# Patient Record
Sex: Female | Born: 1955
Health system: Southern US, Community
[De-identification: ages and names within clinical notes are randomized; demographics above are authoritative.]

## PROBLEM LIST (undated history)

## (undated) DIAGNOSIS — E785 Hyperlipidemia, unspecified: Secondary | ICD-10-CM

## (undated) DIAGNOSIS — I214 Non-ST elevation (NSTEMI) myocardial infarction: Secondary | ICD-10-CM

## (undated) DIAGNOSIS — J40 Bronchitis, not specified as acute or chronic: Secondary | ICD-10-CM

## (undated) DIAGNOSIS — I10 Essential (primary) hypertension: Secondary | ICD-10-CM

## (undated) DIAGNOSIS — G5600 Carpal tunnel syndrome, unspecified upper limb: Secondary | ICD-10-CM

## (undated) DIAGNOSIS — M1990 Unspecified osteoarthritis, unspecified site: Secondary | ICD-10-CM

## (undated) DIAGNOSIS — K219 Gastro-esophageal reflux disease without esophagitis: Secondary | ICD-10-CM

## (undated) DIAGNOSIS — I2510 Atherosclerotic heart disease of native coronary artery without angina pectoris: Secondary | ICD-10-CM

## (undated) DIAGNOSIS — T7840XA Allergy, unspecified, initial encounter: Secondary | ICD-10-CM

## (undated) HISTORY — PX: TONSILLECTOMY: SUR1361

## (undated) HISTORY — DX: Allergy, unspecified, initial encounter: T78.40XA

## (undated) HISTORY — DX: Hyperlipidemia, unspecified: E78.5

## (undated) HISTORY — DX: Bronchitis, not specified as acute or chronic: J40

## (undated) HISTORY — PX: TUBAL LIGATION: SHX77

## (undated) HISTORY — DX: Gastro-esophageal reflux disease without esophagitis: K21.9

## (undated) HISTORY — PX: WRIST SURGERY: SHX841

## (undated) HISTORY — DX: Essential (primary) hypertension: I10

## (undated) HISTORY — DX: Non-ST elevation (NSTEMI) myocardial infarction: I21.4

---

## 2006-07-05 ENCOUNTER — Emergency Department (HOSPITAL_COMMUNITY): Admission: EM | Admit: 2006-07-05 | Discharge: 2006-07-05 | Payer: Self-pay | Admitting: Family Medicine

## 2007-06-14 ENCOUNTER — Emergency Department (HOSPITAL_COMMUNITY): Admission: EM | Admit: 2007-06-14 | Discharge: 2007-06-14 | Payer: Self-pay | Admitting: Emergency Medicine

## 2007-06-14 IMAGING — CR DG CHEST 2V
2 series · 2 of 2 positions shown · non-contrast
Comparison: none

CLINICAL DATA: Cough and sore throat.
 CHEST - 2 VIEW:

[w chest lat]
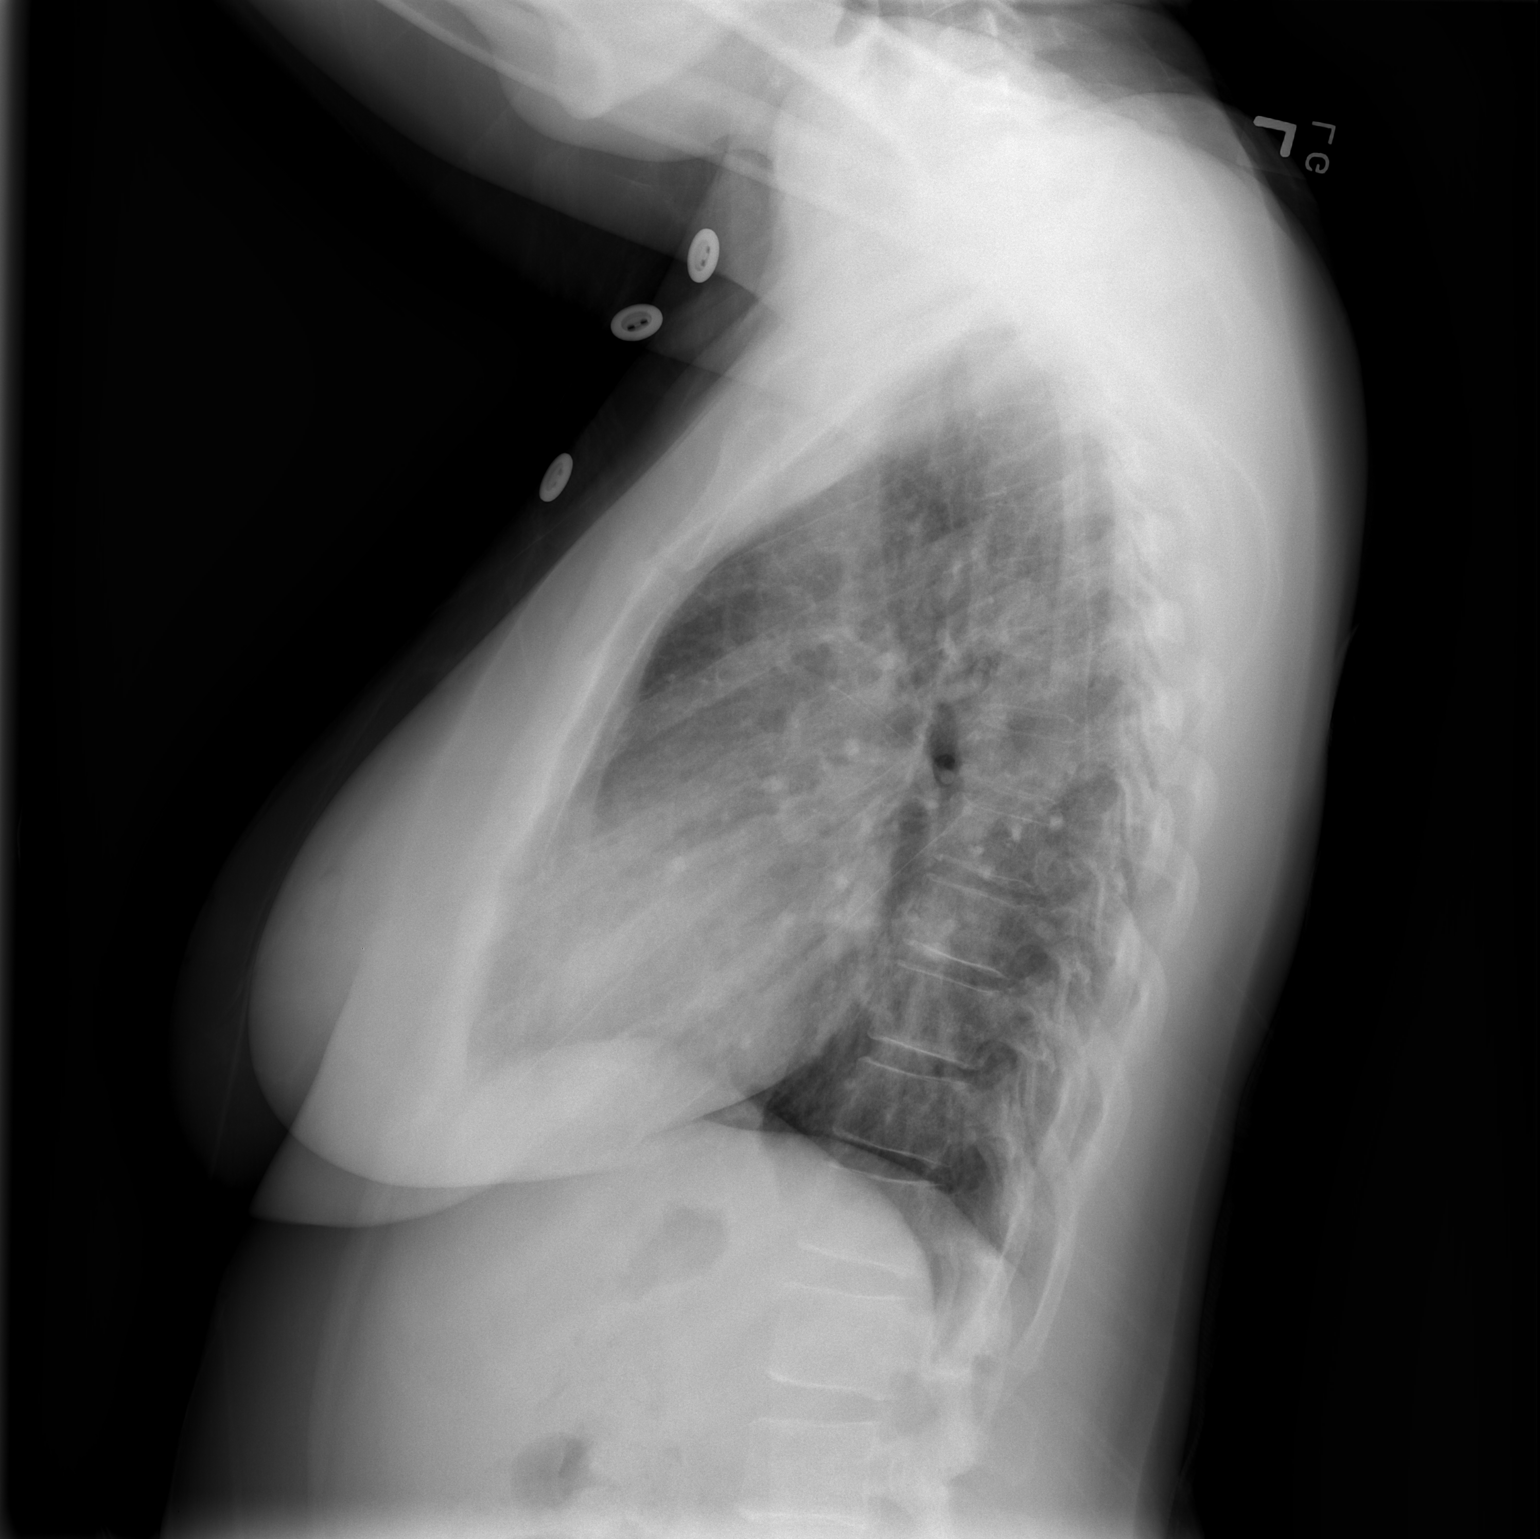

[w chest pa]
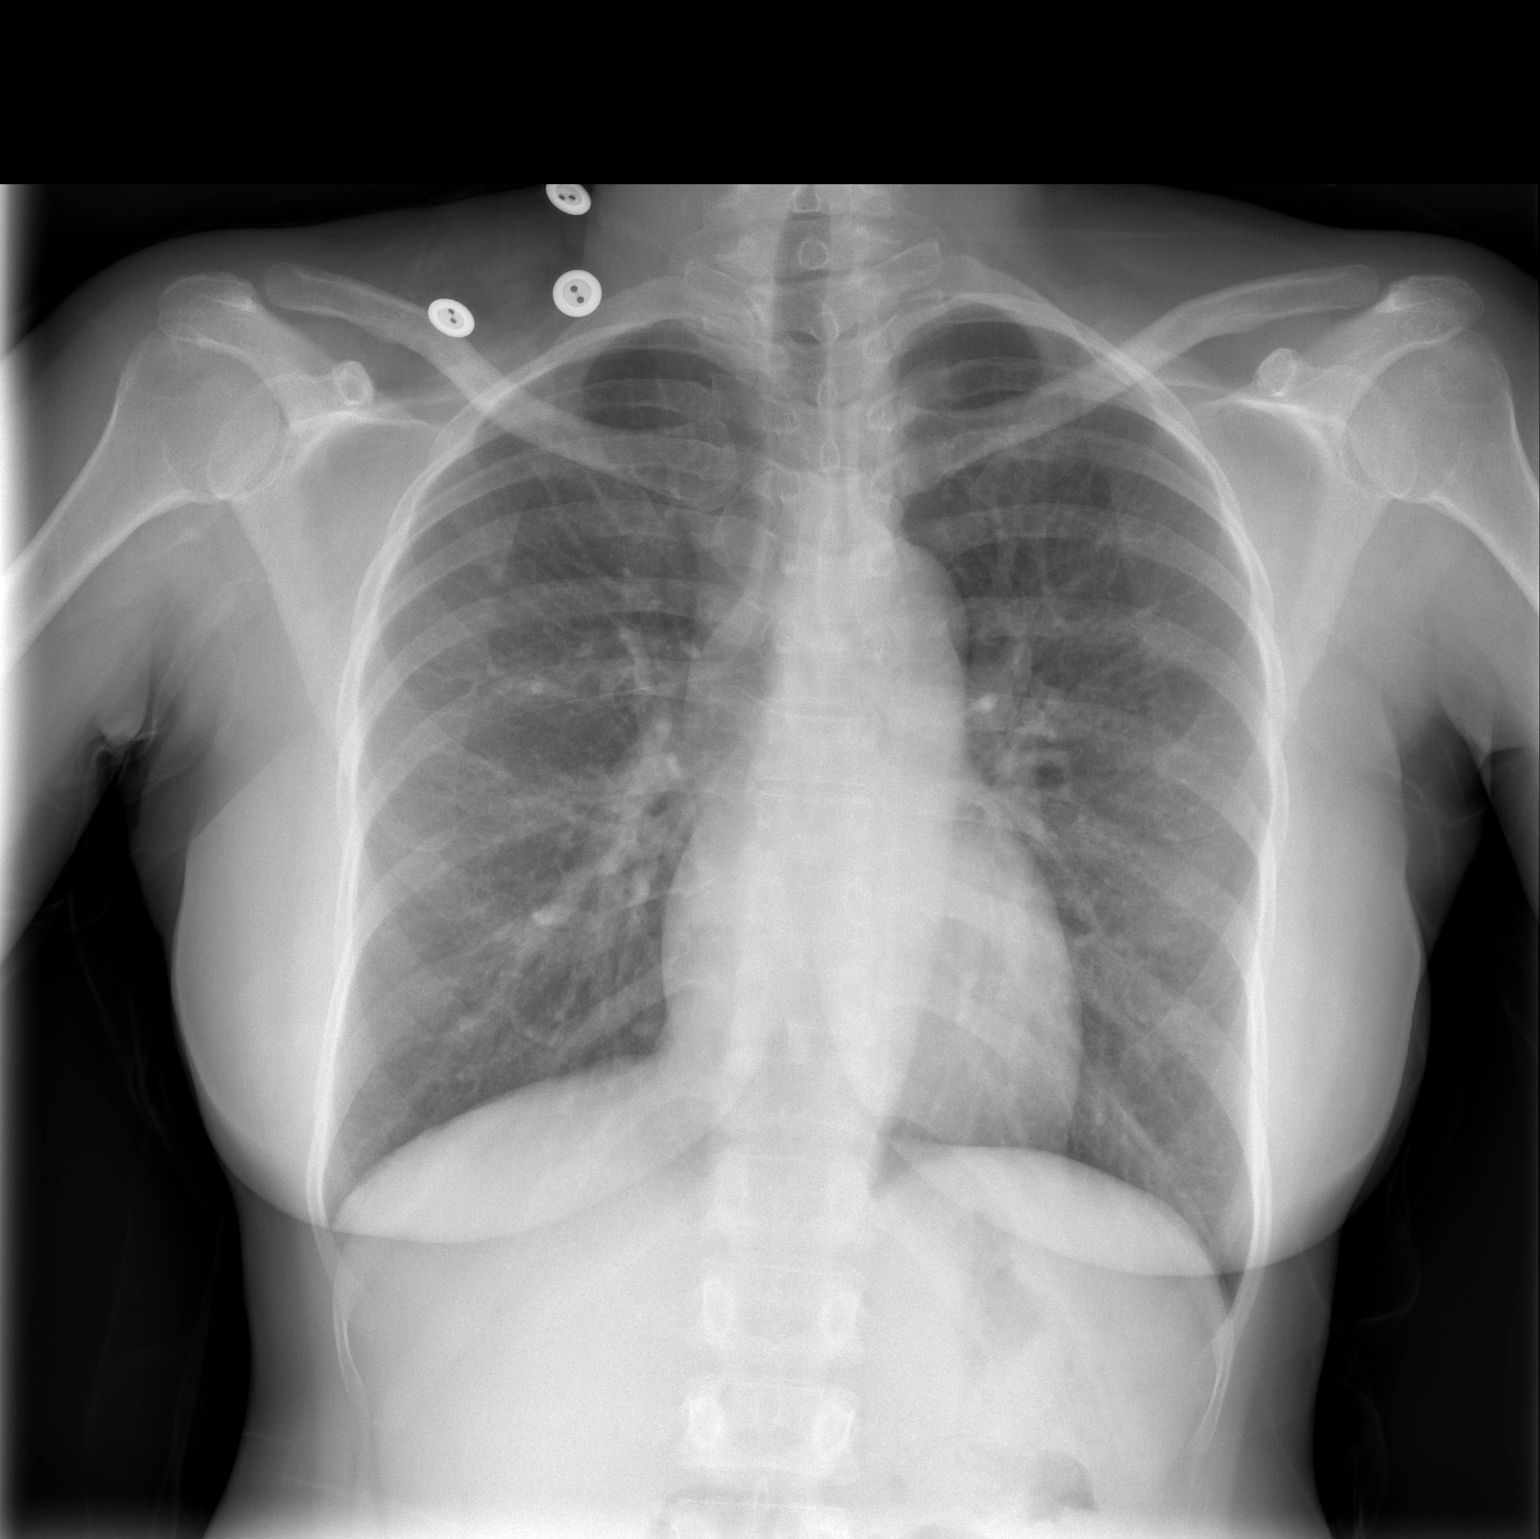

[2 of 2 positions shown; findings below may reference images not displayed]

FINDINGS: The heart size and mediastinal contours are within normal limits.  Both lungs are clear.  The visualized skeletal structures are unremarkable.
IMPRESSION: No active cardiopulmonary disease.

## 2008-04-19 ENCOUNTER — Emergency Department (HOSPITAL_COMMUNITY): Admission: EM | Admit: 2008-04-19 | Discharge: 2008-04-19 | Payer: Self-pay | Admitting: Emergency Medicine

## 2008-04-19 IMAGING — CT CT HEAD W/O CM
1 series · 15 of 30 positions shown, 19 images · non-contrast
Comparison: None

CLINICAL DATA: Headache for 1 week

CT HEAD WITHOUT CONTRAST
TECHNIQUE: Contiguous axial images were obtained from the base of
the skull through the vertex without contrast.

[Series 2: head_seq 4.5 h37s st · axial · 0.43mm/px · z∈[-151,-25]mm · 15 of 32 slices shown, 19 images]
[im 2/32  brain]
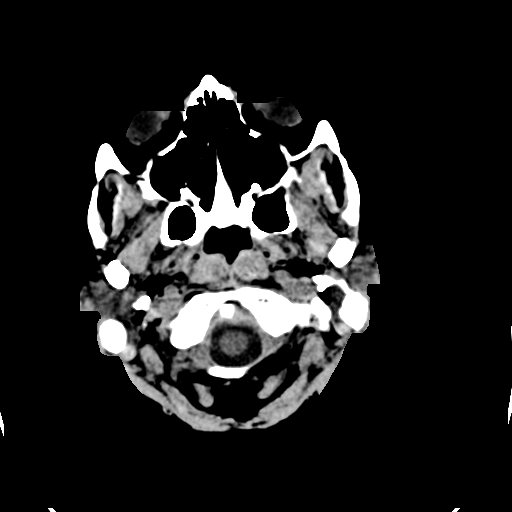
[im 2/32  bone]
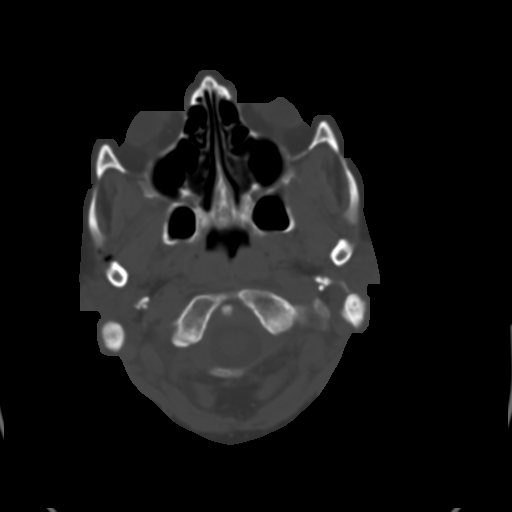
[im 4/32  brain]
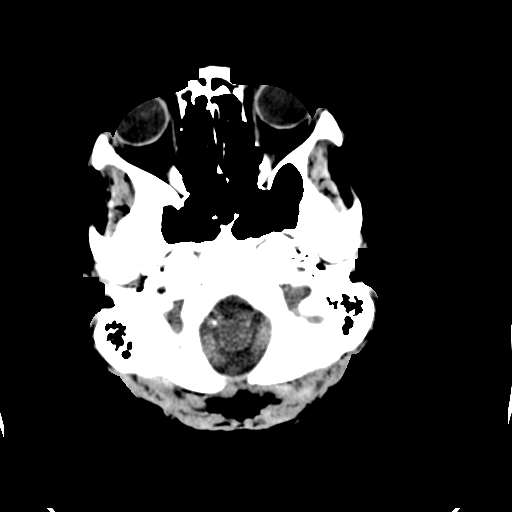
[im 6/32  brain]
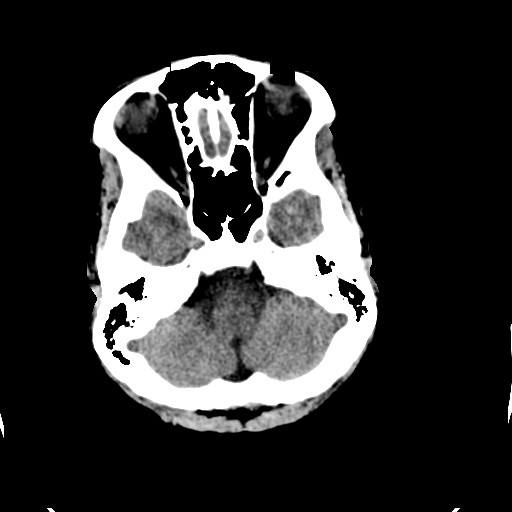
[im 8/32  brain]
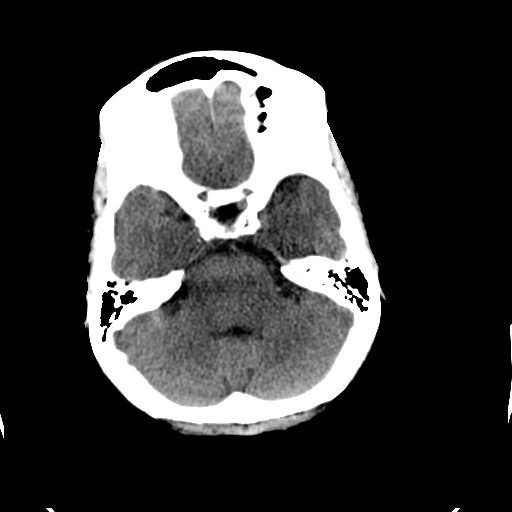
[im 10/32  brain]
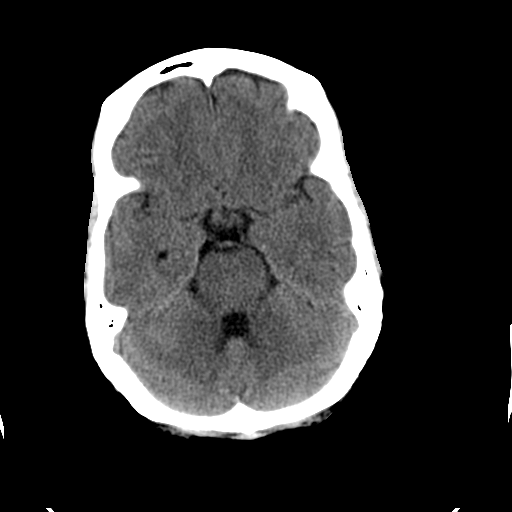
[im 10/32  bone]
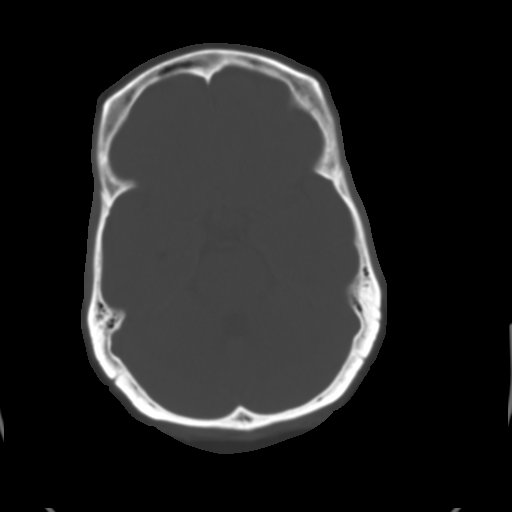
[im 12/32  brain]
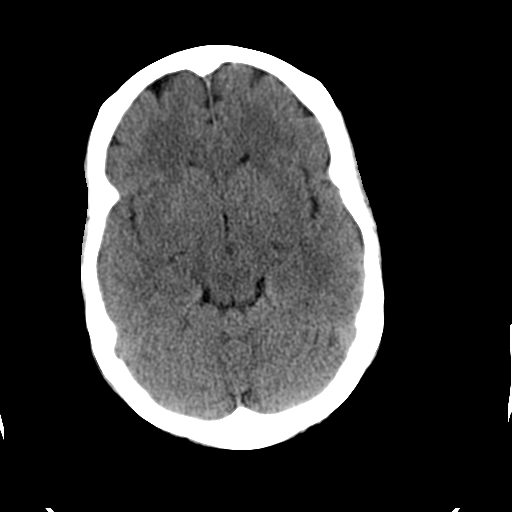
[im 14/32  brain]
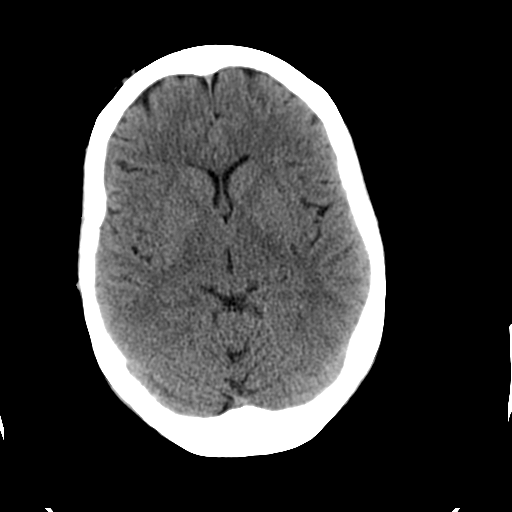
[im 17/32  brain]
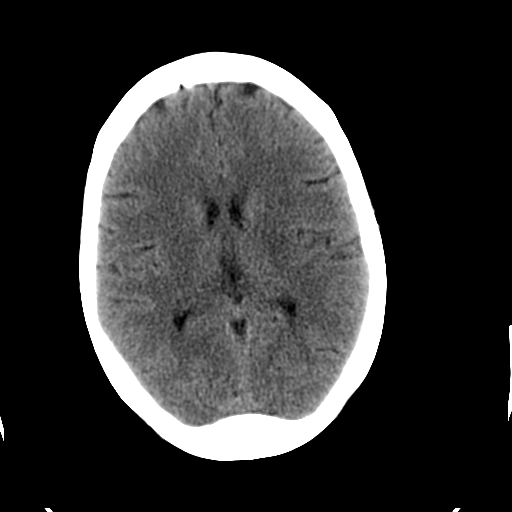
[im 18/32  brain]
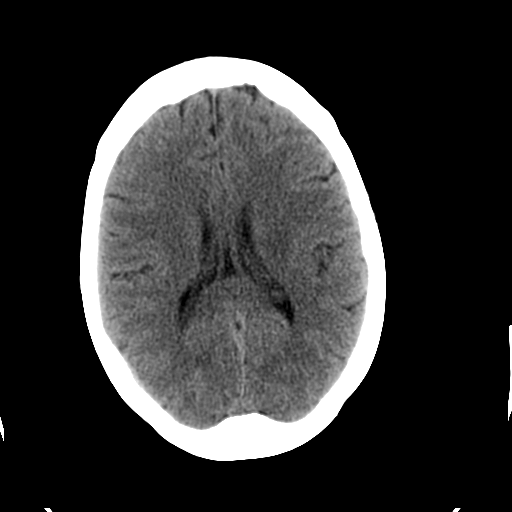
[im 18/32  bone]
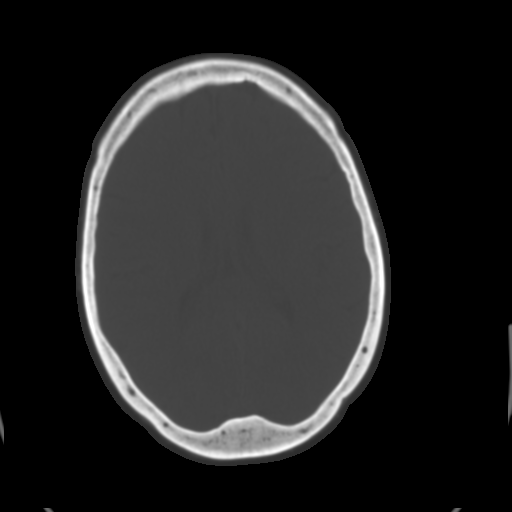
[im 20/32  brain]
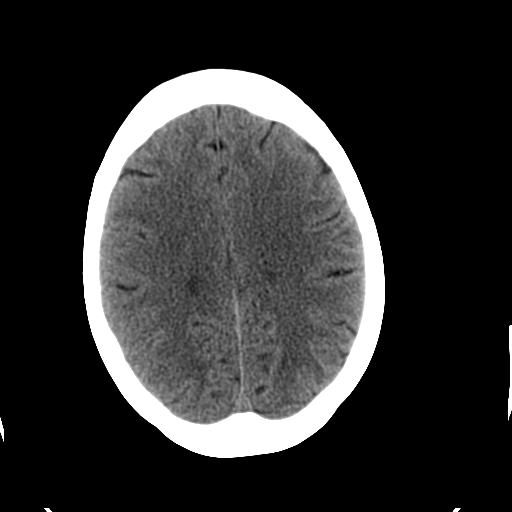
[im 22/32  brain]
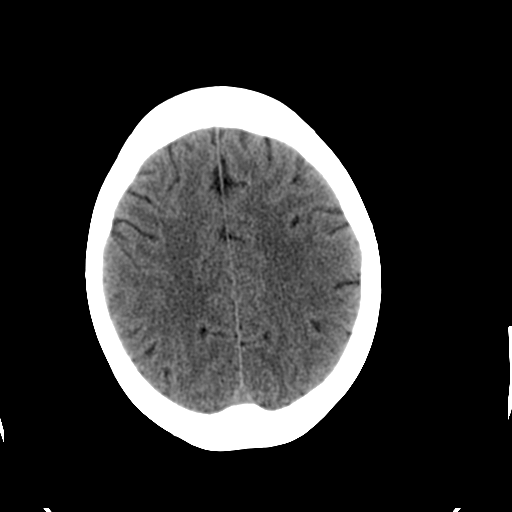
[im 24/32  brain]
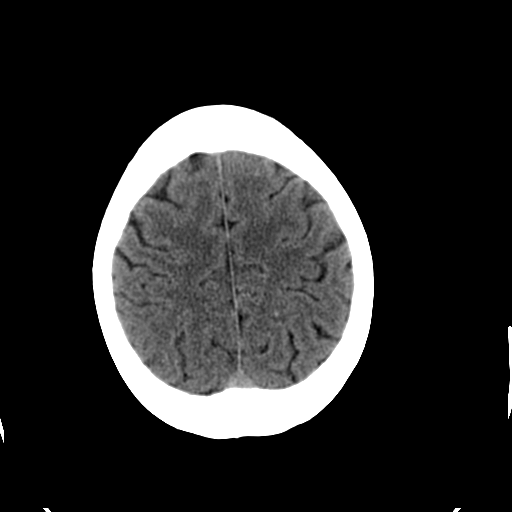
[im 26/32  brain]
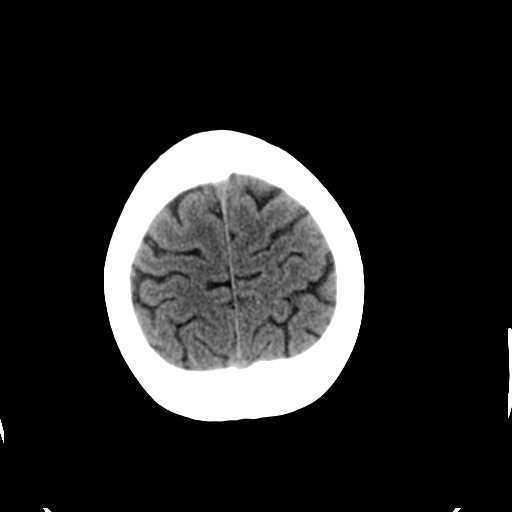
[im 26/32  bone]
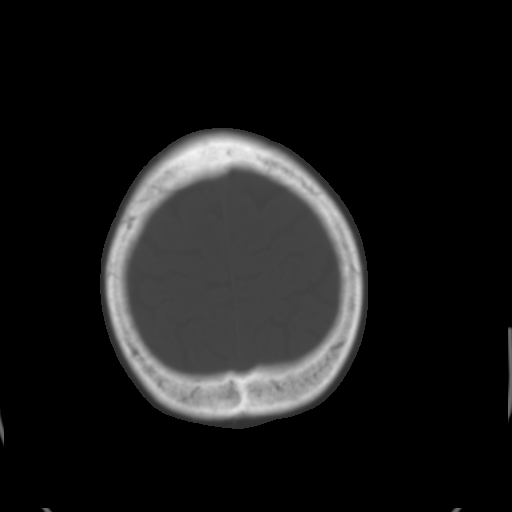
[im 28/32  brain]
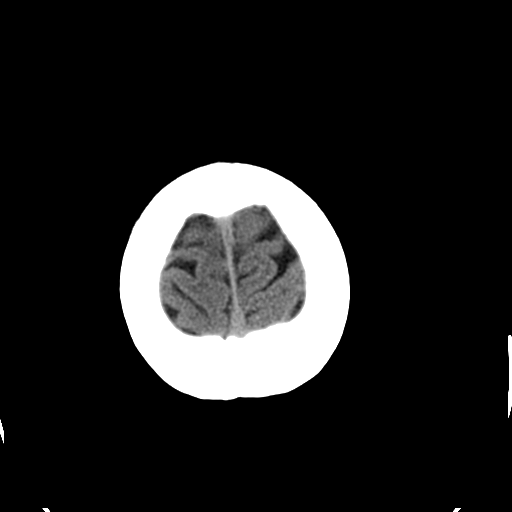
[im 30/32  brain]
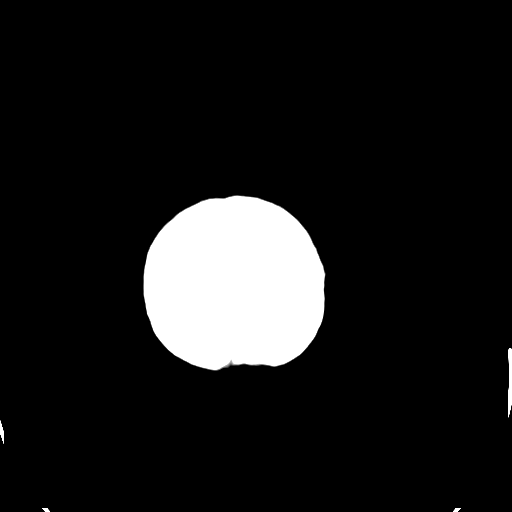

[15 of 30 positions shown; findings below may reference images not displayed]

FINDINGS: No depressed skull fracture is seen.  No intracranial
hemorrhage, mass effect or midline shift.  There is multifocal
small amount of soft tissue air in the right maseter region and
adjacent to right mandible.  A small amount of air is noted
adjacent to right sellar region.  This may be intravascular air
from recent IV access.  If there is history of recent trauma this
may represent air migrated from soft tissue neck or mediastinum.
Clinical correlation is necessary.  Tiny bilateral basal ganglia
calcifications are noted physiologic in nature.  No hydrocephalus.
No intra or extra-axial fluid collection.
IMPRESSION: 1.  No intracranial hemorrhage, mass effect or midline shift.There
is multifocal small amount of soft tissue air in the right maseter
region and adjacent to right mandible.  A small amount of air is
noted adjacent to right sellar region.  This may be intravascular
air from recent IV access.  If there is history of recent trauma
this may represent air migrated from soft tissue neck or
mediastinum.  Clinical correlation is necessary.

## 2008-06-28 ENCOUNTER — Emergency Department (HOSPITAL_COMMUNITY): Admission: EM | Admit: 2008-06-28 | Discharge: 2008-06-28 | Payer: Self-pay | Admitting: Family Medicine

## 2008-08-15 ENCOUNTER — Emergency Department (HOSPITAL_COMMUNITY): Admission: EM | Admit: 2008-08-15 | Discharge: 2008-08-15 | Payer: Self-pay | Admitting: Emergency Medicine

## 2009-06-16 ENCOUNTER — Emergency Department (HOSPITAL_COMMUNITY): Admission: EM | Admit: 2009-06-16 | Discharge: 2009-06-16 | Payer: Self-pay | Admitting: Emergency Medicine

## 2010-08-26 ENCOUNTER — Ambulatory Visit
Admission: RE | Admit: 2010-08-26 | Discharge: 2010-08-26 | Disposition: A | Discharge status: Home / Self Care | Payer: Worker's Compensation | Source: Ambulatory Visit | Attending: Emergency Medicine | Admitting: Emergency Medicine

## 2010-08-26 ENCOUNTER — Other Ambulatory Visit: Payer: Self-pay | Admitting: Emergency Medicine

## 2010-08-26 DIAGNOSIS — R52 Pain, unspecified: Secondary | ICD-10-CM

## 2010-08-26 IMAGING — CR DG HAND COMPLETE 3+V*L*
3 series · 3 of 3 positions shown · non-contrast
Comparison: None.

CLINICAL DATA: Injured hand 3 days ago with pain

LEFT HAND - COMPLETE 3+ VIEW

[view not recorded (1 of 3)]
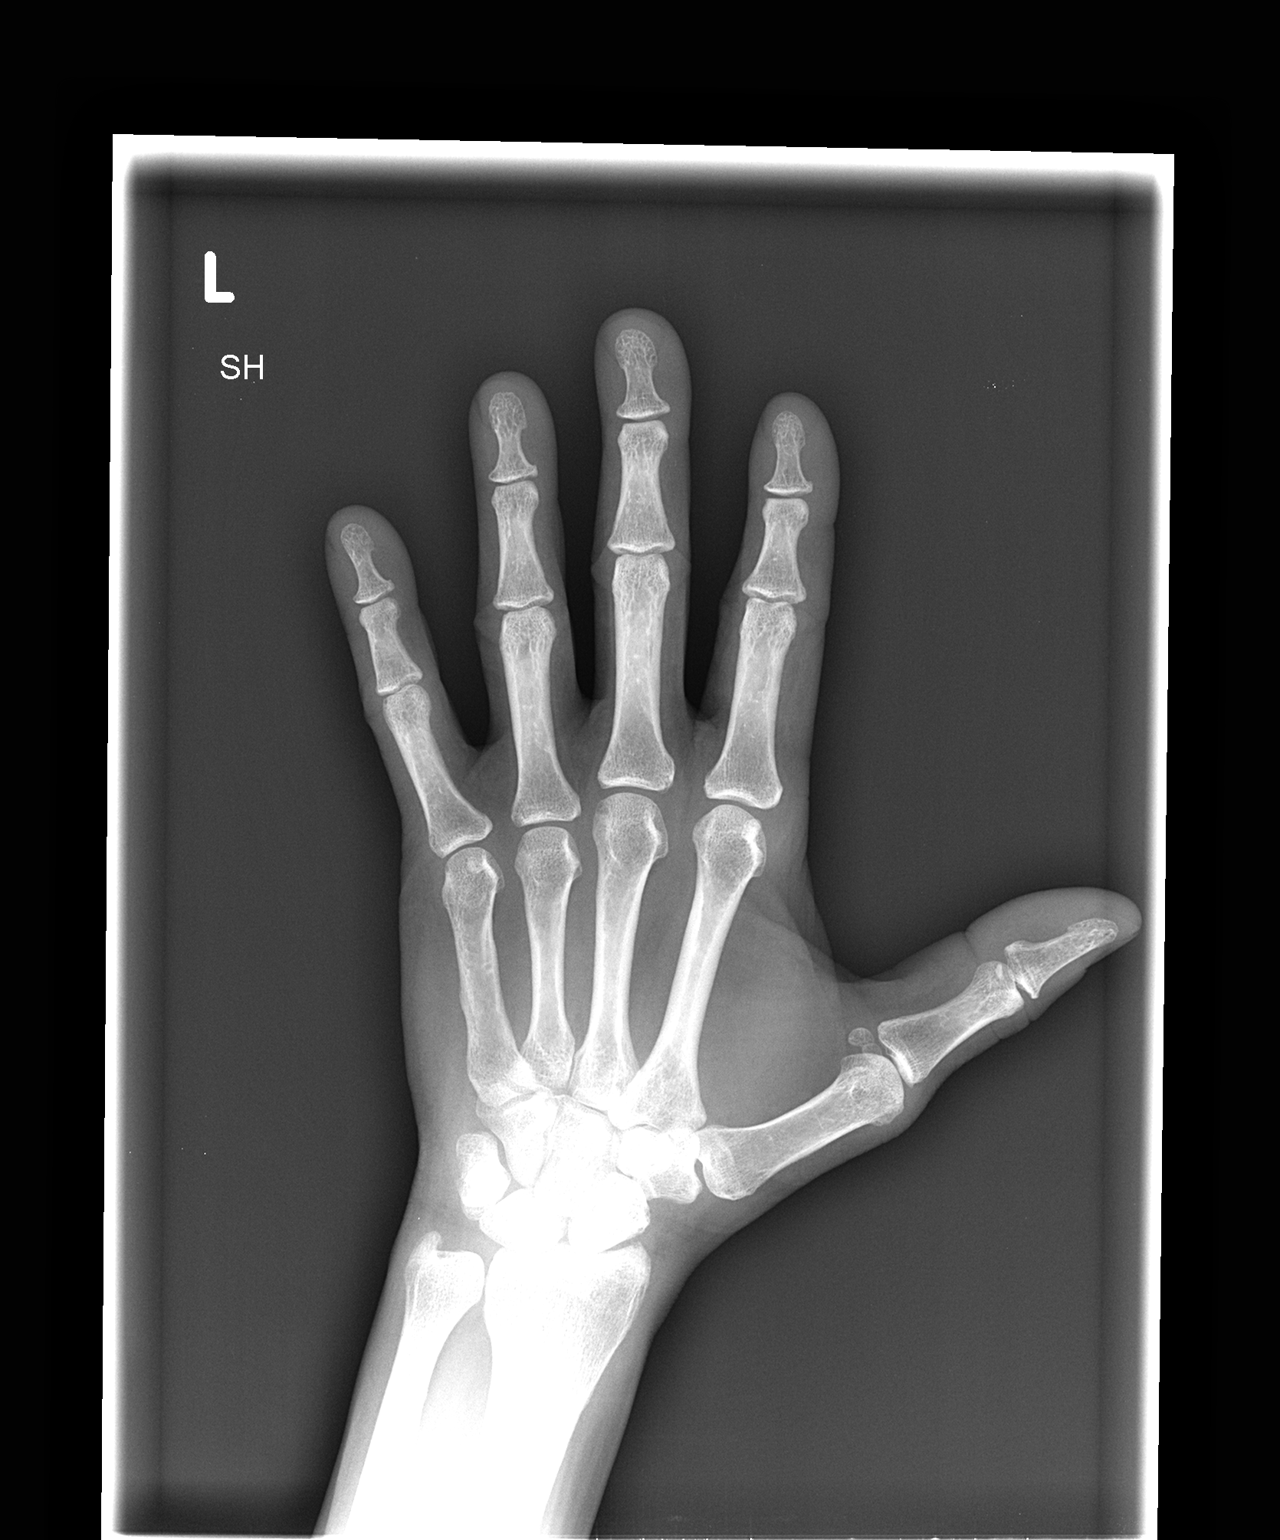

[view not recorded (2 of 3)]
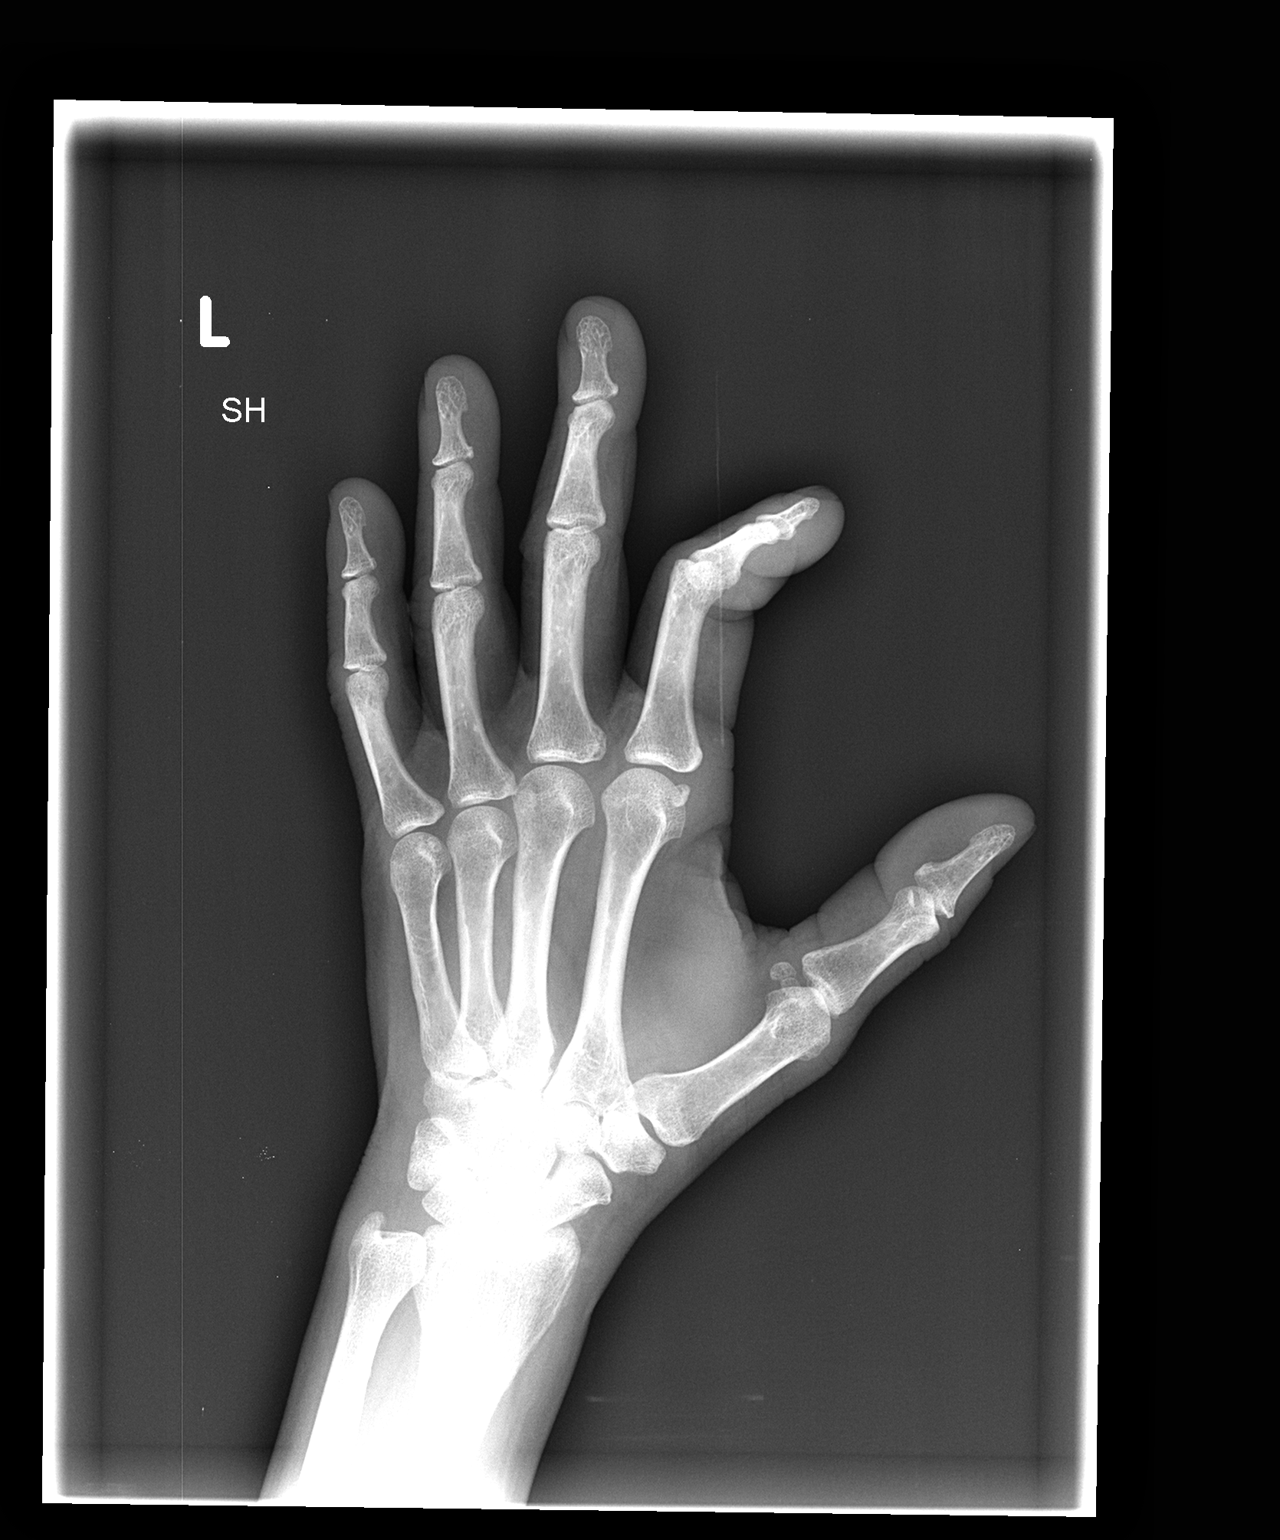

[view not recorded (3 of 3)]
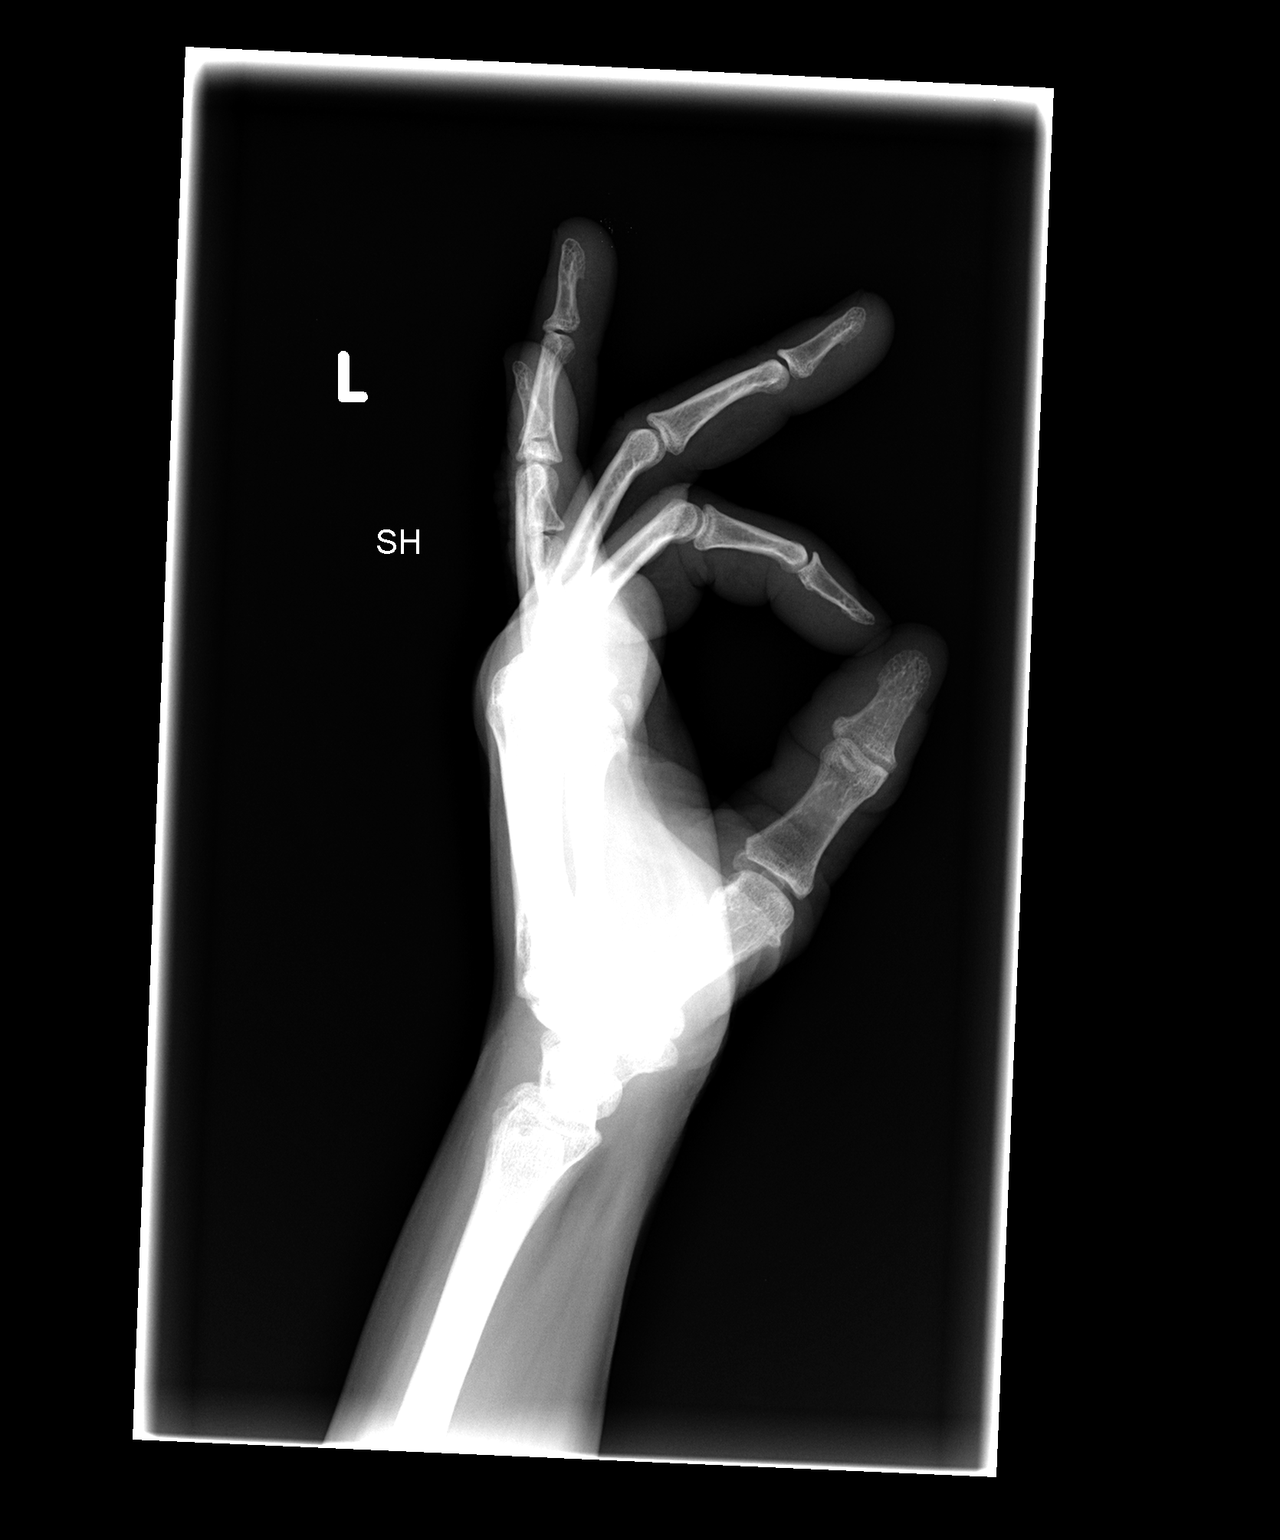

[3 of 3 positions shown; findings below may reference images not displayed]

FINDINGS: No acute fracture is seen.  The radiocarpal joint space
appears normal.  The carpal bones are in normal position.  MCP,
PIP, and DIP joints appear normal.
IMPRESSION: Negative left hand.

## 2010-11-04 ENCOUNTER — Emergency Department (HOSPITAL_COMMUNITY): Payer: BC Managed Care – PPO

## 2010-11-04 ENCOUNTER — Emergency Department (HOSPITAL_COMMUNITY)
Admission: EM | Admit: 2010-11-04 | Discharge: 2010-11-04 | Disposition: A | Discharge status: Home / Self Care | Payer: BC Managed Care – PPO | Attending: Emergency Medicine | Admitting: Emergency Medicine

## 2010-11-04 DIAGNOSIS — R05 Cough: Secondary | ICD-10-CM | POA: Insufficient documentation

## 2010-11-04 DIAGNOSIS — J069 Acute upper respiratory infection, unspecified: Secondary | ICD-10-CM | POA: Insufficient documentation

## 2010-11-04 DIAGNOSIS — J40 Bronchitis, not specified as acute or chronic: Secondary | ICD-10-CM | POA: Insufficient documentation

## 2010-11-04 DIAGNOSIS — R059 Cough, unspecified: Secondary | ICD-10-CM | POA: Insufficient documentation

## 2010-11-04 DIAGNOSIS — J029 Acute pharyngitis, unspecified: Secondary | ICD-10-CM | POA: Insufficient documentation

## 2010-11-04 IMAGING — CR DG CHEST 2V
2 series · 2 of 2 positions shown · non-contrast
Comparison: [DATE]

CLINICAL DATA: Cough, sore throat.

CHEST - 2 VIEW

[w chest pa]
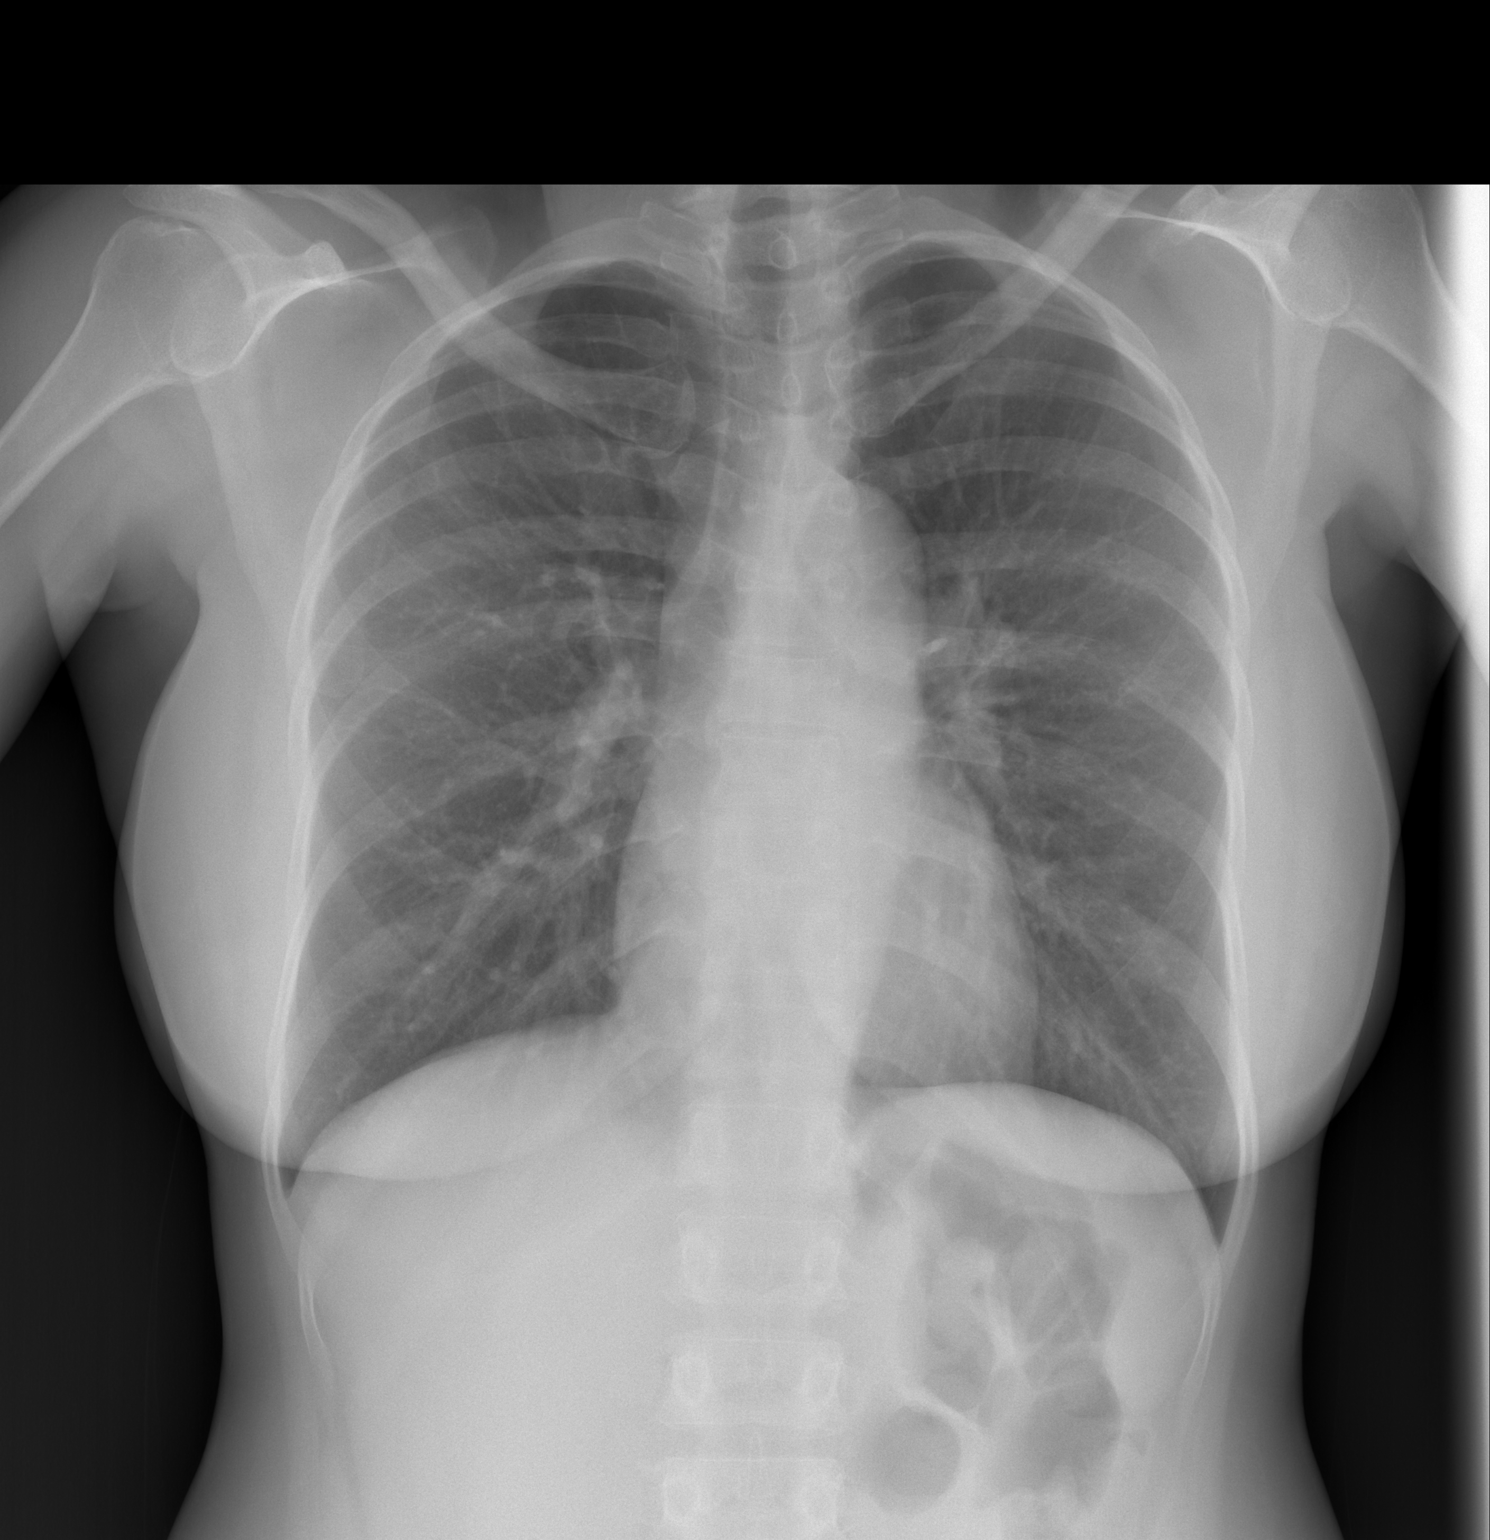

[w chest lat]
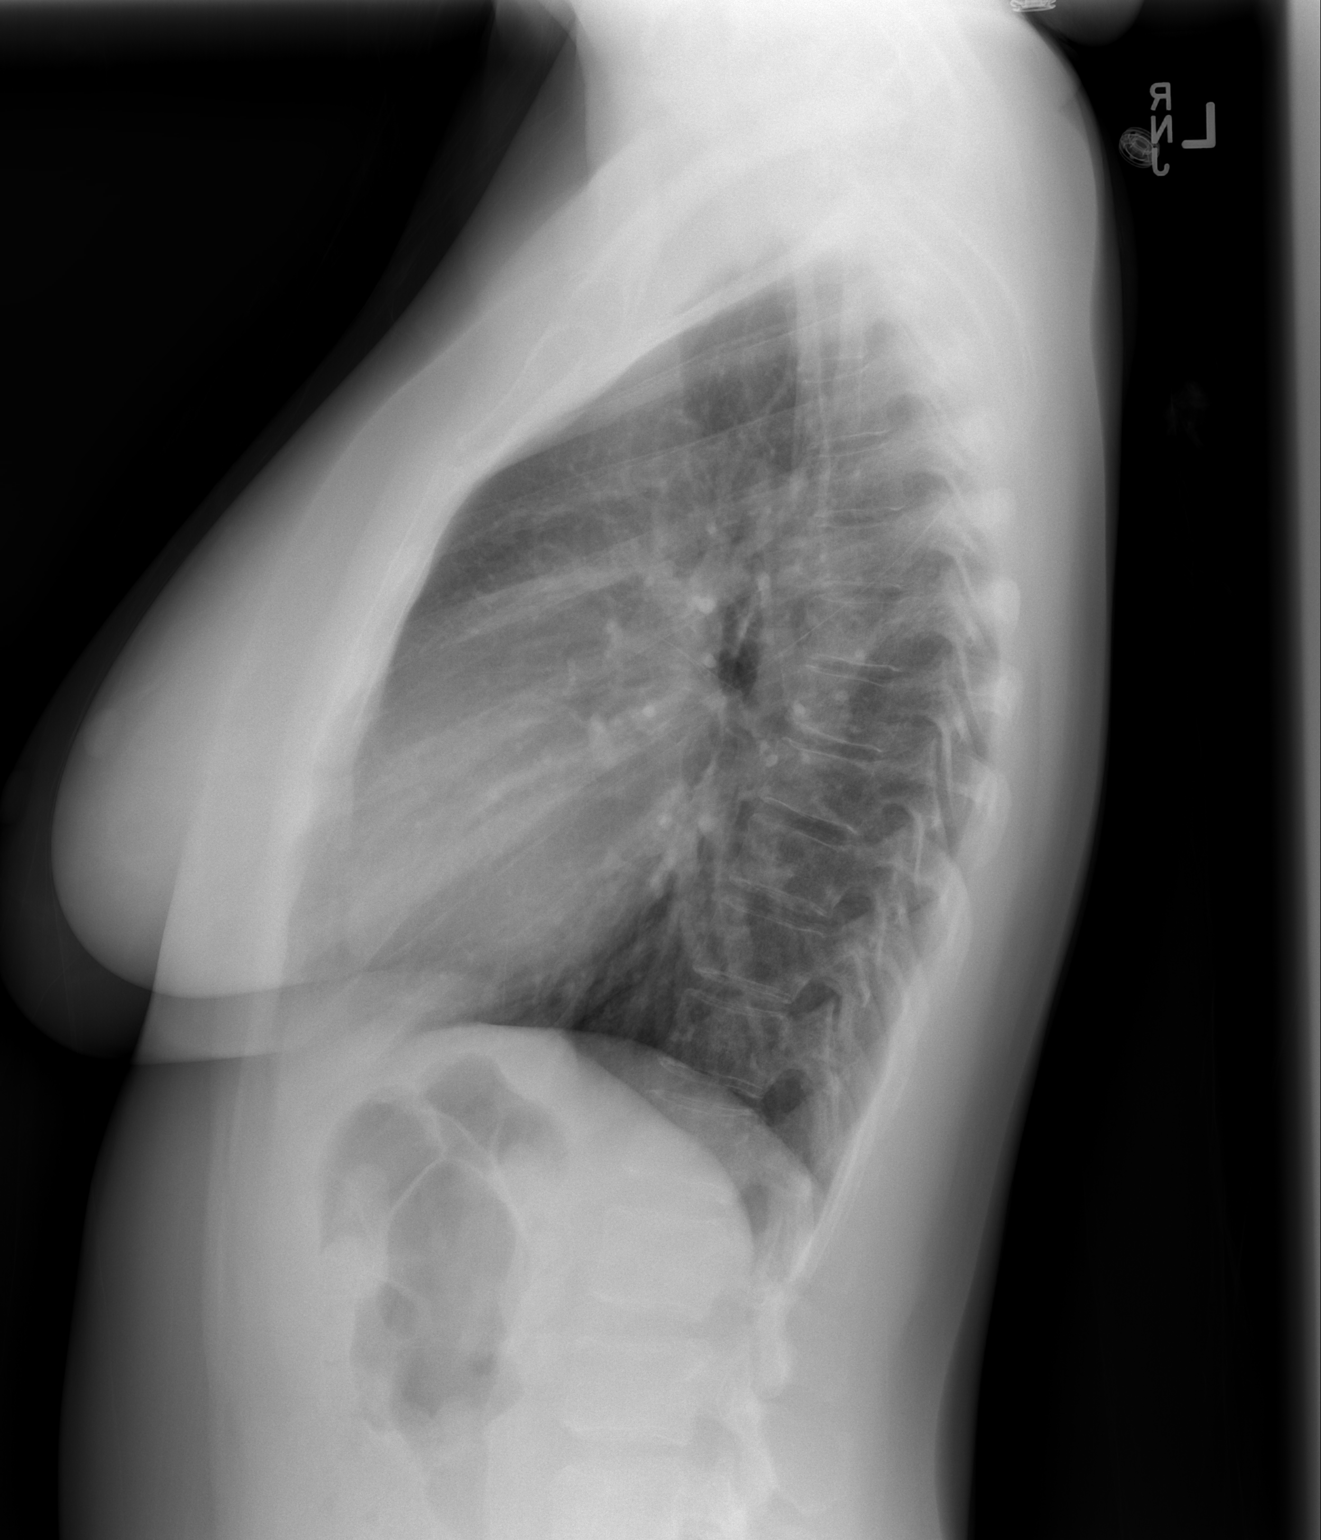

[2 of 2 positions shown; findings below may reference images not displayed]

FINDINGS: Heart and mediastinal contours are within normal limits.
No focal opacities or effusions.  No acute bony abnormality.
IMPRESSION: No active cardiopulmonary disease.

## 2011-02-18 LAB — INFLUENZA A+B VIRUS AG-DIRECT(RAPID): Inflenza A Ag: NEGATIVE

## 2011-03-02 LAB — DIFFERENTIAL
Basophils Relative: 1
Eosinophils Absolute: 0
Lymphs Abs: 1.1
Neutro Abs: 1.3 — ABNORMAL LOW
Neutrophils Relative %: 49

## 2011-03-02 LAB — POCT I-STAT, CHEM 8
Chloride: 104
Creatinine, Ser: 0.8
Hemoglobin: 12.2
Sodium: 140
TCO2: 26

## 2011-03-02 LAB — CBC: RDW: 13.5

## 2012-03-01 ENCOUNTER — Other Ambulatory Visit: Payer: Self-pay | Admitting: Family Medicine

## 2012-03-01 ENCOUNTER — Ambulatory Visit
Admission: RE | Admit: 2012-03-01 | Discharge: 2012-03-01 | Disposition: A | Discharge status: Home / Self Care | Payer: Worker's Compensation | Source: Ambulatory Visit | Attending: Family Medicine | Admitting: Family Medicine

## 2012-03-01 DIAGNOSIS — R52 Pain, unspecified: Secondary | ICD-10-CM

## 2012-03-01 IMAGING — CR DG FOOT COMPLETE 3+V*R*
3 series · 3 of 3 positions shown · non-contrast
Comparison: None.

CLINICAL DATA: Dorsal pain at first metatarsal.

RIGHT FOOT COMPLETE - 3+ VIEW

[t foot ap right]
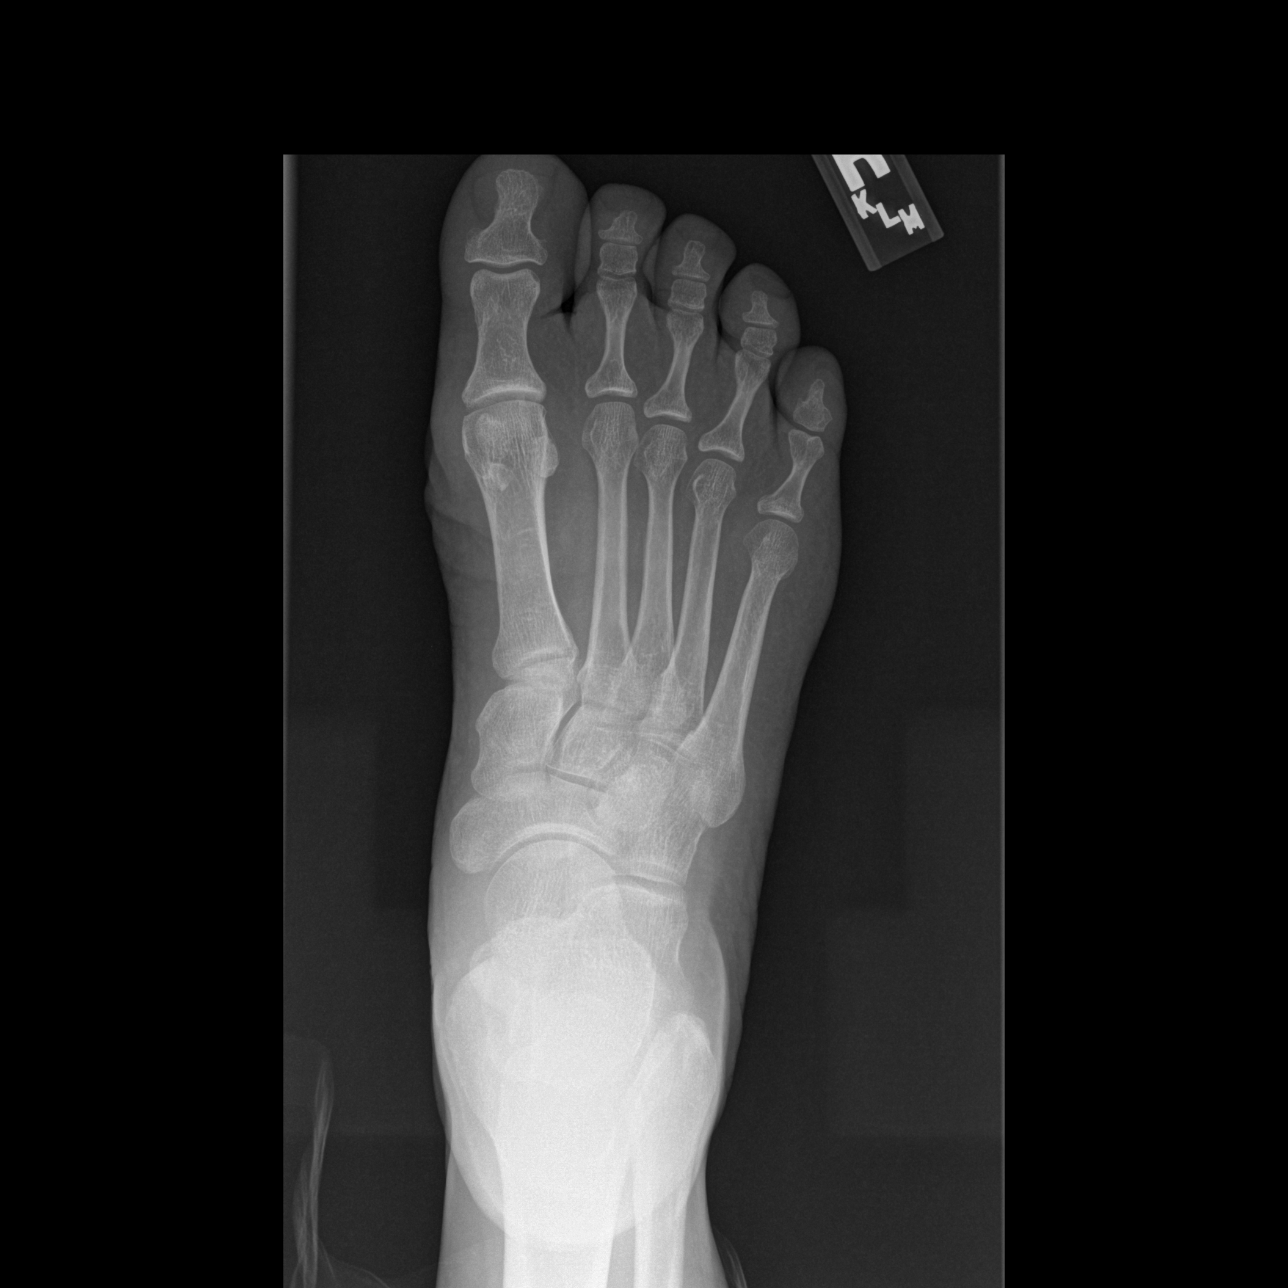

[t foot oblique right]
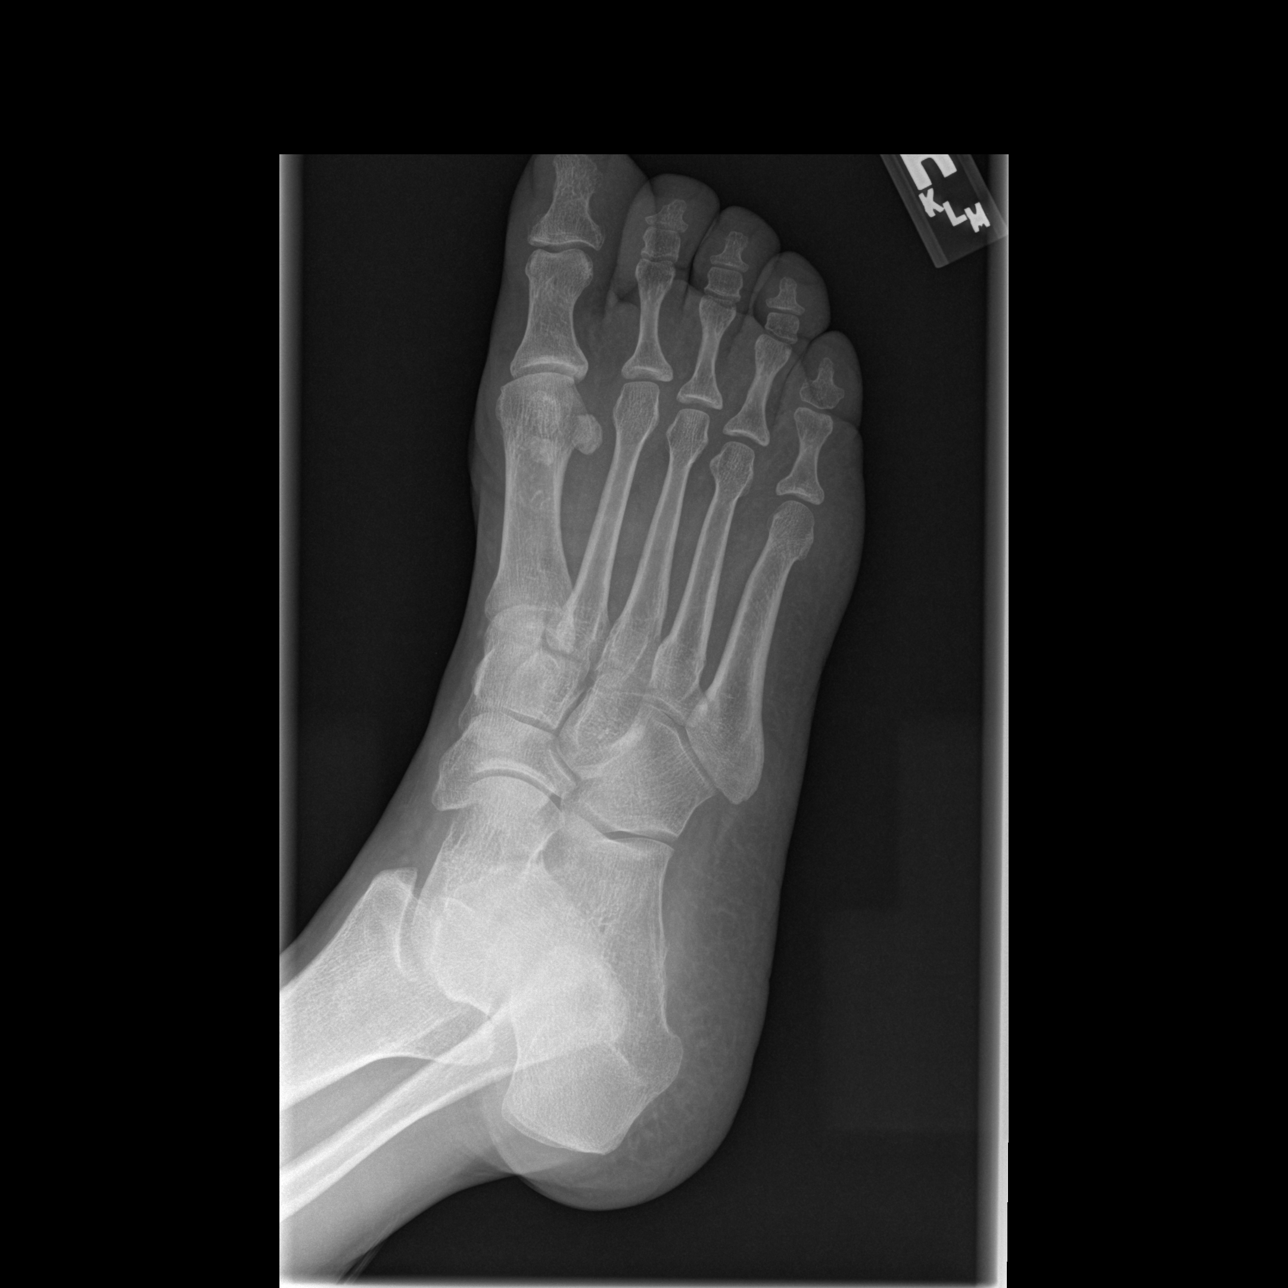

[t foot lat right]
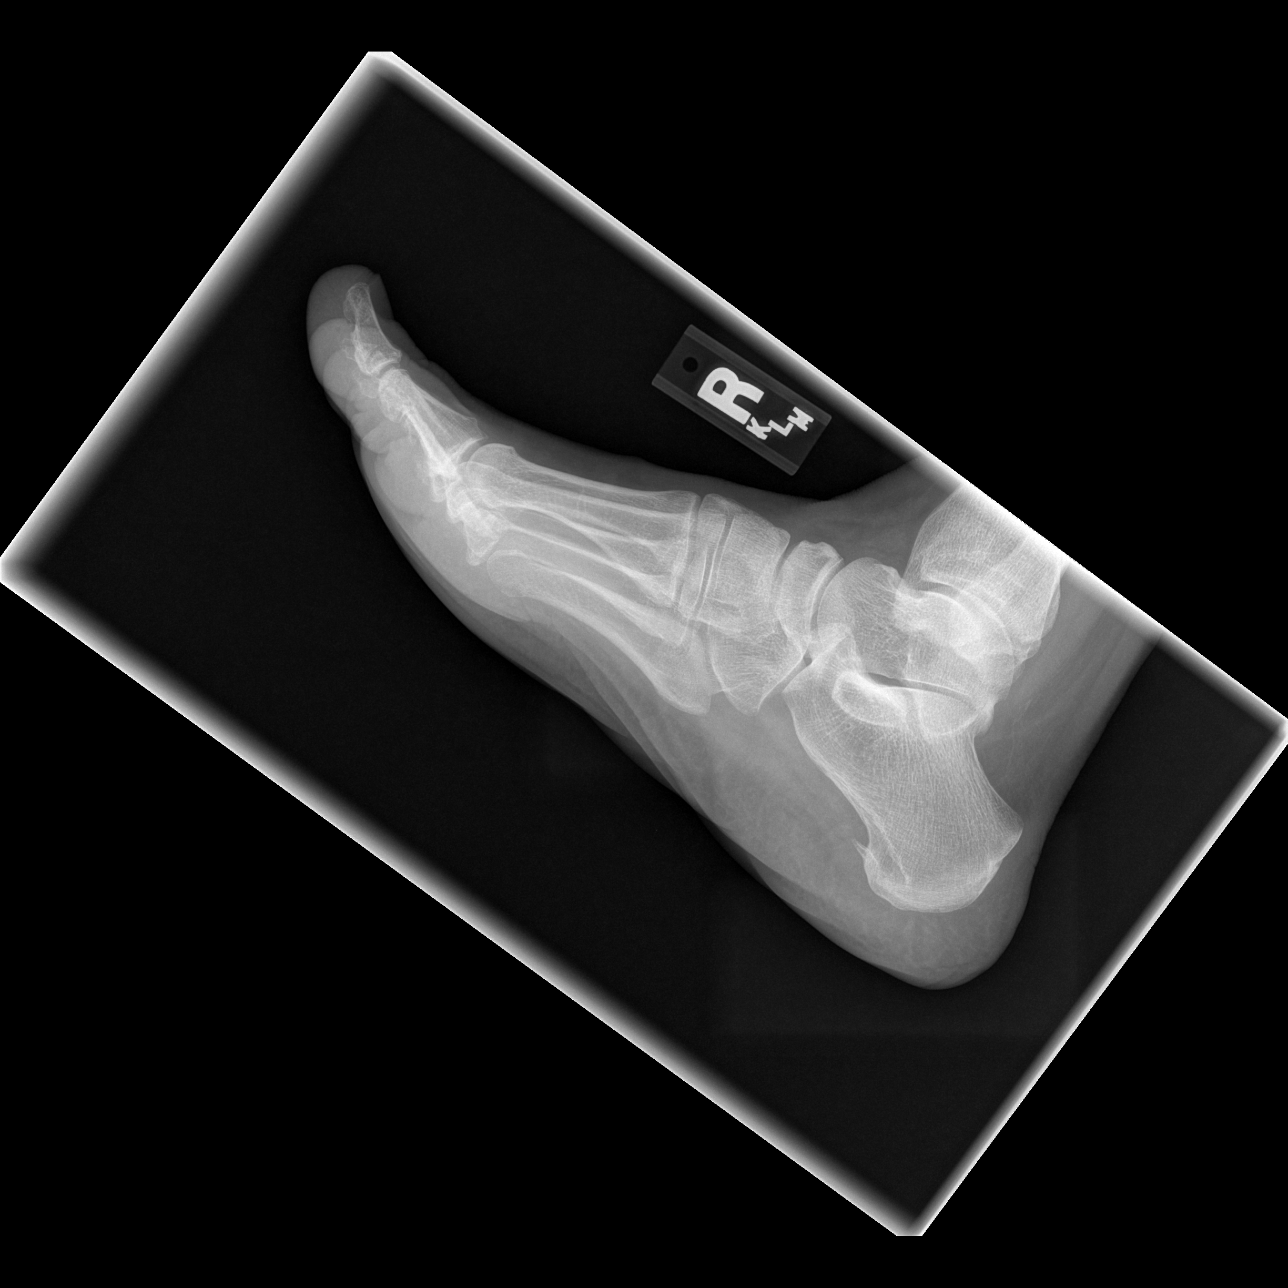

[3 of 3 positions shown; findings below may reference images not displayed]

FINDINGS: Minimal degenerative change at the first metatarsal
phalangeal joint.  No acute fracture.  Plantar calcaneal spur is
noted.
IMPRESSION: Minimal degenerative change at the first metatarsal phalangeal
joint.  No acute findings.

## 2012-04-25 ENCOUNTER — Ambulatory Visit: Payer: BC Managed Care – PPO | Attending: Family Medicine

## 2012-04-25 DIAGNOSIS — IMO0001 Reserved for inherently not codable concepts without codable children: Secondary | ICD-10-CM | POA: Insufficient documentation

## 2012-04-25 DIAGNOSIS — M25569 Pain in unspecified knee: Secondary | ICD-10-CM | POA: Insufficient documentation

## 2012-04-25 DIAGNOSIS — M545 Low back pain, unspecified: Secondary | ICD-10-CM | POA: Insufficient documentation

## 2012-04-25 DIAGNOSIS — M25559 Pain in unspecified hip: Secondary | ICD-10-CM | POA: Insufficient documentation

## 2012-05-02 ENCOUNTER — Ambulatory Visit: Payer: BC Managed Care – PPO | Attending: Family Medicine | Admitting: Physical Therapy

## 2012-05-02 DIAGNOSIS — M25559 Pain in unspecified hip: Secondary | ICD-10-CM | POA: Insufficient documentation

## 2012-05-02 DIAGNOSIS — IMO0001 Reserved for inherently not codable concepts without codable children: Secondary | ICD-10-CM | POA: Insufficient documentation

## 2012-05-02 DIAGNOSIS — M545 Low back pain, unspecified: Secondary | ICD-10-CM | POA: Insufficient documentation

## 2012-05-02 DIAGNOSIS — M25569 Pain in unspecified knee: Secondary | ICD-10-CM | POA: Insufficient documentation

## 2012-05-04 ENCOUNTER — Ambulatory Visit: Payer: BC Managed Care – PPO | Admitting: Physical Therapy

## 2012-05-09 ENCOUNTER — Encounter: Payer: Self-pay | Admitting: Physical Therapy

## 2012-05-11 ENCOUNTER — Encounter: Payer: Self-pay | Admitting: Rehabilitation

## 2012-05-12 ENCOUNTER — Encounter: Payer: Self-pay | Admitting: Physical Therapy

## 2012-05-16 ENCOUNTER — Encounter: Payer: Self-pay | Admitting: Physical Therapy

## 2013-02-12 ENCOUNTER — Ambulatory Visit (INDEPENDENT_AMBULATORY_CARE_PROVIDER_SITE_OTHER): Payer: BC Managed Care – PPO | Admitting: Family Medicine

## 2013-02-12 VITALS — BP 130/80 | HR 72 | Temp 98.0°F | Resp 18 | Ht 62.5 in | Wt 127.8 lb

## 2013-02-12 DIAGNOSIS — Z23 Encounter for immunization: Secondary | ICD-10-CM

## 2013-02-12 DIAGNOSIS — M7741 Metatarsalgia, right foot: Secondary | ICD-10-CM

## 2013-02-12 DIAGNOSIS — M542 Cervicalgia: Secondary | ICD-10-CM

## 2013-02-12 DIAGNOSIS — M7750 Other enthesopathy of unspecified foot: Secondary | ICD-10-CM

## 2013-02-12 MED ORDER — TRAMADOL HCL 50 MG PO TABS: 50.0000 mg | ORAL_TABLET | Freq: Three times a day (TID) | ORAL | Status: DC | PRN

## 2013-02-12 MED ORDER — PREDNISONE 20 MG PO TABS: ORAL_TABLET | ORAL | Status: DC

## 2013-02-12 NOTE — Patient Instructions (Signed)
Tendinitis Tendinitis is swelling and inflammation of the tendons. Tendons are band-like tissues that connect muscle to bone. Tendinitis commonly occurs in the:   Shoulders (rotator cuff).  Heels (Achilles tendon).  Elbows (triceps tendon). CAUSES Tendinitis is usually caused by overusing the tendon, muscles, and joints involved. When the tissue surrounding a tendon (synovium) becomes inflamed, it is called tenosynovitis. Tendinitis commonly develops in people whose jobs require repetitive motions. SYMPTOMS  Pain.  Tenderness.  Mild swelling. DIAGNOSIS Tendinitis is usually diagnosed by physical exam. Your caregiver may also order X-rays or other imaging tests. TREATMENT Your caregiver may recommend certain medicines or exercises for your treatment. HOME CARE INSTRUCTIONS   Use a sling or splint for as long as directed by your caregiver until the pain decreases.  Put ice on the injured area.  Put ice in a plastic bag.  Place a towel between your skin and the bag.  Leave the ice on for 15-20 minutes, 3-4 times a day.  Avoid using the limb while the tendon is painful. Perform gentle range of motion exercises only as directed by your caregiver. Stop exercises if pain or discomfort increase, unless directed otherwise by your caregiver.  Only take over-the-counter or prescription medicines for pain, discomfort, or fever as directed by your caregiver. SEEK MEDICAL CARE IF:   Your pain and swelling increase.  You develop new, unexplained symptoms, especially increased numbness in the hands. MAKE SURE YOU:   Understand these instructions.  Will watch your condition.  Will get help right away if you are not doing well or get worse. Document Released: 05/14/2000 Document Revised: 08/09/2011 Document Reviewed: 08/03/2010 [facility] Patient Information 2014 [place], [place]. Myalgia, Adult Myalgia is the medical term for muscle pain. It is a symptom of many things. Nearly everyone  at some time in their life has this. The most common cause for muscle pain is overuse or straining and more so when you are not in shape. Injuries and muscle bruises cause myalgias. Muscle pain without a history of injury can also be caused by a virus. It frequently comes along with the flu. Myalgia not caused by muscle strain can be present in a large number of infectious diseases. Some autoimmune diseases like lupus and fibromyalgia can cause muscle pain. Myalgia may be mild, or severe. SYMPTOMS  The symptoms of myalgia are simply muscle pain. Most of the time this is short lived and the pain goes away without treatment. DIAGNOSIS  Myalgia is diagnosed by your caregiver by taking your history. This means you tell him when the problems began, what they are, and what has been happening. If this has not been a long term problem, your caregiver may want to watch for a while to see what will happen. If it has been long term, they may want to do additional testing. TREATMENT  The treatment depends on what the underlying cause of the muscle pain is. Often anti-inflammatory medications will help. HOME CARE INSTRUCTIONS  If the pain in your muscles came from overuse, slow down your activities until the problems go away.  Myalgia from overuse of a muscle can be treated with alternating hot and cold packs on the muscle affected or with cold for the first couple days. If either heat or cold seems to make things worse, stop their use.  Apply ice to the sore area for 15-20 minutes, 3-4 times per day, while awake for the first 2 days of muscle soreness, or as directed. Put the ice in a plastic  bag and place a towel between the bag of ice and your skin.  Only take over-the-counter or prescription medicines for pain, discomfort, or fever as directed by your caregiver.  Regular gentle exercise may help if you are not active.  Stretching before strenuous exercise can help lower the risk of myalgia. It is normal  when beginning an exercise regimen to feel some muscle pain after exercising. Muscles that have not been used frequently will be sore at first. If the pain is extreme, this may mean injury to a muscle. SEEK MEDICAL CARE IF:  You have an increase in muscle pain that is not relieved with medication.  You begin to run a temperature.  You develop nausea and vomiting.  You develop a stiff and painful neck.  You develop a rash.  You develop muscle pain after a tick bite.  You have continued muscle pain while working out even after you are in good condition. SEEK IMMEDIATE MEDICAL CARE IF: Any of your problems are getting worse and medications are not helping. MAKE SURE YOU:   Understand these instructions.  Will watch your condition.  Will get help right away if you are not doing well or get worse. Document Released: 04/08/2006 Document Revised: 08/09/2011 Document Reviewed: 06/28/2006 [facility] Patient Information 2014 [place], [place].
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
## 2013-03-02 ENCOUNTER — Encounter: Payer: Self-pay | Admitting: Physician Assistant

## 2013-03-02 ENCOUNTER — Ambulatory Visit (INDEPENDENT_AMBULATORY_CARE_PROVIDER_SITE_OTHER): Payer: BC Managed Care – PPO | Admitting: Emergency Medicine

## 2013-03-02 VITALS — BP 118/68 | HR 78 | Temp 98.1°F | Resp 16 | Ht 62.0 in | Wt 129.4 lb

## 2013-03-02 DIAGNOSIS — B009 Herpesviral infection, unspecified: Secondary | ICD-10-CM

## 2013-03-02 MED ORDER — VALACYCLOVIR HCL 1 G PO TABS: 1000.0000 mg | ORAL_TABLET | Freq: Three times a day (TID) | ORAL | Status: DC

## 2013-03-02 MED ORDER — TRAMADOL HCL 50 MG PO TABS: 50.0000 mg | ORAL_TABLET | Freq: Three times a day (TID) | ORAL | Status: DC | PRN

## 2013-03-02 NOTE — Progress Notes (Signed)
Subjective:    Patient ID: Diana [name], female    DOB: [birth year], 57 y.o.   MRN: [id number]  HPI   Diana [name] is a very pleasant 57 yr old female here with concern for an eye injury.  Was cleaning out a vent in her apartment four days ago, some stuff got into her eye and hit below her eye.  She washed her face quickly.  Didn't think anything was in her eye but used the eye wash at work anyway.  States she has pain around the eye, esp lower portion.  Pain on left side of face.  No HA or dizziness.  Slightly blurred vision but no photophobia, tearing, discharge.  Discomfort when she moves the eye laterally.  The area below her eye is currently red and painful, also burning, some itching.  Has been using vaseline.  Has also cleaned with alcohol.  She does not wear contact lenses.  No associated symptoms.  No fever, chills. No prior history of cold sores or known HSV infection.  No prior shingles.   No sick contacts.  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     Review of Systems  Constitutional: Negative for fever and chills.  HENT: Negative.   Eyes: Positive for pain (lower eyelid) and visual disturbance (slightly blurred). Negative for photophobia, discharge, redness and itching.  Respiratory: Negative.   Cardiovascular: Negative.   Gastrointestinal: Negative.   Musculoskeletal: Negative.   Skin: Positive for rash (lower eyelid).  Neurological: Negative.        Objective:   Physical Exam  Vitals reviewed. Constitutional: She is oriented to person, place, and time. She appears well-developed and well-nourished. No distress.  HENT:  Head: Normocephalic and atraumatic.  Eyes: EOM are normal. Pupils are equal, round, and reactive to light. Right eye exhibits no discharge and no hordeolum. No foreign body present in the right eye. Left eye exhibits no discharge and no hordeolum. No foreign body present in the left eye. Right conjunctiva is not injected.  Left conjunctiva is not injected.    Cluster of vesicles on an erythematous base at left lower eyelid appears c/w HSV infection; on fluorescein exam no increased uptake, no foreign body  Neck: Neck supple.  Lymphadenopathy:    She has no cervical adenopathy.  Neurological: She is alert and oriented to person, place, and time.  Skin: Skin is warm and dry.  Psychiatric: She has a normal mood and affect. Her behavior is normal.    Visual Acuity Screening   Right eye Left eye Both eyes  Without correction: 20/40 -1 20/40 -1 20/40 -1  With correction:           Assessment & Plan:  HSV infection - Plan: Herpes simplex virus culture, valACYclovir (VALTREX) 1000 MG tablet, CANCELED: HSV(herpes simplex vrs) 1+2 ab-IgG   Diana [name] is a very pleasant 57 yr old female here with a vesicular rash below her left eye.  Clinically this appears to be HSV infection.  On fluorescein exam, there does not appear to be involvement of the cornea or sclera.  Visual acuity 20/40 bilat.  Viral culture obtained.  Will start Valtrex 1g TID x 7 days.  Tramadol prn pain.  Given proximity to the eye, pt needs further eval by ophtho.  Pt to proceed directly to Dr. Laruth Bouchard office.  Will keep pt out of work for the next 4 days as she works in a [employer].  Case discussed with Dr. Cleta Alberts

## 2013-03-02 NOTE — Patient Instructions (Addendum)
Dr. Dione Booze [address], [place], [place] [address] [phone]   Begin taking the valacyclovir three times daily.

## 2013-03-05 ENCOUNTER — Telehealth: Payer: Self-pay

## 2013-03-05 LAB — HERPES SIMPLEX VIRUS CULTURE: Organism ID, Bacteria: DETECTED

## 2013-03-05 NOTE — Telephone Encounter (Signed)
She can use vaseline if that helps it feel better.  Be sure to wash hands after applying.  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  Please see lab note and give her the message there, thanks!

## 2013-03-05 NOTE — Telephone Encounter (Signed)
Pt notified of results

## 2013-03-05 NOTE — Telephone Encounter (Signed)
Pt requesting topical cream for face rash  Best phone is [phone]  [facility]

## 2013-03-05 NOTE — Telephone Encounter (Signed)
The valacyclovir that she is taking by mouth is the treatment for her rash.  There is no topical treatment.  I can send some topical pain relieving gel if she would like

## 2013-03-05 NOTE — Telephone Encounter (Signed)
Pt states that her face is itching and is dry which is making it irritable and she would like something for that or if she just can put Vaseline on it.  She was also asking about labs.

## 2013-04-25 ENCOUNTER — Ambulatory Visit (INDEPENDENT_AMBULATORY_CARE_PROVIDER_SITE_OTHER): Payer: BC Managed Care – PPO | Admitting: Family Medicine

## 2013-04-25 VITALS — BP 124/84 | HR 84 | Temp 98.8°F | Resp 20 | Ht 62.25 in | Wt 130.6 lb

## 2013-04-25 DIAGNOSIS — M436 Torticollis: Secondary | ICD-10-CM

## 2013-04-25 MED ORDER — TRAMADOL HCL 50 MG PO TABS: 50.0000 mg | ORAL_TABLET | Freq: Three times a day (TID) | ORAL | Status: DC | PRN

## 2013-04-25 MED ORDER — METHYLPREDNISOLONE 4 MG PO KIT: PACK | ORAL | Status: DC

## 2013-04-25 NOTE — Progress Notes (Signed)
Subjective:    Patient ID: Diana [name], female    DOB: 11-10-1955, 57 y.o.   MRN: [id number]  HPI This chart was scribed for Kenyon Ana Lauenstein-MD, by Ladona Ridgel Tommaso Cavitt, Scribe. This patient was seen in room 14 and the patient's care was started at 5:42 PM.  HPI Comments: Diana [name] is a 57 y.o. female who lives at home w/her family.  Today she presents to the [facility] complaining of constant, gradually worsening neck/shoulder tightness that radiates to numbness in her hands, onset 2 weeks ago. 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 She reports pain in her neck/shoulders is worth moving head side to side. She denies any previous similar episodes. She denies any acute trauma or injury. No relief from ibuprofen or applying heat or ice to her neck/shoulders. She is allergic to PCN and codeine.   Past Medical History  Diagnosis Date  . Allergy     Past Surgical History  Procedure Laterality Date  . Tubal ligation      Family History  Problem Relation Age of Onset  . Thyroid disease Mother   . Brain cancer Sister     History   Social History  . Marital Status: Divorced    Spouse Name: N/A    Number of Children: N/A  . Years of Education: N/A   Occupational History  . Not on file.   Social History Main Topics  . Smoking status: Never Smoker   . Smokeless tobacco: Not on file  . Alcohol Use: Not on file  . Drug Use: Not on file  . Sexual Activity: Not on file   Other Topics Concern  . Not on file   Social History Narrative  . No narrative on file    Allergies  Allergen Reactions  . Penicillins Rash  . Codeine Anxiety    There are no active problems to display for this patient.   Results for orders placed in visit on 03/02/13  HERPES SIMPLEX VIRUS CULTURE      Result Value Range   Organism ID, Bacteria HERPES SIMPLEX  VIRUS TYPE 1 DETECTED.      No diagnosis found.  No orders of the defined types were placed in this encounter.     Review of Systems  Constitutional: Negative for fever and chills.  HENT: Negative for congestion.   Respiratory: Negative for cough and shortness of breath.   Cardiovascular: Negative for chest pain.  Musculoskeletal: Positive for neck stiffness.       Stiffness in her neck and bilateral shoulders that radiates to her wrist/fingers.  Neurological: Positive for numbness. Negative for syncope and weakness.   Triage Vitals: BP 124/84  Pulse 84  Temp(Src) 98.8 F (37.1 C) (Oral)  Resp 20  Ht 5' 2.25" (1.581 m)  Wt 130 lb 9.6 oz (59.24 kg)  BMI 23.70 kg/m2  SpO2 100%     Objective:   Physical Exam Physical Exam  Nursing note and vitals reviewed. Constitutional: Patient is oriented to person, place, and time. Patient appears well-developed and well-nourished. No distress.  HENT:  Head: Normocephalic and atraumatic.  Neck: Neck supple. No tracheal deviation present. Tender bilaterally at base of posterior neck. Normal reflexes. Nml movement of wrist/fingers. Neck ROM limited to about 30 degrees rotation either way. Pt able to flex neck normally.  Cardiovascular: Normal rate, regular rhythm and normal heart  sounds.   No murmur heard. Pulmonary/Chest: Effort normal and breath sounds normal. No respiratory distress. Patient has no wheezes. Patient has no rales.  Musculoskeletal: Normal range of motion.  Neurological: Patient is alert and oriented to person, place, and time.  Skin: Skin is warm and dry.  Psychiatric: Patient has a normal mood and affect. Patient's behavior is normal.         Assessment & Plan:   Torticollis, acquired - Plan: traMADol (ULTRAM) 50 MG tablet, methylPREDNISolone (MEDROL DOSEPAK) 4 MG tablet, Ambulatory referral to Physical Therapy  Signed, Elvina Sidle, MD

## 2013-05-02 ENCOUNTER — Ambulatory Visit: Payer: BC Managed Care – PPO | Attending: Family Medicine

## 2013-05-02 DIAGNOSIS — IMO0001 Reserved for inherently not codable concepts without codable children: Secondary | ICD-10-CM | POA: Insufficient documentation

## 2013-05-02 DIAGNOSIS — M436 Torticollis: Secondary | ICD-10-CM | POA: Insufficient documentation

## 2013-05-08 ENCOUNTER — Ambulatory Visit: Payer: BC Managed Care – PPO

## 2013-05-09 ENCOUNTER — Ambulatory Visit: Payer: BC Managed Care – PPO

## 2013-05-15 ENCOUNTER — Ambulatory Visit: Payer: BC Managed Care – PPO

## 2013-05-21 ENCOUNTER — Ambulatory Visit: Payer: BC Managed Care – PPO

## 2013-05-22 ENCOUNTER — Ambulatory Visit: Payer: BC Managed Care – PPO

## 2013-07-14 ENCOUNTER — Ambulatory Visit (INDEPENDENT_AMBULATORY_CARE_PROVIDER_SITE_OTHER): Payer: BC Managed Care – PPO | Admitting: Family Medicine

## 2013-07-14 VITALS — BP 120/72 | HR 89 | Temp 99.3°F | Resp 16 | Ht 62.25 in | Wt 130.0 lb

## 2013-07-14 DIAGNOSIS — R059 Cough, unspecified: Secondary | ICD-10-CM

## 2013-07-14 DIAGNOSIS — J209 Acute bronchitis, unspecified: Secondary | ICD-10-CM

## 2013-07-14 DIAGNOSIS — R05 Cough: Secondary | ICD-10-CM

## 2013-07-14 DIAGNOSIS — R51 Headache: Secondary | ICD-10-CM

## 2013-07-14 DIAGNOSIS — R058 Other specified cough: Secondary | ICD-10-CM

## 2013-07-14 MED ORDER — BENZONATATE 100 MG PO CAPS: 100.0000 mg | ORAL_CAPSULE | Freq: Three times a day (TID) | ORAL | Status: DC | PRN

## 2013-07-14 MED ORDER — AZITHROMYCIN 250 MG PO TABS: ORAL_TABLET | ORAL | Status: DC

## 2013-07-14 MED ORDER — HYDROCODONE-HOMATROPINE 5-1.5 MG/5ML PO SYRP: 5.0000 mL | ORAL_SOLUTION | ORAL | Status: DC | PRN

## 2013-07-14 NOTE — Progress Notes (Signed)
Subjective: For couple weeks the patient has been ill. She had some chills when it started. She has a headache. She persists with a cough, with a little yellow phlegm. She has had some head congestion. She does not smoke. She works at a couple of nursing homes. She been coughing for a couple of weekends and they sent her on in here from work to get checked. Objective:  Healthy appearing lady. Her TMs are normal. Throat clear. Neck supple without significant nodes. Chest is clear to auscultation. Heart regular without murmurs.  Assessment: Post viral cough syndrome Headache Bronchitis  Plan: Azithromycin, Hycodan, Tessalon Return if worse or not improving

## 2013-07-14 NOTE — Patient Instructions (Signed)
Take the cough syrup 1 teaspoon every 4-6 hours as needed for cough. It may make you drowsy and you may not want to take it when you're going to work  Take Tessalon 1 or 2 every 6 or 8 hours if needed for coughing. It does not work as well as the cough syrup, but you can take it when you go to work without feeling too drowsy  Take the antibiotic, azithromycin, 2 pills today, then one daily for 4 days for infection  Take ibuprofen over-the-counter 600 mg (200x3 pills) every 6 or 8 hours as needed for headache  Drink plenty of fluid and get enough rest  Return if not improving

## 2014-07-26 ENCOUNTER — Ambulatory Visit (INDEPENDENT_AMBULATORY_CARE_PROVIDER_SITE_OTHER): Payer: BLUE CROSS/BLUE SHIELD | Admitting: Internal Medicine

## 2014-07-26 ENCOUNTER — Ambulatory Visit (INDEPENDENT_AMBULATORY_CARE_PROVIDER_SITE_OTHER): Payer: BLUE CROSS/BLUE SHIELD

## 2014-07-26 VITALS — BP 126/82 | HR 85 | Temp 97.9°F | Resp 18 | Ht 62.0 in | Wt 129.0 lb

## 2014-07-26 DIAGNOSIS — M79642 Pain in left hand: Secondary | ICD-10-CM

## 2014-07-26 IMAGING — CR DG HAND COMPLETE 3+V*L*
3 series · 3 of 3 positions shown · non-contrast
Comparison: Left hand series of [DATE]

CLINICAL DATA: Status post fall on this no last month with
persistent left hand pain

EXAM:
LEFT HAND - COMPLETE 3+ VIEW

[PA]
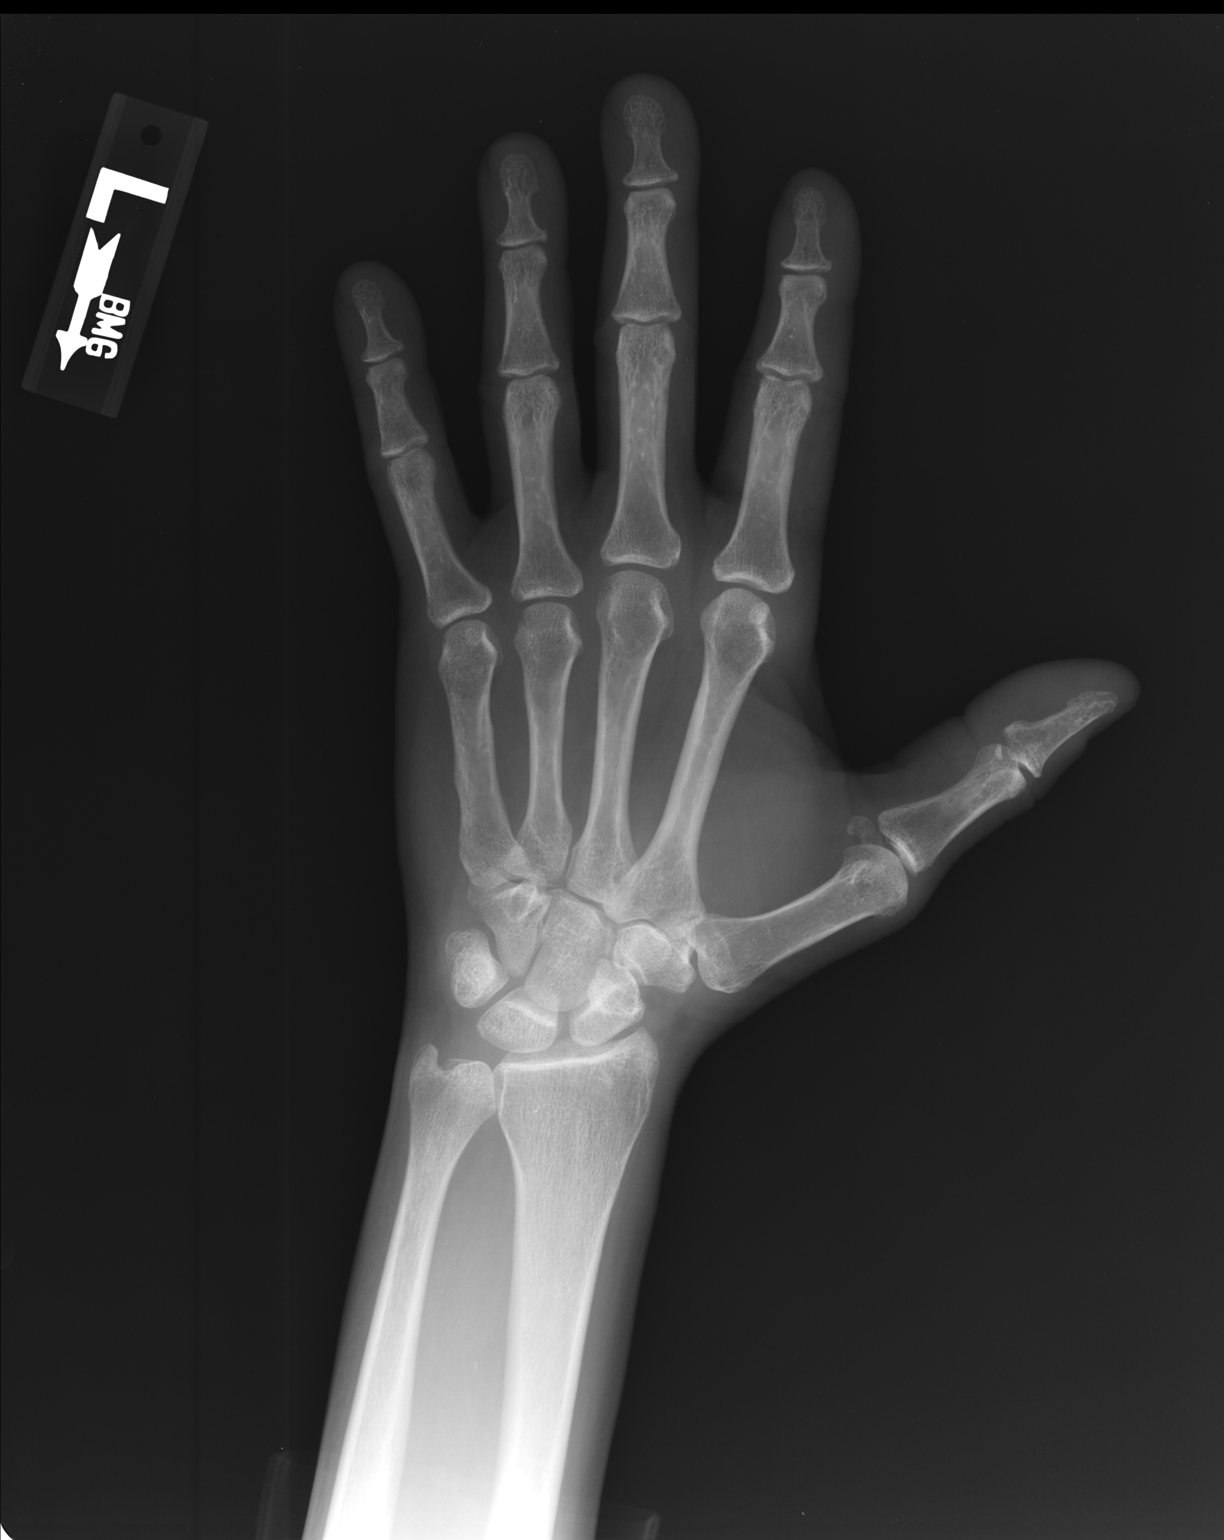

[pa obl]
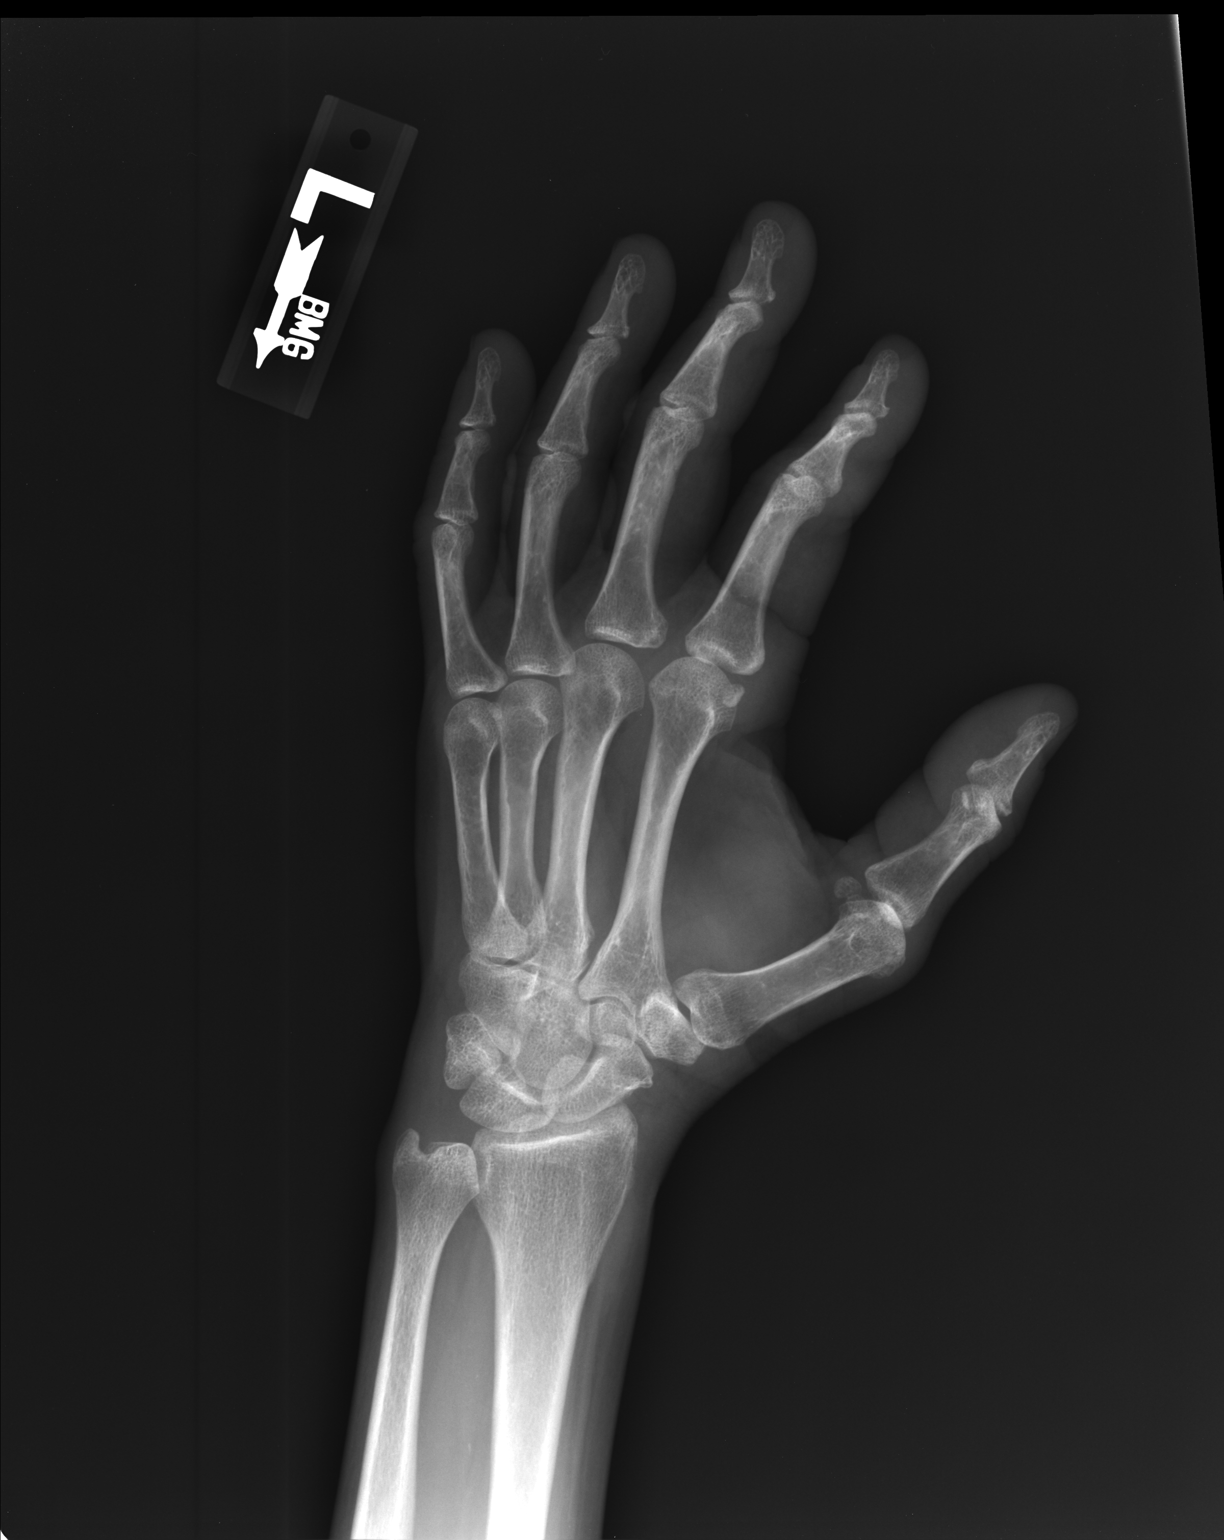

[lateral]
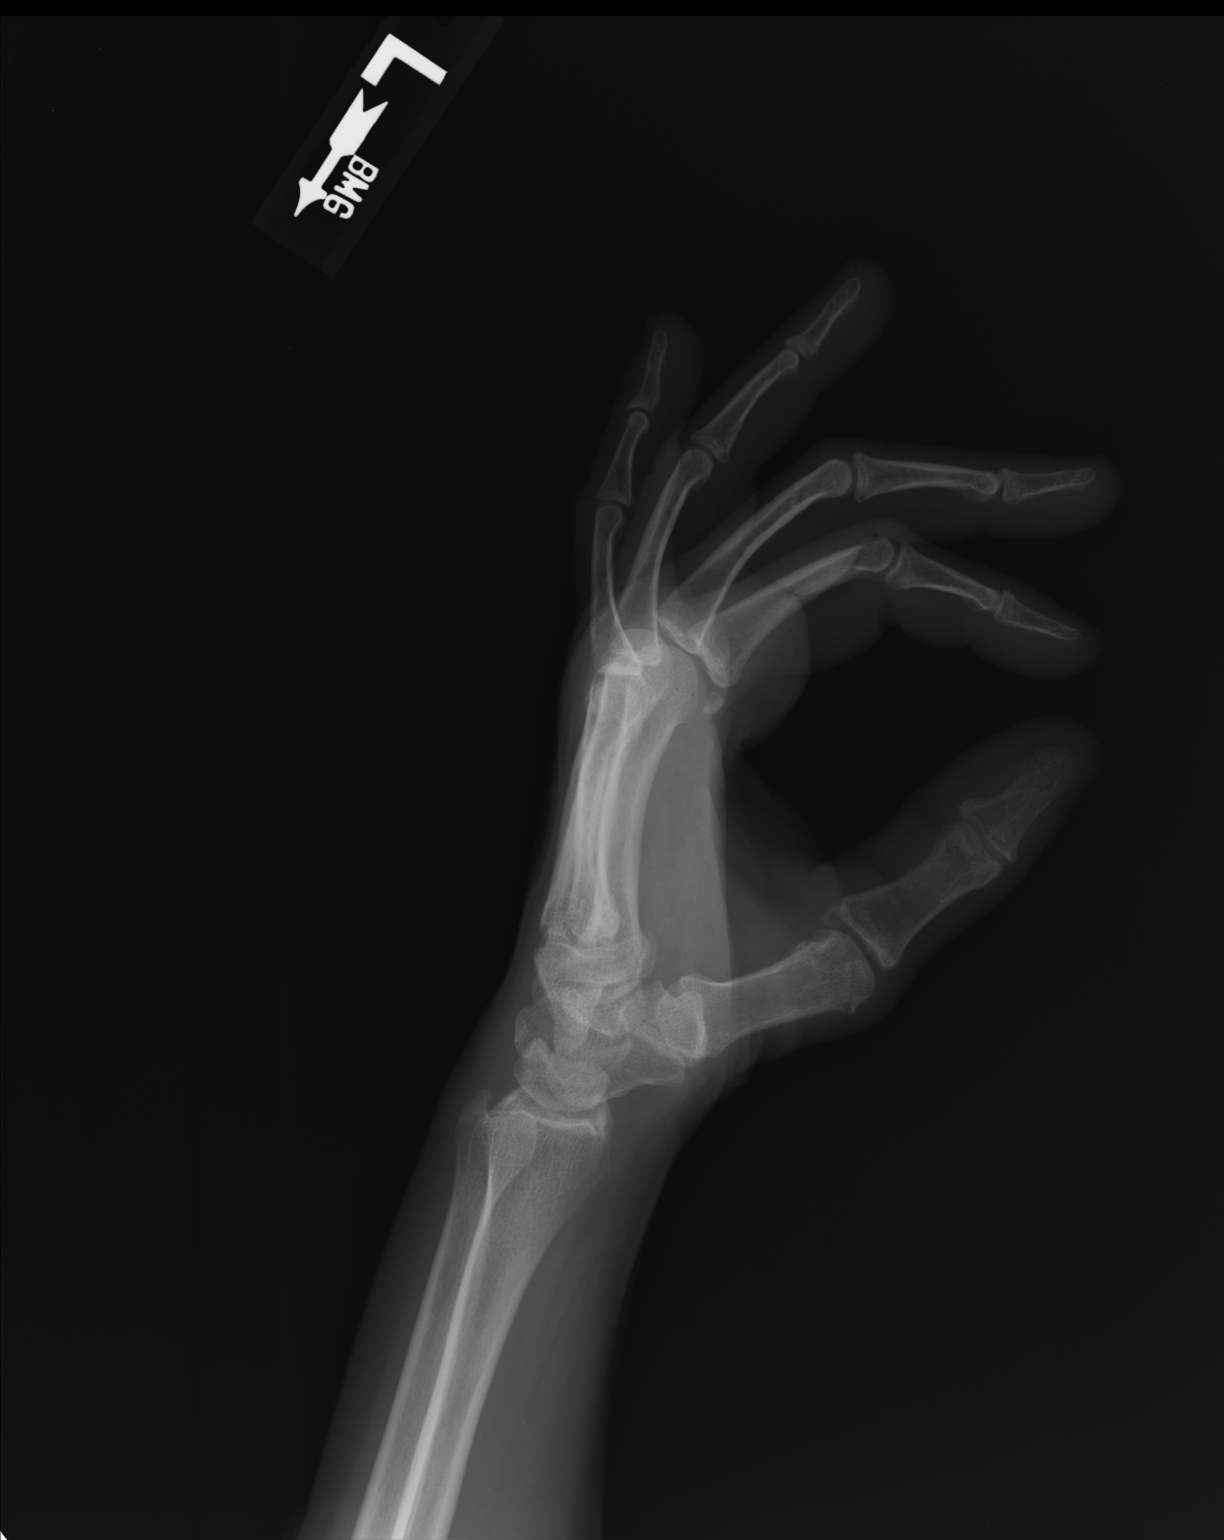

[3 of 3 positions shown; findings below may reference images not displayed]

FINDINGS: The bones of the left hand are adequately mineralized. The
interphalangeal and metacarpophalangeal joint spaces are preserved.
There is no acute fracture nor dislocation. There is mild
degenerative change of the first carpometacarpal joint which has
progressed slightly since the previous study. The intercarpal,
radiocarpal, and ulnocarpal joints are normal. The soft tissues
exhibit no acute abnormalities.
IMPRESSION: There is no acute bony abnormality of the left hand.

## 2014-07-26 MED ORDER — FLUTICASONE PROPIONATE 50 MCG/ACT NA SUSP: 2.0000 | Freq: Every day | NASAL | Status: DC

## 2014-07-26 MED ORDER — AZITHROMYCIN 250 MG PO TABS: ORAL_TABLET | ORAL | Status: DC

## 2014-07-26 MED ORDER — MELOXICAM 15 MG PO TABS: 15.0000 mg | ORAL_TABLET | Freq: Every day | ORAL | Status: DC

## 2014-07-26 NOTE — Progress Notes (Signed)
Subjective:    Patient ID: Diana [name], female    DOB: 07-Apr-1956, 59 y.o.   MRN: [id number] This chart was scribed for Elvina Sidle, MD by Jolene Provost, Medical Scribe. This patient was seen in Room 12 and the patient's care was started a 9:55 AM.  Chief Complaint  Patient presents with  . Sinusitis    x2 weeks   . Ear Pain  . Sore Throat  . Hand Injury    left fell  . rx refill    flonase    HPI HPI Comments: Diana [name] is a 59 y.o. female who presents to [facility] complaining of sinus pressure that started last week. Pt has taken tylenol cold and sinus with minimal relief. Pt endorses associated mild cough, mild ear pain, sore gums, and mild posterior neck pain. Pt denies fever. Pt is also out of her Flonase medication, and states that is contributing to her sx.  Pt also endorses left hand pain. Pt states she fell and caught herself with her left hand. Pt endorses associated mild numbness in the left hand as well as stiffness. Pt states she was up in the middle of the night due to pain.    Review of Systems  Constitutional: Negative for fever and chills.  HENT: Positive for congestion and sinus pressure.        Sore gums  Respiratory: Positive for cough.   Musculoskeletal: Positive for neck pain.  Psychiatric/Behavioral: Positive for sleep disturbance.       Objective:   Physical Exam  Constitutional: She is oriented to person, place, and time. She appears well-developed and well-nourished. No distress.  HENT:  Head: Normocephalic and atraumatic.  Mouth/Throat: Oropharynx is clear and moist.  Conjunctiva clear, TMs clear. Nares with purulent discharge. Tender max areas to perc. Gums with poor dentition and gingivitis.  Eyes: Pupils are equal, round, and reactive to light.  Neck: Neck supple.  Cardiovascular: Normal rate and normal heart sounds.   No murmur heard. Pulmonary/Chest: Effort normal and breath sounds normal. No respiratory distress. She has no  wheezes.  Musculoskeletal: Normal range of motion. She exhibits edema and tenderness.  Palmer aspect of the second and third MCs is swollen and tender and there is palpable crepitous with flexion of the third flexor tendon. Grip weak secondary to pain. Subjective decrease in sensation over the MCPs 2-3.  Neurological: She is alert and oriented to person, place, and time. Coordination normal.  Skin: Skin is warm and dry. She is not diaphoretic.  Psychiatric: She has a normal mood and affect. Her behavior is normal.  Nursing note and vitals reviewed.  [facility] reading (PRIMARY) by  Dr. Josephina Gip fx at [facility] 2/3 or MCP 3//some degen changes.      Assessment & Plan:  Sinusitis--zith/flonase AR Strain flexor L#3---splint//rom hot water bid --if not well 3 wkks Hand ortho consult///melox Dental problems--ref to dentist  Meds ordered this encounter  Medications  . DISCONTD: fluticasone (FLONASE) 50 MCG/ACT nasal spray    Sig: Place 2 sprays into both nostrils daily.  . fluticasone (FLONASE) 50 MCG/ACT nasal spray    Sig: Place 2 sprays into both nostrils daily.    Dispense:  15.8 g    Refill:  6  . azithromycin (ZITHROMAX) 250 MG tablet    Sig: Take 2 pills initially, then one daily for 4 days for infection    Dispense:  6 tablet    Refill:  0  . meloxicam (MOBIC) 15 MG tablet  Sig: Take 1 tablet (15 mg total) by mouth daily.    Dispense:  30 tablet    Refill:  0    I have completed the patient encounter in its entirety as documented by the scribe, with editing by me where necessary. Robert P. Merla Richesoolittle, M.D.

## 2014-12-18 DIAGNOSIS — K219 Gastro-esophageal reflux disease without esophagitis: Secondary | ICD-10-CM | POA: Insufficient documentation

## 2014-12-18 DIAGNOSIS — M5136 Other intervertebral disc degeneration, lumbar region: Secondary | ICD-10-CM | POA: Insufficient documentation

## 2015-02-11 ENCOUNTER — Ambulatory Visit (INDEPENDENT_AMBULATORY_CARE_PROVIDER_SITE_OTHER): Payer: BLUE CROSS/BLUE SHIELD | Admitting: Physician Assistant

## 2015-02-11 VITALS — BP 126/76 | HR 90 | Temp 98.1°F | Resp 16 | Ht 62.0 in | Wt 130.4 lb

## 2015-02-11 DIAGNOSIS — M7741 Metatarsalgia, right foot: Secondary | ICD-10-CM

## 2015-02-11 DIAGNOSIS — M436 Torticollis: Secondary | ICD-10-CM

## 2015-02-11 DIAGNOSIS — G479 Sleep disorder, unspecified: Secondary | ICD-10-CM

## 2015-02-11 DIAGNOSIS — M6248 Contracture of muscle, other site: Secondary | ICD-10-CM | POA: Diagnosis not present

## 2015-02-11 DIAGNOSIS — M542 Cervicalgia: Secondary | ICD-10-CM

## 2015-02-11 DIAGNOSIS — M62838 Other muscle spasm: Secondary | ICD-10-CM

## 2015-02-11 MED ORDER — MELOXICAM 7.5 MG PO TABS: 7.5000 mg | ORAL_TABLET | Freq: Every day | ORAL | Status: DC

## 2015-02-11 MED ORDER — TRAMADOL HCL 50 MG PO TABS: 50.0000 mg | ORAL_TABLET | Freq: Three times a day (TID) | ORAL | Status: DC | PRN

## 2015-02-11 MED ORDER — CYCLOBENZAPRINE HCL 5 MG PO TABS: 5.0000 mg | ORAL_TABLET | Freq: Three times a day (TID) | ORAL | Status: DC | PRN

## 2015-02-11 NOTE — Patient Instructions (Signed)
You have a muscle spasm in your neck - please take the flexeril every 8 hours as needed for the spasm.  Applying heat to the neck along with massage with the tennis ball in a sock should help to resolve the spasm.  The flexeril will help you to sleep. Please come back to see [place] if the trouble sleeping persists for another month or so. For the foot pain please wear the metatarsal cookie for the next few weeks. Taking the mobic once daily along with the ultram every 8 hours as needed will help.

## 2015-02-11 NOTE — Progress Notes (Signed)
Subjective:    Patient ID: Diana [name], female    DOB: [birth year], 59 y.o.   MRN: [id number]  Chief Complaint  Patient presents with  . Foot Pain    right foot, x 2 weeks, hurts to put pressure  . Neck Pain    extends to both shoulders,believes its because of the job she works at  . New Rx    to help her sleep   Medications, allergies, past medical history, surgical history, family history, social history and problem list reviewed and updated.  HPI  59 yof presents with above complaints.  Right foot pain - Past 2 weeks. Constant. Rated 6/10. Plantar surface right foot. No radiation. No trauma. Has been working extra shifts and walking significantly more at work past month since golf cart broke down.   Neck pain - Bilateral neck/shoulders past few weeks. Mild, 3-4/10. No radiation. No UE numbness/weakness/tingling. No trauma. Full neck rom. No fevers, chills.   Trouble sleeping - Past few months. Trouble initiating sleep. Has tried otc melatonin and unisom without relief. Over past few months she has been working more at her 2 jobs along with working harder at one of the jobs. Her mom was also in an accident and she has been taking care of the house past couple months. Denies caffeine, alcohol, nicotine, drug use. Denies hx depression or anxiety. Denies feeling down or anxious feelings.    Review of Systems See HPI. No cp, sob, cough, weakness, numbness, tingling.     Objective:   Physical Exam  Constitutional: She is oriented to person, place, and time. She appears well-developed and well-nourished.  Non-toxic appearance. She does not have a sickly appearance. She does not appear ill. No distress.  BP 126/76 mmHg  Pulse 90  Temp(Src) 98.1 F (36.7 C) (Oral)  Resp 16  Ht 5\' 2"  (1.575 m)  Wt 130 lb 6.4 oz (59.149 kg)  BMI 23.84 kg/m2  SpO2 98%   Neck: Normal range of motion. Muscular tenderness present. No spinous process tenderness present. Normal range of motion present.     Small bilateral cervical muscle spasms.   Cardiovascular: Normal rate, regular rhythm and normal heart sounds.   Pulmonary/Chest: Effort normal and breath sounds normal. No tachypnea.  Musculoskeletal:       Right ankle: Normal.       Right foot: There is tenderness. There is normal range of motion, no swelling, normal capillary refill and no crepitus.  TTP at distal metatarsals 2nd and 3rd. No pain or clicking with squeeze test. Normal cap refill. Full rom.   Neurological: She is alert and oriented to person, place, and time.  Psychiatric: She has a normal mood and affect. Her speech is normal and behavior is normal.      Assessment & Plan:   Metatarsalgia of right foot - Plan: meloxicam (MOBIC) 7.5 MG tablet, traMADol (ULTRAM) 50 MG tablet --doubt mortons neuroma with neg squeeze test, no radiation of pain, nad has recently increased work on feet --treat with mobic qd, ultram prn, metatarsal cookie given, off work 2 days to help resolve  Neck pain - Plan: meloxicam (MOBIC) 7.5 MG tablet, traMADol (ULTRAM) 50 MG tablet Muscle spasms of neck - Plan: cyclobenzaprine (FLEXERIL) 5 MG tablet --likely due to increased work with poor posture --flexeril prn, mobic qd, heat, massage  Trouble in sleeping --discussed holding off on starting prescription sleep medicine at this time --discussed calming ritual --flexeril for spasms will help with sleep --once business  resolves if trouble sleeping instructed to rtc for further management  Donnajean Lopes, PA-C Physician Assistant-Certified [facility]  02/11/2015 9:16 AM

## 2015-04-08 DIAGNOSIS — I10 Essential (primary) hypertension: Secondary | ICD-10-CM | POA: Insufficient documentation

## 2015-05-21 ENCOUNTER — Ambulatory Visit (INDEPENDENT_AMBULATORY_CARE_PROVIDER_SITE_OTHER): Payer: BLUE CROSS/BLUE SHIELD | Admitting: Family Medicine

## 2015-05-21 VITALS — BP 124/74 | HR 55 | Temp 98.2°F | Resp 17 | Ht 62.5 in | Wt 129.0 lb

## 2015-05-21 DIAGNOSIS — M2550 Pain in unspecified joint: Secondary | ICD-10-CM

## 2015-05-21 DIAGNOSIS — Z5181 Encounter for therapeutic drug level monitoring: Secondary | ICD-10-CM

## 2015-05-21 DIAGNOSIS — M25572 Pain in left ankle and joints of left foot: Secondary | ICD-10-CM | POA: Diagnosis not present

## 2015-05-21 DIAGNOSIS — M791 Myalgia: Secondary | ICD-10-CM

## 2015-05-21 DIAGNOSIS — M19011 Primary osteoarthritis, right shoulder: Secondary | ICD-10-CM

## 2015-05-21 DIAGNOSIS — M7918 Myalgia, other site: Secondary | ICD-10-CM

## 2015-05-21 DIAGNOSIS — Z113 Encounter for screening for infections with a predominantly sexual mode of transmission: Secondary | ICD-10-CM | POA: Diagnosis not present

## 2015-05-21 DIAGNOSIS — S96912A Strain of unspecified muscle and tendon at ankle and foot level, left foot, initial encounter: Secondary | ICD-10-CM | POA: Diagnosis not present

## 2015-05-21 DIAGNOSIS — M19012 Primary osteoarthritis, left shoulder: Secondary | ICD-10-CM | POA: Diagnosis not present

## 2015-05-21 LAB — COMPREHENSIVE METABOLIC PANEL
ALBUMIN: 4.2 g/dL (ref 3.6–5.1)
ALK PHOS: 73 U/L (ref 33–130)
ALT: 18 U/L (ref 6–29)
AST: 24 U/L (ref 10–35)
BILIRUBIN TOTAL: 0.4 mg/dL (ref 0.2–1.2)
BUN: 21 mg/dL (ref 7–25)
CALCIUM: 9.4 mg/dL (ref 8.6–10.4)
CO2: 26 mmol/L (ref 20–31)
CREATININE: 0.46 mg/dL — AB (ref 0.50–1.05)
Chloride: 104 mmol/L (ref 98–110)
GLUCOSE: 83 mg/dL (ref 65–99)
Potassium: 4 mmol/L (ref 3.5–5.3)
SODIUM: 140 mmol/L (ref 135–146)
Total Protein: 7.3 g/dL (ref 6.1–8.1)

## 2015-05-21 LAB — C-REACTIVE PROTEIN: CRP: <0.5 mg/dL (ref <0.60)

## 2015-05-21 LAB — SEDIMENTATION RATE: SED RATE: 27 mm/h (ref 0–30)

## 2015-05-21 LAB — HIV ANTIBODY (ROUTINE TESTING W REFLEX): HIV 1&2 Ab, 4th Generation: NONREACTIVE

## 2015-05-21 MED ORDER — IBUPROFEN 800 MG PO TABS: 800.0000 mg | ORAL_TABLET | Freq: Three times a day (TID) | ORAL | Status: DC | PRN

## 2015-05-21 MED ORDER — CYCLOBENZAPRINE HCL 10 MG PO TABS: 10.0000 mg | ORAL_TABLET | Freq: Three times a day (TID) | ORAL | Status: DC | PRN

## 2015-05-21 NOTE — Progress Notes (Signed)
Subjective:  This chart was scribed for Norberto SorensonEva Rudell Marlowe, MD by Stann Oresung-Kai Tsai, Medical Scribe. This patient was seen in Room 10 and the patient's care was started 8:26 AM.   Patient ID: Diana [name], female    DOB: 08/08/1955, 59 y.o.   MRN: [id number] Chief Complaint  Patient presents with  . Shoulder Pain    both   . Foot Pain    left foot     HPI Diana [name] is a 59 y.o. female who [facility] complaining of bilateral shoulder pain and left foot pain.  Last seen here 3 months prior presenting with right foot pain, neck pain into bilateral shoulders. History in exam was benign, suspected for muscle spasms in neck and metatarsalgia due to increased time on feet. She was instructed to put metatarsal cookie in shoes and start on mobic 7.5 mg with flexeril 5 mg and tramadol 50 mg.   Pt states that she was moving furniture for her mother a few weeks ago and a piece of fell on her left foot. Since then she's been feeling numbness and tingling in her left foot. She's been using salon pas patches and cream for some mild relief. However, she works 2 jobs and is on her feet often, so the pain keeps returning. She's taken aleve without relief. She notes that the medications from previous visit helped but isn't strong enough.   Pt states that she had physical therapy in the past for her shoulders with great improvement so she requests referral for this. She also requests a brace for her left ankle today.   Past Medical History  Diagnosis Date  . Allergy    Prior to Admission medications   Medication Sig Start Date End Date Taking? Authorizing Provider  fluticasone (FLONASE) 50 MCG/ACT nasal spray Place 2 sprays into both nostrils daily. 07/26/14  Yes Tonye Pearsonobert P Doolittle, MD   Allergies  Allergen Reactions  . Penicillins Rash  . Codeine Anxiety    Review of Systems  Constitutional: Negative for fever, chills and fatigue.  Musculoskeletal: Positive for myalgias (left foot). Negative for  back pain, joint swelling, arthralgias and gait problem.  Skin: Negative for rash and wound.  Neurological: Negative for dizziness, weakness and numbness.       Objective:   Physical Exam  Constitutional: She is oriented to person, place, and time. She appears well-developed and well-nourished. No distress.  HENT:  Head: Normocephalic and atraumatic.  Eyes: EOM are normal. Pupils are equal, round, and reactive to light.  Neck: Neck supple.  Cardiovascular: Normal rate.   Pulses:      Dorsalis pedis pulses are 2+ on the right side, and 2+ on the left side.       Posterior tibial pulses are 2+ on the right side, and 2+ on the left side.  Pulmonary/Chest: Effort normal. No respiratory distress.  Musculoskeletal: Normal range of motion.  No lower extremities edema, normal skin, no effusion, no tenderness over medial malleolus and metatarsals, no pain over plantar fascia, 5/5 strength, negative squeeze test, normal ROM, no cords palp in calves, no swelling in calves  No point tenderness in lumbar spine, no SI joint tenderness, negative straight leg raise, pt unable to walk while on ball of foot possibly due to weakness and pain.   Neurological: She is alert and oriented to person, place, and time.  Reflex Scores:      Patellar reflexes are 2+ on the right side and 2+ on the  left side.      Achilles reflexes are 2+ on the right side and 2+ on the left side. Skin: Skin is warm and dry.  Psychiatric: She has a normal mood and affect. Her behavior is normal.  Nursing note and vitals reviewed.   BP 124/74 mmHg  Pulse 55  Temp(Src) 98.2 F (36.8 C) (Oral)  Resp 17  Ht 5' 2.5" (1.588 m)  Wt 129 lb (58.514 kg)  BMI 23.20 kg/m2  SpO2 95%     Assessment & Plan:   1. Arthralgia   2. Screening for STD (sexually transmitted disease) - routine screening for hep C and HIV  3. Medication monitoring encounter   4. Arthralgia of ankle or foot, left   5. Left ankle strain, initial encounter     6. Rhomboid myalgia   7. Osteoarthritis of both shoulders, unspecified osteoarthritis type   Pt will sched an appointment here for a CPE to est PCP here. She will come fasting to that appt to check flp and glucose. Advised pt that I suspect she will cont to have a variety of joint pains due to working 2 physical jobs and working at night -> poor sleep lowers pain threshold. 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 No other otc nsaids while on ibuprofen. Reviewed likely chronic wax/wane nature of her bilateral shoulder, left hand, bilateral feet pain. Refer to podiatry - [facility] but insurance wouldn't cover so will see if podiatry can offer some suggestions/relief.   Orders Placed This Encounter  Procedures  . C-reactive protein  . Sedimentation rate  . Hepatitis C Antibody  . HIV antibody  . Comprehensive metabolic panel  . Ambulatory referral to Podiatry    Referral Priority:  Routine    Referral Type:  Consultation    Referral Reason:  Specialty Services Required    Requested Specialty:  Podiatry    Number of Visits Requested:  1  . Ambulatory referral to Physical Therapy    Referral Priority:  Routine    Referral Type:  Physical Medicine    Referral Reason:  Specialty Services Required    Requested Specialty:  Physical Therapy    Number of Visits Requested:  1    Meds ordered this encounter  Medications  . cyclobenzaprine (FLEXERIL) 10 MG tablet    Sig: Take 1 tablet (10 mg total) by mouth 3 (three) times daily as needed for muscle spasms.    Dispense:  30 tablet    Refill:  2  . ibuprofen (ADVIL,MOTRIN) 800 MG tablet    Sig: Take 1 tablet (800 mg total) by mouth every 8 (eight) hours as needed.    Dispense:  30 tablet    Refill:  0    I personally performed the services  described in this documentation, which was scribed in my presence. The recorded information has been reviewed and considered, and addended by me as needed.  Norberto Sorenson, MD MPH   By signing my name below, I, [name], attest that this documentation has been prepared under the direction and in the presence of Norberto Sorenson, MD. Electronically Signed: [name], Scribe. 05/21/2015 , 8:26 AM .

## 2015-05-21 NOTE — Patient Instructions (Addendum)
RICE:  R: Rest is achieved by limiting weightbearing. Whenever possible, sit down. I:   Ice for 15 to 20 minutes every two to three times a day. C: Compression with an elastic bandage to minimize swelling should be applied early. E: Elevation - The injured foot should be kept elevated above the level of the heart to further alleviate swelling.  Nonsteroidal antiinflammatory drugs (NSAIDs) can be used; no particular NSAID has been shown to be superior so you can using whatever you have at home - we will try you on high dose ibuprofen.  Exercises including pointing and flexing the foot and doing foot circles should be started early, once acute pain and swelling subside, to maintain range of motion. The intensity of rehabilitation is increased gradually.  Ankle splints or braces can limit extremes of joint motion and allow early weightbearing while protecting against reinjury.   Osteoarthritis Osteoarthritis is a disease that causes soreness and inflammation of a joint. It occurs when the cartilage at the affected joint wears down. Cartilage acts as a cushion, covering the ends of bones where they meet to form a joint. Osteoarthritis is the most common form of arthritis. It often occurs in older people. The joints affected most often by this condition include those in the:  Ends of the fingers.  Thumbs.  Neck.  Lower back.  Knees.  Hips. CAUSES  Over time, the cartilage that covers the ends of bones begins to wear away. This causes bone to rub on bone, producing pain and stiffness in the affected joints.  RISK FACTORS Certain factors can increase your chances of having osteoarthritis, including:  Older age.  Excessive body weight.  Overuse of joints.  Previous joint injury. SIGNS AND SYMPTOMS   Pain, swelling, and stiffness in the joint.  Over time, the joint may lose its normal shape.  Small deposits of bone (osteophytes) may grow on the edges of the joint.  Bits of bone or  cartilage can break off and float inside the joint space. This may cause more pain and damage. DIAGNOSIS  Your health care provider will do a physical exam and ask about your symptoms. Various tests may be ordered, such as:  X-rays of the affected joint.  Blood tests to rule out other types of arthritis. Additional tests may be used to diagnose your condition. TREATMENT  Goals of treatment are to control pain and improve joint function. Treatment plans may include:  A prescribed exercise program that allows for rest and joint relief.  A weight control plan.  Pain relief techniques, such as:  Properly applied heat and cold.  Electric pulses delivered to nerve endings under the skin (transcutaneous electrical nerve stimulation [TENS]).  Massage.  Certain nutritional supplements.  Medicines to control pain, such as:  Acetaminophen.  Nonsteroidal anti-inflammatory drugs (NSAIDs), such as naproxen.  Narcotic or central-acting agents, such as tramadol.  Corticosteroids. These can be given orally or as an injection.  Surgery to reposition the bones and relieve pain (osteotomy) or to remove loose pieces of bone and cartilage. Joint replacement may be needed in advanced states of osteoarthritis. HOME CARE INSTRUCTIONS   Take medicines only as directed by your health care provider.  Maintain a healthy weight. Follow your health care provider's instructions for weight control. This may include dietary instructions.  Exercise as directed. Your health care provider can recommend specific types of exercise. These may include:  Strengthening exercises. These are done to strengthen the muscles that support joints affected by arthritis.  They can be performed with weights or with exercise bands to add resistance.  Aerobic activities. These are exercises, such as brisk walking or low-impact aerobics, that get your heart pumping.  Range-of-motion activities. These keep your joints  limber.  Balance and agility exercises. These help you maintain daily living skills.  Rest your affected joints as directed by your health care provider.  Keep all follow-up visits as directed by your health care provider. SEEK MEDICAL CARE IF:   Your skin turns red.  You develop a rash in addition to your joint pain.  You have worsening joint pain.  You have a fever along with joint or muscle aches. SEEK IMMEDIATE MEDICAL CARE IF:  You have a significant loss of weight or appetite.  You have night sweats. FOR MORE INFORMATION   National Institute of Arthritis and Musculoskeletal and Skin Diseases: www.niams.http://www.myers.net/  [employer] on Aging: https://walker.com/  American College of Rheumatology: www.rheumatology.org   This information is not intended to replace advice given to you by your health care provider. Make sure you discuss any questions you have with your health care provider.   Document Released: 05/17/2005 Document Revised: 06/07/2014 Document Reviewed: 01/22/2013 Elsevier Interactive Patient Education 2016 Elsevier Inc. Foot Sprain A foot sprain is an injury to one of the strong bands of tissue (ligaments) that connect and support the many bones in your feet. The ligament can be stretched too much or it can tear. A tear can be either partial or complete. The severity of the sprain depends on how much of the ligament was damaged or torn. CAUSES A foot sprain is usually caused by suddenly twisting or pivoting your foot. RISK FACTORS This injury is more likely to occur in people who:  Play a sport, such as basketball or football.  Exercise or play a sport without warming up.  Start a new workout or sport.  Suddenly increase how long or hard they exercise or play a sport. SYMPTOMS Symptoms of this condition start soon after an injury and include:  Pain, especially in the arch of the foot.  Bruising.  Swelling.  Inability to walk or use the foot to  support body weight. DIAGNOSIS This condition is diagnosed with a medical history and physical exam. You may also have imaging tests, such as:  X-rays to make sure there are no broken bones (fractures).  MRI to see if the ligament has torn. TREATMENT Treatment varies depending on the severity of your sprain. Mild sprains can be treated with rest, ice, compression, and elevation (RICE). If your ligament is overstretched or partially torn, treatment usually involves keeping your foot in a fixed position (immobilization) for a period of time. To help you do this, your health care provider will apply a bandage, splint, or walking boot to keep your foot from moving until it heals. You may also be advised to use crutches or a scooter for a few weeks to avoid bearing weight on your foot while it is healing. If your ligament is fully torn, you may need surgery to reconnect the ligament to the bone. After surgery, a cast or splint will be applied and will need to stay on your foot while it heals. Your health care provider may also suggest exercises or physical therapy to strengthen your foot. HOME CARE INSTRUCTIONS If You Have a Bandage, Splint, or Walking Boot:  Wear it as directed by your health care provider. Remove it only as directed by your health care provider.  Loosen the bandage,  splint, or walking boot if your toes become numb and tingle, or if they turn cold and blue. Bathing  If your health care provider approves bathing and showering, cover the bandage or splint with a watertight plastic bag to protect it from water. Do not let the bandage or splint get wet. Managing Pain, Stiffness, and Swelling   If directed, apply ice to the injured area:  Put ice in a plastic bag.  Place a towel between your skin and the bag.  Leave the ice on for 20 minutes, 2-3 times per day.  Move your toes often to avoid stiffness and to lessen swelling.  Raise (elevate) the injured area above the level of  your heart while you are sitting or lying down. Driving  Do not drive or operate heavy machinery while taking pain medicine.  Do not drive while wearing a bandage, splint, or walking boot on a foot that you use for driving. Activity  Rest as directed by your health care provider.  Do not use the injured foot to support your body weight until your health care provider says that you can. Use crutches or other supportive devices as directed by your health care provider.  Ask your health care provider what activities are safe for you. Gradually increase how much and how far you walk until your health care provider says it is safe to return to full activity.  Do any exercise or physical therapy as directed by your health care provider. General Instructions  If a splint was applied, do not put pressure on any part of it until it is fully hardened. This may take several hours.  Take medicines only as directed by your health care provider. These include over-the-counter medicines and prescription medicines.  Keep all follow-up visits as directed by your health care provider. This is important.  When you can walk without pain, wear supportive shoes that have stiff soles. Do not wear flip-flops, and do not walk barefoot. SEEK MEDICAL CARE IF:  Your pain is not controlled with medicine.  Your bruising or swelling gets worse or does not get better with treatment.  Your splint or walking boot is damaged. SEEK IMMEDIATE MEDICAL CARE IF:  Your foot is numb or blue.  Your foot feels colder than normal.   This information is not intended to replace advice given to you by your health care provider. Make sure you discuss any questions you have with your health care provider.   Document Released: 11/06/2001 Document Revised: 10/01/2014 Document Reviewed: 03/20/2014 Elsevier Interactive Patient Education [employer]

## 2015-05-22 LAB — HEPATITIS C ANTIBODY: HCV Ab: NEGATIVE

## 2015-05-27 ENCOUNTER — Encounter: Payer: Self-pay | Admitting: Family Medicine

## 2015-05-29 ENCOUNTER — Telehealth: Payer: Self-pay

## 2015-05-29 NOTE — Telephone Encounter (Signed)
Medication for pain is not working ibuprofen (ADVIL,MOTRIN) 800 MG tablet  The pills are too big.  [facility] - [address]   [phone]

## 2015-05-29 NOTE — Telephone Encounter (Signed)
Can we Rx something else?

## 2015-05-30 ENCOUNTER — Telehealth: Payer: Self-pay

## 2015-05-30 MED ORDER — NAPROXEN 500 MG PO TABS: 500.0000 mg | ORAL_TABLET | Freq: Two times a day (BID) | ORAL | Status: DC

## 2015-05-30 NOTE — Telephone Encounter (Signed)
PT called back and would like the naproxyn to be called in.  Please advise when ready  [phone]

## 2015-05-30 NOTE — Telephone Encounter (Signed)
She can switch to otc ibuprofen - take 4 tabs of the over the counter at once every 8 hours or will send in naproxyn.

## 2015-06-13 ENCOUNTER — Encounter: Payer: Self-pay | Admitting: Podiatry

## 2015-06-13 ENCOUNTER — Ambulatory Visit (INDEPENDENT_AMBULATORY_CARE_PROVIDER_SITE_OTHER): Payer: BLUE CROSS/BLUE SHIELD | Admitting: Podiatry

## 2015-06-13 ENCOUNTER — Ambulatory Visit (INDEPENDENT_AMBULATORY_CARE_PROVIDER_SITE_OTHER): Payer: BLUE CROSS/BLUE SHIELD

## 2015-06-13 VITALS — BP 134/89 | HR 107 | Resp 12

## 2015-06-13 DIAGNOSIS — M79672 Pain in left foot: Secondary | ICD-10-CM

## 2015-06-13 DIAGNOSIS — M79671 Pain in right foot: Secondary | ICD-10-CM | POA: Diagnosis not present

## 2015-06-13 DIAGNOSIS — M779 Enthesopathy, unspecified: Secondary | ICD-10-CM | POA: Diagnosis not present

## 2015-06-13 MED ORDER — TRIAMCINOLONE ACETONIDE 10 MG/ML IJ SUSP
10.0000 mg | Freq: Once | INTRAMUSCULAR | Status: AC
  Administered 2015-06-13: 10 mg

## 2015-06-13 MED ORDER — PREDNISONE 10 MG PO TABS: ORAL_TABLET | ORAL | Status: DC

## 2015-06-13 NOTE — Progress Notes (Signed)
   Subjective:    Patient ID: Diana [name], female    DOB: 12/31/1955, 60 y.o.   MRN: [id number]  HPI  ''LT FOOT/TOES, RT BALL OF THE FOOT ARE PAINFUL FOR 1 MONTH. FEET ARE GETTING WORSE ESPECIALLY WHEN WALKING/STANDING. TRIED OTC PATCH/CREAM FOR PAIN-NO RELIEF.  Review of Systems  Musculoskeletal: Positive for gait problem.       Objective:   Physical Exam        Assessment & Plan:

## 2015-06-15 NOTE — Progress Notes (Signed)
Subjective:     Patient ID: Diana [name], female   DOB: 07/03/1955, 60 y.o.   MRN: [id number]  HPI patient presents with chronic pain plantar aspect right foot and also stating she needs support as she has had in the past for her foot structure   Review of Systems  All other systems reviewed and are negative.      Objective:   Physical Exam  Constitutional: She is oriented to person, place, and time.  Cardiovascular: Intact distal pulses.   Musculoskeletal: Normal range of motion.  Neurological: She is oriented to person, place, and time.  Skin: Skin is warm.  Nursing note and vitals reviewed.  neurovascular status found to be intact with muscle strength adequate range of motion within normal limits with patient noted to have exquisite discomfort second metatarsophalangeal joint right with fluid buildup and pain. Patient also has moderate depression of the arch noted     Assessment:     Inflammatory capsulitis second MPJ right with pain to palpation    Plan:     H&P conditions reviewed and discussed injection with risk. Patient wants procedure and I did proximal nerve block aspirated second MPJ getting out of small amount of clear fluid and injected with quarter cc dexamethasone Kenalog and applied thick plantar padding. I then recommended long-term orthotics to diffuse weight off this joint surface and patient is scanned today for customized orthotic devices

## 2015-06-23 ENCOUNTER — Telehealth: Payer: Self-pay | Admitting: *Deleted

## 2015-06-23 NOTE — Telephone Encounter (Addendum)
Pt states she is having an issue with her left foot.  I left message to call again or to make an appt.  06/24/2015 - left message requesting a callback with questions or to go ahead and schedule follow up appt.

## 2015-07-09 ENCOUNTER — Ambulatory Visit: Payer: BLUE CROSS/BLUE SHIELD | Admitting: *Deleted

## 2015-07-09 DIAGNOSIS — M779 Enthesopathy, unspecified: Secondary | ICD-10-CM

## 2015-07-09 NOTE — Patient Instructions (Signed)

## 2015-07-09 NOTE — Progress Notes (Signed)
Patient ID: Diana [name], female   DOB: [birth year], 60 y.o.   MRN: [id number] Patient presents for orthotic pick up.  Verbal and written break in and wear instructions given.  Patient will follow up in 4 weeks if symptoms worsen or fail to improve.

## 2015-07-22 ENCOUNTER — Ambulatory Visit (INDEPENDENT_AMBULATORY_CARE_PROVIDER_SITE_OTHER): Payer: BLUE CROSS/BLUE SHIELD | Admitting: Family Medicine

## 2015-07-22 VITALS — BP 147/88 | HR 88 | Temp 98.0°F | Resp 16 | Ht 62.5 in | Wt 132.0 lb

## 2015-07-22 DIAGNOSIS — M2550 Pain in unspecified joint: Secondary | ICD-10-CM | POA: Diagnosis not present

## 2015-07-22 DIAGNOSIS — R030 Elevated blood-pressure reading, without diagnosis of hypertension: Secondary | ICD-10-CM

## 2015-07-22 DIAGNOSIS — IMO0001 Reserved for inherently not codable concepts without codable children: Secondary | ICD-10-CM

## 2015-07-22 DIAGNOSIS — J309 Allergic rhinitis, unspecified: Secondary | ICD-10-CM | POA: Diagnosis not present

## 2015-07-22 MED ORDER — CYCLOBENZAPRINE HCL 10 MG PO TABS: 10.0000 mg | ORAL_TABLET | Freq: Three times a day (TID) | ORAL | Status: DC | PRN

## 2015-07-22 MED ORDER — CETIRIZINE HCL 10 MG PO TABS
10.0000 mg | ORAL_TABLET | Freq: Every day | ORAL | Status: DC
Start: 2015-07-22 — End: 2016-01-29

## 2015-07-22 MED ORDER — FLUTICASONE PROPIONATE 50 MCG/ACT NA SUSP: 2.0000 | Freq: Every day | NASAL | Status: DC

## 2015-07-22 MED ORDER — TRAMADOL HCL 50 MG PO TABS: 50.0000 mg | ORAL_TABLET | Freq: Three times a day (TID) | ORAL | Status: DC | PRN

## 2015-07-22 MED ORDER — INDOMETHACIN 50 MG PO CAPS: 50.0000 mg | ORAL_CAPSULE | Freq: Two times a day (BID) | ORAL | Status: DC

## 2015-07-22 NOTE — Patient Instructions (Signed)
Do not use with any other otc pain medication other than tylenol/acetaminophen while you are on the indomethacin - so no aleve, ibuprofen, motrin, advil, etc.  Arthritis Arthritis is a term that is commonly used to refer to joint pain or joint disease. There are more than 100 types of arthritis. CAUSES The most common cause of this condition is wear and tear of a joint. Other causes include:  Gout.  Inflammation of a joint.  An infection of a joint.  Sprains and other injuries near the joint.  A drug reaction or allergic reaction. In some cases, the cause may not be known. SYMPTOMS The main symptom of this condition is pain in the joint with movement. Other symptoms include:  Redness, swelling, or stiffness at a joint.  Warmth coming from the joint.  Fever.  Overall feeling of illness. DIAGNOSIS This condition may be diagnosed with a physical exam and tests, including:  Blood tests.  Urine tests.  Imaging tests, such as MRI, X-rays, or a CT scan. Sometimes, fluid is removed from a joint for testing. TREATMENT Treatment for this condition may involve:  Treatment of the cause, if it is known.  Rest.  Raising (elevating) the joint.  Applying cold or hot packs to the joint.  Medicines to improve symptoms and reduce inflammation.  Injections of a steroid such as cortisone into the joint to help reduce pain and inflammation. Depending on the cause of your arthritis, you may need to make lifestyle changes to reduce stress on your joint. These changes may include exercising more and losing weight. HOME CARE INSTRUCTIONS Medicines  Take over-the-counter and prescription medicines only as told by your health care provider.  Do not take aspirin to relieve pain if gout is suspected. Activities  Rest your joint if told by your health care provider. Rest is important when your disease is active and your joint feels painful, swollen, or stiff.  Avoid activities that make  the pain worse. It is important to balance activity with rest.  Exercise your joint regularly with range-of-motion exercises as told by your health care provider. Try doing low-impact exercise, such as:  Swimming.  Water aerobics.  Biking.  Walking. Joint Care  If your joint is swollen, keep it elevated if told by your health care provider.  If your joint feels stiff in the morning, try taking a warm shower.  If directed, apply heat to the joint. If you have diabetes, do not apply heat without permission from your health care provider.  Put a towel between the joint and the hot pack or heating pad.  Leave the heat on the area for 20-30 minutes.  If directed, apply ice to the joint:  Put ice in a plastic bag.  Place a towel between your skin and the bag.  Leave the ice on for 20 minutes, 2-3 times per day.  Keep all follow-up visits as told by your health care provider. This is important. SEEK MEDICAL CARE IF:  The pain gets worse.  You have a fever. SEEK IMMEDIATE MEDICAL CARE IF:  You develop severe joint pain, swelling, or redness.  Many joints become painful and swollen.  You develop severe back pain.  You develop severe weakness in your leg.  You cannot control your bladder or bowels.   This information is not intended to replace advice given to you by your health care provider. Make sure you discuss any questions you have with your health care provider.   Document Released: 06/24/2004 Document  Revised: 02/05/2015 Document Reviewed: 08/12/2014 Elsevier Interactive Patient Education [employer]

## 2015-07-22 NOTE — Progress Notes (Signed)
Subjective:    Patient ID: Diana [name], female    DOB: [birth year], 60 y.o.   MRN: [id number] Chief Complaint  Patient presents with  . Follow-up  . Shoulder Pain  . Foot Pain  . Allergies    HPI  Diana [name] is a delightful 60 yo woman who I last saw 2 mos ago.  She has had mult arthralgias prior exacerbated by working 2 jobs on her feet. Was initially put on mobic 7.4 with prn flexeril and prn tramadol with minimal improvement so tried on ibuprofen 800 at last visit and increased flexeril. Has also failed sal aleve,on pas patches and top otc cream.  She had had sig improvement with PT prior so was referred back at last visit.  Was sleeping poorly.  Saw podiatry 1 mo prior. They gave her a shot in her foot and gave her insoles - they told her that it could take a month or so to get used to and has only had them for 2 weeks so is trying to build up her tolerance wearing them 3-4 hours at a time. However, her pain will get so severe that it radiates up the back of her calves and when it gets so severe then aleve does not help.  THe ibuprofen did not help her shoulders or her calf but the flexeril helped a little.  Has not heard from PT about shoulders  Has had allergy flairs - has had to go to [facility] several times for this.  No GERD. No CP.  Eating constantly.  Past Medical History  Diagnosis Date  . Allergy    Past Surgical History  Procedure Laterality Date  . Tubal ligation    . Wrist surgery Right    No current outpatient prescriptions on file prior to visit.   No current facility-administered medications on file prior to visit.   Allergies  Allergen Reactions  . Penicillins Rash  . Codeine Anxiety   Family History  Problem Relation Age of Onset  . Thyroid disease Mother   . Brain cancer Sister    Social History   Social History  . Marital Status: Divorced    Spouse Name: N/A  . Number of Children: N/A  . Years of Education: N/A   Social History Main Topics  .  Smoking status: Never Smoker   . Smokeless tobacco: Not on file  . Alcohol Use: No  . Drug Use: No  . Sexual Activity: Not on file   Other Topics Concern  . Not on file   Social History Narrative     Review of Systems  Constitutional: Negative for fever, chills, activity change and appetite change.  HENT: Positive for congestion, postnasal drip, rhinorrhea, sinus pressure and sneezing.   Respiratory: Positive for cough. Negative for chest tightness.   Cardiovascular: Negative for chest pain.  Gastrointestinal: Negative for abdominal pain.  Musculoskeletal: Positive for myalgias, back pain, joint swelling, arthralgias and gait problem.  Neurological: Negative for weakness and numbness.  Psychiatric/Behavioral: Positive for sleep disturbance.       Objective:  BP 147/88 mmHg  Pulse 88  Temp(Src) 98 F (36.7 C)  Resp 16  Ht 5' 2.5" (1.588 m)  Wt 132 lb (59.875 kg)  BMI 23.74 kg/m2  Physical Exam  Constitutional: She is oriented to person, place, and time. She appears well-developed and well-nourished. No distress.  HENT:  Head: Normocephalic and atraumatic.  Right Ear: External ear normal.  Left Ear: External ear normal.  Eyes:  Conjunctivae are normal. No scleral icterus.  Neck: Normal range of motion. Neck supple. No thyromegaly present.  Cardiovascular: Normal rate, regular rhythm, normal heart sounds and intact distal pulses.   Pulmonary/Chest: Effort normal and breath sounds normal. No respiratory distress.  Musculoskeletal: She exhibits no edema.  Lymphadenopathy:    She has no cervical adenopathy.  Neurological: She is alert and oriented to person, place, and time.  Skin: Skin is warm and dry. She is not diaphoretic. No erythema.  Psychiatric: She has a normal mood and affect. Her behavior is normal.          Assessment & Plan:  Pt is new to establish here and was recommended to sched for a CPE with fasting labs. cmp nml 2 mos prior. Pt has a needle phobia  so will try to consolidate lab draws  1. Allergic rhinitis, unspecified allergic rhinitis type - anti-histamine and flonase  2. Arthralgia   3. Elevated blood pressure    Refuses flu vaccine   Meds ordered this encounter  Medications  . indomethacin (INDOCIN) 50 MG capsule    Sig: Take 1 capsule (50 mg total) by mouth 2 (two) times daily with a meal.    Dispense:  60 capsule    Refill:  1  . traMADol (ULTRAM) 50 MG tablet    Sig: Take 1 tablet (50 mg total) by mouth every 8 (eight) hours as needed.    Dispense:  30 tablet    Refill:  1  . cyclobenzaprine (FLEXERIL) 10 MG tablet    Sig: Take 1 tablet (10 mg total) by mouth 3 (three) times daily as needed for muscle spasms.    Dispense:  30 tablet    Refill:  2  . fluticasone (FLONASE) 50 MCG/ACT nasal spray    Sig: Place 2 sprays into both nostrils at bedtime.    Dispense:  15.8 g    Refill:  6  . cetirizine (ZYRTEC) 10 MG tablet    Sig: Take 1 tablet (10 mg total) by mouth at bedtime.    Dispense:  90 tablet    Refill:  3     Norberto Sorenson, MD MPH

## 2015-07-24 ENCOUNTER — Ambulatory Visit: Payer: BLUE CROSS/BLUE SHIELD | Admitting: Family Medicine

## 2015-08-21 ENCOUNTER — Ambulatory Visit: Payer: BLUE CROSS/BLUE SHIELD | Attending: Family Medicine

## 2015-08-21 DIAGNOSIS — M542 Cervicalgia: Secondary | ICD-10-CM | POA: Insufficient documentation

## 2015-08-21 DIAGNOSIS — M25512 Pain in left shoulder: Secondary | ICD-10-CM | POA: Diagnosis present

## 2015-08-21 DIAGNOSIS — M25511 Pain in right shoulder: Secondary | ICD-10-CM | POA: Diagnosis present

## 2015-08-21 DIAGNOSIS — R293 Abnormal posture: Secondary | ICD-10-CM | POA: Diagnosis present

## 2015-08-21 DIAGNOSIS — M25579 Pain in unspecified ankle and joints of unspecified foot: Secondary | ICD-10-CM | POA: Diagnosis not present

## 2015-08-21 NOTE — Therapy (Addendum)
[facility] [address] [place], [place], [address] Phone: [phone]   Fax:  [phone]  Physical Therapy Evaluation/Discharge  Patient Details  Name: Diana [name] MRN: [id number] Date of Birth: 07-Mar-1956 Referring Provider: Norberto Sorenson, MD  Encounter Date: 08/21/2015      PT End of Session - 08/21/15 1556    Visit Number 1   Number of Visits 12   Date for PT Re-Evaluation 10/03/15   PT Start Time 0226  late for appointment1 min   PT Stop Time 0300   PT Time Calculation (min) 34 min   Activity Tolerance Patient tolerated treatment well   Behavior During Therapy [facility] for tasks assessed/performed      Past Medical History  Diagnosis Date  . Allergy     Past Surgical History  Procedure Laterality Date  . Tubal ligation    . Wrist surgery Right     There were no vitals filed for this visit.  Visit Diagnosis:  Pain in joint, ankle and foot, unspecified laterality - Plan: PT plan of care cert/re-cert  Pain of both shoulder joints - Plan: PT plan of care cert/re-cert  Abnormal posture - Plan: PT plan of care cert/re-cert  Neck pain - Plan: PT plan of care cert/re-cert      Subjective Assessment - 08/21/15 1428    Subjective  She reports onset of pain without injruy  a couple of months ag. She has seen podiatrist and now has insoles. She reports work a [employer] and relates some of pain to this.      Limitations --  She report working 30 hours due to pain.    Diagnostic tests xrays: negative except inflammation   Patient Stated Goals Hope to get some releif   Currently in Pain? Yes   Pain Score 6    Pain Location Shoulder   Pain Orientation Right;Left;Posterior  really in neck   Pain Descriptors / Indicators Sore   Pain Type Acute pain   Pain Onset More than a month ago   Pain Frequency Constant   Aggravating Factors  Working with buffer at work    Pain Relieving Factors rest, medication,  ice /heat    Multiple Pain Sites Yes   Pain Type Acute pain  4/10 today,  top LT foot at toes.  something lying on foot.      walking and standing  for work             [facility] PT Assessment - 08/21/15 1438    Assessment   Medical Diagnosis bilateral shoulder pain and foot pain   Referring Provider Norberto Sorenson, MD   Onset Date/Surgical Date --  a couple of months ago   Next MD Visit 10/2015   Prior Therapy No   Precautions   Precautions None   Restrictions   Weight Bearing Restrictions No   Balance Screen   Has the patient fallen in the past 6 months No   Has the patient had a decrease in activity level because of a fear of falling?  No   Is the patient reluctant to leave their home because of a fear of falling?  No   Prior Function   Level of Independence Independent   Cognition   Overall Cognitive Status Within Functional Limits for tasks assessed   Coordination   Gross Motor Movements are Fluid and Coordinated Yes   Posture/Postural Control   Posture Comments Rounded shoulders , forward head, mild sway back  and flat upper thoracic spine.    ROM / Strength   AROM / PROM / Strength Strength;AROM   AROM   AROM Assessment Site Cervical;Shoulder;Ankle   Right/Left Shoulder Right;Left   Right Shoulder Extension 45 Degrees   Right Shoulder Flexion 150 Degrees   Right Shoulder ABduction 160 Degrees   Right Shoulder Internal Rotation 50 Degrees   Right Shoulder External Rotation 93 Degrees   Right Shoulder Horizontal ABduction 20 Degrees   Right Shoulder Horizontal  ADduction 97 Degrees   Left Shoulder Extension 45 Degrees   Left Shoulder Flexion 153 Degrees   Left Shoulder ABduction 158 Degrees   Left Shoulder Internal Rotation 62 Degrees   Left Shoulder External Rotation 93 Degrees   Left Shoulder Horizontal ABduction 22 Degrees   Left Shoulder Horizontal ADduction 98 Degrees   Right/Left Ankle Right;Left   Right Ankle Dorsiflexion 90   Right Ankle Plantar Flexion 40   Right Ankle  Inversion 28   Right Ankle Eversion 23   Left Ankle Dorsiflexion 97   Left Ankle Plantar Flexion 40   Left Ankle Inversion 34   Left Ankle Eversion 20   Cervical Flexion 45   Cervical Extension 63   Cervical - Right Side Bend 45   Cervical - Left Side Bend 38   Cervical - Right Rotation 58   Cervical - Left Rotation 59   Strength   Overall Strength Comments Both UE WNL   Ambulation/Gait   Gait Comments Normal                           PT Education - 08/21/15 1555    Education provided Yes   Education Details POC , stretch and posture   Person(s) Educated Patient   Methods Explanation;Demonstration;Tactile cues;Verbal cues;Handout   Comprehension Returned demonstration;Verbalized understanding          PT Short Term Goals - 08/21/15 1559    PT SHORT TERM GOAL #1   Title She will be independent ith inital HEP   Time 3   Period Weeks   PT SHORT TERM GOAL #2   Title She will report shoulder /neck pain eased 30% or more   Time 3   Period Weeks   Status New   PT SHORT TERM GOAL #3   Title She will demo understanding of good posture   Time 3   Period Weeks   Status New   PT SHORT TERM GOAL #4   Title She will improve RT ankle DF to 95 degree   Time 3   Period Weeks   Status New           PT Long Term Goals - 08/21/15 1600    PT LONG TERM GOAL #1   Title She will be independent with HEP issued during [place]   Time 6   Period Weeks   Status New   PT LONG TERM GOAL #2   Title She will report 60% or more 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    Time 6   Period Weeks   Status New   PT LONG TERM GOAL #4   Title She will report 50% decreased pain with standing for part time job   Time 6  Period Weeks   Status New               Plan - 08/21/15 1556    Clinical Impression  Statement Ms Dazey presents with multiple complaints/areas of pain. 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 She should improve due to recent onset .    Pt will benefit from skilled therapeutic intervention in order to improve on the following deficits Decreased range of motion;Pain;Increased muscle spasms;Decreased activity tolerance;Postural dysfunction   Rehab Potential Good   PT Frequency 2x / week   PT Duration 6 weeks   PT Treatment/Interventions Cryotherapy;Electrical Stimulation;Iontophoresis 23m/ml Dexamethasone;Moist Heat;Ultrasound;Therapeutic exercise;Manual techniques;Taping;Passive range of motion;Patient/family education   PT Next Visit Plan Manual and modalities / stretching, review stretch   Consulted and Agree with Plan of Care Patient         Problem List Patient Active Problem List   Diagnosis Date Noted  . Metatarsalgia of right foot 02/11/2015    CDarrel HooverPT 08/21/2015, 4:06 PM  [facility] [address]Phone: [phone]  Fax:  [phone] Name: PGENELLE ECONOMOUMRN: [id number] of Birth: 826-Jul-1957  PHYSICAL THERAPY DISCHARGE SUMMARY  Visits from Start of Care: Eval only  Current functional level related to goals / functional outcomes: Unknown as she did not return after evel   Remaining deficits: Unknown   Education / Equipment: HEP  Plan:                                                    Patient goals were not met. Patient is being discharged due to not returning since the last visit.  ?????   SDarrel Hoover PT  10/17/15                8:08 AM

## 2015-08-21 NOTE — Patient Instructions (Signed)
Issued from cabinet rhomboid stretch , sitting posture ,  Stretch 30 sec x 2 RT and LT 23- reps 2-3x/day

## 2015-09-25 ENCOUNTER — Ambulatory Visit (INDEPENDENT_AMBULATORY_CARE_PROVIDER_SITE_OTHER): Payer: BLUE CROSS/BLUE SHIELD | Admitting: Physician Assistant

## 2015-09-25 VITALS — BP 129/78 | HR 96 | Temp 98.1°F | Resp 18 | Ht 62.5 in | Wt 131.8 lb

## 2015-09-25 DIAGNOSIS — M62838 Other muscle spasm: Secondary | ICD-10-CM | POA: Diagnosis not present

## 2015-09-25 DIAGNOSIS — M79641 Pain in right hand: Secondary | ICD-10-CM

## 2015-09-25 DIAGNOSIS — M2550 Pain in unspecified joint: Secondary | ICD-10-CM

## 2015-09-25 DIAGNOSIS — M79642 Pain in left hand: Secondary | ICD-10-CM | POA: Diagnosis not present

## 2015-09-25 MED ORDER — CYCLOBENZAPRINE HCL 10 MG PO TABS: 10.0000 mg | ORAL_TABLET | Freq: Three times a day (TID) | ORAL | Status: DC | PRN

## 2015-09-25 MED ORDER — TRAMADOL HCL 50 MG PO TABS: 50.0000 mg | ORAL_TABLET | Freq: Three times a day (TID) | ORAL | Status: DC | PRN

## 2015-09-25 NOTE — Progress Notes (Signed)
Patient ID: Diana [name], female    DOB: June 20, 1955, 60 y.o.   MRN: [id number]  PCP: Norberto Sorenson, MD  Subjective:   Chief Complaint  Patient presents with  . Body Pain    she says because of her job, she has alot of pain all over her body  . Hip Pain  . Leg Pain  . Foot Pain    HPI Presents for evaluation of pain "all over."  She was seen here in February for similar symptoms, that resolved with the treatment provided: tramadol, indomethacin and cyclobenzaprine. She recalls calling the office to complain of an adverse effect, which she doesn't recall, and which was unfortunately, not documented in her record.  Her pain recurred this week, following all the wet weather. Her shoe inserts got wet, and she hasn't been wearing them. Carrying the heavy items for work in the cold rain seems to have exacerbated the pain. 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 She plans to resign from one once she pays off some bills that accumulated while her mother has been recovering from a MVC requiring several surgeries.  Walking causes pain in both feet, but now extending UP the LEFT leg to the buttock. The bottom of the RIGHT foot, the top of the LEFT foot hurts. Her hands and shoulders hurt. She reports that she previously wore wrist splints that helped but has lost them and would like replacements.     Review of Systems As above.    Patient Active Problem List   Diagnosis Date Noted  . Benign essential HTN 04/08/2015  . Metatarsalgia of right foot 02/11/2015  . Degeneration of intervertebral disc of lumbar region 12/18/2014  . Acid reflux 12/18/2014     Prior to Admission medications   Medication Sig Start Date End Date Taking? Authorizing Provider  cetirizine (ZYRTEC) 10 MG tablet Take 1 tablet (10 mg total) by mouth at bedtime. 07/22/15  Yes Sherren Mocha, MD  fluticasone (FLONASE) 50 MCG/ACT nasal  spray Place 2 sprays into both nostrils at bedtime. 07/22/15  Yes Sherren Mocha, MD  cyclobenzaprine (FLEXERIL) 10 MG tablet Take 1 tablet (10 mg total) by mouth 3 (three) times daily as needed for muscle spasms. Patient not taking: Reported on 08/21/2015 07/22/15   Sherren Mocha, MD  indomethacin (INDOCIN) 50 MG capsule Take 1 capsule (50 mg total) by mouth 2 (two) times daily with a meal. Patient not taking: Reported on 08/21/2015 07/22/15   Sherren Mocha, MD  traMADol (ULTRAM) 50 MG tablet Take 1 tablet (50 mg total) by mouth every 8 (eight) hours as needed. Patient not taking: Reported on 09/25/2015 07/22/15   Sherren Mocha, MD     Allergies  Allergen Reactions  . Penicillins Rash  . Codeine Anxiety       Objective:  Physical Exam  Constitutional: She is oriented to person, place, and time. She appears well-developed and well-nourished. She is active and cooperative. No distress.  BP 129/78 mmHg  Pulse 96  Temp(Src) 98.1 F (36.7 C) (Oral)  Resp 18  Ht 5' 2.5" (1.588 m)  Wt 131 lb 12.8 oz (59.784 kg)  BMI 23.71 kg/m2  SpO2 99%  HENT:  Head: Normocephalic and atraumatic.  Right Ear: Hearing normal.  Left Ear: Hearing normal.  Eyes: Conjunctivae are normal. No scleral icterus.  Neck: Normal range of motion. Neck supple. No thyromegaly  present.  Cardiovascular: Normal rate, regular rhythm and normal heart sounds.   Pulses:      Radial pulses are 2+ on the right side, and 2+ on the left side.  Pulmonary/Chest: Effort normal and breath sounds normal.  Musculoskeletal:       Right shoulder: She exhibits pain. She exhibits normal range of motion, no tenderness and no bony tenderness.       Left shoulder: She exhibits pain. She exhibits normal range of motion, no tenderness and no bony tenderness.       Right wrist: Normal.       Left wrist: Normal.       Left hip: She exhibits tenderness. She exhibits normal range of motion, normal strength and no swelling.       Cervical back: Normal.        Thoracic back: She exhibits tenderness, pain and spasm. She exhibits normal range of motion and no bony tenderness.       Lumbar back: She exhibits tenderness, pain and spasm. She exhibits normal range of motion and no bony tenderness.       Back:       Right hand: She exhibits normal range of motion, no tenderness and no bony tenderness. Normal sensation noted. Normal strength noted.       Left hand: She exhibits normal range of motion, no tenderness and no bony tenderness. Normal sensation noted. Normal strength noted.       Legs: Lymphadenopathy:       Head (right side): No tonsillar, no preauricular, no posterior auricular and no occipital adenopathy present.       Head (left side): No tonsillar, no preauricular, no posterior auricular and no occipital adenopathy present.    She has no cervical adenopathy.       Right: No supraclavicular adenopathy present.       Left: No supraclavicular adenopathy present.  Neurological: She is alert and oriented to person, place, and time. She has normal strength. No cranial nerve deficit or sensory deficit.  Reflex Scores:      Bicep reflexes are 2+ on the right side and 2+ on the left side.      Patellar reflexes are 2+ on the right side and 2+ on the left side.      Achilles reflexes are 2+ on the right side and 2+ on the left side. Skin: Skin is warm, dry and intact. No rash noted. No cyanosis or erythema. Nails show no clubbing.  Psychiatric: She has a normal mood and affect. Her speech is normal and behavior is normal.           Assessment & Plan:   1. Arthralgia 2. Muscle spasm 3. Bilateral hand pain Over use musculoskeletal pain, most likely. Out of work tomorrow and Monday (she is not scheduled to work the weekend). If unable to RTW 09/30/2015, she will RTC for re-evaluation.The wrist splints we offered are not what she had in mind, and I wonder if she did not mean fingerless gloves. If she doesn't tolerate the tramadol, she will call  and I will change the regimen to indomethacin. - traMADol (ULTRAM) 50 MG tablet; Take 1 tablet (50 mg total) by mouth every 8 (eight) hours as needed.  Dispense: 30 tablet; Refill: 0 - cyclobenzaprine (FLEXERIL) 10 MG tablet; Take 1 tablet (10 mg total) by mouth 3 (three) times daily as needed for muscle spasms.  Dispense: 30 tablet; Refill: 2   Follow-up with Dr. Clelia Croft in August,  as planned.    Fernande Brashelle S. Graceann Boileau, PA-C Physician Assistant-Certified [facility]

## 2015-09-25 NOTE — Patient Instructions (Addendum)
Please let me know if you do not tolerate these medications. We can change the tramadol to indomethacin if needed.  Please be sure to drink plenty of water and get good rest.  Contact the podiatry office tomorrow to ask about the shoe inserts and whether they may be damaged by getting so wet. If not, please resume using them as soon as they are dry!    IF you received an x-ray today, you will receive an invoice from [facility]. Please contact [facility] at [phone] with questions or concerns regarding your invoice.   IF you received labwork today, you will receive an invoice from [facility]. Please contact [facility] at [phone] with questions or concerns regarding your invoice.   Our billing staff will not be able to assist you with questions regarding bills from these companies.  You will be contacted with the lab results as soon as they are available. The fastest way to get your results is to activate your My Chart account. Instructions are located on the last page of this paperwork. If you have not heard from us regarding the results in 2 weeks, please contact this office.

## 2015-10-23 ENCOUNTER — Ambulatory Visit (INDEPENDENT_AMBULATORY_CARE_PROVIDER_SITE_OTHER): Payer: BLUE CROSS/BLUE SHIELD | Admitting: Physician Assistant

## 2015-10-23 VITALS — BP 118/70 | HR 102 | Temp 98.2°F | Resp 18 | Ht 62.5 in | Wt 131.0 lb

## 2015-10-23 DIAGNOSIS — M7742 Metatarsalgia, left foot: Secondary | ICD-10-CM | POA: Diagnosis not present

## 2015-10-23 DIAGNOSIS — M79642 Pain in left hand: Secondary | ICD-10-CM | POA: Diagnosis not present

## 2015-10-23 MED ORDER — MELOXICAM 15 MG PO TABS: 15.0000 mg | ORAL_TABLET | Freq: Every day | ORAL | Status: DC

## 2015-10-23 NOTE — Progress Notes (Signed)
Patient ID: Diana [name], female    DOB: 10/21/1955, 60 y.o.   MRN: [id number]  PCP: Norberto SorensonSHAW,EVA, MD  Subjective:   Chief Complaint  Patient presents with  . Foot Pain    left foot pain and left hand pain    HPI Presents for evaluation of hand and foot pain.  Some improved initially with the last treatment 4/27, but when she returned to work, the pain recurred and is progressing, especially with the wet weather again. It's not as bad as it was last month, only hurting on the LEFT side, but extends all the way up the leg to the buttock. 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 The tramadol and cyclobenzaprine didn't really help. PT referall was placed, and she was evaluated 3/23, but she couldn't afford the copay TIW. We think that she had an adverse effect from the indomethacin Rx'd 2/21, but aren't certain. She has tolerated meloxicam in the past.     Review of Systems As above    Patient Active Problem List   Diagnosis Date Noted  . Benign essential HTN 04/08/2015  . Metatarsalgia of right foot 02/11/2015  . Degeneration of intervertebral disc of lumbar region 12/18/2014  . Acid reflux 12/18/2014     Prior to Admission medications   Medication Sig Start Date End Date Taking? Authorizing Provider  cyclobenzaprine (FLEXERIL) 10 MG tablet Take 1 tablet (10 mg total) by mouth 3 (three) times daily as needed for muscle spasms. 09/25/15  Yes Julyan Gales, PA-C  fluticasone (FLONASE) 50 MCG/ACT nasal spray Place 2 sprays into both nostrils at bedtime. 07/22/15  Yes Sherren MochaEva N Shaw, MD  cetirizine (ZYRTEC) 10 MG tablet Take 1 tablet (10 mg total) by mouth at bedtime. Patient not taking: Reported on 10/23/2015 07/22/15   Sherren MochaEva N Shaw, MD     Allergies  Allergen Reactions  . Penicillins Rash  . Codeine Anxiety       Objective:  Physical Exam  Constitutional: She is oriented to person, place, and time.  She appears well-developed and well-nourished. She is active and cooperative. No distress.  BP 118/70 mmHg  Pulse 102  Temp(Src) 98.2 F (36.8 C) (Oral)  Resp 18  Ht 5' 2.5" (1.588 m)  Wt 131 lb (59.421 kg)  BMI 23.56 kg/m2  SpO2 99%  HENT:  Head: Normocephalic and atraumatic.  Right Ear: Hearing normal.  Left Ear: Hearing normal.  Eyes: Conjunctivae are normal. No scleral icterus.  Neck: Normal range of motion. Neck supple. No thyromegaly present.  Cardiovascular: Normal rate, regular rhythm and normal heart sounds.   Pulses:      Radial pulses are 2+ on the right side, and 2+ on the left side.  Pulmonary/Chest: Effort normal and breath sounds normal.  Musculoskeletal:       Left ankle: Normal. Achilles tendon normal.       Right hand: Normal.       Left hand: She exhibits tenderness and bony tenderness. She exhibits normal range of motion, normal capillary refill, no deformity, no laceration and no swelling. Decreased sensation noted. Normal strength noted.       Hands:      Left foot: There is tenderness and bony tenderness. There is normal range of motion, no swelling, normal capillary refill, no crepitus, no deformity and no laceration.       Feet:  Lymphadenopathy:       Head (right side):  No tonsillar, no preauricular, no posterior auricular and no occipital adenopathy present.       Head (left side): No tonsillar, no preauricular, no posterior auricular and no occipital adenopathy present.    She has no cervical adenopathy.       Right: No supraclavicular adenopathy present.       Left: No supraclavicular adenopathy present.  Neurological: She is alert and oriented to person, place, and time. No sensory deficit.  Skin: Skin is warm, dry and intact. No rash noted. No cyanosis or erythema. Nails show no clubbing.  Psychiatric: She has a normal mood and affect. Her speech is normal and behavior is normal.           Assessment & Plan:   1. Metatarsalgia of left  foot We discussed podiatry, but she's going to try meloxicam first. - meloxicam (MOBIC) 15 MG tablet; Take 1 tablet (15 mg total) by mouth daily.  Dispense: 30 tablet; Refill: 1  2. Left hand pain Meloxicam, hand specialty evaluation. She reports having had a splint previously, that covered the MTPs and helped. We do not supply such a splint, though she recalls having received it here. I wonder if it was a regular wrist splint but in a size too large for her such that it extended over the MTPs. Hand will likely make a custom splint if they recommend one. - meloxicam (MOBIC) 15 MG tablet; Take 1 tablet (15 mg total) by mouth daily.  Dispense: 30 tablet; Refill: 1 - Ambulatory referral to Hand Surgery   Fernande Bras, PA-C Physician Assistant-Certified [facility]

## 2015-10-23 NOTE — Patient Instructions (Addendum)
Call Dr. Beverlee Nimsegal's office to schedule a follow-up visit regarding the foot pain.  Try the meloxicam (Mobic) for the pain in both the hand and the foot.     IF you received an x-ray today, you will receive an invoice from [facility]. Please contact [facility] at [phone] with questions or concerns regarding your invoice.   IF you received labwork today, you will receive an invoice from [facility]. Please contact [facility] at [phone] with questions or concerns regarding your invoice.   Our billing staff will not be able to assist you with questions regarding bills from these companies.  You will be contacted with the lab results as soon as they are available. The fastest way to get your results is to activate your My Chart account. Instructions are located on the last page of this paperwork. If you have not heard from us regarding the results in 2 weeks, please contact this office.

## 2015-12-18 ENCOUNTER — Other Ambulatory Visit: Payer: Self-pay | Admitting: Orthopedic Surgery

## 2015-12-18 ENCOUNTER — Encounter: Payer: Self-pay | Admitting: Physician Assistant

## 2015-12-18 DIAGNOSIS — M542 Cervicalgia: Secondary | ICD-10-CM

## 2015-12-18 DIAGNOSIS — G5603 Carpal tunnel syndrome, bilateral upper limbs: Secondary | ICD-10-CM

## 2015-12-18 DIAGNOSIS — M189 Osteoarthritis of first carpometacarpal joint, unspecified: Secondary | ICD-10-CM | POA: Insufficient documentation

## 2015-12-18 DIAGNOSIS — M1810 Unilateral primary osteoarthritis of first carpometacarpal joint, unspecified hand: Secondary | ICD-10-CM

## 2015-12-18 DIAGNOSIS — G5600 Carpal tunnel syndrome, unspecified upper limb: Secondary | ICD-10-CM | POA: Insufficient documentation

## 2016-01-29 ENCOUNTER — Encounter: Payer: Self-pay | Admitting: Family Medicine

## 2016-01-29 ENCOUNTER — Ambulatory Visit (INDEPENDENT_AMBULATORY_CARE_PROVIDER_SITE_OTHER): Payer: BLUE CROSS/BLUE SHIELD | Admitting: Family Medicine

## 2016-01-29 VITALS — BP 122/76 | HR 95 | Temp 98.0°F | Resp 18 | Ht 62.5 in | Wt 129.0 lb

## 2016-01-29 DIAGNOSIS — M79642 Pain in left hand: Secondary | ICD-10-CM

## 2016-01-29 DIAGNOSIS — Z136 Encounter for screening for cardiovascular disorders: Secondary | ICD-10-CM

## 2016-01-29 DIAGNOSIS — Z1211 Encounter for screening for malignant neoplasm of colon: Secondary | ICD-10-CM | POA: Diagnosis not present

## 2016-01-29 DIAGNOSIS — Z124 Encounter for screening for malignant neoplasm of cervix: Secondary | ICD-10-CM

## 2016-01-29 DIAGNOSIS — Z113 Encounter for screening for infections with a predominantly sexual mode of transmission: Secondary | ICD-10-CM

## 2016-01-29 DIAGNOSIS — K219 Gastro-esophageal reflux disease without esophagitis: Secondary | ICD-10-CM | POA: Diagnosis not present

## 2016-01-29 DIAGNOSIS — Z1329 Encounter for screening for other suspected endocrine disorder: Secondary | ICD-10-CM | POA: Diagnosis not present

## 2016-01-29 DIAGNOSIS — M2550 Pain in unspecified joint: Secondary | ICD-10-CM | POA: Diagnosis not present

## 2016-01-29 DIAGNOSIS — Z1321 Encounter for screening for nutritional disorder: Secondary | ICD-10-CM | POA: Diagnosis not present

## 2016-01-29 DIAGNOSIS — Z0000 Encounter for general adult medical examination without abnormal findings: Secondary | ICD-10-CM

## 2016-01-29 DIAGNOSIS — I10 Essential (primary) hypertension: Secondary | ICD-10-CM

## 2016-01-29 DIAGNOSIS — Z862 Personal history of diseases of the blood and blood-forming organs and certain disorders involving the immune mechanism: Secondary | ICD-10-CM

## 2016-01-29 DIAGNOSIS — Z130 Encounter for screening for diseases of the blood and blood-forming organs and certain disorders involving the immune mechanism: Secondary | ICD-10-CM | POA: Diagnosis not present

## 2016-01-29 DIAGNOSIS — Z1212 Encounter for screening for malignant neoplasm of rectum: Secondary | ICD-10-CM

## 2016-01-29 DIAGNOSIS — M62838 Other muscle spasm: Secondary | ICD-10-CM

## 2016-01-29 DIAGNOSIS — Z1239 Encounter for other screening for malignant neoplasm of breast: Secondary | ICD-10-CM | POA: Diagnosis not present

## 2016-01-29 DIAGNOSIS — Z1389 Encounter for screening for other disorder: Secondary | ICD-10-CM | POA: Diagnosis not present

## 2016-01-29 DIAGNOSIS — H6123 Impacted cerumen, bilateral: Secondary | ICD-10-CM | POA: Diagnosis not present

## 2016-01-29 DIAGNOSIS — M7742 Metatarsalgia, left foot: Secondary | ICD-10-CM

## 2016-01-29 DIAGNOSIS — Z1383 Encounter for screening for respiratory disorder NEC: Secondary | ICD-10-CM | POA: Diagnosis not present

## 2016-01-29 DIAGNOSIS — M7711 Lateral epicondylitis, right elbow: Secondary | ICD-10-CM

## 2016-01-29 LAB — POCT URINALYSIS DIP (MANUAL ENTRY)
BILIRUBIN UA: NEGATIVE
GLUCOSE UA: NEGATIVE
Ketones, POC UA: NEGATIVE
Leukocytes, UA: NEGATIVE
NITRITE UA: NEGATIVE
Protein Ur, POC: 30 — AB
SPEC GRAV UA: 1.02
Urobilinogen, UA: 0.2
pH, UA: 5.5

## 2016-01-29 LAB — COMPREHENSIVE METABOLIC PANEL
ALT: 14 U/L (ref 6–29)
AST: 21 U/L (ref 10–35)
Albumin: 4.2 g/dL (ref 3.6–5.1)
Alkaline Phosphatase: 72 U/L (ref 33–130)
BUN: 17 mg/dL (ref 7–25)
CALCIUM: 9.7 mg/dL (ref 8.6–10.4)
CHLORIDE: 103 mmol/L (ref 98–110)
CO2: 25 mmol/L (ref 20–31)
Creat: 0.49 mg/dL — ABNORMAL LOW (ref 0.50–1.05)
GLUCOSE: 76 mg/dL (ref 65–99)
POTASSIUM: 3.8 mmol/L (ref 3.5–5.3)
Sodium: 140 mmol/L (ref 135–146)
Total Bilirubin: 0.5 mg/dL (ref 0.2–1.2)
Total Protein: 7.7 g/dL (ref 6.1–8.1)

## 2016-01-29 LAB — LIPID PANEL
CHOLESTEROL: 212 mg/dL — AB (ref 125–200)
HDL: 74 mg/dL (ref >=46)
LDL CALC: 129 mg/dL (ref <130)
TRIGLYCERIDES: 47 mg/dL (ref <150)
Total CHOL/HDL Ratio: 2.9 Ratio (ref <=5.0)
VLDL: 9 mg/dL (ref <30)

## 2016-01-29 LAB — CBC
HEMATOCRIT: 34.4 % — AB (ref 35.0–45.0)
Hemoglobin: 10.7 g/dL — ABNORMAL LOW (ref 11.7–15.5)
MCH: 23.8 pg — AB (ref 27.0–33.0)
MCHC: 31.1 g/dL — AB (ref 32.0–36.0)
MCV: 76.6 fL — AB (ref 80.0–100.0)
MPV: 9.9 fL (ref 7.5–12.5)
PLATELETS: 243 10*3/uL (ref 140–400)
RBC: 4.49 MIL/uL (ref 3.80–5.10)
RDW: 15.3 % — AB (ref 11.0–15.0)
WBC: 3.5 10*3/uL — ABNORMAL LOW (ref 3.8–10.8)

## 2016-01-29 LAB — TSH: TSH: 0.66 mIU/L

## 2016-01-29 MED ORDER — CETIRIZINE HCL 10 MG PO TABS: 10.0000 mg | ORAL_TABLET | Freq: Every day | ORAL | 3 refills | Status: DC

## 2016-01-29 MED ORDER — MELOXICAM 15 MG PO TABS: 15.0000 mg | ORAL_TABLET | Freq: Every day | ORAL | 5 refills | Status: DC

## 2016-01-29 MED ORDER — CYCLOBENZAPRINE HCL 10 MG PO TABS: 10.0000 mg | ORAL_TABLET | Freq: Three times a day (TID) | ORAL | 2 refills | Status: DC | PRN

## 2016-01-29 MED ORDER — FLUTICASONE PROPIONATE 50 MCG/ACT NA SUSP: 2.0000 | Freq: Every day | NASAL | 11 refills | Status: DC

## 2016-01-29 NOTE — Progress Notes (Signed)
Subjective:    Patient ID: Diana [name], female    DOB: September 20, 1955, 60 y.o.   MRN: [id number] Chief Complaint  Patient presents with  . Annual Exam    HPI  Diana [name] is a 60 yo woman here today for a complete physical and wellness exam.  She has not had a prior CPE in Epic. She has carpal tunnel surgery sched for 2 wks.    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  She has started getting reguklarly massages monthly. But not helping back or neck. Chronic medical conditions:  HTN: no checking.  GERD: none  Arthralgias/lumbar DDD: Normal inflammatory levels.  She is still having a lot of neck pain and shoulder pain.  She had xrays of her c-spine done about a months ago.  She feels like she has problems swallowing. C/o nerve  She is stillhaving problems on her feet and is thinking about surgery She is having numbness in te side of the legs. Having cts surg on left as she can't close fist all the way and has to physically straighten it.   Had c-spine xray and hand xray at [facility] - reports in dr Sheran Fava note  Foot still botheirng her - not sur eif arthritis or gout - wearing the right shoes - injections and shoes did not help veyr mush. Then they got wet and so she has to get new ones but unable to afford  Depression screen [facility] 2/9 01/29/2016 10/23/2015 09/25/2015 07/22/2015 05/21/2015  Decreased Interest 0 0 0 0 0  Down, Depressed, Hopeless 0 0 0 0 0  PHQ - 2 Score 0 0 0 0 0   Past Medical History:  Diagnosis Date  . Allergy    Past Surgical History:  Procedure Laterality Date  . TUBAL LIGATION    . WRIST SURGERY  Right    No current outpatient prescriptions on file prior to visit.   No current facility-administered medications on file prior to visit.    Allergies  Allergen Reactions  . Penicillins Rash  . Codeine Anxiety   Family History  Problem Relation Age of Onset  . Thyroid disease Mother   . Brain cancer Sister    Social History   Social History  . Marital status: Married    Spouse name: Alcario Drought  . Number of children: 2  . Years of education: 12th grade   Occupational History  . floor tech    Social History Main Topics  . Smoking status: Never Smoker  . Smokeless tobacco: Never Used  . Alcohol use No  . Drug use: No  . Sexual activity: Not Asked   Other Topics Concern  . None   Social History Narrative   Divorced from her husband. She has two adult sons from that union . One lives in [place], the other lives in [place] (as a Solicitor) following Office manager.   Married Alcario Drought 05/2014.    Review of Systems  All other systems reviewed and are negative.      Objective:   Physical Exam  Constitutional: She is oriented to person, place, and time.  She appears well-developed and well-nourished. No distress.  HENT:  Head: Normocephalic and atraumatic.  Right Ear: Tympanic membrane, external ear and ear canal normal.  Left Ear: Tympanic membrane, external ear and ear canal normal.  Nose: Nose normal. No mucosal edema or rhinorrhea.  Mouth/Throat: Uvula is midline, oropharynx is clear and moist and mucous membranes are normal. No posterior oropharyngeal erythema.  Eyes: Conjunctivae and EOM are normal. Pupils are equal, round, and reactive to light. Right eye exhibits no discharge. Left eye exhibits no discharge. No scleral icterus.  Neck: Normal range of motion. Neck supple. No thyromegaly present.  Cardiovascular: Normal rate, regular rhythm, normal heart sounds and intact distal pulses.   Pulmonary/Chest: Effort normal and breath sounds normal. No respiratory distress.    Abdominal: Soft. Bowel sounds are normal. There is no tenderness.  Genitourinary: No breast swelling, tenderness, discharge or bleeding.  Musculoskeletal: She exhibits no edema.  Lymphadenopathy:    She has no cervical adenopathy.  Neurological: She is alert and oriented to person, place, and time. She has normal reflexes.  Skin: Skin is warm and dry. She is not diaphoretic. No erythema.  Psychiatric: She has a normal mood and affect. Her behavior is normal.      BP 122/76 (BP Location: Right Arm, Patient Position: Sitting, Cuff Size: Small)   Pulse 95   Temp 98 F (36.7 C) (Oral)   Resp 18   Ht 5' 2.5" (1.588 m)   Wt 129 lb (58.5 kg)   SpO2 98%   BMI 23.22 kg/m   [facility] reading (PRIMARY) by  Dr. Clelia CroftShaw. EKG: NSR, no acute ischemic changes    Assessment & Plan:   Needs flu shot Cmp, ua, cbc, lipid, tsh, vit D pap, ekg since pre-op  Her mom also saw Dr. Merlyn LotKuzma and had a breast drained -  Bryan LemmaMarion Gregory 07/19/42 - surgery meloxicam quesitons about supplements to help with arthritis  1. Annual physical exam   2. Routine screening for STI (sexually transmitted infection)   3. Screening for breast cancer   4. Screening for cardiovascular, respiratory, and genitourinary diseases   5. Screening for cervical cancer - pt refuses pap today but is due - ok with doing at f/u when we have more time  6. Screening for colorectal cancer - Refer for colonoscopy  7. Screening for deficiency anemia   8. Screening for thyroid disorder   9. Benign essential HTN   10. Gastroesophageal reflux disease, esophagitis presence not specified   11. History of anemia   12. Encounter for vitamin deficiency screening   13. Arthralgia   14. Metatarsalgia of left foot   15. Left hand pain   16. Muscle spasm   17. Cerumen impaction, bilateral   18. Right lateral epicondylitis     Orders Placed This Encounter  Procedures  . MM Digital Screening    Standing Status:   Future    Standing Expiration  Date:   03/30/2017    Order Specific Question:   Reason for Exam (SYMPTOM  OR DIAGNOSIS REQUIRED)    Answer:   screening    Order Specific Question:   Is the patient pregnant?    Answer:   No    Order Specific Question:   Preferred imaging location?    Answer:   [facility]  . CBC  . Comprehensive metabolic panel    Order Specific Question:   Has the patient fasted?    Answer:   Yes  . TSH  . VITAMIN  D 25 Hydroxy (Vit-D Deficiency, Fractures)  . Lipid panel    Order Specific Question:   Has the patient fasted?    Answer:   Yes  . Care order/instruction:    AVS and GO after labs and EKG. Please give samples in the door.    Scheduling Instructions:     AVS and GO  . POCT urinalysis dipstick  . EKG 12-Lead    Meds ordered this encounter  Medications  . meloxicam (MOBIC) 15 MG tablet    Sig: Take 1 tablet (15 mg total) by mouth daily.    Dispense:  30 tablet    Refill:  5  . cyclobenzaprine (FLEXERIL) 10 MG tablet    Sig: Take 1 tablet (10 mg total) by mouth 3 (three) times daily as needed for muscle spasms.    Dispense:  60 tablet    Refill:  2  . fluticasone (FLONASE) 50 MCG/ACT nasal spray    Sig: Place 2 sprays into both nostrils at bedtime.    Dispense:  15.8 g    Refill:  11  . cetirizine (ZYRTEC) 10 MG tablet    Sig: Take 1 tablet (10 mg total) by mouth at bedtime.    Dispense:  90 tablet    Refill:  3    Norberto Sorenson, M.D.  [facility] [address] [place], [place] [address] [phone] phone [phone] fax  02/02/16 10:32 PM

## 2016-01-29 NOTE — Patient Instructions (Addendum)
IF you received an x-ray today, you will receive an invoice from [facility]. Please contact [facility] at [phone] with questions or concerns regarding your invoice.   IF you received labwork today, you will receive an invoice from [employer]. Please contact [facility] at [phone] with questions or concerns regarding your invoice.   Our billing staff will not be able to assist you with questions regarding bills from these companies.  You will be contacted with the lab results as soon as they are available. The fastest way to get your results is to activate your My Chart account. Instructions are located on the last page of this paperwork. If you have not heard from [place] regarding the results in 2 weeks, please contact this office.    Eating Healthy on a Budget There are many ways to save money at the grocery store and continue to eat healthy. You can be successful if you plan your meals according to your budget, purchase according to your budget and grocery list, and prepare food yourself.  How can I buy more food on a limited budget? Plan  Plan meals and snacks according to a grocery list and budget you create.  Look for recipes where you can cook once and make enough food for two meals.  Include meals that will "stretch" more expensive foods such as stews, casseroles, and stir-fry dishes.  Make a grocery list and make sure to bring it with you to the store. If you have a smart phone, you could use your phone to create your shopping list. Purchase  When grocery shopping, buy only the items on your grocery list and go only to the areas of the store that have the items on your list. Prepare  Some meal items can be prepared in advance. Pre-cook on days when you have extra time.  Make extra food (such as by doubling recipes) and freeze the extras in meal-sized containers or in individual portions for fast meals and snacks.  Use  leftovers in your meal plan for the week.  Try some meatless meals or try "no cook" meals like salads.  When you come home from the grocery store, wash and prepare your fruits and vegetables so they are ready to use and eat. This will help reduce food waste. How can I buy more food on a limited budget?  Try these tips the next time you go shopping:   [place] store brands or generic brands.  Use coupons only for foods and brands you normally buy. Avoid buying items you wouldn't normally buy simply because they are on sale.  Check online and in newspapers for weekly deals.  Buy healthy items from the bulk bins when available, such as herbs, spices, flours, pastas, nuts, and dried fruit.  Buy fruits and vegetables that are in season. Prices are usually lower on in-season produce.  Compare and contrast different items. You can do this by looking at the unit price on the price tag. Use it to compare different brands and sizes to find out which item is the best deal.  Choose naturally low-cost healthy items, such as carrots, potatoes, apples, bananas, and oranges. Dried or canned beans are a low-cost protein source.  Buy in bulk and freeze extra food. Items you can buy in bulk include meats, fish, poultry, frozen fruits, and frozen vegetables.  Limit the purchase of prepared or "ready-to-eat" foods, such as pre-cut fruits and vegetables and pre-made salads.  If possible, shop  around to discover which grocery store offers the best prices. Some stores charge much more than other stores for the same items.  Do not shop when you are hungry. If you shop while hungry, It may be hard to stick to your list and budget.  Stick to your list and resist impulse buys. Treat your list as your official plan for the week.  Buy a variety of vegetables and fruit by purchasing fresh, frozen, and canned items.  Look beyond eye level. Foods at eye level (adult or child eye level) are more expensive. Look at the  top and bottom shelves for deals.  Be efficient with your time when shopping. The more time you spend at the store, the more money you are likely to spend.  Consider other retailers such as dollar stores, larger [employer], local fruit and vegetable stands, and farmers markets. What are some tips for less expensive food substitutions? When choosing more expensive foods like meats and dairy, try these tips to save money:  Choose cheaper cuts of meat, such as bone-in chicken thighs and drumsticks instead skinless and boneless chicken. When you are ready to prepare the chicken, you can remove the skin yourself to make it healthier.  Choose lean meats like chicken or [place]. When choosing ground beef, make sure it is lean ground beef (92% lean, 8% fat). If you do buy a fattier ground beef, drain the fat before eating.  Buy dried beans and peas, such as lentils, split peas, or kidney beans.  For seafood, choose canned tuna, salmon, or sardines.  Eggs are a low-cost source of protein.  Buy the larger tubs of yogurt instead of individual-sized containers.  Choose water instead of sodas and other sweetened beverages.  Skip buying chips, cookies, and other "junk food". These items are usually expensive, high in calories, and low in nutritional value. How can I prepare the foods I buy in the healthiest way? Practice these tips for cooking foods in the healthiest way to reduce excess fat and calorie intake:  Steam, saute, grill, or bake foods instead of frying them.  Make sure half your plate is filled with fruits or vegetables. Choose from fresh, frozen, or canned fruits and vegetables. If eating canned, remember to rinse them before eating. This will remove any excess salt added for packaging.  Trim all fat from meat before cooking. Remove the skin from chicken or [place].  Spoon off fat from meat dishes once they have been chilled in the refrigerator and the fat has hardened on the  top.  Use skim milk, low-fat milk, or evaporated skim milk when making cream sauces, soups, or puddings.  Substitute low-fat yogurt, sour cream, or cottage cheese for sour cream and mayonnaise in dips and dressings.  Try lemon juice, herbs, or spices to season food instead of salt, butter, or margarine.   This information is not intended to replace advice given to you by your health care provider. Make sure you discuss any questions you have with your health care provider.   Document Released: 01/18/2014 Document Reviewed: 01/18/2014 Elsevier Interactive Patient Education [employer]. Menopause is a normal process in which your reproductive ability comes to an end. This process happens gradually over a span of months to years, usually between the ages of 44 and 3. Menopause is complete when you have missed 12 consecutive menstrual periods. It is important to talk with your health care provider about some of the most common conditions that affect postmenopausal women,  such as heart disease, cancer, and bone loss (osteoporosis). Adopting a healthy lifestyle and getting preventive care can help to promote your health and wellness. Those actions can also lower your chances of developing some of these common conditions. WHAT SHOULD I KNOW ABOUT MENOPAUSE? During menopause, you may experience a number of symptoms, such as:  Moderate-to-severe hot flashes.  Night sweats.  Decrease in sex drive.  Mood swings.  Headaches.  Tiredness.  Irritability.  Memory problems.  Insomnia. Choosing to treat or not to treat menopausal changes is an individual decision that you make with your health care provider. WHAT SHOULD I KNOW ABOUT HORMONE REPLACEMENT THERAPY AND SUPPLEMENTS? Hormone therapy products are effective for treating symptoms that are associated with menopause, such as hot flashes and night sweats. Hormone replacement carries certain risks, especially as you become older. If you  are thinking about using estrogen or estrogen with progestin treatments, discuss the benefits and risks with your health care provider. WHAT SHOULD I KNOW ABOUT HEART DISEASE AND STROKE? Heart disease, heart attack, and stroke become more likely as you age. This may be due, in part, to the hormonal changes that your body experiences during menopause. These can affect how your body processes dietary fats, triglycerides, and cholesterol. Heart attack and stroke are both medical emergencies. There are many things that you can do to help prevent heart disease and stroke:  Have your blood pressure checked at least every 1-2 years. High blood pressure causes heart disease and increases the risk of stroke.  If you are 64-59 years old, ask your health care provider if you should take aspirin to prevent a heart attack or a stroke.  Do not use any tobacco products, including cigarettes, chewing tobacco, or electronic cigarettes. If you need help quitting, ask your health care provider.  It is important to eat a healthy diet and maintain a healthy weight.  Be sure to include plenty of vegetables, fruits, low-fat dairy products, and lean protein.  Avoid eating foods that are high in solid fats, added sugars, or salt (sodium).  Get regular exercise. This is one of the most important things that you can do for your health.  Try to exercise for at least 150 minutes each week. The type of exercise that you do should increase your heart rate and make you sweat. This is known as moderate-intensity exercise.  Try to do strengthening exercises at least twice each week. Do these in addition to the moderate-intensity exercise.  Know your numbers.Ask your health care provider to check your cholesterol and your blood glucose. Continue to have your blood tested as directed by your health care provider. WHAT SHOULD I KNOW ABOUT CANCER SCREENING? There are several types of cancer. Take the following steps to reduce  your risk and to catch any cancer development as early as possible. Breast Cancer  Practice breast self-awareness.  This means understanding how your breasts normally appear and feel.  It also means doing regular breast self-exams. Let your health care provider know about any changes, no matter how small.  If you are 46 or older, have a clinician do a breast exam (clinical breast exam or CBE) every year. Depending on your age, family history, and medical history, it may be recommended that you also have a yearly breast X-ray (mammogram).  If you have a family history of breast cancer, talk with your health care provider about genetic screening.  If you are at high risk for breast cancer, talk with your health  care provider about having an MRI and a mammogram every year.  Breast cancer (BRCA) gene test is recommended for women who have family members with BRCA-related cancers. Results of the assessment will determine the need for genetic counseling and BRCA1 and for BRCA2 testing. BRCA-related cancers include these types:  Breast. This occurs in males or females.  Ovarian.  Tubal. This may also be called fallopian tube cancer.  Cancer of the abdominal or pelvic lining (peritoneal cancer).  Prostate.  Pancreatic. Cervical, Uterine, and Ovarian Cancer Your health care provider may recommend that you be screened regularly for cancer of the pelvic organs. These include your ovaries, uterus, and vagina. This screening involves a pelvic exam, which includes checking for microscopic changes to the surface of your cervix (Pap test).  For women ages 21-65, health care providers may recommend a pelvic exam and a Pap test every three years. For women ages 39-65, they may recommend the Pap test and pelvic exam, combined with testing for human papilloma virus (HPV), every five years. Some types of HPV increase your risk of cervical cancer. Testing for HPV may also be done on women of any age who  have unclear Pap test results.  Other health care providers may not recommend any screening for nonpregnant women who are considered low risk for pelvic cancer and have no symptoms. Ask your health care provider if a screening pelvic exam is right for you.  If you have had past treatment for cervical cancer or a condition that could lead to cancer, you need Pap tests and screening for cancer for at least 20 years after your treatment. If Pap tests have been discontinued for you, your risk factors (such as having a new sexual partner) need to be reassessed to determine if you should start having screenings again. Some women have medical problems that increase the chance of getting cervical cancer. In these cases, your health care provider may recommend that you have screening and Pap tests more often.  If you have a family history of uterine cancer or ovarian cancer, talk with your health care provider about genetic screening.  If you have vaginal bleeding after reaching menopause, tell your health care provider.  There are currently no reliable tests available to screen for ovarian cancer. Lung Cancer Lung cancer screening is recommended for adults 28-87 years old who are at high risk for lung cancer because of a history of smoking. A yearly low-dose CT scan of the lungs is recommended if you:  Currently smoke.  Have a history of at least 30 pack-years of smoking and you currently smoke or have quit within the past 15 years. A pack-year is smoking an average of one pack of cigarettes per day for one year. Yearly screening should:  Continue until it has been 15 years since you quit.  Stop if you develop a health problem that would prevent you from having lung cancer treatment. Colorectal Cancer  This type of cancer can be detected and can often be prevented.  Routine colorectal cancer screening usually begins at age 37 and continues through age 33.  If you have risk factors for colon  cancer, your health care provider may recommend that you be screened at an earlier age.  If you have a family history of colorectal cancer, talk with your health care provider about genetic screening.  Your health care provider may also recommend using home test kits to check for hidden blood in your stool.  A small camera at the  end of a tube can be used to examine your colon directly (sigmoidoscopy or colonoscopy). This is done to check for the earliest forms of colorectal cancer.  Direct examination of the colon should be repeated every 5-10 years until age 31. However, if early forms of precancerous polyps or small growths are found or if you have a family history or genetic risk for colorectal cancer, you may need to be screened more often. Skin Cancer  Check your skin from head to toe regularly.  Monitor any moles. Be sure to tell your health care provider:  About any new moles or changes in moles, especially if there is a change in a mole's shape or color.  If you have a mole that is larger than the size of a pencil eraser.  If any of your family members has a history of skin cancer, especially at a young age, talk with your health care provider about genetic screening.  Always use sunscreen. Apply sunscreen liberally and repeatedly throughout the day.  Whenever you are outside, protect yourself by wearing long sleeves, pants, a wide-brimmed hat, and sunglasses. WHAT SHOULD I KNOW ABOUT OSTEOPOROSIS? Osteoporosis is a condition in which bone destruction happens more quickly than new bone creation. After menopause, you may be at an increased risk for osteoporosis. To help prevent osteoporosis or the bone fractures that can happen because of osteoporosis, the following is recommended:  If you are 46-31 years old, get at least 1,000 mg of calcium and at least 600 mg of vitamin D per day.  If you are older than age 9 but younger than age 24, get at least 1,200 mg of calcium and at  least 600 mg of vitamin D per day.  If you are older than age 7, get at least 1,200 mg of calcium and at least 800 mg of vitamin D per day. Smoking and excessive alcohol intake increase the risk of osteoporosis. Eat foods that are rich in calcium and vitamin D, and do weight-bearing exercises several times each week as directed by your health care provider. WHAT SHOULD I KNOW ABOUT HOW MENOPAUSE AFFECTS [place]? Depression may occur at any age, but it is more common as you become older. Common symptoms of depression include:  Low or sad mood.  Changes in sleep patterns.  Changes in appetite or eating patterns.  Feeling an overall lack of motivation or enjoyment of activities that you previously enjoyed.  Frequent crying spells. Talk with your health care provider if you think that you are experiencing depression. WHAT SHOULD I KNOW ABOUT IMMUNIZATIONS? It is important that you get and maintain your immunizations. These include:  Tetanus, diphtheria, and pertussis (Tdap) booster vaccine.  Influenza every year before the flu season begins.  Pneumonia vaccine.  Shingles vaccine. Your health care provider may also recommend other immunizations.   This information is not intended to replace advice given to you by your health care provider. Make sure you discuss any questions you have with your health care provider.   Document Released: 07/09/2005 Document Revised: 06/07/2014 Document Reviewed: 01/17/2014 Elsevier Interactive Patient Education [employer].

## 2016-01-30 LAB — VITAMIN D 25 HYDROXY (VIT D DEFICIENCY, FRACTURES): Vit D, 25-Hydroxy: 21 ng/mL — ABNORMAL LOW (ref 30–100)

## 2016-02-09 ENCOUNTER — Encounter (HOSPITAL_BASED_OUTPATIENT_CLINIC_OR_DEPARTMENT_OTHER): Payer: Self-pay | Admitting: *Deleted

## 2016-02-11 ENCOUNTER — Telehealth: Payer: Self-pay

## 2016-02-11 NOTE — Telephone Encounter (Signed)
Call patient with lab results.  She asks that you call next week so that she would be able to answer. [phone]

## 2016-02-11 NOTE — Anesthesia Preprocedure Evaluation (Addendum)
Anesthesia Evaluation  Patient identified by MRN, date of birth, ID band Patient awake    Reviewed: Allergy & Precautions, NPO status , Patient's Chart, lab work & pertinent test results  History of Anesthesia Complications Negative for: history of anesthetic complications  Airway Mallampati: I  TM Distance: >3 FB Neck ROM: Full    Dental  (+) Dental Advisory Given,    Pulmonary neg shortness of breath, neg sleep apnea, neg COPD, neg recent URI,  Chronic bronchitis   Pulmonary exam normal breath sounds clear to auscultation       Cardiovascular hypertension, (-) angina(-) Past MI, (-) Cardiac Stents, (-) CABG and (-) Orthopnea  Rhythm:Regular Rate:Normal     Neuro/Psych neg Seizures    GI/Hepatic Neg liver ROS, GERD  Controlled,  Endo/Other  negative endocrine ROS  Renal/GU negative Renal ROS     Musculoskeletal  (+) Arthritis , cervicalgia   Abdominal   Peds  Hematology negative hematology ROS (+)   Anesthesia Other Findings   Reproductive/Obstetrics                            Anesthesia Physical Anesthesia Plan  ASA: II  Anesthesia Plan: General   Post-op Pain Management:    Induction: Intravenous  Airway Management Planned: LMA  Additional Equipment:   Intra-op Plan:   Post-operative Plan: Extubation in OR  Informed Consent: I have reviewed the patients History and Physical, chart, labs and discussed the procedure including the risks, benefits and alternatives for the proposed anesthesia with the patient or authorized representative who has indicated his/her understanding and acceptance.   Dental advisory given  Plan Discussed with: CRNA  Anesthesia Plan Comments: (Patient prefers GA. Risks of general anesthesia discussed including, but not limited to, sore throat, hoarse voice, chipped/damaged teeth, injury to vocal cords, nausea and vomiting, allergic reactions, lung  infection, heart attack, stroke, and death. All questions answered. )       Anesthesia Quick Evaluation

## 2016-02-12 ENCOUNTER — Encounter (HOSPITAL_BASED_OUTPATIENT_CLINIC_OR_DEPARTMENT_OTHER)
Admission: RE | Disposition: A | Discharge status: Home / Self Care | Payer: Self-pay | Source: Ambulatory Visit | Attending: Orthopedic Surgery

## 2016-02-12 ENCOUNTER — Ambulatory Visit (HOSPITAL_BASED_OUTPATIENT_CLINIC_OR_DEPARTMENT_OTHER): Payer: BLUE CROSS/BLUE SHIELD | Admitting: Anesthesiology

## 2016-02-12 ENCOUNTER — Ambulatory Visit (HOSPITAL_BASED_OUTPATIENT_CLINIC_OR_DEPARTMENT_OTHER)
Admission: RE | Admit: 2016-02-12 | Discharge: 2016-02-12 | Disposition: A | Discharge status: Home / Self Care | Payer: BLUE CROSS/BLUE SHIELD | Source: Ambulatory Visit | Attending: Orthopedic Surgery | Admitting: Orthopedic Surgery

## 2016-02-12 ENCOUNTER — Encounter (HOSPITAL_BASED_OUTPATIENT_CLINIC_OR_DEPARTMENT_OTHER): Payer: Self-pay | Admitting: Anesthesiology

## 2016-02-12 DIAGNOSIS — I10 Essential (primary) hypertension: Secondary | ICD-10-CM | POA: Diagnosis not present

## 2016-02-12 DIAGNOSIS — K219 Gastro-esophageal reflux disease without esophagitis: Secondary | ICD-10-CM | POA: Diagnosis not present

## 2016-02-12 DIAGNOSIS — M1990 Unspecified osteoarthritis, unspecified site: Secondary | ICD-10-CM | POA: Insufficient documentation

## 2016-02-12 DIAGNOSIS — G5602 Carpal tunnel syndrome, left upper limb: Secondary | ICD-10-CM | POA: Insufficient documentation

## 2016-02-12 DIAGNOSIS — Z880 Allergy status to penicillin: Secondary | ICD-10-CM | POA: Diagnosis not present

## 2016-02-12 HISTORY — DX: Unspecified osteoarthritis, unspecified site: M19.90

## 2016-02-12 HISTORY — PX: CARPAL TUNNEL RELEASE: SHX101

## 2016-02-12 HISTORY — DX: Carpal tunnel syndrome, unspecified upper limb: G56.00

## 2016-02-12 SURGERY — CARPAL TUNNEL RELEASE
Anesthesia: General | Site: Wrist | Laterality: Left

## 2016-02-12 MED ORDER — FENTANYL CITRATE (PF) 100 MCG/2ML IJ SOLN: 50.0000 ug | INTRAMUSCULAR | Status: DC | PRN

## 2016-02-12 MED ORDER — VANCOMYCIN HCL IN DEXTROSE 1-5 GM/200ML-% IV SOLN
1000.0000 mg | INTRAVENOUS | Status: AC
  Administered 2016-02-12: 1000 mg via INTRAVENOUS

## 2016-02-12 MED ORDER — PROPOFOL 10 MG/ML IV BOLUS
INTRAVENOUS | Status: DC | PRN
Start: 2016-02-12 — End: 2016-02-12
  Administered 2016-02-12: 200 mg via INTRAVENOUS

## 2016-02-12 MED ORDER — ONDANSETRON HCL 4 MG/2ML IJ SOLN
INTRAMUSCULAR | Status: DC | PRN
  Administered 2016-02-12: 4 mg via INTRAVENOUS

## 2016-02-12 MED ORDER — LACTATED RINGERS IV SOLN
INTRAVENOUS | Status: DC
  Administered 2016-02-12 (×2): via INTRAVENOUS

## 2016-02-12 MED ORDER — HYDROCODONE-ACETAMINOPHEN 5-325 MG PO TABS
1.0000 | ORAL_TABLET | Freq: Four times a day (QID) | ORAL | 0 refills | Status: DC | PRN
Start: 2016-02-12 — End: 2016-05-25

## 2016-02-12 MED ORDER — LIDOCAINE HCL (CARDIAC) 20 MG/ML IV SOLN
INTRAVENOUS | Status: DC | PRN
Start: 2016-02-12 — End: 2016-02-12
  Administered 2016-02-12: 30 mg via INTRAVENOUS

## 2016-02-12 MED ORDER — FENTANYL CITRATE (PF) 100 MCG/2ML IJ SOLN
INTRAMUSCULAR | Status: DC | PRN
  Administered 2016-02-12: 100 ug via INTRAVENOUS

## 2016-02-12 MED ORDER — HYDROCODONE-ACETAMINOPHEN 5-325 MG PO TABS
1.0000 | ORAL_TABLET | Freq: Once | ORAL | Status: AC
  Administered 2016-02-12: 1 via ORAL

## 2016-02-12 MED ORDER — FENTANYL CITRATE (PF) 100 MCG/2ML IJ SOLN: 25.0000 ug | INTRAMUSCULAR | Status: DC | PRN

## 2016-02-12 MED ORDER — MIDAZOLAM HCL 2 MG/2ML IJ SOLN: 1.0000 mg | INTRAMUSCULAR | Status: DC | PRN

## 2016-02-12 MED ORDER — SCOPOLAMINE 1 MG/3DAYS TD PT72: 1.0000 | MEDICATED_PATCH | Freq: Once | TRANSDERMAL | Status: DC | PRN

## 2016-02-12 MED ORDER — GLYCOPYRROLATE 0.2 MG/ML IJ SOLN: 0.2000 mg | Freq: Once | INTRAMUSCULAR | Status: DC | PRN

## 2016-02-12 MED ORDER — HYDROCODONE-ACETAMINOPHEN 5-325 MG PO TABS
ORAL_TABLET | ORAL | Status: AC
  Filled 2016-02-12: qty 1

## 2016-02-12 MED ORDER — MIDAZOLAM HCL 5 MG/5ML IJ SOLN
INTRAMUSCULAR | Status: DC | PRN
  Administered 2016-02-12 (×2): 1 mg via INTRAVENOUS

## 2016-02-12 MED ORDER — PROMETHAZINE HCL 25 MG/ML IJ SOLN: 6.2500 mg | INTRAMUSCULAR | Status: DC | PRN

## 2016-02-12 MED ORDER — BUPIVACAINE HCL (PF) 0.25 % IJ SOLN
INTRAMUSCULAR | Status: DC | PRN
  Administered 2016-02-12: 8 mL

## 2016-02-12 MED ORDER — CHLORHEXIDINE GLUCONATE 4 % EX LIQD
60.0000 mL | Freq: Once | CUTANEOUS | Status: DC
Start: 2016-02-12 — End: 2016-02-12

## 2016-02-12 MED ORDER — LIDOCAINE HCL (PF) 0.5 % IJ SOLN
INTRAMUSCULAR | Status: AC
  Filled 2016-02-12: qty 150

## 2016-02-12 MED ORDER — VANCOMYCIN HCL IN DEXTROSE 1-5 GM/200ML-% IV SOLN
INTRAVENOUS | Status: AC
  Filled 2016-02-12: qty 200

## 2016-02-12 MED ORDER — PROPOFOL 500 MG/50ML IV EMUL
INTRAVENOUS | Status: AC
  Filled 2016-02-12: qty 50

## 2016-02-12 SURGICAL SUPPLY — 35 items
BLADE SURG 15 STRL LF DISP TIS (BLADE) ×1 IMPLANT
BLADE SURG 15 STRL SS (BLADE) ×2
BNDG CMPR 9X4 STRL LF SNTH (GAUZE/BANDAGES/DRESSINGS) ×1
BNDG COHESIVE 3X5 TAN STRL LF (GAUZE/BANDAGES/DRESSINGS) ×2 IMPLANT
BNDG COHESIVE 4X5 TAN STRL (GAUZE/BANDAGES/DRESSINGS) ×1 IMPLANT
BNDG ESMARK 4X9 LF (GAUZE/BANDAGES/DRESSINGS) ×1 IMPLANT
BNDG GAUZE ELAST 4 BULKY (GAUZE/BANDAGES/DRESSINGS) ×2 IMPLANT
CHLORAPREP W/TINT 26ML (MISCELLANEOUS) ×2 IMPLANT
CORDS BIPOLAR (ELECTRODE) ×2 IMPLANT
COVER BACK TABLE 60X90IN (DRAPES) ×2 IMPLANT
COVER MAYO STAND STRL (DRAPES) ×2 IMPLANT
CUFF TOURNIQUET SINGLE 18IN (TOURNIQUET CUFF) ×2 IMPLANT
DRAPE EXTREMITY T 121X128X90 (DRAPE) ×2 IMPLANT
DRAPE SURG 17X23 STRL (DRAPES) ×2 IMPLANT
DRSG PAD ABDOMINAL 8X10 ST (GAUZE/BANDAGES/DRESSINGS) ×2 IMPLANT
GAUZE SPONGE 4X4 12PLY STRL (GAUZE/BANDAGES/DRESSINGS) ×2 IMPLANT
GAUZE XEROFORM 1X8 LF (GAUZE/BANDAGES/DRESSINGS) ×2 IMPLANT
GLOVE BIO SURGEON STRL SZ 6.5 (GLOVE) ×1 IMPLANT
GLOVE BIO SURGEON STRL SZ7 (GLOVE) ×1 IMPLANT
GLOVE BIOGEL PI IND STRL 8.5 (GLOVE) ×1 IMPLANT
GLOVE BIOGEL PI INDICATOR 8.5 (GLOVE) ×1
GLOVE SURG ORTHO 8.0 STRL STRW (GLOVE) ×2 IMPLANT
GOWN STRL REUS W/ TWL LRG LVL3 (GOWN DISPOSABLE) ×1 IMPLANT
GOWN STRL REUS W/TWL LRG LVL3 (GOWN DISPOSABLE) ×2
GOWN STRL REUS W/TWL XL LVL3 (GOWN DISPOSABLE) ×2 IMPLANT
NEEDLE PRECISIONGLIDE 27X1.5 (NEEDLE) ×2 IMPLANT
NS IRRIG 1000ML POUR BTL (IV SOLUTION) ×2 IMPLANT
PACK BASIN DAY SURGERY FS (CUSTOM PROCEDURE TRAY) ×2 IMPLANT
STOCKINETTE 4X48 STRL (DRAPES) ×2 IMPLANT
SUT ETHILON 4 0 PS 2 18 (SUTURE) ×2 IMPLANT
SUT VICRYL 4-0 PS2 18IN ABS (SUTURE) IMPLANT
SYR BULB 3OZ (MISCELLANEOUS) ×2 IMPLANT
SYR CONTROL 10ML LL (SYRINGE) ×1 IMPLANT
TOWEL OR 17X24 6PK STRL BLUE (TOWEL DISPOSABLE) ×2 IMPLANT
UNDERPAD 30X30 (UNDERPADS AND DIAPERS) ×2 IMPLANT

## 2016-02-12 NOTE — Transfer of Care (Signed)
Immediate Anesthesia Transfer of Care Note  Patient: Diana [name]  Procedure(s) Performed: Procedure(s) with comments: LEFT CARPAL TUNNEL RELEASE (Left) - FAB  Patient Location: PACU  Anesthesia Type:General  Level of Consciousness: sedated  Airway & Oxygen Therapy: Patient Spontanous Breathing and Patient connected to face mask oxygen  Post-op Assessment: Report given to RN and Post -op Vital signs reviewed and stable  Post vital signs: Reviewed and stable  Last Vitals:  Vitals:   02/12/16 0718 02/12/16 0722  BP: (!) 166/104 (!) 171/108  Pulse: 96   Resp: 16   Temp: 36.9 C     Last Pain:  Vitals:   02/12/16 0718  TempSrc: Oral  PainSc: 7       Patients Stated Pain Goal: 4 (02/12/16 0718)  Complications: No apparent anesthesia complications

## 2016-02-12 NOTE — Op Note (Signed)
Dictation Number [id number]

## 2016-02-12 NOTE — Anesthesia Procedure Notes (Signed)
Procedure Name: LMA Insertion Date/Time: 02/12/2016 8:32 AM Performed by: Genevieve NorlanderLINKA, Laquita Harlan L Pre-anesthesia Checklist: Patient identified, Emergency Drugs available, Suction available, Patient being monitored and Timeout performed Patient Re-evaluated:Patient Re-evaluated prior to inductionOxygen Delivery Method: Circle system utilized Preoxygenation: Pre-oxygenation with 100% oxygen Intubation Type: IV induction Ventilation: Mask ventilation without difficulty LMA: LMA inserted LMA Size: 4.0 Number of attempts: 1 Airway Equipment and Method: Bite block Placement Confirmation: positive ETCO2 Tube secured with: Tape Dental Injury: Teeth and Oropharynx as per pre-operative assessment

## 2016-02-12 NOTE — Op Note (Signed)
NAMLewanda Rife:  Diana [name], Diana [name]                ACCOUNT NO.:  [id number]  MEDICAL RECORD NO.:  [id number]  LOCATION:                                 FACILITY:  PHYSICIAN:  Cindee SaltGary Lodema Parma, M.D.       DATE OF BIRTH:  11-20-1955  DATE OF PROCEDURE:  02/12/2016 DATE OF DISCHARGE:                              OPERATIVE REPORT   PREOPERATIVE DIAGNOSIS:  Carpal tunnel syndrome, left hand.  POSTOPERATIVE DIAGNOSIS:  Carpal tunnel syndrome, left hand.  OPERATION:  Decompression left median nerve.  SURGEON:  Cindee SaltGary Tallis Soledad, MD.  ANESTHESIA:  General with local infiltration.  [facility]  HISTORY:  The patient is a 60 year old female with a history of positive nerve conduction with numbness and tingling of her left hand.  Nerve conductions are positive revealing carpal tunnel syndrome.  She has elected to undergo surgical decompression to the median nerve.  Pre, peri, and postoperative course have been discussed along with risks and complications.  She is aware that there is a possibility of infection; recurrence of injury to arteries, nerves, tendons; incomplete relief of symptoms along with dystrophy.  In the preoperative area, the patient was seen, the extremity marked by both patient and surgeon, antibiotic given.  PROCEDURE IN DETAIL:  The patient was brought to the operating room, where a general anesthetic was carried out without difficulty.  She was prepped using ChloraPrep in a supine position with the left arm free. The limb was exsanguinated with an Esmarch bandage.  Tourniquet placed high and the arm was inflated to 250 mmHg.  A longitudinal incision was made in the left palm.  It was carried down through subcutaneous tissue with bleeders being electrocauterized with bipolar.  The palmar fascia was split.  Superficial palmar arch was identified.  The flexor tendon to the ring and little finger was identified.  Retractor was placed holding the median nerve and  flexor tendons radially, and retractors were placed holding the ulnar nerve ulnarly.  The flexor retinaculum was then incised with sharp dissection.  A right angle and Sewall retractor were placed between skin and forearm fascia.  The nerve was dissected free from the overlying fascia.  This was done with blunt dissection. Under direct visualization, the flexor retinaculum and distal forearm fascia were released for approximately 2 cm proximal to the wrist crease under direct vision.  The canal was explored.  The compression to the nerve was apparent.  The motor branch entered into muscle distally.  The wound was copiously irrigated with saline.  The skin was then closed with interrupted 4-0 nylon sutures.  Local infiltration with 0.25% bupivacaine without epinephrine was given, approximately 8 mL was used.  A sterile compressive dressing was applied with the fingers free.  On deflation of the tourniquet, all fingers immediately pinked.  She was taken to the recovery room for observation in satisfactory condition.  She will be discharged home to [facility] week on Nor[facility].          ______________________________ Cindee SaltGary Bellanie Matthew, M.D.     GK/MEDQ  D:  02/12/2016  T:  02/12/2016  Job:  [id number] 

## 2016-02-12 NOTE — Anesthesia Postprocedure Evaluation (Signed)
Anesthesia Post Note  Patient: Diana [name]  Procedure(s) Performed: Procedure(s) (LRB): LEFT CARPAL TUNNEL RELEASE (Left)  Patient location during evaluation: PACU Anesthesia Type: General Level of consciousness: awake and alert Pain management: pain level controlled Vital Signs Assessment: post-procedure vital signs reviewed and stable Respiratory status: spontaneous breathing, nonlabored ventilation and respiratory function stable Cardiovascular status: blood pressure returned to baseline and stable Postop Assessment: no signs of nausea or vomiting Anesthetic complications: no    Last Vitals:  Vitals:   02/12/16 0945 02/12/16 1027  BP: (!) 151/99 (!) 172/98  Pulse: 86 91  Resp: 20 18  Temp:  36.4 C    Last Pain:  Vitals:   02/12/16 1027  TempSrc:   PainSc: 3                  Linton RumpJennifer Dickerson Flint Hakeem

## 2016-02-12 NOTE — Discharge Instructions (Signed)
[facility] Center Instructions °Hand Surgery ° °Wound Care: °Keep your hand elevated above the level of your heart.  Do not allow it to dangle by your side.  Keep the dressing dry and do not remove it unless your doctor advises you to do so.  He will usually change it at the time of your post-op visit.  Moving your fingers is advised to stimulate circulation but will depend on the site of your surgery.  If you have a splint applied, your doctor will advise you regarding movement. ° °Activity: °Do not drive or operate machinery today.  Rest today and then you may return to your normal activity and work as indicated by your physician. ° °Diet:  °Drink liquids today or eat a light diet.  You may resume a regular diet tomorrow.   ° ° ° °Post Anesthesia Home Care Instructions ° °Activity: °Get plenty of rest for the remainder of the day. A responsible adult should stay with you for 24 hours following the procedure.  °For the next 24 hours, DO NOT: °-Drive a car °-Operate machinery °-Drink alcoholic beverages °-Take any medication unless instructed by your physician °-Make any legal decisions or sign important papers. ° °Meals: °Start with liquid foods such as gelatin or soup. Progress to regular foods as tolerated. Avoid greasy, spicy, heavy foods. If nausea and/or vomiting occur, drink only clear liquids until the nausea and/or vomiting subsides. Call your physician if vomiting continues. ° °Special Instructions/Symptoms: °Your throat may feel dry or sore from the anesthesia or the breathing tube placed in your throat during surgery. If this causes discomfort, gargle with warm salt water. The discomfort should disappear within 24 hours. ° °If you had a scopolamine patch placed behind your ear for the management of post- operative nausea and/or vomiting: ° °1. The medication in the patch is effective for 72 hours, after which it should be removed.  Wrap patch in a tissue and discard in the trash. Wash hands thoroughly with  soap and water. °2. You may remove the patch earlier than 72 hours if you experience unpleasant side effects which may include dry mouth, dizziness or visual disturbances. °3. Avoid touching the patch. Wash your hands with soap and water after contact with the patch. °  ° °General expectations: °Pain for two to three days. °Fingers may become slightly swollen. ° °Call your doctor if any of the following occur: °Severe pain not relieved by pain medication. °Elevated temperature. °Dressing soaked with blood. °Inability to move fingers. °White or bluish color to fingers. ° °
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
## 2016-02-12 NOTE — Brief Op Note (Signed)
02/12/2016  9:04 AM  PATIENT:  Diana [name]  60 y.o. female  PRE-OPERATIVE DIAGNOSIS:  LEFT CARPAL TUNNEL SYNDROME  POST-OPERATIVE DIAGNOSIS:  LEFT CARPAL TUNNEL SYNDROME  PROCEDURE:  Procedure(s) with comments: LEFT CARPAL TUNNEL RELEASE (Left) - FAB  SURGEON:  Surgeon(s) and Role:    * Cindee SaltGary Gianni Mihalik, MD - Primary  PHYSICIAN ASSISTANT:   ASSISTANTS: none   ANESTHESIA:   local and general  EBL:  Total I/O In: 1000 [I.V.:1000] Out: 1 [Blood:1]  BLOOD ADMINISTERED:none  DRAINS: none   LOCAL MEDICATIONS USED:  BUPIVICAINE   SPECIMEN:  non  DISPOSITION OF SPECIMEN:  N/A  COUNTS:  YES  TOURNIQUET:   Total Tourniquet Time Documented: Upper Arm (Left) - 12 minutes Total: Upper Arm (Left) - 12 minutes   DICTATION: .Other Dictation: Dictation Number [id number]  PLAN OF CARE: Discharge to home after PACU  PATIENT DISPOSITION:  PACU - hemodynamically stable.

## 2016-02-15 ENCOUNTER — Encounter (HOSPITAL_BASED_OUTPATIENT_CLINIC_OR_DEPARTMENT_OTHER): Payer: Self-pay | Admitting: Orthopedic Surgery

## 2016-02-17 NOTE — Telephone Encounter (Signed)
Attempted to call pt about lab results. No answer. Left message to return call.

## 2016-02-19 NOTE — Telephone Encounter (Signed)
Spoke with pt about lab results.

## 2016-03-10 ENCOUNTER — Telehealth: Payer: Self-pay

## 2016-03-10 NOTE — Telephone Encounter (Signed)
PATIENT STATES SHE SAW DR. SHAW THE END OF AUGUST FOR A COMPLETE PHYSICAL. DR. SHAW KNOWS Clelia CroftHAT SHE HAD TO HAVE SURGERY ON HER (L) WRIST FOR CARPEL TUNNEL IN September. SHE WOULD LIKE DR. SHAW TO PRESCRIBE HER SOMETHING TO HELP HER SLEEP AT NIGHT. BEST PHONE [phone] (CELL)  PHARMACY CHOICE IS [facility] IN [place] AND [place].  MBC

## 2016-03-12 MED ORDER — TRAZODONE HCL 50 MG PO TABS: 25.0000 mg | ORAL_TABLET | Freq: Every evening | ORAL | 1 refills | Status: DC | PRN

## 2016-03-12 NOTE — Telephone Encounter (Signed)
Trazodone sent to pharmacy

## 2016-05-25 ENCOUNTER — Encounter: Payer: Self-pay | Admitting: Physician Assistant

## 2016-05-25 ENCOUNTER — Ambulatory Visit (INDEPENDENT_AMBULATORY_CARE_PROVIDER_SITE_OTHER): Payer: BLUE CROSS/BLUE SHIELD | Admitting: Physician Assistant

## 2016-05-25 VITALS — BP 141/85 | HR 107 | Temp 98.9°F | Resp 16 | Ht 62.5 in | Wt 132.6 lb

## 2016-05-25 DIAGNOSIS — R05 Cough: Secondary | ICD-10-CM

## 2016-05-25 DIAGNOSIS — R059 Cough, unspecified: Secondary | ICD-10-CM

## 2016-05-25 MED ORDER — HYDROCOD POLST-CPM POLST ER 10-8 MG/5ML PO SUER: 5.0000 mL | Freq: Every evening | ORAL | 0 refills | Status: DC | PRN

## 2016-05-25 MED ORDER — BENZONATATE 100 MG PO CAPS: 100.0000 mg | ORAL_CAPSULE | Freq: Three times a day (TID) | ORAL | 0 refills | Status: DC | PRN

## 2016-05-25 MED ORDER — DOXYCYCLINE HYCLATE 100 MG PO CAPS: 100.0000 mg | ORAL_CAPSULE | Freq: Two times a day (BID) | ORAL | 0 refills | Status: DC

## 2016-05-25 NOTE — Progress Notes (Signed)
Patient ID: Diana [name], female    DOB: 06/12/1955, 60 y.o.   MRN: [id number]  PCP: Norberto SorensonSHAW,EVA, MD  Chief Complaint  Patient presents with  . Cough    chest hurts, otc meds aren't helpin, hoarsness, for 3 weeks    Subjective:   Presents for evaluation of cough x 3 weeks.  Initially had a fever, but that has resolved. Has had a lot of nasal/sinus congestion. Cough is worse at night. Non-productive. Feels like the phlegm is stuck in the throat. OTC products, 2 contain pseudofed. No nausea/vomiting.  Review of Systems As above.    Patient Active Problem List   Diagnosis Date Noted  . Carpal tunnel syndrome 12/18/2015  . Unilateral osteoarthritis of first carpometacarpal (CMC) joint 12/18/2015  . Cervicalgia 12/18/2015  . Benign essential HTN 04/08/2015  . Metatarsalgia of right foot 02/11/2015  . Degeneration of intervertebral disc of lumbar region 12/18/2014  . Acid reflux 12/18/2014     Prior to Admission medications   Medication Sig Start Date End Date Taking? Authorizing Provider  acetaminophen (TYLENOL) 500 MG tablet Take 500 mg by mouth every 6 (six) hours as needed.   Yes Historical Provider, MD  cetirizine (ZYRTEC) 10 MG tablet Take 1 tablet (10 mg total) by mouth at bedtime. 01/29/16  Yes Sherren MochaEva N Shaw, MD  cyclobenzaprine (FLEXERIL) 10 MG tablet Take 1 tablet (10 mg total) by mouth 3 (three) times daily as needed for muscle spasms. 01/29/16  Yes Sherren MochaEva N Shaw, MD  fluticasone (FLONASE) 50 MCG/ACT nasal spray Place 2 sprays into both nostrils at bedtime. 01/29/16  Yes Sherren MochaEva N Shaw, MD  HYDROcodone-acetaminophen (NORCO) 5-325 MG tablet Take 1 tablet by mouth every 6 (six) hours as needed for moderate pain. 02/12/16  Yes Cindee SaltGary Kuzma, MD  meloxicam (MOBIC) 15 MG tablet Take 1 tablet (15 mg total) by mouth daily. 01/29/16  Yes Sherren MochaEva N Shaw, MD  traZODone (DESYREL) 50 MG tablet Take 0.5-1 tablets (25-50 mg total) by mouth at bedtime as needed for sleep. 03/12/16  Yes Sherren MochaEva N Shaw, MD       Allergies  Allergen Reactions  . Penicillins Rash  . Codeine Anxiety       Objective:  Physical Exam  Constitutional: She is oriented to person, place, and time. She appears well-developed and well-nourished. She is active and cooperative. No distress.  BP (!) 141/85 (BP Location: Right Arm, Patient Position: Sitting, Cuff Size: Small)   Pulse (!) 107   Temp 98.9 F (37.2 C) (Oral)   Resp 16   Ht 5' 2.5" (1.588 m)   Wt 132 lb 9.6 oz (60.1 kg)   SpO2 100%   BMI 23.87 kg/m   HENT:  Head: Normocephalic and atraumatic.  Right Ear: Hearing, tympanic membrane, external ear and ear canal normal.  Left Ear: Hearing, tympanic membrane, external ear and ear canal normal.  Nose: Nose normal.  Mouth/Throat: Uvula is midline, oropharynx is clear and moist and mucous membranes are normal.  Eyes: Conjunctivae are normal. No scleral icterus.  Neck: Normal range of motion. Neck supple. No thyromegaly present.  Cardiovascular: Normal rate, regular rhythm and normal heart sounds.   Pulses:      Radial pulses are 2+ on the right side, and 2+ on the left side.  Pulmonary/Chest: Effort normal and breath sounds normal. She has no decreased breath sounds. She has no wheezes. She has no rhonchi. She has no rales.  Lymphadenopathy:       Head (right side):  No tonsillar, no preauricular, no posterior auricular and no occipital adenopathy present.       Head (left side): No tonsillar, no preauricular, no posterior auricular and no occipital adenopathy present.    She has no cervical adenopathy.       Right: No supraclavicular adenopathy present.       Left: No supraclavicular adenopathy present.  Neurological: She is alert and oriented to person, place, and time. No sensory deficit.  Skin: Skin is warm, dry and intact. No rash noted. No cyanosis or erythema. Nails show no clubbing.  Psychiatric: She has a normal mood and affect. Her speech is normal and behavior is normal.            Assessment & Plan:   1. Cough Likely post-viral cough, but given the duration of her symptoms, elect to cover for atypicals. If no improvement, plan CXR. - benzonatate (TESSALON) 100 MG capsule; Take 1-2 capsules (100-200 mg total) by mouth 3 (three) times daily as needed for cough.  Dispense: 40 capsule; Refill: 0 - doxycycline (VIBRAMYCIN) 100 MG capsule; Take 1 capsule (100 mg total) by mouth 2 (two) times daily.  Dispense: 20 capsule; Refill: 0 - chlorpheniramine-HYDROcodone (TUSSIONEX PENNKINETIC ER) 10-8 MG/5ML SUER; Take 5 mLs by mouth at bedtime as needed for cough.  Dispense: 60 mL; Refill: 0   Fernande Brashelle S. Bolivar Koranda, PA-C Physician Assistant-Certified [facility]

## 2016-05-25 NOTE — Patient Instructions (Addendum)
Read the labels of the medicine that you have. Eliminate anything that contains a decongestant (pseudofed). Drink lots of water. Get as much rest as you can.    IF you received an x-ray today, you will receive an invoice from [facility]. Please contact [facility] at [phone] with questions or concerns regarding your invoice.   IF you received labwork today, you will receive an invoice from [facility]. Please contact [facility] at [phone] with questions or concerns regarding your invoice.   Our billing staff will not be able to assist you with questions regarding bills from these companies.  You will be contacted with the lab results as soon as they are available. The fastest way to get your results is to activate your My Chart account. Instructions are located on the last page of this paperwork. If you have not heard from us regarding the results in 2 weeks, please contact this office.

## 2016-06-30 ENCOUNTER — Telehealth: Payer: Self-pay

## 2016-06-30 DIAGNOSIS — R059 Cough, unspecified: Secondary | ICD-10-CM

## 2016-06-30 DIAGNOSIS — R05 Cough: Secondary | ICD-10-CM

## 2016-06-30 MED ORDER — BENZONATATE 100 MG PO CAPS: 100.0000 mg | ORAL_CAPSULE | Freq: Three times a day (TID) | ORAL | 0 refills | Status: DC | PRN

## 2016-06-30 NOTE — Telephone Encounter (Signed)
Last seen 05/25/16

## 2016-06-30 NOTE — Telephone Encounter (Signed)
Chelle - Pt wants a refill on her tessalon pearls and tussionex. [phone]

## 2016-06-30 NOTE — Telephone Encounter (Signed)
Lm with chelle response

## 2016-06-30 NOTE — Telephone Encounter (Signed)
Meds ordered this encounter  Medications  . benzonatate (TESSALON) 100 MG capsule    Sig: Take 1-2 capsules (100-200 mg total) by mouth 3 (three) times daily as needed for cough.    Dispense:  40 capsule    Refill:  0    If she needs a refill of the Tussionex, she'll need re-evaluation.

## 2016-07-01 ENCOUNTER — Ambulatory Visit: Payer: BLUE CROSS/BLUE SHIELD | Admitting: Family Medicine

## 2016-07-03 ENCOUNTER — Ambulatory Visit (INDEPENDENT_AMBULATORY_CARE_PROVIDER_SITE_OTHER): Payer: BLUE CROSS/BLUE SHIELD | Admitting: Family Medicine

## 2016-07-03 VITALS — BP 140/80 | HR 94 | Temp 98.6°F | Resp 20 | Ht 62.25 in | Wt 131.5 lb

## 2016-07-03 DIAGNOSIS — J209 Acute bronchitis, unspecified: Secondary | ICD-10-CM

## 2016-07-03 DIAGNOSIS — R05 Cough: Secondary | ICD-10-CM | POA: Diagnosis not present

## 2016-07-03 DIAGNOSIS — Z20828 Contact with and (suspected) exposure to other viral communicable diseases: Secondary | ICD-10-CM

## 2016-07-03 DIAGNOSIS — M62838 Other muscle spasm: Secondary | ICD-10-CM | POA: Diagnosis not present

## 2016-07-03 DIAGNOSIS — R059 Cough, unspecified: Secondary | ICD-10-CM

## 2016-07-03 MED ORDER — PROMETHAZINE-DM 6.25-15 MG/5ML PO SYRP: 5.0000 mL | ORAL_SOLUTION | Freq: Four times a day (QID) | ORAL | 0 refills | Status: DC | PRN

## 2016-07-03 MED ORDER — GUAIFENESIN ER 1200 MG PO TB12: 1.0000 | ORAL_TABLET | Freq: Two times a day (BID) | ORAL | 1 refills | Status: DC | PRN

## 2016-07-03 MED ORDER — MUCINEX DM MAXIMUM STRENGTH 60-1200 MG PO TB12: 1.0000 | ORAL_TABLET | Freq: Two times a day (BID) | ORAL | 1 refills | Status: DC

## 2016-07-03 MED ORDER — OSELTAMIVIR PHOSPHATE 75 MG PO CAPS: 75.0000 mg | ORAL_CAPSULE | Freq: Two times a day (BID) | ORAL | 0 refills | Status: DC

## 2016-07-03 MED ORDER — BENZONATATE 200 MG PO CAPS: 200.0000 mg | ORAL_CAPSULE | Freq: Three times a day (TID) | ORAL | 0 refills | Status: DC | PRN

## 2016-07-03 MED ORDER — CYCLOBENZAPRINE HCL 10 MG PO TABS: 10.0000 mg | ORAL_TABLET | Freq: Three times a day (TID) | ORAL | 2 refills | Status: DC | PRN

## 2016-07-03 MED ORDER — ALBUTEROL SULFATE (2.5 MG/3ML) 0.083% IN NEBU
2.5000 mg | INHALATION_SOLUTION | Freq: Once | RESPIRATORY_TRACT | Status: AC
  Administered 2016-07-03: 2.5 mg via RESPIRATORY_TRACT

## 2016-07-03 NOTE — Patient Instructions (Addendum)
   IF you received an x-ray today, you will receive an invoice from [facility]. Please contact [facility] at [phone] with questions or concerns regarding your invoice.   IF you received labwork today, you will receive an invoice from [facility]. Please contact [facility] at [phone] with questions or concerns regarding your invoice.   Our billing staff will not be able to assist you with questions regarding bills from these companies.  You will be contacted with the lab results as soon as they are available. The fastest way to get your results is to activate your My Chart account. Instructions are located on the last page of this paperwork. If you have not heard from us regarding the results in 2 weeks, please contact this office.    We recommend that you schedule a mammogram for breast cancer screening. Typically, you do not need a referral to do this. Please contact a local imaging center to schedule your mammogram.  [facility] - [phone]  *ask for the Radiology Department [facility]) - [phone] or [phone]  [facility] - [phone] [facility] - [phone] [facility] - [phone]  *ask for the Radiology Department [facility] - [phone]  *ask for the Radiology Department [facility] - [phone]  *ask for the Mammography Department [facility] - [phone] Acute Bronchitis, Adult Acute bronchitis is sudden (acute) swelling of the air tubes (bronchi) in the lungs. Acute bronchitis causes these tubes to fill with mucus, which can make it hard to breathe. It can also cause coughing or wheezing. In adults, acute bronchitis usually goes away within 2 weeks. A cough caused by bronchitis may last up to 3 weeks. Smoking, allergies, and asthma can make the condition worse. Repeated episodes of bronchitis may cause further lung  problems, such as chronic obstructive pulmonary disease (COPD). What are the causes? This condition can be caused by germs and by substances that irritate the lungs, including:  Cold and flu viruses. This condition is most often caused by the same virus that causes a cold.  Bacteria.  Exposure to tobacco smoke, dust, fumes, and air pollution.  What increases the risk? This condition is more likely to develop in people who:  Have close contact with someone with acute bronchitis.  Are exposed to lung irritants, such as tobacco smoke, dust, fumes, and vapors.  Have a weak immune system.  Have a respiratory condition such as asthma.  What are the signs or symptoms? Symptoms of this condition include:  A cough.  Coughing up clear, yellow, or green mucus.  Wheezing.  Chest congestion.  Shortness of breath.  A fever.  Body aches.  Chills.  A sore throat.  How is this diagnosed? This condition is usually diagnosed with a physical exam. During the exam, your health care provider may order tests, such as chest X-rays, to rule out other conditions. He or she may also:  Test a sample of your mucus for bacterial infection.  Check the level of oxygen in your blood. This is done to check for pneumonia.  Do a chest X-ray or lung function testing to rule out pneumonia and other conditions.  Perform blood tests.  Your health care provider will also ask about your symptoms and medical history. How is this treated? Most cases of acute bronchitis clear up over time without treatment. Your health care provider may recommend:  Drinking more fluids. Drinking more   makes your mucus thinner, which may make it easier to breathe.  Taking a medicine for a fever or cough.  Taking an antibiotic medicine.  Using an inhaler to help improve shortness of breath and to control a cough.  Using a cool mist vaporizer or humidifier to make it easier to breathe.  Follow these instructions at  home: Medicines  Take over-the-counter and prescription medicines only as told by your health care provider.  If you were prescribed an antibiotic, take it as told by your health care provider. Do not stop taking the antibiotic even if you start to feel better. General instructions  Get plenty of rest.  Drink enough fluids to keep your urine clear or pale yellow.  Avoid smoking and secondhand smoke. Exposure to cigarette smoke or irritating chemicals will make bronchitis worse. If you smoke and you need help quitting, ask your health care provider. Quitting smoking will help your lungs heal faster.  Use an inhaler, cool mist vaporizer, or humidifier as told by your health care provider.  Keep all follow-up visits as told by your health care provider. This is important. How is this prevented? To lower your risk of getting this condition again:  Wash your hands often with soap and water. If soap and water are not available, use hand sanitizer.  Avoid contact with people who have cold symptoms.  Try not to touch your hands to your mouth, nose, or eyes.  Make sure to get the flu shot every year.  Contact a health care provider if:  Your symptoms do not improve in 2 weeks of treatment. Get help right away if:  You cough up blood.  You have chest pain.  You have severe shortness of breath.  You become dehydrated.  You faint or keep feeling like you are going to faint.  You keep vomiting.  You have a severe headache.  Your fever or chills gets worse. This information is not intended to replace advice given to you by your health care provider. Make sure you discuss any questions you have with your health care provider. Document Released: 06/24/2004 Document Revised: 12/10/2015 Document Reviewed: 11/05/2015 Elsevier Interactive Patient Education  2017 [employer]  

## 2016-07-03 NOTE — Progress Notes (Signed)
By signing my name below, I, Mesha Guinyard, attest that this documentation has been prepared under the direction and in the presence of Norberto Sorenson, MD.  Electronically Signed: Arvilla Market, Medical Scribe. 07/03/16. 12:40 PM.  Subjective:    Patient ID: Diana [name], female    DOB: 09-Dec-1955, 61 y.o.   MRN: [id number]  HPI Chief Complaint  Patient presents with  . Nasal Congestion  . Cough  . Shortness of Breath  . Neck/Shoulder Pain  . Medication Refill    Flexeril & Tussionex    HPI Comments: Diana [name] is a 61 y.o. female who presents to the [facility] complaining of feeling ill. Pt was seen less than 6 weeks prior for viral bronchitis. Treated with doxycycline due to the duration of sxs, as well as tessalon and tussionex.  Reports associated sxs of sneezing, SOB, congestion, cough with occasional chest pain described as a burning sensation, chest tightness, neck/shoulder myalgia, sleep disturbance. Pt felt a little better after being seen 6 weeks prior but she continues to have intermittent voice loss - it will get better but will return. 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 Pt felt nauseous so she went to the fast track and was given sudafed and robitussin, which raised her bp triggering a HA. Pt's bp at home has been fine. Pt had to take her mom, who had the flu, to the urgent care since her mom can't move around alone. Pt's mom started to feel ill 3 days ago. Pt mentions she normally uses flexeril and meloxicam for her neck and shoulder pain. Pt has never had to use an inhaler. Denies diarrhea, and emesis.  Patient Active Problem List   Diagnosis Date Noted  . Carpal tunnel syndrome 12/18/2015  . Unilateral osteoarthritis of first carpometacarpal (CMC) joint 12/18/2015  . Cervicalgia 12/18/2015  . Benign essential HTN 04/08/2015  . Metatarsalgia of right foot 02/11/2015  . Degeneration of  intervertebral disc of lumbar region 12/18/2014  . Acid reflux 12/18/2014   Past Medical History:  Diagnosis Date  . Allergy   . Arthritis    feet  . Carpal tunnel syndrome    Past Surgical History:  Procedure Laterality Date  . CARPAL TUNNEL RELEASE Left 02/12/2016   Procedure: LEFT CARPAL TUNNEL RELEASE;  Surgeon: Cindee Salt, MD;  Location: [facility];  Service: Orthopedics;  Laterality: Left;  FAB  . TUBAL LIGATION    . WRIST SURGERY Right    Allergies  Allergen Reactions  . Penicillins Rash  . Codeine Anxiety   Prior to Admission medications   Medication Sig Start Date End Date Taking? Authorizing Provider  acetaminophen (TYLENOL) 500 MG tablet Take 500 mg by mouth every 6 (six) hours as needed.    Historical Provider, MD  benzonatate (TESSALON) 100 MG capsule Take 1-2 capsules (100-200 mg total) by mouth 3 (three) times daily as needed for cough. 06/30/16   Chelle Jeffery, PA-C  cetirizine (ZYRTEC) 10 MG tablet Take 1 tablet (10 mg total) by mouth at bedtime. 01/29/16   Sherren Mocha, MD  chlorpheniramine-HYDROcodone (TUSSIONEX PENNKINETIC ER) 10-8 MG/5ML SUER Take 5 mLs by mouth at bedtime as needed for cough. 05/25/16   Chelle Jeffery, PA-C  cyclobenzaprine (FLEXERIL) 10 MG tablet Take 1 tablet (10 mg total) by mouth 3 (three) times daily as needed for muscle spasms. 01/29/16   Sherren Mocha, MD  doxycycline (VIBRAMYCIN) 100 MG  capsule Take 1 capsule (100 mg total) by mouth 2 (two) times daily. 05/25/16   Chelle Jeffery, PA-C  fluticasone (FLONASE) 50 MCG/ACT nasal spray Place 2 sprays into both nostrils at bedtime. 01/29/16   Sherren MochaEva N Shaw, MD  meloxicam (MOBIC) 15 MG tablet Take 1 tablet (15 mg total) by mouth daily. 01/29/16   Sherren MochaEva N Shaw, MD  traZODone (DESYREL) 50 MG tablet Take 0.5-1 tablets (25-50 mg total) by mouth at bedtime as needed for sleep. 03/12/16   Sherren MochaEva N Shaw, MD   Social History   Social History  . Marital status: Divorced    Spouse name: Alcario Droughtrica  . Number  of children: 2  . Years of education: 12th grade   Occupational History  . floor tech    Social History Main Topics  . Smoking status: Never Smoker  . Smokeless tobacco: Never Used  . Alcohol use No  . Drug use: No  . Sexual activity: Not on file   Other Topics Concern  . Not on file   Social History Narrative   Divorced from her husband. She has two adult sons from that union . One lives in [place], the other lives in [place] (as a Solicitorcivilian) following Office managermilitary duty.   Married Alcario DroughtErica 05/2014.   Review of Systems  HENT: Positive for congestion, sneezing and voice change.   Respiratory: Positive for cough, chest tightness and shortness of breath.   Cardiovascular: Positive for chest pain (with cough).  Gastrointestinal: Positive for nausea. Negative for diarrhea and vomiting.  Musculoskeletal: Positive for myalgias and neck pain.  Psychiatric/Behavioral: Positive for sleep disturbance.   Objective:  Physical Exam  Constitutional: She appears well-developed and well-nourished. No distress.  HENT:  Head: Normocephalic and atraumatic.  Nose: Nose normal.  Mouth/Throat: Oropharynx is clear and moist. No oropharyngeal exudate or posterior oropharyngeal erythema.  Eyes: Conjunctivae are normal.  Neck: Neck supple.  Cardiovascular: Regular rhythm, S1 normal, S2 normal and normal heart sounds.  Tachycardia present.  Exam reveals no gallop and no friction rub.   No murmur heard. Pulmonary/Chest: Effort normal and breath sounds normal. No respiratory distress. She has no wheezes. She has no rales.  Neurological: She is alert.  Skin: Skin is warm and dry.  Psychiatric: She has a normal mood and affect. Her behavior is normal.  Nursing note and vitals reviewed.  BP 140/80   Pulse 94   Temp 98.6 F (37 C) (Oral)   Resp 20   Ht 5' 2.25" (1.581 m)   Wt 131 lb 8 oz (59.6 kg)   SpO2 98%   BMI 23.86 kg/m  Assessment & Plan:   1. Acute bronchitis, unspecified organism   2. Muscle spasm     3. Cough   4. Exposure to influenza     No orders of the defined types were placed in this encounter.   Meds ordered this encounter  Medications  . oseltamivir (TAMIFLU) 75 MG capsule    Sig: Take 1 capsule (75 mg total) by mouth 2 (two) times daily.    Dispense:  10 capsule    Refill:  0  . cyclobenzaprine (FLEXERIL) 10 MG tablet    Sig: Take 1 tablet (10 mg total) by mouth 3 (three) times daily as needed for muscle spasms.    Dispense:  60 tablet    Refill:  2  . benzonatate (TESSALON) 200 MG capsule    Sig: Take 1 capsule (200 mg total) by mouth 3 (three) times daily as needed  for cough.    Dispense:  60 capsule    Refill:  0  . DISCONTD: Dextromethorphan-Guaifenesin (MUCINEX DM MAXIMUM STRENGTH) 60-1200 MG TB12    Sig: Take 1 tablet by mouth every 12 (twelve) hours.    Dispense:  14 each    Refill:  1  . albuterol (PROVENTIL) (2.5 MG/3ML) 0.083% nebulizer solution 2.5 mg  . promethazine-dextromethorphan (PROMETHAZINE-DM) 6.25-15 MG/5ML syrup    Sig: Take 5 mLs by mouth 4 (four) times daily as needed for cough.    Dispense:  180 mL    Refill:  0  . Guaifenesin (MUCINEX MAXIMUM STRENGTH) 1200 MG TB12    Sig: Take 1 tablet (1,200 mg total) by mouth every 12 (twelve) hours as needed.    Dispense:  14 tablet    Refill:  1    D/c prior rx for mucinex dm    I personally performed the services described in this documentation, which was scribed in my presence. The recorded information has been reviewed and considered, and addended by me as needed.   Norberto Sorenson, M.D.  [facility] [address] [place], [place] [address] [phone] phone [phone] fax  07/06/16 2:29 PM

## 2016-07-06 ENCOUNTER — Telehealth: Payer: Self-pay | Admitting: Family Medicine

## 2016-07-06 MED ORDER — ALBUTEROL SULFATE HFA 108 (90 BASE) MCG/ACT IN AERS: 2.0000 | INHALATION_SPRAY | RESPIRATORY_TRACT | 1 refills | Status: DC | PRN

## 2016-07-06 NOTE — Telephone Encounter (Signed)
I donot see on pt list or in ov note?

## 2016-07-06 NOTE — Telephone Encounter (Signed)
Pt calling stating that Dr Clelia CroftShaw suppose to be sending over an inhaler for her to her pharmacy [facility] please respond

## 2016-07-06 NOTE — Telephone Encounter (Signed)
Sent in albuterol inh. Thanks.

## 2016-08-14 ENCOUNTER — Ambulatory Visit (INDEPENDENT_AMBULATORY_CARE_PROVIDER_SITE_OTHER): Payer: BLUE CROSS/BLUE SHIELD | Admitting: Family Medicine

## 2016-08-14 VITALS — BP 130/86 | HR 100 | Temp 98.2°F | Resp 18 | Ht 63.0 in | Wt 133.0 lb

## 2016-08-14 DIAGNOSIS — H6121 Impacted cerumen, right ear: Secondary | ICD-10-CM

## 2016-08-14 DIAGNOSIS — J0191 Acute recurrent sinusitis, unspecified: Secondary | ICD-10-CM

## 2016-08-14 MED ORDER — CEFDINIR 300 MG PO CAPS: 300.0000 mg | ORAL_CAPSULE | Freq: Two times a day (BID) | ORAL | 0 refills | Status: DC

## 2016-08-14 MED ORDER — PREDNISONE 20 MG PO TABS: ORAL_TABLET | ORAL | 0 refills | Status: DC

## 2016-08-14 NOTE — Patient Instructions (Addendum)
We recommend that you schedule a mammogram for breast cancer screening. Typically, you do not need a referral to do this. Please contact a local imaging center to schedule your mammogram.  [facility] - [phone]  *ask for the Radiology Department [facility]) - [phone] or [phone]) [phone]  [facility] - [phone] [facility] - [phone] [facility] - [phone]  *ask for the Radiology Department [facility] - [phone]  *ask for the Radiology Department [facility] - [phone]  *ask for the Mammography Department [facility] - [phone]) [phone]  Hot showers or breathing in steam may help loosen the congestion.  Using a netti pot or sinus rinse is also likely to help you feel better and keep this from progressing.  Use the nasal saline spray as needed throughout the day at leat 4-6 times for at least 2 weeks.  I recommend augmenting with 12 hr sudafed (behind the counter) or generic mucinex to help you move out the congestion.  If no improvement or you are getting worse, come back!    IF you received an x-ray today, you will receive an invoice from [facility]. Please contact [facility] at [phone] with questions or concerns regarding your invoice.   IF you received labwork today, you will receive an invoice from [facility]. Please contact [facility] at [phone] with questions or concerns regarding your invoice.   Our billing staff will not be able to assist you with questions regarding bills from these companies.  You will be contacted with the lab results as soon as they are available. The fastest way to get your results is to activate your My Chart account. Instructions are located on the last page of this paperwork. If you have not heard from us regarding the results in 2 weeks, please contact this office.     Sinusitis, Adult Sinusitis is  soreness and inflammation of your sinuses. Sinuses are hollow spaces in the bones around your face. Your sinuses are located:  Around your eyes.  In the middle of your forehead.  Behind your nose.  In your cheekbones. Your sinuses and nasal passages are lined with a stringy fluid (mucus). Mucus normally drains out of your sinuses. When your nasal tissues become inflamed or swollen, the mucus can become trapped or blocked so air cannot flow through your sinuses. This allows bacteria, viruses, and funguses to grow, which leads to infection. Sinusitis can develop quickly and last for 7?10 days (acute) or for more than 12 weeks (chronic). Sinusitis often develops after a cold. What are the causes? This condition is caused by anything that creates swelling in the sinuses or stops mucus from draining, including:  Allergies.  Asthma.  Bacterial or viral infection.  Abnormally shaped bones between the nasal passages.  Nasal growths that contain mucus (nasal polyps).  Narrow sinus openings.  Pollutants, such as chemicals or irritants in the air.  A foreign object stuck in the nose.  A fungal infection. This is rare. What increases the risk? The following factors may make you more likely to develop this condition:  Having allergies or asthma.  Having had a recent cold or respiratory tract infection.  Having structural deformities or blockages in your nose or sinuses.  Having a weak immune system.  Doing a lot of swimming or diving.  Overusing nasal sprays.  Smoking. What are the signs or symptoms? The main symptoms of this condition are pain  and a feeling of pressure around the affected sinuses. Other symptoms include:  Upper toothache.  Earache.  Headache.  Bad breath.  Decreased sense of smell and taste.  A cough that may get worse at night.  Fatigue.  Fever.  Thick drainage from your nose. The drainage is often green and it may contain pus  (purulent).  Stuffy nose or congestion.  Postnasal drip. This is when extra mucus collects in the throat or back of the nose.  Swelling and warmth over the affected sinuses.  Sore throat.  Sensitivity to light. How is this diagnosed? This condition is diagnosed based on symptoms, a medical history, and a physical exam. To find out if your condition is acute or chronic, your health care provider may:  Look in your nose for signs of nasal polyps.  Tap over the affected sinus to check for signs of infection.  View the inside of your sinuses using an imaging device that has a light attached (endoscope). If your health care provider suspects that you have chronic sinusitis, you may also:  Be tested for allergies.  Have a sample of mucus taken from your nose (nasal culture) and checked for bacteria.  Have a mucus sample examined to see if your sinusitis is related to an allergy. If your sinusitis does not respond to treatment and it lasts longer than 8 weeks, you may have an MRI or CT scan to check your sinuses. These scans also help to determine how severe your infection is. In rare cases, a bone biopsy may be done to rule out more serious types of fungal sinus disease. How is this treated? Treatment for sinusitis depends on the cause and whether your condition is chronic or acute. If a virus is causing your sinusitis, your symptoms will go away on their own within 10 days. You may be given medicines to relieve your symptoms, including:  Topical nasal decongestants. They shrink swollen nasal passages and let mucus drain from your sinuses.  Antihistamines. These drugs block inflammation that is triggered by allergies. This can help to ease swelling in your nose and sinuses.  Topical nasal corticosteroids. These are nasal sprays that ease inflammation and swelling in your nose and sinuses.  Nasal saline washes. These rinses can help to get rid of thick mucus in your nose. If your  condition is caused by bacteria, you will be given an antibiotic medicine. If your condition is caused by a fungus, you will be given an antifungal medicine. Surgery may be needed to correct underlying conditions, such as narrow nasal passages. Surgery may also be needed to remove polyps. Follow these instructions at home: Medicines   Take, use, or apply over-the-counter and prescription medicines only as told by your health care provider. These may include nasal sprays.  If you were prescribed an antibiotic medicine, take it as told by your health care provider. Do not stop taking the antibiotic even if you start to feel better. Hydrate and Humidify   Drink enough water to keep your urine clear or pale yellow. Staying hydrated will help to thin your mucus.  Use a cool mist humidifier to keep the humidity level in your home above 50%.  Inhale steam for 10-15 minutes, 3-4 times a day or as told by your health care provider. You can do this in the bathroom while a hot shower is running.  Limit your exposure to cool or dry air. Rest   Rest as much as possible.  Sleep with your head  raised (elevated).  Make sure to get enough sleep each night. General instructions   Apply a warm, moist washcloth to your face 3-4 times a day or as told by your health care provider. This will help with discomfort.  Wash your hands often with soap and water to reduce your exposure to viruses and other germs. If soap and water are not available, use hand sanitizer.  Do not smoke. Avoid being around people who are smoking (secondhand smoke).  Keep all follow-up visits as told by your health care provider. This is important. Contact a health care provider if:  You have a fever.  Your symptoms get worse.  Your symptoms do not improve within 10 days. Get help right away if:  You have a severe headache.  You have persistent vomiting.  You have pain or swelling around your face or eyes.  You have  vision problems.  You develop confusion.  Your neck is stiff.  You have trouble breathing. This information is not intended to replace advice given to you by your health care provider. Make sure you discuss any questions you have with your health care provider. Document Released: 05/17/2005 Document Revised: 01/11/2016 Document Reviewed: 03/12/2015 Elsevier Interactive Patient Education  2017 [employer].

## 2016-08-14 NOTE — Progress Notes (Signed)
Subjective:  By signing my name below, I, [name], attest that this documentation has been prepared under the direction and in the presence of Norberto Sorenson, MD Electronically Signed: Charline Bills, ED Scribe 08/14/2016 at 3:58 PM.   Patient ID: Diana [name], female    DOB: 30-Aug-1955, 61 y.o.   MRN: [id number]  Chief Complaint  Patient presents with  . Cough    Mild, X 2 weeks  . Sore Throat    X 2 weeks  . Shortness of Breath    X 2 weeks   HPI Diana [name] is a 61 y.o. female who presents to [facility] Care at [facility] complaining of ongoing sore throat for the past 2 weeks. Pt reports increased pain with swallowing. She reports associated symptoms of mild cough and a clogged sensation in the right ear. Pt reported feeling ill since November. Seen in December and treated with doxycyline for bronchitis as well as Tessalon and Tussionex. I saw her 6 weeks later and treated her for viral bronchitis and influenza exposure and Tamiflu, Tessalon and Mucinex. She has tried Flonase, Mucinex and Zyrtec without significant relief. Allergy to penicillins.   Past Medical History:  Diagnosis Date  . Allergy   . Arthritis    feet  . Carpal tunnel syndrome    Current Outpatient Prescriptions on File Prior to Visit  Medication Sig Dispense Refill  . cetirizine (ZYRTEC) 10 MG tablet Take 1 tablet (10 mg total) by mouth at bedtime. 90 tablet 3  . cyclobenzaprine (FLEXERIL) 10 MG tablet Take 1 tablet (10 mg total) by mouth 3 (three) times daily as needed for muscle spasms. 60 tablet 2  . fluticasone (FLONASE) 50 MCG/ACT nasal spray Place 2 sprays into both nostrils at bedtime. 15.8 g 11  . meloxicam (MOBIC) 15 MG tablet Take 1 tablet (15 mg total) by mouth daily. 30 tablet 5  . acetaminophen (TYLENOL) 500 MG tablet Take 500 mg by mouth every 6 (six) hours as needed.    Marland Kitchen albuterol (PROVENTIL HFA;VENTOLIN HFA) 108 (90 Base) MCG/ACT inhaler Inhale 2 puffs into the lungs every 4 (four) hours as needed  for wheezing or shortness of breath (cough, shortness of breath or wheezing.). (Patient not taking: Reported on 08/14/2016) 1 Inhaler 1  . benzonatate (TESSALON) 200 MG capsule Take 1 capsule (200 mg total) by mouth 3 (three) times daily as needed for cough. (Patient not taking: Reported on 08/14/2016) 60 capsule 0  . Guaifenesin (MUCINEX MAXIMUM STRENGTH) 1200 MG TB12 Take 1 tablet (1,200 mg total) by mouth every 12 (twelve) hours as needed. (Patient not taking: Reported on 08/14/2016) 14 tablet 1  . oseltamivir (TAMIFLU) 75 MG capsule Take 1 capsule (75 mg total) by mouth 2 (two) times daily. (Patient not taking: Reported on 08/14/2016) 10 capsule 0  . promethazine-dextromethorphan (PROMETHAZINE-DM) 6.25-15 MG/5ML syrup Take 5 mLs by mouth 4 (four) times daily as needed for cough. (Patient not taking: Reported on 08/14/2016) 180 mL 0   No current facility-administered medications on file prior to visit.    Allergies  Allergen Reactions  . Penicillins Rash  . Codeine Anxiety   Review of Systems  HENT: Positive for sore throat.   Respiratory: Positive for cough (mild).      Objective:   Physical Exam  Constitutional: She is oriented to person, place, and time. She appears well-developed and well-nourished. No distress.  HENT:  Head: Normocephalic and atraumatic.  Nose: Nose normal.  L TM: normal. R TM with cerumen.  Copious bloody postnasal drip.   Eyes: Conjunctivae and EOM are normal.  Neck: Neck supple. No tracheal deviation present.  Cardiovascular: Normal rate, regular rhythm, S1 normal, S2 normal and normal heart sounds.   Pulmonary/Chest: Effort normal and breath sounds normal. No respiratory distress.  Lungs clear.   Musculoskeletal: Normal range of motion.  Neurological: She is alert and oriented to person, place, and time.  Skin: Skin is warm and dry.  Psychiatric: She has a normal mood and affect. Her behavior is normal.  Nursing note and vitals reviewed.  BP 130/86   Pulse  100   Temp 98.2 F (36.8 C) (Oral)   Resp 18   Ht 5\' 3"  (1.6 m)   Wt 133 lb (60.3 kg)   SpO2 98%   BMI 23.56 kg/m     Assessment & Plan:   1. Acute recurrent sinusitis, unspecified location   2. Impacted cerumen of right ear     Orders Placed This Encounter  Procedures  . Ear wax removal    right  . Care order/instruction:    Scheduling Instructions:     Complete orders, AVS and go.    Meds ordered this encounter  Medications  . cefdinir (OMNICEF) 300 MG capsule    Sig: Take 1 capsule (300 mg total) by mouth 2 (two) times daily.    Dispense:  20 capsule    Refill:  0  . predniSONE (DELTASONE) 20 MG tablet    Sig: Take 3 tabs qd x 3d, then 2 tabs qd x 3d then 1 tab qd x 3d.    Dispense:  18 tablet    Refill:  0    I personally performed the services described in this documentation, which was scribed in my presence. The recorded information has been reviewed and considered, and addended by me as needed.   Norberto SorensonEva Shaw, M.D.  [facility] at [facility]  [place] [address] [place], [place] [address] [phone] phone [phone]) [phone] fax  09/17/16 4:40 PM

## 2016-09-17 ENCOUNTER — Ambulatory Visit (INDEPENDENT_AMBULATORY_CARE_PROVIDER_SITE_OTHER): Payer: BLUE CROSS/BLUE SHIELD

## 2016-09-17 ENCOUNTER — Ambulatory Visit (INDEPENDENT_AMBULATORY_CARE_PROVIDER_SITE_OTHER): Payer: BLUE CROSS/BLUE SHIELD | Admitting: Family Medicine

## 2016-09-17 VITALS — BP 130/86 | HR 88 | Temp 98.0°F | Resp 16 | Ht 62.0 in | Wt 134.4 lb

## 2016-09-17 DIAGNOSIS — L245 Irritant contact dermatitis due to other chemical products: Secondary | ICD-10-CM | POA: Diagnosis not present

## 2016-09-17 DIAGNOSIS — M62830 Muscle spasm of back: Secondary | ICD-10-CM

## 2016-09-17 DIAGNOSIS — J301 Allergic rhinitis due to pollen: Secondary | ICD-10-CM | POA: Diagnosis not present

## 2016-09-17 DIAGNOSIS — M7711 Lateral epicondylitis, right elbow: Secondary | ICD-10-CM | POA: Diagnosis not present

## 2016-09-17 DIAGNOSIS — M542 Cervicalgia: Secondary | ICD-10-CM

## 2016-09-17 DIAGNOSIS — I10 Essential (primary) hypertension: Secondary | ICD-10-CM | POA: Diagnosis not present

## 2016-09-17 IMAGING — DX DG CERVICAL SPINE 2 OR 3 VIEWS
3 series · 3 of 3 positions shown · non-contrast
Comparison: None.

CLINICAL DATA: Recurrent neck pain, radiculopathy

EXAM:
CERVICAL SPINE - 2-3 VIEW

[c-spine lat]
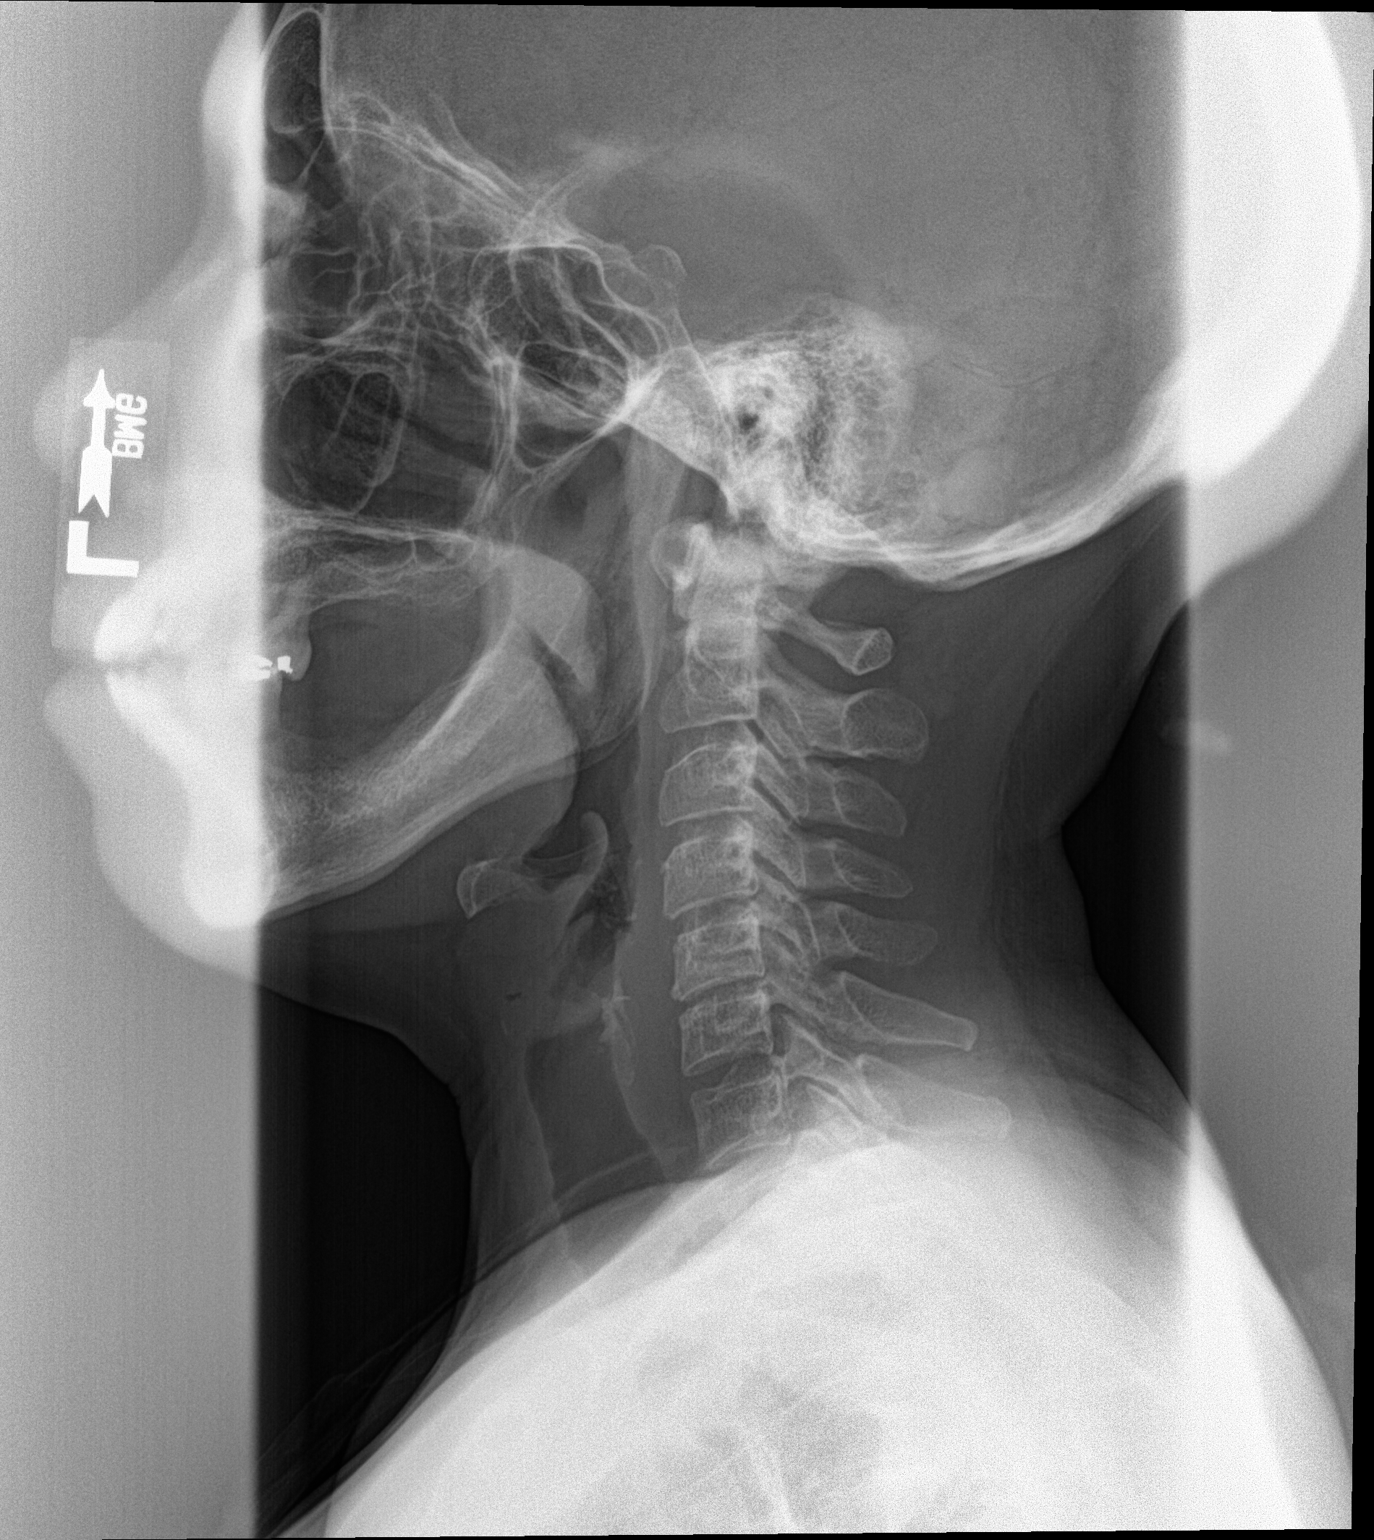

[c-spine ap]
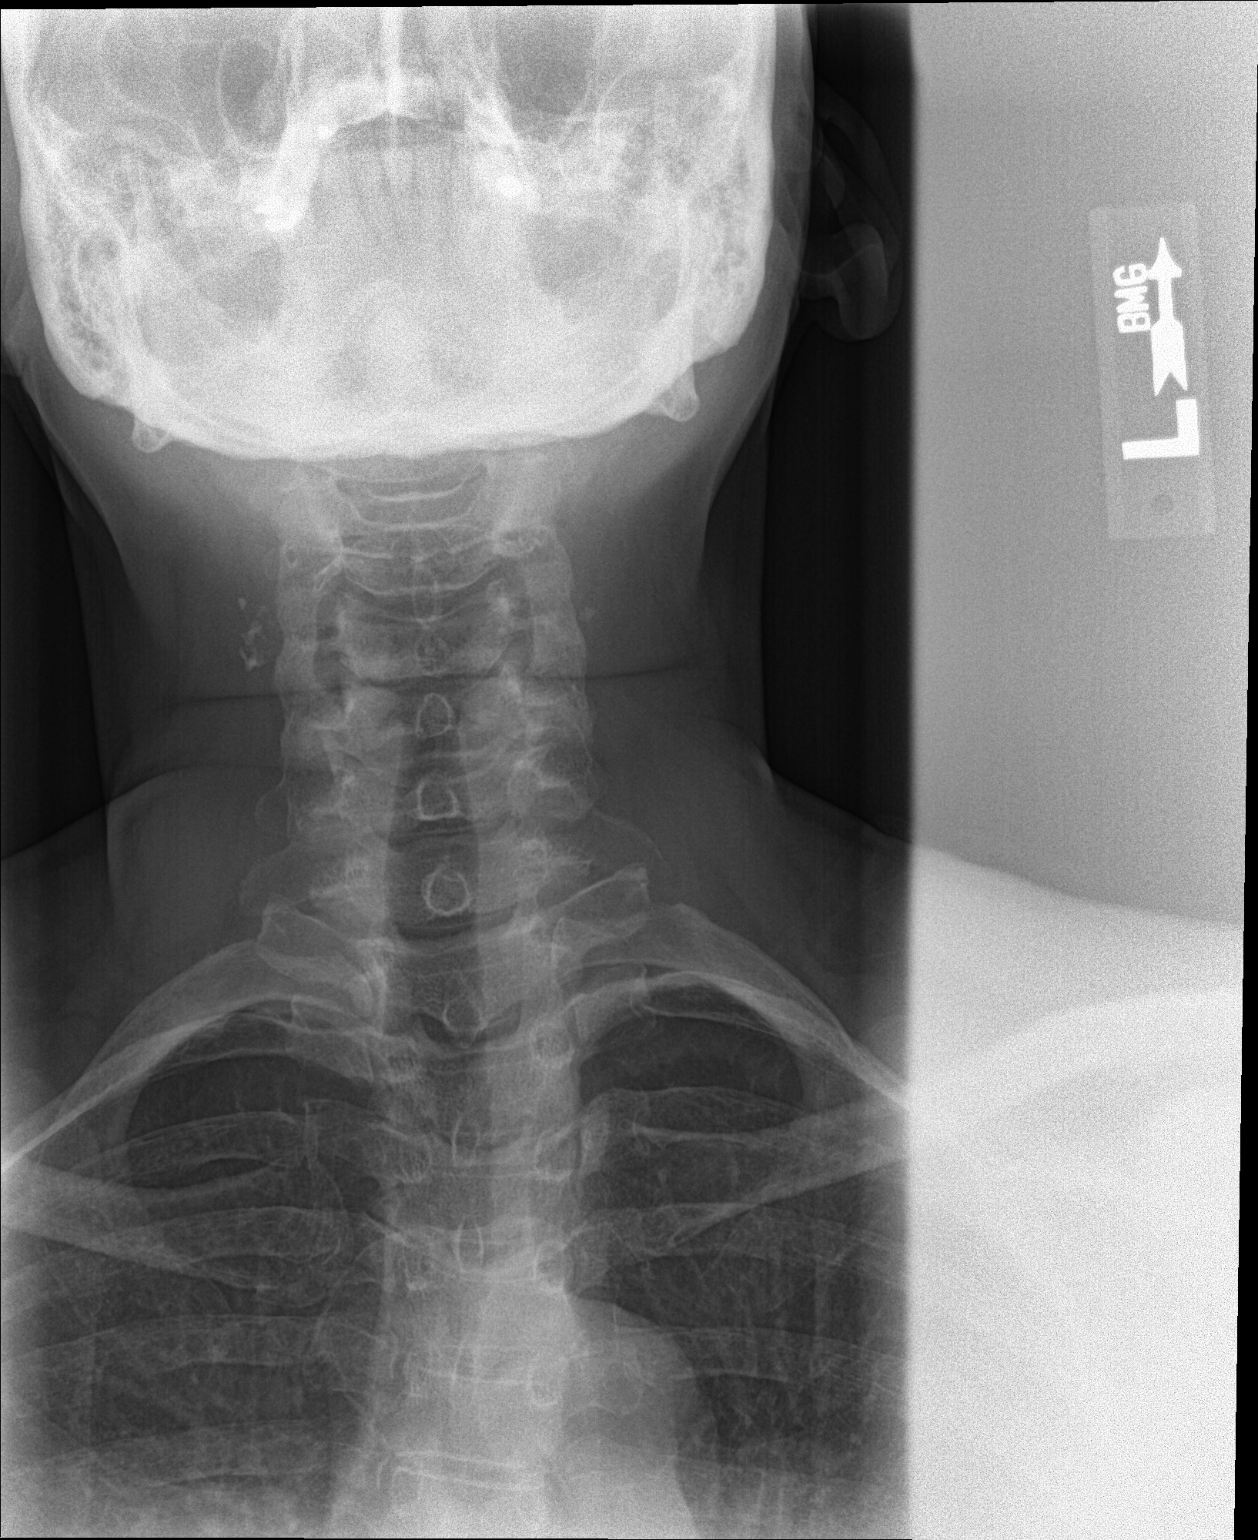

[c-spine open mouth]
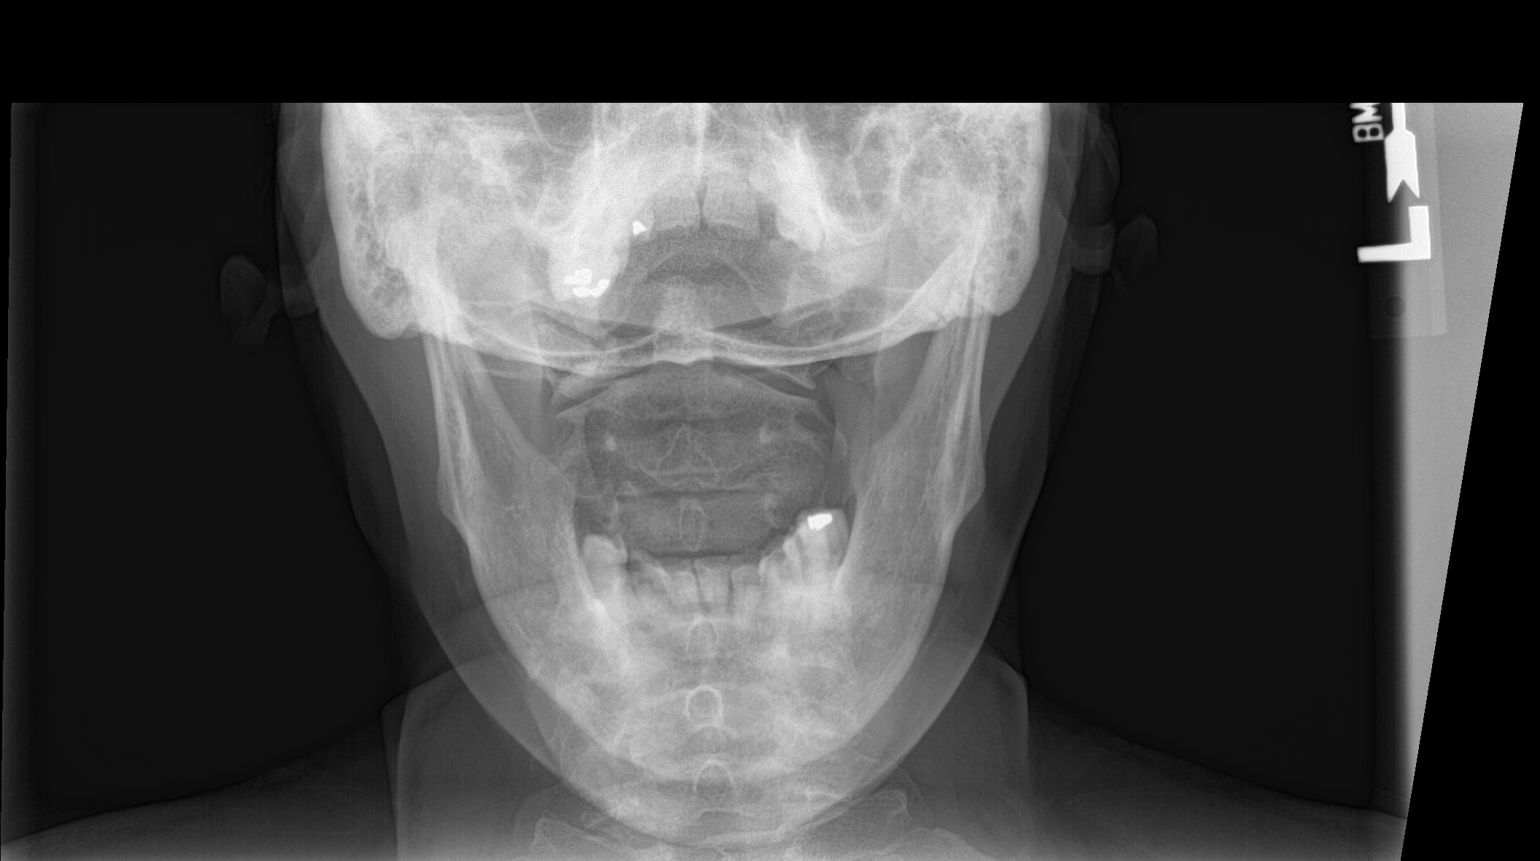

[3 of 3 positions shown; findings below may reference images not displayed]

FINDINGS: Three views of the cervical spine submitted. No acute fracture or
subluxation. Alignment and vertebral body heights are preserved.
Mild disc space flattening with minimal anterior spurring at C5-C6
level. No prevertebral soft tissue swelling. Cervical airway is
patent.
IMPRESSION: No acute fracture or subluxation. Mild disc space flattening with
minimal anterior spurring at C5-C6 level.

## 2016-09-17 MED ORDER — UREA 40 % EX OINT: TOPICAL_OINTMENT | Freq: Two times a day (BID) | CUTANEOUS | 2 refills | Status: DC

## 2016-09-17 MED ORDER — TIZANIDINE HCL 4 MG PO TABS: 4.0000 mg | ORAL_TABLET | Freq: Four times a day (QID) | ORAL | 0 refills | Status: DC | PRN

## 2016-09-17 MED ORDER — DICLOFENAC SODIUM 75 MG PO TBEC: 75.0000 mg | DELAYED_RELEASE_TABLET | Freq: Two times a day (BID) | ORAL | 1 refills | Status: DC

## 2016-09-17 MED ORDER — AZELASTINE-FLUTICASONE 137-50 MCG/ACT NA SUSP: 1.0000 | Freq: Two times a day (BID) | NASAL | 5 refills | Status: DC
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
## 2016-09-17 NOTE — Patient Instructions (Addendum)
IF you received an x-ray today, you will receive an invoice from [facility]. Please contact [facility] at [phone] with questions or concerns regarding your invoice.   IF you received labwork today, you will receive an invoice from [facility]. Please contact [facility] at [phone] with questions or concerns regarding your invoice.   Our billing staff will not be able to assist you with questions regarding bills from these companies.  You will be contacted with the lab results as soon as they are available. The fastest way to get your results is to activate your My Chart account. Instructions are located on the last page of this paperwork. If you have not heard from [place] regarding the results in 2 weeks, please contact this office.     Back Exercises The following exercises strengthen the muscles that help to support the back. They also help to keep the lower back flexible. Doing these exercises can help to prevent back pain or lessen existing pain. If you have back pain or discomfort, try doing these exercises 2-3 times each day or as told by your health care provider. When the pain goes away, do them once each day, but increase the number of times that you repeat the steps for each exercise (do more repetitions). If you do not have back pain or discomfort, do these exercises once each day or as told by your health care provider. Exercises Single Knee to Chest   Repeat these steps 3-5 times for each leg: 1. Lie on your back on a firm bed or the floor with your legs extended. 2. Bring one knee to your chest. Your other leg should stay extended and in contact with the floor. 3. Hold your knee in place by grabbing your knee or thigh. 4. Pull on your knee until you feel a gentle stretch in your lower back. 5. Hold the stretch for 10-30 seconds. 6. Slowly release and straighten your leg. Pelvic Tilt   Repeat these steps 5-10 times: 1. Lie on your back on a firm bed or  the floor with your legs extended. 2. Bend your knees so they are pointing toward the ceiling and your feet are flat on the floor. 3. Tighten your lower abdominal muscles to press your lower back against the floor. This motion will tilt your pelvis so your tailbone points up toward the ceiling instead of pointing to your feet or the floor. 4. With gentle tension and even breathing, hold this position for 5-10 seconds. Cat-Cow   Repeat these steps until your lower back becomes more flexible: 1. Get into a hands-and-knees position on a firm surface. Keep your hands under your shoulders, and keep your knees under your hips. You may place padding under your knees for comfort. 2. Let your head hang down, and point your tailbone toward the floor so your lower back becomes rounded like the back of a cat. 3. Hold this position for 5 seconds. 4. Slowly lift your head and point your tailbone up toward the ceiling so your back forms a sagging arch like the back of a cow. 5. Hold this position for 5 seconds. Press-Ups   Repeat these steps 5-10 times: 1. Lie on your abdomen (face-down) on the floor. 2. Place your palms near your head, about shoulder-width apart. 3. While you keep your back as relaxed as possible and keep your hips on the floor, slowly straighten your arms to raise the top half of your body and lift your shoulders. Do not  use your back muscles to raise your upper torso. You may adjust the placement of your hands to make yourself more comfortable. 4. Hold this position for 5 seconds while you keep your back relaxed. 5. Slowly return to lying flat on the floor. Bridges   Repeat these steps 10 times: 1. Lie on your back on a firm surface. 2. Bend your knees so they are pointing toward the ceiling and your feet are flat on the floor. 3. Tighten your buttocks muscles and lift your buttocks off of the floor until your waist is at almost the same height as your knees. You should feel the muscles  working in your buttocks and the back of your thighs. If you do not feel these muscles, slide your feet 1-2 inches farther away from your buttocks. 4. Hold this position for 3-5 seconds. 5. Slowly lower your hips to the starting position, and allow your buttocks muscles to relax completely. If this exercise is too easy, try doing it with your arms crossed over your chest. Abdominal Crunches   Repeat these steps 5-10 times: 1. Lie on your back on a firm bed or the floor with your legs extended. 2. Bend your knees so they are pointing toward the ceiling and your feet are flat on the floor. 3. Cross your arms over your chest. 4. Tip your chin slightly toward your chest without bending your neck. 5. Tighten your abdominal muscles and slowly raise your trunk (torso) high enough to lift your shoulder blades a tiny bit off of the floor. Avoid raising your torso higher than that, because it can put too much stress on your low back and it does not help to strengthen your abdominal muscles. 6. Slowly return to your starting position. Back Lifts  Repeat these steps 5-10 times: 1. Lie on your abdomen (face-down) with your arms at your sides, and rest your forehead on the floor. 2. Tighten the muscles in your legs and your buttocks. 3. Slowly lift your chest off of the floor while you keep your hips pressed to the floor. Keep the back of your head in line with the curve in your back. Your eyes should be looking at the floor. 4. Hold this position for 3-5 seconds. 5. Slowly return to your starting position. Contact a health care provider if:  Your back pain or discomfort gets much worse when you do an exercise.  Your back pain or discomfort does not lessen within 2 hours after you exercise. If you have any of these problems, stop doing these exercises right away. Do not do them again unless your health care provider says that you can. Get help right away if:  You develop sudden, severe back pain. If  this happens, stop doing the exercises right away. Do not do them again unless your health care provider says that you can. This information is not intended to replace advice given to you by your health care provider. Make sure you discuss any questions you have with your health care provider. Document Released: 06/24/2004 Document Revised: 09/24/2015 Document Reviewed: 07/11/2014 Elsevier Interactive Patient Education  2017 [employer].  Tennis Elbow Rehab Ask your health care provider which exercises are safe for you. Do exercises exactly as told by your health care provider and adjust them as directed. It is normal to feel mild stretching, pulling, tightness, or discomfort as you do these exercises, but you should stop right away if you feel sudden pain or your pain gets worse. Do not  begin these exercises until told by your health care provider. Stretching and range of motion exercises These exercises warm up your muscles and joints and improve the movement and flexibility of your elbow. These exercises also help to relieve pain, numbness, and tingling. Exercise A: Wrist extensor stretch  7. Extend your left / right elbow with your fingers pointing down. 8. Gently pull the palm of your left / right hand toward you until you feel a gentle stretch on the top of your forearm. 9. To increase the stretch, push your left / right hand toward the outer edge or pinkie side of your forearm. 10. Hold this position for __________ seconds. Repeat __________ times. Complete this exercise __________ times a day. If directed by your health care provider, repeat this stretch except do it with a bent elbow this time. Exercise B: Wrist flexor stretch   5. Extend your left / right elbow and turn your palm upward. 6. Gently pull your left / right palm and fingertips back so your wrist extends and your fingers point more toward the ground. 7. You should feel a gentle stretch on the inside of your  forearm. 8. Hold this position for __________ seconds. Repeat __________ times. Complete this exercise __________ times a day. If directed by your health care provider, repeat this stretch except do it with a bent elbow this time. Strengthening exercises These exercises build strength and endurance in your elbow. Endurance is the ability to use your muscles for a long time, even after they get tired. Exercise C: Wrist extensors   6. Sit with your left / right forearm palm-down and fully supported on a table or countertop. Your elbow should be resting below the height of your shoulder. 7. Let your left / right wrist extend over the edge of the surface. 8. Loosely hold a __________ weight or a piece of rubber exercise band or tubing in your left / right hand. Slowly curl your left / right hand up toward your forearm. If you are using band or tubing, hold the band or tubing in place with your other hand to provide resistance. 9. Hold this position for __________ seconds. 10. Slowly return to the starting position. Repeat __________ times. Complete this exercise __________ times a day. Exercise D: Radial deviators   6. Stand with a __________ weight in your left / righthand. Or, sit while holding a rubber exercise band or tubing with your other arm supported on a table or countertop. Position your hand so your thumb is on top. 7. Raise your hand upward in front of you so your thumb travels toward your forearm, or pull up on the rubber tubing. 8. Hold this position for __________ seconds. 9. Slowly return to the starting position. Repeat __________ times. Complete this exercise __________ times a day. Exercise E: Eccentric wrist extensors  6. Sit with your left / right forearm palm-down and fully supported on a table or countertop. Your elbow should be resting below the height of your shoulder. 7. If told by your health care provider, hold a __________ weight in your hand. 8. Let your left /  right wrist extend over the edge of the surface. 9. Use your other hand to lift up your left / right hand toward your forearm. Keep your forearm on the table. 10. Using only the muscles in your left / right hand, slowly lower your hand back down to the starting position. Repeat __________ times. Complete this exercise __________ times a day. This information  is not intended to replace advice given to you by your health care provider. Make sure you discuss any questions you have with your health care provider. Document Released: 05/17/2005 Document Revised: 01/21/2016 Document Reviewed: 02/13/2015 Elsevier Interactive Patient Education  2017 [employer].

## 2016-10-12 ENCOUNTER — Ambulatory Visit (INDEPENDENT_AMBULATORY_CARE_PROVIDER_SITE_OTHER): Payer: BLUE CROSS/BLUE SHIELD | Admitting: Urgent Care

## 2016-10-12 ENCOUNTER — Encounter: Payer: Self-pay | Admitting: Urgent Care

## 2016-10-12 VITALS — BP 140/86 | HR 78 | Temp 98.3°F | Resp 17 | Ht 62.0 in | Wt 132.0 lb

## 2016-10-12 DIAGNOSIS — M542 Cervicalgia: Secondary | ICD-10-CM | POA: Diagnosis not present

## 2016-10-12 DIAGNOSIS — M25511 Pain in right shoulder: Secondary | ICD-10-CM | POA: Diagnosis not present

## 2016-10-12 DIAGNOSIS — M2578 Osteophyte, vertebrae: Secondary | ICD-10-CM | POA: Diagnosis not present

## 2016-10-12 DIAGNOSIS — M62838 Other muscle spasm: Secondary | ICD-10-CM | POA: Diagnosis not present

## 2016-10-12 MED ORDER — METHOCARBAMOL 500 MG PO TABS: 500.0000 mg | ORAL_TABLET | Freq: Four times a day (QID) | ORAL | 1 refills | Status: DC

## 2016-10-12 MED ORDER — PREDNISONE 20 MG PO TABS: ORAL_TABLET | ORAL | 0 refills | Status: DC

## 2016-10-12 NOTE — Patient Instructions (Addendum)
Muscle Cramps and Spasms Muscle cramps and spasms occur when a muscle or muscles tighten and you have no control over this tightening (involuntary muscle contraction). They are a common problem and can develop in any muscle. The most common place is in the calf muscles of the leg. Muscle cramps and muscle spasms are both involuntary muscle contractions, but there are some differences between the two:  Muscle cramps are painful. They come and go and may last a few seconds to 15 minutes. Muscle cramps are often more forceful and last longer than muscle spasms.  Muscle spasms may or may not be painful. They may also last just a few seconds or much longer. Certain medical conditions, such as diabetes or Parkinson disease, can make it more likely to develop cramps or spasms. However, cramps or spasms are usually not caused by a serious underlying problem. Common causes include:  Overexertion.  Overuse from repetitive motions, or doing the same thing over and over.  Remaining in a certain position for a long period of time.  Improper preparation, form, or technique while playing a sport or doing an activity.  Dehydration.  Injury.  Side effects of some medicines.  Abnormally low levels of the salts and ions in your blood (electrolytes), especially potassium and calcium. This could happen if you are taking water pills (diuretics) or if you are pregnant. In many cases, the cause of muscle cramps or spasms is unknown. Follow these instructions at home:  Stay well hydrated. Drink enough fluid to keep your urine clear or pale yellow.  Try massaging, stretching, and relaxing the affected muscle.  If directed, apply heat to tight or tense muscles as often as told by your health care provider. Use the heat source that your health care provider recommends, such as a moist heat pack or a heating pad.  Place a towel between your skin and the heat source.  Leave the heat on for 20-30  minutes.  Remove the heat if your skin turns bright red. This is especially important if you are unable to feel pain, heat, or cold. You may have a greater risk of getting burned.  If directed, put ice on the affected area. This may help if you are sore or have pain after a cramp or spasm.  Put ice in a plastic bag.  Place a towel between your skin and the bag.  Leavethe ice on for 20 minutes, 2-3 times a day.  Take over-the-counter and prescription medicines only as told by your health care provider.  Pay attention to any changes in your symptoms. Contact a health care provider if:  Your cramps or spasms get more severe or happen more often.  Your cramps or spasms do not improve over time. This information is not intended to replace advice given to you by your health care provider. Make sure you discuss any questions you have with your health care provider. Document Released: 11/06/2001 Document Revised: 06/18/2015 Document Reviewed: 02/18/2015 Elsevier Interactive Patient Education  2017 [employer]     IF you received an x-ray today, you will receive an invoice from [facility]. Please contact [facility] at [phone] with questions or concerns regarding your invoice.   IF you received labwork today, you will receive an invoice from [facility]. Please contact [facility] at [phone] with questions or concerns regarding your invoice.   Our billing staff will not be able to assist you with questions regarding bills from these companies.  You will be contacted with the  lab results as soon as they are available. The fastest way to get your results is to activate your My Chart account. Instructions are located on the last page of this paperwork. If you have not heard from [place] regarding the results in 2 weeks, please contact this office.

## 2016-10-12 NOTE — Progress Notes (Signed)
  MRN: [id number] DOB: 03/25/1956  Subjective:   Diana [name] is a 61 y.o. female presenting for chief complaint of Neck Pain and Shoulder Pain  Reports 2 week history of worsening neck and shoulder pain. Pain is constant and aching, non-radiating. 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 Has tried diclofenac. She is also using Zanaflex with minimal relief. Denies trauma, fever, swelling, redness, warmth, weakness, shooting pain, numbness or tingling.  Diana [name] has a current medication list which includes the following prescription(s): azelastine-fluticasone, cetirizine, diclofenac, and tizanidine. Also is allergic to penicillins and codeine.  Diana [name]  has a past medical history of Allergy; Arthritis; and Carpal tunnel syndrome. Also  has a past surgical history that includes Tubal ligation; Wrist surgery (Right); and Carpal tunnel release (Left, 02/12/2016).  Objective:   Vitals: BP (!) 146/84   Pulse 78   Temp 98.3 F (36.8 C) (Oral)   Resp 17   Ht 5\' 2"  (1.575 m)   Wt 132 lb (59.9 kg)   SpO2 97%   BMI 24.14 kg/m   BP Readings from Last 3 Encounters:  10/12/16 (!) 146/84  09/17/16 130/86  08/14/16 130/86    Physical Exam  Constitutional: She is oriented to person, place, and time. She appears well-developed and well-nourished.  Cardiovascular: Normal rate.   Pulmonary/Chest: Effort normal.  Musculoskeletal:       Right shoulder: She exhibits decreased range of motion (external rotation). She exhibits no tenderness, no bony tenderness, no swelling, no effusion, no crepitus, no deformity, no laceration, no spasm and normal strength.       Cervical back: She exhibits decreased range of motion (extension, lateral flexion) and tenderness (over right trapezius, lower spinous processes over neck). She exhibits no bony tenderness, no swelling, no edema, no deformity, no laceration and no spasm.  Neurological: She is alert  and oriented to person, place, and time.  Skin: Skin is warm and dry.   Assessment and Plan :   1. Cervicalgia 2. Osteophyte of cervical spine 3. Trapezius muscle spasm 4. Acute pain of right shoulder - Will start conservative management for inflammatory process secondary to overuse at work. Will start short steroid course. Hold off on diclofenac. Start Robaxin.  Counseled patient on potential for adverse effects with medications prescribed today, she verbalized understanding. Patient does not want to start PT due to cost burden. She does not want work restrictions because she will not be allowed to work and does not have any paid time off. Follow up in 1 week if no improvement.  Wallis BambergMario Kewana Sanon, PA-C Primary Care at [facility] [phone] 10/12/2016  8:40 AM

## 2016-10-29 ENCOUNTER — Ambulatory Visit (INDEPENDENT_AMBULATORY_CARE_PROVIDER_SITE_OTHER): Payer: BLUE CROSS/BLUE SHIELD

## 2016-10-29 ENCOUNTER — Encounter (HOSPITAL_COMMUNITY): Payer: Self-pay | Admitting: Emergency Medicine

## 2016-10-29 ENCOUNTER — Encounter: Payer: Self-pay | Admitting: Family Medicine

## 2016-10-29 ENCOUNTER — Emergency Department (HOSPITAL_COMMUNITY): Payer: BLUE CROSS/BLUE SHIELD

## 2016-10-29 ENCOUNTER — Ambulatory Visit (INDEPENDENT_AMBULATORY_CARE_PROVIDER_SITE_OTHER): Payer: BLUE CROSS/BLUE SHIELD | Admitting: Family Medicine

## 2016-10-29 VITALS — BP 135/78 | HR 89 | Temp 98.0°F | Resp 16 | Ht 62.0 in | Wt 130.6 lb

## 2016-10-29 DIAGNOSIS — R079 Chest pain, unspecified: Secondary | ICD-10-CM | POA: Diagnosis not present

## 2016-10-29 DIAGNOSIS — D509 Iron deficiency anemia, unspecified: Secondary | ICD-10-CM | POA: Diagnosis not present

## 2016-10-29 DIAGNOSIS — D709 Neutropenia, unspecified: Secondary | ICD-10-CM

## 2016-10-29 DIAGNOSIS — J3089 Other allergic rhinitis: Secondary | ICD-10-CM

## 2016-10-29 DIAGNOSIS — I214 Non-ST elevation (NSTEMI) myocardial infarction: Principal | ICD-10-CM | POA: Diagnosis present

## 2016-10-29 DIAGNOSIS — R0981 Nasal congestion: Secondary | ICD-10-CM

## 2016-10-29 DIAGNOSIS — D649 Anemia, unspecified: Secondary | ICD-10-CM

## 2016-10-29 DIAGNOSIS — J40 Bronchitis, not specified as acute or chronic: Secondary | ICD-10-CM | POA: Diagnosis present

## 2016-10-29 DIAGNOSIS — Z880 Allergy status to penicillin: Secondary | ICD-10-CM

## 2016-10-29 DIAGNOSIS — E559 Vitamin D deficiency, unspecified: Secondary | ICD-10-CM

## 2016-10-29 DIAGNOSIS — R0600 Dyspnea, unspecified: Secondary | ICD-10-CM

## 2016-10-29 DIAGNOSIS — E785 Hyperlipidemia, unspecified: Secondary | ICD-10-CM | POA: Diagnosis present

## 2016-10-29 DIAGNOSIS — M542 Cervicalgia: Secondary | ICD-10-CM | POA: Diagnosis present

## 2016-10-29 DIAGNOSIS — R0609 Other forms of dyspnea: Secondary | ICD-10-CM

## 2016-10-29 DIAGNOSIS — M25519 Pain in unspecified shoulder: Secondary | ICD-10-CM | POA: Diagnosis present

## 2016-10-29 DIAGNOSIS — I10 Essential (primary) hypertension: Secondary | ICD-10-CM | POA: Diagnosis present

## 2016-10-29 DIAGNOSIS — Z79899 Other long term (current) drug therapy: Secondary | ICD-10-CM

## 2016-10-29 DIAGNOSIS — K219 Gastro-esophageal reflux disease without esophagitis: Secondary | ICD-10-CM | POA: Diagnosis present

## 2016-10-29 DIAGNOSIS — R9431 Abnormal electrocardiogram [ECG] [EKG]: Secondary | ICD-10-CM

## 2016-10-29 LAB — POCT CBC
Granulocyte percent: 40.2 %G (ref 37–80)
HCT, POC: 31.1 % — AB (ref 37.7–47.9)
Hemoglobin: 10.4 g/dL — AB (ref 12.2–16.2)
LYMPH, POC: 1.5 (ref 0.6–3.4)
MCH, POC: 25.3 pg — AB (ref 27–31.2)
MCHC: 33.6 g/dL (ref 31.8–35.4)
MCV: 75.3 fL — AB (ref 80–97)
MID (CBC): 0.2 (ref 0–0.9)
MPV: 7.4 fL (ref 0–99.8)
POC GRANULOCYTE: 1.2 — AB (ref 2–6.9)
POC LYMPH PERCENT: 52.5 %L — AB (ref 10–50)
POC MID %: 7.3 % (ref 0–12)
Platelet Count, POC: 142 10*3/uL (ref 142–424)
RBC: 4.13 M/uL (ref 4.04–5.48)
RDW, POC: 14.2 %
WBC: 2.9 10*3/uL — AB (ref 4.6–10.2)

## 2016-10-29 LAB — I-STAT TROPONIN, ED: TROPONIN I, POC: 0.28 ng/mL — AB (ref 0.00–0.08)

## 2016-10-29 LAB — CBC
HCT: 32.3 % — ABNORMAL LOW (ref 36.0–46.0)
Hemoglobin: 10.5 g/dL — ABNORMAL LOW (ref 12.0–15.0)
MCH: 24.5 pg — AB (ref 26.0–34.0)
MCHC: 32.5 g/dL (ref 30.0–36.0)
MCV: 75.5 fL — ABNORMAL LOW (ref 78.0–100.0)
PLATELETS: 152 10*3/uL (ref 150–400)
RBC: 4.28 MIL/uL (ref 3.87–5.11)
RDW: 14.1 % (ref 11.5–15.5)
WBC: 2.9 10*3/uL — ABNORMAL LOW (ref 4.0–10.5)

## 2016-10-29 IMAGING — CR DG CHEST 2V
2 series · 2 of 2 positions shown · non-contrast
Comparison: Chest radiograph performed earlier today at [DATE] p.m.

CLINICAL DATA: Acute onset of shortness of breath and generalized
chest pain on exertion. Initial encounter.

EXAM:
CHEST  2 VIEW

[w chest pa]
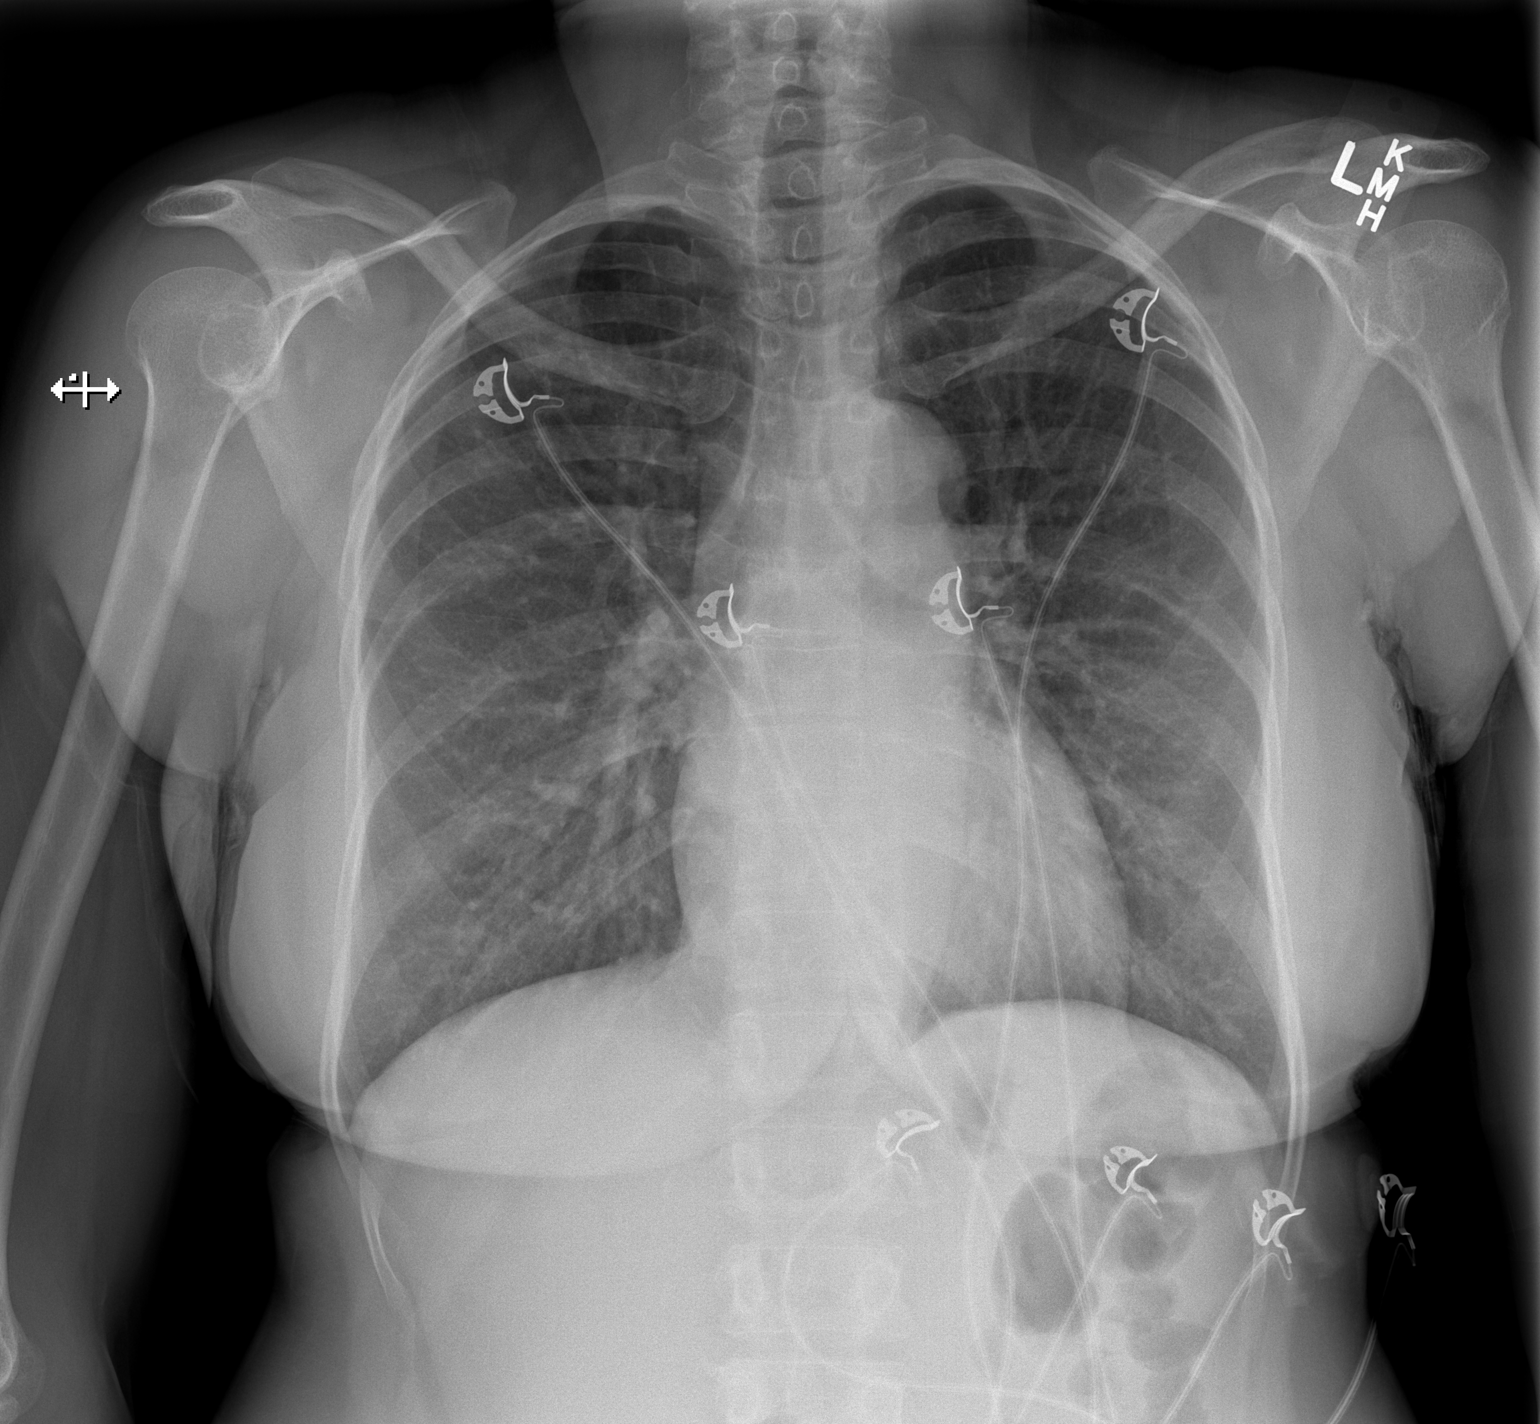

[w chest lat]
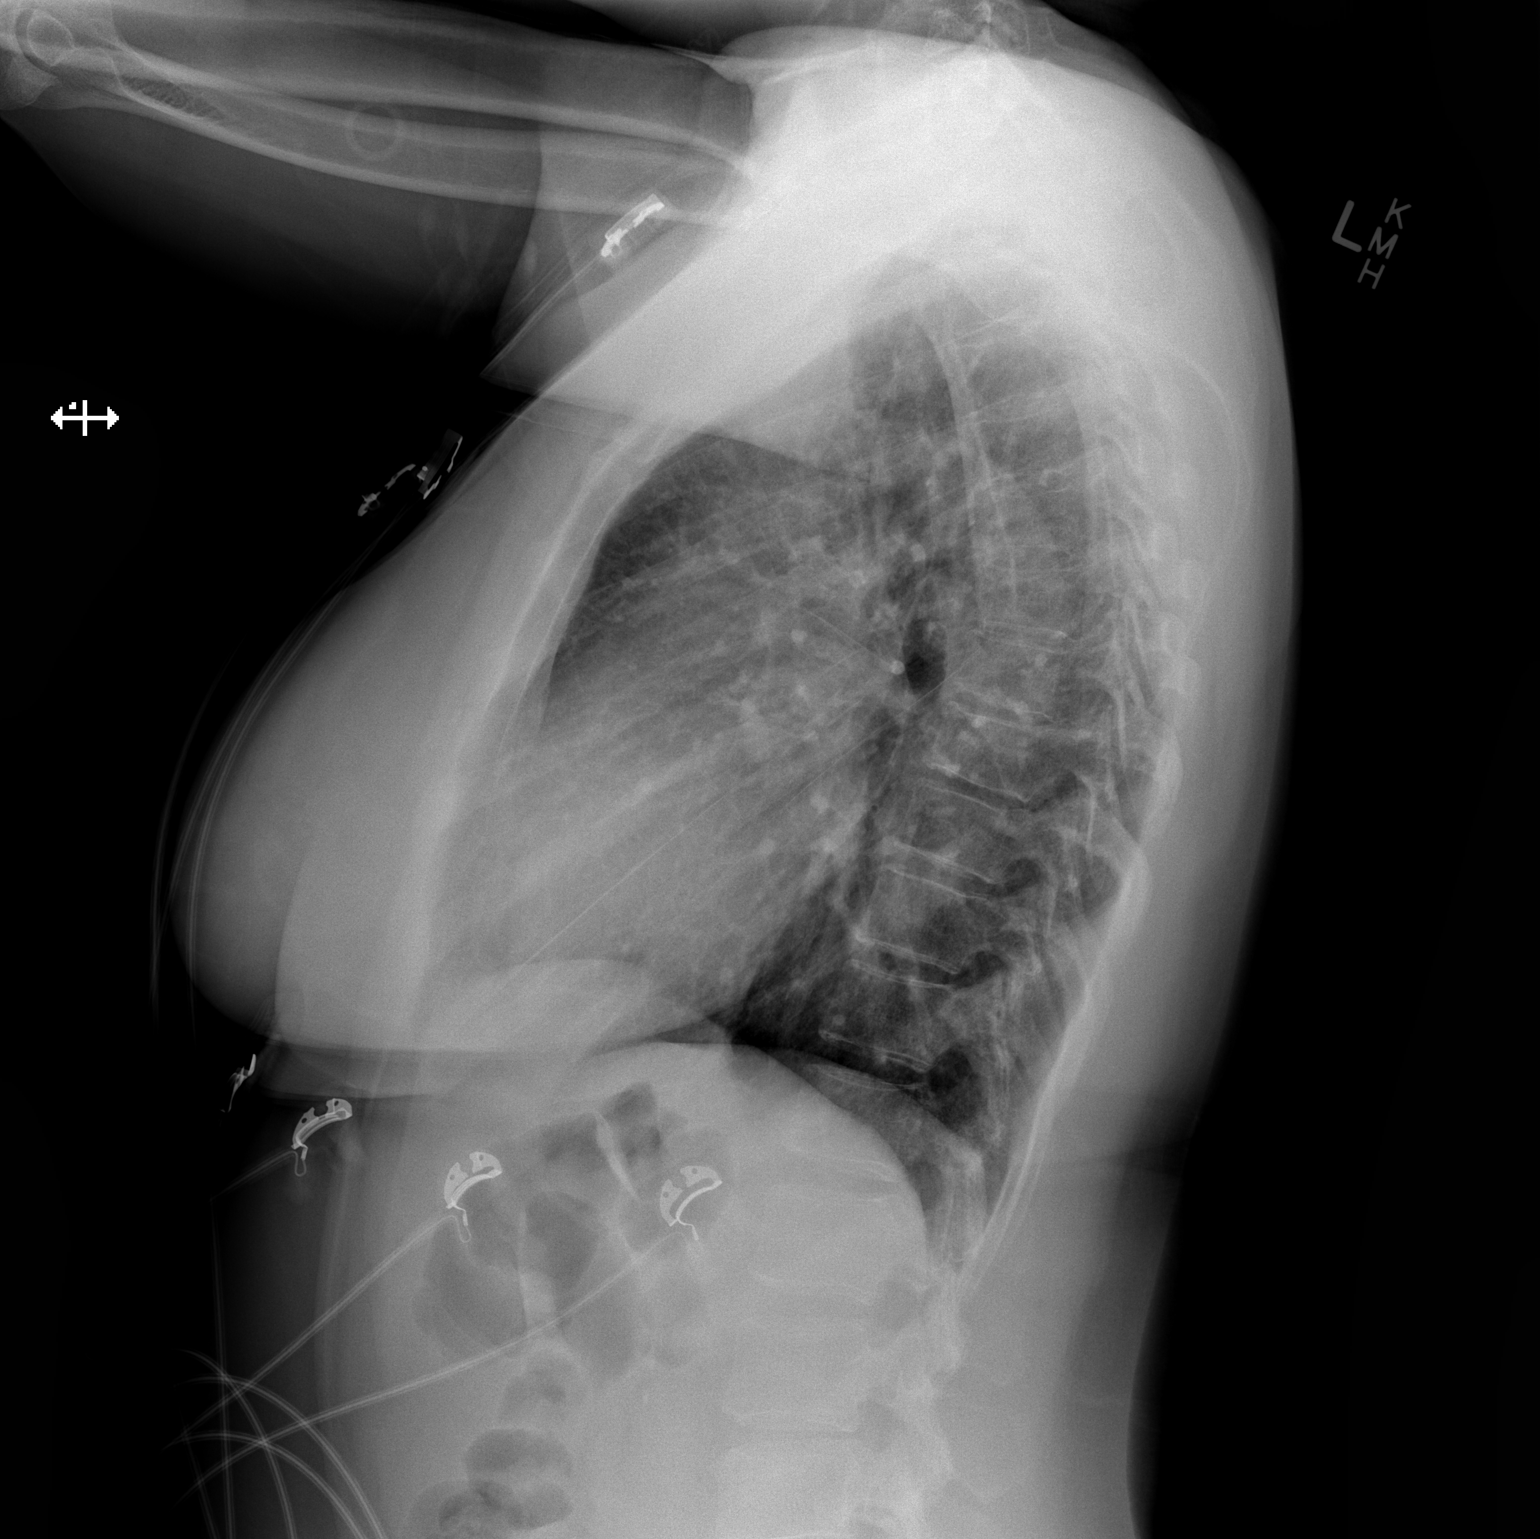

[2 of 2 positions shown; findings below may reference images not displayed]

FINDINGS: The lungs are well-aerated and clear. There is no evidence of focal
opacification, pleural effusion or pneumothorax.

The heart is normal in size; the mediastinal contour is within
normal limits. No acute osseous abnormalities are seen.
IMPRESSION: No acute cardiopulmonary process seen.

## 2016-10-29 IMAGING — DX DG CHEST 2V
2 series · 2 of 2 positions shown · non-contrast
Comparison: [DATE]

CLINICAL DATA: Worsening shortness of Breath

EXAM:
CHEST  2 VIEW

[chest pa]
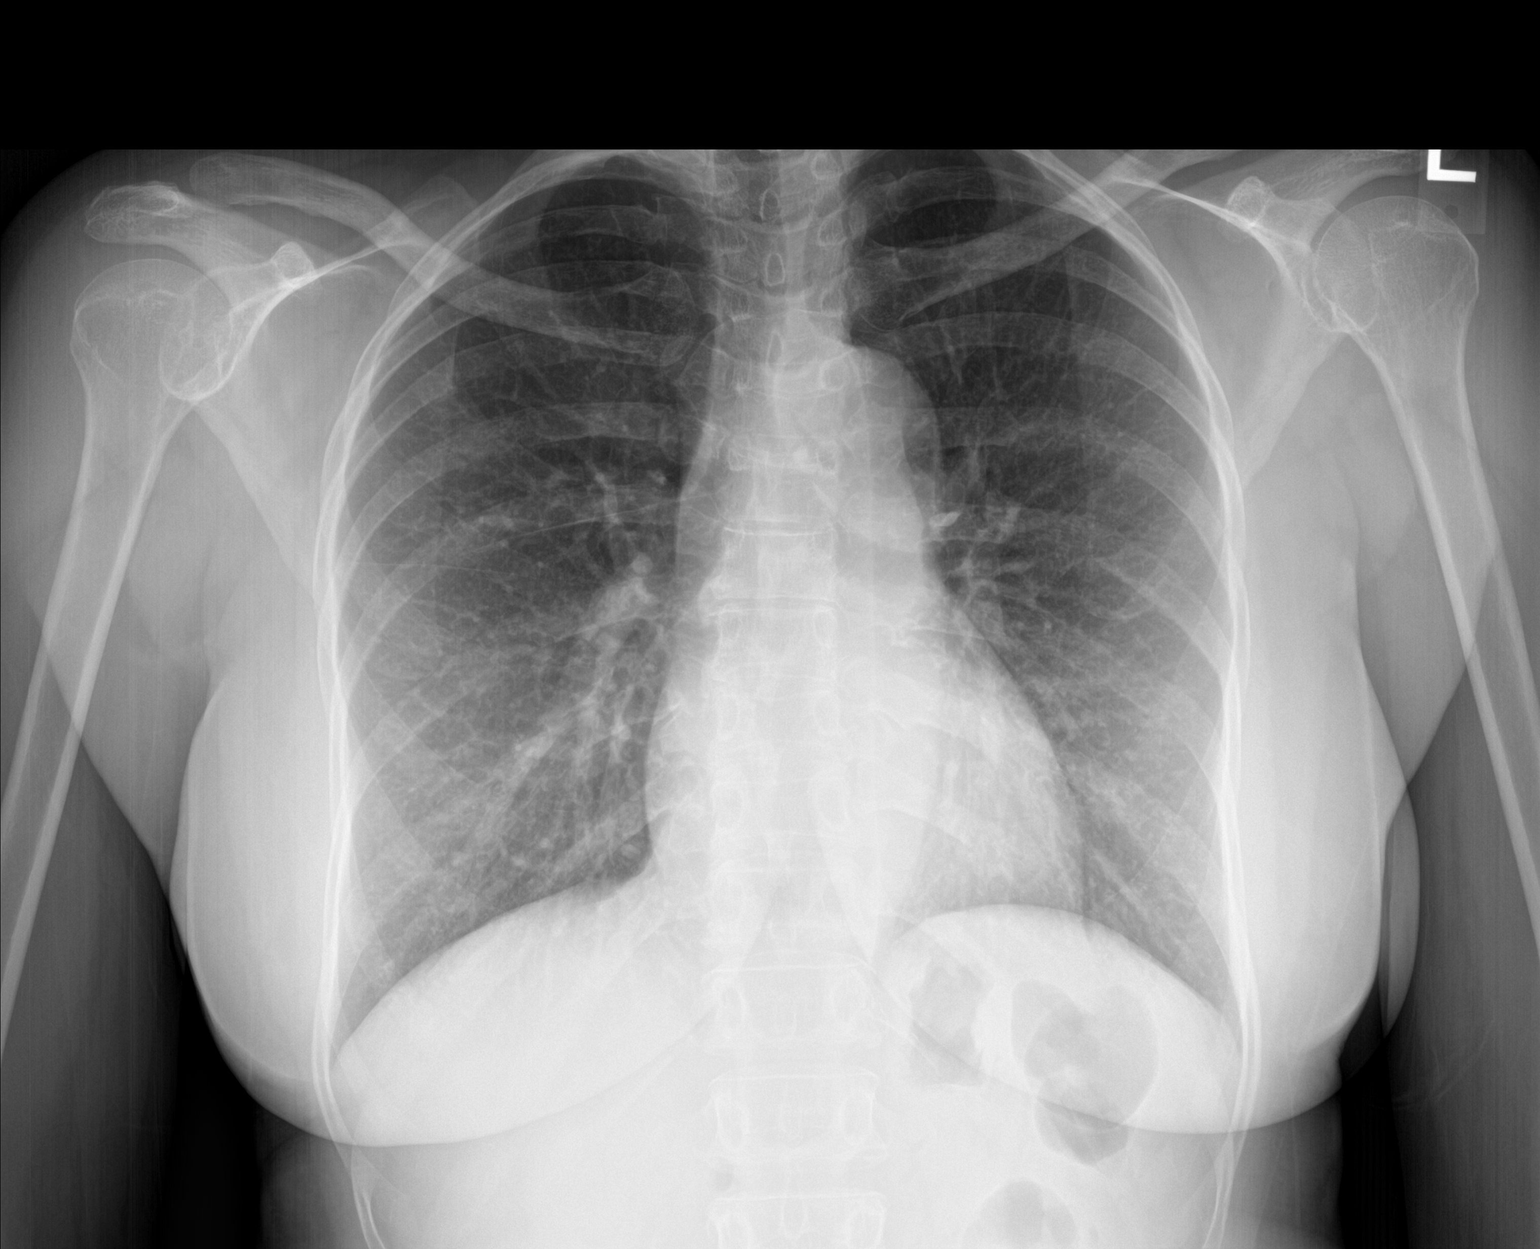

[chest lat]
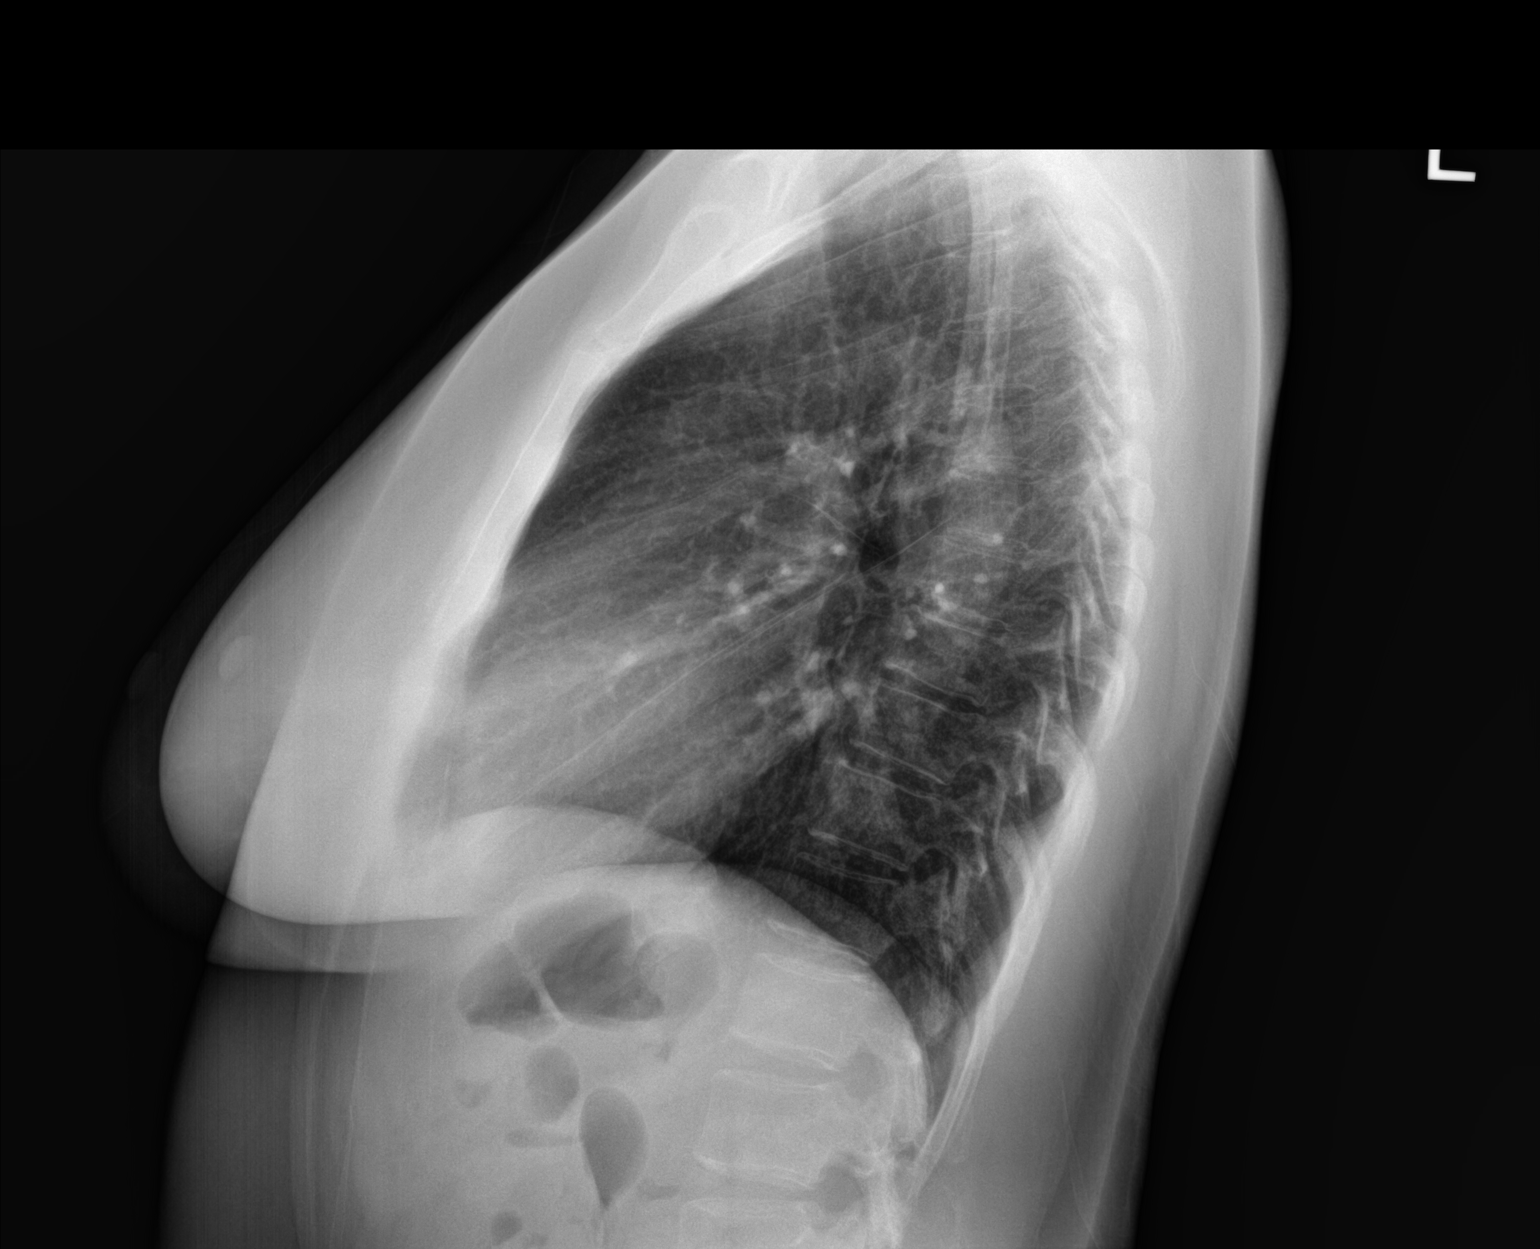

[2 of 2 positions shown; findings below may reference images not displayed]

FINDINGS: The heart size and mediastinal contours are within normal limits.
Both lungs are clear. The visualized skeletal structures are
unremarkable.
IMPRESSION: No active cardiopulmonary disease.

## 2016-10-29 MED ORDER — ALBUTEROL SULFATE (2.5 MG/3ML) 0.083% IN NEBU
2.5000 mg | INHALATION_SOLUTION | Freq: Once | RESPIRATORY_TRACT | Status: AC
  Administered 2016-10-29: 2.5 mg via RESPIRATORY_TRACT

## 2016-10-29 MED ORDER — OMEPRAZOLE 40 MG PO CPDR: 40.0000 mg | DELAYED_RELEASE_CAPSULE | Freq: Every day | ORAL | 1 refills | Status: DC

## 2016-10-29 MED ORDER — MONTELUKAST SODIUM 10 MG PO TABS: 10.0000 mg | ORAL_TABLET | Freq: Every day | ORAL | 3 refills | Status: DC

## 2016-10-29 MED ORDER — CETIRIZINE HCL 10 MG PO TABS: 10.0000 mg | ORAL_TABLET | Freq: Every day | ORAL | 3 refills | Status: DC

## 2016-10-29 NOTE — Progress Notes (Signed)
Subjective:  By signing my name below, I, [name], attest that this documentation has been prepared under the direction and in the presence of Norberto Sorenson, MD Electronically Signed: Charline Bills, ED Scribe 10/29/2016 at 4:08 PM.   Patient ID: Diana [name], female    DOB: 1955/10/07, 61 y.o.   MRN: [id number]  Chief Complaint  Patient presents with  . Follow-up    neck and shoulder pain  . Shortness of Breath    patient states that she has been having increased difficulty breathing, *patient made aware that provider may or may not address at this visit*   HPI Diana [name] is a 61 y.o. female who presents to Primary Care at [facility] for a follow-up regarding neck and shoulder pain. Initially seen 6 weeks prior for 2 weeks of neck pain. She had a h/o several months of worsening neck strain that resulted in 2 weeks of acute neck pain, worse with movement and causing more HAs. Unresponsive to the Flexeril and Mobic so transitioned pt to Zanaflex and Diclofenac with heat, stretch and massage. She did have mild DDD with minimal spurring at C5/6 and diffuse cervical muscle spasms so was referred to PT for myofacial release and massage. Pt was then seen by colleague Urban Gibson 2 weeks prior as medications were providing minimal relief and pain became constant. She was placed on Prednisone 40 mg x 4 days and Methocarbamol 500 mg qid as she was unable to afford PT. She does a significant amount of heavy lifting, pushing and pulling at work which she was unable to decrease.   Today, pt states that neck pain has only slightly improved. She states that Prednisone improved her symptoms significantly. Pt stopped Diclofenac when she was started in Clindamycin. She was started on Clindamycin 300 mg qid for 10 days on 5/27 for environmental allergies with symptoms of rhinorrhea and HAs, prescribed by a doctor at work. She denies itchy or watery eyes. Pt has noticed loose stools since starting the medication. 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   Shortness of Breath  Pt has noticed sob with carrying objects while walking up stairs for "a while". She reports a non-radiating burning/warmth sensation in her central chest accompanied with nausea, dizziness and diaphoresis. Pt states that spells seem to happen more frequent. She states she last experienced the central chest pain approximately 4 times today. She denies sob at rest. Pt also denies a h/o heartburn, indigestion, melena, constipation. No previous diagnosis of asthma. Pt has taken Vitamin D in the past but is no longer taking it. No personal or family h/o sickle cell or trait.   Past Medical History:  Diagnosis Date  . Allergy   . Arthritis    feet  . Carpal tunnel syndrome    Current Outpatient Prescriptions on File Prior to Visit  Medication Sig Dispense Refill  . Azelastine-Fluticasone 137-50 MCG/ACT SUSP Place 1 spray into the nose 2 (two) times daily. 1 Bottle 5  . cetirizine (ZYRTEC) 10 MG tablet Take 1 tablet (10 mg total) by mouth at bedtime. (Patient not taking: Reported on 10/29/2016) 90 tablet 3  . diclofenac (VOLTAREN) 75 MG EC tablet Take 1 tablet (75 mg total) by mouth 2 (two) times daily. (Patient not taking: Reported on 10/29/2016) 60 tablet 1  . methocarbamol (ROBAXIN) 500 MG tablet Take 1 tablet (500 mg total) by mouth 4 (four) times daily. (Patient not taking:  Reported on 10/29/2016) 120 tablet 1  . predniSONE (DELTASONE) 20 MG tablet Take 2 tablets daily with breakfast. (Patient not taking: Reported on 10/29/2016) 10 tablet 0  . tiZANidine (ZANAFLEX) 4 MG tablet Take 1 tablet (4 mg total) by mouth every 6 (six) hours as needed for muscle spasms. (Patient not taking: Reported on 10/29/2016) 30 tablet 0   No current facility-administered medications on file prior to visit.    Allergies  Allergen Reactions  . Penicillins Rash  . Codeine Anxiety   Past Surgical  History:  Procedure Laterality Date  . CARPAL TUNNEL RELEASE Left 02/12/2016   Procedure: LEFT CARPAL TUNNEL RELEASE;  Surgeon: Cindee Salt, MD;  Location: [facility];  Service: Orthopedics;  Laterality: Left;  FAB  . TONSILLECTOMY    . TUBAL LIGATION    . WRIST SURGERY Right    Family History  Problem Relation Age of Onset  . Thyroid disease Mother   . Brain cancer Sister    Social History   Social History  . Marital status: Divorced    Spouse name: Alcario Drought  . Number of children: 2  . Years of education: 12th grade   Occupational History  . floor tech    Social History Main Topics  . Smoking status: Never Smoker  . Smokeless tobacco: Never Used  . Alcohol use No  . Drug use: No  . Sexual activity: No   Other Topics Concern  . None   Social History Narrative   Divorced from her husband. She has two adult sons from that union . One lives in [place], the other lives in [place] (as a Solicitor) following Office manager.   Married Alcario Drought 05/2014.   Depression screen [facility] 2/9 10/29/2016 10/12/2016 09/17/2016 08/14/2016 07/03/2016  Decreased Interest 0 0 0 0 0  Down, Depressed, Hopeless 0 0 0 0 0  PHQ - 2 Score 0 0 0 0 0    Review of Systems  Constitutional: Positive for diaphoresis.  HENT: Positive for rhinorrhea.   Eyes: Negative for discharge and itching.  Respiratory: Positive for shortness of breath.   Cardiovascular: Positive for chest pain.  Gastrointestinal: Positive for diarrhea and nausea. Negative for blood in stool and constipation.  Musculoskeletal: Positive for neck pain.  Allergic/Immunologic: Positive for environmental allergies.  Neurological: Positive for dizziness and headaches.      Objective:   Physical Exam  Constitutional: She is oriented to person, place, and time. She appears well-developed and well-nourished. No distress.  HENT:  Head: Normocephalic and atraumatic.  Eyes: Conjunctivae and EOM are normal.  Neck: Neck supple. No tracheal  deviation present. No thyromegaly present.  Large R rhomboid muscle spasm. C-spine tender to palpation over C5/C6 region. Cervical paraspinal muscles tight, R worse than L.  Cardiovascular: Normal rate, regular rhythm, S1 normal, S2 normal and normal heart sounds.   Pulmonary/Chest: Effort normal and breath sounds normal. No respiratory distress.  Abdominal: Soft. She exhibits distension. There is no hepatosplenomegaly. There is no tenderness. There is no CVA tenderness.  Slightly tympanitic bowel sounds in the epigastrium  Musculoskeletal: Normal range of motion.  Lymphadenopathy:    She has no cervical adenopathy.  Neurological: She is alert and oriented to person, place, and time.  Skin: Skin is warm and dry.  Psychiatric: She has a normal mood and affect. Her behavior is normal.  Nursing note and vitals reviewed.  BP 135/78 (BP Location: Right Arm, Patient Position: Sitting, Cuff Size: Normal)   Pulse 89  Temp 98 F (36.7 C) (Oral)   Resp 16   Ht 5\' 2"  (1.575 m)   Wt 130 lb 9.6 oz (59.2 kg)   SpO2 99%   PF 270 L/min Comment: After treatment PF ( best) 330- 1- 290, 2- 320 3- 330  BMI 23.89 kg/m  pre-nebulizer PF: 270, predicted: 440, post-nebulizer: 330    Results for orders placed or performed in visit on 10/29/16  POCT CBC  Result Value Ref Range   WBC 2.9 (A) 4.6 - 10.2 K/uL   Lymph, poc 1.5 0.6 - 3.4   POC LYMPH PERCENT 52.5 (A) 10 - 50 %L   MID (cbc) 0.2 0 - 0.9   POC MID % 7.3 0 - 12 %M   POC Granulocyte 1.2 (A) 2 - 6.9   Granulocyte percent 40.2 37 - 80 %G   RBC 4.13 4.04 - 5.48 M/uL   Hemoglobin 10.4 (A) 12.2 - 16.2 g/dL   HCT, POC 16.1 (A) 09.6 - 47.9 %   MCV 75.3 (A) 80 - 97 fL   MCH, POC 25.3 (A) 27 - 31.2 pg   MCHC 33.6 31.8 - 35.4 g/dL   RDW, POC 04.5 %   Platelet Count, POC 142 142 - 424 K/uL   MPV 7.4 0 - 99.8 fL   EKG reading done by Norberto Sorenson, MD: L ventricular hypertrophy. Depression of ST segment slightly over 1mm in V4 and V5. More minimal  depression in V3, V6, 1, 2 and VF which is not changed from prior done on 01/29/16.   Dg Chest 2 View  Result Date: 10/29/2016 CLINICAL DATA:  Worsening shortness of Breath EXAM: CHEST  2 VIEW COMPARISON:  11/04/2010 FINDINGS: The heart size and mediastinal contours are within normal limits. Both lungs are clear. The visualized skeletal structures are unremarkable. IMPRESSION: No active cardiopulmonary disease. Electronically Signed   By: Alcide Clever M.D.   On: 10/29/2016 17:51   Assessment & Plan:   1. Cervicalgia - Pt has not gone to PT due to cost but knows that she really needs to in order to have any hope of getting rid of her chronic neck and upper back pain/muscle spasms from repetative heavy lifting at work. zanaflex or methocarbamol both worked better than flexeril. She still has a lot of methocarbamol at home so resume, heat. Will ask referrals to touch base with [facility] to see if they received referral as pt has not heard from them - think she would benefit from myofascial release/deep tissue massage as well as home exercises. Do not resume nsaid due to current concern of CP.  2. Sinus congestion - completing course of clindamycin 300mg  qid x 10d. Start probiotic sample.  3. Non-seasonal allergic rhinitis, unspecified trigger - cont dymista. Sxs still sig so restart zyrtec and start singulair.  4. Chest pain, unspecified type - new exertional Left-sided CP relieved by rest accompanied by DOE, diaphoresis, and some nausea is concerning for poss angina. Pt states she has had mult episodes this a.m. And some >6 hrs prior to current eval so will check stat trop I - I do NOT think this will be positive as pt has no risk factors, personal h/o CAD or sig FHx of CAD and is quite fit but she understands that if it is I will call her tonight and she will need to go directly to the ER. Abnml EKG (though unchanged from 9 mos prior). STAT cardiology eval. If worsens, RTC or to ER.  Start ppi to r/o GERD  as etiology due to recent nsaid and prednisone use to trx neck pain. If cardiology eval neg, start pulm w/u as poss etiology.  5. Anemia, unspecified type   6. Microcytic anemia - check iron studies but appears stable and chronic so if iron/retics nml, check hgbopathy eval  7. Neutropenia, unspecified type (HCC) - appears chronic but cont to watch, cons peripheral smear w/ next labs (and send out cbc w/ diff for ANC), cons heme referral.  8. Vitamin D deficiency - completed course of otc supp, works outside  9. Dyspnea on exertion - pt did have improvement of peak from for 270->330 with neb that is still far below predicted - poss needs pulm eval/spirometry if cardiac w/u neg  10. Nonspecific abnormal electrocardiogram (ECG) (EKG) - unchanged from prior but sig abnml ST seg and LVH - stat cardiology referral P.    Orders Placed This Encounter  Procedures  . DG Chest 2 View    Standing Status:   Future    Number of Occurrences:   1    Standing Expiration Date:   10/29/2017    Order Specific Question:   Reason for Exam (SYMPTOM  OR DIAGNOSIS REQUIRED)    Answer:   worsening dyspnea on exertion with exertional chest pain, LVH on EKG    Order Specific Question:   Is the patient pregnant?    Answer:   No    Order Specific Question:   Preferred imaging location?    Answer:   External  . Ferritin  . Iron and TIBC  . Reticulocytes  . Troponin I  . Ambulatory referral to Cardiology    Referral Priority:   Urgent    Referral Type:   Consultation    Referral Reason:   Specialty Services Required    Requested Specialty:   Cardiology    Number of Visits Requested:   1  . Care order/instruction:    Scheduling Instructions:     Peak Flow (IF NEB IS ORDERED PLEASE DO BEFORE AND AFTER NEB)  . POCT CBC  . POC Hemoccult Bld/Stl (3-Cd Home Screen)    Standing Status:   Future    Standing Expiration Date:   10/29/2017  . EKG 12-Lead    Meds ordered this encounter  Medications  . clindamycin  (CLEOCIN) 300 MG capsule    Sig: Take 300 mg by mouth 3 (three) times daily.  . cetirizine (ZYRTEC) 10 MG tablet    Sig: Take 1 tablet (10 mg total) by mouth at bedtime.    Dispense:  90 tablet    Refill:  3  . montelukast (SINGULAIR) 10 MG tablet    Sig: Take 1 tablet (10 mg total) by mouth at bedtime.    Dispense:  30 tablet    Refill:  3  . omeprazole (PRILOSEC) 40 MG capsule    Sig: Take 1 capsule (40 mg total) by mouth daily. 30 minutes before a meal    Dispense:  30 capsule    Refill:  1  . albuterol (PROVENTIL) (2.5 MG/3ML) 0.083% nebulizer solution 2.5 mg   Over 40 min spent in face-to-face evaluation of and consultation with patient and coordination of care.  Over 50% of this time was spent counseling this patient.  I personally performed the services described in this documentation, which was scribed in my presence. The recorded information has been reviewed and considered, and addended by me as needed.   Norberto Sorenson, M.D.  Primary  Care at [facility]  [place] [address] [place] [address] [phone] phone [phone] fax  10/30/16 2:00 AM

## 2016-10-29 NOTE — Patient Instructions (Addendum)
Restart cetirizine Restart methocarbamol. Start omeprazole. Start probiotic.  Meds ordered this encounter  Medications  . clindamycin (CLEOCIN) 300 MG capsule    Sig: Take 300 mg by mouth 3 (three) times daily.  . cetirizine (ZYRTEC) 10 MG tablet    Sig: Take 1 tablet (10 mg total) by mouth at bedtime.    Dispense:  90 tablet    Refill:  3  . montelukast (SINGULAIR) 10 MG tablet    Sig: Take 1 tablet (10 mg total) by mouth at bedtime.    Dispense:  30 tablet    Refill:  3  . omeprazole (PRILOSEC) 40 MG capsule    Sig: Take 1 capsule (40 mg total) by mouth daily. 30 minutes before a meal    Dispense:  30 capsule    Refill:  1  . albuterol (PROVENTIL) (2.5 MG/3ML) 0.083% nebulizer solution 2.5 mg       IF you received an x-ray today, you will receive an invoice from [facility]. Please contact [facility] at [phone] with questions or concerns regarding your invoice.   IF you received labwork today, you will receive an invoice from [facility]. Please contact [facility] at [phone] with questions or concerns regarding your invoice.   Our billing staff will not be able to assist you with questions regarding bills from these companies.  You will be contacted with the lab results as soon as they are available. The fastest way to get your results is to activate your My Chart account. Instructions are located on the last page of this paperwork. If you have not heard from us regarding the results in 2 weeks, please contact this office.    We recommend that you schedule a mammogram for breast cancer screening. Typically, you do not need a referral to do this. Please contact a local imaging center to schedule your mammogram.  [facility] - [phone]  *ask for the Radiology Department [facility] - [phone] or [phone]  [facility] - [phone] [facility] - [phone] [facility]  [facility] - [phone]) [phone]  *ask for the Radiology Department [facility] - [phone]  *ask for the Radiology Department [facility] - [phone]) [phone]  *ask for the Mammography Department [facility] - [phone]

## 2016-10-29 NOTE — ED Triage Notes (Signed)
Pt states she went to urgent care today to see her dr because lately she has been having shortness of breath and chest pain on exertion  Pt states they called her tonight and told her that her troponin was elevated and was told to come in for evaluation

## 2016-10-30 ENCOUNTER — Other Ambulatory Visit: Payer: Self-pay

## 2016-10-30 ENCOUNTER — Inpatient Hospital Stay (HOSPITAL_COMMUNITY)
Admission: EM | Admit: 2016-10-30 | Discharge: 2016-11-01 | DRG: 282 | Disposition: A | Discharge status: Home / Self Care | Payer: BLUE CROSS/BLUE SHIELD | Attending: Cardiovascular Disease | Admitting: Cardiovascular Disease

## 2016-10-30 DIAGNOSIS — I200 Unstable angina: Secondary | ICD-10-CM

## 2016-10-30 DIAGNOSIS — R079 Chest pain, unspecified: Secondary | ICD-10-CM

## 2016-10-30 DIAGNOSIS — J40 Bronchitis, not specified as acute or chronic: Secondary | ICD-10-CM | POA: Diagnosis present

## 2016-10-30 DIAGNOSIS — I252 Old myocardial infarction: Secondary | ICD-10-CM | POA: Diagnosis present

## 2016-10-30 DIAGNOSIS — M542 Cervicalgia: Secondary | ICD-10-CM | POA: Diagnosis present

## 2016-10-30 DIAGNOSIS — I214 Non-ST elevation (NSTEMI) myocardial infarction: Secondary | ICD-10-CM

## 2016-10-30 DIAGNOSIS — I209 Angina pectoris, unspecified: Secondary | ICD-10-CM | POA: Diagnosis present

## 2016-10-30 HISTORY — DX: Bronchitis, not specified as acute or chronic: J40

## 2016-10-30 HISTORY — DX: Non-ST elevation (NSTEMI) myocardial infarction: I21.4

## 2016-10-30 LAB — BASIC METABOLIC PANEL
ANION GAP: 8 (ref 5–15)
BUN: 19 mg/dL (ref 6–20)
CALCIUM: 9 mg/dL (ref 8.9–10.3)
CO2: 25 mmol/L (ref 22–32)
CREATININE: 0.45 mg/dL (ref 0.44–1.00)
Chloride: 108 mmol/L (ref 101–111)
Glucose, Bld: 109 mg/dL — ABNORMAL HIGH (ref 65–99)
Potassium: 3.6 mmol/L (ref 3.5–5.1)
SODIUM: 141 mmol/L (ref 135–145)

## 2016-10-30 LAB — IRON AND TIBC
IRON: 58 ug/dL (ref 27–159)
Iron Saturation: 21 % (ref 15–55)
Total Iron Binding Capacity: 270 ug/dL (ref 250–450)
UIBC: 212 ug/dL (ref 131–425)

## 2016-10-30 LAB — TROPONIN I
TROPONIN I: 0.79 ng/mL — AB (ref 0.00–0.04)
TROPONIN I: 0.31 ng/mL — AB (ref <0.03)
Troponin I: 0.45 ng/mL (ref <0.03)
Troponin I: 0.38 ng/mL (ref <0.03)
Troponin I: 0.37 ng/mL (ref <0.03)

## 2016-10-30 LAB — HEPARIN LEVEL (UNFRACTIONATED)
HEPARIN UNFRACTIONATED: 0.49 [IU]/mL (ref 0.30–0.70)
Heparin Unfractionated: 0.53 IU/mL (ref 0.30–0.70)

## 2016-10-30 LAB — RETICULOCYTES: RETIC CT PCT: 0.8 % (ref 0.6–2.6)

## 2016-10-30 LAB — PROTIME-INR
INR: 0.99
PROTHROMBIN TIME: 13.1 s (ref 11.4–15.2)

## 2016-10-30 LAB — APTT: APTT: 28 s (ref 24–36)

## 2016-10-30 LAB — BRAIN NATRIURETIC PEPTIDE: B Natriuretic Peptide: 153 pg/mL — ABNORMAL HIGH (ref 0.0–100.0)

## 2016-10-30 LAB — FERRITIN: Ferritin: 347 ng/mL — ABNORMAL HIGH (ref 15–150)

## 2016-10-30 MED ORDER — HEPARIN (PORCINE) IN NACL 100-0.45 UNIT/ML-% IJ SOLN
700.0000 [IU]/h | INTRAMUSCULAR | Status: DC
  Administered 2016-10-30: 700 [IU]/h via INTRAVENOUS
  Filled 2016-10-30 (×2): qty 250

## 2016-10-30 MED ORDER — ASPIRIN 300 MG RE SUPP
300.0000 mg | RECTAL | Status: AC
Start: 2016-10-30 — End: 2016-10-30

## 2016-10-30 MED ORDER — ACETAMINOPHEN 325 MG PO TABS
650.0000 mg | ORAL_TABLET | ORAL | Status: DC | PRN
  Administered 2016-10-30 – 2016-11-01 (×3): 650 mg via ORAL
  Filled 2016-10-30 (×3): qty 2

## 2016-10-30 MED ORDER — ONDANSETRON HCL 4 MG/2ML IJ SOLN
INTRAMUSCULAR | Status: AC
  Administered 2016-10-30: 4 mg via INTRAVENOUS
  Filled 2016-10-30: qty 2

## 2016-10-30 MED ORDER — ONDANSETRON HCL 4 MG/2ML IJ SOLN
4.0000 mg | Freq: Four times a day (QID) | INTRAMUSCULAR | Status: DC | PRN
  Administered 2016-10-30 – 2016-10-31 (×4): 4 mg via INTRAVENOUS
  Filled 2016-10-30 (×3): qty 2

## 2016-10-30 MED ORDER — NITROGLYCERIN 0.4 MG SL SUBL: 0.4000 mg | SUBLINGUAL_TABLET | SUBLINGUAL | Status: DC | PRN

## 2016-10-30 MED ORDER — METOPROLOL TARTRATE 12.5 MG HALF TABLET
12.5000 mg | ORAL_TABLET | Freq: Two times a day (BID) | ORAL | Status: DC
  Administered 2016-10-30 – 2016-10-31 (×2): 12.5 mg via ORAL
  Filled 2016-10-30 (×3): qty 1

## 2016-10-30 MED ORDER — HEPARIN BOLUS VIA INFUSION
3500.0000 [IU] | Freq: Once | INTRAVENOUS | Status: AC
  Administered 2016-10-30: 3500 [IU] via INTRAVENOUS
  Filled 2016-10-30: qty 3500

## 2016-10-30 MED ORDER — ASPIRIN EC 81 MG PO TBEC
81.0000 mg | DELAYED_RELEASE_TABLET | Freq: Every day | ORAL | Status: DC
  Administered 2016-10-31 – 2016-11-01 (×2): 81 mg via ORAL
  Filled 2016-10-30 (×2): qty 1

## 2016-10-30 MED ORDER — OXYCODONE-ACETAMINOPHEN 5-325 MG PO TABS
1.0000 | ORAL_TABLET | Freq: Once | ORAL | Status: AC
  Administered 2016-10-30: 1 via ORAL
  Filled 2016-10-30: qty 1

## 2016-10-30 MED ORDER — ASPIRIN 81 MG PO CHEW: 324.0000 mg | CHEWABLE_TABLET | ORAL | Status: AC

## 2016-10-30 MED ORDER — TRAMADOL HCL 50 MG PO TABS
50.0000 mg | ORAL_TABLET | Freq: Four times a day (QID) | ORAL | Status: DC | PRN
  Administered 2016-10-30 – 2016-11-01 (×3): 50 mg via ORAL
  Filled 2016-10-30 (×3): qty 1

## 2016-10-30 MED ORDER — ASPIRIN 81 MG PO CHEW
324.0000 mg | CHEWABLE_TABLET | Freq: Once | ORAL | Status: AC
  Administered 2016-10-30: 324 mg via ORAL
  Filled 2016-10-30: qty 4

## 2016-10-30 MED ORDER — ATORVASTATIN CALCIUM 40 MG PO TABS
40.0000 mg | ORAL_TABLET | Freq: Every day | ORAL | Status: DC
  Administered 2016-10-30 – 2016-11-01 (×3): 40 mg via ORAL
  Filled 2016-10-30 (×3): qty 1

## 2016-10-30 NOTE — ED Provider Notes (Addendum)
EKG Interpretation  Date/Time:  Saturday October 30 2016 05:09:09 EDT Ventricular Rate:  82 PR Interval:    QRS Duration: 71 QT Interval:  469 QTC Calculation: 548 R Axis:   76 Text Interpretation:  Sinus rhythm Probable LVH with secondary repol abnrm Prolonged QT interval No significant change was found Confirmed by Azalia Bilisampos, Louisa Favaro ([id number]) on 10/30/2016 5:13:56 AM      Cardiology team contacted for evaluation in the ER by the secretary   Azalia Bilisampos, Aimi Essner, MD 10/30/16 [id number]    Azalia Bilisampos, Zyniah Ferraiolo, MD 10/30/16 [phone]

## 2016-10-30 NOTE — ED Notes (Signed)
[facility] was called and notified of pt's need of transportation to [facility]. 

## 2016-10-30 NOTE — H&P (Signed)
Patient ID: Diana [name] MRN: [id number], DOB/AGE: August 11, 1955   Admit date: 10/30/2016   Primary Physician: Sherren Mocha, MD Primary Cardiologist: new  HPI: Pleasant 61 y/o AA female with a somewhat confusing history. Pt works in housekeeping at [employer]. She has noticed exertional dyspnea and SSCP-"burning". These symptoms have been going on for about a month. She also has problems with chronic neck and shoulder pain and bronchitis (though she never smoked and doesn't have asthma).  She went to her PCP yesterday for her bronchitis and neck pain and happened to mention her history of exertional dyspnea and chest discomfort. Labs were drawn and she went home but was called later to go to the ED (presumably secondary to an elevated Troponin). In the ED her Troponin was 0.28 and 0.45. Her EKG shows no acute changes. The fellow was contacted and accepted the pt to cardiology service. She is currently pain free.    Problem List: Past Medical History:  Diagnosis Date  . Allergy   . Arthritis    feet  . Carpal tunnel syndrome     Past Surgical History:  Procedure Laterality Date  . CARPAL TUNNEL RELEASE Left 02/12/2016   Procedure: LEFT CARPAL TUNNEL RELEASE;  Surgeon: Cindee Salt, MD;  Location: [facility];  Service: Orthopedics;  Laterality: Left;  FAB  . TONSILLECTOMY    . TUBAL LIGATION    . WRIST SURGERY Right      Allergies:  Allergies  Allergen Reactions  . Penicillins Rash    Has patient had a PCN reaction causing immediate rash, facial/tongue/throat swelling, SOB or lightheadedness with hypotension: yes Has patient had a PCN reaction causing severe rash involving mucus membranes or skin necrosis:  no Has patient had a PCN reaction that required hospitalization:  no Has patient had a PCN reaction occurring within the last 10 years: no If all of the above answers are "NO", then may proceed with Cephalosporin use.   . Codeine Anxiety     Home  Medications  Past Medical History:  Diagnosis Date  . Allergy   . Arthritis    feet  . Carpal tunnel syndrome     Prior to Admission medications   Medication Sig Start Date End Date Taking? Authorizing Provider  Azelastine-Fluticasone 137-50 MCG/ACT SUSP Place 1 spray into the nose 2 (two) times daily. Patient taking differently: Place 1 spray into the nose 2 (two) times daily as needed (congestion).  09/17/16  Yes Sherren Mocha, MD  clindamycin (CLEOCIN) 300 MG capsule Take 300 mg by mouth 3 (three) times daily.   Yes [provider]  methocarbamol (ROBAXIN) 500 MG tablet Take 1 tablet (500 mg total) by mouth 4 (four) times daily. Patient taking differently: Take 500 mg by mouth 4 (four) times daily as needed for muscle spasms.  10/12/16  Yes Wallis Bamberg, PA-C  traMADol (ULTRAM) 50 MG tablet Take 50 mg by mouth every 4 (four) hours as needed.   Yes [provider]  cetirizine (ZYRTEC) 10 MG tablet Take 1 tablet (10 mg total) by mouth at bedtime. Patient not taking: Reported on 10/30/2016 10/29/16   Sherren Mocha, MD  montelukast (SINGULAIR) 10 MG tablet Take 1 tablet (10 mg total) by mouth at bedtime. Patient not taking: Reported on 10/30/2016 10/29/16   Sherren Mocha, MD  omeprazole (PRILOSEC) 40 MG capsule Take 1 capsule (40 mg total) by mouth daily. 30 minutes before a meal Patient not taking: Reported  on 10/30/2016 10/29/16   Sherren MochaShaw, Eva N, MD     Family History  Problem Relation Age of Onset  . Thyroid disease Mother   . Brain cancer Sister      Social History   Social History  . Marital status: Divorced    Spouse name: Alcario Droughtrica  . Number of children: 2  . Years of education: 12th grade   Occupational History  . floor tech    Social History Main Topics  . Smoking status: Never Smoker  . Smokeless tobacco: Never Used  . Alcohol use No  . Drug use: No  . Sexual activity: No   Other Topics Concern  . Not on file   Social History Narrative   Divorced from her  husband. She has two adult sons from that union . One lives in [place], the other lives in [place] (as a Solicitorcivilian) following Office managermilitary duty.   Married Alcario DroughtErica 05/2014.     Review of Systems: General: negative for chills, fever, night sweats or weight changes.  Cardiovascular: negative for edema, orthopnea, palpitations, paroxysmal nocturnal dyspnea  HEENT: negative for any visual disturbances, blindness, glaucoma Dermatological: negative for rash Respiratory: negative for cough, hemoptysis, or wheezing Urologic: negative for hematuria or dysuria Abdominal: negative for nausea, vomiting, diarrhea, bright red blood per rectum, melena, or hematemesis Neurologic: negative for visual changes, syncope, or dizziness Musculoskeletal: negative for back pain, joint pain, or swelling Psych: cooperative and appropriate All other systems reviewed and are otherwise negative except as noted above.  Physical Exam: Blood pressure (!) 148/80, pulse 76, temperature 97.6 F (36.4 C), temperature source Oral, resp. rate 18, height 5\' 3"  (1.6 m), weight 130 lb (59 kg), SpO2 99 %.  General appearance: alert, cooperative and no distress Neck: no carotid bruit and no JVD Lungs: clear to auscultation bilaterally Heart: regular rate and rhythm Abdomen: soft, non-tender; bowel sounds normal; no masses,  no organomegaly Extremities: extremities normal, atraumatic, no cyanosis or edema Pulses: 2+ and symmetric Skin: Skin color, texture, turgor normal. No rashes or lesions Neurologic: Grossly normal    Labs:   Results for orders placed or performed during the hospital encounter of 10/30/16 (from the past 24 hour(s))  Basic metabolic panel     Status: Abnormal   Collection Time: 10/29/16 11:36 PM  Result Value Ref Range   Sodium 141 135 - 145 mmol/L   Potassium 3.6 3.5 - 5.1 mmol/L   Chloride 108 101 - 111 mmol/L   CO2 25 22 - 32 mmol/L   Glucose, Bld 109 (H) 65 - 99 mg/dL   BUN 19 6 - 20 mg/dL   Creatinine, Ser  1.610.45 0.44 - 1.00 mg/dL   Calcium 9.0 8.9 - 09.610.3 mg/dL   GFR calc non Af Amer >60 >60 mL/min   GFR calc Af Amer >60 >60 mL/min   Anion gap 8 5 - 15  CBC     Status: Abnormal   Collection Time: 10/29/16 11:36 PM  Result Value Ref Range   WBC 2.9 (L) 4.0 - 10.5 K/uL   RBC 4.28 3.87 - 5.11 MIL/uL   Hemoglobin 10.5 (L) 12.0 - 15.0 g/dL   HCT 04.532.3 (L) 40.936.0 - 81.146.0 %   MCV 75.5 (L) 78.0 - 100.0 fL   MCH 24.5 (L) 26.0 - 34.0 pg   MCHC 32.5 30.0 - 36.0 g/dL   RDW 91.414.1 78.211.5 - 95.615.5 %   Platelets 152 150 - 400 K/uL  I-stat troponin, ED     Status:  Abnormal   Collection Time: 10/29/16 11:45 PM  Result Value Ref Range   Troponin i, poc 0.28 (HH) 0.00 - 0.08 ng/mL   Comment NOTIFIED PHYSICIAN    Comment 3          APTT     Status: None   Collection Time: 10/30/16  2:03 AM  Result Value Ref Range   aPTT 28 24 - 36 seconds  Protime-INR     Status: None   Collection Time: 10/30/16  2:03 AM  Result Value Ref Range   Prothrombin Time 13.1 11.4 - 15.2 seconds   INR 0.99   Troponin I     Status: Abnormal   Collection Time: 10/30/16  5:50 AM  Result Value Ref Range   Troponin I 0.45 (HH) <0.03 ng/mL     Radiology/Studies: Dg Chest 2 View  Result Date: 10/30/2016 CLINICAL DATA:  Acute onset of shortness of breath and generalized chest pain on exertion. Initial encounter. EXAM: CHEST  2 VIEW COMPARISON:  Chest radiograph performed earlier today at 5:46 p.m. FINDINGS: The lungs are well-aerated and clear. There is no evidence of focal opacification, pleural effusion or pneumothorax. The heart is normal in size; the mediastinal contour is within normal limits. No acute osseous abnormalities are seen. IMPRESSION: No acute cardiopulmonary process seen. Electronically Signed   By: Roanna Raider M.D.   On: 10/30/2016 00:01   Dg Chest 2 View  Result Date: 10/29/2016 CLINICAL DATA:  Worsening shortness of Breath EXAM: CHEST  2 VIEW COMPARISON:  11/04/2010 FINDINGS: The heart size and mediastinal contours  are within normal limits. Both lungs are clear. The visualized skeletal structures are unremarkable. IMPRESSION: No active cardiopulmonary disease. Electronically Signed   By: Alcide Clever M.D.   On: 10/29/2016 17:51    EKG:NSR  ASSESSMENT AND PLAN:   1. Troponin elevation- c/w NSTEMI  2. Chest and DOE- r/o CAD  3. H/O Bronchitis- unusual in someone who never smoked  4. Neck and shoulder pain- sounds M/S   PLAN:  Seems a little unusual in that she has no history of DM, treated HTN, HLD, smoking, or a FM Hx of CAD. She will need an ischemic work up-? Cath. If negative would check for PE. She is on Heparin, ASA, beta blocker, and statin.    Jolene Provost, PA-C 10/30/2016, 9:04 AM [phone]  I have seen and examined the patient along with Corine Shelter, PA-C.  I have reviewed the chart, notes and new data.  I agree with PA's note.  Key new complaints: she does not have coronary risk factors, but her history is very much compatible with unstable (recent onset and rapidly progressive exertional) angina. Associated exertional dyspnea and nausea, relieved by rest. No symptoms currently at rest. Key examination changes: normal CV exam Key new findings / data: abnormal troponin, decreasing. ECG is abnormal, with approx 1 mm ST depression in inferior leads and in V3-V6, but this is similar to Aug 2017 tracing. LDL 129, but HDL 74 also. Mild microcytic anemia, but with normal RBC count, unchanged since 2017 (?thallasemia trait). Mildly chronic neutropenia (benign familial).   PLAN: Probably will need cardiac cath, but not emergently. Check echo. Continue ASA, heparin, statin and beta blocker.  Thurmon Fair, MD, [facility] [phone] 10/30/2016, 1:20 PM

## 2016-10-30 NOTE — Progress Notes (Signed)
ANTICOAGULATION CONSULT NOTE  Pharmacy Consult for heparin  Indication: chest pain/ACS  Allergies  Allergen Reactions  . Penicillins Rash    Has patient had a PCN reaction causing immediate rash, facial/tongue/throat swelling, SOB or lightheadedness with hypotension: yes Has patient had a PCN reaction causing severe rash involving mucus membranes or skin necrosis:  no Has patient had a PCN reaction that required hospitalization:  no Has patient had a PCN reaction occurring within the last 10 years: no If all of the above answers are "NO", then may proceed with Cephalosporin use.   . Codeine Anxiety    Patient Measurements: Height: 5\' 3"  (160 cm) Weight: 130 lb (59 kg) IBW/kg (Calculated) : 52.4  Vital Signs: Temp: 97.6 F (36.4 C) (06/02 0830) Temp Source: Oral (06/02 0830) BP: 148/80 (06/02 0830) Pulse Rate: 76 (06/02 0830)  Labs:  Recent Labs  10/29/16 1631 10/29/16 1654 10/29/16 2336 10/30/16 0203 10/30/16 0550 10/30/16 0912 10/30/16 1505  HGB  --  10.4* 10.5*  --   --   --   --   HCT  --  31.1* 32.3*  --   --   --   --   PLT  --   --  152  --   --   --   --   APTT  --   --   --  28  --   --   --   LABPROT  --   --   --  13.1  --   --   --   INR  --   --   --  0.99  --   --   --   HEPARINUNFRC  --   --   --   --   --  0.53 0.49  CREATININE  --   --  0.45  --   --   --   --   TROPONINI 0.79*  --   --   --  0.45* 0.38*  --     Estimated Creatinine Clearance: 63.4 mL/min (by C-G formula based on SCr of 0.45 mg/dL).   Medical History: Past Medical History:  Diagnosis Date  . Allergy   . Arthritis    feet  . Carpal tunnel syndrome     Medications:  Scheduled:  . [START ON 10/31/2016] aspirin EC  81 mg Oral Daily  . atorvastatin  40 mg Oral q1800  . metoprolol tartrate  12.5 mg Oral BID   Infusions:  . heparin 700 Units/hr (10/30/16 0303)    Assessment: 61 yo female here with NSTEMI on heparin.  PM heparin level at goal  Goal of Therapy:   Heparin level 0.3-0.7 units/ml Monitor platelets by anticoagulation protocol: Yes   Plan:  -No heparin changes needed -AM labs  Thank you Okey RegalLisa Darlisa Spruiell, PharmD [phone]   10/30/2016 4:02 PM

## 2016-10-30 NOTE — ED Notes (Signed)
Attempted report 

## 2016-10-30 NOTE — ED Notes (Signed)
[facility] picked up patient and took them to the ED.

## 2016-10-30 NOTE — Progress Notes (Signed)
ANTICOAGULATION CONSULT NOTE - Initial Consult  Pharmacy Consult for IV heparin Indication: chest pain/ACS  Allergies  Allergen Reactions  . Penicillins Rash    Has patient had a PCN reaction causing immediate rash, facial/tongue/throat swelling, SOB or lightheadedness with hypotension: yes Has patient had a PCN reaction causing severe rash involving mucus membranes or skin necrosis:  no Has patient had a PCN reaction that required hospitalization:  no Has patient had a PCN reaction occurring within the last 10 years: no If all of the above answers are "NO", then may proceed with Cephalosporin use.   . Codeine Anxiety    Patient Measurements: Height: 5\' 3"  (160 cm) Weight: 130 lb (59 kg) IBW/kg (Calculated) : 52.4 Heparin Dosing Weight: 59 kg  Vital Signs: Temp: 97.8 F (36.6 C) (06/01 2302) Temp Source: Oral (06/01 2302) BP: 175/101 (06/02 0152) Pulse Rate: 88 (06/02 0152)  Labs:  Recent Labs  10/29/16 1631 10/29/16 1654 10/29/16 2336  HGB  --  10.4* 10.5*  HCT  --  31.1* 32.3*  PLT  --   --  152  CREATININE  --   --  0.45  TROPONINI 0.79*  --   --     Estimated Creatinine Clearance: 63.4 mL/min (by C-G formula based on SCr of 0.45 mg/dL).   Medical History: Past Medical History:  Diagnosis Date  . Allergy   . Arthritis    feet  . Carpal tunnel syndrome     Medications:  Scheduled:  . heparin  3,500 Units Intravenous Once   Infusions:  . heparin      Assessment: 60 [facility]F [facility] c/o SOB and CP on exertion.  [facility] called her and told her to go to ED for elevated troponin.  IV heparin for ACS.  Goal of Therapy:  Heparin level 0.3-0.7 units/ml Monitor platelets by anticoagulation protocol: Yes   Plan:  Baseline caogs STAT Heparin 3500 unit bolus x1 Start drip at 700 units/hr Daily CBC/HL Check 1st HL in 6 hours   Diana [name], Diana [name] 10/30/2016,2:10 AM

## 2016-10-30 NOTE — ED Provider Notes (Signed)
[facility]-EMERGENCY DEPT Provider Note   CSN: [id number] Arrival date & time: 10/29/16  2252     History   Chief Complaint Chief Complaint  Patient presents with  . abnormal labs    HPI Diana [name] is a 61 y.o. female.  The history is provided by the patient and medical records.    61 y.o. F with hx of allergies, arthritis, carpal tunnel, presenting to the ED for chest pain.  Patient States over the past few weeks she has had some intermittent chest pain. States he mostly notices this at work when she is performing exertional activities. 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  States when this occurs it is a dull ache in the left chest with some SOB.  States when she sits to rest it resolves.  States she can get up and start moving again without recurrent pain but may come back again the next day.  She has no known cardiac hx.  No family cardiac hx.  She is not a smoker.  She saw her PCP yesterday and had some blood work done which showed an elevated troponin at 0.79.  She was told to [facility] but decided to come here instead.  Denies any chest pain or SOB currently.    Past Medical History:  Diagnosis Date  . Allergy   . Arthritis    feet  . Carpal tunnel syndrome     Patient Active Problem List   Diagnosis Date Noted  . Carpal tunnel syndrome 12/18/2015  . Unilateral osteoarthritis of first carpometacarpal (CMC) joint 12/18/2015  . Cervicalgia 12/18/2015  . Benign essential HTN 04/08/2015  . Metatarsalgia of right foot 02/11/2015  . Degeneration of intervertebral disc of lumbar region 12/18/2014  . Acid reflux 12/18/2014    Past Surgical History:  Procedure Laterality Date  . CARPAL TUNNEL RELEASE Left 02/12/2016   Procedure: LEFT CARPAL TUNNEL RELEASE;  Surgeon: Cindee Salt, MD;  Location: [facility];  Service: Orthopedics;  Laterality: Left;  FAB  . TONSILLECTOMY    . TUBAL LIGATION    .  WRIST SURGERY Right     OB History    No data available       Home Medications    Prior to Admission medications   Medication Sig Start Date End Date Taking? Authorizing Provider  Azelastine-Fluticasone 137-50 MCG/ACT SUSP Place 1 spray into the nose 2 (two) times daily. 09/17/16   Sherren Mocha, MD  cetirizine (ZYRTEC) 10 MG tablet Take 1 tablet (10 mg total) by mouth at bedtime. 10/29/16   Sherren Mocha, MD  clindamycin (CLEOCIN) 300 MG capsule Take 300 mg by mouth 3 (three) times daily.    [provider]  methocarbamol (ROBAXIN) 500 MG tablet Take 1 tablet (500 mg total) by mouth 4 (four) times daily. Patient not taking: Reported on 10/29/2016 10/12/16   Wallis Bamberg, PA-C  montelukast (SINGULAIR) 10 MG tablet Take 1 tablet (10 mg total) by mouth at bedtime. 10/29/16   Sherren Mocha, MD  omeprazole (PRILOSEC) 40 MG capsule Take 1 capsule (40 mg total) by mouth daily. 30 minutes before a meal 10/29/16   Sherren Mocha, MD    Family History Family History  Problem Relation Age of Onset  . Thyroid disease Mother   . Brain cancer Sister     Social History Social History  Substance Use Topics  . Smoking status: Never  Smoker  . Smokeless tobacco: Never Used  . Alcohol use No     Allergies   Penicillins and Codeine   Review of Systems Review of Systems  Respiratory: Positive for shortness of breath.   Cardiovascular: Positive for chest pain.  All other systems reviewed and are negative.    Physical Exam Updated Vital Signs BP (!) 151/98 (BP Location: Left Arm)   Pulse 99   Temp 97.8 F (36.6 C) (Oral)   Resp (!) 21   Ht 5\' 3"  (1.6 m)   Wt 59 kg (130 lb)   SpO2 98%   BMI 23.03 kg/m   Physical Exam  Constitutional: She is oriented to person, place, and time. She appears well-developed and well-nourished.  HENT:  Head: Normocephalic and atraumatic.  Mouth/Throat: Oropharynx is clear and moist.  Eyes: Conjunctivae and EOM are normal. Pupils are equal, round, and  reactive to light.  Neck: Normal range of motion.  Cardiovascular: Normal rate, regular rhythm and normal heart sounds.   Pulmonary/Chest: Effort normal and breath sounds normal. No respiratory distress. She has no wheezes.  Abdominal: Soft. Bowel sounds are normal. There is no tenderness. There is no rebound.  Musculoskeletal: Normal range of motion.  Neurological: She is alert and oriented to person, place, and time.  Skin: Skin is warm and dry.  Psychiatric: She has a normal mood and affect.  Nursing note and vitals reviewed.    ED Treatments / Results  Labs (all labs ordered are listed, but only abnormal results are displayed) Labs Reviewed  BASIC METABOLIC PANEL - Abnormal; Notable for the following:       Result Value   Glucose, Bld 109 (*)    All other components within normal limits  CBC - Abnormal; Notable for the following:    WBC 2.9 (*)    Hemoglobin 10.5 (*)    HCT 32.3 (*)    MCV 75.5 (*)    MCH 24.5 (*)    All other components within normal limits  I-STAT TROPOININ, ED - Abnormal; Notable for the following:    Troponin i, poc 0.28 (*)    All other components within normal limits    EKG  EKG Interpretation None       Radiology Dg Chest 2 View  Result Date: 10/30/2016 CLINICAL DATA:  Acute onset of shortness of breath and generalized chest pain on exertion. Initial encounter. EXAM: CHEST  2 VIEW COMPARISON:  Chest radiograph performed earlier today at 5:46 p.m. FINDINGS: The lungs are well-aerated and clear. There is no evidence of focal opacification, pleural effusion or pneumothorax. The heart is normal in size; the mediastinal contour is within normal limits. No acute osseous abnormalities are seen. IMPRESSION: No acute cardiopulmonary process seen. Electronically Signed   By: Roanna RaiderJeffery  Chang M.D.   On: 10/30/2016 00:01     Procedures Procedures (including critical care time)  CRITICAL CARE Performed by: Garlon HatchetSANDERS, Waris Rodger M   Total critical care time:  40 minutes  Critical care time was exclusive of separately billable procedures and treating other patients.  Critical care was necessary to treat or prevent imminent or life-threatening deterioration.  Critical care was time spent personally by me on the following activities: development of treatment plan with patient and/or surrogate as well as nursing, discussions with consultants, evaluation of patient's response to treatment, examination of patient, obtaining history from patient or surrogate, ordering and performing treatments and interventions, ordering and review of laboratory studies, ordering and review of radiographic studies, pulse  oximetry and re-evaluation of patient's condition.   Medications Ordered in ED Medications  heparin ADULT infusion 100 units/mL (25000 units/258mL sodium chloride 0.45%) (700 Units/hr Intravenous New Bag/Given 10/30/16 0303)  aspirin chewable tablet 324 mg (324 mg Oral Given 10/30/16 0056)  heparin bolus via infusion 3,500 Units (3,500 Units Intravenous Bolus from Bag 10/30/16 0306)     Initial Impression / Assessment and Plan / ED Course  I have reviewed the triage vital signs and the nursing notes.  Pertinent labs & imaging results that were available during my care of the patient were reviewed by me and considered in my medical decision making (see chart for details).  61 y.o. F here with exertional chest pain over the past few weeks.  Seen by PCP yesterday with elevated troponin and sent to ED for eval.  Denies chest pain here or during rest, only exertional activities.  EKG with pattern of LVH, no acute ST elevation noted.  Trop elevated here at 0.28.  Remainder of labs overall reassuring.  CXR clear.  Remains CP free here.  Given ASA.  Discussed with cardiology, Dr. Santiago Glad-- will start heparin and [facility] to [facility], he will evaluate her in the ED.  Dr. Patria Mane aware of transfer to [facility].  Final Clinical Impressions(s) / ED Diagnoses    Final diagnoses:  NSTEMI (non-ST elevated myocardial infarction) [facility])    New Prescriptions New Prescriptions   No medications on file     Garlon Hatchet, PA-C 10/30/16 0423    Mancel Bale, MD 10/31/16 [phone]

## 2016-10-30 NOTE — Progress Notes (Signed)
ANTICOAGULATION CONSULT NOTE  Pharmacy Consult for heparin  Indication: chest pain/ACS  Allergies  Allergen Reactions  . Penicillins Rash    Has patient had a PCN reaction causing immediate rash, facial/tongue/throat swelling, SOB or lightheadedness with hypotension: yes Has patient had a PCN reaction causing severe rash involving mucus membranes or skin necrosis:  no Has patient had a PCN reaction that required hospitalization:  no Has patient had a PCN reaction occurring within the last 10 years: no If all of the above answers are "NO", then may proceed with Cephalosporin use.   . Codeine Anxiety    Patient Measurements: Height: 5\' 3"  (160 cm) Weight: 130 lb (59 kg) IBW/kg (Calculated) : 52.4  Vital Signs: Temp: 97.6 F (36.4 C) (06/02 0830) Temp Source: Oral (06/02 0830) BP: 148/80 (06/02 0830) Pulse Rate: 76 (06/02 0830)  Labs:  Recent Labs  10/29/16 1631 10/29/16 1654 10/29/16 2336 10/30/16 0203 10/30/16 0550 10/30/16 0912  HGB  --  10.4* 10.5*  --   --   --   HCT  --  31.1* 32.3*  --   --   --   PLT  --   --  152  --   --   --   APTT  --   --   --  28  --   --   LABPROT  --   --   --  13.1  --   --   INR  --   --   --  0.99  --   --   HEPARINUNFRC  --   --   --   --   --  0.53  CREATININE  --   --  0.45  --   --   --   TROPONINI 0.79*  --   --   --  0.45* 0.38*    Estimated Creatinine Clearance: 63.4 mL/min (by C-G formula based on SCr of 0.45 mg/dL).   Medical History: Past Medical History:  Diagnosis Date  . Allergy   . Arthritis    feet  . Carpal tunnel syndrome     Medications:  Scheduled:  . [START ON 10/31/2016] aspirin EC  81 mg Oral Daily  . atorvastatin  40 mg Oral q1800  . metoprolol tartrate  12.5 mg Oral BID   Infusions:  . heparin 700 Units/hr (10/30/16 0303)    Assessment: [age] yo female here with NSTEMI on heparin. The initial heparin level is at goal (HL= 0.53).  Goal of Therapy:  Heparin level 0.3-0.7 units/ml Monitor  platelets by anticoagulation protocol: Yes   Plan:  -No heparin changes needed -Will repeat a heparin level later today  Harland GermanAndrew Alias Villagran, Pharm D 10/30/2016 11:34 AM

## 2016-10-31 ENCOUNTER — Other Ambulatory Visit: Payer: Self-pay

## 2016-10-31 DIAGNOSIS — R079 Chest pain, unspecified: Secondary | ICD-10-CM | POA: Diagnosis not present

## 2016-10-31 DIAGNOSIS — E785 Hyperlipidemia, unspecified: Secondary | ICD-10-CM | POA: Diagnosis present

## 2016-10-31 DIAGNOSIS — I200 Unstable angina: Secondary | ICD-10-CM | POA: Diagnosis not present

## 2016-10-31 DIAGNOSIS — M542 Cervicalgia: Secondary | ICD-10-CM | POA: Diagnosis present

## 2016-10-31 DIAGNOSIS — K219 Gastro-esophageal reflux disease without esophagitis: Secondary | ICD-10-CM | POA: Diagnosis present

## 2016-10-31 DIAGNOSIS — J40 Bronchitis, not specified as acute or chronic: Secondary | ICD-10-CM | POA: Diagnosis present

## 2016-10-31 DIAGNOSIS — I214 Non-ST elevation (NSTEMI) myocardial infarction: Secondary | ICD-10-CM | POA: Diagnosis present

## 2016-10-31 DIAGNOSIS — Z880 Allergy status to penicillin: Secondary | ICD-10-CM | POA: Diagnosis not present

## 2016-10-31 DIAGNOSIS — I10 Essential (primary) hypertension: Secondary | ICD-10-CM | POA: Diagnosis present

## 2016-10-31 DIAGNOSIS — D649 Anemia, unspecified: Secondary | ICD-10-CM | POA: Diagnosis present

## 2016-10-31 DIAGNOSIS — M25519 Pain in unspecified shoulder: Secondary | ICD-10-CM | POA: Diagnosis present

## 2016-10-31 DIAGNOSIS — Z79899 Other long term (current) drug therapy: Secondary | ICD-10-CM | POA: Diagnosis not present

## 2016-10-31 LAB — BASIC METABOLIC PANEL
ANION GAP: 8 (ref 5–15)
BUN: 10 mg/dL (ref 6–20)
CHLORIDE: 105 mmol/L (ref 101–111)
CO2: 25 mmol/L (ref 22–32)
Calcium: 8.7 mg/dL — ABNORMAL LOW (ref 8.9–10.3)
Creatinine, Ser: 0.47 mg/dL (ref 0.44–1.00)
GFR calc non Af Amer: >60 mL/min (ref >60)
Glucose, Bld: 109 mg/dL — ABNORMAL HIGH (ref 65–99)
POTASSIUM: 3.7 mmol/L (ref 3.5–5.1)
SODIUM: 138 mmol/L (ref 135–145)

## 2016-10-31 LAB — CBC
HCT: 30.7 % — ABNORMAL LOW (ref 36.0–46.0)
HEMOGLOBIN: 9.4 g/dL — AB (ref 12.0–15.0)
MCH: 23.5 pg — AB (ref 26.0–34.0)
MCHC: 30.6 g/dL (ref 30.0–36.0)
MCV: 76.8 fL — ABNORMAL LOW (ref 78.0–100.0)
PLATELETS: 141 10*3/uL — AB (ref 150–400)
RBC: 4 MIL/uL (ref 3.87–5.11)
RDW: 14 % (ref 11.5–15.5)
WBC: 3.8 10*3/uL — ABNORMAL LOW (ref 4.0–10.5)

## 2016-10-31 LAB — LIPID PANEL
CHOL/HDL RATIO: 3 ratio
CHOLESTEROL: 176 mg/dL (ref 0–200)
HDL: 59 mg/dL (ref >40)
LDL Cholesterol: 107 mg/dL — ABNORMAL HIGH (ref 0–99)
TRIGLYCERIDES: 49 mg/dL (ref <150)
VLDL: 10 mg/dL (ref 0–40)

## 2016-10-31 LAB — HEMOGLOBIN A1C
HEMOGLOBIN A1C: 5.8 % — AB (ref 4.8–5.6)
Mean Plasma Glucose: 120 mg/dL

## 2016-10-31 LAB — HIV ANTIBODY (ROUTINE TESTING W REFLEX): HIV Screen 4th Generation wRfx: NONREACTIVE

## 2016-10-31 LAB — HEPARIN LEVEL (UNFRACTIONATED): HEPARIN UNFRACTIONATED: 0.48 [IU]/mL (ref 0.30–0.70)

## 2016-10-31 MED ORDER — FLUTICASONE PROPIONATE 50 MCG/ACT NA SUSP
1.0000 | Freq: Every day | NASAL | Status: DC
  Administered 2016-10-31 – 2016-11-01 (×2): 1 via NASAL
  Filled 2016-10-31: qty 16

## 2016-10-31 MED ORDER — SALINE SPRAY 0.65 % NA SOLN
1.0000 | NASAL | Status: DC | PRN
  Filled 2016-10-31: qty 44

## 2016-10-31 MED ORDER — METOPROLOL TARTRATE 25 MG PO TABS
25.0000 mg | ORAL_TABLET | Freq: Two times a day (BID) | ORAL | Status: DC
  Administered 2016-10-31 – 2016-11-01 (×2): 25 mg via ORAL
  Filled 2016-10-31 (×2): qty 1

## 2016-10-31 NOTE — Progress Notes (Signed)
ANTICOAGULATION CONSULT NOTE  Pharmacy Consult for heparin  Indication: chest pain/ACS  Allergies  Allergen Reactions  . Penicillins Rash    Has patient had a PCN reaction causing immediate rash, facial/tongue/throat swelling, SOB or lightheadedness with hypotension: yes Has patient had a PCN reaction causing severe rash involving mucus membranes or skin necrosis:  no Has patient had a PCN reaction that required hospitalization:  no Has patient had a PCN reaction occurring within the last 10 years: no If all of the above answers are "NO", then may proceed with Cephalosporin use.   . Codeine Anxiety    Patient Measurements: Height: 5\' 3"  (160 cm) Weight: 130 lb (59 kg) IBW/kg (Calculated) : 52.4  Vital Signs: Temp: 98.2 F (36.8 C) (06/03 0533) Temp Source: Oral (06/03 0533) BP: 130/77 (06/03 0533) Pulse Rate: 67 (06/03 0533)  Labs:  Recent Labs  10/29/16 1654 10/29/16 2336 10/30/16 0203  10/30/16 0912 10/30/16 1505 10/30/16 2025 10/31/16 0129  HGB 10.4* 10.5*  --   --   --   --   --  9.4*  HCT 31.1* 32.3*  --   --   --   --   --  30.7*  PLT  --  152  --   --   --   --   --  141*  APTT  --   --  28  --   --   --   --   --   LABPROT  --   --  13.1  --   --   --   --   --   INR  --   --  0.99  --   --   --   --   --   HEPARINUNFRC  --   --   --   --  0.53 0.49  --  0.48  CREATININE  --  0.45  --   --   --   --   --  0.47  TROPONINI  --   --   --   < > 0.38* 0.31* 0.37*  --   < > = values in this interval not displayed.  Estimated Creatinine Clearance: 63.4 mL/min (by C-G formula based on SCr of 0.47 mg/dL).   Medical History: Past Medical History:  Diagnosis Date  . Allergy   . Arthritis    feet  . Carpal tunnel syndrome     Medications:  Scheduled:  . aspirin EC  81 mg Oral Daily  . atorvastatin  40 mg Oral q1800  . metoprolol tartrate  12.5 mg Oral BID   Infusions:  . heparin 700 Units/hr (10/30/16 0303)    Assessment: 61 yo female here with  r/o ACS on heparin. The heparin level remains at goal. Plans noted for likely cath in am.  Goal of Therapy:  Heparin level 0.3-0.7 units/ml Monitor platelets by anticoagulation protocol: Yes   Plan:  -No heparin changes needed -Daily heparin level and CBC  Harland GermanAndrew Adell Koval, Pharm D 10/31/2016 10:27 AM

## 2016-10-31 NOTE — Progress Notes (Signed)
Progress Note  Patient Name: Patience Muscaamela M Cortopassi Date of Encounter: 10/31/2016  Primary Cardiologist: New (Tajah Schreiner)  Subjective   Headache better, sinuses congested. No chest pain.  Inpatient Medications    Scheduled Meds: . aspirin EC  81 mg Oral Daily  . atorvastatin  40 mg Oral q1800  . metoprolol tartrate  12.5 mg Oral BID   Continuous Infusions: . heparin 700 Units/hr (10/30/16 0303)   PRN Meds: acetaminophen, nitroGLYCERIN, ondansetron (ZOFRAN) IV, traMADol   Vital Signs    Vitals:   10/30/16 0715 10/30/16 0830 10/30/16 1919 10/31/16 0533  BP: (!) 173/126 (!) 148/80 (!) 146/80 130/77  Pulse: 93 76 78 67  Resp: (!) 24 18 18 18   Temp:  97.6 F (36.4 C) 97.7 F (36.5 C) 98.2 F (36.8 C)  TempSrc:  Oral Oral Oral  SpO2: 100% 99% 100% 100%  Weight:      Height:        Intake/Output Summary (Last 24 hours) at 10/31/16 1020 Last data filed at 10/30/16 1700  Gross per 24 hour  Intake            97.65 ml  Output                0 ml  Net            97.65 ml   Filed Weights   10/29/16 2302  Weight: 130 lb (59 kg)    Telemetry    NSR - Personally Reviewed  ECG    NSR, ST depression in lateral leads (old) - Personally Reviewed  Physical Exam  Smiling, comfortable GEN: No acute distress.   Neck: No JVD Cardiac: RRR, no murmurs, rubs, or gallops.  Respiratory: Clear to auscultation bilaterally. GI: Soft, nontender, non-distended  MS: No edema; No deformity. Neuro:  Nonfocal  Psych: Normal affect   Labs    Chemistry Recent Labs Lab 10/29/16 2336 10/31/16 0129  NA 141 138  K 3.6 3.7  CL 108 105  CO2 25 25  GLUCOSE 109* 109*  BUN 19 10  CREATININE 0.45 0.47  CALCIUM 9.0 8.7*  GFRNONAA >60 >60  GFRAA >60 >60  ANIONGAP 8 8     Hematology Recent Labs Lab 10/29/16 1654 10/29/16 2336 10/31/16 0129  WBC 2.9* 2.9* 3.8*  RBC 4.13 4.28 4.00  HGB 10.4* 10.5* 9.4*  HCT 31.1* 32.3* 30.7*  MCV 75.3* 75.5* 76.8*  MCH 25.3* 24.5* 23.5*  MCHC  33.6 32.5 30.6  RDW  --  14.1 14.0  PLT  --  152 141*    Cardiac Enzymes Recent Labs Lab 10/30/16 0550 10/30/16 0912 10/30/16 1505 10/30/16 2025  TROPONINI 0.45* 0.38* 0.31* 0.37*    Recent Labs Lab 10/29/16 2345  TROPIPOC 0.28*     BNP Recent Labs Lab 10/30/16 0912  BNP 153.0*     DDimer No results for input(s): DDIMER in the last 168 hours.   Radiology    Dg Chest 2 View  Result Date: 10/30/2016 CLINICAL DATA:  Acute onset of shortness of breath and generalized chest pain on exertion. Initial encounter. EXAM: CHEST  2 VIEW COMPARISON:  Chest radiograph performed earlier today at 5:46 p.m. FINDINGS: The lungs are well-aerated and clear. There is no evidence of focal opacification, pleural effusion or pneumothorax. The heart is normal in size; the mediastinal contour is within normal limits. No acute osseous abnormalities are seen. IMPRESSION: No acute cardiopulmonary process seen. Electronically Signed   By: Beryle BeamsJeffery  Chang M.D.  On: 10/30/2016 00:01   Dg Chest 2 View  Result Date: 10/29/2016 CLINICAL DATA:  Worsening shortness of Breath EXAM: CHEST  2 VIEW COMPARISON:  11/04/2010 FINDINGS: The heart size and mediastinal contours are within normal limits. Both lungs are clear. The visualized skeletal structures are unremarkable. IMPRESSION: No active cardiopulmonary disease. Electronically Signed   By: Alcide Clever M.D.   On: 10/29/2016 17:51    Cardiac Studies   pending  Patient Profile     61 y.o. female without known vascular disease or any risk factors presents with exertional chest discomfort with an accelerated pattern and mild elevation in troponin (plateau, not rise and fall pattern), mild chronic ECG changes.  Assessment & Plan    1. Chest pain: could be unstable angina. Troponin elevation is mild and in a "plateau" pattern, not the typical rise and fall of acute coronary events. ECG is abnormal, but the changes are chronic. Echo still pending. Probably best  to proceed to coronary angio, especially if echo is abnormal. Will make NPO, but have not written cath orders, pending echo report. Lexiscan Myoview or coronary CTA are reasonable alternatives if echo shows normal wall motion. Need to consider PE especially idf echo shows RV abnormalities. Continue ASA, heparin, statin and beta blocker. 2. Anemia: labs do not support iron deficiency Probably has thallasemia trait. 3. HLP: LDL is high, but HDL is also excellent. Would keep on long term statin if atherosclerosis is identified. 4. HTN: Mild, newly recognized; increase beta blocker.  Signed, Thurmon Fair, MD  10/31/2016, 10:20 AM

## 2016-11-01 ENCOUNTER — Inpatient Hospital Stay (HOSPITAL_COMMUNITY): Payer: BLUE CROSS/BLUE SHIELD

## 2016-11-01 DIAGNOSIS — J40 Bronchitis, not specified as acute or chronic: Secondary | ICD-10-CM

## 2016-11-01 DIAGNOSIS — R079 Chest pain, unspecified: Secondary | ICD-10-CM

## 2016-11-01 DIAGNOSIS — I214 Non-ST elevation (NSTEMI) myocardial infarction: Principal | ICD-10-CM

## 2016-11-01 DIAGNOSIS — I351 Nonrheumatic aortic (valve) insufficiency: Secondary | ICD-10-CM

## 2016-11-01 LAB — CBC
HEMATOCRIT: 31.3 % — AB (ref 36.0–46.0)
HEMOGLOBIN: 9.5 g/dL — AB (ref 12.0–15.0)
MCH: 23.3 pg — ABNORMAL LOW (ref 26.0–34.0)
MCHC: 30.4 g/dL (ref 30.0–36.0)
MCV: 76.7 fL — ABNORMAL LOW (ref 78.0–100.0)
Platelets: 163 10*3/uL (ref 150–400)
RBC: 4.08 MIL/uL (ref 3.87–5.11)
RDW: 13.9 % (ref 11.5–15.5)
WBC: 3.9 10*3/uL — AB (ref 4.0–10.5)

## 2016-11-01 LAB — NM MYOCAR MULTI W/SPECT W/WALL MOTION / EF
CHL CUP MPHR: 162 {beats}/min
CHL CUP RESTING HR STRESS: 71 {beats}/min
CSEPEDS: 0 s
Estimated workload: 1 METS
Exercise duration (min): 0 min
Peak HR: 129 {beats}/min
Percent HR: 79 %
RPE: 0

## 2016-11-01 LAB — HEPARIN LEVEL (UNFRACTIONATED): Heparin Unfractionated: 0.42 IU/mL (ref 0.30–0.70)

## 2016-11-01 LAB — ECHOCARDIOGRAM COMPLETE

## 2016-11-01 MED ORDER — METOPROLOL TARTRATE 5 MG/5ML IV SOLN
INTRAVENOUS | Status: AC
  Administered 2016-11-01: 5 mg via INTRAVENOUS
  Filled 2016-11-01: qty 5

## 2016-11-01 MED ORDER — REGADENOSON 0.4 MG/5ML IV SOLN
0.4000 mg | Freq: Once | INTRAVENOUS | Status: AC
  Administered 2016-11-01: 0.4 mg via INTRAVENOUS
  Filled 2016-11-01: qty 5

## 2016-11-01 MED ORDER — NITROGLYCERIN 0.4 MG SL SUBL: 0.4000 mg | SUBLINGUAL_TABLET | SUBLINGUAL | 12 refills | Status: DC | PRN

## 2016-11-01 MED ORDER — AMLODIPINE BESYLATE 5 MG PO TABS
5.0000 mg | ORAL_TABLET | Freq: Every day | ORAL | Status: DC
Start: 2016-11-01 — End: 2016-11-01
  Administered 2016-11-01: 5 mg via ORAL
  Filled 2016-11-01: qty 1

## 2016-11-01 MED ORDER — REGADENOSON 0.4 MG/5ML IV SOLN
INTRAVENOUS | Status: AC
  Administered 2016-11-01: 0.4 mg via INTRAVENOUS
  Filled 2016-11-01: qty 5

## 2016-11-01 MED ORDER — TECHNETIUM TC 99M TETROFOSMIN IV KIT
10.0000 | PACK | Freq: Once | INTRAVENOUS | Status: AC | PRN
  Administered 2016-11-01: 10 via INTRAVENOUS

## 2016-11-01 MED ORDER — TECHNETIUM TC 99M TETROFOSMIN IV KIT
30.0000 | PACK | Freq: Once | INTRAVENOUS | Status: AC | PRN
  Administered 2016-11-01: 30 via INTRAVENOUS

## 2016-11-01 MED ORDER — AMLODIPINE BESYLATE 5 MG PO TABS: 5.0000 mg | ORAL_TABLET | Freq: Every day | ORAL | 5 refills | Status: DC

## 2016-11-01 MED ORDER — METOPROLOL TARTRATE 5 MG/5ML IV SOLN
5.0000 mg | Freq: Once | INTRAVENOUS | Status: AC
  Administered 2016-11-01: 5 mg via INTRAVENOUS

## 2016-11-01 MED ORDER — ASPIRIN 81 MG PO TBEC: 81.0000 mg | DELAYED_RELEASE_TABLET | Freq: Every day | ORAL | 11 refills | Status: DC

## 2016-11-01 NOTE — Progress Notes (Signed)
Dr. Eden [name] has reviewed the nuc and the echo she should be on NTG at discharge and we will follow her in in 2-3 weeks. No ischemia and EF on echo normal.

## 2016-11-01 NOTE — Progress Notes (Signed)
  Echocardiogram 2D Echocardiogram has been performed.  Aurthur Wingerter L Androw 11/01/2016, 2:17 PM

## 2016-11-01 NOTE — Progress Notes (Signed)
11/01/2016 5:45 PM Discharge AVS meds taken today and those due this evening reviewed.  Follow-up appointments and when to call md reviewed.  D/C IV and TELE.  Questions and concerns addressed.   D/C home per orders. Kathryne HitchAllen, Kemper Hochman C

## 2016-11-01 NOTE — Discharge Summary (Signed)
Discharge Summary    Patient ID: Diana [name],  MRN: [id number], DOB/AGE: 59-Aug-1957 59 y.o.  Admit date: 10/30/2016 Discharge date: 11/01/2016  Primary Care Provider: Sherren Mocha Primary Cardiologist: New- Dr. Royann Shivers  Discharge Diagnoses    Active Problems:   Cervicalgia   Unstable angina [facility])   Non-ST elevation (NSTEMI) myocardial infarction [facility])   Bronchitis   NSTEMI (non-ST elevated myocardial infarction) (HCC)   Allergies Allergies  Allergen Reactions  . Penicillins Rash    Has patient had a PCN reaction causing immediate rash, facial/tongue/throat swelling, SOB or lightheadedness with hypotension: yes Has patient had a PCN reaction causing severe rash involving mucus membranes or skin necrosis:  no Has patient had a PCN reaction that required hospitalization:  no Has patient had a PCN reaction occurring within the last 10 years: no If all of the above answers are "NO", then may proceed with Cephalosporin use.   . Codeine Anxiety    Diagnostic Studies/Procedures    Myocardial perfusion imaging 11/01/16 IMPRESSION: 1. Fixed defect at the apex.  No stress-induced ischemia.  2. Normal left ventricular wall motion.  3. Left ventricular ejection fraction 44%  4. Non invasive risk stratification*: Intermediate _____________ Echo 11/01/16 Study Conclusions  - Left ventricle: inferobasal hypokinesis. The cavity size was   normal. Wall thickness was increased in a pattern of mild LVH.   Systolic function was normal. The estimated ejection fraction was   in the range of 50% to 55%. - Aortic valve: There was mild regurgitation. - Mitral valve: There was mild regurgitation. - Left atrium: The atrium was mildly dilated. - Atrial septum: There was increased thickness of the septum,   consistent with lipomatous hypertrophy. No defect or patent   foramen ovale was identified.  History of Present Illness     Diana [name] is a pleasant 61 y/o AA female with a  somewhat confusing history. Pt works in housekeeping at [employer]. She has noticed exertional dyspnea and SSCP-"burning". These symptoms have been going on for about a month. She also has problems with chronic neck and shoulder pain and bronchitis (though she never smoked and doesn't have asthma).  She went to her PCP on 6/1 for her bronchitis and neck pain and happened to mention her history of exertional dyspnea and chest discomfort. Labs were drawn and she went home but was called later to go to the ED (presumably secondary to an elevated Troponin). In the ED her Troponin was 0.28 and 0.45. Her EKG shows no acute changes. She was chest pain free on admission. She has had neck and shoulder pain thought to be musculoskeletal.   Pt has no history of DM, treated HTN, HLD, smoking, or a FM Hx of CAD. No documented history of CAD.  Hospital Course     Consultants: None  Pt kept over the weekend on heparin with no evolution in symptoms and no increase in troponins. She was taken for nuclear stress test today that showed fixed defect at the apex, no stress-induced ischemia, normal left ventricular wall motion, left ventricular ejection fraction 44%, Non invasive risk stratification*: Intermediate. Echo showed normal LV function and mild LVH.  Dr. Eden Emms has reviewed the nuclear study and the echo and identifies no ischemia and EF on echo normal and she should be on NTG at discharge and we will follow her in in 2-3 weeks.  Patient has been seen by Dr. Eden Emms today and deemed ready for discharge home. All follow up  appointments have been scheduled. Discharge medications are listed below.  _____________  Discharge Vitals Blood pressure (!) 159/84, pulse 66, temperature 98.2 F (36.8 C), temperature source Oral, resp. rate 20, height 5\' 3"  (1.6 m), weight 130 lb (59 kg), SpO2 100 %.  Filed Weights   10/29/16 2302  Weight: 130 lb (59 kg)    Labs & Radiologic Studies    CBC  Recent Labs   10/31/16 0129 11/01/16 0233  WBC 3.8* 3.9*  HGB 9.4* 9.5*  HCT 30.7* 31.3*  MCV 76.8* 76.7*  PLT 141* 163   Basic Metabolic Panel  Recent Labs  10/29/16 2336 10/31/16 0129  NA 141 138  K 3.6 3.7  CL 108 105  CO2 25 25  GLUCOSE 109* 109*  BUN 19 10  CREATININE 0.45 0.47  CALCIUM 9.0 8.7*   Liver Function Tests No results for input(s): AST, ALT, ALKPHOS, BILITOT, PROT, ALBUMIN in the last 72 hours. No results for input(s): LIPASE, AMYLASE in the last 72 hours. Cardiac Enzymes  Recent Labs  10/30/16 0912 10/30/16 1505 10/30/16 2025  TROPONINI 0.38* 0.31* 0.37*   BNP Invalid input(s): POCBNP D-Dimer No results for input(s): DDIMER in the last 72 hours. Hemoglobin A1C  Recent Labs  10/30/16 0912  HGBA1C 5.8*   Fasting Lipid Panel  Recent Labs  10/31/16 0129  CHOL 176  HDL 59  LDLCALC 107*  TRIG 49  CHOLHDL 3.0   Thyroid Function Tests No results for input(s): TSH, T4TOTAL, T3FREE, THYROIDAB in the last 72 hours.  Invalid input(s): FREET3 _____________  Dg Chest 2 View  Result Date: 10/30/2016 CLINICAL DATA:  Acute onset of shortness of breath and generalized chest pain on exertion. Initial encounter. EXAM: CHEST  2 VIEW COMPARISON:  Chest radiograph performed earlier today at 5:46 p.m. FINDINGS: The lungs are well-aerated and clear. There is no evidence of focal opacification, pleural effusion or pneumothorax. The heart is normal in size; the mediastinal contour is within normal limits. No acute osseous abnormalities are seen. IMPRESSION: No acute cardiopulmonary process seen. Electronically Signed   By: Roanna RaiderJeffery  Chang M.D.   On: 10/30/2016 00:01   Dg Chest 2 View  Result Date: 10/29/2016 CLINICAL DATA:  Worsening shortness of Breath EXAM: CHEST  2 VIEW COMPARISON:  11/04/2010 FINDINGS: The heart size and mediastinal contours are within normal limits. Both lungs are clear. The visualized skeletal structures are unremarkable. IMPRESSION: No active  cardiopulmonary disease. Electronically Signed   By: Alcide CleverMark  Lukens M.D.   On: 10/29/2016 17:51   Nm Myocar Multi W/spect W/wall Motion / Ef  Result Date: 11/01/2016 CLINICAL DATA:  Chest pain EXAM: MYOCARDIAL IMAGING WITH SPECT (REST AND PHARMACOLOGIC-STRESS) GATED LEFT VENTRICULAR WALL MOTION STUDY LEFT VENTRICULAR EJECTION FRACTION TECHNIQUE: Standard myocardial SPECT imaging was performed after resting intravenous injection of 10 mCi [id number] tetrofosmin. Subsequently, intravenous infusion of Lexiscan was performed under the supervision of the Cardiology staff. At peak effect of the drug, 30 mCi Tc-6666m tetrofosmin was injected intravenously and standard myocardial SPECT imaging was performed. Quantitative gated imaging was also performed to evaluate left ventricular wall motion, and estimate left ventricular ejection fraction. COMPARISON:  None. FINDINGS: Perfusion: There is a moderate-sized fixed defect at the apex. There is no stress-induced ischemia. Wall Motion: Normal left ventricular wall motion. No left ventricular dilation. Left Ventricular Ejection Fraction: 44 % End diastolic volume 101 ml End systolic volume 57 ml IMPRESSION: 1. Fixed defect at the apex.  No stress-induced ischemia. 2. Normal left ventricular wall motion. 3.  Left ventricular ejection fraction 44% 4. Non invasive risk stratification*: Intermediate *2012 Appropriate Use Criteria for Coronary Revascularization Focused Update: J Am Coll Cardiol. 2012;59(9):857-881. http://content.dementiazones.com.aspx?articleid=[email or web address] Electronically Signed   By: Jolaine Click M.D.   On: 11/01/2016 14:33   Disposition   Pt is being discharged home today in good condition.  Follow-up Plans & Appointments    Follow-up Information    Wendall Stade, MD Follow up on 11/19/2016.   Specialty:  Cardiology Why:   at 1:30 pm with Nada Boozer , his nurse practitioner. Contact information: [address] [place]  [address] [phone]          Discharge Instructions    Diet - low sodium heart healthy    Complete by:  As directed    Increase activity slowly    Complete by:  As directed       Discharge Medications   Current Discharge Medication List    START taking these medications   Details  amLODipine (NORVASC) 5 MG tablet Take 1 tablet (5 mg total) by mouth daily. Qty: 30 tablet, Refills: 5    aspirin EC 81 MG EC tablet Take 1 tablet (81 mg total) by mouth daily. Qty: 30 tablet, Refills: 11    nitroGLYCERIN (NITROSTAT) 0.4 MG SL tablet Place 1 tablet (0.4 mg total) under the tongue every 5 (five) minutes x 3 doses as needed for chest pain. Qty: 25 tablet, Refills: 12      CONTINUE these medications which have NOT CHANGED   Details  Azelastine-Fluticasone 137-50 MCG/ACT SUSP Place 1 spray into the nose 2 (two) times daily. Qty: 1 Bottle, Refills: 5    clindamycin (CLEOCIN) 300 MG capsule Take 300 mg by mouth 3 (three) times daily.    methocarbamol (ROBAXIN) 500 MG tablet Take 1 tablet (500 mg total) by mouth 4 (four) times daily. Qty: 120 tablet, Refills: 1    traMADol (ULTRAM) 50 MG tablet Take 50 mg by mouth every 4 (four) hours as needed.    cetirizine (ZYRTEC) 10 MG tablet Take 1 tablet (10 mg total) by mouth at bedtime. Qty: 90 tablet, Refills: 3    montelukast (SINGULAIR) 10 MG tablet Take 1 tablet (10 mg total) by mouth at bedtime. Qty: 30 tablet, Refills: 3    omeprazole (PRILOSEC) 40 MG capsule Take 1 capsule (40 mg total) by mouth daily. 30 minutes before a meal Qty: 30 capsule, Refills: 1         Outstanding Labs/Studies   None  Duration of Discharge Encounter   Greater than 30 minutes including physician time.  Signed, Berton Bon NP 11/01/2016, 4:20 PM

## 2016-11-01 NOTE — Progress Notes (Signed)
ANTICOAGULATION CONSULT NOTE  Pharmacy Consult for heparin  Indication: chest pain/ACS  Allergies  Allergen Reactions  . Penicillins Rash    Has patient had a PCN reaction causing immediate rash, facial/tongue/throat swelling, SOB or lightheadedness with hypotension: yes Has patient had a PCN reaction causing severe rash involving mucus membranes or skin necrosis:  no Has patient had a PCN reaction that required hospitalization:  no Has patient had a PCN reaction occurring within the last 10 years: no If all of the above answers are "NO", then may proceed with Cephalosporin use.   . Codeine Anxiety   Patient Measurements: Height: 5\' 3"  (160 cm) Weight: 130 lb (59 kg) IBW/kg (Calculated) : 52.4  Vital Signs: Temp: 98.4 F (36.9 C) (06/04 0425) Temp Source: Oral (06/04 0425) BP: 146/71 (06/04 0425) Pulse Rate: 68 (06/04 0425)  Labs:  Recent Labs  10/29/16 2336 10/30/16 0203  10/30/16 0912 10/30/16 1505 10/30/16 2025 10/31/16 0129 11/01/16 0233  HGB 10.5*  --   --   --   --   --  9.4* 9.5*  HCT 32.3*  --   --   --   --   --  30.7* 31.3*  PLT 152  --   --   --   --   --  141* 163  APTT  --  28  --   --   --   --   --   --   LABPROT  --  13.1  --   --   --   --   --   --   INR  --  0.99  --   --   --   --   --   --   HEPARINUNFRC  --   --   < > 0.53 0.49  --  0.48 0.42  CREATININE 0.45  --   --   --   --   --  0.47  --   TROPONINI  --   --   < > 0.38* 0.31* 0.37*  --   --   < > = values in this interval not displayed.  Estimated Creatinine Clearance: 63.4 mL/min (by C-G formula based on SCr of 0.47 mg/dL).   Medical History: Past Medical History:  Diagnosis Date  . Allergy   . Arthritis    feet  . Carpal tunnel syndrome    Medications:  Scheduled:  . aspirin EC  81 mg Oral Daily  . atorvastatin  40 mg Oral q1800  . fluticasone  1 spray Each Nare Daily  . metoprolol tartrate  25 mg Oral BID  . regadenoson  0.4 mg Intravenous Once   Infusions:  . heparin  700 Units/hr (10/30/16 0303)   Assessment: 61 yo female here with r/o ACS on heparin. Patient to have ECHO today to further evaluate need for cardiac cath. The patients chest pain has resolved, despite elevated troponin. HgB low (9.5) but stable. No bleeding documented.   Goal of Therapy:  Heparin level 0.3-0.7 units/ml Monitor platelets by anticoagulation protocol: Yes   Plan:  -Continue heparin gtt at 700 units/hr -Daily heparin level and CBC -Monitor for s/sx of bleeding -F/U cath plans  Ruben Imony Taiki Buckwalter, PharmD Clinical Pharmacist 11/01/2016 9:52 AM

## 2016-11-01 NOTE — Progress Notes (Signed)
Pt had HTN response with lexiscan and ST depression in inf lat leads and HR to 125 that did not come down witout 5 min.  Lopressor 5 mg IV given and slow decrease of HR and BP decreased.  Dr. Eden EmmsNishan notified and viewed the EKGs and pt.  She had a brief period of chest discomfort but resolved quickly.  May need cardiac cath, will know more with nuc results and echo, she does have LVH.

## 2016-11-01 NOTE — Progress Notes (Signed)
Progress Note  Patient Name: Diana [name] Date of Encounter: 11/01/2016  Primary Cardiologist: Croitoru  Subjective   Frustrated as no definitive plans for this am. Echo not done over weekend and no stress test or cath scheduled No chest pain this am   Inpatient Medications    Scheduled Meds: . aspirin EC  81 mg Oral Daily  . atorvastatin  40 mg Oral q1800  . fluticasone  1 spray Each Nare Daily  . metoprolol tartrate  25 mg Oral BID  . regadenoson  0.4 mg Intravenous Once   Continuous Infusions: . heparin 700 Units/hr (10/30/16 0303)   PRN Meds: acetaminophen, nitroGLYCERIN, ondansetron (ZOFRAN) IV, sodium chloride, traMADol   Vital Signs    Vitals:   10/31/16 0533 10/31/16 1500 10/31/16 2011 11/01/16 0425  BP: 130/77 (!) 152/83 (!) 180/86 (!) 146/71  Pulse: 67 70 82 68  Resp: 18 18 18 18   Temp: 98.2 F (36.8 C) 98.5 F (36.9 C) 98.5 F (36.9 C) 98.4 F (36.9 C)  TempSrc: Oral Oral Oral Oral  SpO2: 100% 100% 100% 100%  Weight:      Height:       No intake or output data in the 24 hours ending 11/01/16 0825 Filed Weights   10/29/16 2302  Weight: 130 lb (59 kg)    Telemetry    NSR 11/01/2016  - Personally Reviewed  ECG    NSR nonspecific lateral T wave changes  - Personally Reviewed  Physical Exam  Black female  GEN: No acute distress.   Neck: No JVD Cardiac: RRR, no murmurs, rubs, or gallops.  Respiratory: Clear to auscultation bilaterally. GI: Soft, nontender, non-distended  MS: No edema; No deformity. Neuro:  Nonfocal  Psych: Normal affect   Labs    Chemistry  Recent Labs Lab 10/29/16 2336 10/31/16 0129  NA 141 138  K 3.6 3.7  CL 108 105  CO2 25 25  GLUCOSE 109* 109*  BUN 19 10  CREATININE 0.45 0.47  CALCIUM 9.0 8.7*  GFRNONAA >60 >60  GFRAA >60 >60  ANIONGAP 8 8     Hematology  Recent Labs Lab 10/29/16 2336 10/31/16 0129 11/01/16 0233  WBC 2.9* 3.8* 3.9*  RBC 4.28 4.00 4.08  HGB 10.5* 9.4* 9.5*  HCT 32.3* 30.7*  31.3*  MCV 75.5* 76.8* 76.7*  MCH 24.5* 23.5* 23.3*  MCHC 32.5 30.6 30.4  RDW 14.1 14.0 13.9  PLT 152 141* 163    Cardiac Enzymes  Recent Labs Lab 10/30/16 0550 10/30/16 0912 10/30/16 1505 10/30/16 2025  TROPONINI 0.45* 0.38* 0.31* 0.37*     Recent Labs Lab 10/29/16 2345  TROPIPOC 0.28*     BNP  Recent Labs Lab 10/30/16 0912  BNP 153.0*     DDimer No results for input(s): DDIMER in the last 168 hours.   Radiology    No results found.  Cardiac Studies   Pending echo  Patient Profile     61 y.o. female admitted with dyspnea and SSCP burning for a month Saw PCP for neck pain and bronchitis troponin positive and admitted from home No previously documented CAD. No real risk factors and LDL 129  Assessment & Plan    1). Chest Pain:  Despite elevated troponin no evolution. Have called echo lab to expedite [place] Unable to cath today lab full Will risk stratify with lexiscan myouve given no known disease and resolved symptoms. Patient in agreement. If myovue or echo abnormal can cath tomorrow.  2. GERD:  Continue prilosec  3) Bronchitis normal exam CXR on admission NAD  4) Anemia f/u Dr Clelia CroftShaw retic count normal 10/29/16 with normal iron sats   Signed, Charlton HawsPeter Gerlean Cid, MD  11/01/2016, 8:25 AM

## 2016-11-02 ENCOUNTER — Telehealth: Payer: Self-pay | Admitting: Cardiovascular Disease

## 2016-11-02 ENCOUNTER — Encounter: Payer: Self-pay | Admitting: *Deleted

## 2016-11-02 ENCOUNTER — Telehealth: Payer: Self-pay | Admitting: Family Medicine

## 2016-11-02 SURGERY — LEFT HEART CATH AND CORONARY ANGIOGRAPHY
Anesthesia: LOCAL

## 2016-11-02 NOTE — Telephone Encounter (Signed)
Spoke with pt, aware letter generated and placed at the front desk for pick up. 

## 2016-11-02 NOTE — Telephone Encounter (Signed)
DR SHAW PT CALLING TO LET YOU KNOW THAT SHE WAS DISCHARGEDFROM HOSPITAL YESTERDAY (MON) AND THAT IT WAS DUE TO STRESS SHE ALSO WOULD LIKE FOR YOU TO SET HER UP WITH A REFERRAL FOR HR NECK AND SHOULDER

## 2016-11-02 NOTE — Telephone Encounter (Signed)
11/25/16 next ov with shaw

## 2016-11-02 NOTE — Telephone Encounter (Signed)
Please go ahead and send her a note to that effect thank you

## 2016-11-02 NOTE — Telephone Encounter (Signed)
Will forward to dr crpitoru for okay for patient to return to work 11-08-16 with no restrictions.

## 2016-11-02 NOTE — Telephone Encounter (Signed)
PT CALLING AGAIN ABOUT HER REFERRAL FOR PT PLEASE RESPOND

## 2016-11-02 NOTE — Telephone Encounter (Signed)
Referral is ready to be sent. I called [facility] to get their fax number and am waiting on a callback. PT advised.

## 2016-11-02 NOTE — Telephone Encounter (Signed)
See PT referral placed 4/20 - it was sent to [facility] but pt never heard from them. Would you mind contacting them to see if they got the referral or are accepting pt?    To [facility] [address] [place], [place] [address]  [phone] - pt would benefit from myofascial release in addition to other PT techniques - eval and treat as needed - for chronic c-spine/trapezius muscle spasms and chronic right rhomboid spasm     Also, I had placed a cardiology referral on 6/1 which can be cancelled. Pt saw cardiology in the hosp and already has o/p f/u arranged. thanks

## 2016-11-02 NOTE — Telephone Encounter (Signed)
Patient calling, was seen in hospital by Croitoru. Patient states that she was told that she could have a doctor's note stating that she can stay out the rest of week and go back to work on Monday, 11-08-16. Thanks.

## 2016-11-18 ENCOUNTER — Telehealth: Payer: Self-pay | Admitting: Family Medicine

## 2016-11-18 NOTE — Telephone Encounter (Signed)
Patient should just continue being seen at [facility]. I do not think she needs to also follow-up at [facility] unless she wants a second opinion from her [facility] visit. It sounds like they think she is doing really well and I'm really happy to hear that she is no longer having any symptoms of chest pain while working.

## 2016-11-18 NOTE — Telephone Encounter (Signed)
Pt called requesting phone number to [facility] so I gave that to her. Pt also wanted to know which cardio doctor she should be seeing. She had an appt she thinks at [facility] but also has been set up with [facility] on [address]. I saw the note that we could cancel the cardio referral but we had already sent that referral and failed to cancel it. Is there a preference of who the pt should continue to see for her cardiology appointments? Thank you!

## 2016-11-19 ENCOUNTER — Ambulatory Visit: Payer: Self-pay | Admitting: Cardiology

## 2016-11-25 ENCOUNTER — Ambulatory Visit: Payer: BLUE CROSS/BLUE SHIELD | Admitting: Family Medicine

## 2016-11-26 ENCOUNTER — Telehealth: Payer: Self-pay | Admitting: Family Medicine

## 2016-11-26 NOTE — Telephone Encounter (Signed)
Pt would like you to give her a call about her physical therapy referral, she states they are not in network with her insurance.  Contact number [phone]

## 2016-11-26 NOTE — Telephone Encounter (Signed)
Could you help with this?

## 2016-11-29 NOTE — Telephone Encounter (Signed)
Spoke with pt and sent her to [facility] at [place] [facility]/[place]. They are in network with her insurance.

## 2016-12-02 ENCOUNTER — Telehealth: Payer: Self-pay | Admitting: Family Medicine

## 2016-12-02 NOTE — Telephone Encounter (Signed)
Pt is needing to get a refill on her flonase and it is not covered by insurance any longer and the plain saline Is not working and would like for something to be called in   Best number [phone]

## 2016-12-06 ENCOUNTER — Other Ambulatory Visit: Payer: Self-pay | Admitting: Emergency Medicine

## 2016-12-06 MED ORDER — FLUTICASONE PROPIONATE 50 MCG/ACT NA SUSP: 2.0000 | Freq: Every day | NASAL | 2 refills | Status: DC

## 2016-12-06 NOTE — Progress Notes (Unsigned)
ase

## 2016-12-06 NOTE — Telephone Encounter (Signed)
Flonase nasal spray e-scribed to pharmacy

## 2016-12-09 ENCOUNTER — Ambulatory Visit: Payer: Self-pay | Admitting: Cardiology

## 2016-12-09 NOTE — Progress Notes (Deleted)
Cardiology Office Note   Date:  12/09/2016   ID:  Diana [name], DOB 10/13/1955, MRN [id number]  PCP:  Sherren MochaShaw, Eva N, MD  Cardiologist:  Dr. Judie PetitM. Croitoru     No chief complaint on file.     History of Present Illness: Diana [name] is a 61 y.o. female who presents for hospital follow up for chest pain and elevated troponin but not in usual pattern.  0.45;0.38;0.31;0.37.  nuc study was without ischemia and echo with normal EF 50-55%, mild LVH,  She was discharged on ASA, added amlodipine and NTG sl.  She has gone back to work.   Today***    Past Medical History:  Diagnosis Date  . Allergy   . Arthritis    feet  . Carpal tunnel syndrome     Past Surgical History:  Procedure Laterality Date  . CARPAL TUNNEL RELEASE Left 02/12/2016   Procedure: LEFT CARPAL TUNNEL RELEASE;  Surgeon: Cindee SaltGary Kuzma, MD;  Location: [facility];  Service: Orthopedics;  Laterality: Left;  FAB  . TONSILLECTOMY    . TUBAL LIGATION    . WRIST SURGERY Right      Current Outpatient Prescriptions  Medication Sig Dispense Refill  . amLODipine (NORVASC) 5 MG tablet Take 1 tablet (5 mg total) by mouth daily. 30 tablet 5  . aspirin EC 81 MG EC tablet Take 1 tablet (81 mg total) by mouth daily. 30 tablet 11  . Azelastine-Fluticasone 137-50 MCG/ACT SUSP Place 1 spray into the nose 2 (two) times daily. (Patient taking differently: Place 1 spray into the nose 2 (two) times daily as needed (congestion). ) 1 Bottle 5  . cetirizine (ZYRTEC) 10 MG tablet Take 1 tablet (10 mg total) by mouth at bedtime. (Patient not taking: Reported on 10/30/2016) 90 tablet 3  . clindamycin (CLEOCIN) 300 MG capsule Take 300 mg by mouth 3 (three) times daily.    . fluticasone (FLONASE) 50 MCG/ACT nasal spray Place 2 sprays into both nostrils daily. 16 g 2  . methocarbamol (ROBAXIN) 500 MG tablet Take 1 tablet (500 mg total) by mouth 4 (four) times daily. (Patient taking differently: Take 500 mg by mouth 4 (four) times daily  as needed for muscle spasms. ) 120 tablet 1  . montelukast (SINGULAIR) 10 MG tablet Take 1 tablet (10 mg total) by mouth at bedtime. (Patient not taking: Reported on 10/30/2016) 30 tablet 3  . nitroGLYCERIN (NITROSTAT) 0.4 MG SL tablet Place 1 tablet (0.4 mg total) under the tongue every 5 (five) minutes x 3 doses as needed for chest pain. 25 tablet 12  . omeprazole (PRILOSEC) 40 MG capsule Take 1 capsule (40 mg total) by mouth daily. 30 minutes before a meal (Patient not taking: Reported on 10/30/2016) 30 capsule 1  . traMADol (ULTRAM) 50 MG tablet Take 50 mg by mouth every 4 (four) hours as needed.     No current facility-administered medications for this visit.     Allergies:   Penicillins and Codeine    Social History:  The patient  reports that she has never smoked. She has never used smokeless tobacco. She reports that she does not drink alcohol or use drugs.   Family History:  The patient's ***family history includes Brain cancer in her sister; Thyroid disease in her mother.    ROS:  General:no colds or fevers, no weight changes Skin:no rashes or ulcers HEENT:no blurred vision, no congestion CV:see HPI PUL:see HPI GI:no diarrhea constipation or melena, no indigestion GU:no  hematuria, no dysuria MS:no joint pain, no claudication Neuro:no syncope, no lightheadedness Endo:no diabetes, no thyroid disease Wt Readings from Last 3 Encounters:  10/29/16 130 lb (59 kg)  10/29/16 130 lb 9.6 oz (59.2 kg)  10/12/16 132 lb (59.9 kg)     PHYSICAL EXAM: VS:  There were no vitals taken for this visit. , BMI There is no height or weight on file to calculate BMI. General:Pleasant affect, NAD Skin:Warm and dry, brisk capillary refill HEENT:normocephalic, sclera clear, mucus membranes moist Neck:supple, no JVD, no bruits  Heart:S1S2 RRR without murmur, gallup, rub or click Lungs:clear without rales, rhonchi, or wheezes YNW:GNFA, non tender, + BS, do not palpate liver spleen or masses Ext:no  lower ext edema, 2+ pedal pulses, 2+ radial pulses Neuro:alert and oriented, MAE, follows commands, + facial symmetry    EKG:  EKG is ordered today. The ekg ordered today demonstrates ***   Recent Labs: 01/29/2016: ALT 14; TSH 0.66 10/30/2016: B Natriuretic Peptide 153.0 10/31/2016: BUN 10; Creatinine, Ser 0.47; Potassium 3.7; Sodium 138 11/01/2016: Hemoglobin 9.5; Platelets 163    Lipid Panel    Component Value Date/Time   CHOL 176 10/31/2016 0129   TRIG 49 10/31/2016 0129   HDL 59 10/31/2016 0129   CHOLHDL 3.0 10/31/2016 0129   VLDL 10 10/31/2016 0129   LDLCALC 107 (H) 10/31/2016 0129       Other studies Reviewed: Additional studies/ records that were reviewed today include: ***.   ASSESSMENT AND PLAN:  1.  ***   Current medicines are reviewed with the patient today.  The patient Has no concerns regarding medicines.  The following changes have been made:  See above Labs/ tests ordered today include:see above  Disposition:   FU:  see above  Signed, Nada Boozer, NP  12/09/2016 2:17 PM    [facility] [address] [place], [place], [place]  [address]/ [address] [place], [place] Phone: [phone]; Fax: [phone]  [phone]

## 2016-12-15 ENCOUNTER — Encounter: Payer: Self-pay | Admitting: Cardiology

## 2017-01-15 ENCOUNTER — Ambulatory Visit (INDEPENDENT_AMBULATORY_CARE_PROVIDER_SITE_OTHER): Payer: BLUE CROSS/BLUE SHIELD | Admitting: Family Medicine

## 2017-01-15 ENCOUNTER — Encounter: Payer: Self-pay | Admitting: Family Medicine

## 2017-01-15 VITALS — BP 118/78 | HR 88 | Temp 98.1°F | Resp 18 | Ht 63.0 in | Wt 128.6 lb

## 2017-01-15 DIAGNOSIS — R399 Unspecified symptoms and signs involving the genitourinary system: Secondary | ICD-10-CM | POA: Diagnosis not present

## 2017-01-15 DIAGNOSIS — J0101 Acute recurrent maxillary sinusitis: Secondary | ICD-10-CM

## 2017-01-15 DIAGNOSIS — M542 Cervicalgia: Secondary | ICD-10-CM

## 2017-01-15 DIAGNOSIS — E559 Vitamin D deficiency, unspecified: Secondary | ICD-10-CM

## 2017-01-15 DIAGNOSIS — M62830 Muscle spasm of back: Secondary | ICD-10-CM

## 2017-01-15 DIAGNOSIS — Z5181 Encounter for therapeutic drug level monitoring: Secondary | ICD-10-CM

## 2017-01-15 DIAGNOSIS — I208 Other forms of angina pectoris: Secondary | ICD-10-CM | POA: Diagnosis not present

## 2017-01-15 DIAGNOSIS — R7989 Other specified abnormal findings of blood chemistry: Secondary | ICD-10-CM | POA: Diagnosis not present

## 2017-01-15 DIAGNOSIS — Z9109 Other allergy status, other than to drugs and biological substances: Secondary | ICD-10-CM

## 2017-01-15 DIAGNOSIS — I10 Essential (primary) hypertension: Secondary | ICD-10-CM

## 2017-01-15 DIAGNOSIS — D649 Anemia, unspecified: Secondary | ICD-10-CM

## 2017-01-15 LAB — POCT URINALYSIS DIP (MANUAL ENTRY)
BILIRUBIN UA: NEGATIVE
Glucose, UA: NEGATIVE mg/dL
Ketones, POC UA: NEGATIVE mg/dL
LEUKOCYTES UA: NEGATIVE
NITRITE UA: NEGATIVE
PH UA: 6.5 (ref 5.0–8.0)
Spec Grav, UA: 1.025 (ref 1.010–1.025)
Urobilinogen, UA: 0.2 E.U./dL

## 2017-01-15 LAB — POC MICROSCOPIC URINALYSIS (UMFC): Mucus: ABSENT

## 2017-01-15 MED ORDER — AZITHROMYCIN 250 MG PO TABS: ORAL_TABLET | ORAL | 0 refills | Status: DC

## 2017-01-15 MED ORDER — BACLOFEN 20 MG PO TABS: 20.0000 mg | ORAL_TABLET | Freq: Four times a day (QID) | ORAL | 0 refills | Status: DC | PRN

## 2017-01-15 MED ORDER — MONTELUKAST SODIUM 10 MG PO TABS: 10.0000 mg | ORAL_TABLET | Freq: Every day | ORAL | 3 refills | Status: DC

## 2017-01-15 MED ORDER — LISINOPRIL 40 MG PO TABS: 40.0000 mg | ORAL_TABLET | Freq: Every day | ORAL | 0 refills | Status: DC

## 2017-01-15 MED ORDER — PREDNISONE 20 MG PO TABS: ORAL_TABLET | ORAL | 0 refills | Status: DC

## 2017-01-15 MED ORDER — ROSUVASTATIN CALCIUM 10 MG PO TABS: 10.0000 mg | ORAL_TABLET | Freq: Every day | ORAL | 1 refills | Status: DC

## 2017-01-15 MED ORDER — METOPROLOL SUCCINATE ER 25 MG PO TB24: 25.0000 mg | ORAL_TABLET | Freq: Every day | ORAL | 1 refills | Status: DC

## 2017-01-15 MED ORDER — CETIRIZINE HCL 10 MG PO TABS: 10.0000 mg | ORAL_TABLET | Freq: Every day | ORAL | 3 refills | Status: DC

## 2017-01-15 NOTE — Patient Instructions (Addendum)
You can use any type of over-the-counter nasal steroid spray - like flonase, nasonex, nasacort, rhinocort - 2 sprays each day.    IF you received an x-ray today, you will receive an invoice from [facility]. Please contact [facility] at [phone] with questions or concerns regarding your invoice.   IF you received labwork today, you will receive an invoice from [facility]. Please contact [facility] at [phone] with questions or concerns regarding your invoice.   Our billing staff will not be able to assist you with questions regarding bills from these companies.  You will be contacted with the lab results as soon as they are available. The fastest way to get your results is to activate your My Chart account. Instructions are located on the last page of this paperwork. If you have not heard from [place] regarding the results in 2 weeks, please contact this office.     Muscle Cramps and Spasms Muscle cramps and spasms occur when a muscle or muscles tighten and you have no control over this tightening (involuntary muscle contraction). They are a common problem and can develop in any muscle. The most common place is in the calf muscles of the leg. Muscle cramps and muscle spasms are both involuntary muscle contractions, but there are some differences between the two:  Muscle cramps are painful. They come and go and may last a few seconds to 15 minutes. Muscle cramps are often more forceful and last longer than muscle spasms.  Muscle spasms may or may not be painful. They may also last just a few seconds or much longer.  Certain medical conditions, such as diabetes or Parkinson disease, can make it more likely to develop cramps or spasms. However, cramps or spasms are usually not caused by a serious underlying problem. Common causes include:  Overexertion.  Overuse from repetitive motions, or doing the same thing over and over.  Remaining in a certain position for a long period  of time.  Improper preparation, form, or technique while playing a sport or doing an activity.  Dehydration.  Injury.  Side effects of some medicines.  Abnormally low levels of the salts and ions in your blood (electrolytes), especially potassium and calcium. This could happen if you are taking water pills (diuretics) or if you are pregnant.  In many cases, the cause of muscle cramps or spasms is unknown. Follow these instructions at home:  Stay well hydrated. Drink enough fluid to keep your urine clear or pale yellow.  Try massaging, stretching, and relaxing the affected muscle.  If directed, apply heat to tight or tense muscles as often as told by your health care provider. Use the heat source that your health care provider recommends, such as a moist heat pack or a heating pad. ? Place a towel between your skin and the heat source. ? Leave the heat on for 20-30 minutes. ? Remove the heat if your skin turns bright red. This is especially important if you are unable to feel pain, heat, or cold. You may have a greater risk of getting burned.  If directed, put ice on the affected area. This may help if you are sore or have pain after a cramp or spasm. ? Put ice in a plastic bag. ? Place a towel between your skin and the bag. ? Leavethe ice on for 20 minutes, 2-3 times a day.  Take over-the-counter and prescription medicines only as told by your health care provider.  Pay attention to any changes in your  symptoms. Contact a health care provider if:  Your cramps or spasms get more severe or happen more often.  Your cramps or spasms do not improve over time. This information is not intended to replace advice given to you by your health care provider. Make sure you discuss any questions you have with your health care provider. Document Released: 11/06/2001 Document Revised: 06/18/2015 Document Reviewed: 02/18/2015 Elsevier Interactive Patient Education  2018 [employer]  Inc.   Back Exercises The following exercises strengthen the muscles that help to support the back. They also help to keep the lower back flexible. Doing these exercises can help to prevent back pain or lessen existing pain. If you have back pain or discomfort, try doing these exercises 2-3 times each day or as told by your health care provider. When the pain goes away, do them once each day, but increase the number of times that you repeat the steps for each exercise (do more repetitions). If you do not have back pain or discomfort, do these exercises once each day or as told by your health care provider. Exercises Single Knee to Chest  Repeat these steps 3-5 times for each leg: 1. Lie on your back on a firm bed or the floor with your legs extended. 2. Bring one knee to your chest. Your other leg should stay extended and in contact with the floor. 3. Hold your knee in place by grabbing your knee or thigh. 4. Pull on your knee until you feel a gentle stretch in your lower back. 5. Hold the stretch for 10-30 seconds. 6. Slowly release and straighten your leg.  Pelvic Tilt  Repeat these steps 5-10 times: 1. Lie on your back on a firm bed or the floor with your legs extended. 2. Bend your knees so they are pointing toward the ceiling and your feet are flat on the floor. 3. Tighten your lower abdominal muscles to press your lower back against the floor. This motion will tilt your pelvis so your tailbone points up toward the ceiling instead of pointing to your feet or the floor. 4. With gentle tension and even breathing, hold this position for 5-10 seconds.  Cat-Cow  Repeat these steps until your lower back becomes more flexible: 1. Get into a hands-and-knees position on a firm surface. Keep your hands under your shoulders, and keep your knees under your hips. You may place padding under your knees for comfort. 2. Let your head hang down, and point your tailbone toward the floor so your lower  back becomes rounded like the back of a cat. 3. Hold this position for 5 seconds. 4. Slowly lift your head and point your tailbone up toward the ceiling so your back forms a sagging arch like the back of a cow. 5. Hold this position for 5 seconds.  Press-Ups  Repeat these steps 5-10 times: 1. Lie on your abdomen (face-down) on the floor. 2. Place your palms near your head, about shoulder-width apart. 3. While you keep your back as relaxed as possible and keep your hips on the floor, slowly straighten your arms to raise the top half of your body and lift your shoulders. Do not use your back muscles to raise your upper torso. You may adjust the placement of your hands to make yourself more comfortable. 4. Hold this position for 5 seconds while you keep your back relaxed. 5. Slowly return to lying flat on the floor.  Bridges  Repeat these steps 10 times: 1. Lie on your  back on a firm surface. 2. Bend your knees so they are pointing toward the ceiling and your feet are flat on the floor. 3. Tighten your buttocks muscles and lift your buttocks off of the floor until your waist is at almost the same height as your knees. You should feel the muscles working in your buttocks and the back of your thighs. If you do not feel these muscles, slide your feet 1-2 inches farther away from your buttocks. 4. Hold this position for 3-5 seconds. 5. Slowly lower your hips to the starting position, and allow your buttocks muscles to relax completely.  If this exercise is too easy, try doing it with your arms crossed over your chest. Abdominal Crunches  Repeat these steps 5-10 times: 1. Lie on your back on a firm bed or the floor with your legs extended. 2. Bend your knees so they are pointing toward the ceiling and your feet are flat on the floor. 3. Cross your arms over your chest. 4. Tip your chin slightly toward your chest without bending your neck. 5. Tighten your abdominal muscles and slowly raise your  trunk (torso) high enough to lift your shoulder blades a tiny bit off of the floor. Avoid raising your torso higher than that, because it can put too much stress on your low back and it does not help to strengthen your abdominal muscles. 6. Slowly return to your starting position.  Back Lifts Repeat these steps 5-10 times: 1. Lie on your abdomen (face-down) with your arms at your sides, and rest your forehead on the floor. 2. Tighten the muscles in your legs and your buttocks. 3. Slowly lift your chest off of the floor while you keep your hips pressed to the floor. Keep the back of your head in line with the curve in your back. Your eyes should be looking at the floor. 4. Hold this position for 3-5 seconds. 5. Slowly return to your starting position.  Contact a health care provider if:  Your back pain or discomfort gets much worse when you do an exercise.  Your back pain or discomfort does not lessen within 2 hours after you exercise. If you have any of these problems, stop doing these exercises right away. Do not do them again unless your health care provider says that you can. Get help right away if:  You develop sudden, severe back pain. If this happens, stop doing the exercises right away. Do not do them again unless your health care provider says that you can. This information is not intended to replace advice given to you by your health care provider. Make sure you discuss any questions you have with your health care provider. Document Released: 06/24/2004 Document Revised: 09/24/2015 Document Reviewed: 07/11/2014 Elsevier Interactive Patient Education  2017 [employer].

## 2017-01-15 NOTE — Progress Notes (Signed)
By signing my name below, I, Mesha Guinyard, attest that this documentation has been prepared under the direction and in the presence of Norberto Sorenson, MD.  Electronically Signed: Arvilla Market, Medical Scribe. 01/15/17. 12:16 PM.  Subjective:    Patient ID: Diana [name], female    DOB: March 08, 1956, 61 y.o.   MRN: [id number]  HPI Chief Complaint  Patient presents with  . Back Pain  . Shoulder Pain    bilateral     HPI Comments: Diana [name] is a 61 y.o. female who presents to Primary Care at [facility] complaining of back pain and bilateral shoulder pain.  Back Pain: Pt has been carrying the cleaning machine up and down the steps at [facility] since they don't have an elevator in their buildings. Pt felt better after PT but had to discontinue due to financial concerns. Pt has been taking robaxin for little relief of her sxs since pt has been having cramps in the back of her leg and it goes to her thigh. Pt has had pressure point therapy under her arm to help with her arm mobility. She tried flexeril and zanaflex in the past but it make her too tired.   Congestion: Reports associated sxs of HA, sinus pain, and being off balance for a few weeks. 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 Pt has used OTC decongestant for some relief of her sxs. Pt takes singular, and zyrtec daily for little relief of sxs.  Chest Pain: Pt took 3 nitroglycerin a different times when her chest began to hurt. Pt took it when her supervisor was irritating her and when she had some anxiety. Pt was told by the different cardiologist that her chest pain was stress related.  Pt's insurance wouldn't pay for flonase.   Patient Active Problem List   Diagnosis Date Noted  . Unstable angina (HCC) 10/30/2016  . Non-ST elevation (NSTEMI) myocardial infarction (HCC) 10/30/2016  . Bronchitis 10/30/2016  . NSTEMI (non-ST  elevated myocardial infarction) (HCC) 10/30/2016  . Carpal tunnel syndrome 12/18/2015  . Unilateral osteoarthritis of first carpometacarpal (CMC) joint 12/18/2015  . Cervicalgia 12/18/2015  . Benign essential HTN 04/08/2015  . Metatarsalgia of right foot 02/11/2015  . Degeneration of intervertebral disc of lumbar region 12/18/2014  . Acid reflux 12/18/2014   Past Medical History:  Diagnosis Date  . Allergy   . Arthritis    feet  . Carpal tunnel syndrome    Past Surgical History:  Procedure Laterality Date  . CARPAL TUNNEL RELEASE Left 02/12/2016   Procedure: LEFT CARPAL TUNNEL RELEASE;  Surgeon: Cindee Salt, MD;  Location: [facility];  Service: Orthopedics;  Laterality: Left;  FAB  . TONSILLECTOMY    . TUBAL LIGATION    . WRIST SURGERY Right    Allergies  Allergen Reactions  . Penicillins Rash    Has patient had a PCN reaction causing immediate rash, facial/tongue/throat swelling, SOB or lightheadedness with hypotension: yes Has patient had a PCN reaction causing severe rash involving mucus membranes or skin necrosis:  no Has patient had a PCN reaction that required hospitalization:  no Has patient had a PCN reaction occurring within the last 10 years: no If all of the above answers are "NO", then may proceed with Cephalosporin use.   . Codeine Anxiety   Prior to Admission medications   Medication Sig Start Date End Date Taking? Authorizing Provider  amLODipine (NORVASC) 5 MG tablet Take 1 tablet (5 mg total) by mouth daily. 11/01/16  Yes Berton Bon, NP  aspirin EC 81 MG EC tablet Take 1 tablet (81 mg total) by mouth daily. 11/02/16  Yes Berton Bon, NP  cetirizine (ZYRTEC) 10 MG tablet Take 1 tablet (10 mg total) by mouth at bedtime. 10/29/16  Yes Sherren Mocha, MD  fluticasone (FLONASE) 50 MCG/ACT nasal spray Place 2 sprays into both nostrils daily. 12/06/16  Yes Sherren Mocha, MD  metoprolol succinate (TOPROL-XL) 25 MG 24 hr tablet Take 25 mg by mouth daily.    Yes [provider]  montelukast (SINGULAIR) 10 MG tablet Take 1 tablet (10 mg total) by mouth at bedtime. 10/29/16  Yes Sherren Mocha, MD  nitroGLYCERIN (NITROSTAT) 0.4 MG SL tablet Place 0.4 mg under the tongue every 5 (five) minutes as needed for chest pain.   Yes [provider]  rosuvastatin (CRESTOR) 10 MG tablet Take 10 mg by mouth daily.   Yes [provider]   Social History   Social History  . Marital status: Divorced    Spouse name: Alcario Drought  . Number of children: 2  . Years of education: 12th grade   Occupational History  . floor tech    Social History Main Topics  . Smoking status: Never Smoker  . Smokeless tobacco: Never Used  . Alcohol use No  . Drug use: No  . Sexual activity: No   Other Topics Concern  . Not on file   Social History Narrative   Divorced from her husband. She has two adult sons from that union . One lives in [place], the other lives in [place] (as a Solicitor) following Office manager.   Married Alcario Drought 05/2014.   Review of Systems  HENT: Positive for congestion and sinus pain.   Cardiovascular: Positive for chest pain.  Musculoskeletal: Positive for arthralgias and back pain.  Neurological: Positive for dizziness and headaches.  Psychiatric/Behavioral: The patient is nervous/anxious.    Objective:  Physical Exam  Constitutional: She appears well-developed and well-nourished. No distress.  HENT:  Head: Normocephalic and atraumatic.  Nose: Right sinus exhibits maxillary sinus tenderness. Right sinus exhibits no frontal sinus tenderness. Left sinus exhibits maxillary sinus tenderness. Left sinus exhibits no frontal sinus tenderness.  Eyes: Conjunctivae are normal.  Neck: Neck supple. No thyroid mass and no thyromegaly present.  Nl thyroid  Cardiovascular: Normal rate, regular rhythm, S1 normal, S2 normal and normal heart sounds.  Exam reveals no gallop and no friction rub.   No murmur heard. Pulmonary/Chest: Effort normal. She  has no wheezes. She has no rhonchi. She has no rales.  Lungs clear and good air movement but bronchial breath sounds  Musculoskeletal:  TTP over bilateral rhomboids  Lymphadenopathy:       Head (right side): Tonsillar adenopathy present.       Head (left side): Tonsillar adenopathy present.  Neurological: She is alert.  Skin: Skin is warm and dry.  Psychiatric: She has a normal mood and affect. Her behavior is normal.  Nursing note and vitals reviewed.   Vitals:   01/15/17 1145  BP: 118/78  Pulse: 88  Resp: 18  Temp: 98.1 F (36.7 C)  TempSrc: Oral  SpO2: 100%  Weight: 128 lb 9.6 oz (58.3 kg)  Height: 5\' 3"  (1.6 m)  Body mass index is 22.78 kg/m. Assessment & Plan:   1. Acute recurrent maxillary sinusitis   2. Benign essential HTN   3. Stable  angina (HCC)   4. Cervicalgia   5. Urinary symptom or sign   6. Medication monitoring encounter   7. Vitamin D deficiency   8. Elevated ferritin   9. Anemia, unspecified type   10. Environmental allergies   11. Spasm of thoracic back muscle     Orders Placed This Encounter  Procedures  . Comprehensive metabolic panel  . CBC with Differential/Platelet  . Ferritin  . VITAMIN D 25 Hydroxy (Vit-D Deficiency, Fractures)  . Ambulatory referral to Physical Therapy    Referral Priority:   Routine    Referral Type:   Physical Medicine    Referral Reason:   Specialty Services Required    Requested Specialty:   Physical Therapy    Number of Visits Requested:   1  . POCT urinalysis dipstick  . POCT Microscopic Urinalysis ([facility])    Meds ordered this encounter  Medications  . DISCONTD: rosuvastatin (CRESTOR) 10 MG tablet    Sig: Take 10 mg by mouth daily.  Marland Kitchen DISCONTD: metoprolol succinate (TOPROL-XL) 25 MG 24 hr tablet    Sig: Take 25 mg by mouth daily.  . nitroGLYCERIN (NITROSTAT) 0.4 MG SL tablet    Sig: Place 0.4 mg under the tongue every 5 (five) minutes as needed for chest pain.  . cetirizine (ZYRTEC) 10 MG tablet    Sig:  Take 1 tablet (10 mg total) by mouth at bedtime.    Dispense:  90 tablet    Refill:  3  . montelukast (SINGULAIR) 10 MG tablet    Sig: Take 1 tablet (10 mg total) by mouth at bedtime.    Dispense:  90 tablet    Refill:  3  . metoprolol succinate (TOPROL-XL) 25 MG 24 hr tablet    Sig: Take 1 tablet (25 mg total) by mouth daily.    Dispense:  90 tablet    Refill:  1  . rosuvastatin (CRESTOR) 10 MG tablet    Sig: Take 1 tablet (10 mg total) by mouth daily.    Dispense:  90 tablet    Refill:  1  . lisinopril (PRINIVIL,ZESTRIL) 40 MG tablet    Sig: Take 1 tablet (40 mg total) by mouth daily.    Dispense:  90 tablet    Refill:  0  . baclofen (LIORESAL) 20 MG tablet    Sig: Take 1 tablet (20 mg total) by mouth 4 (four) times daily as needed for muscle spasms.    Dispense:  120 each    Refill:  0  . predniSONE (DELTASONE) 20 MG tablet    Sig: Take 3 tabs qd x 3d, then 2 tabs qd x 3d then 1 tab qd x 3d.    Dispense:  18 tablet    Refill:  0  . azithromycin (ZITHROMAX) 250 MG tablet    Sig: Take 2 tabs PO x 1 dose, then 1 tab PO QD x 4 days    Dispense:  6 tablet    Refill:  0    I personally performed the services described in this documentation, which was scribed in my presence. The recorded information has been reviewed and considered, and addended by me as needed.   Norberto Sorenson, M.D.  Primary Care at [facility] [address] [place], [place] [address] [phone] phone [phone] fax  01/17/17 11:51 PM

## 2017-01-17 LAB — CBC WITH DIFFERENTIAL/PLATELET
BASOS ABS: 0 10*3/uL (ref 0.0–0.2)
Basos: 0 %
EOS (ABSOLUTE): 0.1 10*3/uL (ref 0.0–0.4)
Eos: 2 %
HEMOGLOBIN: 10.7 g/dL — AB (ref 11.1–15.9)
Hematocrit: 34.2 % (ref 34.0–46.6)
IMMATURE GRANS (ABS): 0 10*3/uL (ref 0.0–0.1)
Immature Granulocytes: 0 %
LYMPHS: 46 %
Lymphocytes Absolute: 1.9 10*3/uL (ref 0.7–3.1)
MCH: 24.1 pg — ABNORMAL LOW (ref 26.6–33.0)
MCHC: 31.3 g/dL — AB (ref 31.5–35.7)
MCV: 77 fL — ABNORMAL LOW (ref 79–97)
MONOCYTES: 8 %
Monocytes Absolute: 0.3 10*3/uL (ref 0.1–0.9)
NEUTROS PCT: 44 %
Neutrophils Absolute: 1.8 10*3/uL (ref 1.4–7.0)
PLATELETS: 224 10*3/uL (ref 150–379)
RBC: 4.44 x10E6/uL (ref 3.77–5.28)
RDW: 15.2 % (ref 12.3–15.4)
WBC: 4.1 10*3/uL (ref 3.4–10.8)

## 2017-01-17 LAB — COMPREHENSIVE METABOLIC PANEL
ALK PHOS: 85 IU/L (ref 39–117)
ALT: 32 IU/L (ref 0–32)
AST: 33 IU/L (ref 0–40)
Albumin/Globulin Ratio: 1.4 (ref 1.2–2.2)
Albumin: 4.6 g/dL (ref 3.5–5.5)
BILIRUBIN TOTAL: 0.4 mg/dL (ref 0.0–1.2)
BUN/Creatinine Ratio: 31 — ABNORMAL HIGH (ref 9–23)
BUN: 17 mg/dL (ref 6–24)
CHLORIDE: 102 mmol/L (ref 96–106)
CO2: 25 mmol/L (ref 20–29)
Calcium: 9.9 mg/dL (ref 8.7–10.2)
Creatinine, Ser: 0.55 mg/dL — ABNORMAL LOW (ref 0.57–1.00)
GFR calc Af Amer: 119 mL/min/{1.73_m2} (ref >59)
GFR calc non Af Amer: 103 mL/min/{1.73_m2} (ref >59)
GLUCOSE: 88 mg/dL (ref 65–99)
Globulin, Total: 3.2 g/dL (ref 1.5–4.5)
Potassium: 4.6 mmol/L (ref 3.5–5.2)
Sodium: 140 mmol/L (ref 134–144)
TOTAL PROTEIN: 7.8 g/dL (ref 6.0–8.5)

## 2017-01-17 LAB — FERRITIN: FERRITIN: 315 ng/mL — AB (ref 15–150)

## 2017-01-17 LAB — VITAMIN D 25 HYDROXY (VIT D DEFICIENCY, FRACTURES): VIT D 25 HYDROXY: 18.4 ng/mL — AB (ref 30.0–100.0)

## 2017-01-22 ENCOUNTER — Encounter: Payer: Self-pay | Admitting: Family Medicine

## 2017-01-22 ENCOUNTER — Ambulatory Visit (INDEPENDENT_AMBULATORY_CARE_PROVIDER_SITE_OTHER): Payer: BLUE CROSS/BLUE SHIELD | Admitting: Family Medicine

## 2017-01-22 VITALS — BP 121/71 | HR 74 | Temp 98.2°F | Resp 20 | Ht 61.75 in | Wt 130.4 lb

## 2017-01-22 DIAGNOSIS — R519 Headache, unspecified: Secondary | ICD-10-CM

## 2017-01-22 DIAGNOSIS — R531 Weakness: Secondary | ICD-10-CM | POA: Diagnosis not present

## 2017-01-22 DIAGNOSIS — R7989 Other specified abnormal findings of blood chemistry: Secondary | ICD-10-CM

## 2017-01-22 DIAGNOSIS — R51 Headache: Secondary | ICD-10-CM

## 2017-01-22 DIAGNOSIS — M791 Myalgia, unspecified site: Secondary | ICD-10-CM

## 2017-01-22 DIAGNOSIS — E559 Vitamin D deficiency, unspecified: Secondary | ICD-10-CM | POA: Diagnosis not present

## 2017-01-22 MED ORDER — HYDROCODONE-ACETAMINOPHEN 5-325 MG PO TABS: 1.0000 | ORAL_TABLET | Freq: Four times a day (QID) | ORAL | 0 refills | Status: DC | PRN

## 2017-01-22 MED ORDER — VITAMIN D (ERGOCALCIFEROL) 1.25 MG (50000 UNIT) PO CAPS: 50000.0000 [IU] | ORAL_CAPSULE | ORAL | 1 refills | Status: DC

## 2017-01-22 NOTE — Patient Instructions (Addendum)
We recommend that you schedule a mammogram for breast cancer screening. Typically, you do not need a referral to do this. Please contact a local imaging center to schedule your mammogram. [facility]) - [phone] or [phone]) [phone]    IF you received an x-ray today, you will receive an invoice from [facility]. Please contact [facility] at [phone] with questions or concerns regarding your invoice.   IF you received labwork today, you will receive an invoice from [facility]. Please contact [facility] at [phone] with questions or concerns regarding your invoice.   Our billing staff will not be able to assist you with questions regarding bills from these companies.  You will be contacted with the lab results as soon as they are available. The fastest way to get your results is to activate your My Chart account. Instructions are located on the last page of this paperwork. If you have not heard from [place] regarding the results in 2 weeks, please contact this office.     Muscle Pain, Adult Muscle pain (myalgia) may be mild or severe. In most cases, the pain lasts only a short time and it goes away without treatment. It is normal to feel some muscle pain after starting a workout program. Muscles that have not been used often will be sore at first. Muscle pain may also be caused by many other things, including:  Overuse or muscle strain, especially if you are not in shape. This is the most common cause of muscle pain.  Injury.  Bruises.  Viruses, such as the flu.  Infectious diseases.  A chronic condition that causes muscle tenderness, fatigue, and headache (fibromyalgia).  A condition, such as lupus, in which the body's disease-fighting system attacks other organs in the body (autoimmune or rheumatologic diseases).  Certain drugs, including ACE inhibitors and statins.  To diagnose the cause of your muscle pain, your health care  provider will do a physical exam and ask questions about the pain and when it began. If you have not had muscle pain for very long, your health care provider may want to wait before doing much testing. If your muscle pain has lasted a long time, your health care provider may want to run tests right away. In some cases, this may include tests to rule out certain conditions or illnesses. Treatment for muscle pain depends on the cause. Home care is often enough to relieve muscle pain. Your health care provider may also prescribe anti-inflammatory medicine. Follow these instructions at home: Activity  If overuse is causing your muscle pain: ? Slow down your activities until the pain goes away. ? Do regular, gentle exercises if you are not usually active. ? Warm up before exercising. Stretch before and after exercising. This can help lower the risk of muscle pain.  Do not continue working out if the pain is very bad. Bad pain could mean that you have injured a muscle. Managing pain and discomfort   If directed, apply ice to the sore muscle: ? Put ice in a plastic bag. ? Place a towel between your skin and the bag. ? Leave the ice on for 20 minutes, 2-3 times a day.  You may also alternate between applying ice and applying heat as told by your health care provider. To apply heat, use the heat source that your health care provider recommends, such as a moist heat pack or a heating pad. ? Place a towel between your skin and the heat source. ? Leave  the heat on for 20-30 minutes. ? Remove the heat if your skin turns bright red. This is especially important if you are unable to feel pain, heat, or cold. You may have a greater risk of getting burned. Medicines  Take over-the-counter and prescription medicines only as told by your health care provider.  Do not drive or use heavy machinery while taking prescription pain medicine. Contact a health care provider if:  Your muscle pain gets worse and  medicines do not help.  You have muscle pain that lasts longer than 3 days.  You have a rash or fever along with muscle pain.  You have muscle pain after a tick bite.  You have muscle pain while working out, even though you are in good physical condition.  You have redness, soreness, or swelling along with muscle pain.  You have muscle pain after starting a new medicine or changing the dose of a medicine. Get help right away if:  You have trouble breathing.  You have trouble swallowing.  You have muscle pain along with a stiff neck, fever, and vomiting.  You have severe muscle weakness or cannot move part of your body. This information is not intended to replace advice given to you by your health care provider. Make sure you discuss any questions you have with your health care provider. Document Released: 04/08/2006 Document Revised: 12/05/2015 Document Reviewed: 10/07/2015 Elsevier Interactive Patient Education  2018 [employer].

## 2017-01-22 NOTE — Progress Notes (Signed)
By signing my name below, I, Mesha Guinyard, attest that this documentation has been prepared under the direction and in the presence of Norberto Sorenson, MD.  Electronically Signed: Arvilla Market, Medical Scribe. 01/22/17. 3:19 PM.  Subjective:    Patient ID: Diana [name], female    DOB: June 15, 1955, 61 y.o.   MRN: [id number]  HPI Chief Complaint  Patient presents with  . Follow-up    Neck and Shoulder Pain-Medication not working    HPI Comments: Diana [name] is a 61 y.o. female who presents to Primary Care at [facility] for follow-up. Seen weeks ago with 9 day 0 mg taper. Tried baclofen 20 mg QID, labs were checked with persistently elevated ferritin and a low Vit D. Iron studies 3 months ago were nl - on low end of nl range. Inflammatory levels were nl 2 years prior.  Pt discontinued them since one of them caused HA, and her bp was elevated while on it - measuring around 160/98. She can't remember which one it was but while on it she can't function at work. Pt discontinued abx because she wasn't sure if it was causing her HAs. Crestor was started 1 month ago 7/26.  Reports HA all over her head, her back hurts when she breaths, and she cough a little at night. She also mentions shoulder pain, weakness below the knee, and stumbling. She located radiating pain from the center dorsum of her left ankle up her knee -  She used to have radiating calf pain from ankle to back of knee. Denies fever, N/V, phonophobia, photophobia, and chills.  Patient Active Problem List   Diagnosis Date Noted  . Unstable angina (HCC) 10/30/2016  . Non-ST elevation (NSTEMI) myocardial infarction (HCC) 10/30/2016  . Bronchitis 10/30/2016  . NSTEMI (non-ST elevated myocardial infarction) (HCC) 10/30/2016  . Carpal tunnel syndrome 12/18/2015  . Unilateral osteoarthritis of first carpometacarpal (CMC) joint 12/18/2015  . Cervicalgia 12/18/2015  . Benign essential HTN 04/08/2015  . Metatarsalgia of right foot 02/11/2015    . Degeneration of intervertebral disc of lumbar region 12/18/2014  . Acid reflux 12/18/2014   Past Medical History:  Diagnosis Date  . Allergy   . Arthritis    feet  . Carpal tunnel syndrome    Past Surgical History:  Procedure Laterality Date  . CARPAL TUNNEL RELEASE Left 02/12/2016   Procedure: LEFT CARPAL TUNNEL RELEASE;  Surgeon: Cindee Salt, MD;  Location: [facility];  Service: Orthopedics;  Laterality: Left;  FAB  . TONSILLECTOMY    . TUBAL LIGATION    . WRIST SURGERY Right    Allergies  Allergen Reactions  . Penicillins Rash    Has patient had a PCN reaction causing immediate rash, facial/tongue/throat swelling, SOB or lightheadedness with hypotension: yes Has patient had a PCN reaction causing severe rash involving mucus membranes or skin necrosis:  no Has patient had a PCN reaction that required hospitalization:  no Has patient had a PCN reaction occurring within the last 10 years: no If all of the above answers are "NO", then may proceed with Cephalosporin use.   . Codeine Anxiety   Prior to Admission medications   Medication Sig Start Date End Date Taking? Authorizing Provider  aspirin EC 81 MG EC tablet Take 1 tablet (81 mg total) by mouth daily. 11/02/16  Yes Berton Bon, NP  cetirizine (ZYRTEC) 10 MG tablet Take 1 tablet (10 mg total) by mouth at bedtime. 01/15/17  Yes Sherren Mocha, MD  fluticasone (  FLONASE) 50 MCG/ACT nasal spray Place 2 sprays into both nostrils daily. 12/06/16  Yes Sherren Mocha, MD  lisinopril (PRINIVIL,ZESTRIL) 40 MG tablet Take 1 tablet (40 mg total) by mouth daily. 01/15/17  Yes Sherren Mocha, MD  metoprolol succinate (TOPROL-XL) 25 MG 24 hr tablet Take 1 tablet (25 mg total) by mouth daily. 01/15/17  Yes Sherren Mocha, MD  montelukast (SINGULAIR) 10 MG tablet Take 1 tablet (10 mg total) by mouth at bedtime. 01/15/17  Yes Sherren Mocha, MD  nitroGLYCERIN (NITROSTAT) 0.4 MG SL tablet Place 0.4 mg under the tongue every 5 (five) minutes as  needed for chest pain.   Yes [provider]  rosuvastatin (CRESTOR) 10 MG tablet Take 1 tablet (10 mg total) by mouth daily. 01/15/17  Yes Sherren Mocha, MD  azithromycin (ZITHROMAX) 250 MG tablet Take 2 tabs PO x 1 dose, then 1 tab PO QD x 4 days Patient not taking: Reported on 01/22/2017 01/15/17   Sherren Mocha, MD  baclofen (LIORESAL) 20 MG tablet Take 1 tablet (20 mg total) by mouth 4 (four) times daily as needed for muscle spasms. Patient not taking: Reported on 01/22/2017 01/15/17   Sherren Mocha, MD  predniSONE (DELTASONE) 20 MG tablet Take 3 tabs qd x 3d, then 2 tabs qd x 3d then 1 tab qd x 3d. Patient not taking: Reported on 01/22/2017 01/15/17   Sherren Mocha, MD   Social History   Social History  . Marital status: Divorced    Spouse name: Alcario Drought  . Number of children: 2  . Years of education: 12th grade   Occupational History  . floor tech    Social History Main Topics  . Smoking status: Never Smoker  . Smokeless tobacco: Never Used  . Alcohol use No  . Drug use: No  . Sexual activity: No   Other Topics Concern  . Not on file   Social History Narrative   Divorced from her husband. She has two adult sons from that union . One lives in [place], the other lives in [place] (as a Solicitor) following Office manager.   Married Alcario Drought 05/2014.   Family History  Problem Relation Age of Onset  . Thyroid disease Mother   . Brain cancer Sister    Depression screen [facility] 2/9 01/15/2017 10/29/2016 10/12/2016 09/17/2016 08/14/2016  Decreased Interest 0 0 0 0 0  Down, Depressed, Hopeless 0 0 0 0 0  PHQ - 2 Score 0 0 0 0 0    Review of Systems  Constitutional: Negative for chills and fever.  Eyes: Negative for photophobia.  Respiratory: Positive for cough.   Gastrointestinal: Negative for nausea and vomiting.  Musculoskeletal: Positive for arthralgias, back pain and gait problem.  Neurological: Positive for weakness and headaches.   Objective:  Physical Exam  Constitutional: She appears  well-developed and well-nourished. No distress.  HENT:  Head: Normocephalic and atraumatic.  Right Ear: Tympanic membrane normal.  Left Ear: Tympanic membrane normal.  Nose: Nose normal.  Mouth/Throat: Oropharynx is clear and moist.  Eyes: Conjunctivae are normal.  Neck: Neck supple. No thyroid mass and no thyromegaly present.  Cardiovascular: Normal rate and regular rhythm.  Exam reveals no gallop and no friction rub.   Murmur heard.  Systolic (Ejection, Left lower sternal border) murmur is present with a grade of 2/6  Pulmonary/Chest: Effort normal and breath sounds normal. No respiratory distress. She has no wheezes. She has no rales.  Lymphadenopathy:  She has no cervical adenopathy.  Neurological: She is alert.  Skin: Skin is warm and dry.  Psychiatric: She has a normal mood and affect. Her behavior is normal.  Nursing note and vitals reviewed.   Vitals:   01/22/17 1508  BP: 121/71  Pulse: 74  Resp: 20  Temp: 98.2 F (36.8 C)  TempSrc: Oral  SpO2: 98%  Weight: 130 lb 6 oz (59.1 kg)  Height: 5' 1.75" (1.568 m)   Body mass index is 24.04 kg/m.   Results for orders placed or performed in visit on 01/15/17  Comprehensive metabolic panel  Result Value Ref Range   Glucose 88 65 - 99 mg/dL   BUN 17 6 - 24 mg/dL   Creatinine, Ser 9.56 (L) 0.57 - 1.00 mg/dL   GFR calc non Af Amer 103 >59 mL/min/1.73   GFR calc Af Amer 119 >59 mL/min/1.73   BUN/Creatinine Ratio 31 (H) 9 - 23   Sodium 140 134 - 144 mmol/L   Potassium 4.6 3.5 - 5.2 mmol/L   Chloride 102 96 - 106 mmol/L   CO2 25 20 - 29 mmol/L   Calcium 9.9 8.7 - 10.2 mg/dL   Total Protein 7.8 6.0 - 8.5 g/dL   Albumin 4.6 3.5 - 5.5 g/dL   Globulin, Total 3.2 1.5 - 4.5 g/dL   Albumin/Globulin Ratio 1.4 1.2 - 2.2   Bilirubin Total 0.4 0.0 - 1.2 mg/dL   Alkaline Phosphatase 85 39 - 117 IU/L   AST 33 0 - 40 IU/L   ALT 32 0 - 32 IU/L  CBC with Differential/Platelet  Result Value Ref Range   WBC 4.1 3.4 - 10.8 x10E3/uL     RBC 4.44 3.77 - 5.28 x10E6/uL   Hemoglobin 10.7 (L) 11.1 - 15.9 g/dL   Hematocrit 21.3 08.6 - 46.6 %   MCV 77 (L) 79 - 97 fL   MCH 24.1 (L) 26.6 - 33.0 pg   MCHC 31.3 (L) 31.5 - 35.7 g/dL   RDW 57.8 46.9 - 62.9 %   Platelets 224 150 - 379 x10E3/uL   Neutrophils 44 Not Estab. %   Lymphs 46 Not Estab. %   Monocytes 8 Not Estab. %   Eos 2 Not Estab. %   Basos 0 Not Estab. %   Neutrophils Absolute 1.8 1.4 - 7.0 x10E3/uL   Lymphocytes Absolute 1.9 0.7 - 3.1 x10E3/uL   Monocytes Absolute 0.3 0.1 - 0.9 x10E3/uL   EOS (ABSOLUTE) 0.1 0.0 - 0.4 x10E3/uL   Basophils Absolute 0.0 0.0 - 0.2 x10E3/uL   Immature Granulocytes 0 Not Estab. %   Immature Grans (Abs) 0.0 0.0 - 0.1 x10E3/uL  Ferritin  Result Value Ref Range   Ferritin 315 (H) 15 - 150 ng/mL  VITAMIN D 25 Hydroxy (Vit-D Deficiency, Fractures)  Result Value Ref Range   Vit D, 25-Hydroxy 18.4 (L) 30.0 - 100.0 ng/mL  POCT urinalysis dipstick  Result Value Ref Range   Color, UA yellow yellow   Clarity, UA clear clear   Glucose, UA negative negative mg/dL   Bilirubin, UA negative negative   Ketones, POC UA negative negative mg/dL   Spec Grav, UA 5.284 1.324 - 1.025   Blood, UA trace-intact (A) negative   pH, UA 6.5 5.0 - 8.0   Protein Ur, POC =30 (A) negative mg/dL   Urobilinogen, UA 0.2 0.2 or 1.0 E.U./dL   Nitrite, UA Negative Negative   Leukocytes, UA Negative Negative  POCT Microscopic Urinalysis ([facility])  Result Value Ref Range  WBC,UR,HPF,POC None None WBC/hpf   RBC,UR,HPF,POC Few (A) None RBC/hpf   Bacteria Few (A) None, Too numerous to count   Mucus Absent Absent   Epithelial Cells, UR Per Microscopy None None, Too numerous to count cells/hpf   Assessment & Plan:   1. Elevated ferritin   2. Vitamin D deficiency   3. Weakness   4. Acute nonintractable headache, unspecified headache type   5. Myalgia     Orders Placed This Encounter  Procedures  . Sedimentation Rate  . Uric A+ANA+RA Qn+CRP+ASO  . CK     Meds ordered this encounter  Medications  . Vitamin D, Ergocalciferol, (DRISDOL) 50000 units CAPS capsule    Sig: Take 1 capsule (50,000 Units total) by mouth every 7 (seven) days.    Dispense:  12 capsule    Refill:  1  . HYDROcodone-acetaminophen (NORCO/VICODIN) 5-325 MG tablet    Sig: Take 1 tablet by mouth every 6 (six) hours as needed for moderate pain.    Dispense:  20 tablet    Refill:  0    I personally performed the services described in this documentation, which was scribed in my presence. The recorded information has been reviewed and considered, and addended by me as needed.   Norberto Sorenson, M.D.  Primary Care at [facility] [address] [place], [place] [address] [phone] phone [phone] fax  01/24/17 10:24 PM

## 2017-01-24 LAB — CK: Total CK: 125 U/L (ref 24–173)

## 2017-01-24 LAB — SEDIMENTATION RATE: SED RATE: 29 mm/h (ref 0–40)

## 2017-01-24 LAB — URIC A+ANA+RA QN+CRP+ASO
ASO: 82 IU/mL (ref 0.0–200.0)
Anti Nuclear Antibody(ANA): NEGATIVE
CRP: 0.4 mg/L (ref 0.0–4.9)
URIC ACID: 3.7 mg/dL (ref 2.5–7.1)

## 2017-03-16 ENCOUNTER — Telehealth: Payer: Self-pay

## 2017-03-16 NOTE — Telephone Encounter (Signed)
Called patient to review overdue health maintenance.  She states she did cologuard instead of colonoscopy.  She has had everything she needs except for PAP.  She has not had money for her copay to get it done. I looked in her chart and it is ordered already so all she has to do is call.  She said she would after she sees Dr. Clelia CroftShaw again.

## 2017-03-18 ENCOUNTER — Encounter: Payer: Self-pay | Admitting: Physician Assistant

## 2017-03-18 ENCOUNTER — Ambulatory Visit (INDEPENDENT_AMBULATORY_CARE_PROVIDER_SITE_OTHER): Payer: BLUE CROSS/BLUE SHIELD | Admitting: Physician Assistant

## 2017-03-18 VITALS — BP 130/78 | HR 90 | Temp 98.3°F | Resp 18 | Ht 61.75 in | Wt 132.4 lb

## 2017-03-18 DIAGNOSIS — M79672 Pain in left foot: Secondary | ICD-10-CM | POA: Diagnosis not present

## 2017-03-18 DIAGNOSIS — R0989 Other specified symptoms and signs involving the circulatory and respiratory systems: Secondary | ICD-10-CM

## 2017-03-18 DIAGNOSIS — M542 Cervicalgia: Secondary | ICD-10-CM

## 2017-03-18 DIAGNOSIS — E559 Vitamin D deficiency, unspecified: Secondary | ICD-10-CM | POA: Diagnosis not present

## 2017-03-18 MED ORDER — AZELASTINE HCL 0.1 % NA SOLN: 2.0000 | Freq: Two times a day (BID) | NASAL | 0 refills | Status: DC

## 2017-03-18 MED ORDER — TRAMADOL HCL 50 MG PO TABS: 50.0000 mg | ORAL_TABLET | Freq: Three times a day (TID) | ORAL | 0 refills | Status: DC | PRN

## 2017-03-18 NOTE — Patient Instructions (Signed)
Wear the post-op shoe when you are up and about. You do not need to wear it in bed, showering, etc. Elevate your foot whenever you can. Apply ice to the area of pain for 15-20 minutes twice a day.   IF you received an x-ray today, you will receive an invoice from [facility]. Please contact [facility] at [phone] with questions or concerns regarding your invoice.   IF you received labwork today, you will receive an invoice from [facility]. Please contact [facility] at [phone] with questions or concerns regarding your invoice.   Our billing staff will not be able to assist you with questions regarding bills from these companies.  You will be contacted with the lab results as soon as they are available. The fastest way to get your results is to activate your My Chart account. Instructions are located on the last page of this paperwork. If you have not heard from us regarding the results in 2 weeks, please contact this office.

## 2017-03-18 NOTE — Progress Notes (Signed)
Patient ID: Diana [name], female    DOB: 1956-03-10, 61 y.o.   MRN: [id number]  PCP: Sherren Mocha, MD  Chief Complaint  Patient presents with  . Sinus Problem    x2 week, pt states sinus pressure and headache  . Neck and shoulder pain    pt states pain is off and on and she is in therapy.  . Foot Pain    left foot, pt states a book fell on her foot  . Medication Refill    Drisdol [id number]    Subjective:   Presents for evaluation of several issues.  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 Thinks that the paint fumes and dust are aggravating her sinus symptoms. Uses saline nasal spray, montelukast and cetirizine. Has headache, facial pressure and pain. A little bit congested. Mild cough at night. "Hope to catch it" before she develops bronchitis.  The neck and back pain persist, and she is in PT for them. Hydrocodone caused lightheadedness, nausea and elevated BP. Isn't taking the baclofen, wasn't sure if it was causing the symptoms she attributed to the hydrocodone.  Dropped a book on her LEFT foot last week.  Pain extends from the great toe up into the ankle. Initially had redness, can't tell if it's swollen. Hurts to bear weight and move the foot, and describes numbness/tingling across the top of the foot.  Based on chart review, she has a refill of the Vitamin D supplement at the pharmacy.   Review of Systems As above. No fever, chills, nausea, vomiting or diarrhea. No CP, SOB.   Patient Active Problem List   Diagnosis Date Noted  . Unstable angina (HCC) 10/30/2016  . Non-ST elevation (NSTEMI) myocardial infarction (HCC) 10/30/2016  . Bronchitis 10/30/2016  . NSTEMI (non-ST elevated myocardial infarction) (HCC) 10/30/2016  . Carpal tunnel syndrome 12/18/2015  . Unilateral osteoarthritis of first carpometacarpal (CMC) joint 12/18/2015  . Cervicalgia 12/18/2015  . Benign essential HTN 04/08/2015  . Metatarsalgia of right foot 02/11/2015  .  Degeneration of intervertebral disc of lumbar region 12/18/2014  . Acid reflux 12/18/2014     Prior to Admission medications   Medication Sig Start Date End Date Taking? Authorizing Provider  aspirin EC 81 MG EC tablet Take 1 tablet (81 mg total) by mouth daily. 11/02/16  Yes Berton Bon, NP  cetirizine (ZYRTEC) 10 MG tablet Take 1 tablet (10 mg total) by mouth at bedtime. 01/15/17  Yes Sherren Mocha, MD  fluticasone [facility]) 50 MCG/ACT nasal spray Place 2 sprays into both nostrils daily. 12/06/16  Yes Sherren Mocha, MD  lisinopril (PRINIVIL,ZESTRIL) 40 MG tablet Take 1 tablet (40 mg total) by mouth daily. 01/15/17  Yes Sherren Mocha, MD  metoprolol succinate (TOPROL-XL) 25 MG 24 hr tablet Take 1 tablet (25 mg total) by mouth daily. 01/15/17  Yes Sherren Mocha, MD  montelukast (SINGULAIR) 10 MG tablet Take 1 tablet (10 mg total) by mouth at bedtime. 01/15/17  Yes Sherren Mocha, MD  nitroGLYCERIN (NITROSTAT) 0.4 MG SL tablet Place 0.4 mg under the tongue every 5 (five) minutes as needed for chest pain.   Yes [provider]  rosuvastatin (CRESTOR) 10 MG tablet Take 1 tablet (10 mg total) by mouth daily. 01/15/17  Yes Sherren Mocha, MD  Vitamin D, Ergocalciferol, (DRISDOL) 50000 units CAPS capsule Take 1 capsule (50,000 Units total) by mouth every 7 (seven) days. 01/22/17  Yes Sherren Mocha, MD  Allergies  Allergen Reactions  . Penicillins Rash    Has patient had a PCN reaction causing immediate rash, facial/tongue/throat swelling, SOB or lightheadedness with hypotension: yes Has patient had a PCN reaction causing severe rash involving mucus membranes or skin necrosis:  no Has patient had a PCN reaction that required hospitalization:  no Has patient had a PCN reaction occurring within the last 10 years: no If all of the above answers are "NO", then may proceed with Cephalosporin use.   . Codeine Anxiety       Objective:  Physical Exam  Constitutional: She is oriented to person, place, and  time. She appears well-developed and well-nourished. She is active and cooperative. No distress.  BP 130/78 (BP Location: Left Arm, Patient Position: Sitting, Cuff Size: Normal)   Pulse 90   Temp 98.3 F (36.8 C) (Oral)   Resp 18   Ht 5' 1.75" (1.568 m)   Wt 132 lb 6.4 oz (60.1 kg)   SpO2 97%   BMI 24.41 kg/m   HENT:  Head: Normocephalic and atraumatic.  Right Ear: Hearing and external ear normal.  Left Ear: Hearing and external ear normal.  Nose: No mucosal edema or rhinorrhea. Right sinus exhibits maxillary sinus tenderness and frontal sinus tenderness. Left sinus exhibits maxillary sinus tenderness and frontal sinus tenderness.  Mouth/Throat: Oropharynx is clear and moist and mucous membranes are normal. No oral lesions. No uvula swelling. No oropharyngeal exudate.  Eyes: Conjunctivae are normal. No scleral icterus.  Neck: Normal range of motion. Neck supple. No thyromegaly present.  Cardiovascular: Normal rate, regular rhythm and normal heart sounds.   Pulses:      Radial pulses are 2+ on the right side, and 2+ on the left side.  Pulmonary/Chest: Effort normal and breath sounds normal.  Lymphadenopathy:       Head (right side): No tonsillar, no preauricular, no posterior auricular and no occipital adenopathy present.       Head (left side): No tonsillar, no preauricular, no posterior auricular and no occipital adenopathy present.    She has no cervical adenopathy.       Right: No supraclavicular adenopathy present.       Left: No supraclavicular adenopathy present.  Neurological: She is alert and oriented to person, place, and time. No sensory deficit.  Skin: Skin is warm, dry and intact. No rash noted. No cyanosis or erythema. Nails show no clubbing.  Psychiatric: She has a normal mood and affect. Her speech is normal and behavior is normal.           Assessment & Plan:   1. Sinus complaint Supportive care.  Anticipatory guidance.  RTC if symptoms worsen/persist. -  azelastine (ASTELIN) 0.1 % nasal spray; Place 2 sprays into both nostrils 2 (two) times daily. Use in each nostril as directed  Dispense: 30 mL; Refill: 0  2. Neck pain Trial of tramadol, given her intolerance to hydrocodone. Consider adding Baclofen back if needed. - traMADol (ULTRAM) 50 MG tablet; Take 1 tablet (50 mg total) by mouth every 8 (eight) hours as needed.  Dispense: 30 tablet; Refill: 0  3. Vitamin D insufficiency Advised there are refills on file at the pharmacy.  4. Left foot pain Post-op shoe. Rest. Elevate. Trial of tramadol. - Apply other splint - traMADol (ULTRAM) 50 MG tablet; Take 1 tablet (50 mg total) by mouth every 8 (eight) hours as needed.  Dispense: 30 tablet; Refill: 0    Return if symptoms worsen or fail to improve.  Fara Chute, PA-C Primary Care at [facility]

## 2017-04-04 ENCOUNTER — Ambulatory Visit (INDEPENDENT_AMBULATORY_CARE_PROVIDER_SITE_OTHER): Payer: BLUE CROSS/BLUE SHIELD | Admitting: Family Medicine

## 2017-04-04 ENCOUNTER — Ambulatory Visit (INDEPENDENT_AMBULATORY_CARE_PROVIDER_SITE_OTHER): Payer: BLUE CROSS/BLUE SHIELD

## 2017-04-04 ENCOUNTER — Encounter: Payer: Self-pay | Admitting: Family Medicine

## 2017-04-04 ENCOUNTER — Other Ambulatory Visit: Payer: Self-pay

## 2017-04-04 VITALS — BP 118/76 | HR 100 | Temp 98.3°F | Resp 16 | Ht 61.75 in | Wt 133.2 lb

## 2017-04-04 DIAGNOSIS — Z5181 Encounter for therapeutic drug level monitoring: Secondary | ICD-10-CM | POA: Diagnosis not present

## 2017-04-04 DIAGNOSIS — R7989 Other specified abnormal findings of blood chemistry: Secondary | ICD-10-CM

## 2017-04-04 DIAGNOSIS — E559 Vitamin D deficiency, unspecified: Secondary | ICD-10-CM | POA: Diagnosis not present

## 2017-04-04 DIAGNOSIS — M79672 Pain in left foot: Secondary | ICD-10-CM | POA: Diagnosis not present

## 2017-04-04 DIAGNOSIS — S9782XD Crushing injury of left foot, subsequent encounter: Secondary | ICD-10-CM

## 2017-04-04 DIAGNOSIS — M542 Cervicalgia: Secondary | ICD-10-CM

## 2017-04-04 IMAGING — DX DG TOE GREAT 2+V*L*
3 series · 3 of 3 positions shown · non-contrast
Comparison: Left foot [DATE]

CLINICAL DATA: Crush injury of the great toe 3 weeks ago with
persistent numbness, decreased range of motion and decreased
strength at the first MTP joint.

EXAM:
LEFT GREAT TOE

[toe ap]
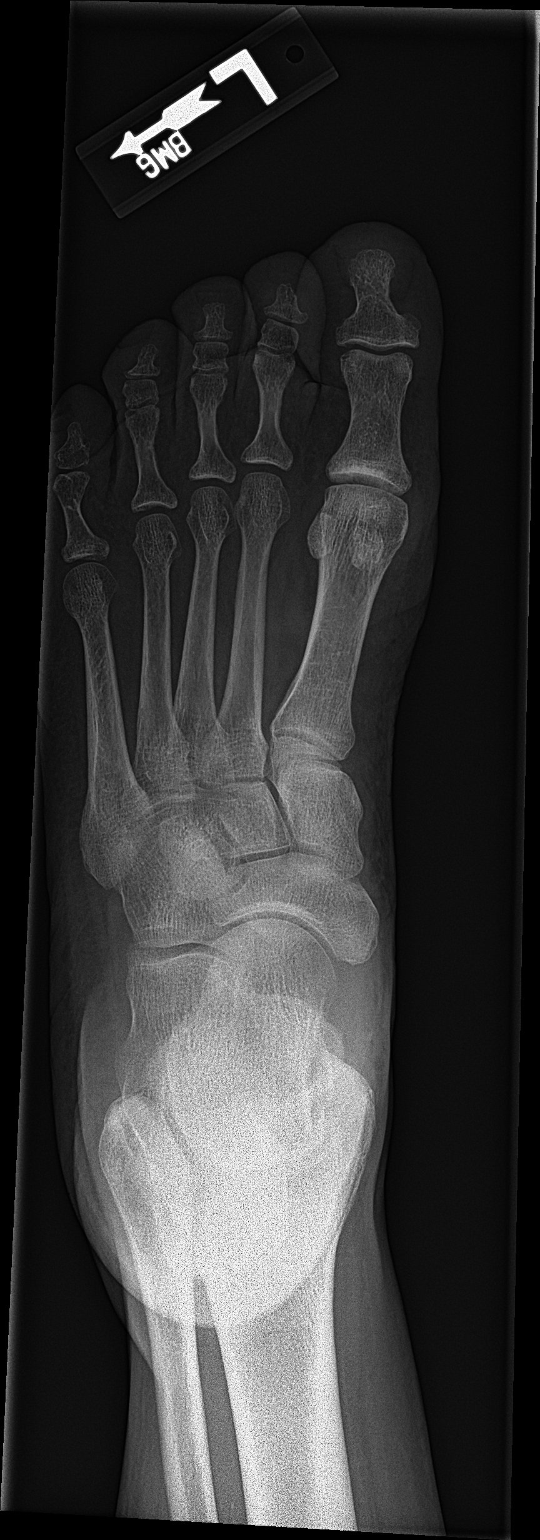

[toe obl]
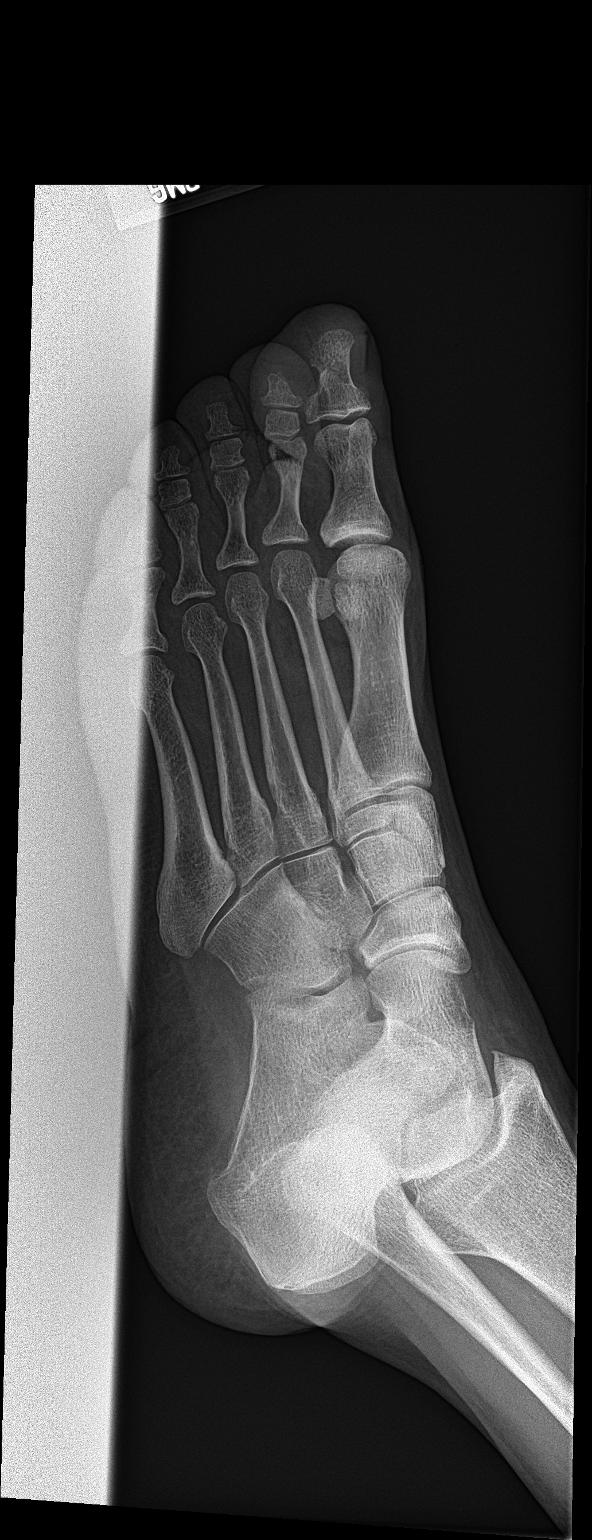

[toe lat]
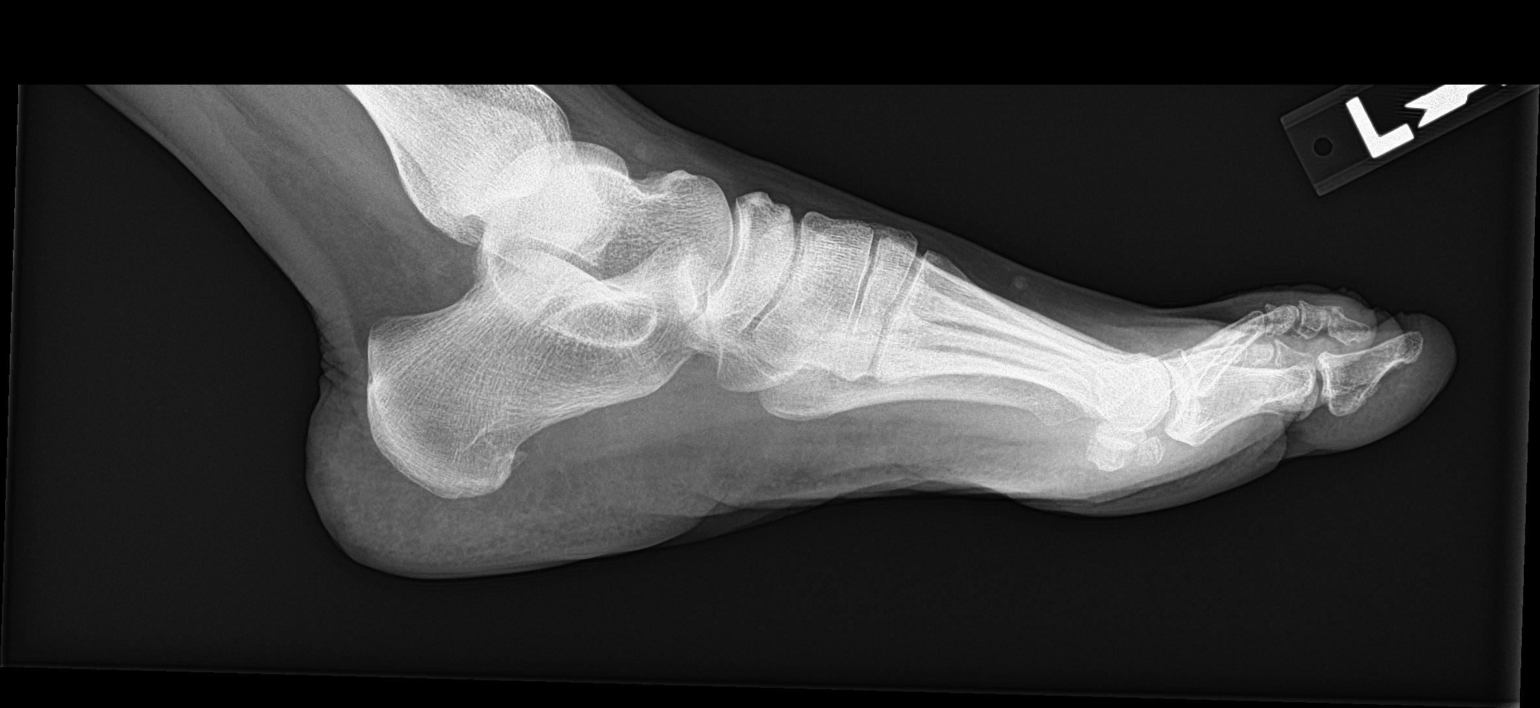

[3 of 3 positions shown; findings below may reference images not displayed]

FINDINGS: The bones are subjectively adequately mineralized. Specific
attention to the first ray reveals no acute or healing fracture.
There is no lytic or blastic lesion. The joint spaces are well
maintained. There is a bipartite medial sesamoid bone at the first
MTP joint which is stable. No fracture or dislocation is observed
within the foot.
IMPRESSION: There is no acute or significant chronic bony abnormality of the
left foot.

## 2017-04-04 MED ORDER — VITAMIN D (ERGOCALCIFEROL) 1.25 MG (50000 UNIT) PO CAPS: 50000.0000 [IU] | ORAL_CAPSULE | ORAL | 1 refills | Status: DC

## 2017-04-04 MED ORDER — ESOMEPRAZOLE MAGNESIUM 40 MG PO CPDR: 40.0000 mg | DELAYED_RELEASE_CAPSULE | Freq: Every day | ORAL | 3 refills | Status: DC

## 2017-04-04 MED ORDER — NITROGLYCERIN 0.4 MG SL SUBL: 0.4000 mg | SUBLINGUAL_TABLET | SUBLINGUAL | 2 refills | Status: DC | PRN

## 2017-04-04 MED ORDER — TRAMADOL HCL 50 MG PO TABS: 50.0000 mg | ORAL_TABLET | Freq: Four times a day (QID) | ORAL | 0 refills | Status: DC | PRN

## 2017-04-04 MED ORDER — LISINOPRIL 40 MG PO TABS: 40.0000 mg | ORAL_TABLET | Freq: Every day | ORAL | 3 refills | Status: DC

## 2017-04-04 NOTE — Patient Instructions (Addendum)
IF you received an x-ray today, you will receive an invoice from [facility]. Please contact [facility] at [phone] with questions or concerns regarding your invoice.   IF you received labwork today, you will receive an invoice from [facility]. Please contact [facility] at [phone] with questions or concerns regarding your invoice.   Our billing staff will not be able to assist you with questions regarding bills from these companies.  You will be contacted with the lab results as soon as they are available. The fastest way to get your results is to activate your My Chart account. Instructions are located on the last page of this paperwork. If you have not heard from [place] regarding the results in 2 weeks, please contact this office.      Crush Injury of the Foot When a crush injury of the foot occurs, many structures within the foot and ankle joint can be affected. This can result in a complicated injury that may involve:  One or more broken (fractured) bones.  Lacerations or abrasions of the skin. These increase your risk of infection.  Compressed or torn muscles.  Torn ligaments and tendons.  Broken blood vessels, causing bleeding within the tissues. This can lead to dangerously high pressure within the tissues (compartment syndrome).  Damage to nerves.  One or more toe amputations.  What are the causes? This type of injury can happen when a great amount of force is suddenly applied to the foot. This might occur:  During a motor vehicle accident.  If the foot is pulled into a machine during industrial or agricultural work.  If a heavy load falls directly onto the foot.  What are the signs or symptoms? Symptoms will vary depending on which structures in your foot have been injured. Symptoms may include:  Moderate or severe pain in the foot, ankle, or leg.  Bleeding at the site of injury.  Tingling, numbness, or loss of feeling (sensation) in  part or all of your foot.  Loss of movement in part or all of your foot.  How is this diagnosed? Your health care provider will examine you and ask questions about how your injury happened. The exam may include checking for sensation and blood flow into your foot. You may also have tests, including X-rays and procedures to check the pressure in your foot. After initial treatment, additional studies may be done to further diagnose the extent of your injuries. These may include:  A nerve conduction study to determine how well the nerves are working in your leg and foot.  MRI to determine if other injuries occurred that do not usually show up on X-ray.  How is this treated? Treatment for this condition depends on the severity of your crush injury. Treatment may include:  Thorough cleaning if you have an open wound. This may or may not require surgery.  Having a splint applied to your foot or leg.  Medicine to relieve pain.  Antibiotic medicine to prevent infection.  Stitches (sutures) to close open wounds.  One or more surgeries to address injuries to skin, bones, joints, tendons, ligaments, muscles, nerves, or blood vessels.  Follow these instructions at home: If you have a splint:  Wear the splint as told by your health care provider. Remove it only as told by your health care provider.  Loosen the splint if your toes tingle, become numb, or turn cold and blue.  Do not let your splint get wet if it is not waterproof.  Keep the splint clean. Wound care   If you have any skin wounds that were covered with bandages (dressings), follow instructions from your health care provider about how to take care of your wound. Make sure you: ? Wash your hands with soap and water before you change your dressing. If soap and water are not available, use hand sanitizer. ? Change your dressing as told by your health care provider. ? Leave stitches (sutures), skin glue, or adhesive strips in  place. These skin closures may need to stay in place for 2 weeks or longer. If adhesive strip edges start to loosen and curl up, you may trim the loose edges. Do not remove adhesive strips completely unless your health care provider tells you to do that.  If you have skin wounds, check them every day for signs of infection. Check for: ? More redness, swelling, or pain. ? More fluid or blood. ? Warmth. ? Pus or a bad smell. Managing pain, stiffness, and swelling  If directed, put ice on the injured area. ? Put ice in a plastic bag. ? Place a towel between your skin and the bag. ? Leave the ice on for 20 minutes, 2-3 times a day.  Raise (elevate) the injured area above the level of your heart while you are sitting or lying down. Driving  Do not drive or operate heavy machinery while taking prescription pain medicine.  Ask your health care provider when it is safe to drive if you have a splint on your foot or leg. Activity  Return to your normal activities as told by your health care provider. Ask your health care provider what activities are safe for you.  Work with a physical therapist (PT) or occupational therapist (OT) as told by your health care provider. General instructions  Do not put pressure on any part of the splint until it is fully hardened. This may take several hours.  If you have a splint and it is not waterproof, cover it with a watertight plastic bag when you take a bath or a shower.  Take over-the-counter and prescription medicines only as told by your health care provider.  If you were prescribed an antibiotic, take it as told by your health care provider. Do not stop taking the antibiotic before the prescription is done.  Do not use any tobacco products, such as cigarettes, chewing tobacco, and e-cigarettes. Tobacco can delay bone healing. If you need help quitting, ask your health care provider.  Keep all follow-up visits as told by your health care provider.  This is important. These include PT and OT visits. Contact a health care provider if:  A wound that was sutured opens up.  You have more redness, swelling, or pain in your foot.  You have more fluid or blood coming from your foot.  Your foot feels warm to the touch.  You have pus or a bad smell coming from your foot.  You have a fever. Get help right away if:  You suddenly develop severe pain in your foot.  You previously had sensation in your foot and you suddenly lose sensation.  Your symptoms had improved and they suddenly get worse.  Your foot or toes are turning pink or blue. This information is not intended to replace advice given to you by your health care provider. Make sure you discuss any questions you have with your health care provider. Document Released: 04/28/2015 Document Revised: 10/23/2015 Document Reviewed: 01/08/2015 Elsevier Interactive Patient Education  2018 Elsevier  Inc.  Vitamin D Deficiency Vitamin D deficiency is when your body does not have enough vitamin D. Vitamin D is important to your body for many reasons:  It helps the body to absorb two important minerals, called calcium and phosphorus.  It plays a role in bone health.  It may help to prevent some diseases, such as diabetes and multiple sclerosis.  It plays a role in muscle function, including heart function.  You can get vitamin D by:  Eating foods that naturally contain vitamin D.  Eating or drinking milk or other dairy products that have vitamin D added to them.  Taking a vitamin D supplement or a multivitamin supplement that contains vitamin D.  Being in the sun. Your body naturally makes vitamin D when your skin is exposed to sunlight. Your body changes the sunlight into a form of the vitamin that the body can use.  If vitamin D deficiency is severe, it can cause a condition in which your bones become soft. In adults, this condition is called osteomalacia. In children, this  condition is called rickets. What are the causes? Vitamin D deficiency may be caused by:  Not eating enough foods that contain vitamin D.  Not getting enough sun exposure.  Having certain digestive system diseases that make it difficult for your body to absorb vitamin D. These diseases include Crohn disease, chronic pancreatitis, and cystic fibrosis.  Having a surgery in which a part of the stomach or a part of the small intestine is removed.  Being obese.  Having chronic kidney disease or liver disease.  What increases the risk? This condition is more likely to develop in:  Older people.  People who do not spend much time outdoors.  People who live in a long-term care facility.  People who have had broken bones.  People with weak or thin bones (osteoporosis).  People who have a disease or condition that changes how the body absorbs vitamin D.  People who have dark skin.  People who take certain medicines, such as steroid medicines or certain seizure medicines.  People who are overweight or obese.  What are the signs or symptoms? In mild cases of vitamin D deficiency, there may not be any symptoms. If the condition is severe, symptoms may include:  Bone pain.  Muscle pain.  Falling often.  Broken bones caused by a minor injury.  How is this diagnosed? This condition is usually diagnosed with a blood test. How is this treated? Treatment for this condition may depend on what caused the condition. Treatment options include:  Taking vitamin D supplements.  Taking a calcium supplement. Your health care provider will suggest what dose is best for you.  Follow these instructions at home:  Take medicines and supplements only as told by your health care provider.  Eat foods that contain vitamin D. Choices include: ? Fortified dairy products, cereals, or juices. Fortified means that vitamin D has been added to the food. Check the label on the package to be  sure. ? Fatty fish, such as salmon or trout. ? Eggs. ? Oysters.  Do not use a tanning bed.  Maintain a healthy weight. Lose weight, if needed.  Keep all follow-up visits as told by your health care provider. This is important. Contact a health care provider if:  Your symptoms do not go away.  You feel like throwing up (nausea) or you throw up (vomit).  You have fewer bowel movements than usual or it is difficult for you  to have a bowel movement (constipation). This information is not intended to replace advice given to you by your health care provider. Make sure you discuss any questions you have with your health care provider. Document Released: 08/09/2011 Document Revised: 10/29/2015 Document Reviewed: 10/02/2014 Elsevier Interactive Patient Education  2018 [employer]

## 2017-04-04 NOTE — Progress Notes (Signed)
Subjective:    Patient ID: Diana [name], female    DOB: [birth year], 61 y.o.   MRN: [id number] Chief Complaint  Patient presents with  . Follow-up    sinus issue is better than it was     HPI  Last saw pt 10 wks ago. Saw by my colleague 2 wks ago  Elevated ferritin (>2x ULN) despite low hgb and MCV - nml esr, crp, ck, ana, uric acid, RA, ASO  low vit D - started 6 mo course of high dose once wk. [facility] only gave her 4 tabs- has refills but pt adament that we have to call.   Crestor 10 was started 7/26. On asa 81. On metoprolol ER 25 qd and lisinopril 40  Doing PT for [facility] chronic back and neck pain. Hydrocodone caused nausea, lightheadedness so change to tramadol 2 wks prior and encouraged to retry baclofen if sxs cont. 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 Tramadol helping. Foot MUCH better but stillfeels like large to on left is pulling laterally and is numb/tingling on tip  She gets 14K-20K/d on steps.   Past Medical History:  Diagnosis Date  . Allergy   . Arthritis    feet  . Carpal tunnel syndrome    Past Surgical History:  Procedure Laterality Date  . TONSILLECTOMY    . TUBAL LIGATION    . WRIST SURGERY Right    Current Outpatient Medications on File Prior to Visit  Medication Sig Dispense Refill  . aspirin EC 81 MG EC tablet Take 1 tablet (81 mg total) by mouth daily. 30 tablet 11  . azelastine (ASTELIN) 0.1 % nasal spray Place 2 sprays into both nostrils 2 (two) times daily. Use in each nostril as directed 30 mL 0  . cetirizine (ZYRTEC) 10 MG tablet Take 1 tablet (10 mg total) by mouth at bedtime. 90 tablet 3  . fluticasone (FLONASE) 50 MCG/ACT nasal spray Place 2 sprays into both  nostrils daily. 16 g 2  . metoprolol succinate (TOPROL-XL) 25 MG 24 hr tablet Take 1 tablet (25 mg total) by mouth daily. 90 tablet 1  . montelukast (SINGULAIR) 10 MG tablet Take 1 tablet (10 mg total) by mouth at bedtime. 90 tablet 3  . rosuvastatin (CRESTOR) 10 MG tablet Take 1 tablet (10 mg total) by mouth daily. 90 tablet 1   No current facility-administered medications on file prior to visit.    Allergies  Allergen Reactions  . Penicillins Rash    Has patient had a PCN reaction causing immediate rash, facial/tongue/throat swelling, SOB or lightheadedness with hypotension: yes Has patient had a PCN reaction causing severe rash involving mucus membranes or skin necrosis:  no Has patient had a PCN reaction that required hospitalization:  no Has patient had a PCN reaction occurring within the last 10 years: no If all of the above answers are "NO", then may proceed with Cephalosporin use.   . Codeine Anxiety   Family History  Problem Relation Age of Onset  . Thyroid disease Mother   . Brain cancer Sister    Social History   Socioeconomic History  . Marital status: Divorced    Spouse name:  Erica  . Number of children: 2  . Years of education: 12th grade  . Highest education level: None  Social Needs  . Financial resource strain: None  . Food insecurity - worry: None  . Food insecurity - inability: None  . Transportation needs - medical: None  . Transportation needs - non-medical: None  Occupational History  . Occupation: Engineer, maintenance (IT)  Tobacco Use  . Smoking status: Never Smoker  . Smokeless tobacco: Never Used  Substance and Sexual Activity  . Alcohol use: No  . Drug use: No  . Sexual activity: No  Other Topics Concern  . None  Social History Narrative   Divorced from her husband. She has two adult sons from that union . One lives in [place], the other lives in [place] (as a Music therapist) following [employer].   Married Danae Chen 05/2014.   Depression screen [facility] 2/9 04/04/2017  03/18/2017 01/15/2017 10/29/2016 10/12/2016  Decreased Interest 0 0 0 0 0  Down, Depressed, Hopeless 0 0 0 0 0  PHQ - 2 Score 0 0 0 0 0    Review of Systems See hpi    Objective:   Physical Exam  Constitutional: She is oriented to person, place, and time. She appears well-developed and well-nourished. No distress.  HENT:  Head: Normocephalic and atraumatic.  Right Ear: External ear normal.  Left Ear: External ear normal.  Eyes: Conjunctivae are normal. No scleral icterus.  Neck: Normal range of motion. Neck supple. No thyromegaly present.  Cardiovascular: Normal rate, regular rhythm, normal heart sounds and intact distal pulses.  Pulmonary/Chest: Effort normal and breath sounds normal. No respiratory distress.  Musculoskeletal: She exhibits no edema.  Lymphadenopathy:    She has no cervical adenopathy.  Neurological: She is alert and oriented to person, place, and time.  Skin: Skin is warm and dry. She is not diaphoretic. No erythema.  Psychiatric: She has a normal mood and affect. Her behavior is normal.         BP 118/76   Pulse 100   Temp 98.3 F (36.8 C)   Resp 16   Ht 5' 1.75" (1.568 m)   Wt 133 lb 3.2 oz (60.4 kg)   SpO2 100%   BMI 24.56 kg/m    Dg Toe Great Left  Result Date: 04/04/2017 CLINICAL DATA:  Crush injury of the great toe 3 weeks ago with persistent numbness, decreased range of motion and decreased strength at the first MTP joint. EXAM: LEFT GREAT TOE COMPARISON:  Left foot of April 12, 2016 FINDINGS: The bones are subjectively adequately mineralized. Specific attention to the first ray reveals no acute or healing fracture. There is no lytic or blastic lesion. The joint spaces are well maintained. There is a bipartite medial sesamoid bone at the first MTP joint which is stable. No fracture or dislocation is observed within the foot. IMPRESSION: There is no acute or significant chronic bony abnormality of the left foot. Electronically Signed   By: David   Martinique M.D.   On: 04/04/2017 15:41     Assessment & Plan:   1. Elevated ferritin   2. Crushing injury of left foot, subsequent encounter   3. Vitamin D insufficiency   4. Neck pain   5. Left foot pain   6. Medication monitoring encounter      Orders Placed This Encounter  Procedures  . DG Toe Great Left    Standing Status:   Future    Number of Occurrences:   1  Standing Expiration Date:   04/04/2018    Order Specific Question:   Reason for Exam (SYMPTOM  OR DIAGNOSIS REQUIRED)    Answer:   crush injury 3 wks ago with continued numbness, decreased ROM, decreased strength at first MTP    Order Specific Question:   Is the patient pregnant?    Answer:   No    Order Specific Question:   Preferred imaging location?    Answer:   External  . Ferritin  . CBC with Differential/Platelet  . Comprehensive metabolic panel  . Ambulatory referral to Orthopedic Surgery    Referral Priority:   Routine    Referral Type:   Surgical    Referral Reason:   Specialty Services Required    Requested Specialty:   Orthopedic Surgery    Number of Visits Requested:   1    Meds ordered this encounter  Medications  . traMADol (ULTRAM) 50 MG tablet    Sig: Take 1 tablet (50 mg total) every 6 (six) hours as needed by mouth.    Dispense:  30 tablet    Refill:  0  . Vitamin D, Ergocalciferol, (DRISDOL) 50000 units CAPS capsule    Sig: Take 1 capsule (50,000 Units total) every 7 (seven) days by mouth.    Dispense:  12 capsule    Refill:  1  . nitroGLYCERIN (NITROSTAT) 0.4 MG SL tablet    Sig: Place 1 tablet (0.4 mg total) every 5 (five) minutes as needed under the tongue for chest pain.    Dispense:  30 tablet    Refill:  2  . esomeprazole (NEXIUM) 40 MG capsule    Sig: Take 1 capsule (40 mg total) daily by mouth. 30 minutes before a meal    Dispense:  90 capsule    Refill:  3  . lisinopril (PRINIVIL,ZESTRIL) 40 MG tablet    Sig: Take 1 tablet (40 mg total) daily by mouth.    Dispense:  90  tablet    Refill:  3    Delman Cheadle, M.D.  Primary Care at [facility] [address] [place], [place] [address] [phone] phone [phone] fax  04/07/17 1:40 PM

## 2017-04-05 LAB — CBC WITH DIFFERENTIAL/PLATELET
BASOS ABS: 0 10*3/uL (ref 0.0–0.2)
Basos: 0 %
EOS (ABSOLUTE): 0 10*3/uL (ref 0.0–0.4)
Eos: 2 %
HEMATOCRIT: 33.4 % — AB (ref 34.0–46.6)
HEMOGLOBIN: 10.5 g/dL — AB (ref 11.1–15.9)
IMMATURE GRANS (ABS): 0 10*3/uL (ref 0.0–0.1)
IMMATURE GRANULOCYTES: 0 %
LYMPHS: 41 %
Lymphocytes Absolute: 1.1 10*3/uL (ref 0.7–3.1)
MCH: 24.6 pg — ABNORMAL LOW (ref 26.6–33.0)
MCHC: 31.4 g/dL — AB (ref 31.5–35.7)
MCV: 78 fL — ABNORMAL LOW (ref 79–97)
MONOS ABS: 0.6 10*3/uL (ref 0.1–0.9)
Monocytes: 22 %
NEUTROS ABS: 1 10*3/uL — AB (ref 1.4–7.0)
NEUTROS PCT: 35 %
Platelets: 206 10*3/uL (ref 150–379)
RBC: 4.26 x10E6/uL (ref 3.77–5.28)
RDW: 16.2 % — ABNORMAL HIGH (ref 12.3–15.4)
WBC: 2.7 10*3/uL — ABNORMAL LOW (ref 3.4–10.8)

## 2017-04-05 LAB — FERRITIN: FERRITIN: 316 ng/mL — AB (ref 15–150)

## 2017-07-09 ENCOUNTER — Other Ambulatory Visit: Payer: Self-pay | Admitting: Physician Assistant

## 2017-07-09 DIAGNOSIS — R0989 Other specified symptoms and signs involving the circulatory and respiratory systems: Secondary | ICD-10-CM

## 2017-07-12 ENCOUNTER — Telehealth: Payer: Self-pay

## 2017-07-12 MED ORDER — ONDANSETRON 4 MG PO TBDP: 4.0000 mg | ORAL_TABLET | Freq: Three times a day (TID) | ORAL | 0 refills | Status: DC | PRN

## 2017-07-12 NOTE — Telephone Encounter (Signed)
lmvm to call us back and CRM entered

## 2017-07-12 NOTE — Telephone Encounter (Signed)
Does she want nausea med or sea sickness med? Sent in zofran for nausea but advise to stock up on ginger gum, ginger lozenges and pressure point wrist bands which work really great to prevent nausea from motion sickness.  I hope she has a WONDERFUL time and I'm proud of her for taking a vacation - she has earned it a billion times over!

## 2017-07-12 NOTE — Telephone Encounter (Signed)
Copied from CRM [id number]. Topic: General - Other >> Jul 12, 2017 10:51 AM Elliot GaultBell, Tiffany M wrote:  Relation to pt: self Call back [phone] [id number] [place] [address] [facility] [address] [phone] (Phone) [phone])    Reason for call:  Patient going on a cruise requesting nausea medication, requesting Rx, please advise >> Jul 12, 2017 10:54 AM Elliot GaultBell, Tiffany M wrote:  Relation to pt: self Call back number:[phone] [facility] [id number] - [place] - [address] AT [facility] [address] [phone] (Phone) [phone])    Reason for call:  Patient going on a cruise requesting nausea medication, requesting Rx, please advise

## 2017-07-25 ENCOUNTER — Encounter: Payer: Self-pay | Admitting: Family Medicine

## 2017-07-25 ENCOUNTER — Ambulatory Visit: Payer: BLUE CROSS/BLUE SHIELD

## 2017-07-25 ENCOUNTER — Other Ambulatory Visit: Payer: Self-pay

## 2017-07-25 ENCOUNTER — Ambulatory Visit: Payer: BLUE CROSS/BLUE SHIELD | Admitting: Family Medicine

## 2017-07-25 VITALS — BP 106/72 | HR 94 | Temp 97.6°F | Resp 16 | Ht 61.75 in | Wt 130.0 lb

## 2017-07-25 DIAGNOSIS — M5136 Other intervertebral disc degeneration, lumbar region: Secondary | ICD-10-CM | POA: Diagnosis not present

## 2017-07-25 DIAGNOSIS — R200 Anesthesia of skin: Secondary | ICD-10-CM

## 2017-07-25 DIAGNOSIS — M546 Pain in thoracic spine: Secondary | ICD-10-CM

## 2017-07-25 DIAGNOSIS — D72819 Decreased white blood cell count, unspecified: Secondary | ICD-10-CM

## 2017-07-25 DIAGNOSIS — R7303 Prediabetes: Secondary | ICD-10-CM | POA: Diagnosis not present

## 2017-07-25 DIAGNOSIS — R252 Cramp and spasm: Secondary | ICD-10-CM | POA: Diagnosis not present

## 2017-07-25 DIAGNOSIS — E559 Vitamin D deficiency, unspecified: Secondary | ICD-10-CM

## 2017-07-25 DIAGNOSIS — G8929 Other chronic pain: Secondary | ICD-10-CM

## 2017-07-25 DIAGNOSIS — R2689 Other abnormalities of gait and mobility: Secondary | ICD-10-CM | POA: Diagnosis not present

## 2017-07-25 DIAGNOSIS — D509 Iron deficiency anemia, unspecified: Secondary | ICD-10-CM | POA: Diagnosis not present

## 2017-07-25 DIAGNOSIS — M51369 Other intervertebral disc degeneration, lumbar region without mention of lumbar back pain or lower extremity pain: Secondary | ICD-10-CM

## 2017-07-25 DIAGNOSIS — R7989 Other specified abnormal findings of blood chemistry: Secondary | ICD-10-CM | POA: Diagnosis not present

## 2017-07-25 LAB — POCT GLYCOSYLATED HEMOGLOBIN (HGB A1C): HEMOGLOBIN A1C: 5.7

## 2017-07-25 LAB — POCT CBC
GRANULOCYTE PERCENT: 39.2 % (ref 37–80)
HEMATOCRIT: 36.3 % — AB (ref 37.7–47.9)
HEMOGLOBIN: 11.3 g/dL — AB (ref 12.2–16.2)
Lymph, poc: 1.3 (ref 0.6–3.4)
MCH, POC: 24.4 pg — AB (ref 27–31.2)
MCHC: 31 g/dL — AB (ref 31.8–35.4)
MCV: 78.4 fL — AB (ref 80–97)
MID (cbc): 0.5 (ref 0–0.9)
MPV: 7 fL (ref 0–99.8)
PLATELET COUNT, POC: 172 10*3/uL (ref 142–424)
POC GRANULOCYTE: 1.2 — AB (ref 2–6.9)
POC LYMPH PERCENT: 44.9 %L (ref 10–50)
POC MID %: 15.9 %M — AB (ref 0–12)
RBC: 4.62 M/uL (ref 4.04–5.48)
RDW, POC: 14.5 %
WBC: 3 10*3/uL — AB (ref 4.6–10.2)

## 2017-07-25 IMAGING — DX DG THORACIC SPINE 2V
3 series · 3 of 3 positions shown · non-contrast
Comparison: Chest x-ray dated [DATE] and [DATE]

CLINICAL DATA: Chronic midline thoracic spine pain. Left lower
extremity numbness and weakness.

EXAM:
THORACIC SPINE 2 VIEWS

[t-spine ap (1 of 2)]
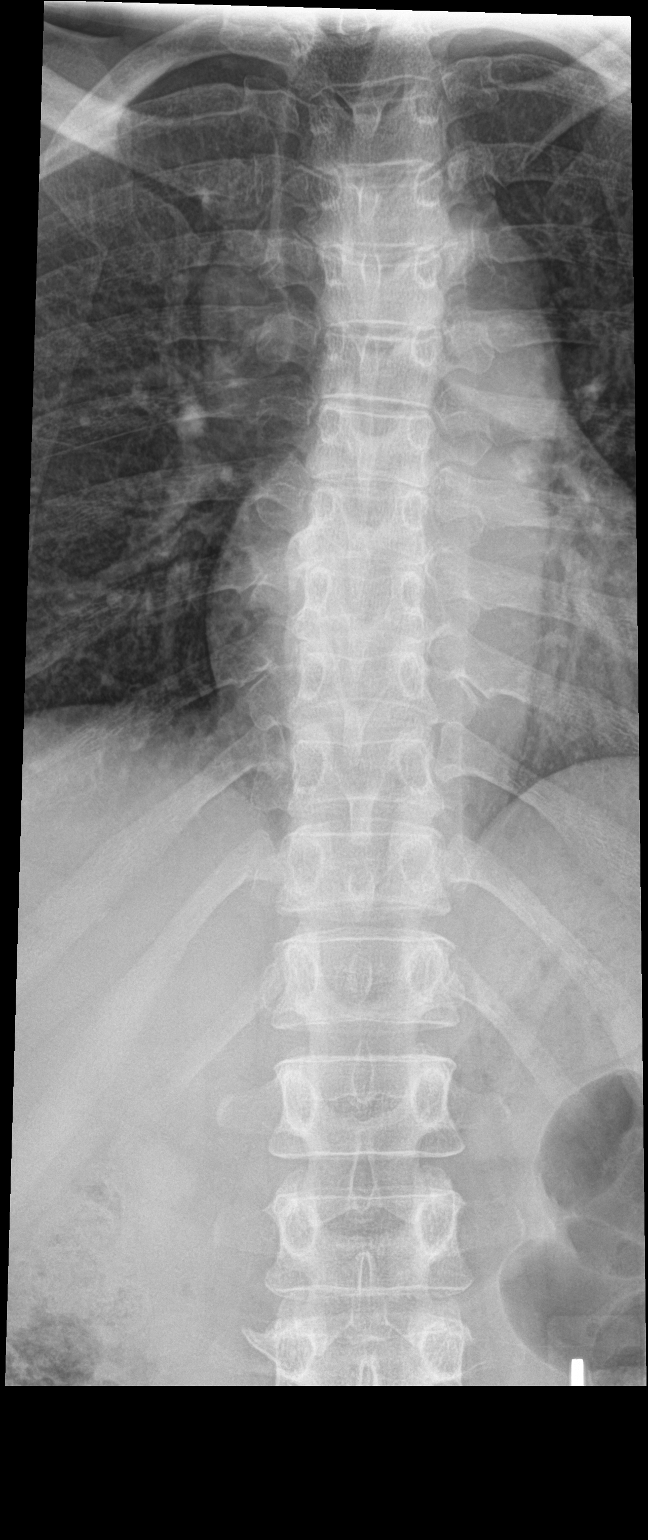

[t-spine lat]
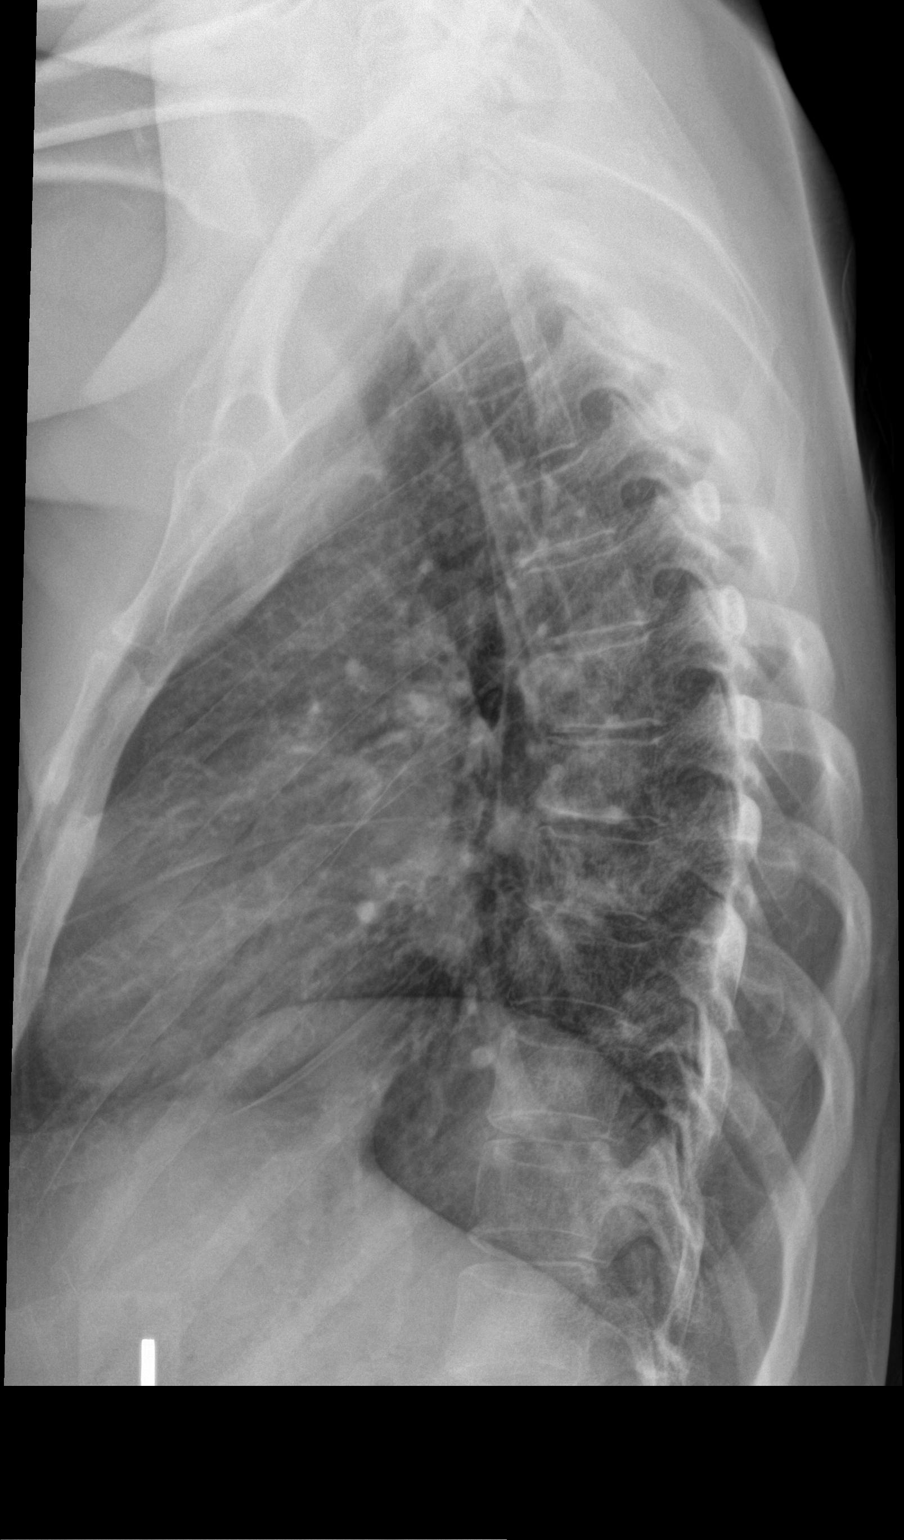

[t-spine ap (2 of 2)]
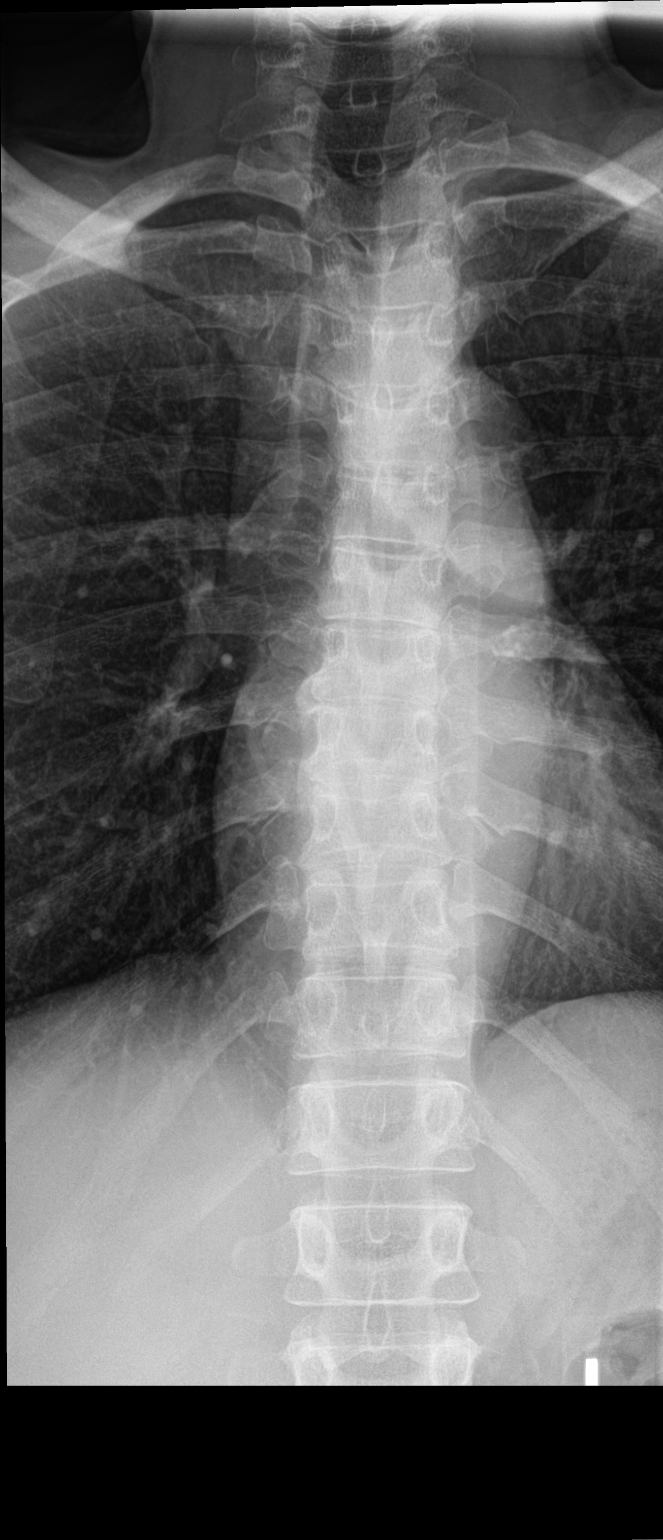

[3 of 3 positions shown; findings below may reference images not displayed]

FINDINGS: There is no evidence of thoracic spine fracture. No significant
alignment abnormality. No other significant bone abnormalities are
identified.
IMPRESSION: Negative.

## 2017-07-25 IMAGING — DX DG LUMBAR SPINE 2-3V
3 series · 3 of 3 positions shown · non-contrast
Comparison: None.

CLINICAL DATA: LEFT lower extremity numbness and weakness question
radiculopathy

EXAM:
LUMBAR SPINE - 2-3 VIEW

[l-spine ap]
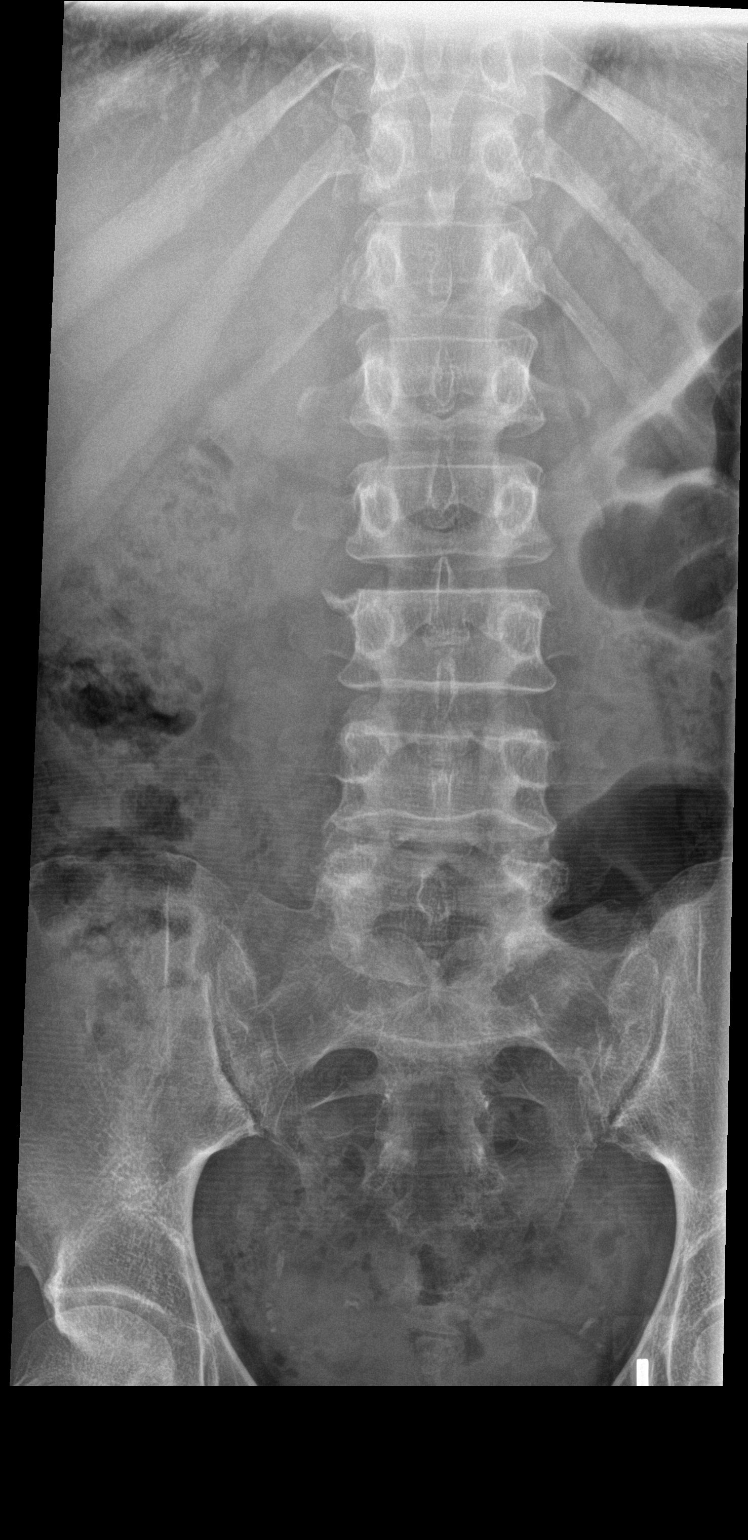

[l-spine lat]
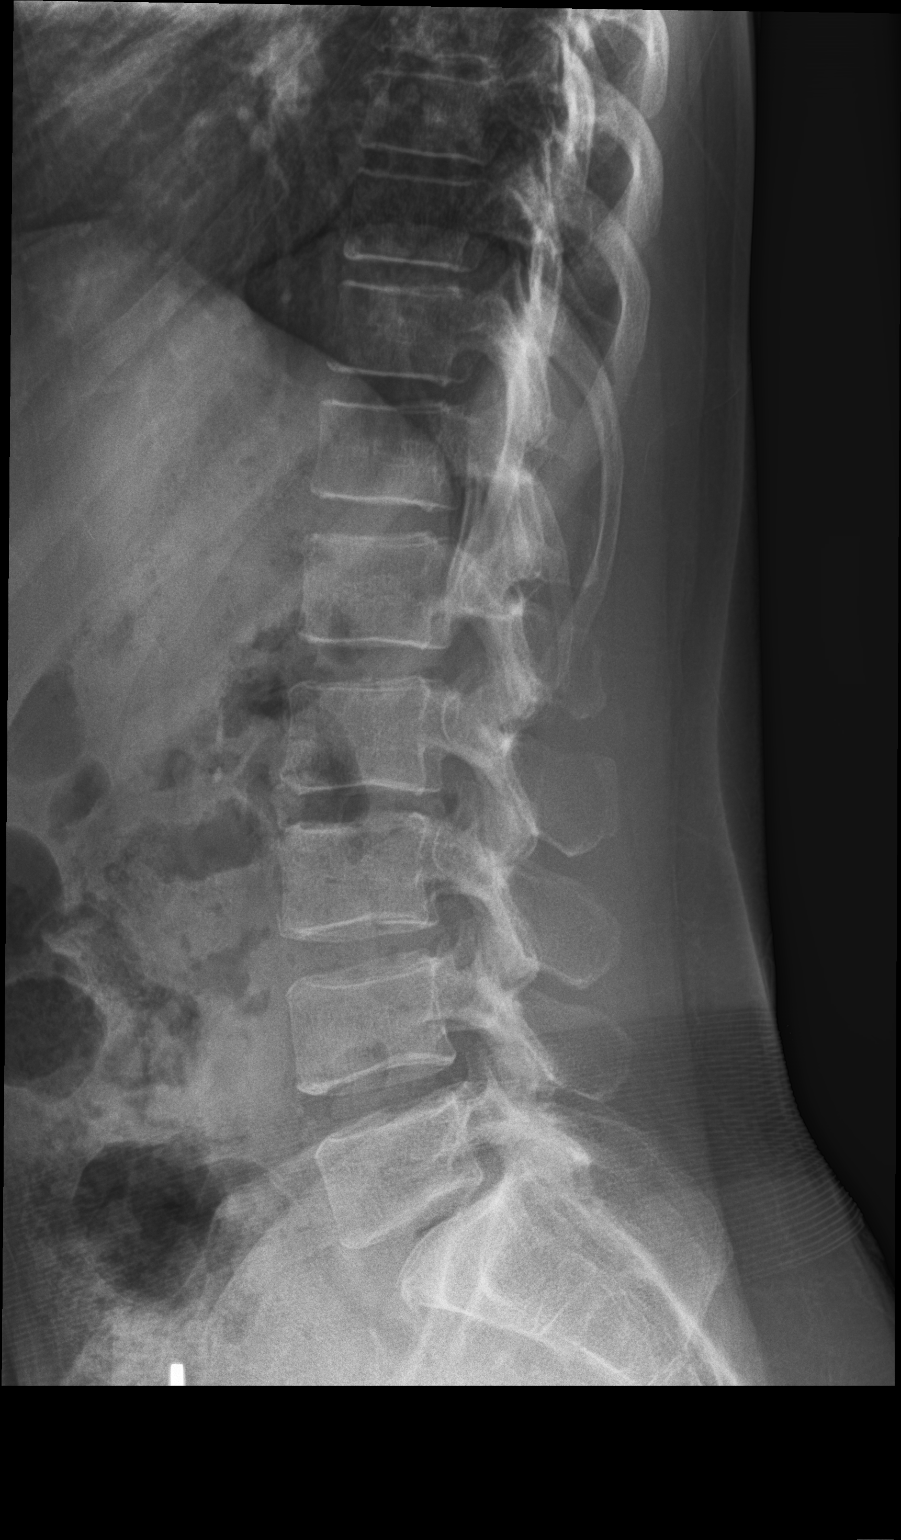

[l-spine l5-s1]
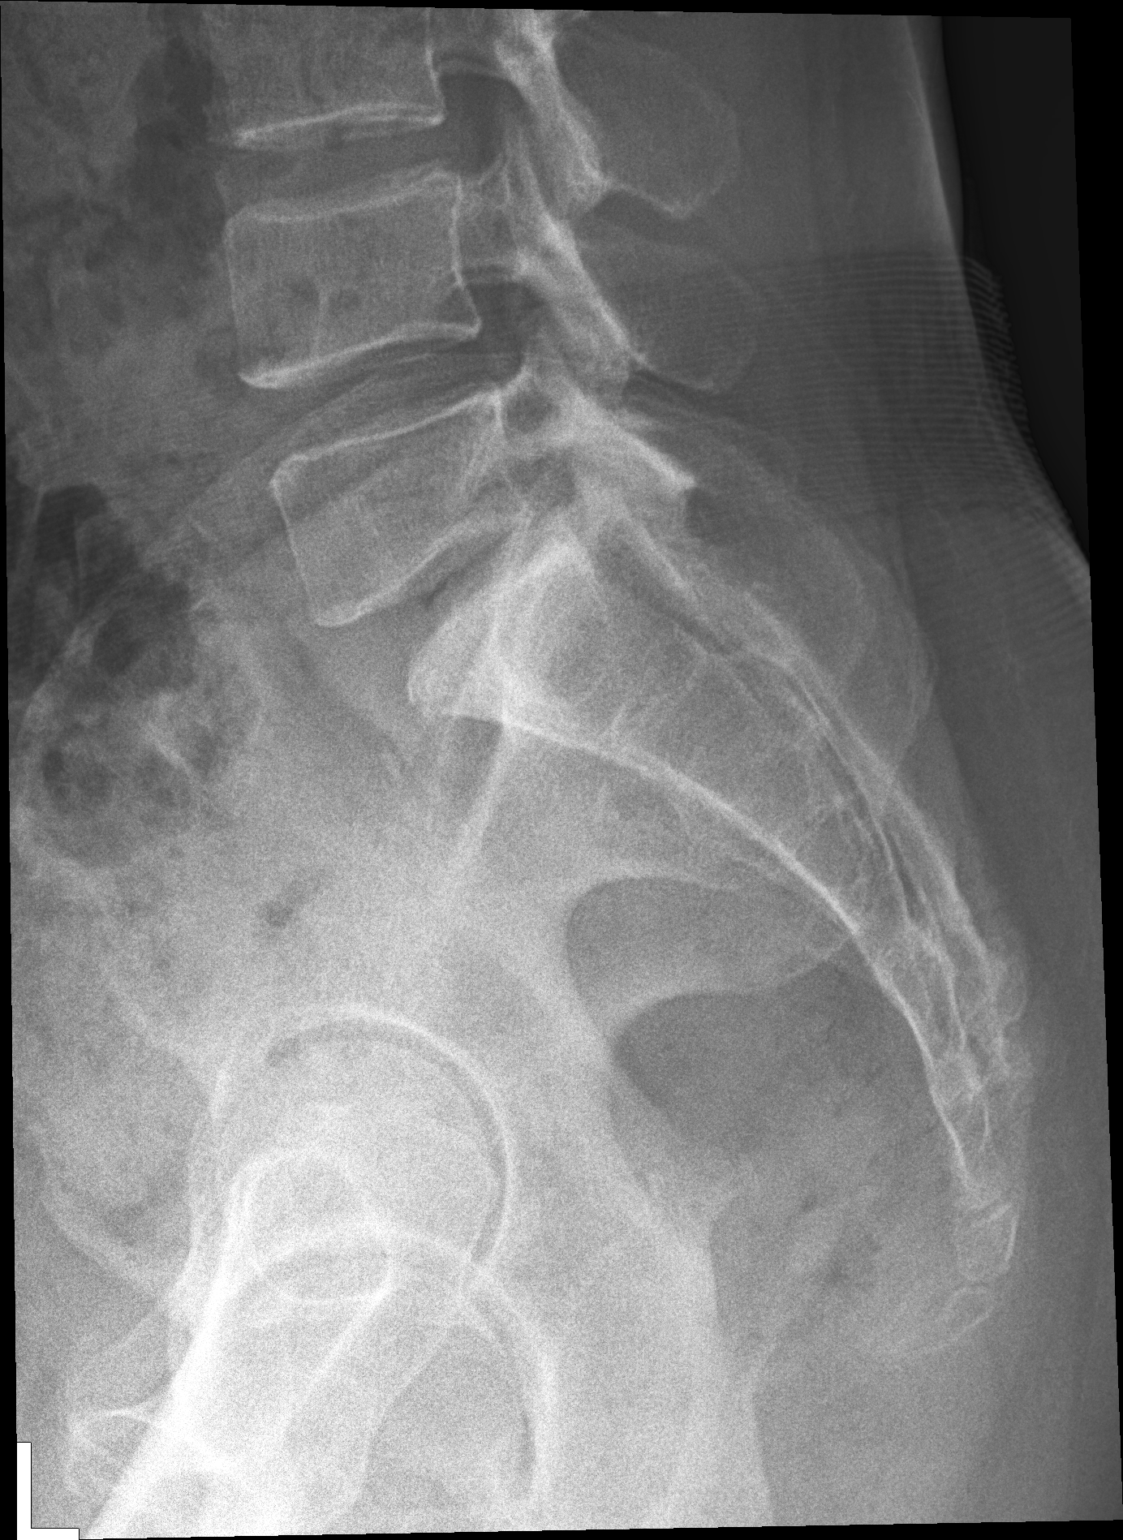

[3 of 3 positions shown; findings below may reference images not displayed]

FINDINGS: Five non rib bearing lumbar type vertebra.

Osseous demineralization.

Vertebral body and disk space heights maintained.

No acute fracture, subluxation or bone destruction.

Small superior endplate spur at L3 noted.

SI joints symmetric.

No gross evidence of spondylolysis.
IMPRESSION: No acute abnormalities.

## 2017-07-25 MED ORDER — CILOSTAZOL 100 MG PO TABS: 100.0000 mg | ORAL_TABLET | Freq: Two times a day (BID) | ORAL | 2 refills | Status: DC

## 2017-07-25 MED ORDER — ESOMEPRAZOLE MAGNESIUM 40 MG PO CPDR: 40.0000 mg | DELAYED_RELEASE_CAPSULE | Freq: Every day | ORAL | 3 refills | Status: DC

## 2017-07-25 NOTE — Patient Instructions (Signed)
Intermittent Claudication Intermittent claudication is pain in your leg that occurs when you walk or exercise and goes away when you rest. The pain can occur in one or both legs. What are the causes? Intermittent claudication is caused by the buildup of plaque within the major arteries in the body (atherosclerosis). The plaque, which makes arteries stiff and narrow, prevents enough blood from reaching your leg muscles. The pain occurs when you walk or exercise because your muscles need more blood when you are moving and exercising. What increases the risk? Risk factors include:  A family history of atherosclerosis.  A personal history of stroke or heart disease.  Older age.  Being inactive or overweight.  Smoking cigarettes.  Having another health condition such as: ? Diabetes. ? High blood pressure. ? High cholesterol.  What are the signs or symptoms? Your hip or leg may:  Ache.  Cramp.  Feel tight.  Feel weak.  Feel heavy.  Over time, you may feel pain in your calf, thigh, or hip. How is this diagnosed? Your health care provider may diagnose intermittent claudication based on your symptoms and medical history. Your health care provider may also do tests to learn more about your condition. These may include:  Blood tests.  An ultrasound.  Imaging tests such as angiography, magnetic resonance angiography (MRA), and computed tomography angiography (CTA).  How is this treated? You may be treated for problems such as:  High blood pressure.  High cholesterol.  Diabetes.  Other treatments may include:  Lifestyle changes such as: ? Starting an exercise program. ? Losing weight. ? Quitting smoking.  Medicines to help restore blood flow through your legs.  Blood vessel surgery (angioplasty) to restore blood flow if your intermittent claudication is caused by severe peripheral artery disease.  Follow these instructions at home:  Manage any other health  conditions you have.  Eat a diet low in saturated fats and calories to maintain a healthy weight.  Quit smoking, if you smoke.  Take medicines only as directed by your health care provider.  If your health care provider recommended an exercise program for you, follow it as directed. Your exercise program may involve: ? Walking three or more times a week. ? Walking until you have certain symptoms of intermittent claudication. ? Resting until symptoms go away. ? Gradually increasing walking time to about 50 minutes a day. Contact a health care provider if: Your condition is not getting better or is getting worse. Get help right away if:  You have chest pain.  You have difficulty breathing.  You develop arm weakness.  You have trouble speaking.  Your face begins to droop. This information is not intended to replace advice given to you by your health care provider. Make sure you discuss any questions you have with your health care provider. Document Released: 03/19/2004 Document Revised: 10/23/2015 Document Reviewed: 08/23/2013 Elsevier Interactive Patient Education  2017 [employer] Ankle-Brachial Index Test The ankle-brachial index (ABI) test is used to find peripheral vascular disease (PVD). PVD is also known as peripheral arterial disease (PAD). PVD is the blocking or hardening of the arteries anywhere within the circulatory system beyond the heart. PVD is caused by cholesterol deposits in your blood vessels (atherosclerosis). These deposits cause arteries to narrow. The delivery of oxygen to your tissues is impaired as a result. This can cause muscle pain and fatigue. This is called claudication. PVD means there may also be buildup of cholesterol in your:  Heart. This increases the risk  of heart attacks.  Brain. This increases the risk of strokes.  The ankle-brachial index test measures the blood flow in your arms and legs. This test also determines if blood vessels in your  leg are narrowed by cholesterol deposits. There are additional causes of a reduced ankle-brachial index, such as inflammation of vessels or a clot in the vessels. However, these are much less common than narrowing due to cholesterol deposits. What is being tested? The test is done while you are lying down and resting. Measurements are taken of the systolic pressure:  In your arm (brachial).  In your ankle at several points along your leg.  Systolic pressure is the pressure inside your arteries when your heart pumps. The measurements are taken several times on both sides. Then, the highest systolic pressure of the ankle is divided by the highest brachial systolic pressure. The result is the ankle-brachial pressure ratio, or ABI. Sometimes this test is repeated after you have exercised on a treadmill for five minutes. You may have leg pain during the exercise portion of the test if you suffer from PAD. If the index number drops after exercise, this may show that PAD is present. A normal ABI ratio is between 0.9 and 1.4. A value below 0.9 is considered abnormal. This information is not intended to replace advice given to you by your health care provider. Make sure you discuss any questions you have with your health care provider. Document Released: 05/21/2004 Document Revised: 10/23/2015 Document Reviewed: 12/21/2013 Elsevier Interactive Patient Education  2018 [employer]  Peripheral Vascular Disease Peripheral vascular disease (PVD) is a disease of the blood vessels that are not part of your heart and brain. A simple term for PVD is poor circulation. In most cases, PVD narrows the blood vessels that carry blood from your heart to the rest of your body. This can result in a decreased supply of blood to your arms, legs, and internal organs, like your stomach or kidneys. However, it most often affects a person's lower legs and feet. There are two types of PVD.  Organic PVD. This is the more common  type. It is caused by damage to the structure of blood vessels.  Functional PVD. This is caused by conditions that make blood vessels contract and tighten (spasm).  Without treatment, PVD tends to get worse over time. PVD can also lead to acute ischemic limb. This is when an arm or limb suddenly has trouble getting enough blood. This is a medical emergency. What are the causes? Each type of PVD has many different causes. The most common cause of PVD is buildup of a fatty material (plaque) inside of your arteries (atherosclerosis). Small amounts of plaque can break off from the walls of the blood vessels and become lodged in a smaller artery. This blocks blood flow and can cause acute ischemic limb. Other common causes of PVD include:  Blood clots that form inside of blood vessels.  Injuries to blood vessels.  Diseases that cause inflammation of blood vessels or cause blood vessel spasms.  Health behaviors and health history that increase your risk of developing PVD.  What increases the risk? You may have a greater risk of PVD if you:  Have a family history of PVD.  Have certain medical conditions, including: ? High cholesterol. ? Diabetes. ? High blood pressure (hypertension). ? Coronary heart disease. ? Past problems with blood clots. ? Past injury, such as burns or a broken bone. These may have damaged blood vessels in  your limbs. ? Buerger disease. This is caused by inflamed blood vessels in your hands and feet. ? Some forms of arthritis. ? Rare birth defects that affect the arteries in your legs.  Use tobacco.  Do not get enough exercise.  Are obese.  Are age 36 or older.  What are the signs or symptoms? PVD may cause many different symptoms. Your symptoms depend on what part of your body is not getting enough blood. Some common signs and symptoms include:  Cramps in your lower legs. This may be a symptom of poor leg circulation (claudication).  Pain and weakness in  your legs while you are physically active that goes away when you rest (intermittent claudication).  Leg pain when at rest.  Leg numbness, tingling, or weakness.  Coldness in a leg or foot, especially when compared with the other leg.  Skin or hair changes. These can include: ? Hair loss. ? Shiny skin. ? Pale or bluish skin. ? Thick toenails.  Inability to get or maintain an erection (erectile dysfunction).  People with PVD are more prone to developing ulcers and sores on their toes, feet, or legs. These may take longer than normal to heal. How is this diagnosed? Your health care provider may diagnose PVD from your signs and symptoms. The health care provider will also do a physical exam. You may have tests to find out what is causing your PVD and determine its severity. Tests may include:  Blood pressure recordings from your arms and legs and measurements of the strength of your pulses (pulse volume recordings).  Imaging studies using sound waves to take pictures of the blood flow through your blood vessels (Doppler ultrasound).  Injecting a dye into your blood vessels before having imaging studies using: ? X-rays (angiogram or arteriogram). ? Computer-generated X-rays (CT angiogram). ? A powerful electromagnetic field and a computer (magnetic resonance angiogram or MRA).  How is this treated? Treatment for PVD depends on the cause of your condition and the severity of your symptoms. It also depends on your age. Underlying causes need to be treated and controlled. These include long-lasting (chronic) conditions, such as diabetes, high cholesterol, and high blood pressure. You may need to first try making lifestyle changes and taking medicines. Surgery may be needed if these do not work. Lifestyle changes may include:  Quitting smoking.  Exercising regularly.  Following a low-fat, low-cholesterol diet.  Medicines may include:  Blood thinners to prevent blood  clots.  Medicines to improve blood flow.  Medicines to improve your blood cholesterol levels.  Surgical procedures may include:  A procedure that uses an inflated balloon to open a blocked artery and improve blood flow (angioplasty).  A procedure to put in a tube (stent) to keep a blocked artery open (stent implant).  Surgery to reroute blood flow around a blocked artery (peripheral bypass surgery).  Surgery to remove dead tissue from an infected wound on the affected limb.  Amputation. This is surgical removal of the affected limb. This may be necessary in cases of acute ischemic limb that are not improved through medical or surgical treatments.  Follow these instructions at home:  Take medicines only as directed by your health care provider.  Do not use any tobacco products, including cigarettes, chewing tobacco, or electronic cigarettes. If you need help quitting, ask your health care provider.  Lose weight if you are overweight, and maintain a healthy weight as directed by your health care provider.  Eat a diet that is low  in fat and cholesterol. If you need help, ask your health care provider.  Exercise regularly. Ask your health care provider to suggest some good activities for you.  Use compression stockings or other mechanical devices as directed by your health care provider.  Take good care of your feet. ? Wear comfortable shoes that fit well. ? Check your feet often for any cuts or sores. Contact a health care provider if:  You have cramps in your legs while walking.  You have leg pain when you are at rest.  You have coldness in a leg or foot.  Your skin changes.  You have erectile dysfunction.  You have cuts or sores on your feet that are not healing. Get help right away if:  Your arm or leg turns cold and blue.  Your arms or legs become red, warm, swollen, painful, or numb.  You have chest pain or trouble breathing.  You suddenly have weakness in  your face, arm, or leg.  You become very confused or lose the ability to speak.  You suddenly have a very bad headache or lose your vision. This information is not intended to replace advice given to you by your health care provider. Make sure you discuss any questions you have with your health care provider. Document Released: 06/24/2004 Document Revised: 10/23/2015 Document Reviewed: 10/25/2013 Elsevier Interactive Patient Education  2017 [employer].

## 2017-07-25 NOTE — Progress Notes (Signed)
Subjective:    Patient ID: Diana [name], female    DOB: 1956-04-22, 62 y.o.   MRN: [id number] Chief Complaint  Patient presents with  . Follow-up    HPI   Spasms in left foot up calf still occurring - happened twice last week - once woke from sleep and once during day when she was walking around.   Will last for about 15 min.  She is no longer having spasms in her Rt glut She feels like her first and second toe overlap. Left feels much colder.  Middle of neck hurts when she has to hold her head up straight. So she frequently sits/walks with her head hanging forward  She has been stumbling over her own feet, made worse as she wears slip resistance shoes.  She hasn't been on her BP or chol meds as she was saving $$ for her vacation.  Her BP has been at 136-140 at its highest even off of medication. Sometimes her HR will be up - 90s - near 100.   Has been taking sinus, asa, vit D  Past Medical History:  Diagnosis Date  . Allergy   . Arthritis    feet  . Carpal tunnel syndrome    Past Surgical History:  Procedure Laterality Date  . CARPAL TUNNEL RELEASE Left 02/12/2016   Procedure: LEFT CARPAL TUNNEL RELEASE;  Surgeon: Daryll Brod, MD;  Location: [facility];  Service: Orthopedics;  Laterality: Left;  FAB  . TONSILLECTOMY    . TUBAL LIGATION    . WRIST SURGERY Right    Current Outpatient Medications on File Prior to Visit  Medication Sig Dispense Refill  . aspirin EC 81 MG EC tablet Take 1 tablet (81 mg total) by mouth daily. 30 tablet 11  . cetirizine (ZYRTEC) 10 MG tablet Take 1 tablet (10 mg total) by mouth at bedtime. 90 tablet 3  . montelukast (SINGULAIR) 10 MG tablet Take 1 tablet (10 mg total) by mouth at bedtime. 90 tablet 3  . nitroGLYCERIN (NITROSTAT) 0.4 MG SL tablet Place 1 tablet (0.4 mg total) every 5 (five) minutes as needed under the tongue for chest pain. 30 tablet 2  . Vitamin D, Ergocalciferol, (DRISDOL) 50000 units CAPS capsule Take 1 capsule  (50,000 Units total) every 7 (seven) days by mouth. 12 capsule 1  . azelastine (ASTELIN) 0.1 % nasal spray USE 2 SPRAYS IN EACH NOSTRIL TWICE DAILY AS DIRECTED (Patient not taking: Reported on 07/25/2017) 30 mL 0  . esomeprazole (NEXIUM) 40 MG capsule Take 1 capsule (40 mg total) daily by mouth. 30 minutes before a meal (Patient not taking: Reported on 07/25/2017) 90 capsule 3  . fluticasone (FLONASE) 50 MCG/ACT nasal spray Place 2 sprays into both nostrils daily. (Patient not taking: Reported on 07/25/2017) 16 g 2  . lisinopril (PRINIVIL,ZESTRIL) 40 MG tablet Take 1 tablet (40 mg total) daily by mouth. (Patient not taking: Reported on 07/25/2017) 90 tablet 3  . metoprolol succinate (TOPROL-XL) 25 MG 24 hr tablet Take 1 tablet (25 mg total) by mouth daily. (Patient not taking: Reported on 07/25/2017) 90 tablet 1  . ondansetron (ZOFRAN ODT) 4 MG disintegrating tablet Take 1 tablet (4 mg total) by mouth every 8 (eight) hours as needed for nausea or vomiting. (Patient not taking: Reported on 07/25/2017) 20 tablet 0  . rosuvastatin (CRESTOR) 10 MG tablet Take 1 tablet (10 mg total) by mouth daily. (Patient not taking: Reported on 07/25/2017) 90 tablet 1  . traMADol (ULTRAM)  50 MG tablet Take 1 tablet (50 mg total) every 6 (six) hours as needed by mouth. (Patient not taking: Reported on 07/25/2017) 30 tablet 0   No current facility-administered medications on file prior to visit.    Allergies  Allergen Reactions  . Penicillins Rash    Has patient had a PCN reaction causing immediate rash, facial/tongue/throat swelling, SOB or lightheadedness with hypotension: yes Has patient had a PCN reaction causing severe rash involving mucus membranes or skin necrosis:  no Has patient had a PCN reaction that required hospitalization:  no Has patient had a PCN reaction occurring within the last 10 years: no If all of the above answers are "NO", then may proceed with Cephalosporin use.   . Codeine Anxiety   Family  History  Problem Relation Age of Onset  . Thyroid disease Mother   . Brain cancer Sister    Social History   Socioeconomic History  . Marital status: Divorced    Spouse name: Danae Chen  . Number of children: 2  . Years of education: 12th grade  . Highest education level: None  Social Needs  . Financial resource strain: None  . Food insecurity - worry: None  . Food insecurity - inability: None  . Transportation needs - medical: None  . Transportation needs - non-medical: None  Occupational History  . Occupation: Engineer, maintenance (IT)  Tobacco Use  . Smoking status: Never Smoker  . Smokeless tobacco: Never Used  Substance and Sexual Activity  . Alcohol use: No  . Drug use: No  . Sexual activity: No  Other Topics Concern  . None  Social History Narrative   Divorced from her husband. She has two adult sons from that union . One lives in [place], the other lives in [place] (as a Music therapist) following [employer].   Married Danae Chen 05/2014.   Depression screen [facility] 2/9 04/04/2017 03/18/2017 01/15/2017 10/29/2016 10/12/2016  Decreased Interest 0 0 0 0 0  Down, Depressed, Hopeless 0 0 0 0 0  PHQ - 2 Score 0 0 0 0 0     Review of Systems  Constitutional: Negative for activity change, appetite change, chills, fatigue and fever.  HENT: Positive for congestion and sinus pressure.   Respiratory: Negative for chest tightness and shortness of breath.   Cardiovascular: Negative for chest pain, palpitations and leg swelling.  Musculoskeletal: Positive for arthralgias, back pain, gait problem, myalgias, neck pain and neck stiffness. Negative for joint swelling.  Skin: Negative for color change and rash.  Allergic/Immunologic: Positive for environmental allergies. Negative for immunocompromised state.  Neurological: Positive for numbness. Negative for weakness.  Psychiatric/Behavioral: Negative for dysphoric mood. The patient is not nervous/anxious.        Objective:   Physical Exam  Constitutional: She is  oriented to person, place, and time. She appears well-developed and well-nourished. No distress.  HENT:  Head: Normocephalic and atraumatic.  Right Ear: External ear normal.  Left Ear: External ear normal.  Eyes: Conjunctivae are normal. No scleral icterus.  Neck: Normal range of motion. Neck supple. No thyromegaly present.  Cardiovascular: Normal rate, regular rhythm, normal heart sounds and intact distal pulses.  Pulses:      Dorsalis pedis pulses are 2+ on the right side, and 0 on the left side.       Posterior tibial pulses are 2+ on the right side, and 1+ on the left side.  Pulmonary/Chest: Effort normal and breath sounds normal. No respiratory distress.  Musculoskeletal: She exhibits no edema.  Right foot: There is normal range of motion, no tenderness and normal capillary refill.       Left foot: There is decreased capillary refill. There is normal range of motion, no tenderness and no swelling.  Lymphadenopathy:    She has no cervical adenopathy.  Neurological: She is alert and oriented to person, place, and time. She has normal strength. She displays no atrophy. A sensory deficit is present. She exhibits normal muscle tone. Gait normal.  Skin: Skin is warm and dry. She is not diaphoretic. No erythema.  Psychiatric: She has a normal mood and affect. Her behavior is normal.  Strength 5/5 throughout B lower ext, slight decreased sensation to light touch but not sharp in left lower leg - left anterior shin Left distal foot sig colder to touch than right.  BP 106/72   Pulse 94   Temp 97.6 F (36.4 C)   Resp 16   Ht 5' 1.75" (1.568 m)   Wt 130 lb (59 kg)   SpO2 99%   BMI 23.97 kg/m      Results for orders placed or performed in visit on 07/25/17  POCT CBC  Result Value Ref Range   WBC 3.0 (A) 4.6 - 10.2 K/uL   Lymph, poc 1.3 0.6 - 3.4   POC LYMPH PERCENT 44.9 10 - 50 %L   MID (cbc) 0.5 0 - 0.9   POC MID % 15.9 (A) 0 - 12 %M   POC Granulocyte 1.2 (A) 2 - 6.9    Granulocyte percent 39.2 37 - 80 %G   RBC 4.62 4.04 - 5.48 M/uL   Hemoglobin 11.3 (A) 12.2 - 16.2 g/dL   HCT, POC 36.3 (A) 37.7 - 47.9 %   MCV 78.4 (A) 80 - 97 fL   MCH, POC 24.4 (A) 27 - 31.2 pg   MCHC 31.0 (A) 31.8 - 35.4 g/dL   RDW, POC 14.5 %   Platelet Count, POC 172 142 - 424 K/uL   MPV 7.0 0 - 99.8 fL   Dg Thoracic Spine 2 View  Result Date: 07/25/2017 CLINICAL DATA:  Chronic midline thoracic spine pain. Left lower extremity numbness and weakness. EXAM: THORACIC SPINE 2 VIEWS COMPARISON:  Chest x-ray dated 10/29/2016 and 06/14/2007 FINDINGS: There is no evidence of thoracic spine fracture. No significant alignment abnormality. No other significant bone abnormalities are identified. IMPRESSION: Negative. Electronically Signed   By: Lorriane Shire M.D.   On: 07/25/2017 16:15   Dg Lumbar Spine 2-3 Views  Result Date: 07/25/2017 CLINICAL DATA:  LEFT lower extremity numbness and weakness question radiculopathy EXAM: LUMBAR SPINE - 2-3 VIEW COMPARISON:  None. FINDINGS: Five non rib bearing lumbar type vertebra. Osseous demineralization. Vertebral body and disk space heights maintained. No acute fracture, subluxation or bone destruction. Small superior endplate spur at L3 noted. SI joints symmetric. No gross evidence of spondylolysis. IMPRESSION: No acute abnormalities. Electronically Signed   By: Lavonia Dana M.D.   On: 07/25/2017 16:25    Assessment & Plan:   1. Microcytic anemia - chronic low RBC and WBCs, nml platelets, have been stable for 10 yrs - pt never knew her baseline blood counts were abnormal until I asked her about last yr so she has no idea who chronic is this, denies potential etiology or h/o work-up.  In eval, I checked a ferritin which came back very high and normal iron levels - the ferritin has come down in the months since but never back to normal - unknown etiology,  liver normal. I wonder if she could have a type of hemolytic anemia? Will refer to hematology for further  evaluation to w/u anemia so we can see if ferritin is tied in to that  2. Leukopenia, unspecified type   3. Elevated ferritin - not taking any iron supps. Nml iron level - actually towards the lower 25% of the normal range - TIBC and iron sat nml. Nml CK, nml ESR, CRP, neg ANA. Nml LFTs but no prior liver imaging. Neg Hep C and HIV 05/2015.  Will see if hematology thinks ferritin is related to anemia. If not, likely needs liver US. and consider GI eval? Lab Results  Component Value Date   FERRITIN 215 (H) 07/25/2017   FERRITIN 316 (H) 04/04/2017   FERRITIN 315 (H) 01/15/2017   FERRITIN 347 (H) 10/29/2016     4. Vitamin D deficiency - taking once weekly high dose replacement x 3 mos - still has an additional 3 mos to complete before changing to daily otc supp- recheck.  5. Imbalance   6. Leg cramp   7. Chronic midline thoracic back pain   8. Numbness of left lower extremity   9. Prediabetes - a1c 5.7 today  10. Degeneration of intervertebral disc of lumbar region    left lower left with claudication, severe muscle cramp w/ sig activity or in cold, decreased sensation over left shin, Lt DP pulse not palp, foot cold - I wonder if pt could have left lower ext PAD that is causing some slight weakness in her left leg and causing her to stumble over her left foot more. Also suspect sensation change is causing the odd toe discomfort/crossing sensation she is experiencing - check ABI and LLExt arteria doppler. Pt wants to do trial of med so will try pletal. Recheck in 1 mo after ABI and pletal trial - if these studies are negative, would next consider EMG/NCV to find out etiology - doubt L-spine radiculopathy/myelopathy as pain not consistent and xray looks pretty good.  Orders Placed This Encounter  Procedures  . DG Lumbar Spine 2-3 Views    Standing Status:   Future    Number of Occurrences:   1    Standing Expiration Date:   07/25/2018    Order Specific Question:   Reason for Exam (SYMPTOM  OR  DIAGNOSIS REQUIRED)    Answer:   left lower extremity numbness and weakness, eval for poss radiculopathy, chronic midline upper back pain    Order Specific Question:   Is the patient pregnant?    Answer:   No    Order Specific Question:   Preferred imaging location?    Answer:   External  . DG Thoracic Spine 2 View    Standing Status:   Future    Number of Occurrences:   1    Standing Expiration Date:   07/25/2018    Order Specific Question:   Reason for Exam (SYMPTOM  OR DIAGNOSIS REQUIRED)    Answer:   left lower extremity numbness and weakness, eval for poss radiculopathy, chronic midline upper back pain    Order Specific Question:   Is the patient pregnant?    Answer:   No    Order Specific Question:   Preferred imaging location?    Answer:   External  . Ferritin  . Iron and TIBC([facility])  . Comprehensive metabolic panel  . VITAMIN D 25 Hydroxy (Vit-D Deficiency, Fractures)  . CK  . Pathologist smear review  . VITAMIN D  25 Hydroxy (Vit-D Deficiency, Fractures)  . CK  . Ambulatory referral to Hematology    Referral Priority:   Routine    Referral Type:   Consultation    Referral Reason:   Specialty Services Required    Requested Specialty:   Oncology    Number of Visits Requested:   1  . POCT CBC  . POCT urinalysis dipstick  . POCT Microscopic Urinalysis ([facility])  . POCT glycosylated hemoglobin (Hb A1C)    Meds ordered this encounter  Medications  . cilostazol (PLETAL) 100 MG tablet    Sig: Take 1 tablet (100 mg total) by mouth 2 (two) times daily. On an empty stomach 30 minutes before a meal or 2 hrs after    Dispense:  60 tablet    Refill:  2  . esomeprazole (NEXIUM) 40 MG capsule    Sig: Take 1 capsule (40 mg total) by mouth daily. 30 minutes before a meal    Dispense:  90 capsule    Refill:  3    Delman Cheadle, M.D.  Primary Care at [facility] [address] [place], [place] [address] [phone] phone [phone] fax  07/25/17 3:05 PM

## 2017-07-26 LAB — PATHOLOGIST SMEAR REVIEW
BASOS ABS: 0 10*3/uL (ref 0.0–0.2)
Basos: 0 %
EOS (ABSOLUTE): 0 10*3/uL (ref 0.0–0.4)
Eos: 1 %
HEMATOCRIT: 35.1 % (ref 34.0–46.6)
Hemoglobin: 11.1 g/dL (ref 11.1–15.9)
IMMATURE GRANS (ABS): 0 10*3/uL (ref 0.0–0.1)
IMMATURE GRANULOCYTES: 0 %
LYMPHS ABS: 1.4 10*3/uL (ref 0.7–3.1)
LYMPHS: 45 %
MCH: 24.7 pg — AB (ref 26.6–33.0)
MCHC: 31.6 g/dL (ref 31.5–35.7)
MCV: 78 fL — AB (ref 79–97)
MONOS ABS: 0.6 10*3/uL (ref 0.1–0.9)
Monocytes: 20 %
NEUTROS PCT: 34 %
Neutrophils Absolute: 1 10*3/uL — ABNORMAL LOW (ref 1.4–7.0)
PLATELETS: 183 10*3/uL (ref 150–379)
Path Rev PLTs: NORMAL
Path Rev WBC: NORMAL
RBC: 4.49 x10E6/uL (ref 3.77–5.28)
RDW: 15.8 % — AB (ref 12.3–15.4)
WBC: 3 10*3/uL — ABNORMAL LOW (ref 3.4–10.8)

## 2017-07-26 LAB — COMPREHENSIVE METABOLIC PANEL
A/G RATIO: 1.5 (ref 1.2–2.2)
ALT: 11 IU/L (ref 0–32)
AST: 20 IU/L (ref 0–40)
Albumin: 4.4 g/dL (ref 3.5–5.5)
Alkaline Phosphatase: 77 IU/L (ref 39–117)
BILIRUBIN TOTAL: 0.3 mg/dL (ref 0.0–1.2)
BUN/Creatinine Ratio: 38 — ABNORMAL HIGH (ref 9–23)
BUN: 20 mg/dL (ref 6–24)
CHLORIDE: 101 mmol/L (ref 96–106)
CO2: 25 mmol/L (ref 20–29)
Calcium: 9.4 mg/dL (ref 8.7–10.2)
Creatinine, Ser: 0.52 mg/dL — ABNORMAL LOW (ref 0.57–1.00)
GFR calc non Af Amer: 105 mL/min/{1.73_m2} (ref >59)
GFR, EST AFRICAN AMERICAN: 121 mL/min/{1.73_m2} (ref >59)
GLUCOSE: 94 mg/dL (ref 65–99)
Globulin, Total: 3 g/dL (ref 1.5–4.5)
POTASSIUM: 4.3 mmol/L (ref 3.5–5.2)
Sodium: 143 mmol/L (ref 134–144)
TOTAL PROTEIN: 7.4 g/dL (ref 6.0–8.5)

## 2017-07-26 LAB — CK: Total CK: 94 U/L (ref 24–173)

## 2017-07-26 LAB — IRON AND TIBC
IRON SATURATION: 15 % (ref 15–55)
IRON: 41 ug/dL (ref 27–159)
TIBC: 271 ug/dL (ref 250–450)
UIBC: 230 ug/dL (ref 131–425)

## 2017-07-26 LAB — VITAMIN D 25 HYDROXY (VIT D DEFICIENCY, FRACTURES): VIT D 25 HYDROXY: 33.8 ng/mL (ref 30.0–100.0)

## 2017-07-26 LAB — FERRITIN: FERRITIN: 215 ng/mL — AB (ref 15–150)

## 2017-07-27 ENCOUNTER — Encounter: Payer: Self-pay | Admitting: Hematology and Oncology

## 2017-07-27 ENCOUNTER — Telehealth: Payer: Self-pay | Admitting: Hematology and Oncology

## 2017-07-27 NOTE — Telephone Encounter (Signed)
Appt has been scheduled for the Diana [name] to see Dr. Pamelia HoitGudena on 3/28 at 345pm. I offered the Diana [name] an earlier appt date and time with a different provider but the Diana [name] declined. Address verified. Letter mailed.

## 2017-07-29 NOTE — Progress Notes (Signed)
Letter sent.

## 2017-08-03 ENCOUNTER — Encounter (HOSPITAL_COMMUNITY): Payer: Self-pay

## 2017-08-10 ENCOUNTER — Telehealth: Payer: Self-pay

## 2017-08-10 NOTE — Telephone Encounter (Signed)
[facility] [place] She stated, while on Pletal 100mg , B/P readings were 153/90, 148/88, 141/90. She stated she also had a flushed face and headaches while on the medication.  She stopped taking 5 days ago. Current B/P readings off Pletal, 130/80 and 134/83 and she feels much better since off medication. Pharmacy of choice is [facility] on [address].

## 2017-08-10 NOTE — Telephone Encounter (Signed)
Phone call to patient. Left detailed message per signed release for patient to return call to clinic regarding medication change, blood pressure, and pharmacy.  When patient calls back, please ask her how her blood pressure has changed. Can she give examples? What has her blood pressure been like on and off medication? Is her preferred pharmacy [facility] on [address]? How long did she take medication and when did she discontinue?

## 2017-08-10 NOTE — Telephone Encounter (Signed)
Copied from CRM [id number]. Topic: Quick Communication - See Telephone Encounter >> Aug 09, 2017 12:17 PM Herby AbrahamJohnson, Diana [name] wrote: CRM for notification. See Telephone encounter for: pt called in to be advised. Pt was taking cilostazol (PLETAL) 100 MG tablet and said that it was causing nausea, dizziness and pt's BP was high. Pt says that she stopped taking medication and is feeling much better. Pt would like to know what other medication she could take that would help?   Please advise. CB: [phone]: [phone]  08/09/17.

## 2017-08-13 NOTE — Telephone Encounter (Signed)
Very glad she stopped taking that medication - that was the correct choice. Does not need any other med for replacement at this time.

## 2017-08-15 NOTE — Telephone Encounter (Signed)
Phone call to patient. Relayed message from provider regarding Pletal. Patient verbalizes understanding.   Patient has further questions regarding imaging- she wanted to know when her MRI was going to be. RN cannot see future order for MRI. Patient states Dr. Clelia CroftShaw wanted to get an MRI of her left foot. Can see future order for ABI. Patient states this is what her question was regarding. Confirmed appointment time of upcoming ABI with patient.   Patient has no further questions, appreciative of call.

## 2017-08-19 ENCOUNTER — Encounter: Payer: Self-pay | Admitting: Vascular Surgery

## 2017-08-19 ENCOUNTER — Ambulatory Visit (HOSPITAL_COMMUNITY)
Admission: RE | Admit: 2017-08-19 | Discharge: 2017-08-19 | Disposition: A | Discharge status: Home / Self Care | Payer: BLUE CROSS/BLUE SHIELD | Source: Ambulatory Visit | Attending: Family Medicine | Admitting: Family Medicine

## 2017-08-19 DIAGNOSIS — I10 Essential (primary) hypertension: Secondary | ICD-10-CM | POA: Insufficient documentation

## 2017-08-19 DIAGNOSIS — R252 Cramp and spasm: Secondary | ICD-10-CM | POA: Diagnosis not present

## 2017-08-19 DIAGNOSIS — R200 Anesthesia of skin: Secondary | ICD-10-CM | POA: Diagnosis not present

## 2017-08-19 DIAGNOSIS — I739 Peripheral vascular disease, unspecified: Secondary | ICD-10-CM | POA: Insufficient documentation

## 2017-08-25 ENCOUNTER — Inpatient Hospital Stay: Payer: BLUE CROSS/BLUE SHIELD | Attending: Hematology and Oncology | Admitting: Hematology and Oncology

## 2017-08-25 ENCOUNTER — Telehealth: Payer: Self-pay | Admitting: Hematology and Oncology

## 2017-08-25 ENCOUNTER — Other Ambulatory Visit: Payer: Self-pay | Admitting: Hematology and Oncology

## 2017-08-25 ENCOUNTER — Inpatient Hospital Stay: Payer: BLUE CROSS/BLUE SHIELD

## 2017-08-25 VITALS — BP 148/79 | HR 81 | Temp 97.8°F | Resp 18 | Ht 61.75 in | Wt 126.9 lb

## 2017-08-25 DIAGNOSIS — Z7982 Long term (current) use of aspirin: Secondary | ICD-10-CM

## 2017-08-25 DIAGNOSIS — D709 Neutropenia, unspecified: Secondary | ICD-10-CM | POA: Insufficient documentation

## 2017-08-25 DIAGNOSIS — D509 Iron deficiency anemia, unspecified: Secondary | ICD-10-CM

## 2017-08-25 DIAGNOSIS — Z809 Family history of malignant neoplasm, unspecified: Secondary | ICD-10-CM | POA: Diagnosis not present

## 2017-08-25 DIAGNOSIS — Z79899 Other long term (current) drug therapy: Secondary | ICD-10-CM | POA: Insufficient documentation

## 2017-08-25 DIAGNOSIS — D61818 Other pancytopenia: Secondary | ICD-10-CM

## 2017-08-25 DIAGNOSIS — M129 Arthropathy, unspecified: Secondary | ICD-10-CM | POA: Diagnosis not present

## 2017-08-25 LAB — CBC WITH DIFFERENTIAL (CANCER CENTER ONLY)
Basophils Absolute: 0 10*3/uL (ref 0.0–0.1)
Basophils Relative: 0 %
Eosinophils Absolute: 0.1 10*3/uL (ref 0.0–0.5)
Eosinophils Relative: 2 %
HCT: 33.8 % — ABNORMAL LOW (ref 34.8–46.6)
Hemoglobin: 10.5 g/dL — ABNORMAL LOW (ref 11.6–15.9)
Lymphocytes Relative: 41 %
Lymphs Abs: 1.3 10*3/uL (ref 0.9–3.3)
MCH: 24.3 pg — ABNORMAL LOW (ref 25.1–34.0)
MCHC: 31 g/dL — ABNORMAL LOW (ref 31.5–36.0)
MCV: 78.2 fL — ABNORMAL LOW (ref 79.5–101.0)
Monocytes Absolute: 0.5 10*3/uL (ref 0.1–0.9)
Monocytes Relative: 15 %
Neutro Abs: 1.3 10*3/uL — ABNORMAL LOW (ref 1.5–6.5)
Neutrophils Relative %: 42 %
Platelet Count: 168 10*3/uL (ref 145–400)
RBC: 4.32 MIL/uL (ref 3.70–5.45)
RDW: 14 % (ref 11.2–14.5)
WBC Count: 3.2 10*3/uL — ABNORMAL LOW (ref 3.9–10.3)

## 2017-08-25 LAB — FOLATE: Folate: 18 ng/mL

## 2017-08-25 LAB — RETICULOCYTES
RBC.: 4.31 MIL/uL (ref 3.87–5.11)
Retic Count, Absolute: 43.1 10*3/uL (ref 19.0–186.0)
Retic Ct Pct: 1 % (ref 0.4–3.1)

## 2017-08-25 LAB — VITAMIN B12: VITAMIN B 12: 371 pg/mL (ref 180–914)

## 2017-08-25 NOTE — Progress Notes (Signed)
[facility] NOTE  Patient Care Team: Shawnee Knapp, MD as PCP - General (Family Medicine) Daryll Brod, MD as Consulting Physician (Orthopedic Surgery)  CHIEF COMPLAINTS/PURPOSE OF CONSULTATION:  Follow-up of anemia and leukopenia  HISTORY OF PRESENTING ILLNESS:  Diana [name] 62 y.o. female is here because of history of microcytic anemia with normal iron studies.  She also has leukopenia with an absolute neutrophil count of 1000.  She has been asymptomatic without any major problems or concerns.  Denies any bleeding problems.  Previous work-up for iron studies was normal.  I reviewed her records extensively and collaborated the history with the patient.   MEDICAL HISTORY:  Past Medical History:  Diagnosis Date  . Allergy   . Arthritis    feet  . Carpal tunnel syndrome     SURGICAL HISTORY: Past Surgical History:  Procedure Laterality Date  . CARPAL TUNNEL RELEASE Left 02/12/2016   Procedure: LEFT CARPAL TUNNEL RELEASE;  Surgeon: Daryll Brod, MD;  Location: [facility];  Service: Orthopedics;  Laterality: Left;  FAB  . TONSILLECTOMY    . TUBAL LIGATION    . WRIST SURGERY Right     SOCIAL HISTORY: Social History   Socioeconomic History  . Marital status: Divorced    Spouse name: Danae Chen  . Number of children: 2  . Years of education: 12th grade  . Highest education level: Not on file  Occupational History  . Occupation: Engineer, maintenance (IT)  Social Needs  . Financial resource strain: Not on file  . Food insecurity:    Worry: Not on file    Inability: Not on file  . Transportation needs:    Medical: Not on file    Non-medical: Not on file  Tobacco Use  . Smoking status: Never Smoker  . Smokeless tobacco: Never Used  Substance and Sexual Activity  . Alcohol use: No  . Drug use: No  . Sexual activity: Never  Lifestyle  . Physical activity:    Days per week: Not on file    Minutes per session: Not on file  . Stress: Not on file   Relationships  . Social connections:    Talks on phone: Not on file    Gets together: Not on file    Attends religious service: Not on file    Active member of club or organization: Not on file    Attends meetings of clubs or organizations: Not on file    Relationship status: Not on file  . Intimate partner violence:    Fear of current or ex partner: Not on file    Emotionally abused: Not on file    Physically abused: Not on file    Forced sexual activity: Not on file  Other Topics Concern  . Not on file  Social History Narrative   Divorced from her husband. She has two adult sons from that union . 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   Married Danae Chen 05/2014.    FAMILY HISTORY: Family History  Problem Relation Age of Onset  . Thyroid disease Mother   . Brain cancer Sister     ALLERGIES:  is allergic to penicillins and codeine.  MEDICATIONS:  Current Outpatient Medications  Medication Sig Dispense Refill  . aspirin EC 81 MG EC tablet Take 1 tablet (81 mg total) by mouth daily. 30 tablet 11  . azelastine (ASTELIN) 0.1 % nasal spray USE 2 SPRAYS IN [facility]  NOSTRIL TWICE DAILY AS DIRECTED (Patient not taking: Reported on 07/25/2017) 30 mL 0  . cetirizine (ZYRTEC) 10 MG tablet Take 1 tablet (10 mg total) by mouth at bedtime. 90 tablet 3  . cilostazol (PLETAL) 100 MG tablet Take 1 tablet (100 mg total) by mouth 2 (two) times daily. On an empty stomach 30 minutes before a meal or 2 hrs after 60 tablet 2  . esomeprazole (NEXIUM) 40 MG capsule Take 1 capsule (40 mg total) by mouth daily. 30 minutes before a meal 90 capsule 3  . fluticasone (FLONASE) 50 MCG/ACT nasal spray Place 2 sprays into both nostrils daily. (Patient not taking: Reported on 07/25/2017) 16 g 2  . lisinopril (PRINIVIL,ZESTRIL) 40 MG tablet Take 1 tablet (40 mg total) daily by mouth. (Patient not taking: Reported on 07/25/2017) 90 tablet 3  . metoprolol succinate (TOPROL-XL)  25 MG 24 hr tablet Take 1 tablet (25 mg total) by mouth daily. (Patient not taking: Reported on 07/25/2017) 90 tablet 1  . montelukast (SINGULAIR) 10 MG tablet Take 1 tablet (10 mg total) by mouth at bedtime. 90 tablet 3  . nitroGLYCERIN (NITROSTAT) 0.4 MG SL tablet Place 1 tablet (0.4 mg total) every 5 (five) minutes as needed under the tongue for chest pain. 30 tablet 2  . rosuvastatin (CRESTOR) 10 MG tablet Take 1 tablet (10 mg total) by mouth daily. (Patient not taking: Reported on 07/25/2017) 90 tablet 1  . Vitamin D, Ergocalciferol, (DRISDOL) 50000 units CAPS capsule Take 1 capsule (50,000 Units total) every 7 (seven) days by mouth. 12 capsule 1   No current facility-administered medications for this visit.     REVIEW OF SYSTEMS:   Constitutional: Denies fevers, chills or abnormal night sweats Eyes: Denies blurriness of vision, double vision or watery eyes Ears, nose, mouth, throat, and face: Denies mucositis or sore throat Respiratory: Denies cough, dyspnea or wheezes Cardiovascular: Denies palpitation, chest discomfort or lower extremity swelling Gastrointestinal:  Denies nausea, heartburn or change in bowel habits Skin: Denies abnormal skin rashes Lymphatics: Denies new lymphadenopathy or easy bruising Neurological:Denies numbness, tingling or new weaknesses Behavioral/Psych: Mood is stable, no new changes   All other systems were reviewed with the patient and are negative.  PHYSICAL EXAMINATION: ECOG PERFORMANCE STATUS: 1 - Symptomatic but completely ambulatory  Vitals:   08/25/17 1548  BP: (!) 148/79  Pulse: 81  Resp: 18  Temp: 97.8 F (36.6 C)  SpO2: 100%   Filed Weights   08/25/17 1548  Weight: 126 lb 14.4 oz (57.6 kg)    GENERAL:alert, no distress and comfortable SKIN: skin color, texture, turgor are normal, no rashes or significant lesions EYES: normal, conjunctiva are pink and non-injected, sclera clear OROPHARYNX:no exudate, no erythema and lips, buccal  mucosa, and tongue normal  NECK: supple, thyroid normal size, non-tender, without nodularity LYMPH:  no palpable lymphadenopathy in the cervical, axillary or inguinal LUNGS: clear to auscultation and percussion with normal breathing effort HEART: regular rate & rhythm and no murmurs and no lower extremity edema ABDOMEN:abdomen soft, non-tender and normal bowel sounds Musculoskeletal:no cyanosis of digits and no clubbing  PSYCH: alert & oriented x 3 with fluent speech NEURO: no focal motor/sensory deficits   LABORATORY DATA:  I have reviewed the data as listed Lab Results  Component Value Date   WBC 3.2 (L) 08/25/2017   HGB 11.1 07/25/2017   HCT 33.8 (L) 08/25/2017   MCV 78.2 (L) 08/25/2017   PLT 168 08/25/2017   Lab Results  Component Value Date  NA 143 07/25/2017   K 4.3 07/25/2017   CL 101 07/25/2017   CO2 25 07/25/2017    RADIOGRAPHIC STUDIES: I have personally reviewed the radiological reports and agreed with the findings in the report.  ASSESSMENT AND PLAN:  Microcytic anemia Hemoglobin 11.1 with MCV of 78 and normal iron studies It suggest that the patient may have thalassemia. I will send for hemoglobin electrophoresis for further evaluation.  Neutropenia (HCC) Absolute neutrophil count 1000. Patient is completely asymptomatic.   Differential diagnosis 1.  Medication induced 2. autoimmune causes like lupus 3. B12 and folate deficiencies 4.  Bone marrow disorders 5. Ethnicity/cyclical  We will perform blood work today and return back to see [place] in 1 week to discuss the results and decide if she needs a bone marrow biopsy.  Patient is extremely paranoid about needles.    All questions were answered. The patient knows to call the clinic with any problems, questions or concerns.    Harriette Ohara, MD 08/25/17

## 2017-08-25 NOTE — Assessment & Plan Note (Signed)
Hemoglobin 11.1 with MCV of 78 and normal iron studies It suggest that the patient may have thalassemia. I will send for hemoglobin electrophoresis for further evaluation.

## 2017-08-25 NOTE — Telephone Encounter (Signed)
Gave patient AVS and calendar of upcoming April appointments.  °

## 2017-08-25 NOTE — Assessment & Plan Note (Signed)
Absolute neutrophil count 1000. Patient is completely asymptomatic.   Differential diagnosis 1.  Medication induced 2. autoimmune causes like lupus 3. B12 and folate deficiencies 4.  Bone marrow disorders 5. Ethnicity/cyclical  We will perform blood work today and return back to see [place] in 1 week to discuss the results and decide if she needs a bone marrow biopsy.  Patient is extremely paranoid about needles.

## 2017-08-27 ENCOUNTER — Ambulatory Visit: Payer: BLUE CROSS/BLUE SHIELD | Admitting: Family Medicine

## 2017-08-27 ENCOUNTER — Encounter: Payer: Self-pay | Admitting: Family Medicine

## 2017-08-27 ENCOUNTER — Other Ambulatory Visit: Payer: Self-pay

## 2017-08-27 VITALS — BP 128/72 | HR 87 | Temp 98.4°F | Resp 24 | Ht 61.75 in | Wt 127.0 lb

## 2017-08-27 DIAGNOSIS — M546 Pain in thoracic spine: Secondary | ICD-10-CM

## 2017-08-27 DIAGNOSIS — R200 Anesthesia of skin: Secondary | ICD-10-CM

## 2017-08-27 DIAGNOSIS — G8929 Other chronic pain: Secondary | ICD-10-CM

## 2017-08-27 DIAGNOSIS — R3121 Asymptomatic microscopic hematuria: Secondary | ICD-10-CM | POA: Diagnosis not present

## 2017-08-27 DIAGNOSIS — Z1211 Encounter for screening for malignant neoplasm of colon: Secondary | ICD-10-CM

## 2017-08-27 DIAGNOSIS — R208 Other disturbances of skin sensation: Secondary | ICD-10-CM

## 2017-08-27 DIAGNOSIS — Z1212 Encounter for screening for malignant neoplasm of rectum: Secondary | ICD-10-CM

## 2017-08-27 DIAGNOSIS — R8290 Unspecified abnormal findings in urine: Secondary | ICD-10-CM

## 2017-08-27 LAB — POCT URINALYSIS DIP (MANUAL ENTRY)
BILIRUBIN UA: NEGATIVE mg/dL
Bilirubin, UA: NEGATIVE
GLUCOSE UA: NEGATIVE mg/dL
Leukocytes, UA: NEGATIVE
Nitrite, UA: NEGATIVE
Protein Ur, POC: NEGATIVE mg/dL
SPEC GRAV UA: 1.02 (ref 1.010–1.025)
Urobilinogen, UA: 0.2 E.U./dL
pH, UA: 5.5 (ref 5.0–8.0)

## 2017-08-27 LAB — POC MICROSCOPIC URINALYSIS (UMFC): MUCUS RE: ABSENT

## 2017-08-27 MED ORDER — PREDNISONE 20 MG PO TABS
ORAL_TABLET | ORAL | 0 refills | Status: DC
Start: 2017-08-27 — End: 2017-11-04

## 2017-08-27 MED ORDER — CYCLOBENZAPRINE HCL 10 MG PO TABS: 10.0000 mg | ORAL_TABLET | Freq: Every day | ORAL | 1 refills | Status: DC

## 2017-08-27 MED ORDER — VITAMIN B-12 1000 MCG SL SUBL: 1000.0000 ug | SUBLINGUAL_TABLET | SUBLINGUAL | 0 refills | Status: DC

## 2017-08-27 MED ORDER — GABAPENTIN 300 MG PO CAPS: ORAL_CAPSULE | ORAL | 3 refills | Status: DC

## 2017-08-27 NOTE — Patient Instructions (Addendum)
Start a vitamin B12 supplement - a liquid or dissolvable tablet that you place underneath your tongue - 1000mcg a day.  Use the cyclobenzaprine while you are on the prednisone. After the prednisone is complete, start on the gabapentin. The gabapentin typically makes pts tired right at first so you might want to hold off the cyclobenzaprine if they are too sedating together.  I think you would benefit from chiropractor evaluation and treatment.  Consider: Damion Rodulfo and team at [facility] are great. Jacqualyn PoseyEugene Lewis at [facility] also have good results. Check out the [facility] as you could likely benefit from myofascial release [phone]  [email or web address]  Try the at night muscle relaxer cyclobenzaprine (will make you tired) followed by heat to your upper back.  If your foot is getting worse, I think we need to check a Nerve Conduction Velocity and Electromyography (EMG/NCV) and might need to get you to a neurologist.    Thoracic Strain A thoracic strain, which is sometimes called a mid-back strain, is an injury to the muscles or tendons that attach to the upper part of your back behind your chest. This type of injury occurs when a muscle is overstretched or overloaded. Thoracic strains can range from mild to severe. Mild strains may involve stretching a muscle or tendon without tearing it. These injuries may heal in 1-2 weeks. More severe strains involve tearing of muscle fibers or tendons. These will cause more pain and may take 6-8 weeks to heal. What are the causes? This condition may be caused by:  An injury in which a sudden force is placed on the muscle.  Exercising without properly warming up.  Overuse of the muscle.  Improper form during certain movements.  Other injuries that surround or cause stress on the mid-back,  causing a strain on the muscles.  In some cases, the cause may not be known. What increases the risk? This injury is more common in:  Athletes.  People with obesity.  What are the signs or symptoms? The main symptom of this condition is pain, especially with movement. Other symptoms include:  Bruising.  Swelling.  Spasm.  How is this diagnosed? This condition may be diagnosed with a physical exam. X-rays may be taken to check for a fracture. How is this treated? This condition may be treated with:  Resting and icing the injured area.  Physical therapy. This will involve doing stretching and strengthening exercises.  Medicines for pain and inflammation.  Follow these instructions at home:  Rest as needed. Follow instructions from your health care provider about any restrictions on activity.  If directed, apply ice to the injured area: ? Put ice in a plastic bag. ? Place a towel between your skin and the bag. ? Leave the ice on for 20 minutes, 2-3 times per day.  Take over-the-counter and prescription medicines only as told by your health care provider.  Begin doing exercises as told by your health care provider or physical therapist.  Always warm up properly before physical activity or sports.  Bend your knees before you lift heavy objects.  Keep all follow-up visits as told by your health care provider. This is important. Contact a health care provider if:  Your pain is not helped by medicine.  Your pain, bruising, or swelling is getting worse.  You have a fever. Get help right away if:  You have shortness of breath.  You have chest pain.  You develop numbness or weakness in your legs.  You have involuntary loss of urine (urinary incontinence). This information is not intended to replace advice given to you by your health care provider. Make sure you discuss any questions you have with your health care provider. Document Released: 08/07/2003 Document  Revised: 01/17/2016 Document Reviewed: 07/11/2014 Elsevier Interactive Patient Education  [employer].

## 2017-08-27 NOTE — Progress Notes (Addendum)
Subjective:  By signing my name below, I, [name], attest that this documentation has been prepared under the direction and in the presence of Norberto Sorenson, MD. Electronically Signed: [name], Scribe. 08/27/2017 , 1:22 PM .  Patient was seen in Room 1 .   Patient ID: Diana [name], female    DOB: 1955-08-05, 62 y.o.   MRN: [id number] Chief Complaint  Patient presents with  . Follow-up    Left leg U/S results & trial of blood thinner   HPI Diana [name] is a 62 y.o. female who presents to Primary Care at [facility] for follow up. Patient called in and notes while on Pletal, her BP was running in 140s-150s/90s with flushed face and headaches. So, stopped after 2 weeks and BP returned to 130s/80s.   Patient was seen 1 month prior with left lower extremity muscle cramps, worse with activity and in cold. She also had decrease sensation over left shin. DP pulse was not palpable and food was cold. She was noted to be stumbling and falling more, as well as an odd discomfort in her left great toe. Lumbar spine xray appeared relatively good, so she had a vascular arterial ultrasound and ABI's last week, which was normal. She saw Dr. Pamelia Hoit for chronic pancytopenia 2 days ago.   Patient states the cramping has improved some. She notes still keeping a bandage over her great toe to avoid the discomfort. She reports still having sensation changes over the top of her left foot. She's been wearing her socks all the time, so she hasn't noticed any skin color changes in her feet. She's been taking OTC magnesium for sciatic nerves and back pain, with some relief. She mentions still taking weekly vitamin D.   She also complains her neck pain is still bothering her. 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 She's been taking extra strength tylenol without relief. She noticed still stumbling a bit, although improved compared  to last visit. She isn't sure if her left foot is causing her to stumble, as she is usually walking fast.   Past Medical History:  Diagnosis Date  . Allergy   . Arthritis    feet  . Carpal tunnel syndrome    Past Surgical History:  Procedure Laterality Date  . CARPAL TUNNEL RELEASE Left 02/12/2016   Procedure: LEFT CARPAL TUNNEL RELEASE;  Surgeon: Cindee Salt, MD;  Location: [facility];  Service: Orthopedics;  Laterality: Left;  FAB  . TONSILLECTOMY    . TUBAL LIGATION    . WRIST SURGERY Right    Prior to Admission medications   Medication Sig Start Date End Date Taking? Authorizing Provider  cetirizine (ZYRTEC) 10 MG tablet Take 1 tablet (10 mg total) by mouth at bedtime. 01/15/17   Sherren Mocha, MD  fluticasone (FLONASE) 50 MCG/ACT nasal spray Place 2 sprays into both nostrils daily. Patient not taking: Reported on 07/25/2017 12/06/16   Sherren Mocha, MD  montelukast (SINGULAIR) 10 MG tablet Take 1 tablet (10 mg total) by mouth at bedtime. 01/15/17   Sherren Mocha, MD  nitroGLYCERIN (NITROSTAT) 0.4 MG SL tablet Place 1 tablet (0.4 mg total) every 5 (five) minutes as needed under the tongue for chest pain. 04/04/17   Sherren Mocha, MD  Vitamin D, Ergocalciferol, (DRISDOL) 50000 units CAPS capsule Take 1 capsule (50,000 Units total) every 7 (seven)  days by mouth. 04/04/17   Sherren Mocha, MD   Allergies  Allergen Reactions  . Penicillins Rash    Has patient had a PCN reaction causing immediate rash, facial/tongue/throat swelling, SOB or lightheadedness with hypotension: yes Has patient had a PCN reaction causing severe rash involving mucus membranes or skin necrosis:  no Has patient had a PCN reaction that required hospitalization:  no Has patient had a PCN reaction occurring within the last 10 years: no If all of the above answers are "NO", then may proceed with Cephalosporin use.   . Codeine Anxiety   Family History  Problem Relation Age of Onset  . Thyroid disease Mother   .  Brain cancer Sister    Social History   Socioeconomic History  . Marital status: Divorced    Spouse name: Alcario Drought  . Number of children: 2  . Years of education: 12th grade  . Highest education level: Not on file  Occupational History  . Occupation: Editor, commissioning  Social Needs  . Financial resource strain: Not on file  . Food insecurity:    Worry: Not on file    Inability: Not on file  . Transportation needs:    Medical: Not on file    Non-medical: Not on file  Tobacco Use  . Smoking status: Never Smoker  . Smokeless tobacco: Never Used  Substance and Sexual Activity  . Alcohol use: No  . Drug use: No  . Sexual activity: Never  Lifestyle  . Physical activity:    Days per week: Not on file    Minutes per session: Not on file  . Stress: Not on file  Relationships  . Social connections:    Talks on phone: Not on file    Gets together: Not on file    Attends religious service: Not on file    Active member of club or organization: Not on file    Attends meetings of clubs or organizations: Not on file    Relationship status: Not on file  Other Topics Concern  . Not on file  Social History Narrative   Divorced from her husband. She has two adult sons from that union . One lives in [place], the other lives in [place] (as a Solicitor) following Office manager.   Married Alcario Drought 05/2014.   Depression screen [facility] 2/9 04/04/2017 03/18/2017 01/15/2017 10/29/2016 10/12/2016  Decreased Interest 0 0 0 0 0  Down, Depressed, Hopeless 0 0 0 0 0  PHQ - 2 Score 0 0 0 0 0    Review of Systems  Constitutional: Negative for chills, fatigue, fever and unexpected weight change.  Respiratory: Negative for cough.   Gastrointestinal: Negative for constipation, diarrhea, nausea and vomiting.  Musculoskeletal: Positive for myalgias and neck pain.  Skin: Negative for rash and wound.  Neurological: Negative for dizziness, weakness, numbness and headaches.       Objective:   Physical Exam  Constitutional:  She is oriented to person, place, and time. She appears well-developed and well-nourished. No distress.  HENT:  Head: Normocephalic and atraumatic.  Eyes: Pupils are equal, round, and reactive to light. EOM are normal.  Neck: Neck supple.  Cardiovascular: Normal rate.  Pulses:      Dorsalis pedis pulses are 2+ on the right side, and 2+ on the left side.       Posterior tibial pulses are 2+ on the right side, and 2+ on the left side.  Pulmonary/Chest: Effort normal. No respiratory distress.  Musculoskeletal: Normal range  of motion.  Locates pain over her upper thoracic vertebrae around T3-4 through 6 area, tender to palpation Left ankle is colder than right, but legs with equal temperature, both distal feet are cold; strength 5/5 plantar and dorsal flexion  Neurological: She is alert and oriented to person, place, and time.  Reflex Scores:      Patellar reflexes are 2+ on the right side and 2+ on the left side.      Achilles reflexes are 2+ on the right side and 2+ on the left side. Skin: Skin is warm and dry.  Psychiatric: She has a normal mood and affect. Her behavior is normal.  Nursing note and vitals reviewed.   BP 128/72   Pulse 87   Temp 98.4 F (36.9 C) (Oral)   Resp (!) 24   Ht 5' 1.75" (1.568 m)   Wt 127 lb (57.6 kg)   SpO2 98%   BMI 23.42 kg/m   Results for orders placed or performed in visit on 08/27/17  POCT urinalysis dipstick  Result Value Ref Range   Color, UA yellow yellow   Clarity, UA clear clear   Glucose, UA negative negative mg/dL   Bilirubin, UA negative negative   Ketones, POC UA negative negative mg/dL   Spec Grav, UA 1.4781.020 2.9561.010 - 1.025   Blood, UA small (A) negative   pH, UA 5.5 5.0 - 8.0   Protein Ur, POC negative negative mg/dL   Urobilinogen, UA 0.2 0.2 or 1.0 E.U./dL   Nitrite, UA Negative Negative   Leukocytes, UA Negative Negative  POCT Microscopic Urinalysis ([facility])  Result Value Ref Range   WBC,UR,HPF,POC None None WBC/hpf    RBC,UR,HPF,POC Few (A) None RBC/hpf   Bacteria None None, Too numerous to count   Mucus Absent Absent   Epithelial Cells, UR Per Microscopy Few (A) None, Too numerous to count cells/hpf       Assessment & Plan:   1. Abnormal urine odor - ua nml other than small bld on dip and micro which doesn't explain odor - Pt LONG overdue for pelvic - no pap in chart - but she refuses today - though this is def next step if odor cont as well as most likely to be BV.  ALSO no prior mam or colonoscopy - discuss w/ pt at f/u - needs to sched CPE.    2. Chronic thoracic spine pain - pred taper qam w/ cyclobenzaprine qhs followed by heat, then start gabapentin AFTER pred course is complete - cautious about using gabapentin w/ cyclobenzaprine as both sedating Pt has been unable to afford PT but chronic pain is def from huge amount of overexerion/overtaking of back - would likely benefit from skilled chiropractor trx - recs given in AVS  3. Numbness of left foot - Reviewed results of NORMAL B ABI dont last wk w/ pt as sxs almost sounded like left claudication,  ? Reynaud's type sxs?, nml full lab w/u last mo - start sl b12 supp 1000mcg qd - level towards lower end of nml range - 371 (nml 180-914). If worsening at all, rec NCV/EMG and consider neurology referral  4.      Screening for colon cancer - none prior - discuss w/ pt at f/u - needs to sched CPE. 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 . . .  May have to settle for annual HOC. . . 5.      Asymptomatic microscopic hematuria - ua nml other than small bld on dip and micro - has had trace-lysed on prior dips and micro - encourage hydration and recheck at f/u - if persists will need clx, cytology, and urology - esp if worsening at all    Orders Placed This Encounter    Procedures  . POCT urinalysis dipstick  . POCT Microscopic Urinalysis ([facility])    Meds ordered this encounter  Medications  . Cyanocobalamin (VITAMIN B-12) 1000 MCG SUBL    Sig: Place 1 tablet (1,000 mcg total) under the tongue every morning.    Dispense:  360 tablet    Refill:  0  . predniSONE (DELTASONE) 20 MG tablet    Sig: Take 3 tabs po qam x 3d, then 2 tabs po qam x 3d, then 1 tab po qhs    Dispense:  18 tablet    Refill:  0  . cyclobenzaprine (FLEXERIL) 10 MG tablet    Sig: Take 1 tablet (10 mg total) by mouth at bedtime. For muscle spasms and may take 2 additional times as needed during the day for muscle spams    Dispense:  30 tablet    Refill:  1  . gabapentin (NEURONTIN) 300 MG capsule    Sig: Take 1 tab po qhs x 2 wk, then 1 tab po bid x 2 wk, then 1 tab po tid    Dispense:  90 capsule    Refill:  3    I personally performed the services described in this documentation, which was scribed in my presence. The recorded information has been reviewed and considered, and addended by me as needed.   Norberto Sorenson, M.D.  Primary Care at [facility] [address] [place], [place] [address] [phone] phone [phone] fax  11/14/17 12:54 AM

## 2017-08-28 LAB — ANTINUCLEAR ANTIBODIES, IFA: ANTINUCLEAR ANTIBODIES, IFA: NEGATIVE

## 2017-08-29 LAB — HEMOGLOBINOPATHY EVALUATION
HGB A: 96.4 % (ref 96.4–98.8)
HGB C: 0 %
HGB F QUANT: 1.4 % (ref 0.0–2.0)
HGB S QUANTITAION: 0 %
HGB VARIANT: 0 %
Hgb A2 Quant: 2.2 % (ref 1.8–3.2)

## 2017-09-05 ENCOUNTER — Telehealth: Payer: Self-pay | Admitting: Hematology and Oncology

## 2017-09-05 ENCOUNTER — Inpatient Hospital Stay: Payer: BLUE CROSS/BLUE SHIELD | Attending: Hematology and Oncology | Admitting: Hematology and Oncology

## 2017-09-05 DIAGNOSIS — D709 Neutropenia, unspecified: Secondary | ICD-10-CM | POA: Diagnosis not present

## 2017-09-05 DIAGNOSIS — Z79899 Other long term (current) drug therapy: Secondary | ICD-10-CM | POA: Diagnosis not present

## 2017-09-05 DIAGNOSIS — D509 Iron deficiency anemia, unspecified: Secondary | ICD-10-CM | POA: Diagnosis not present

## 2017-09-05 NOTE — Telephone Encounter (Signed)
Gave patient AVs and calendar of upcoming April and July appointments.  °

## 2017-09-05 NOTE — Assessment & Plan Note (Signed)
Workup: WBC 3.2, ANC 1.3 ANA negative B12 371 Folic acid 18  Recommended that the patient should get the bone marrow biopsy for further evaluation.  Patient is very paranoid about needles.  This could be challenging. 

## 2017-09-05 NOTE — Progress Notes (Signed)
Patient Care Team: Sherren Mocha, MD as PCP - General (Family Medicine) Cindee Salt, MD as Consulting Physician (Orthopedic Surgery)  DIAGNOSIS:  Encounter Diagnoses  Name Primary?  . Microcytic anemia   . Neutropenia, unspecified type (HCC)     CHIEF COMPLIANT: Follow-up of microcytic anemia and neutropenia  INTERVAL HISTORY: Diana [name] is a 62 year old with above-mentioned history of microcytic anemia neutropenia who underwent extensive blood work and she is here today to discuss the results.  Overall she does not feel any significantly worse.  She does have some fatigue.  Denies any fevers or chills.  Denies any nausea vomiting.  REVIEW OF SYSTEMS:   Constitutional: Denies fevers, chills or abnormal weight loss Eyes: Denies blurriness of vision Ears, nose, mouth, throat, and face: Denies mucositis or sore throat Respiratory: Denies cough, dyspnea or wheezes Cardiovascular: Denies palpitation, chest discomfort Gastrointestinal:  Denies nausea, heartburn or change in bowel habits Skin: Denies abnormal skin rashes Lymphatics: Denies new lymphadenopathy or easy bruising Neurological:Denies numbness, tingling or new weaknesses Behavioral/Psych: Mood is stable, no new changes  Extremities: No lower extremity edema Breast:  denies any pain or lumps or nodules in either breasts All other systems were reviewed with the patient and are negative.  I have reviewed the past medical history, past surgical history, social history and family history with the patient and they are unchanged from previous note.  ALLERGIES:  is allergic to penicillins and codeine.  MEDICATIONS:  Current Outpatient Medications  Medication Sig Dispense Refill  . cetirizine (ZYRTEC) 10 MG tablet Take 1 tablet (10 mg total) by mouth at bedtime. 90 tablet 3  . Cyanocobalamin (VITAMIN B-12) 1000 MCG SUBL Place 1 tablet (1,000 mcg total) under the tongue every morning. 360 tablet 0  . cyclobenzaprine (FLEXERIL)  10 MG tablet Take 1 tablet (10 mg total) by mouth at bedtime. For muscle spasms and may take 2 additional times as needed during the day for muscle spams 30 tablet 1  . fluticasone (FLONASE) 50 MCG/ACT nasal spray Place 2 sprays into both nostrils daily. 16 g 2  . gabapentin (NEURONTIN) 300 MG capsule Take 1 tab po qhs x 2 wk, then 1 tab po bid x 2 wk, then 1 tab po tid 90 capsule 3  . montelukast (SINGULAIR) 10 MG tablet Take 1 tablet (10 mg total) by mouth at bedtime. 90 tablet 3  . nitroGLYCERIN (NITROSTAT) 0.4 MG SL tablet Place 1 tablet (0.4 mg total) every 5 (five) minutes as needed under the tongue for chest pain. 30 tablet 2  . predniSONE (DELTASONE) 20 MG tablet Take 3 tabs po qam x 3d, then 2 tabs po qam x 3d, then 1 tab po qhs 18 tablet 0  . Vitamin D, Ergocalciferol, (DRISDOL) 50000 units CAPS capsule Take 1 capsule (50,000 Units total) every 7 (seven) days by mouth. 12 capsule 1   No current facility-administered medications for this visit.     PHYSICAL EXAMINATION: ECOG PERFORMANCE STATUS: 0 - Asymptomatic  Vitals:   09/05/17 1545  BP: (!) 145/94  Pulse: (!) 109  Resp: 18  Temp: 98 F (36.7 C)  SpO2: 100%   Filed Weights   09/05/17 1545  Weight: 131 lb 3.2 oz (59.5 kg)    GENERAL:alert, no distress and comfortable SKIN: skin color, texture, turgor are normal, no rashes or significant lesions EYES: normal, Conjunctiva are pink and non-injected, sclera clear OROPHARYNX:no exudate, no erythema and lips, buccal mucosa, and tongue normal  NECK: supple, thyroid normal  size, non-tender, without nodularity LYMPH:  no palpable lymphadenopathy in the cervical, axillary or inguinal LUNGS: clear to auscultation and percussion with normal breathing effort HEART: regular rate & rhythm and no murmurs and no lower extremity edema ABDOMEN:abdomen soft, non-tender and normal bowel sounds MUSCULOSKELETAL:no cyanosis of digits and no clubbing  NEURO: alert & oriented x 3 with fluent  speech, no focal motor/sensory deficits EXTREMITIES: No lower extremity edema BREAST: No palpable masses or nodules in either right or left breasts. No palpable axillary supraclavicular or infraclavicular adenopathy no breast tenderness or nipple discharge. (exam performed in the presence of a chaperone)  LABORATORY DATA:  I have reviewed the data as listed CMP Latest Ref Rng & Units 07/25/2017 01/15/2017 10/31/2016  Glucose 65 - 99 mg/dL 94 88 657(Q109(H)  BUN 6 - 24 mg/dL 20 17 10   Creatinine 0.57 - 1.00 mg/dL 4.69(G0.52(L) 2.95(M0.55(L) 8.410.47  Sodium 134 - 144 mmol/L 143 140 138  Potassium 3.5 - 5.2 mmol/L 4.3 4.6 3.7  Chloride 96 - 106 mmol/L 101 102 105  CO2 20 - 29 mmol/L 25 25 25   Calcium 8.7 - 10.2 mg/dL 9.4 9.9 3.2(G8.7(L)  Total Protein 6.0 - 8.5 g/dL 7.4 7.8 -  Total Bilirubin 0.0 - 1.2 mg/dL 0.3 0.4 -  Alkaline Phos 39 - 117 IU/L 77 85 -  AST 0 - 40 IU/L 20 33 -  ALT 0 - 32 IU/L 11 32 -    Lab Results  Component Value Date   WBC 3.2 (L) 08/25/2017   HGB 11.1 07/25/2017   HCT 33.8 (L) 08/25/2017   MCV 78.2 (L) 08/25/2017   PLT 168 08/25/2017   NEUTROABS 1.3 (L) 08/25/2017    ASSESSMENT & PLAN:  Microcytic anemia Hemoglobin 11.1 with MCV of 78 and normal iron studies Hemoglobin electrophoresis: Normal adult hemoglobin no evidence of beta thalassemia Based upon previous iron studies so she had a 15% iron saturation. Based on the normal hemoglobin electrophoresis, I suspect iron deficiency to be the primary etiology behind the microcytic anemia.  I recommended giving 2 dose of intravenous iron therapy instead of oral iron therapy.   Neutropenia (HCC) Workup: WBC 3.2, ANC 1.3 ANA negative B12 371 Folic acid 18  I recommended administration of IV iron therapy and rechecking her CBC and iron studies in 3 months and follow-up.  No orders of the defined types were placed in this encounter.  The patient has a good understanding of the overall plan. she agrees with it. she will call with any  problems that may develop before the next visit here.   Tamsen MeekViinay K Bhargav Barbaro, MD 09/05/17

## 2017-09-05 NOTE — Assessment & Plan Note (Signed)
Hemoglobin 11.1 with MCV of 78 and normal iron studies Hemoglobin electrophoresis: Normal adult hemoglobin no evidence of beta thalassemia  We will continue to monitor her iron studies

## 2017-09-08 ENCOUNTER — Other Ambulatory Visit: Payer: Self-pay | Admitting: *Deleted

## 2017-09-09 ENCOUNTER — Encounter: Payer: Self-pay | Admitting: *Deleted

## 2017-09-09 ENCOUNTER — Inpatient Hospital Stay: Payer: BLUE CROSS/BLUE SHIELD

## 2017-09-09 VITALS — BP 134/75 | HR 90 | Temp 97.8°F | Resp 16

## 2017-09-09 DIAGNOSIS — D509 Iron deficiency anemia, unspecified: Secondary | ICD-10-CM

## 2017-09-09 MED ORDER — SODIUM CHLORIDE 0.9 % IV SOLN
510.0000 mg | Freq: Once | INTRAVENOUS | Status: AC
  Administered 2017-09-09: 510 mg via INTRAVENOUS
  Filled 2017-09-09: qty 17

## 2017-09-09 MED ORDER — SODIUM CHLORIDE 0.9 % IV SOLN
Freq: Once | INTRAVENOUS | Status: AC
  Administered 2017-09-09: 10:00:00 via INTRAVENOUS

## 2017-09-09 NOTE — Patient Instructions (Signed)

## 2017-09-15 ENCOUNTER — Encounter: Payer: Self-pay | Admitting: *Deleted

## 2017-09-23 ENCOUNTER — Inpatient Hospital Stay: Payer: BLUE CROSS/BLUE SHIELD

## 2017-09-23 VITALS — BP 129/72 | HR 84 | Temp 97.0°F | Resp 18

## 2017-09-23 DIAGNOSIS — D509 Iron deficiency anemia, unspecified: Secondary | ICD-10-CM | POA: Diagnosis not present

## 2017-09-23 MED ORDER — FERUMOXYTOL INJECTION 510 MG/17 ML
510.0000 mg | Freq: Once | INTRAVENOUS | Status: AC
  Administered 2017-09-23: 510 mg via INTRAVENOUS
  Filled 2017-09-23: qty 17

## 2017-09-23 MED ORDER — SODIUM CHLORIDE 0.9 % IV SOLN
Freq: Once | INTRAVENOUS | Status: AC
  Administered 2017-09-23: 10:00:00 via INTRAVENOUS

## 2017-09-23 NOTE — Patient Instructions (Signed)

## 2017-11-04 ENCOUNTER — Ambulatory Visit: Payer: BLUE CROSS/BLUE SHIELD | Admitting: Physician Assistant

## 2017-11-04 ENCOUNTER — Other Ambulatory Visit: Payer: Self-pay

## 2017-11-04 ENCOUNTER — Encounter: Payer: Self-pay | Admitting: Physician Assistant

## 2017-11-04 VITALS — BP 124/76 | HR 93 | Temp 98.0°F | Resp 20 | Ht 63.39 in | Wt 128.2 lb

## 2017-11-04 DIAGNOSIS — M542 Cervicalgia: Secondary | ICD-10-CM | POA: Diagnosis not present

## 2017-11-04 DIAGNOSIS — R079 Chest pain, unspecified: Secondary | ICD-10-CM

## 2017-11-04 MED ORDER — PREDNISONE 20 MG PO TABS: ORAL_TABLET | ORAL | 0 refills | Status: DC

## 2017-11-04 MED ORDER — CYCLOBENZAPRINE HCL 10 MG PO TABS: 5.0000 mg | ORAL_TABLET | Freq: Three times a day (TID) | ORAL | 0 refills | Status: DC | PRN

## 2017-11-04 NOTE — Progress Notes (Signed)
Patient ID: Diana [name], female    DOB: 1955-12-06, 62 y.o.   MRN: [id number]  PCP: Sherren Mocha, MD  Chief Complaint  Patient presents with  . Shoulder Pain    X 3 weeks- both shoulders  . Foot Pain    X 3 weeks- both    Subjective:   Presents for evaluation of shoulder and foot pain, both for 3 weeks.  Shoulder pain is a recurrent problem for this patient.  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  When she develops pain, she uses OTC IcyHot and lidocaine patches but they have not afforded benefit this time.  The last episode resolved with an oral prednisone taper and cyclobenzaprine.  She hopes to receive that again.  She has not been able to afford physical therapy.  She has used Zanaflex and Robaxin in the past.  Her primary care provider recommended that she see a [facility].  Unfortunately, due to her work schedule she has not been able to coordinate that visit, understanding that she will need to be seen frequently during the first week of treatment.  She requests a note for her employer that will allow for such treatment. Works 2 am-12:30 pm, and 12:40 pm to 10 pm.  Patient had a non-STEMI 10/30/2016 but has not followed up with cardiology as recommended.  She has needed nitroglycerin for chest pain, most recently about 10 days ago.  Her microcytic anemia is followed by hematology.   Review of Systems Constitutional: Negative for chills, fatigue and fever.  HENT: Negative for congestion.   Respiratory: Negative for cough, chest tightness and shortness of breath.   Cardiovascular: Positive for chest pain (last tuesday had to use nitrostat) and palpitations.  Gastrointestinal: Negative for blood in stool, constipation, diarrhea, nausea and vomiting.  Genitourinary: Negative for difficulty urinating, dysuria and hematuria.    Musculoskeletal: Positive for gait problem (limp when foot is aggravated), myalgias, neck pain and neck stiffness. Negative for back pain and joint swelling.  Neurological: Positive for numbness (right hand intermittently). Negative for dizziness, weakness, light-headedness and headaches.       Patient Active Problem List   Diagnosis Date Noted  . Iron deficiency anemia 09/05/2017  . Neutropenia (HCC) 08/25/2017  . Microcytic anemia 08/25/2017  . Vitamin D insufficiency 03/18/2017  . Unstable angina (HCC) 10/30/2016  . Non-ST elevation (NSTEMI) myocardial infarction (HCC) 10/30/2016  . Bronchitis 10/30/2016  . NSTEMI (non-ST elevated myocardial infarction) (HCC) 10/30/2016  . Carpal tunnel syndrome 12/18/2015  . Unilateral osteoarthritis of first carpometacarpal (CMC) joint 12/18/2015  . Cervicalgia 12/18/2015  . Benign essential HTN 04/08/2015  . Metatarsalgia of right foot 02/11/2015  . Degeneration of intervertebral disc of lumbar region 12/18/2014  . Acid reflux 12/18/2014     Prior to Admission medications   Medication Sig Start Date End Date Taking? Authorizing Provider  cyclobenzaprine (FLEXERIL) 10 MG tablet Take 1 tablet (10 mg total) by mouth at bedtime. For muscle spasms and may take 2 additional times as needed during the day for muscle spams 08/27/17  Yes Sherren Mocha, MD  fluticasone [facility]) 50 MCG/ACT nasal spray Place 2 sprays into both nostrils daily. 12/06/16  Yes Sherren Mocha, MD  gabapentin (NEURONTIN) 300 MG capsule Take 1 tab po qhs x 2 wk, then 1 tab po bid x 2 wk, then 1 tab  po tid 08/27/17  Yes Sherren Mocha, MD  nitroGLYCERIN (NITROSTAT) 0.4 MG SL tablet Place 1 tablet (0.4 mg total) every 5 (five) minutes as needed under the tongue for chest pain. 04/04/17  Yes Sherren Mocha, MD  Vitamin D, Ergocalciferol, (DRISDOL) 50000 units CAPS capsule Take 1 capsule (50,000 Units total) every 7 (seven) days by mouth. 04/04/17  Yes Sherren Mocha, MD     Allergies  Allergen  Reactions  . Penicillins Rash    Has patient had a PCN reaction causing immediate rash, facial/tongue/throat swelling, SOB or lightheadedness with hypotension: yes Has patient had a PCN reaction causing severe rash involving mucus membranes or skin necrosis:  no Has patient had a PCN reaction that required hospitalization:  no Has patient had a PCN reaction occurring within the last 10 years: no If all of the above answers are "NO", then may proceed with Cephalosporin use.   . Codeine Anxiety       Objective:  Physical Exam  Constitutional: She is oriented to person, place, and time. She appears well-developed and well-nourished. She is active and cooperative. No distress.  BP 124/76 (BP Location: Left Arm, Patient Position: Sitting, Cuff Size: Normal)   Pulse 93   Temp 98 F (36.7 C) (Oral)   Resp 20   Ht 5' 3.39" (1.61 m)   Wt 128 lb 3.2 oz (58.2 kg)   SpO2 100%   BMI 22.43 kg/m   HENT:  Head: Normocephalic and atraumatic.  Right Ear: Hearing normal.  Left Ear: Hearing normal.  Eyes: Conjunctivae are normal. No scleral icterus.  Neck: Normal range of motion. Neck supple. No thyromegaly present.  Cardiovascular: Normal rate, regular rhythm and normal heart sounds.  Pulses:      Radial pulses are 2+ on the right side, and 2+ on the left side.  Pulmonary/Chest: Effort normal and breath sounds normal.  Musculoskeletal:       Left ankle: Normal.       Cervical back: She exhibits tenderness (LEFT cervical paraspinous muscles and trapezius) and pain. She exhibits normal range of motion, no bony tenderness, no swelling, no edema, no deformity, no laceration, no spasm and normal pulse.       Thoracic back: Normal.       Lumbar back: Normal.       Left foot: There is normal range of motion, no tenderness, no bony tenderness, no swelling, normal capillary refill, no crepitus, no deformity and no laceration.  Lymphadenopathy:       Head (right side): No tonsillar, no preauricular, no  posterior auricular and no occipital adenopathy present.       Head (left side): No tonsillar, no preauricular, no posterior auricular and no occipital adenopathy present.    She has no cervical adenopathy.       Right: No supraclavicular adenopathy present.       Left: No supraclavicular adenopathy present.  Neurological: She is alert and oriented to person, place, and time. No sensory deficit.  Skin: Skin is warm, dry and intact. No rash noted. No cyanosis or erythema. Nails show no clubbing.  Psychiatric: She has a normal mood and affect. Her speech is normal and behavior is normal.           Assessment & Plan:   Problem List Items Addressed This Visit    Cervicalgia - Primary    Repeat previously effective treatment: Oral prednisone taper and cyclobenzaprine.  Work note for the next week.  This should  allow her to schedule with a chiropractor.  Stressed the importance of self-care.  She is not getting adequate sleep.  Not getting adequate physical rest either.  She does not currently feel that she can reduce hours at either of her jobs, despite the incredible physical and emotional stress.      Relevant Medications   predniSONE (DELTASONE) 20 MG tablet   cyclobenzaprine (FLEXERIL) 10 MG tablet    Other Visit Diagnoses    Chest pain, unspecified type       No follow-up with cardiology after non-STEMI 12 months ago.  Concerning regular nitroglycerin use.  Advised she schedule follow-up with cardiology.       Return in 10 days (on 11/14/2017) for re-evaluation with Dr. Clelia CroftShaw.   Fernande Brashelle S. Jaynell Castagnola, PA-C Primary Care at [facility]

## 2017-11-04 NOTE — Patient Instructions (Addendum)
Please call Dr. Erin Hearingroitoru's office to schedule a follow-up there.    IF you received an x-ray today, you will receive an invoice from [facility]. Please contact [facility] at [phone] with questions or concerns regarding your invoice.   IF you received labwork today, you will receive an invoice from [facility]. Please contact [facility] at [phone] with questions or concerns regarding your invoice.   Our billing staff will not be able to assist you with questions regarding bills from these companies.  You will be contacted with the lab results as soon as they are available. The fastest way to get your results is to activate your My Chart account. Instructions are located on the last page of this paperwork. If you have not heard from us regarding the results in 2 weeks, please contact this office.    We recommend that you schedule a mammogram for breast cancer screening. Typically, you do not need a referral to do this. Please contact a local imaging center to schedule your mammogram.  [facility] - [phone]  *ask for the Radiology Department [facility] - [phone] or [phone]  [facility] - [phone] [facility] - [phone] [facility] - [phone]  *ask for the Radiology Department [facility] - [phone]  *ask for the Radiology Department [facility] - [phone]  *ask for the Mammography Department [facility] - [phone]

## 2017-11-04 NOTE — Progress Notes (Signed)
Subjective:    Patient ID: Diana [name], female    DOB: 09/15/1955, 62 y.o.   MRN: [id number]  Diana [name] is a 62 year old female presenting for evaluation of shoulder and foot pain. She states that both shoulders, her neck (particularly when turning her head to the right), and the first three toes on the left foot has been hurting for the past 3 weeks. 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 She has to consistently carry heavy equipment up/down stairs, across parking lots, and into vehicles. She also uses icy hot and lidocaine patches for muscle pain, but they have not brought relief as of late. In the past, Dr. Clelia CroftShaw has prescribed prednisone and flexeril which she confirms helped in addition to taking gabapentin afterwards. A previous different combination of muscle relaxer + steroid made her head spin.  Was supposed to make appointment with a chiropractor per Dr. Alver FisherShaw's recommendation; however, she has been unable to due to her work schedule and one of her employers being unwilling to give her time off. She is requesting a work note for a longer amount of time to be able to make an appointment.  Of note, she mentions an MI one year ago. She has been taking nitroglycerin when chest pain arises (states she only ever needs 1 tablet per episode) with the most recent episode being a week and a half ago ("last Tuesday"). She has not seen her cardiologist since about a year ago.     Review of Systems  Constitutional: Negative for chills, fatigue and fever.  HENT: Negative for congestion.   Respiratory: Negative for cough, chest tightness and shortness of breath.   Cardiovascular: Positive for chest pain (last tuesday had to use nitrostat) and palpitations.  Gastrointestinal: Negative for blood in stool, constipation, diarrhea, nausea and vomiting.    Genitourinary: Negative for difficulty urinating, dysuria and hematuria.  Musculoskeletal: Positive for gait problem (limp when foot is aggravated), myalgias, neck pain and neck stiffness. Negative for back pain and joint swelling.  Neurological: Positive for numbness (right hand intermittently). Negative for dizziness, weakness, light-headedness and headaches.   Patient Active Problem List   Diagnosis Date Noted  . Iron deficiency anemia 09/05/2017  . Neutropenia (HCC) 08/25/2017  . Microcytic anemia 08/25/2017  . Vitamin D insufficiency 03/18/2017  . Unstable angina (HCC) 10/30/2016  . Non-ST elevation (NSTEMI) myocardial infarction (HCC) 10/30/2016  . Bronchitis 10/30/2016  . NSTEMI (non-ST elevated myocardial infarction) (HCC) 10/30/2016  . Carpal tunnel syndrome 12/18/2015  . Unilateral osteoarthritis of first carpometacarpal (CMC) joint 12/18/2015  . Cervicalgia 12/18/2015  . Benign essential HTN 04/08/2015  . Metatarsalgia of right foot 02/11/2015  . Degeneration of intervertebral disc of lumbar region 12/18/2014  . Acid reflux 12/18/2014   Prior to Admission medications   Medication Sig Start Date End Date Taking? Authorizing Provider  cyclobenzaprine (FLEXERIL) 10 MG tablet Take 0.5-1 tablets (5-10 mg total) by mouth 3 (three) times daily as needed for muscle spasms. 11/04/17  Yes Jeffery, Chelle, PA-C  fluticasone (FLONASE) 50 MCG/ACT nasal spray Place 2 sprays into both nostrils daily. 12/06/16  Yes Sherren MochaShaw, Eva N, MD  gabapentin (NEURONTIN) 300 MG capsule Take 1 tab po qhs x 2 wk, then 1 tab po bid x 2 wk, then 1 tab po tid 08/27/17  Yes Sherren MochaShaw, Eva N, MD  nitroGLYCERIN (NITROSTAT) 0.4 MG SL tablet  Place 1 tablet (0.4 mg total) every 5 (five) minutes as needed under the tongue for chest pain. 04/04/17  Yes Sherren Mocha, MD  Vitamin D, Ergocalciferol, (DRISDOL) 50000 units CAPS capsule Take 1 capsule (50,000 Units total) every 7 (seven) days by mouth. 04/04/17  Yes Sherren Mocha, MD   predniSONE (DELTASONE) 20 MG tablet Take 3 PO QAM x3days, 2 PO QAM x3days, 1 PO QAM x3days 11/04/17   Porfirio Oar, PA-C   Allergies  Allergen Reactions  . Penicillins Rash    Has patient had a PCN reaction causing immediate rash, facial/tongue/throat swelling, SOB or lightheadedness with hypotension: yes Has patient had a PCN reaction causing severe rash involving mucus membranes or skin necrosis:  no Has patient had a PCN reaction that required hospitalization:  no Has patient had a PCN reaction occurring within the last 10 years: no If all of the above answers are "NO", then may proceed with Cephalosporin use.   . Codeine Anxiety       Objective:   Physical Exam  Constitutional: She appears well-developed and well-nourished.  HENT:  Head: Normocephalic and atraumatic.  Eyes: Pupils are equal, round, and reactive to light.  Neck: No thyromegaly present.  Decreased strength turning to right  Cardiovascular: Normal rate and regular rhythm. Exam reveals no gallop and no friction rub.  No murmur heard. Pulmonary/Chest: Effort normal and breath sounds normal. No stridor. She has no wheezes. She has no rales.  Musculoskeletal: She exhibits tenderness (posterior shoulder/trapezius).       Right shoulder: She exhibits decreased range of motion and tenderness.       Left shoulder: She exhibits tenderness.       Left ankle: She exhibits decreased range of motion.          Assessment & Plan:  1. Cervicalgia Provided work note for time off until Tuesday 11/15/17 unless Dr. Clelia Croft recommends longer at follow up appointment on 11/14/17. - predniSONE (DELTASONE) 20 MG tablet; Take 3 PO QAM x3days, 2 PO QAM x3days, 1 PO QAM x3days  Dispense: 18 tablet; Refill: 0 - cyclobenzaprine (FLEXERIL) 10 MG tablet; Take 0.5-1 tablets (5-10 mg total) by mouth 3 (three) times daily as needed for muscle spasms.  Dispense: 30 tablet; Refill: 0 Continue taking Gabapentin.  Make follow up appointment with  cardiology. Schedule appointment for chiropractic evaluation.

## 2017-11-05 ENCOUNTER — Encounter: Payer: Self-pay | Admitting: Physician Assistant

## 2017-11-05 NOTE — Assessment & Plan Note (Signed)
Repeat previously effective treatment: Oral prednisone taper and cyclobenzaprine.  Work note for the next week.  This should allow her to schedule with a chiropractor.  Stressed the importance of self-care.  She is not getting adequate sleep.  Not getting adequate physical rest either.  She does not currently feel that she can reduce hours at either of her jobs, despite the incredible physical and emotional stress.

## 2017-11-07 ENCOUNTER — Other Ambulatory Visit: Payer: Self-pay | Admitting: Family Medicine

## 2017-11-07 ENCOUNTER — Telehealth: Payer: Self-pay | Admitting: Family Medicine

## 2017-11-07 DIAGNOSIS — Z1231 Encounter for screening mammogram for malignant neoplasm of breast: Secondary | ICD-10-CM

## 2017-11-08 ENCOUNTER — Telehealth: Payer: Self-pay

## 2017-11-08 DIAGNOSIS — I252 Old myocardial infarction: Secondary | ICD-10-CM

## 2017-11-08 DIAGNOSIS — I200 Unstable angina: Secondary | ICD-10-CM

## 2017-11-08 LAB — HM MAMMOGRAPHY

## 2017-11-08 NOTE — Telephone Encounter (Signed)
Please advise. We will need referral placed to send pt to different cardiologist. I left pt a vm letting her know I would place message and asked if she had a specific cardiologist she would like to see to let us know. Thanks!

## 2017-11-08 NOTE — Telephone Encounter (Signed)
Please review : prednisone 20 mg tabs Looks like completed course of meds done.  LOV  11/04/17 NOV  11/14/17 Last refill  11/04/17 for 20 mg tabs for #18 tablets [id number] Dr. Clelia CroftShaw

## 2017-11-08 NOTE — Telephone Encounter (Signed)
Copied from CRM [id number]. Topic: General - Other >> Nov 07, 2017 12:49 PM Elliot GaultBell, Tiffany M wrote: Relation to pt: self Call back number:[phone] Pharmacy: [id number] - [place], [place] - [address] [facility] [address] [phone] (Phone) [phone] (Fax)   Reason for call:  Patient last seen by Chelle,Jeffrey on 11/04/17 and states pharmacy didn't have predniSONE (DELTASONE) 20 MG tablet in stock therefore they prescribed 10 MG and patient states it was to many pills to take because she had to dbl up on the 10MG . Patient states pharmacy has 20 MG in stock now and would like a new Rx sent to pharmacy today, please advise  >> Nov 07, 2017 12:55 PM Elliot GaultBell, Tiffany M wrote: Relation to pt: self Call back number:[phone] Pharmacy: [facility] [id number] - [place], [place] - [address] [facility] [address] [phone]) [phone])   Reason for call:  Patient last seen by Chelle,Jeffrey on 11/04/17 and states pharmacy didn't have predniSONE (DELTASONE) 20 MG tablet in stock therefore they prescribed 10 MG and patient states it was to many pills to take because she had to dbl up on the 10MG . Patient states pharmacy has 20 MG in stock now and would like a new Rx sent to pharmacy today, please advise

## 2017-11-08 NOTE — Telephone Encounter (Signed)
Pt calling back about the RX prednisone 

## 2017-11-08 NOTE — Telephone Encounter (Signed)
Pharmacy already disepensed 10mg  tablet.

## 2017-11-08 NOTE — Telephone Encounter (Signed)
Orders Placed This Encounter  Procedures   Ambulatory referral to Cardiology    Referral Priority:   Routine    Referral Type:   Consultation    Referral Reason:   Specialty Services Required    Requested Specialty:   Cardiology    Number of Visits Requested:   1    

## 2017-11-08 NOTE — Telephone Encounter (Signed)
Patient needs office visit to refill Prednisone. Please call patient to schedule.

## 2017-11-08 NOTE — Telephone Encounter (Signed)
Copied from CRM #[id number]. Topic: General - Other >> Nov 07, 2017 12:49 PM Bell, Tiffany M wrote: Relation to pt: self Call back number:[phone] Pharmacy: [facility] - [place], [place] - [address] AT [address] [phone] (Fax)   Reason for call:  Patient last seen by Chelle,Jeffrey on 11/04/17 and states pharmacy didn't have predniSONE (DELTASONE) 20 MG tablet in stock therefore they prescribed 10 MG and patient states it was to many pills to take because she had to dbl up on the 10MG. Patient states pharmacy has 20 MG in stock now and would like a new Rx sent to pharmacy today, please advise  >> Nov 07, 2017 12:55 PM Bell, Tiffany M wrote: Relation to pt: self Call back number:[phone] Pharmacy: [facility] - [place], [place] - [address] AT [address] [phone] (Fax)   Reason for call:  Patient last seen by Chelle,Jeffrey on 11/04/17 and states pharmacy didn't have predniSONE (DELTASONE) 20 MG tablet in stock therefore they prescribed 10 MG and patient states it was to many pills to take because she had to dbl up on the 10MG. Patient states pharmacy has 20 MG in stock now and would like a new Rx sent to pharmacy today, please advise  

## 2017-11-08 NOTE — Telephone Encounter (Signed)
Copied from CRM #[id number]. Topic: General - Other >> Nov 07, 2017 12:49 PM Bell, Tiffany M wrote: Relation to pt: self Call back number:[phone] Pharmacy: [facility] - [place], [place] - [address] AT [address] [phone]) [phone] (Fax)   Reason for call:  Patient last seen by Chelle,Jeffrey on 11/04/17 and states pharmacy didn't have predniSONE (DELTASONE) 20 MG tablet in stock therefore they prescribed 10 MG and patient states it was to many pills to take because she had to dbl up on the 10MG. Patient states pharmacy has 20 MG in stock now and would like a new Rx sent to pharmacy today, please advise  >> Nov 07, 2017 12:55 PM Bell, Tiffany M wrote: Relation to pt: self Call back number:[phone] Pharmacy: [facility] - [place], [place] - [address] AT [address] [phone]) [phone] (Fax)   Reason for call:  Patient last seen by Chelle,Jeffrey on 11/04/17 and states pharmacy didn't have predniSONE (DELTASONE) 20 MG tablet in stock therefore they prescribed 10 MG and patient states it was to many pills to take because she had to dbl up on the 10MG. Patient states pharmacy has 20 MG in stock now and would like a new Rx sent to pharmacy today, please advise  

## 2017-11-08 NOTE — Telephone Encounter (Signed)
Copied from CRM [id number]. Topic: Referral - Request >> Nov 07, 2017 11:18 AM Lorrine KinMcGee, Demi B, NT wrote: Reason for CRM: Patient calling and states that she would like a different Cardiology referral placed. States that she would like to to NOT be in with [facility]. Please advise. CB#: [phone] >> Nov 08, 2017  6:43 AM Gloriann LoanPayne, Angela L wrote: Pt calling to check the status on the referral to a cardiologist it [facility]'t be at [facility] she is out from work this week pt states that Chelle wanted her to go to the referral appt before her appt on Monday

## 2017-11-08 NOTE — Addendum Note (Signed)
Addended by: Porfirio OarJEFFERY, Remedios Mckone S on: 11/08/2017 08:00 PM   Modules accepted: Orders

## 2017-11-09 ENCOUNTER — Telehealth: Payer: Self-pay | Admitting: Physician Assistant

## 2017-11-09 NOTE — Telephone Encounter (Signed)
Spoke with pt concerning cardiology referral. Let her know we sent this to Dr. Othella BoyerWilliam Tilley's office due to him having more openings sooner. I told pt if she could not get in with him this week before 6/17 appt here, to let us know so we can see if we need to push back appt with us or how provider would like to proceed. Pt also inquired again about prednisone prescription. She said she has appointment with the chiropractor tomorrow and was hoping to have the 20MG  prescription so she could be taking this before her appt. Pt is not currently taking the 10MG  because she wanted to see if she could get the 20MG  prescribed. Please advise pt on status of prescription.

## 2017-11-09 NOTE — Telephone Encounter (Signed)
Pt also asked if appt is switched to Wednesday, can she have an extended note that says she can go back to work on Thursday 11/17/17 instead of current note that says Tuesday 11/15/17 since she would not see Dr. Clelia CroftShaw until Wednesday. Thanks!

## 2017-11-09 NOTE — Telephone Encounter (Signed)
Left detailed VM for patient advising that she has already picked up 20mg  dose. Dose was given as 10mg  tablets in pharmacy.

## 2017-11-09 NOTE — Telephone Encounter (Signed)
Pt is scheduled to see Dr. Donnie Ahoilley on Tuesday 11/15/17, as this was the earliest pt could get in. Would it be okay to schedule pt for follow up here for Wednesday 6/19 after cardiology appt instead of scheduled appt on 6/17? If so, is it okay to use Same Day slot for pt? I told pt I will give her a call to let her know how to proceed, Thanks!

## 2017-11-14 ENCOUNTER — Encounter: Payer: Self-pay | Admitting: Family Medicine

## 2017-11-14 ENCOUNTER — Other Ambulatory Visit: Payer: Self-pay

## 2017-11-14 ENCOUNTER — Ambulatory Visit: Payer: BLUE CROSS/BLUE SHIELD | Admitting: Family Medicine

## 2017-11-14 VITALS — BP 128/84 | HR 104 | Temp 98.0°F | Resp 16 | Ht 62.0 in | Wt 130.0 lb

## 2017-11-14 DIAGNOSIS — Z1231 Encounter for screening mammogram for malignant neoplasm of breast: Secondary | ICD-10-CM

## 2017-11-14 DIAGNOSIS — Z1212 Encounter for screening for malignant neoplasm of rectum: Secondary | ICD-10-CM

## 2017-11-14 DIAGNOSIS — M25512 Pain in left shoulder: Secondary | ICD-10-CM

## 2017-11-14 DIAGNOSIS — I208 Other forms of angina pectoris: Secondary | ICD-10-CM | POA: Diagnosis not present

## 2017-11-14 DIAGNOSIS — R208 Other disturbances of skin sensation: Secondary | ICD-10-CM | POA: Insufficient documentation

## 2017-11-14 DIAGNOSIS — G8929 Other chronic pain: Secondary | ICD-10-CM | POA: Insufficient documentation

## 2017-11-14 DIAGNOSIS — M25511 Pain in right shoulder: Secondary | ICD-10-CM | POA: Diagnosis not present

## 2017-11-14 DIAGNOSIS — M546 Pain in thoracic spine: Secondary | ICD-10-CM

## 2017-11-14 DIAGNOSIS — M79671 Pain in right foot: Secondary | ICD-10-CM | POA: Diagnosis not present

## 2017-11-14 DIAGNOSIS — Z1211 Encounter for screening for malignant neoplasm of colon: Secondary | ICD-10-CM

## 2017-11-14 DIAGNOSIS — Z124 Encounter for screening for malignant neoplasm of cervix: Secondary | ICD-10-CM

## 2017-11-14 DIAGNOSIS — M542 Cervicalgia: Secondary | ICD-10-CM | POA: Diagnosis not present

## 2017-11-14 DIAGNOSIS — R3121 Asymptomatic microscopic hematuria: Secondary | ICD-10-CM | POA: Diagnosis not present

## 2017-11-14 DIAGNOSIS — R200 Anesthesia of skin: Secondary | ICD-10-CM

## 2017-11-14 DIAGNOSIS — M79672 Pain in left foot: Secondary | ICD-10-CM

## 2017-11-14 DIAGNOSIS — I252 Old myocardial infarction: Secondary | ICD-10-CM | POA: Diagnosis not present

## 2017-11-14 DIAGNOSIS — I2089 Other forms of angina pectoris: Secondary | ICD-10-CM

## 2017-11-14 LAB — POCT URINALYSIS DIP (MANUAL ENTRY)
BILIRUBIN UA: NEGATIVE
GLUCOSE UA: NEGATIVE mg/dL
Ketones, POC UA: NEGATIVE mg/dL
Leukocytes, UA: NEGATIVE
Nitrite, UA: NEGATIVE
Protein Ur, POC: NEGATIVE mg/dL
SPEC GRAV UA: 1.02 (ref 1.010–1.025)
UROBILINOGEN UA: 0.2 U/dL
pH, UA: 7 (ref 5.0–8.0)

## 2017-11-14 LAB — POC MICROSCOPIC URINALYSIS (UMFC): MUCUS RE: ABSENT

## 2017-11-14 MED ORDER — PREDNISONE 20 MG PO TABS: ORAL_TABLET | ORAL | 0 refills | Status: DC

## 2017-11-14 MED ORDER — GABAPENTIN 400 MG PO CAPS: 400.0000 mg | ORAL_CAPSULE | Freq: Four times a day (QID) | ORAL | 2 refills | Status: DC

## 2017-11-14 NOTE — Telephone Encounter (Signed)
Sorry - didn't see note until way to late as her appt is sched for today at 2 pm - but do want her to keep today's appt w/ me - no need to defer to after cardiology visit.  THANKS

## 2017-11-14 NOTE — Progress Notes (Signed)
Subjective:  By signing my name below, I, Diana [name], attest that this documentation has been prepared under the direction and in the presence of Norberto SorensonEva Shaw, MD Electronically Signed: Charline BillsEssence [name], ED Scribe 11/14/2017 at 2:33 PM.   Patient ID: Diana MuscaPamela M [name], female    DOB: 01/11/1956, 62 y.o.   MRN: [id number]  Chief Complaint  Patient presents with  . Follow-up    left foot, neck , and shoulders   HPI Diana [name] is a 62 y.o. female who presents to Primary Care at [facility] for f/u on L foot, neck and shoulder pain.  Pt saw Chelle last wk for flare-up of R shoulder pain. She states that Chelle prescribed her 20 mg Prednisone, however, the pharmacy only had 10 mg tabs so she had to double up on tabs. States that her stomach could not tolerate all of the pills at one time so she has not been taking 20 mg qd. Pt has also been taking Flexeril and gabapentin which she states provides some relief. She has been seeing chiropractor Dr. Melvyn NethLewis; saw her 3 times last wk and has an appointment scheduled for today as well around 4:45. She requests FMLA forms to be filled out today for R shoulder pain.  She also states that she followed up with [facility] and was advised that she needs surgery. Her next appointment is in 2 days to schedule surgery. She is hoping for the earliest surgery date.  Pt sees cardiologist Dr. Sondra Comeiley tomorrow.  Past Medical History:  Diagnosis Date  . Allergy   . Arthritis    feet  . Bronchitis 10/30/2016  . Carpal tunnel syndrome   . Non-ST elevation (NSTEMI) myocardial infarction (HCC) 10/30/2016  . NSTEMI (non-ST elevated myocardial infarction) (HCC) 10/30/2016   Current Outpatient Medications on File Prior to Visit  Medication Sig Dispense Refill  . cyclobenzaprine (FLEXERIL) 10 MG tablet Take 0.5-1 tablets (5-10 mg total) by mouth 3 (three) times daily as needed for muscle spasms. 30 tablet 0  . fluticasone (FLONASE) 50 MCG/ACT nasal spray Place 2 sprays into  both nostrils daily. 16 g 2  . gabapentin (NEURONTIN) 300 MG capsule Take 1 tab po qhs x 2 wk, then 1 tab po bid x 2 wk, then 1 tab po tid 90 capsule 3  . nitroGLYCERIN (NITROSTAT) 0.4 MG SL tablet Place 1 tablet (0.4 mg total) every 5 (five) minutes as needed under the tongue for chest pain. 30 tablet 2  . Vitamin D, Ergocalciferol, (DRISDOL) 50000 units CAPS capsule Take 1 capsule (50,000 Units total) every 7 (seven) days by mouth. 12 capsule 1   No current facility-administered medications on file prior to visit.    Past Surgical History:  Procedure Laterality Date  . CARPAL TUNNEL RELEASE Left 02/12/2016   Procedure: LEFT CARPAL TUNNEL RELEASE;  Surgeon: Cindee SaltGary Kuzma, MD;  Location: [facility];  Service: Orthopedics;  Laterality: Left;  FAB  . TONSILLECTOMY    . TUBAL LIGATION    . WRIST SURGERY Right    Allergies  Allergen Reactions  . Penicillins Rash    Has patient had a PCN reaction causing immediate rash, facial/tongue/throat swelling, SOB or lightheadedness with hypotension: yes Has patient had a PCN reaction causing severe rash involving mucus membranes or skin necrosis:  no Has patient had a PCN reaction that required hospitalization:  no Has patient had a PCN reaction occurring within the last 10 years: no If all of the above answers  are "NO", then may proceed with Cephalosporin use.   . Codeine Anxiety   Family History  Problem Relation Age of Onset  . Thyroid disease Mother   . Brain cancer Sister    Social History   Socioeconomic History  . Marital status: Divorced    Spouse name: Alcario Drought  . Number of children: 2  . Years of education: 12th grade  . Highest education level: Not on file  Occupational History  . Occupation: Warden/ranger: [employer]  Social Needs  . Financial resource strain: Not on file  . Food insecurity:    Worry: Not on file    Inability: Not on file  . Transportation needs:    Medical: Not on file     Non-medical: Not on file  Tobacco Use  . Smoking status: Never Smoker  . Smokeless tobacco: Never Used  Substance and Sexual Activity  . Alcohol use: No  . Drug use: No  . Sexual activity: Not Currently  Lifestyle  . Physical activity:    Days per week: Not on file    Minutes per session: Not on file  . Stress: Not on file  Relationships  . Social connections:    Talks on phone: Not on file    Gets together: Not on file    Attends religious service: Not on file    Active member of club or organization: Not on file    Attends meetings of clubs or organizations: Not on file    Relationship status: Not on file  Other Topics Concern  . Not on file  Social History Narrative   Divorced from her husband. She has two adult sons from that union . One lives in [place], the other lives in [place] (as a Solicitor) following Office manager.   Married Alcario Drought 05/2014.   Depression screen [facility] 2/9 11/14/2017 11/04/2017 04/04/2017 03/18/2017 01/15/2017  Decreased Interest 0 0 0 0 0  Down, Depressed, Hopeless 0 0 0 0 0  PHQ - 2 Score 0 0 0 0 0     Review of Systems  Musculoskeletal: Positive for arthralgias and myalgias.      Objective:   Physical Exam  Constitutional: She is oriented to person, place, and time. She appears well-developed and well-nourished. No distress.  HENT:  Head: Normocephalic and atraumatic.  Eyes: Conjunctivae and EOM are normal.  Neck: Neck supple. No tracheal deviation present.  Cardiovascular: Regular rhythm. Tachycardia present.  Pulmonary/Chest: Effort normal and breath sounds normal. No respiratory distress.  Musculoskeletal: Normal range of motion.  Neurological: She is alert and oriented to person, place, and time.  Skin: Skin is warm and dry.  Psychiatric: She has a normal mood and affect. Her behavior is normal.  Nursing note and vitals reviewed.  BP (!) 132/92 (BP Location: Left Arm, Patient Position: Sitting, Cuff Size: Normal)   Pulse (!) 104   Temp 98 F  (36.7 C) (Oral)   Resp 16   Ht 5\' 2"  (1.575 m)   Wt 130 lb (59 kg)   SpO2 99%   BMI 23.78 kg/m     Results for orders placed or performed in visit on 11/14/17  POCT urinalysis dipstick  Result Value Ref Range   Color, UA yellow yellow   Clarity, UA clear clear   Glucose, UA negative negative mg/dL   Bilirubin, UA negative negative   Ketones, POC UA negative negative mg/dL   Spec Grav, UA 2.952 8.413 - 1.025   Blood,  UA trace-lysed (A) negative   pH, UA 7.0 5.0 - 8.0   Protein Ur, POC negative negative mg/dL   Urobilinogen, UA 0.2 0.2 or 1.0 E.U./dL   Nitrite, UA Negative Negative   Leukocytes, UA Negative Negative  POCT Microscopic Urinalysis ([facility])  Result Value Ref Range   WBC,UR,HPF,POC None None WBC/hpf   RBC,UR,HPF,POC None None RBC/hpf   Bacteria None None, Too numerous to count   Mucus Absent Absent   Epithelial Cells, UR Per Microscopy None None, Too numerous to count cells/hpf  POC Hemoccult Bld/Stl (3-Cd Home Screen)  Result Value Ref Range   Card #1 Date 11/14/17    Fecal Occult Blood, POC Negative Negative   Card #2 Date 11/15/17    Card #2 Fecal Occult Blod, POC Negative    Card #3 Date 11/16/17    Card #3 Fecal Occult Blood, POC Negative     Assessment & Plan:   1. History of non-ST elevation myocardial infarction (NSTEMI)   2. Angina of effort (HCC)   3. Cervicalgia   4. Chronic pain of both shoulders - taken out of work on [employer] so she could start chiropractor trx 3x/wk (also using the week to see cardiology due to h/o MI, see podiatry, f/u here, etc - so 1-2 doctors appts daily)!  Brought FMLA papers into visit with her so they could be completed today during her visit.  5. Bilateral foot pain - seeing podiatry and thinks she will need surgery - will likely need more FMLA time for that so limit current so that she does not run out.  6. Numbness of left foot   7. Chronic thoracic spine pain   8. Asymptomatic microscopic hematuria   9. Screening for  cervical cancer   10. Screening for colorectal cancer   11. Encounter for screening mammogram for breast cancer     Orders Placed This Encounter  Procedures  . POC Hemoccult Bld/Stl (3-Cd Home Screen)    Standing Status:   Future    Number of Occurrences:   1    Standing Expiration Date:   11/15/2018  . POCT urinalysis dipstick  . POCT Microscopic Urinalysis ([facility])    Meds ordered this encounter  Medications  . predniSONE (DELTASONE) 20 MG tablet    Sig: Take 3 PO QAM x3days, 2 PO QAM x3days, 1 PO QAM x3days    Dispense:  18 tablet    Refill:  0  . gabapentin (NEURONTIN) 400 MG capsule    Sig: Take 1 capsule (400 mg total) by mouth 4 (four) times daily.    Dispense:  120 capsule    Refill:  2    I personally performed the services described in this documentation, which was scribed in my presence. The recorded information has been reviewed and considered, and addended by me as needed.   Norberto Sorenson, M.D.  Primary Care at [facility] [address] [place], [place] [address] [phone] phone [phone] fax  12/03/17 12:40 AM

## 2017-11-14 NOTE — Patient Instructions (Addendum)
IF you received an x-ray today, you will receive an invoice from [facility]. Please contact [facility] at [phone] with questions or concerns regarding your invoice.   IF you received labwork today, you will receive an invoice from [facility]. Please contact [facility] at [phone] with questions or concerns regarding your invoice.   Our billing staff will not be able to assist you with questions regarding bills from these companies.  You will be contacted with the lab results as soon as they are available. The fastest way to get your results is to activate your My Chart account. Instructions are located on the last page of this paperwork. If you have not heard from [place] regarding the results in 2 weeks, please contact this office.     What You Need to Know About Chronic Back Pain Long-term (chronic) back pain is back pain that lasts for 12 weeks or longer. It often affects the lower back and can range from mild to severe. Many people have back pain at some point in their lives. It can feel different to each person. It may feel like a muscle ache or a sharp, stabbing pain. The pain often gets worse over time. It can be difficult to find the cause of chronic back pain. Treating chronic back pain often starts with rest and pain relief, followed by exercises (physical therapy) to strengthen the muscles that support your back. You may have to try different things to see what works best for you. If other treatments do not help, or if your pain is caused by a condition or an injury, you may need surgery. How can back pain affect me? Chronic back pain is uncomfortable and can make it hard to do your usual daily activities. Chronic back pain can:  Cause numbness and tingling.  Come and go.  Get worse when you are sitting, standing, walking, bending, or lifting.  Affect you while you are active, at rest, or both.  Eventually make it hard to move around.  Occur with  fever, weight loss, or difficulty urinating.  What are the benefits of treating back pain? Treating chronic back pain may:  Relieve pain.  Keep your pain from getting worse.  Make it easier for you to do your usual activities.  What are some steps I can take to decrease my back pain?  Take over-the-counter or prescription medicines only as told by your health care provider.  If directed, apply heat to the affected area. Use the heat source that your health care provider recommends, such as a moist heat pack or a heating pad. ? Place a towel between your skin and the heat source. ? Leave the heat on for 20-30 minutes. ? Remove the heat if your skin turns bright red. This is especially important if you are unable to feel pain, heat, or cold. You may have a greater risk of getting burned.  If directed, put ice on the affected area: ? Put ice in a plastic bag. ? Place a towel between your skin and the bag. ? Leave the ice on for 20 minutes, 2-3 times a day.  Get regular exercise as told by your health care provider to improve flexibility and strength.  Do not smoke.  Maintain a healthy weight.  When lifting objects: ? Keep your feet as far apart as your shoulders (shoulder-width apart) or farther apart. ? Tighten the muscles in your abdomen. ? Bend your knees and hips and keep your spine neutral. It  is important to lift using the strength of your legs, not your back. Do not lock your knees straight out. ? Always ask for help to lift heavy or awkward objects. What can happen if my back pain goes untreated? Untreated back pain can:  Get worse over time.  Start to occur more often or at different times, such as when you are resting.  Cause posture problems.  Make it hard to move around (limit mobility).  Where can I get support? Chronic back pain can be a frustrating condition to manage. It may help to talk with other people who are having a similar experience. Consider  joining a support group for people dealing with chronic back pain. Ask your health care provider about support groups in your area. You can also find online and in-person support groups through:  The American Chronic Pain Association: RetailCleaners.fihttps://theacpa.org/Support-Groups  The U.S. Pain Foundation: uspainfoundation.org/support-groups  Contact a health care provider if:  Your symptoms do not get better or they get worse.  You have severe back pain.  You have chronic back pain and a fever.  You lose weight without trying.  You have difficulty urinating.  You experience numbness or tingling.  You develop new pain after an injury. Summary  Chronic back pain is often treated with rest, pain relief, and physical therapy.  Get regular exercise to improve your strength and flexibility.  Put heat and ice on the affected areas as directed by your health care provider.  Chronic back pain can be challenging to live with. Joining a support group may help you manage your condition. This information is not intended to replace advice given to you by your health care provider. Make sure you discuss any questions you have with your health care provider. Document Released: 06/01/2015 Document Revised: 01/24/2016 Document Reviewed: 01/24/2016 Elsevier Interactive Patient Education  2018 [employer]  Chronic Back Pain When back pain lasts longer than 3 months, it is called chronic back pain.The cause of your back pain may not be known. Some common causes include:  Wear and tear (degenerative disease) of the bones, ligaments, or disks in your back.  Inflammation and stiffness in your back (arthritis).  People who have chronic back pain often go through certain periods in which the pain is more intense (flare-ups). Many people can learn to manage the pain with home care. Follow these instructions at home: Pay attention to any changes in your symptoms. Take these actions to help with your  pain: Activity  Avoid bending and activities that make the problem worse.  Do not sit or stand in one place for long periods of time.  Take brief periods of rest throughout the day. This will reduce your pain. Resting in a lying or standing position is usually better than sitting to rest.  When you are resting for longer periods, mix in some mild activity or stretching between periods of rest. This will help to prevent stiffness and pain.  Get regular exercise. Ask your health care provider what activities are safe for you.  Do not lift anything that is heavier than 10 lb (4.5 kg). Always use proper lifting technique, which includes: ? Bending your knees. ? Keeping the load close to your body. ? Avoiding twisting. Managing pain  If directed, apply ice to the painful area. Your health care provider may recommend applying ice during the first 24-48 hours after a flare-up begins. ? Put ice in a plastic bag. ? Place a towel between your skin and  the bag. ? Leave the ice on for 20 minutes, 2-3 times per day.  After icing, apply heat to the affected area as often as told by your health care provider. Use the heat source that your health care provider recommends, such as a moist heat pack or a heating pad. ? Place a towel between your skin and the heat source. ? Leave the heat on for 20-30 minutes. ? Remove the heat if your skin turns bright red. This is especially important if you are unable to feel pain, heat, or cold. You may have a greater risk of getting burned.  Try soaking in a warm tub.  Take over-the-counter and prescription medicines only as told by your health care provider.  Keep all follow-up visits as told by your health care provider. This is important. Contact a health care provider if:  You have pain that is not relieved with rest or medicine. Get help right away if:  You have weakness or numbness in one or both of your legs or feet.  You have trouble controlling  your bladder or your bowels.  You have nausea or vomiting.  You have pain in your abdomen.  You have shortness of breath or you faint. This information is not intended to replace advice given to you by your health care provider. Make sure you discuss any questions you have with your health care provider. Document Released: 06/24/2004 Document Revised: 09/25/2015 Document Reviewed: 11/04/2014 Elsevier Interactive Patient Education  2018 [employer].

## 2017-11-15 ENCOUNTER — Encounter: Payer: Self-pay | Admitting: Cardiology

## 2017-11-15 ENCOUNTER — Other Ambulatory Visit: Payer: Self-pay | Admitting: Cardiology

## 2017-11-15 DIAGNOSIS — I208 Other forms of angina pectoris: Secondary | ICD-10-CM

## 2017-11-15 NOTE — Progress Notes (Signed)
Diana [name], [name]  Date of visit:  11/15/2017 DOB:  Jan 14, 1956    Age:  62 yrs. Medical record number:  [id number]     Account number:  [id number] Primary Care Provider: Clelia CroftSHAW, EVA PROESCHOLDT ____________________________ CURRENT DIAGNOSES  1. Chest pain  2. Abnormal electrocardiogram [ECG]  3. Essential hypertension  4. Gastro-esophageal reflux disease ____________________________ ALLERGIES  Codeine, Intolerance-unknown  Penicillins, Rash ____________________________ MEDICATIONS  1. cyclobenzaprine 10 mg tablet, PRN  2. ergocalciferol (vitamin D2) 50,000 unit capsule, weekly  3. fluticasone propionate 50 mcg/actuation nasal spray,suspension, 2 Sprays each nostril q.d.  4. gabapentin 400 mg capsule, QID  5. nitroglycerin 0.4 mg sublingual tablet, Place 1 tablet every 5 minutes PRN under the tongue for chest pain  6. prednisone 20 mg tablet, Take as directed ____________________________ CHIEF COMPLAINTS  cardiac followup ____________________________ HISTORY OF PRESENT ILLNESS This nice 62 year old female is seen at the request of Dr. Clelia CroftShaw for evaluation of chest discomfort and cardiac followup. The patient has a previous history of hypertension and has been on medicines in the past but is not currently taking antihypertensive medicines. About one year ago she had symptomatology where she had exertional diaphoresis some cough upper respiratory congestion and some tightness and or chest. Evidently a troponin was drawn and was abnormal and the patient was called back and in the mid to the hospital. She evidently had a fairly flat troponin pattern and head testing delayed and she was in the hospital but ultimately had a myocardial perfusion scan showed a fixed apical defect but no ischemia. EF was read as 44%. Your echocardiogram was read as showing an inferior wall motion abnormality with an EF of 50-55%. [facility] at that time and was supposed to have followup in 2-3 weeks and  was discharged on nitroglycerin but no other medicines. She never had cardiology followup. She has continued to use intermittent nitroglycerin since that time. She describes it as some sweating some shortness of breath precipitated by stress and when somebody becomes upset with your. 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 She denies PND, orthopnea, syncope, or claudication. Most recently she has been having significant neck and shoulder pain and there is a suggestion about whether she has some cervical disc disease. She's also had pain involving or left foot and has been receiving attention for that and is currently out of. More recently she was asked to see cardiology again for followup in because she continues to use nitroglycerin. She is currently not taking aspirin and is not taking hyper lipidemia medications for blood pressure medicines. She denies PND, orthopnea or claudication. ____________________________ PAST HISTORY  Past Medical Illnesses:  GERD, anemia, hypertension, osteoarthritis;  Cardiovascular Illnesses:  S/P MI, 2018;  Infectious Diseases:  no previous history of significant infectious diseases;  Surgical Procedures:  tonsillectomy, tubal ligation, left hand surgery;  Trauma History:  no previous history of significant trauma;  NYHA Classification:  I;  Canadian Angina Classification:  Class 0: Asymptomatic;  Cardiology Procedures-Invasive:  no previous interventional or invasive cardiology procedures;  Cardiology Procedures-Noninvasive:  echocardiogram June 2018, lexiscan Myoview June 2018;  Peripheral Vascular Procedures:  lower extremity arterial doppler March 2019;  LVEF of 50% documented via echocardiogram on 11/01/2016,   ____________________________ CARDIO-PULMONARY TEST DATES EKG Date:  11/15/2017;  Nuclear Study Date:  02/01/2017;  Echocardiography Date: 11/01/2016;    ____________________________ FAMILY HISTORY Brother -- Alive and  well Brother -- Brother dead, Liver disease Brother -- Alive and well Father -- Father dead, Medical history unknown Mother -- Mother alive with problem, Hypertension, Hypothyroidism Sister -- Sister dead, HIV positive Sister -- Armed forces training and education officer and well ____________________________ SOCIAL HISTORY Alcohol Use:  does not use alcohol;  Smoking:  never smoked;  Diet:  regular diet;  Lifestyle:  single;  Exercise:  exercises regularly;  Occupation:  custodian;  Residence:  lives with mother;   ____________________________ REVIEW OF SYSTEMS General:  denies recent weight change, fatique or change in exercise tolerance.  Integumentary:no rashes or new skin lesions. Eyes: denies diplopia, history of glaucoma or visual problems. Ears, Nose, Throat, Mouth:  denies any hearing loss, epistaxis, hoarseness or difficulty speaking. Respiratory: denies dyspnea, cough, wheezing or hemoptysis. Cardiovascular:  please review HPI Abdominal: denies dyspepsia, GI bleeding, constipation, or diarrheaGenitourinary-Female: no dysuria, urgency, frequency, UTIs, or stress incontinence Musculoskeletal:  denies arthritis, venous insufficiency, or muscle weakness Neurological:  denies headaches, stroke, or TIA Psychiatric:  situational stress Hematological/Immunologic:  denies any food allergies, bleeding disorders. ____________________________ PHYSICAL EXAMINATION VITAL SIGNS  Blood Pressure:  110/80 Sitting, Left arm, regular cuff  , 120/80 Standing, Left arm and regular cuff   Pulse:  102/min. Weight:  130.00 lbs. Height:  62"BMI: 24  Constitutional:  pleasant African American female in no acute distress Skin:  warm and dry to touch, no apparent skin lesions, or masses noted. Head:  normocephalic, normal hair pattern, no masses or tenderness Eyes:  EOMS Intact, PERRLA, C and S clear, Funduscopic exam not done. ENT:  ears, nose and throat reveal no gross  abnormalities.  Dentition good. Neck:  supple, without massess. No JVD, thyromegaly or carotid bruits. Carotid upstroke normal. Chest:  normal symmetry, clear to auscultation. Cardiac:  regular rhythm, normal S1 and S2, No S3 or S4, no murmurs, gallops or rubs detected. Abdomen:  abdomen soft,non-tender, no masses, no hepatospenomegaly, or aneurysm noted Peripheral Pulses:  the femoral,dorsalis pedis, and posterior tibial pulses are full and equal bilaterally with no bruits auscultated. Extremities & Back:  no deformities, clubbing, cyanosis, erythema or edema observed. Normal muscle strength and tone. Neurological:  no gross motor or sensory deficits noted, affect appropriate, oriented x3. ____________________________ MOST RECENT LIPID PANEL 10/31/16  CHOL TOTL 176 mg/dl, LDL 161 NM, HDL 59 mg/dl, TRIGLYCER 49 mg/dl and CHOL/HDL 3.0 (Calc) ____________________________ IMPRESSIONS/PLAN  1. Admission for chest pain with flat troponins about one year ago no firm diagnosis of CAD was made at that admission. She has continued to have intermittent nitroglycerin use for atypical symptoms 2. Prior history of hypertension 3. Abnormal EKG with inferolateral T-wave inversions 4. Previous history of hyperlipidemia lipids currently normal  Recommendations:  She has done fairly heavy work over the past year but has not any work related symptomatology in terms of her heart. She continues to use nitroglycerin. She has an abnormal EKG. At this point I think it would be very important particularly in light of her current ongoing symptoms and nitroglycerin use to clarify the diagnosis of CAD. I've recommended that she have a cardiac CTA and we will make arrangements to get this done. She is agreeable to having this done. If she has a CAD diagnosis she will need to take aspirin and lipid-lowering. Thank you for asking me to see her with you and I will see your in followup following the  CTA.  ____________________________ TODAYS ORDERS  1. 12 Lead EKG: Today  2. Cardiac CTA: First Available  ____________________________ Cardiology Physician:  Kerry Hough MD [facility]

## 2017-11-16 ENCOUNTER — Ambulatory Visit: Payer: BLUE CROSS/BLUE SHIELD | Admitting: Podiatry

## 2017-11-16 ENCOUNTER — Encounter: Payer: Self-pay | Admitting: Podiatry

## 2017-11-16 ENCOUNTER — Ambulatory Visit (INDEPENDENT_AMBULATORY_CARE_PROVIDER_SITE_OTHER): Payer: BLUE CROSS/BLUE SHIELD

## 2017-11-16 DIAGNOSIS — M7752 Other enthesopathy of left foot: Secondary | ICD-10-CM

## 2017-11-16 DIAGNOSIS — M779 Enthesopathy, unspecified: Secondary | ICD-10-CM

## 2017-11-16 DIAGNOSIS — M7751 Other enthesopathy of right foot: Secondary | ICD-10-CM

## 2017-11-16 MED ORDER — MELOXICAM 15 MG PO TABS: 15.0000 mg | ORAL_TABLET | Freq: Every day | ORAL | 1 refills | Status: AC

## 2017-11-21 ENCOUNTER — Ambulatory Visit (INDEPENDENT_AMBULATORY_CARE_PROVIDER_SITE_OTHER): Payer: BLUE CROSS/BLUE SHIELD | Admitting: Orthotics

## 2017-11-21 DIAGNOSIS — M189 Osteoarthritis of first carpometacarpal joint, unspecified: Secondary | ICD-10-CM

## 2017-11-21 DIAGNOSIS — M7752 Other enthesopathy of left foot: Secondary | ICD-10-CM

## 2017-11-21 DIAGNOSIS — M7751 Other enthesopathy of right foot: Secondary | ICD-10-CM

## 2017-11-21 NOTE — Progress Notes (Signed)
Patient came into today to be cast for Custom Foot Orthotics. Upon recommendation of Dr. Logan BoresEvans Patient presents with 2nd MPJ cap LEFT and pain upon palpation 1st MPJ Right  Goals are to offload painful MPJ Plan vendor [place]

## 2017-11-23 NOTE — Progress Notes (Signed)
Sign visit close encounter  HPI: 62 year old female presenting today, referred by Dr. Elijah Birkom, with a chief complaint of pain in the toes o45f the left foot that began several months ago. She reports the toes are rubbing together which increases the pain. She has not done anything for treatment. Patient is here for further evaluation and treatment.   Past Medical History:  Diagnosis Date  . Allergy   . Arthritis    feet  . Bronchitis 10/30/2016  . Carpal tunnel syndrome   . Non-ST elevation (NSTEMI) myocardial infarction (HCC) 10/30/2016  . NSTEMI (non-ST elevated myocardial infarction) (HCC) 10/30/2016     Physical Exam: General: The patient is alert and oriented x3 in no acute distress.  Dermatology: Skin is warm, dry and supple bilateral lower extremities. Negative for open lesions or macerations.  Vascular: Palpable pedal pulses bilaterally. No edema or erythema noted. Capillary refill within normal limits.  Neurological: Epicritic and protective threshold grossly intact bilaterally.   Musculoskeletal Exam: Pain with palpation to the 1st MPJ of the left foot and 2nd MPJ of the right foot. Range of motion within normal limits to all pedal and ankle joints bilateral. Muscle strength 5/5 in all groups bilateral.   Radiographic Exam:  Normal osseous mineralization. Joint spaces preserved. No fracture/dislocation/boney destruction.    Assessment: 1. 2nd MPJ capsulitis right 2. 1st MPJ capsulitis left    Plan of Care:  1. Patient evaluated. X-Rays reviewed.  2. Appointment with Raiford Nobleick for custom molded orthotics.  3. Patient works 2 full time jobs on her feet all day. I explained that orthotics should help support her feet while working.  4. Prescription for Meloxicam provided to patient.  5. Return to clinic in 3 months.       Felecia ShellingBrent M. Jericha Bryden, DPM [facility]  Dr. Felecia ShellingBrent M. Virna Livengood, DPM    [address]                                        [place], [place] [id number]                 Office [phone]  Fax [phone]

## 2017-11-25 LAB — POC HEMOCCULT BLD/STL (HOME/3-CARD/SCREEN)
Card #2 Fecal Occult Blod, POC: NEGATIVE
FECAL OCCULT BLD: NEGATIVE
FECAL OCCULT BLD: NEGATIVE

## 2017-11-28 ENCOUNTER — Telehealth: Payer: Self-pay | Admitting: Family Medicine

## 2017-11-28 ENCOUNTER — Telehealth: Payer: Self-pay | Admitting: Podiatry

## 2017-11-28 NOTE — Telephone Encounter (Signed)
Copied from CRM [id number]. Topic: Inquiry >> Nov 28, 2017  2:38 PM Leafy Roobinson, Norma J wrote: Reason for CRM:pt is calling her  employer has not received FMLA paperwork. Please fax to [phone]. Pt said she give fmla paperwork to dr Clelia Croftshaw on 6-17

## 2017-11-28 NOTE — Telephone Encounter (Signed)
The Medication you Prescribed for me makes my head hurt really bad. So I stopped taking it. Patient using [facility] on [address]

## 2017-11-28 NOTE — Telephone Encounter (Signed)
Do you have this paperwork? I have not seen anything for this patient in my box. Please let me know and please make sure I get a copy

## 2017-11-29 ENCOUNTER — Ambulatory Visit: Payer: BLUE CROSS/BLUE SHIELD | Admitting: Family Medicine

## 2017-12-02 NOTE — Telephone Encounter (Signed)
Forms placed in FMLA box at [employer] front office Sat 7/6. No charge to pt since largely done during her OV.

## 2017-12-05 NOTE — Telephone Encounter (Signed)
Paperwork scanned and faxed on 12/05/17 °

## 2017-12-09 ENCOUNTER — Telehealth: Payer: Self-pay | Admitting: Hematology and Oncology

## 2017-12-09 ENCOUNTER — Encounter: Payer: Self-pay | Admitting: *Deleted

## 2017-12-09 ENCOUNTER — Inpatient Hospital Stay: Payer: BLUE CROSS/BLUE SHIELD | Attending: Hematology and Oncology

## 2017-12-09 ENCOUNTER — Inpatient Hospital Stay: Payer: BLUE CROSS/BLUE SHIELD | Admitting: Hematology and Oncology

## 2017-12-09 DIAGNOSIS — Z79899 Other long term (current) drug therapy: Secondary | ICD-10-CM | POA: Diagnosis not present

## 2017-12-09 DIAGNOSIS — D509 Iron deficiency anemia, unspecified: Secondary | ICD-10-CM | POA: Diagnosis not present

## 2017-12-09 DIAGNOSIS — D709 Neutropenia, unspecified: Secondary | ICD-10-CM

## 2017-12-09 LAB — CBC WITH DIFFERENTIAL (CANCER CENTER ONLY)
BASOS ABS: 0 10*3/uL (ref 0.0–0.1)
Basophils Relative: 0 %
EOS PCT: 4 %
Eosinophils Absolute: 0.1 10*3/uL (ref 0.0–0.5)
HEMATOCRIT: 34.7 % — AB (ref 34.8–46.6)
Hemoglobin: 10.9 g/dL — ABNORMAL LOW (ref 11.6–15.9)
Lymphocytes Relative: 46 %
Lymphs Abs: 1.3 10*3/uL (ref 0.9–3.3)
MCH: 25.4 pg (ref 25.1–34.0)
MCHC: 31.4 g/dL — AB (ref 31.5–36.0)
MCV: 80.9 fL (ref 79.5–101.0)
MONO ABS: 0.5 10*3/uL (ref 0.1–0.9)
MONOS PCT: 17 %
Neutro Abs: 1 10*3/uL — ABNORMAL LOW (ref 1.5–6.5)
Neutrophils Relative %: 33 %
PLATELETS: 156 10*3/uL (ref 145–400)
RBC: 4.29 MIL/uL (ref 3.70–5.45)
RDW: 14.8 % — AB (ref 11.2–14.5)
WBC Count: 2.9 10*3/uL — ABNORMAL LOW (ref 3.9–10.3)

## 2017-12-09 LAB — IRON AND TIBC
IRON: 110 ug/dL (ref 41–142)
Saturation Ratios: 47 % (ref 21–57)
TIBC: 235 ug/dL — AB (ref 236–444)
UIBC: 125 ug/dL

## 2017-12-09 LAB — FERRITIN: FERRITIN: 764 ng/mL — AB (ref 11–307)

## 2017-12-09 NOTE — Telephone Encounter (Signed)
Gave patient avs and calendar of upcoming oct appts.  °

## 2017-12-09 NOTE — Assessment & Plan Note (Signed)
Previous workup: WBC 3.2, ANC 1.3 ANA negative B12 371 Folic acid 18  Today's ANC is

## 2017-12-09 NOTE — Assessment & Plan Note (Signed)
Hemoglobin 11.1 with MCV of 78 and Hemoglobin electrophoresis: Normal adult hemoglobin no evidence of beta thalassemia Based on the normal hemoglobin electrophoresis, I suspect iron deficiency to be the primary etiology behind the microcytic anemia.   IV iron given on 09/09/2017 2 doses Lab review: 12/09/2017:

## 2017-12-09 NOTE — Progress Notes (Signed)
Patient Care Team: Shawnee Knapp, MD as PCP - General (Family Medicine) Daryll Brod, MD as Consulting Physician (Orthopedic Surgery)  DIAGNOSIS:  Encounter Diagnoses  Name Primary?  . Iron deficiency anemia, unspecified iron deficiency anemia type   . Neutropenia, unspecified type ([place])    CHIEF COMPLIANT: Follow-up after receiving IV iron therapy, neutropenia as well  INTERVAL HISTORY: Diana [name] is a 31-year with above-mentioned history of neutropenia and iron deficiency anemia.  She received 2 dose of IV iron therapy and did not notice any difference in her physical strength are performance status.  She feels perfectly fine.  Her hemoglobin improved from 10.5-10.9.  Her ANC is still at 1000.  She does not have any frequent infections.  She is here today accompanied by her mother.  REVIEW OF SYSTEMS:   Constitutional: Denies fevers, chills or abnormal weight loss Eyes: Denies blurriness of vision Ears, nose, mouth, throat, and face: Denies mucositis or sore throat Respiratory: Denies cough, dyspnea or wheezes Cardiovascular: Denies palpitation, chest discomfort Gastrointestinal:  Denies nausea, heartburn or change in bowel habits Skin: Denies abnormal skin rashes Lymphatics: Denies new lymphadenopathy or easy bruising Neurological:Denies numbness, tingling or new weaknesses Behavioral/Psych: Mood is stable, no new changes  Extremities: No lower extremity edema All other systems were reviewed with the patient and are negative.  I have reviewed the past medical history, past surgical history, social history and family history with the patient and they are unchanged from previous note.  ALLERGIES:  is allergic to penicillins and codeine.  MEDICATIONS:  Current Outpatient Medications  Medication Sig Dispense Refill  . cyclobenzaprine (FLEXERIL) 10 MG tablet Take 0.5-1 tablets (5-10 mg total) by mouth 3 (three) times daily as needed for muscle spasms. 30 tablet 0  . fluticasone  (FLONASE) 50 MCG/ACT nasal spray Place 2 sprays into both nostrils daily. 16 g 2  . gabapentin (NEURONTIN) 400 MG capsule Take 1 capsule (400 mg total) by mouth 4 (four) times daily. 120 capsule 2  . meloxicam (MOBIC) 15 MG tablet Take 1 tablet (15 mg total) by mouth daily. 60 tablet 1  . nitroGLYCERIN (NITROSTAT) 0.4 MG SL tablet Place 1 tablet (0.4 mg total) every 5 (five) minutes as needed under the tongue for chest pain. 30 tablet 2  . predniSONE (DELTASONE) 20 MG tablet Take 3 PO QAM x3days, 2 PO QAM x3days, 1 PO QAM x3days (Patient not taking: Reported on 11/14/2017) 18 tablet 0  . Vitamin D, Ergocalciferol, (DRISDOL) 50000 units CAPS capsule Take 1 capsule (50,000 Units total) every 7 (seven) days by mouth. 12 capsule 1   No current facility-administered medications for this visit.     PHYSICAL EXAMINATION: ECOG PERFORMANCE STATUS: 0 - Asymptomatic  Vitals:   12/09/17 0822  BP: 138/89  Pulse: 86  Resp: 18  Temp: 97.7 F (36.5 C)  SpO2: 100%   Filed Weights   12/09/17 0822  Weight: 132 lb 12.8 oz (60.2 kg)    GENERAL:alert, no distress and comfortable SKIN: skin color, texture, turgor are normal, no rashes or significant lesions EYES: normal, Conjunctiva are pink and non-injected, sclera clear OROPHARYNX:no exudate, no erythema and lips, buccal mucosa, and tongue normal  NECK: supple, thyroid normal size, non-tender, without nodularity LYMPH:  no palpable lymphadenopathy in the cervical, axillary or inguinal LUNGS: clear to auscultation and percussion with normal breathing effort HEART: regular rate & rhythm and no murmurs and no lower extremity edema ABDOMEN:abdomen soft, non-tender and normal bowel sounds MUSCULOSKELETAL:no cyanosis of digits  and no clubbing  NEURO: alert & oriented x 3 with fluent speech, no focal motor/sensory deficits EXTREMITIES: No lower extremity edema   LABORATORY DATA:  I have reviewed the data as listed CMP Latest Ref Rng & Units 07/25/2017  01/15/2017 10/31/2016  Glucose 65 - 99 mg/dL 94 88 109(H)  BUN 6 - 24 mg/dL '20 17 10  ' Creatinine 0.57 - 1.00 mg/dL 0.52(L) 0.55(L) 0.47  Sodium 134 - 144 mmol/L 143 140 138  Potassium 3.5 - 5.2 mmol/L 4.3 4.6 3.7  Chloride 96 - 106 mmol/L 101 102 105  CO2 20 - 29 mmol/L '25 25 25  ' Calcium 8.7 - 10.2 mg/dL 9.4 9.9 8.7(L)  Total Protein 6.0 - 8.5 g/dL 7.4 7.8 -  Total Bilirubin 0.0 - 1.2 mg/dL 0.3 0.4 -  Alkaline Phos 39 - 117 IU/L 77 85 -  AST 0 - 40 IU/L 20 33 -  ALT 0 - 32 IU/L 11 32 -    Lab Results  Component Value Date   WBC 2.9 (L) 12/09/2017   HGB 10.9 (L) 12/09/2017   HCT 34.7 (L) 12/09/2017   MCV 80.9 12/09/2017   PLT 156 12/09/2017   NEUTROABS 1.0 (L) 12/09/2017    ASSESSMENT & PLAN:  Iron deficiency anemia Hemoglobin 11.1 with MCV of 78 and Hemoglobin electrophoresis: Normal adult hemoglobin no evidence of beta thalassemia Based on the normal hemoglobin electrophoresis, I suspect iron deficiency to be the primary etiology behind the microcytic anemia.   IV iron given on 09/09/2017 2 doses Lab review: 12/09/2017: Hemoglobin 10.9 improved from 10.5   Neutropenia (HCC) Previous workup: WBC 3.2, ANC 1.3 ANA negative R71 165 Folic acid 18  Today's ANC is 1 I recommended that we do a bone marrow biopsy to evaluate this further. However patient did not want to do a bone marrow biopsy at this time.  She wants to wait 3 months and then do a bone marrow then.  We will set her up for 52-monthfollow-up with bone marrow biopsy.  No orders of the defined types were placed in this encounter.  The patient has a good understanding of the overall plan. she agrees with it. she will call with any problems that may develop before the next visit here.   VHarriette Ohara MD 12/09/17

## 2017-12-12 ENCOUNTER — Encounter: Payer: Self-pay | Admitting: Orthotics

## 2017-12-12 ENCOUNTER — Other Ambulatory Visit: Payer: Self-pay

## 2017-12-12 ENCOUNTER — Ambulatory Visit: Payer: BLUE CROSS/BLUE SHIELD | Admitting: Orthotics

## 2017-12-12 DIAGNOSIS — M546 Pain in thoracic spine: Secondary | ICD-10-CM

## 2017-12-12 DIAGNOSIS — G8929 Other chronic pain: Secondary | ICD-10-CM

## 2017-12-12 DIAGNOSIS — M7752 Other enthesopathy of left foot: Secondary | ICD-10-CM

## 2017-12-12 DIAGNOSIS — M7751 Other enthesopathy of right foot: Secondary | ICD-10-CM

## 2017-12-12 MED ORDER — DICLOFENAC SODIUM 75 MG PO TBEC: 75.0000 mg | DELAYED_RELEASE_TABLET | Freq: Two times a day (BID) | ORAL | 0 refills | Status: DC

## 2017-12-12 NOTE — Progress Notes (Signed)
Need to send back.  Accomodations on wrong foot b/l.

## 2017-12-14 ENCOUNTER — Encounter: Payer: Self-pay | Admitting: *Deleted

## 2017-12-14 ENCOUNTER — Ambulatory Visit (HOSPITAL_COMMUNITY)
Admission: RE | Admit: 2017-12-14 | Discharge: 2017-12-14 | Disposition: A | Discharge status: Home / Self Care | Payer: BLUE CROSS/BLUE SHIELD | Source: Ambulatory Visit | Attending: Cardiology | Admitting: Cardiology

## 2017-12-14 DIAGNOSIS — I208 Other forms of angina pectoris: Secondary | ICD-10-CM

## 2017-12-14 DIAGNOSIS — I25118 Atherosclerotic heart disease of native coronary artery with other forms of angina pectoris: Secondary | ICD-10-CM | POA: Insufficient documentation

## 2017-12-14 DIAGNOSIS — R079 Chest pain, unspecified: Secondary | ICD-10-CM | POA: Diagnosis not present

## 2017-12-14 MED ORDER — NITROGLYCERIN 0.4 MG SL SUBL
SUBLINGUAL_TABLET | SUBLINGUAL | Status: AC
  Filled 2017-12-14: qty 2

## 2017-12-14 MED ORDER — METOPROLOL TARTRATE 5 MG/5ML IV SOLN
5.0000 mg | INTRAVENOUS | Status: AC | PRN
  Administered 2017-12-14 (×4): 5 mg via INTRAVENOUS
  Filled 2017-12-14 (×2): qty 5

## 2017-12-14 MED ORDER — METOPROLOL TARTRATE 5 MG/5ML IV SOLN
INTRAVENOUS | Status: AC
  Filled 2017-12-14: qty 20

## 2017-12-14 MED ORDER — IOPAMIDOL (ISOVUE-370) INJECTION 76%
INTRAVENOUS | Status: AC
  Administered 2017-12-14: 09:00:00
  Filled 2017-12-14: qty 100

## 2017-12-14 MED ORDER — NITROGLYCERIN 0.4 MG SL SUBL
0.8000 mg | SUBLINGUAL_TABLET | Freq: Once | SUBLINGUAL | Status: AC
  Administered 2017-12-14: 0.8 mg via SUBLINGUAL
  Filled 2017-12-14: qty 25

## 2017-12-15 ENCOUNTER — Ambulatory Visit: Payer: BLUE CROSS/BLUE SHIELD | Admitting: Family Medicine

## 2017-12-15 ENCOUNTER — Encounter: Payer: Self-pay | Admitting: *Deleted

## 2017-12-15 VITALS — BP 126/75 | HR 80 | Temp 98.0°F | Resp 16 | Ht 62.0 in | Wt 131.0 lb

## 2017-12-15 DIAGNOSIS — R519 Headache, unspecified: Secondary | ICD-10-CM

## 2017-12-15 DIAGNOSIS — R110 Nausea: Secondary | ICD-10-CM | POA: Diagnosis not present

## 2017-12-15 DIAGNOSIS — R1310 Dysphagia, unspecified: Secondary | ICD-10-CM | POA: Diagnosis not present

## 2017-12-15 DIAGNOSIS — R51 Headache: Secondary | ICD-10-CM

## 2017-12-15 LAB — POCT CBC
GRANULOCYTE PERCENT: 48.9 % (ref 37–80)
HCT, POC: 36.2 % — AB (ref 37.7–47.9)
Hemoglobin: 11.1 g/dL — AB (ref 12.2–16.2)
LYMPH, POC: 1.5 (ref 0.6–3.4)
MCH, POC: 24.6 pg — AB (ref 27–31.2)
MCHC: 30.6 g/dL — AB (ref 31.8–35.4)
MCV: 80.5 fL (ref 80–97)
MID (CBC): 0.2 (ref 0–0.9)
MPV: 7.9 fL (ref 0–99.8)
POC GRANULOCYTE: 1.7 — AB (ref 2–6.9)
POC LYMPH %: 44.6 % (ref 10–50)
POC MID %: 6.5 %M (ref 0–12)
Platelet Count, POC: 220 10*3/uL (ref 142–424)
RBC: 4.5 M/uL (ref 4.04–5.48)
RDW, POC: 14.4 %
WBC: 3.4 10*3/uL — AB (ref 4.6–10.2)

## 2017-12-15 LAB — POC MICROSCOPIC URINALYSIS (UMFC)

## 2017-12-15 LAB — POCT URINALYSIS DIP (MANUAL ENTRY)
BILIRUBIN UA: NEGATIVE mg/dL
Bilirubin, UA: NEGATIVE
GLUCOSE UA: NEGATIVE mg/dL
LEUKOCYTES UA: NEGATIVE
Nitrite, UA: NEGATIVE
Protein Ur, POC: 30 mg/dL — AB
SPEC GRAV UA: 1.025 (ref 1.010–1.025)
Urobilinogen, UA: 0.2 E.U./dL
pH, UA: 6.5 (ref 5.0–8.0)

## 2017-12-15 LAB — POCT SEDIMENTATION RATE: POCT SED RATE: 36 mm/hr — AB (ref 0–22)

## 2017-12-15 MED ORDER — ONDANSETRON 8 MG PO TBDP: 4.0000 mg | ORAL_TABLET | Freq: Three times a day (TID) | ORAL | 0 refills | Status: DC | PRN

## 2017-12-15 MED ORDER — OMEPRAZOLE 20 MG PO CPDR: 20.0000 mg | DELAYED_RELEASE_CAPSULE | Freq: Every day | ORAL | 1 refills | Status: DC

## 2017-12-15 MED ORDER — BUTALBITAL-APAP-CAFFEINE 50-325-40 MG PO TABS: 1.0000 | ORAL_TABLET | Freq: Four times a day (QID) | ORAL | 0 refills | Status: DC | PRN

## 2017-12-15 NOTE — Progress Notes (Signed)
Subjective:    Patient ID: Diana [name], female    DOB: 10/14/1955, 62 y.o.   MRN: [id number] Chief Complaint  Patient presents with  . Headache    pt had a ct scan 12/14/17 and believe that the die used in the scan made here sick.  . Nausea    x 1 day    HPI  Yesterday she was nervous for the CT scan for her heart. She had to be given a lot of IV med to slow down her HR to 60 yesterday.  Then she was given 2 SL nitro right before Then she had drink coke to bring the HR back up but she was having a hard time swallowing.  Then she asked if allergic to the dye - told might be having an allergic reaction  The chiropractor noticed a difference  [facility] was going to work with her for a massage but she currently has had to many copays and deductibles for all of her   She is vomiting up the food she has eaten.  No antiemetics but is able to tolerate fluids and keep them down. No abd pain. Taking prilosec so she is not otc 20mg  instead of the nitroglycerin. Wondering if it might be a sinus headache  Past Medical History:  Diagnosis Date  . Allergy   . Arthritis    feet  . Bronchitis 10/30/2016  . Carpal tunnel syndrome   . Non-ST elevation (NSTEMI) myocardial infarction (HCC) 10/30/2016  . NSTEMI (non-ST elevated myocardial infarction) (HCC) 10/30/2016   Past Surgical History:  Procedure Laterality Date  . CARPAL TUNNEL RELEASE Left 02/12/2016   Procedure: LEFT CARPAL TUNNEL RELEASE;  Surgeon: Cindee SaltGary Kuzma, MD;  Location: [facility];  Service: Orthopedics;  Laterality: Left;  FAB  . TONSILLECTOMY    . TUBAL LIGATION    . WRIST SURGERY Right    Current Outpatient Medications on File Prior to Visit  Medication Sig Dispense Refill  . cyclobenzaprine (FLEXERIL) 10 MG tablet Take 0.5-1 tablets (5-10 mg total) by mouth 3 (three) times daily as needed for muscle spasms. 30 tablet 0  . fluticasone (FLONASE) 50 MCG/ACT nasal spray Place 2 sprays into both nostrils daily. 16 g 2    . nitroGLYCERIN (NITROSTAT) 0.4 MG SL tablet Place 1 tablet (0.4 mg total) every 5 (five) minutes as needed under the tongue for chest pain. 30 tablet 2  . Vitamin D, Ergocalciferol, (DRISDOL) 50000 units CAPS capsule Take 1 capsule (50,000 Units total) every 7 (seven) days by mouth. 12 capsule 1  . diclofenac (VOLTAREN) 75 MG EC tablet Take 1 tablet (75 mg total) by mouth 2 (two) times daily. (Patient not taking: Reported on 12/15/2017) 30 tablet 0  . gabapentin (NEURONTIN) 400 MG capsule Take 1 capsule (400 mg total) by mouth 4 (four) times daily. (Patient not taking: Reported on 12/15/2017) 120 capsule 2  . meloxicam (MOBIC) 15 MG tablet Take 1 tablet (15 mg total) by mouth daily. (Patient not taking: Reported on 12/15/2017) 60 tablet 1  . predniSONE (DELTASONE) 20 MG tablet Take 3 PO QAM x3days, 2 PO QAM x3days, 1 PO QAM x3days (Patient not taking: Reported on 11/14/2017) 18 tablet 0   No current facility-administered medications on file prior to visit.    Allergies  Allergen Reactions  . Penicillins Rash    Has patient had a PCN reaction causing immediate rash, facial/tongue/throat swelling, SOB or lightheadedness with hypotension: yes Has patient had a PCN reaction  causing severe rash involving mucus membranes or skin necrosis:  no Has patient had a PCN reaction that required hospitalization:  no Has patient had a PCN reaction occurring within the last 10 years: no If all of the above answers are "NO", then may proceed with Cephalosporin use.   . Codeine Anxiety   Family History  Problem Relation Age of Onset  . Thyroid disease Mother   . Brain cancer Sister    Social History   Socioeconomic History  . Marital status: Single    Spouse name: Alcario Drought  . Number of children: 2  . Years of education: 12th grade  . Highest education level: Not on file  Occupational History  . Occupation: Warden/ranger: [employer]  Social Needs  . Financial resource strain: Not on file   . Food insecurity:    Worry: Not on file    Inability: Not on file  . Transportation needs:    Medical: Not on file    Non-medical: Not on file  Tobacco Use  . Smoking status: Never Smoker  . Smokeless tobacco: Never Used  Substance and Sexual Activity  . Alcohol use: No  . Drug use: No  . Sexual activity: Not Currently  Lifestyle  . Physical activity:    Days per week: Not on file    Minutes per session: Not on file  . Stress: Not on file  Relationships  . Social connections:    Talks on phone: Not on file    Gets together: Not on file    Attends religious service: Not on file    Active member of club or organization: Not on file    Attends meetings of clubs or organizations: Not on file    Relationship status: Not on file  Other Topics Concern  . Not on file  Social History Narrative   Divorced from her husband. She has two adult sons from that union . One lives in [place], the other lives in [place] (as a Solicitor) following Office manager.   Married Alcario Drought 05/2014.   Depression screen [facility] 2/9 12/15/2017 11/14/2017 11/04/2017 04/04/2017 03/18/2017  Decreased Interest 0 0 0 0 0  Down, Depressed, Hopeless 0 0 0 0 0  PHQ - 2 Score 0 0 0 0 0      Review of Systems     Objective:   Physical Exam    BP 126/75   Pulse 80   Temp 98 F (36.7 C) (Oral)   Resp 16   Ht 5\' 2"  (1.575 m)   Wt 131 lb (59.4 kg)   SpO2 96%   BMI 23.96 kg/m      Assessment & Plan:   1. Nausea without vomiting   2. Dysphagia, unspecified type   3. Acute nonintractable headache, unspecified headache type     Orders Placed This Encounter  Procedures  . Comprehensive metabolic panel  . C-reactive protein  . POCT CBC  . POCT urinalysis dipstick  . POCT Microscopic Urinalysis (UMFC)  . POCT SEDIMENTATION RATE    Meds ordered this encounter  Medications  . omeprazole (PRILOSEC) 20 MG capsule    Sig: Take 1 capsule (20 mg total) by mouth daily.    Dispense:  90 capsule    Refill:  1  .  ondansetron (ZOFRAN-ODT) 8 MG disintegrating tablet    Sig: Take 0.5-1 tablets (4-8 mg total) by mouth every 8 (eight) hours as needed for nausea.    Dispense:  30  tablet    Refill:  0  . butalbital-acetaminophen-caffeine (FIORICET, ESGIC) 50-325-40 MG tablet    Sig: Take 1-2 tablets by mouth every 6 (six) hours as needed for headache.    Dispense:  20 tablet    Refill:  0    I personally performed the services described in this documentation, which was scribed in my presence. The recorded information has been reviewed and considered, and addended by me as needed.   Norberto Sorenson, M.D.  Primary Care at [facility] [address] [place], [place] [address] [phone] phone [phone] fax  01/03/18 1:38 PM

## 2017-12-15 NOTE — Patient Instructions (Addendum)
IF you received an x-ray today, you will receive an invoice from [facility]. Please contact [facility] at [phone] with questions or concerns regarding your invoice.   IF you received labwork today, you will receive an invoice from [facility]. Please contact [facility] at [phone] with questions or concerns regarding your invoice.   Our billing staff will not be able to assist you with questions regarding bills from these companies.  You will be contacted with the lab results as soon as they are available. The fastest way to get your results is to activate your My Chart account. Instructions are located on the last page of this paperwork. If you have not heard from [place] regarding the results in 2 weeks, please contact this office.      Rehydration, Adult Rehydration is the replacement of body fluids and salts and minerals (electrolytes) that are lost during dehydration. Dehydration is when there is not enough fluid or water in the body. This happens when you lose more fluids than you take in. Common causes of dehydration include:  Vomiting.  Diarrhea.  Excessive sweating, such as from heat exposure or exercise.  Taking medicines that cause the body to lose excess fluid (diuretics).  Impaired kidney function.  Not drinking enough fluid.  Certain illnesses or infections.  Certain poorly controlled long-term (chronic) illnesses, such as diabetes, heart disease, and kidney disease.  Symptoms of mild dehydration may include thirst, dry lips and mouth, dry skin, and dizziness. Symptoms of severe dehydration may include increased heart rate, confusion, fainting, and not urinating. You can rehydrate by drinking certain fluids or getting fluids through an IV tube, as told by your health care provider. What are the risks? Generally, rehydration is safe. However, one problem that can happen is taking in too much fluid (overhydration). This is rare. If overhydration  happens, it can cause an electrolyte imbalance, kidney failure, or a decrease in salt (sodium) levels in the body. How to rehydrate Follow instructions from your health care provider for rehydration. The kind of fluid you should drink and the amount you should drink depend on your condition.  If directed by your health care provider, drink an oral rehydration solution (ORS). This is a drink designed to treat dehydration that is found in pharmacies and retail stores. ? Make an ORS by following instructions on the package. ? Start by drinking small amounts, about  cup (120 mL) every 5-10 minutes. ? Slowly increase how much you drink until you have taken the amount recommended by your health care provider.  Drink enough clear fluids to keep your urine clear or pale yellow. If you were instructed to drink an ORS, finish the ORS first, then start slowly drinking other clear fluids. Drink fluids such as: ? Water. Do not drink only water. Doing that can lead to having too little sodium in your body (hyponatremia). ? Ice chips. ? Fruit juice that you have added water to (diluted juice). ? Low-calorie sports drinks.  If you are severely dehydrated, your health care provider may recommend that you receive fluids through an IV tube in the hospital.  Do not take sodium tablets. Doing that can lead to the condition of having too much sodium in your body (hypernatremia).  Eating while you rehydrate Follow instructions from your health care provider about what to eat while you rehydrate. Your health care provider may recommend that you slowly begin eating regular foods in small amounts.  Eat foods that contain a healthy balance  of electrolytes, such as bananas, oranges, potatoes, tomatoes, and spinach.  Avoid foods that are greasy or contain a lot of fat or sugar.  In some cases, you may get nutrition through a feeding tube that is passed through your nose and into your stomach (nasogastric tube, or NG  tube). This may be done if you have uncontrolled vomiting or diarrhea. Beverages to avoid Certain beverages may make dehydration worse. While you rehydrate, avoid:  Alcohol.  Caffeine.  Drinks that contain a lot of sugar. These include: ? High-calorie sports drinks. ? Fruit juice that is not diluted. ? Soda.  Check nutrition labels to see how much sugar or caffeine a beverage contains. Signs of dehydration recovery You may be recovering from dehydration if:  You are urinating more often than before you started rehydrating.  Your urine is clear or pale yellow.  Your energy level improves.  You vomit less frequently.  You have diarrhea less frequently.  Your appetite improves or returns to normal.  You feel less dizzy or less light-headed.  Your skin tone and color start to look more normal.  Contact a health care provider if:  You continue to have symptoms of mild dehydration, such as: ? Thirst. ? Dry lips. ? Slightly dry mouth. ? Dry, warm skin. ? Dizziness.  You continue to vomit or have diarrhea. Get help right away if:  You have symptoms of dehydration that get worse.  You feel: ? Confused. ? Weak. ? Like you are going to faint.  You have not urinated in 6-8 hours.  You have very dark urine.  You have trouble breathing.  Your heart rate while sitting still is over 100 beats a minute.  You cannot drink fluids without vomiting.  You have vomiting or diarrhea that: ? Gets worse. ? Does not go away.  You have a fever. This information is not intended to replace advice given to you by your health care provider. Make sure you discuss any questions you have with your health care provider. Document Released: 08/09/2011 Document Revised: 12/05/2015 Document Reviewed: 07/11/2015 Elsevier Interactive Patient Education  2018 [employer].  Dehydration, Adult Dehydration is a condition in which there is not enough fluid or water in the body. This happens  when you lose more fluids than you take in. Important organs, such as the kidneys, brain, and heart, cannot function without a proper amount of fluids. Any loss of fluids from the body can lead to dehydration. Dehydration can range from mild to severe. This condition should be treated right away to prevent it from becoming severe. What are the causes? This condition may be caused by:  Vomiting.  Diarrhea.  Excessive sweating, such as from heat exposure or exercise.  Not drinking enough fluid, especially: ? When ill. ? While doing activity that requires a lot of energy.  Excessive urination.  Fever.  Infection.  Certain medicines, such as medicines that cause the body to lose excess fluid (diuretics).  Inability to access safe drinking water.  Reduced physical ability to get adequate water and food.  What increases the risk? This condition is more likely to develop in people:  Who have a poorly controlled long-term (chronic) illness, such as diabetes, heart disease, or kidney disease.  Who are age 69 or older.  Who are disabled.  Who live in a place with high altitude.  Who play endurance sports.  What are the signs or symptoms? Symptoms of mild dehydration may include:  Thirst.  Dry lips.  Slightly dry mouth.  Dry, warm skin.  Dizziness. Symptoms of moderate dehydration may include:  Very dry mouth.  Muscle cramps.  Dark urine. Urine may be the color of tea.  Decreased urine production.  Decreased tear production.  Heartbeat that is irregular or faster than normal (palpitations).  Headache.  Light-headedness, especially when you stand up from a sitting position.  Fainting (syncope). Symptoms of severe dehydration may include:  Changes in skin, such as: ? Cold and clammy skin. ? Blotchy (mottled) or pale skin. ? Skin that does not quickly return to normal after being lightly pinched and released (poor skin turgor).  Changes in body fluids,  such as: ? Extreme thirst. ? No tear production. ? Inability to sweat when body temperature is high, such as in hot weather. ? Very little urine production.  Changes in vital signs, such as: ? Weak pulse. ? Pulse that is more than 100 beats a minute when sitting still. ? Rapid breathing. ? Low blood pressure.  Other changes, such as: ? Sunken eyes. ? Cold hands and feet. ? Confusion. ? Lack of energy (lethargy). ? Difficulty waking up from sleep. ? Short-term weight loss. ? Unconsciousness. How is this diagnosed? This condition is diagnosed based on your symptoms and a physical exam. Blood and urine tests may be done to help confirm the diagnosis. How is this treated? Treatment for this condition depends on the severity. Mild or moderate dehydration can often be treated at home. Treatment should be started right away. Do not wait until dehydration becomes severe. Severe dehydration is an emergency and it needs to be treated in a hospital. Treatment for mild dehydration may include:  Drinking more fluids.  Replacing salts and minerals in your blood (electrolytes) that you may have lost. Treatment for moderate dehydration may include:  Drinking an oral rehydration solution (ORS). This is a drink that helps you replace fluids and electrolytes (rehydrate). It can be found at pharmacies and retail stores. Treatment for severe dehydration may include:  Receiving fluids through an IV tube.  Receiving an electrolyte solution through a feeding tube that is passed through your nose and into your stomach (nasogastric tube, or NG tube).  Correcting any abnormalities in electrolytes.  Treating the underlying cause of dehydration. Follow these instructions at home:  If directed by your health care provider, drink an ORS: ? Make an ORS by following instructions on the package. ? Start by drinking small amounts, about  cup (120 mL) every 5-10 minutes. ? Slowly increase how much you  drink until you have taken the amount recommended by your health care provider.  Drink enough clear fluid to keep your urine clear or pale yellow. If you were told to drink an ORS, finish the ORS first, then start slowly drinking other clear fluids. Drink fluids such as: ? Water. Do not drink only water. Doing that can lead to having too little salt (sodium) in the body (hyponatremia). ? Ice chips. ? Fruit juice that you have added water to (diluted fruit juice). ? Low-calorie sports drinks.  Avoid: ? Alcohol. ? Drinks that contain a lot of sugar. These include high-calorie sports drinks, fruit juice that is not diluted, and soda. ? Caffeine. ? Foods that are greasy or contain a lot of fat or sugar.  Take over-the-counter and prescription medicines only as told by your health care provider.  Do not take sodium tablets. This can lead to having too much sodium in the body (hypernatremia).  Eat foods that  contain a healthy balance of electrolytes, such as bananas, oranges, potatoes, tomatoes, and spinach.  Keep all follow-up visits as told by your health care provider. This is important. Contact a health care provider if:  You have abdominal pain that: ? Gets worse. ? Stays in one area (localizes).  You have a rash.  You have a stiff neck.  You are more irritable than usual.  You are sleepier or more difficult to wake up than usual.  You feel weak or dizzy.  You feel very thirsty.  You have urinated only a small amount of very dark urine over 6-8 hours. Get help right away if:  You have symptoms of severe dehydration.  You cannot drink fluids without vomiting.  Your symptoms get worse with treatment.  You have a fever.  You have a severe headache.  You have vomiting or diarrhea that: ? Gets worse. ? Does not go away.  You have blood or green matter (bile) in your vomit.  You have blood in your stool. This may cause stool to look black and tarry.  You have not  urinated in 6-8 hours.  You faint.  Your heart rate while sitting still is over 100 beats a minute.  You have trouble breathing. This information is not intended to replace advice given to you by your health care provider. Make sure you discuss any questions you have with your health care provider. Document Released: 05/17/2005 Document Revised: 12/12/2015 Document Reviewed: 07/11/2015 Elsevier Interactive Patient Education  [employer]2018 Elsevier Inc.

## 2017-12-16 LAB — C-REACTIVE PROTEIN

## 2017-12-16 LAB — COMPREHENSIVE METABOLIC PANEL
ALK PHOS: 83 IU/L (ref 39–117)
ALT: 28 IU/L (ref 0–32)
AST: 29 IU/L (ref 0–40)
Albumin/Globulin Ratio: 1.5 (ref 1.2–2.2)
Albumin: 4.4 g/dL (ref 3.5–5.5)
BUN/Creatinine Ratio: 30 — ABNORMAL HIGH (ref 9–23)
BUN: 16 mg/dL (ref 6–24)
Bilirubin Total: 0.2 mg/dL (ref 0.0–1.2)
CHLORIDE: 105 mmol/L (ref 96–106)
CO2: 21 mmol/L (ref 20–29)
Calcium: 9.7 mg/dL (ref 8.7–10.2)
Creatinine, Ser: 0.54 mg/dL — ABNORMAL LOW (ref 0.57–1.00)
GFR calc Af Amer: 119 mL/min/{1.73_m2} (ref >59)
GFR calc non Af Amer: 104 mL/min/{1.73_m2} (ref >59)
GLUCOSE: 89 mg/dL (ref 65–99)
Globulin, Total: 3 g/dL (ref 1.5–4.5)
POTASSIUM: 4.6 mmol/L (ref 3.5–5.2)
Sodium: 144 mmol/L (ref 134–144)
Total Protein: 7.4 g/dL (ref 6.0–8.5)

## 2017-12-21 ENCOUNTER — Telehealth: Payer: Self-pay | Admitting: Family Medicine

## 2017-12-21 NOTE — Telephone Encounter (Signed)
Not sure if this is for our office or not--was Dr Clelia CroftShaw going to go over these results with her?

## 2017-12-21 NOTE — Telephone Encounter (Signed)
Copied from CRM [id number]. Topic: Quick Communication - Other Results >> Dec 21, 2017 11:40 AM Stephannie LiSimmons, Amirr Achord L, NT wrote: Reason for CRM: Patient would like results from Dr Donnie Ahoilley and it os ok to leave a message with the results ,please call [phone]

## 2017-12-25 ENCOUNTER — Other Ambulatory Visit: Payer: Self-pay | Admitting: Family Medicine

## 2017-12-26 ENCOUNTER — Telehealth: Payer: Self-pay | Admitting: Family Medicine

## 2017-12-26 NOTE — Telephone Encounter (Signed)
Pt called and notified that according to notes of lab results in 07/2017 once current prescription of Vit D was completed the pt was to start taking between 2000-4000iu of Vit D over the counter. Pt verbalized understanding.   Refill of Nitroglycerin sent to requested pharmacy.

## 2017-12-26 NOTE — Telephone Encounter (Signed)
Copied from CRM [id number]. Topic: Quick Communication - Rx Refill/Question >> Dec 26, 2017  6:47 AM Diana [name], Diana [name] wrote: Medication: nitroGLYCERIN (NITROSTAT) 0.4 MG SL tablet    Vitamin D, Ergocalciferol, (DRISDOL) 50000 units CAPS capsule  Has the patient contacted their pharmacy? Yes.   (Agent: If no, request that the patient contact the pharmacy for the refill.) (Agent: If yes, when and what did the pharmacy advise?) to call provider she had no more refills on this medicine  Preferred Pharmacy (with phone number or street name): [facility] [phone] - [place], [place] - [address] [facility] [address] [phone] (Phone) [phone] (Fax)      Agent: Please be advised that RX refills may take up to 3 business days. We ask that you follow-up with your pharmacy.

## 2017-12-27 ENCOUNTER — Encounter: Payer: Self-pay | Admitting: Cardiology

## 2017-12-27 ENCOUNTER — Encounter: Payer: Self-pay | Admitting: Podiatry

## 2017-12-27 ENCOUNTER — Ambulatory Visit: Payer: BLUE CROSS/BLUE SHIELD | Admitting: Orthotics

## 2017-12-27 DIAGNOSIS — G8929 Other chronic pain: Secondary | ICD-10-CM

## 2017-12-27 DIAGNOSIS — M7751 Other enthesopathy of right foot: Secondary | ICD-10-CM

## 2017-12-27 DIAGNOSIS — M546 Pain in thoracic spine: Secondary | ICD-10-CM

## 2017-12-27 NOTE — Progress Notes (Signed)
Patient came in today to pick up custom made foot orthotics.  The goals were accomplished and the patient reported no dissatisfaction with said orthotics.  Patient was advised of breakin period and how to report any issues. 

## 2017-12-27 NOTE — Progress Notes (Signed)
Diana [name]  Date of visit:  12/27/2017 DOB:  [birth year]    Age:  62 yrs. Medical record number:  [id number]     Account number:  [id number] Primary Care Provider: Clelia Croft, EVA PROESCHOLDT ____________________________ CURRENT DIAGNOSES  1. CAD native with angina  2. Chest pain  3. Abnormal electrocardiogram [ECG]  4. Gastro-esophageal reflux disease  5. Essential hypertension  6. Hyperlipidemia ____________________________ ALLERGIES  Codeine, Intolerance-unknown  Penicillins, Rash ____________________________ MEDICATIONS  1. aspirin 81 mg chewable tablet, 1 p.o. daily  2. atorvastatin 40 mg tablet, 1 p.o. daily  3. gabapentin 400 mg capsule, QID  4. nitroglycerin 0.4 mg sublingual tablet, Place 1 tablet every 5 minutes PRN under the tongue for chest pain  5. omeprazole 20 mg capsule,delayed release, 1 p.o. daily ____________________________ CHIEF COMPLAINTS  discuss cath ____________________________ HISTORY OF PRESENT ILLNESS This nice 62 year old female is seen to discuss the results of her recent CTA and catheterization. The patient has a previous history of hypertension and has been on medicines in the past but is not currently taking antihypertensive medicines. About one year ago she had symptomatology where she had exertional diaphoresis some cough upper respiratory congestion and some tightness in herchest. Evidently a troponin was drawn and was abnormal and the patient was called back and admitted to the hospital. She evidently had a fairly flat troponin pattern and had testing delayed and she was in the hospitalfor several days but ultimately had a myocardial perfusion scan showed a fixed apical defect but no ischemia. EF was read as 44%. Her echocardiogram was read as showing an inferior wall motion abnormality with an EF of 50-55%. She was seen by [facility] heart care at that time and was supposed to have followup in 2-3 weeks and was discharged on nitroglycerin but no other medicines.  She never had cardiology followup. She has continued to use intermittent nitroglycerin since that time. She describes it as some sweating some shortness of breath precipitated by stress and when somebody becomes upset with her. 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 She denies PND, orthopnea, syncope, or claudication. Most recently she has been having significant neck and shoulder pain and there is a suggestion about whether she has some cervical disc disease. She's also had pain involving her left foot and has been receiving attention for that and is currently out of. I Saw her recently and because of the symptoms and previous abnormal troponins and inconclusive noninvasive testing recommended a CTA. This was done on July 17 and showed what was thought to be either a severe stenosis or total occlusion of the LAD and circumflex with a abnormal FFR and a moderate stenosis involving the ramus. RCA evidently was nonischemic and had mild disease. The day after work she was quite nervous and there is a question about whether she had a reaction to contrast all the the notes from the procedure just not reports an immediate reaction to contrast although the patient feels as if she had one. She has continued to use nitroglycerin since then for relief of substernal chest discomfort and symptoms. She and her mother were in today and it was recommended that she undergo cardiac catheterization. ____________________________ PAST HISTORY  Past Medical Illnesses:  GERD, anemia, hypertension, osteoarthritis;  Cardiovascular Illnesses:  S/P MI, 2018;  Infectious Diseases:  no previous history of significant infectious diseases;  Surgical Procedures:  tonsillectomy, tubal ligation, carpal tunnel  repair left;  Trauma History:  no previous history of significant trauma;  NYHA Classification:  I;  Canadian Angina Classification:  Class  0: Asymptomatic;  Cardiology Procedures-Invasive:  no previous interventional or invasive cardiology procedures;  Cardiology Procedures-Noninvasive:  echocardiogram June 2018, lexiscan Myoview June 2018, CTA coronaries 12/14/17;  Peripheral Vascular Procedures:  lower extremity arterial doppler March 2019;  LVEF of 50% documented via echocardiogram on 11/01/2016,   ____________________________ CARDIO-PULMONARY TEST DATES EKG Date:  11/15/2017;  Nuclear Study Date:  02/01/2017;  Echocardiography Date: 11/01/2016;   ____________________________ FAMILY HISTORY Brother -- Alive and well Brother -- Brother dead, Liver disease Brother -- Armed forces training and education officerAlive and well, No current problems or disability Father -- Father dead, Medical history unknown Mother -- Mother alive with problem, Hypertension, Hypothyroidism Sister -- Sister dead, HIV positive Sister -- Armed forces training and education officerAlive and well ____________________________ SOCIAL HISTORY Alcohol Use:  does not use alcohol;  Smoking:  never smoked;  Diet:  regular diet;  Lifestyle:  single;  Exercise:  exercises regularly;  Occupation:  custodian;  Residence:  lives with mother;   ____________________________ REVIEW OF SYSTEMS General:  denies recent weight change, fatique or change in exercise tolerance.  Integumentary:no rashes or new skin lesions. Eyes: denies diplopia, history of glaucoma or visual problems. Ears, Nose, Throat, Mouth:  denies any hearing loss, epistaxis, hoarseness or difficulty speaking. Respiratory: denies dyspnea, cough, wheezing or hemoptysis. Cardiovascular:  please review HPI Abdominal: denies dyspepsia, GI bleeding, constipation, or diarrheaGenitourinary-Female: no dysuria, urgency, frequency, UTIs, or stress incontinence Musculoskeletal:  foot issues Neurological:  denies headaches, stroke, or TIA Psychiatric:  situational stress Hematological/Immunologic:  iron deficiency anemia ____________________________ PHYSICAL EXAMINATION VITAL SIGNS  Blood Pressure:   130/80 Sitting, Right arm, regular cuff  , 130/82 Standing, Right arm and regular cuff   Pulse:  88/min. Weight:  134.00 lbs. Height:  62.00"BMI: 24  Constitutional:  pleasant African American female in no acute distress Skin:  warm and dry to touch, no apparent skin lesions, or masses noted. Head:  normocephalic, normal hair pattern, no masses or tenderness Eyes:  EOMS Intact, PERRLA, C and S clear, Funduscopic exam not done. ENT:  ears, nose and throat reveal no gross abnormalities.  Dentition good. Neck:  supple, without massess. No JVD, thyromegaly or carotid bruits. Carotid upstroke normal. Chest:  normal symmetry, clear to auscultation. Cardiac:  regular rhythm, normal S1 and S2, No S3 or S4, no murmurs, gallops or rubs detected. Abdomen:  abdomen soft,non-tender, no masses, no hepatospenomegaly, or aneurysm noted Peripheral Pulses:  the femoral,dorsalis pedis, and posterior tibial pulses are full and equal bilaterally with no bruits auscultated. Extremities & Back:  no deformities, clubbing, cyanosis, erythema or edema observed. Normal muscle strength and tone. Neurological:  no gross motor or sensory deficits noted, affect appropriate, oriented x3. ____________________________ MOST RECENT LIPID PANEL 10/31/16  CHOL TOTL 176 mg/dl, LDL 045107 NM, HDL 59 mg/dl, TRIGLYCER 49 mg/dl and CHOL/HDL 3.0 (Calc) ____________________________ IMPRESSIONS/PLAN  1. Abnormal chest discomfort and other symptoms with inconclusive noninvasive testing but with significant disease on recent CTA with total occlusions of 2 arteries and a significant stenosis in a ramus branch 2. CAD with very high calcium score  3. Prior history of hypertension 4. Iron deficiency anemia 5. History of carpal tunnel syndrome 6. Foot pain  Recommendations:  Long discussion with patient and mother concerning results of CTA. She has a very high calcium score of 2004. On reviewing history had minimal risk factors. She does  not know much about her father's side  of the family and lipid testing previously showed minimal hyperlipidemia. I have recommended cardiac catheterization area and we told her that further recommendations would come following review of the catheterization. She may have to have a staged procedure because it appears that she already has 2 vessels that are occluded.Cardiac catheterization was discussed with the patient including risks of myocardial infarction, death, stroke, bleeding, arrhythmia, dye allergy, or renal insufficiency. She understands and is willing to proceed. Possibility of percutaneous intervention at the same setting was also discussed with the patient including risks.  She feels as if she has a previous dye allergy although records do not substantiate this. I will premedicate her with steroids the evening been prior to the procedure and can give Benadryl also although no documented immediate contrast reaction was noted at the time of the previous cath.  She was started on atorvastatin 40 mg as well as aspirin 81 mg daily. ____________________________ TODAYS ORDERS  1. Comprehensive Metabolic Panel: Today  2. Complete Blood Count: Today                       ____________________________ Cardiology Physician:  Darden Palmer MD [facility]

## 2017-12-28 ENCOUNTER — Ambulatory Visit: Payer: BLUE CROSS/BLUE SHIELD | Admitting: Family Medicine

## 2017-12-28 DIAGNOSIS — I2510 Atherosclerotic heart disease of native coronary artery without angina pectoris: Secondary | ICD-10-CM | POA: Diagnosis present

## 2017-12-28 NOTE — H&P (Signed)
 Diana [name], Diana [name]  Date of visit:  12/27/2017 DOB:  12/27/1955    Age:  61 yrs. Medical record number:  [id number]     Account number:  [id number] Primary Care Provider: SHAW, EVA PROESCHOLDT ____________________________ CURRENT DIAGNOSES  1. CAD native with angina  2. Chest pain  3. Abnormal electrocardiogram [ECG]  4. Gastro-esophageal reflux disease  5. Essential hypertension  6. Hyperlipidemia ____________________________ ALLERGIES  Codeine, Intolerance-unknown  Penicillins, Rash ____________________________ MEDICATIONS  1. aspirin 81 mg chewable tablet, 1 p.o. daily  2. atorvastatin 40 mg tablet, 1 p.o. daily  3. gabapentin 400 mg capsule, QID  4. nitroglycerin 0.4 mg sublingual tablet, Place 1 tablet every 5 minutes PRN under the tongue for chest pain  5. omeprazole 20 mg capsule,delayed release, 1 p.o. daily ____________________________ CHIEF COMPLAINTS  discuss cath ____________________________ HISTORY OF PRESENT ILLNESS This nice 61-year-old female is seen to discuss the results of her recent CTA and catheterization. The patient has a previous history of hypertension and has been on medicines in the past but is not currently taking antihypertensive medicines. About one year ago she had symptomatology where she had exertional diaphoresis some cough upper respiratory congestion and some tightness in herchest. Evidently a troponin was drawn and was abnormal and the patient was called back and admitted to the hospital. She evidently had a fairly flat troponin pattern and had testing delayed and she was in the hospitalfor several days but ultimately had a myocardial perfusion scan showed a fixed apical defect but no ischemia. EF was read as 44%. Her echocardiogram was read as showing an inferior wall motion abnormality with an EF of 50-55%. She was seen by [facility] heart care at that time and was supposed to have followup in 2-3 weeks and was discharged on nitroglycerin but no other medicines.  She never had cardiology followup. She has continued to use intermittent nitroglycerin since that time. She describes it as some sweating some shortness of breath precipitated by stress and when somebody becomes upset with her. 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 She denies PND, orthopnea, syncope, or claudication. Most recently she has been having significant neck and shoulder pain and there is a suggestion about whether she has some cervical disc disease. She's also had pain involving her left foot and has been receiving attention for that and is currently out of. I Saw her recently and because of the symptoms and previous abnormal troponins and inconclusive noninvasive testing recommended a CTA. This was done on July 17 and showed what was thought to be either a severe stenosis or total occlusion of the LAD and circumflex with a abnormal FFR and a moderate stenosis involving the ramus. RCA evidently was nonischemic and had mild disease. The day after work she was quite nervous and there is a question about whether she had a reaction to contrast all the the notes from the procedure just not reports an immediate reaction to contrast although the patient feels as if she had one. She has continued to use nitroglycerin since then for relief of substernal chest discomfort and symptoms. She and her mother were in today and it was recommended that she undergo cardiac catheterization. ____________________________ PAST HISTORY  Past Medical Illnesses:  GERD, anemia, hypertension, osteoarthritis;  Cardiovascular Illnesses:  S/P MI, 2018;  Infectious Diseases:  no previous history of significant infectious diseases;  Surgical Procedures:  tonsillectomy, tubal ligation, carpal tunnel   repair left;  Trauma History:  no previous history of significant trauma;  NYHA Classification:  I;  Canadian Angina Classification:  Class  0: Asymptomatic;  Cardiology Procedures-Invasive:  no previous interventional or invasive cardiology procedures;  Cardiology Procedures-Noninvasive:  echocardiogram June 2018, lexiscan Myoview June 2018, CTA coronaries 12/14/17;  Peripheral Vascular Procedures:  lower extremity arterial doppler March 2019;  LVEF of 50% documented via echocardiogram on 11/01/2016,   ____________________________ CARDIO-PULMONARY TEST DATES EKG Date:  11/15/2017;  Nuclear Study Date:  02/01/2017;  Echocardiography Date: 11/01/2016;   ____________________________ FAMILY HISTORY Brother -- Alive and well Brother -- Brother dead, Liver disease Brother -- Alive and well, No current problems or disability Father -- Father dead, Medical history unknown Mother -- Mother alive with problem, Hypertension, Hypothyroidism Sister -- Sister dead, HIV positive Sister -- Alive and well ____________________________ SOCIAL HISTORY Alcohol Use:  does not use alcohol;  Smoking:  never smoked;  Diet:  regular diet;  Lifestyle:  single;  Exercise:  exercises regularly;  Occupation:  custodian;  Residence:  lives with mother;   ____________________________ REVIEW OF SYSTEMS General:  denies recent weight change, fatique or change in exercise tolerance.  Integumentary:no rashes or new skin lesions. Eyes: denies diplopia, history of glaucoma or visual problems. Ears, Nose, Throat, Mouth:  denies any hearing loss, epistaxis, hoarseness or difficulty speaking. Respiratory: denies dyspnea, cough, wheezing or hemoptysis. Cardiovascular:  please review HPI Abdominal: denies dyspepsia, GI bleeding, constipation, or diarrheaGenitourinary-Female: no dysuria, urgency, frequency, UTIs, or stress incontinence Musculoskeletal:  foot issues Neurological:  denies headaches, stroke, or TIA Psychiatric:  situational stress Hematological/Immunologic:  iron deficiency anemia ____________________________ PHYSICAL EXAMINATION VITAL SIGNS  Blood Pressure:   130/80 Sitting, Right arm, regular cuff  , 130/82 Standing, Right arm and regular cuff   Pulse:  88/min. Weight:  134.00 lbs. Height:  62.00"BMI: 24  Constitutional:  pleasant African American female in no acute distress Skin:  warm and dry to touch, no apparent skin lesions, or masses noted. Head:  normocephalic, normal hair pattern, no masses or tenderness Eyes:  EOMS Intact, PERRLA, C and S clear, Funduscopic exam not done. ENT:  ears, nose and throat reveal no gross abnormalities.  Dentition good. Neck:  supple, without massess. No JVD, thyromegaly or carotid bruits. Carotid upstroke normal. Chest:  normal symmetry, clear to auscultation. Cardiac:  regular rhythm, normal S1 and S2, No S3 or S4, no murmurs, gallops or rubs detected. Abdomen:  abdomen soft,non-tender, no masses, no hepatospenomegaly, or aneurysm noted Peripheral Pulses:  the femoral,dorsalis pedis, and posterior tibial pulses are full and equal bilaterally with no bruits auscultated. Extremities & Back:  no deformities, clubbing, cyanosis, erythema or edema observed. Normal muscle strength and tone. Neurological:  no gross motor or sensory deficits noted, affect appropriate, oriented x3. ____________________________ MOST RECENT LIPID PANEL 10/31/16  CHOL TOTL 176 mg/dl, LDL 107 NM, HDL 59 mg/dl, TRIGLYCER 49 mg/dl and CHOL/HDL 3.0 (Calc) ____________________________ IMPRESSIONS/PLAN  1. Abnormal chest discomfort and other symptoms with inconclusive noninvasive testing but with significant disease on recent CTA with total occlusions of 2 arteries and a significant stenosis in a ramus branch 2. CAD with very high calcium score  3. Prior history of hypertension 4. Iron deficiency anemia 5. History of carpal tunnel syndrome 6. Foot pain  Recommendations:  Long discussion with patient and mother concerning results of CTA. She has a very high calcium score of 2004. On reviewing history had minimal risk factors. She does  not know much about her father's side   of the family and lipid testing previously showed minimal hyperlipidemia. I have recommended cardiac catheterization area and we told her that further recommendations would come following review of the catheterization. She may have to have a staged procedure because it appears that she already has 2 vessels that are occluded.Cardiac catheterization was discussed with the patient including risks of myocardial infarction, death, stroke, bleeding, arrhythmia, dye allergy, or renal insufficiency. She understands and is willing to proceed. Possibility of percutaneous intervention at the same setting was also discussed with the patient including risks.  She feels as if she has a previous dye allergy although records do not substantiate this. I will premedicate her with steroids the evening been prior to the procedure and can give Benadryl also although no documented immediate contrast reaction was noted at the time of the previous cath.  She was started on atorvastatin 40 mg as well as aspirin 81 mg daily. ____________________________ TODAYS ORDERS  1. Comprehensive Metabolic Panel: Today  2. Complete Blood Count: Today                       ____________________________ Cardiology Physician:  W. Spencer Tilley, Jr. MD [facility]    

## 2017-12-29 ENCOUNTER — Telehealth: Payer: Self-pay | Admitting: Family Medicine

## 2017-12-29 NOTE — Telephone Encounter (Signed)
Pt has an appt 12-30-17

## 2017-12-29 NOTE — Telephone Encounter (Signed)
[employer] . Pt dropped by with more FMLA paperwork. Pt is needing Dr.Shaw to remove restirctions. Please have ppr work faxed by next Wednesday 01/04/2018. To [phone] Leandro ReasonerJackie Wells. Pt can be reached at [phone]

## 2017-12-30 ENCOUNTER — Telehealth: Payer: Self-pay

## 2017-12-30 ENCOUNTER — Ambulatory Visit: Payer: BLUE CROSS/BLUE SHIELD | Admitting: Family Medicine

## 2017-12-30 NOTE — Telephone Encounter (Signed)
Informed patient that job description questionnaire was completed and faxed to her HR department.  Voiced understanding.  No further needs at this time.

## 2018-01-02 ENCOUNTER — Encounter: Payer: Self-pay | Admitting: Family Medicine

## 2018-01-02 ENCOUNTER — Ambulatory Visit: Payer: BLUE CROSS/BLUE SHIELD | Admitting: Family Medicine

## 2018-01-02 ENCOUNTER — Other Ambulatory Visit: Payer: Self-pay

## 2018-01-02 VITALS — BP 128/72 | HR 89 | Temp 99.0°F | Resp 17 | Ht 62.5 in | Wt 133.2 lb

## 2018-01-02 DIAGNOSIS — F418 Other specified anxiety disorders: Secondary | ICD-10-CM | POA: Diagnosis not present

## 2018-01-02 DIAGNOSIS — M542 Cervicalgia: Secondary | ICD-10-CM | POA: Diagnosis not present

## 2018-01-02 DIAGNOSIS — I200 Unstable angina: Secondary | ICD-10-CM | POA: Diagnosis not present

## 2018-01-02 NOTE — Progress Notes (Addendum)
Subjective:    Patient ID: Diana [name], female    DOB: 04/22/1956, 62 y.o.   MRN: [id number] Chief Complaint  Patient presents with  . fmla paperwork, note to take out of work due to [employer]  . Medication Refill    flexeril    HPI Neck and shoulders are having a lot of tension.  Alternating heat and ice w/o success,  Salon pas w/o success.  Using the cyclobenzaprine along WITH the gabapentin.  The gabapentin 400 was to strong so we switched her to gabapentin 300 which she has taken at night x 2 weks bfore she ran out so never titrated dose up.  Only made her a little sleepy.   Past Medical History:  Diagnosis Date  . Allergy   . Arthritis    feet  . Bronchitis 10/30/2016  . Carpal tunnel syndrome   . Coronary artery disease   . Non-ST elevation (NSTEMI) myocardial infarction (HCC) 10/30/2016  . NSTEMI (non-ST elevated myocardial infarction) (HCC) 10/30/2016   Past Surgical History:  Procedure Laterality Date  . CARPAL TUNNEL RELEASE Left 02/12/2016   Procedure: LEFT CARPAL TUNNEL RELEASE;  Surgeon: Cindee SaltGary Kuzma, MD;  Location: [facility];  Service: Orthopedics;  Laterality: Left;  FAB  . CORONARY ARTERY BYPASS GRAFT N/A 01/10/2018   Procedure: CORONARY ARTERY BYPASS GRAFTING (CABG) TIMES 4, USING LEFT INTERNAL MAMMARY ARTERY TO OM1  AND ENDOSCOPICALLY HARVESTED RIGHT SAPHENOUS VEIN TO DIAGONAL, AND SEQUENTIALLY TO OM2 AND DISTAL CIRCUMFLEX;  Surgeon: Delight OvensGerhardt, Edward B, MD;  Location: [facility] OR;  Service: Open Heart Surgery;  Laterality: N/A;  . LEFT HEART CATH AND CORONARY ANGIOGRAPHY N/A 01/05/2018   Procedure: LEFT HEART CATH AND CORONARY ANGIOGRAPHY;  Surgeon: Lennette BihariKelly, Thomas A, MD;  Location: [facility];  Service: Cardiovascular;  Laterality: N/A;  . TEE WITHOUT CARDIOVERSION N/A 01/10/2018   Procedure: TRANSESOPHAGEAL ECHOCARDIOGRAM (TEE);  Surgeon: Delight OvensGerhardt, Edward B, MD;  [facility];  Service: Open Heart Surgery;  Laterality: N/A;  . TONSILLECTOMY    .  TUBAL LIGATION    . WRIST SURGERY Right    Current Facility-Administered Medications on File Prior to Visit  Medication Dose Route Frequency Provider Last Rate Last Dose  . 0.9 %  sodium chloride infusion  250 mL Intravenous PRN Delight OvensGerhardt, Edward B, MD      . acetaminophen (TYLENOL) tablet 650 mg  650 mg Oral Q4H PRN Purcell Nailswen, Clarence H, MD   650 mg at 01/15/18 2011  . aspirin EC tablet 325 mg  325 mg Oral Daily Delight OvensGerhardt, Edward B, MD   325 mg at 01/18/18 0834  . atorvastatin (LIPITOR) tablet 80 mg  80 mg Oral q1800 Delight OvensGerhardt, Edward B, MD   80 mg at 01/18/18 1702  . bisacodyl (DULCOLAX) EC tablet 10 mg  10 mg Oral Daily PRN Delight OvensGerhardt, Edward B, MD       Or  . bisacodyl (DULCOLAX) suppository 10 mg  10 mg Rectal Daily PRN Delight OvensGerhardt, Edward B, MD   10 mg at 01/17/18 2001  . Chlorhexidine Gluconate Cloth 2 % PADS 6 each  6 each Topical Daily Delight OvensGerhardt, Edward B, MD   6 each at 01/13/18 1647  . cyclobenzaprine (FLEXERIL) tablet 10 mg  10 mg Oral TID PRN Gershon CraneGold, Wayne E, PA-C   10 mg at 01/18/18 2125  . docusate sodium (COLACE) capsule 200 mg  200 mg Oral Daily Delight OvensGerhardt, Edward B, MD   200 mg at 01/18/18 0834  . enoxaparin (LOVENOX)  injection 40 mg  40 mg Subcutaneous Daily Delight Ovens, MD   40 mg at 01/18/18 0835  . ferrous sulfate tablet 325 mg  325 mg Oral BID WC Delight Ovens, MD   325 mg at 01/18/18 1702  . folic acid (FOLVITE) tablet 1 mg  1 mg Oral Daily Delight Ovens, MD   1 mg at 01/18/18 0834  . guaiFENesin (MUCINEX) 12 hr tablet 600 mg  600 mg Oral Q12H PRN Delight Ovens, MD   600 mg at 01/15/18 2011  . levalbuterol (XOPENEX) nebulizer solution 0.63 mg  0.63 mg Nebulization Q6H PRN Sherren Mocha, MD      . levofloxacin [facility]) tablet 500 mg  500 mg Oral Daily Doree Fudge M, PA-C   500 mg at 01/18/18 0844  . MEDLINE mouth rinse  15 mL Mouth Rinse BID Delight Ovens, MD   15 mL at 01/18/18 2122  . metoprolol tartrate (LOPRESSOR) tablet 75 mg  75 mg Oral Q12H  Othella Boyer, MD   75 mg at 01/18/18 2121  . ondansetron (ZOFRAN) tablet 4 mg  4 mg Oral Q6H PRN Delight Ovens, MD       Or  . ondansetron [facility]) injection 4 mg  4 mg Intravenous Q6H PRN Delight Ovens, MD   4 mg at 01/16/18 1023  . oxyCODONE (Oxy IR/ROXICODONE) immediate release tablet 5-10 mg  5-10 mg Oral Q3H PRN Delight Ovens, MD   10 mg at 01/18/18 2123  . pantoprazole (PROTONIX) EC tablet 40 mg  40 mg Oral QAC breakfast Delight Ovens, MD   40 mg at 01/18/18 0604  . polyethylene glycol (MIRALAX / GLYCOLAX) packet 17 g  17 g Oral Daily Othella Boyer, MD   17 g at 01/18/18 1527  . pregabalin (LYRICA) capsule 75 mg  75 mg Oral BID Delight Ovens, MD   75 mg at 01/18/18 2124  . sodium chloride flush (NS) 0.9 % injection 3 mL  3 mL Intravenous Q12H Delight Ovens, MD   3 mL at 01/18/18 2124  . sodium chloride flush (NS) 0.9 % injection 3 mL  3 mL Intravenous PRN Delight Ovens, MD      . traMADol Janean Sark) tablet 50-100 mg  50-100 mg Oral Q4H PRN Delight Ovens, MD   50 mg at 01/16/18 2105   Current Outpatient Medications on File Prior to Visit  Medication Sig Dispense Refill  . aspirin EC 81 MG tablet Take 81 mg by mouth daily.    . fluticasone (FLONASE) 50 MCG/ACT nasal spray Place 2 sprays into both nostrils daily. 16 g 2  . nitroGLYCERIN (NITROSTAT) 0.4 MG SL tablet DISSOLVE 1 TABLET UNDER THE TONGUE EVERY 5 MINUTES FOR CHEST PAIN 25 tablet 0  . omeprazole (PRILOSEC) 20 MG capsule Take 1 capsule (20 mg total) by mouth daily. 90 capsule 1  . ondansetron (ZOFRAN-ODT) 8 MG disintegrating tablet Take 0.5-1 tablets (4-8 mg total) by mouth every 8 (eight) hours as needed for nausea. 30 tablet 0  . aspirin EC 325 MG EC tablet Take 1 tablet (325 mg total) by mouth daily. 30 tablet 0  . atorvastatin (LIPITOR) 80 MG tablet Take 1 tablet (80 mg total) by mouth daily at 6 PM. 30 tablet 1  . butalbital-acetaminophen-caffeine (FIORICET, ESGIC) 50-325-40 MG  tablet Take 1-2 tablets by mouth every 6 (six) hours as needed for headache. (Patient not taking: Reported on 12/30/2017) 20 tablet 0  . [  START ON 02/27/2018] diclofenac (VOLTAREN) 75 MG EC tablet Take 1 tablet (75 mg total) by mouth 2 (two) times daily. 30 tablet 0  . ferrous sulfate 325 (65 FE) MG tablet Take 1 tablet (325 mg total) by mouth daily with breakfast. For one month then stop.  3  . levofloxacin (LEVAQUIN) 500 MG tablet Take 1 tablet (500 mg total) by mouth daily. 7 tablet 0  . oxyCODONE (OXY IR/ROXICODONE) 5 MG immediate release tablet Take 5 mg by mouth every 4-6 hours PRN severe pain. 30 tablet 0  . Vitamin D, Ergocalciferol, (DRISDOL) 50000 units CAPS capsule Take 1 capsule (50,000 Units total) every 7 (seven) days by mouth. (Patient not taking: Reported on 12/30/2017) 12 capsule 1   Allergies  Allergen Reactions  . Contrast Media [Iodinated Diagnostic Agents] Anaphylaxis    Had CT scan 12/14/17, had trouble breathing, throat swelled  . Penicillins Rash and Other (See Comments)    PATIENT HAS HAD A PCN REACTION WITH IMMEDIATE RASH, FACIAL/TONGUE/THROAT SWELLING, SOB, OR LIGHTHEADEDNESS WITH HYPOTENSION:  #  #  YES  #  #  Has patient had a PCN reaction causing severe rash involving mucus membranes or skin necrosis:  no Has patient had a PCN reaction that required hospitalization:  no Has patient had a PCN reaction occurring within the last 10 years: no   . Codeine Anxiety    Dizziness and headache   Family History  Problem Relation Age of Onset  . Thyroid disease Mother   . Brain cancer Sister    Social History   Socioeconomic History  . Marital status: Single    Spouse name: Alcario Drought  . Number of children: 2  . Years of education: 12th grade  . Highest education level: Not on file  Occupational History  . Occupation: Warden/ranger: [employer]  Social Needs  . Financial resource strain: Not on file  . Food insecurity:    Worry: Not on file    Inability:  Not on file  . Transportation needs:    Medical: Not on file    Non-medical: Not on file  Tobacco Use  . Smoking status: Never Smoker  . Smokeless tobacco: Never Used  Substance and Sexual Activity  . Alcohol use: No  . Drug use: No  . Sexual activity: Not Currently  Lifestyle  . Physical activity:    Days per week: Not on file    Minutes per session: Not on file  . Stress: Not on file  Relationships  . Social connections:    Talks on phone: Not on file    Gets together: Not on file    Attends religious service: Not on file    Active member of club or organization: Not on file    Attends meetings of clubs or organizations: Not on file    Relationship status: Not on file  Other Topics Concern  . Not on file  Social History Narrative   Divorced from her husband. She has two adult sons from that union . One lives in [place], the other lives in [place] (as a Solicitor) following Office manager.   Married Alcario Drought 05/2014.   Depression screen [facility] 2/9 01/02/2018 12/15/2017 11/14/2017 11/04/2017 04/04/2017  Decreased Interest 0 0 0 0 0  Down, Depressed, Hopeless 0 0 0 0 0  PHQ - 2 Score 0 0 0 0 0      Review of Systems  Constitutional: Positive for activity change. Negative for appetite change, chills,  diaphoresis, fatigue and fever.  Cardiovascular: Positive for chest pain and leg swelling.  Gastrointestinal: Negative for abdominal pain, constipation and diarrhea.  Musculoskeletal: Positive for arthralgias, back pain, myalgias, neck pain and neck stiffness. Negative for gait problem and joint swelling.  Neurological: Positive for weakness, numbness and headaches. Negative for dizziness.  Psychiatric/Behavioral: Negative for agitation, behavioral problems, dysphoric mood and sleep disturbance. The patient is nervous/anxious.        Objective:   Physical Exam  Constitutional: She is oriented to person, place, and time. She appears well-developed and well-nourished. No distress.  HENT:    Head: Normocephalic and atraumatic.  Right Ear: External ear normal.  Eyes: Conjunctivae are normal. No scleral icterus.  Neck: Spinous process tenderness and muscular tenderness present. Decreased range of motion present. No thyromegaly present.  Pulmonary/Chest: Effort normal.  Neurological: She is alert and oriented to person, place, and time.  Skin: Skin is warm and dry. She is not diaphoretic. No erythema.  Psychiatric: She has a normal mood and affect. Her behavior is normal.      BP 128/72 (BP Location: Right Arm, Patient Position: Sitting, Cuff Size: Normal)   Pulse 89   Temp 99 F (37.2 C) (Oral)   Resp 17   Ht 5' 2.5" (1.588 m)   Wt 133 lb 3.2 oz (60.4 kg)   SpO2 100%   BMI 23.97 kg/m      Assessment & Plan:   1. Unstable angina (HCC)   2. Cervicalgia   3. Anxiety about health     Meds ordered this encounter  Medications  . diclofenac (VOLTAREN) 75 MG EC tablet    Sig: Take 1 tablet (75 mg total) by mouth 2 (two) times daily.    Dispense:  30 tablet    Refill:  0  . cyclobenzaprine (FLEXERIL) 10 MG tablet    Sig: Take 0.5-1 tablets (5-10 mg total) by mouth 3 (three) times daily as needed for muscle spasms.    Dispense:  30 tablet    Refill:  0  . pregabalin (LYRICA) 75 MG capsule    Sig: Take 1 capsule (75 mg total) by mouth 2 (two) times daily.    Dispense:  60 capsule    Refill:  1  . ALPRAZolam (XANAX) 0.5 MG tablet    Sig: Take 1/2-1 tab po every 4 hours as needed for anxiety and take 2-4 tabs po qhs prn sleep    Dispense:  20 tablet    Refill:  0    Norberto Sorenson, M.D.  Primary Care at [facility] [address] [place], [place] [address] [phone] phone [phone] fax  01/19/18 1:24 AM

## 2018-01-02 NOTE — Patient Instructions (Addendum)
Try the alprazolam as needed for NOW whenever you are getting worried and anxious about the procedures.  Try taking 1/2 to 1 tab as needed during the day whenever you are worried about the procedure or your heart - hopefully this will lead to less chest pain.   Both the cyclobenzaprine which is a muscle relaxer and the lyrica which is a nerve pill (to replace the gabaptenin) will also make you tried so do not combine at first.  Generally, It is ok to take the diclofenac with this BUT FOR NOW - BECAUSE OF THE UPCOMING SURGERY - DO NOT TAKE ANY DICLOFENAC (same goes for any other otc pain medication other than tylenol/acetaminophen - so no aleve, ibuprofen, motrin, advil, BC, Goody', etc)  until 5d after your surgeryl    IF you received an x-ray today, you will receive an invoice from [facility]. Please contact [facility] at [phone] with questions or concerns regarding your invoice.   IF you received labwork today, you will receive an invoice from [facility]. Please contact [facility] at [phone] with questions or concerns regarding your invoice.   Our billing staff will not be able to assist you with questions regarding bills from these companies.  You will be contacted with the lab results as soon as they are available. The fastest way to get your results is to activate your My Chart account. Instructions are located on the last page of this paperwork. If you have not heard from [place] regarding the results in 2 weeks, please contact this office.     Coronary Angiogram With Stent Coronary angiogram with stent placement is a procedure to widen or open a narrow blood vessel of the heart (coronary artery). Arteries may become blocked by cholesterol buildup (plaques) in the lining of the wall. When a coronary artery becomes partially blocked, blood flow to that area decreases. This may lead to chest pain or a heart attack (myocardial infarction). A stent is a small piece of metal  that looks like mesh or a spring. Stent placement may be done as treatment for a heart attack or right after a coronary angiogram in which a blocked artery is found. Let your health care provider know about:  Any allergies you have.  All medicines you are taking, including vitamins, herbs, eye drops, creams, and over-the-counter medicines.  Any problems you or family members have had with anesthetic medicines.  Any blood disorders you have.  Any surgeries you have had.  Any medical conditions you have.  Whether you are pregnant or may be pregnant. What are the risks? Generally, this is a safe procedure. However, problems may occur, including:  Damage to the heart or its blood vessels.  A return of blockage.  Bleeding, infection, or bruising at the insertion site.  A collection of blood under the skin (hematoma) at the insertion site.  A blood clot in another part of the body.  Kidney injury.  Allergic reaction to the dye or contrast that is used.  Bleeding into the abdomen (retroperitoneal bleeding).  What happens before the procedure? Staying hydrated Follow instructions from your health care provider about hydration, which may include:  Up to 2 hours before the procedure - you may continue to drink clear liquids, such as water, clear fruit juice, black coffee, and plain tea.  Eating and drinking restrictions Follow instructions from your health care provider about eating and drinking, which may include:  8 hours before the procedure - stop eating heavy meals or foods  such as meat, fried foods, or fatty foods.  6 hours before the procedure - stop eating light meals or foods, such as toast or cereal.  2 hours before the procedure - stop drinking clear liquids.  Ask your health care provider about:  Changing or stopping your regular medicines. This is especially important if you are taking diabetes medicines or blood thinners.  Taking medicines such as ibuprofen.  These medicines can thin your blood. Do not take these medicines before your procedure if your health care provider instructs you not to. Generally, aspirin is recommended before a procedure of passing a small, thin tube (catheter) through a blood vessel and into the heart (cardiac catheterization).  What happens during the procedure?  An IV tube will be inserted into one of your veins.  You will be given one or more of the following: ? A medicine to help you relax (sedative). ? A medicine to numb the area where the catheter will be inserted into an artery (local anesthetic).  To reduce your risk of infection: ? Your health care team will wash or sanitize their hands. ? Your skin will be washed with soap. ? Hair may be removed from the area where the catheter will be inserted.  Using a guide wire, the catheter will be inserted into an artery. The location may be in your groin, in your wrist, or in the fold of your arm (near your elbow).  A type of X-ray (fluoroscopy) will be used to help guide the catheter to the opening of the arteries in the heart.  A dye will be injected into the catheter, and X-rays will be taken. The dye will help to show where any narrowing or blockages are located in the arteries.  A tiny wire will be guided to the blocked spot, and a balloon will be inflated to make the artery wider.  The stent will be expanded and will crush the plaques into the wall of the vessel. The stent will hold the area open and improve the blood flow. Most stents have a drug coating to reduce the risk of the stent narrowing over time.  The artery may be made wider using a drill, laser, or other tools to remove plaques.  When the blood flow is better, the catheter will be removed. The lining of the artery will grow over the stent, which stays where it was placed. This procedure may vary among health care providers and hospitals. What happens after the procedure?  If the procedure is  done through the leg, you will be kept in bed lying flat for about 6 hours. You will be instructed to not bend and not cross your legs.  The insertion site will be checked frequently.  The pulse in your foot or wrist will be checked frequently.  You may have additional blood tests, X-rays, and a test that records the electrical activity of your heart (electrocardiogram, or ECG). This information is not intended to replace advice given to you by your health care provider. Make sure you discuss any questions you have with your health care provider. Document Released: 11/21/2002 Document Revised: 01/15/2016 Document Reviewed: 12/21/2015 Elsevier Interactive Patient Education  2018 [employer]   Panic Attack A panic attack is a sudden episode of severe anxiety, fear, or discomfort that causes physical and emotional symptoms. The attack may be in response to something frightening, or it may occur for no known reason. Symptoms of a panic attack can be similar to symptoms of a  heart attack or stroke. It is important to see your health care provider when you have a panic attack so that these conditions can be ruled out. A panic attack is a symptom of another condition. Most panic attacks go away with treatment of the underlying problem. If you have panic attacks often, you may have a condition called panic disorder. What are the causes? A panic attack may be caused by:  An extreme, life-threatening situation, such as a war or natural disaster.  An anxiety disorder, such as post-traumatic stress disorder.  Depression.  Certain medical conditions, including heart problems, neurological conditions, and infections.  Certain over-the-counter and prescription medicines.  Illegal drugs that increase heart rate and blood pressure, such as methamphetamine.  Alcohol.  Supplements that increase anxiety.  Panic disorder.  What increases the risk? You are more likely to develop this condition  if:  You have an anxiety disorder.  You have another mental health condition.  You take certain medicines.  You use alcohol, illegal drugs, or other substances.  You are under extreme stress.  A life event is causing increased feelings of anxiety and depression.  What are the signs or symptoms? A panic attack starts suddenly, usually lasts about 20 minutes, and occurs with one or more of the following:  A pounding heart.  A feeling that your heart is beating irregularly or faster than normal (palpitations).  Sweating.  Trembling or shaking.  Shortness of breath or feeling smothered.  Feeling choked.  Chest pain or discomfort.  Nausea or a strange feeling in your stomach.  Dizziness, feeling lightheaded, or feeling like you might faint.  Chills or hot flashes.  Numbness or tingling in your lips, hands, or feet.  Feeling confused, or feeling that you are not yourself.  Fear of losing control or being emotionally unstable.  Fear of dying.  How is this diagnosed? A panic attack is diagnosed with an assessment by your health care provider. During the assessment your health care provider will ask questions about:  Your history of anxiety, depression, and panic attacks.  Your medical history.  Whether you drink alcohol, use illegal drugs, take supplements, or take medicines. Be honest about your substance use.  Your health care provider may also:  Order blood tests or other kinds of tests to rule out serious medical conditions.  Refer you to a mental health professional for further evaluation.  How is this treated? Treatment depends on the cause of the panic attack:  If the cause is a medical problem, your health care provider will either treat that problem or refer you to a specialist.  If the cause is emotional, you may be given anti-anxiety medicines or referred to a counselor. These medicines may reduce how often attacks happen, reduce how severe the  attacks are, and lower anxiety.  If the cause is a medicine, your health care provider may tell you to stop the medicine, change your dose, or take a different medicine.  If the cause is a drug, treatment may involve letting the drug wear off and taking medicine to help the drug leave your body or to counteract its effects. Attacks caused by drug abuse may continue even if you stop using the drug.  Follow these instructions at home:  Take over-the-counter and prescription medicines only as told by your health care provider.  If you feel anxious, limit your caffeine intake.  Take good care of your physical and mental health by: ? Eating a balanced diet that includes  plenty of fresh fruits and vegetables, whole grains, lean meats, and low-fat dairy. ? Getting plenty of rest. Try to get 7-8 hours of uninterrupted sleep each night. ? Exercising regularly. Try to get 30 minutes of physical activity at least 5 days a week. ? Not smoking. Talk to your health care provider if you need help quitting. ? Limiting alcohol intake to no more than 1 drink a day for nonpregnant women and 2 drinks a day for men. One drink equals 12 oz of beer, 5 oz of wine, or 1 oz of hard liquor.  Keep all follow-up visits as told by your health care provider. This is important. Panic attacks may have underlying physical or emotional problems that take time to accurately diagnose. Contact a health care provider if:  Your symptoms do not improve, or they get worse.  You are not able to take your medicine as prescribed because of side effects. Get help right away if:  You have serious thoughts about hurting yourself or others.  You have symptoms of a panic attack. Do not drive yourself to the hospital. Have someone else drive you or call an ambulance. If you ever feel like you may hurt yourself or others, or you have thoughts about taking your own life, get help right away. You can go to your nearest emergency  department or call:  Your local emergency services (911 in the [place]  A suicide crisis helpline, such as the National Suicide Prevention Lifeline at 825-589-4911. This is open 24 hours a day.  Summary  A panic attack is a sign of a serious health or mental health condition. Get help right away. Do not drive yourself to the hospital. Have someone else drive you or call an ambulance.  Always see a health care provider to have the reasons for the panic attack correctly diagnosed.  If your panic attack was caused by a physical problem, follow your health care provider's suggestions for medicine, referral to a specialist, and lifestyle changes.  If your panic attack was caused by an emotional problem, follow through with counseling from a qualified mental health specialist.  If you feel like you may hurt yourself or others, call 911 and get help right away. This information is not intended to replace advice given to you by your health care provider. Make sure you discuss any questions you have with your health care provider. Document Released: 05/17/2005 Document Revised: 06/25/2016 Document Reviewed: 06/25/2016 Elsevier Interactive Patient Education  [employer].

## 2018-01-03 ENCOUNTER — Telehealth: Payer: Self-pay | Admitting: Family Medicine

## 2018-01-03 MED ORDER — ALPRAZOLAM 0.5 MG PO TABS: ORAL_TABLET | ORAL | 0 refills | Status: DC

## 2018-01-03 MED ORDER — PREGABALIN 75 MG PO CAPS: 70.0000 mg | ORAL_CAPSULE | Freq: Two times a day (BID) | ORAL | 1 refills | Status: DC

## 2018-01-03 MED ORDER — DICLOFENAC SODIUM 75 MG PO TBEC: 75.0000 mg | DELAYED_RELEASE_TABLET | Freq: Two times a day (BID) | ORAL | 0 refills | Status: DC

## 2018-01-03 MED ORDER — CYCLOBENZAPRINE HCL 10 MG PO TABS: 5.0000 mg | ORAL_TABLET | Freq: Three times a day (TID) | ORAL | 0 refills | Status: DC | PRN

## 2018-01-03 NOTE — Telephone Encounter (Signed)
Copied from CRM [id number]. Topic: Quick Communication - See Telephone Encounter >> Jan 03, 2018  7:44 AM Windy KalataMichael, Aerion Bagdasarian L, NT wrote: CRM for notification. See Telephone encounter for: 01/03/18.  Patient is calling and states that several prescriptions were supposed to be called in yesterday 01/02/18 after her visit with her. I do not see where any has been called in. Please advise as patient is looking for these prescriptions.   [facility] [phone] [place] - [address] AT [facility] [address] [place] [id number] Phone: [phone] Fax: [phone]

## 2018-01-05 ENCOUNTER — Other Ambulatory Visit: Payer: Self-pay

## 2018-01-05 ENCOUNTER — Inpatient Hospital Stay (HOSPITAL_COMMUNITY)
Admission: AD | Admit: 2018-01-05 | Discharge: 2018-01-19 | DRG: 233 | Disposition: A | Discharge status: Home / Self Care | Payer: BLUE CROSS/BLUE SHIELD | Source: Ambulatory Visit | Attending: Cardiothoracic Surgery | Admitting: Cardiothoracic Surgery

## 2018-01-05 ENCOUNTER — Encounter (HOSPITAL_COMMUNITY)
Admission: AD | Disposition: A | Discharge status: Home / Self Care | Payer: Self-pay | Source: Ambulatory Visit | Attending: Cardiothoracic Surgery

## 2018-01-05 ENCOUNTER — Other Ambulatory Visit (HOSPITAL_COMMUNITY): Payer: Self-pay

## 2018-01-05 ENCOUNTER — Encounter (HOSPITAL_COMMUNITY): Payer: Self-pay | Admitting: General Practice

## 2018-01-05 DIAGNOSIS — Z880 Allergy status to penicillin: Secondary | ICD-10-CM

## 2018-01-05 DIAGNOSIS — Z951 Presence of aortocoronary bypass graft: Secondary | ICD-10-CM

## 2018-01-05 DIAGNOSIS — I119 Hypertensive heart disease without heart failure: Secondary | ICD-10-CM | POA: Diagnosis present

## 2018-01-05 DIAGNOSIS — D62 Acute posthemorrhagic anemia: Secondary | ICD-10-CM | POA: Diagnosis not present

## 2018-01-05 DIAGNOSIS — K219 Gastro-esophageal reflux disease without esophagitis: Secondary | ICD-10-CM | POA: Diagnosis present

## 2018-01-05 DIAGNOSIS — R000 Tachycardia, unspecified: Secondary | ICD-10-CM | POA: Diagnosis not present

## 2018-01-05 DIAGNOSIS — E785 Hyperlipidemia, unspecified: Secondary | ICD-10-CM

## 2018-01-05 DIAGNOSIS — D509 Iron deficiency anemia, unspecified: Secondary | ICD-10-CM | POA: Diagnosis present

## 2018-01-05 DIAGNOSIS — Z885 Allergy status to narcotic agent status: Secondary | ICD-10-CM

## 2018-01-05 DIAGNOSIS — I25110 Atherosclerotic heart disease of native coronary artery with unstable angina pectoris: Secondary | ICD-10-CM | POA: Diagnosis present

## 2018-01-05 DIAGNOSIS — I200 Unstable angina: Secondary | ICD-10-CM | POA: Diagnosis present

## 2018-01-05 DIAGNOSIS — E8770 Fluid overload, unspecified: Secondary | ICD-10-CM | POA: Diagnosis not present

## 2018-01-05 DIAGNOSIS — Z79899 Other long term (current) drug therapy: Secondary | ICD-10-CM | POA: Diagnosis not present

## 2018-01-05 DIAGNOSIS — J40 Bronchitis, not specified as acute or chronic: Secondary | ICD-10-CM | POA: Diagnosis not present

## 2018-01-05 DIAGNOSIS — I209 Angina pectoris, unspecified: Secondary | ICD-10-CM | POA: Diagnosis present

## 2018-01-05 DIAGNOSIS — R509 Fever, unspecified: Secondary | ICD-10-CM

## 2018-01-05 DIAGNOSIS — R110 Nausea: Secondary | ICD-10-CM | POA: Diagnosis not present

## 2018-01-05 DIAGNOSIS — I361 Nonrheumatic tricuspid (valve) insufficiency: Secondary | ICD-10-CM | POA: Diagnosis not present

## 2018-01-05 DIAGNOSIS — Z91041 Radiographic dye allergy status: Secondary | ICD-10-CM | POA: Diagnosis not present

## 2018-01-05 DIAGNOSIS — D696 Thrombocytopenia, unspecified: Secondary | ICD-10-CM | POA: Diagnosis not present

## 2018-01-05 DIAGNOSIS — K5900 Constipation, unspecified: Secondary | ICD-10-CM | POA: Diagnosis not present

## 2018-01-05 DIAGNOSIS — I2510 Atherosclerotic heart disease of native coronary artery without angina pectoris: Secondary | ICD-10-CM | POA: Diagnosis present

## 2018-01-05 DIAGNOSIS — Z01810 Encounter for preprocedural cardiovascular examination: Secondary | ICD-10-CM | POA: Diagnosis not present

## 2018-01-05 DIAGNOSIS — E119 Type 2 diabetes mellitus without complications: Secondary | ICD-10-CM | POA: Diagnosis present

## 2018-01-05 DIAGNOSIS — Z7982 Long term (current) use of aspirin: Secondary | ICD-10-CM | POA: Diagnosis not present

## 2018-01-05 DIAGNOSIS — Z09 Encounter for follow-up examination after completed treatment for conditions other than malignant neoplasm: Secondary | ICD-10-CM

## 2018-01-05 DIAGNOSIS — I25118 Atherosclerotic heart disease of native coronary artery with other forms of angina pectoris: Secondary | ICD-10-CM | POA: Diagnosis not present

## 2018-01-05 DIAGNOSIS — I25119 Atherosclerotic heart disease of native coronary artery with unspecified angina pectoris: Secondary | ICD-10-CM

## 2018-01-05 DIAGNOSIS — Z8249 Family history of ischemic heart disease and other diseases of the circulatory system: Secondary | ICD-10-CM

## 2018-01-05 DIAGNOSIS — I25759 Atherosclerosis of native coronary artery of transplanted heart with unspecified angina pectoris: Secondary | ICD-10-CM | POA: Diagnosis not present

## 2018-01-05 DIAGNOSIS — Z419 Encounter for procedure for purposes other than remedying health state, unspecified: Secondary | ICD-10-CM

## 2018-01-05 DIAGNOSIS — J189 Pneumonia, unspecified organism: Secondary | ICD-10-CM | POA: Diagnosis not present

## 2018-01-05 DIAGNOSIS — I252 Old myocardial infarction: Secondary | ICD-10-CM

## 2018-01-05 DIAGNOSIS — Z01818 Encounter for other preprocedural examination: Secondary | ICD-10-CM

## 2018-01-05 DIAGNOSIS — J939 Pneumothorax, unspecified: Secondary | ICD-10-CM

## 2018-01-05 DIAGNOSIS — J90 Pleural effusion, not elsewhere classified: Secondary | ICD-10-CM

## 2018-01-05 DIAGNOSIS — I340 Nonrheumatic mitral (valve) insufficiency: Secondary | ICD-10-CM | POA: Diagnosis not present

## 2018-01-05 HISTORY — DX: Atherosclerotic heart disease of native coronary artery without angina pectoris: I25.10

## 2018-01-05 HISTORY — PX: LEFT HEART CATH AND CORONARY ANGIOGRAPHY: CATH118249

## 2018-01-05 SURGERY — LEFT HEART CATH AND CORONARY ANGIOGRAPHY
Anesthesia: LOCAL

## 2018-01-05 MED ORDER — PREGABALIN 75 MG PO CAPS
70.0000 mg | ORAL_CAPSULE | Freq: Two times a day (BID) | ORAL | Status: DC
  Administered 2018-01-05 – 2018-01-19 (×27): 75 mg via ORAL
  Filled 2018-01-05 (×27): qty 1

## 2018-01-05 MED ORDER — ASPIRIN 81 MG PO CHEW: 81.0000 mg | CHEWABLE_TABLET | ORAL | Status: DC

## 2018-01-05 MED ORDER — DIPHENHYDRAMINE HCL 25 MG PO CAPS
50.0000 mg | ORAL_CAPSULE | Freq: Once | ORAL | Status: AC
  Administered 2018-01-05: 50 mg via ORAL
  Filled 2018-01-05: qty 2

## 2018-01-05 MED ORDER — VERAPAMIL HCL 2.5 MG/ML IV SOLN
INTRAVENOUS | Status: DC | PRN
  Administered 2018-01-05: 10 mL via INTRA_ARTERIAL

## 2018-01-05 MED ORDER — METOPROLOL SUCCINATE ER 50 MG PO TB24
50.0000 mg | ORAL_TABLET | Freq: Every day | ORAL | Status: DC
  Administered 2018-01-05: 50 mg via ORAL

## 2018-01-05 MED ORDER — METOPROLOL SUCCINATE ER 50 MG PO TB24
50.0000 mg | ORAL_TABLET | Freq: Every day | ORAL | Status: DC
  Administered 2018-01-05 – 2018-01-09 (×5): 50 mg via ORAL
  Filled 2018-01-05 (×5): qty 1

## 2018-01-05 MED ORDER — HYDRALAZINE HCL 20 MG/ML IJ SOLN
INTRAMUSCULAR | Status: DC | PRN
  Administered 2018-01-05: 10 mg via INTRAVENOUS

## 2018-01-05 MED ORDER — DIAZEPAM 5 MG PO TABS
ORAL_TABLET | ORAL | Status: AC
  Administered 2018-01-05: 5 mg via ORAL
  Filled 2018-01-05: qty 1

## 2018-01-05 MED ORDER — SODIUM CHLORIDE 0.9% FLUSH: 3.0000 mL | INTRAVENOUS | Status: DC | PRN

## 2018-01-05 MED ORDER — SODIUM CHLORIDE 0.9 % IV SOLN: INTRAVENOUS | Status: AC

## 2018-01-05 MED ORDER — ASPIRIN EC 81 MG PO TBEC
81.0000 mg | DELAYED_RELEASE_TABLET | Freq: Every day | ORAL | Status: DC
  Administered 2018-01-05: 81 mg via ORAL

## 2018-01-05 MED ORDER — DIPHENHYDRAMINE HCL 50 MG/ML IJ SOLN: 50.0000 mg | Freq: Once | INTRAMUSCULAR | Status: AC

## 2018-01-05 MED ORDER — CYCLOBENZAPRINE HCL 10 MG PO TABS
5.0000 mg | ORAL_TABLET | Freq: Three times a day (TID) | ORAL | Status: DC | PRN
  Administered 2018-01-08 – 2018-01-09 (×3): 10 mg via ORAL
  Filled 2018-01-05 (×3): qty 1

## 2018-01-05 MED ORDER — HEPARIN BOLUS VIA INFUSION
3500.0000 [IU] | Freq: Once | INTRAVENOUS | Status: DC
  Filled 2018-01-05: qty 3500

## 2018-01-05 MED ORDER — ONDANSETRON HCL 4 MG/2ML IJ SOLN: 4.0000 mg | Freq: Four times a day (QID) | INTRAMUSCULAR | Status: DC | PRN

## 2018-01-05 MED ORDER — HEPARIN (PORCINE) IN NACL 100-0.45 UNIT/ML-% IJ SOLN
700.0000 [IU]/h | INTRAMUSCULAR | Status: DC
  Administered 2018-01-05: 700 [IU]/h via INTRAVENOUS
  Administered 2018-01-06 – 2018-01-08 (×2): 750 [IU]/h via INTRAVENOUS
  Administered 2018-01-09: 700 [IU]/h via INTRAVENOUS
  Filled 2018-01-05 (×5): qty 250

## 2018-01-05 MED ORDER — FENTANYL CITRATE (PF) 100 MCG/2ML IJ SOLN
INTRAMUSCULAR | Status: AC
  Filled 2018-01-05: qty 2

## 2018-01-05 MED ORDER — HEPARIN SODIUM (PORCINE) 1000 UNIT/ML IJ SOLN
INTRAMUSCULAR | Status: DC | PRN
  Administered 2018-01-05: 3000 [IU] via INTRAVENOUS

## 2018-01-05 MED ORDER — ASPIRIN 81 MG PO CHEW
81.0000 mg | CHEWABLE_TABLET | Freq: Every day | ORAL | Status: DC
  Administered 2018-01-05 – 2018-01-09 (×6): 81 mg via ORAL
  Filled 2018-01-05 (×5): qty 1

## 2018-01-05 MED ORDER — ACETAMINOPHEN 325 MG PO TABS: 650.0000 mg | ORAL_TABLET | ORAL | Status: DC | PRN

## 2018-01-05 MED ORDER — IOHEXOL 350 MG/ML SOLN
INTRAVENOUS | Status: DC | PRN
  Administered 2018-01-05: 90 mL via INTRAVENOUS

## 2018-01-05 MED ORDER — LIDOCAINE HCL (PF) 1 % IJ SOLN
INTRAMUSCULAR | Status: AC
  Filled 2018-01-05: qty 30

## 2018-01-05 MED ORDER — SODIUM CHLORIDE 0.9% FLUSH
3.0000 mL | Freq: Two times a day (BID) | INTRAVENOUS | Status: DC
  Administered 2018-01-05 – 2018-01-08 (×6): 3 mL via INTRAVENOUS

## 2018-01-05 MED ORDER — HYDRALAZINE HCL 20 MG/ML IJ SOLN
INTRAMUSCULAR | Status: AC
  Filled 2018-01-05: qty 1

## 2018-01-05 MED ORDER — METOPROLOL SUCCINATE ER 25 MG PO TB24
ORAL_TABLET | ORAL | Status: AC
  Filled 2018-01-05: qty 2

## 2018-01-05 MED ORDER — HEPARIN SODIUM (PORCINE) 1000 UNIT/ML IJ SOLN
INTRAMUSCULAR | Status: AC
  Filled 2018-01-05: qty 1

## 2018-01-05 MED ORDER — SODIUM CHLORIDE 0.9 % IV SOLN: 250.0000 mL | INTRAVENOUS | Status: DC | PRN

## 2018-01-05 MED ORDER — NITROGLYCERIN 0.4 MG SL SUBL
0.4000 mg | SUBLINGUAL_TABLET | SUBLINGUAL | Status: DC | PRN
  Filled 2018-01-05: qty 1

## 2018-01-05 MED ORDER — HEPARIN (PORCINE) IN NACL 1000-0.9 UT/500ML-% IV SOLN
INTRAVENOUS | Status: AC
  Filled 2018-01-05: qty 1000

## 2018-01-05 MED ORDER — PREDNISONE 20 MG PO TABS
50.0000 mg | ORAL_TABLET | Freq: Once | ORAL | Status: AC
  Administered 2018-01-05: 50 mg via ORAL
  Filled 2018-01-05: qty 3

## 2018-01-05 MED ORDER — ATORVASTATIN CALCIUM 40 MG PO TABS: 40.0000 mg | ORAL_TABLET | Freq: Every day | ORAL | Status: DC

## 2018-01-05 MED ORDER — DIAZEPAM 5 MG PO TABS
5.0000 mg | ORAL_TABLET | ORAL | Status: DC | PRN
  Administered 2018-01-05: 5 mg via ORAL

## 2018-01-05 MED ORDER — ATORVASTATIN CALCIUM 80 MG PO TABS
80.0000 mg | ORAL_TABLET | Freq: Every day | ORAL | Status: DC
  Administered 2018-01-05 – 2018-01-18 (×14): 80 mg via ORAL
  Filled 2018-01-05 (×13): qty 1

## 2018-01-05 MED ORDER — PANTOPRAZOLE SODIUM 40 MG PO TBEC
40.0000 mg | DELAYED_RELEASE_TABLET | Freq: Every day | ORAL | Status: DC
  Administered 2018-01-05 – 2018-01-08 (×4): 40 mg via ORAL
  Filled 2018-01-05 (×4): qty 1

## 2018-01-05 MED ORDER — SODIUM CHLORIDE 0.9 % WEIGHT BASED INFUSION
3.0000 mL/kg/h | INTRAVENOUS | Status: DC
  Administered 2018-01-05: 3 mL/kg/h via INTRAVENOUS

## 2018-01-05 MED ORDER — FENTANYL CITRATE (PF) 100 MCG/2ML IJ SOLN
INTRAMUSCULAR | Status: DC | PRN
  Administered 2018-01-05: 25 ug via INTRAVENOUS

## 2018-01-05 MED ORDER — NITROGLYCERIN 0.4 MG SL SUBL
SUBLINGUAL_TABLET | SUBLINGUAL | Status: AC
  Filled 2018-01-05: qty 1

## 2018-01-05 MED ORDER — SODIUM CHLORIDE 0.9% FLUSH: 3.0000 mL | Freq: Two times a day (BID) | INTRAVENOUS | Status: DC

## 2018-01-05 MED ORDER — DICLOFENAC SODIUM 75 MG PO TBEC
75.0000 mg | DELAYED_RELEASE_TABLET | Freq: Two times a day (BID) | ORAL | Status: DC
  Administered 2018-01-05: 75 mg via ORAL
  Filled 2018-01-05 (×2): qty 1

## 2018-01-05 MED ORDER — ALPRAZOLAM 0.5 MG PO TABS: 0.2500 mg | ORAL_TABLET | Freq: Once | ORAL | Status: DC

## 2018-01-05 MED ORDER — LIDOCAINE HCL (PF) 1 % IJ SOLN
INTRAMUSCULAR | Status: DC | PRN
  Administered 2018-01-05: 5 mL

## 2018-01-05 MED ORDER — MIDAZOLAM HCL 2 MG/2ML IJ SOLN
INTRAMUSCULAR | Status: AC
  Filled 2018-01-05: qty 2

## 2018-01-05 MED ORDER — SODIUM CHLORIDE 0.9 % WEIGHT BASED INFUSION: 1.0000 mL/kg/h | INTRAVENOUS | Status: DC

## 2018-01-05 MED ORDER — HEPARIN (PORCINE) IN NACL 1000-0.9 UT/500ML-% IV SOLN
INTRAVENOUS | Status: DC | PRN
  Administered 2018-01-05 (×2): 500 mL

## 2018-01-05 MED ORDER — MIDAZOLAM HCL 2 MG/2ML IJ SOLN
INTRAMUSCULAR | Status: DC | PRN
  Administered 2018-01-05: 1 mg via INTRAVENOUS

## 2018-01-05 MED ORDER — ALPRAZOLAM 0.25 MG PO TABS
0.2500 mg | ORAL_TABLET | Freq: Three times a day (TID) | ORAL | Status: DC | PRN
  Administered 2018-01-05 – 2018-01-12 (×6): 0.25 mg via ORAL
  Filled 2018-01-05 (×6): qty 1

## 2018-01-05 MED ORDER — ONDANSETRON 4 MG PO TBDP
4.0000 mg | ORAL_TABLET | Freq: Three times a day (TID) | ORAL | Status: DC | PRN
  Filled 2018-01-05: qty 2

## 2018-01-05 MED ORDER — VERAPAMIL HCL 2.5 MG/ML IV SOLN
INTRAVENOUS | Status: AC
  Filled 2018-01-05: qty 2

## 2018-01-05 MED ORDER — NITROGLYCERIN 0.4 MG SL SUBL: 0.4000 mg | SUBLINGUAL_TABLET | SUBLINGUAL | Status: DC | PRN

## 2018-01-05 SURGICAL SUPPLY — 13 items
CATH INFINITI 5 FR JL3.5 (CATHETERS) ×1 IMPLANT
CATH INFINITI 5FR ANG PIGTAIL (CATHETERS) ×1 IMPLANT
CATH LAUNCHER 5F EBU3.5 (CATHETERS) ×1 IMPLANT
CATH OPTITORQUE TIG 4.0 5F (CATHETERS) ×1 IMPLANT
DEVICE RAD TR BAND REGULAR (VASCULAR PRODUCTS) ×1 IMPLANT
GLIDESHEATH SLEND SS 6F .021 (SHEATH) ×1 IMPLANT
GUIDEWIRE INQWIRE 1.5J.035X260 (WIRE) IMPLANT
INQWIRE 1.5J .035X260CM (WIRE) ×2
KIT HEART LEFT (KITS) ×2 IMPLANT
PACK CARDIAC CATHETERIZATION (CUSTOM PROCEDURE TRAY) ×2 IMPLANT
SYR MEDRAD MARK V 150ML (SYRINGE) ×1 IMPLANT
TRANSDUCER W/STOPCOCK (MISCELLANEOUS) ×2 IMPLANT
TUBING CIL FLEX 10 FLL-RA (TUBING) ×2 IMPLANT

## 2018-01-05 NOTE — Progress Notes (Signed)
**Note Joann Kulpa-Identified via Obfuscation** Patient arrived from cardiac catheterization lab complaining of chest discomfort and back pain around 0845. Per patient pain level was a 5/10. Nitroglycerin given x1 for chest pain. PRN dose of Valium given x1 for anxiety and back pain as ordered. Dr. Donnie Ahoilley present at bedside during patient's first episode of chest discomfort and elevated blood pressure and heart rate. Per Dr. Donnie Ahoilley give patient 50 mg metoprolol XL. Dr. Donnie Ahoilley states will plan for cardiovascular surgery consult and admit patient to inpatient telemetry unit. Patient complained of another episode of 6/10 chest discomfort around 1125, heart rate, and blood pressure elevated. Patient also complained of feeling mildly short of breath upon awakening. Patient placed on 2L oxygen for comfort. This RN notified Dr. Donnie Ahoilley of second episode and patient's request for Xanax for anxiety. Attempt made to administer nitroglycerin SL and patient stated pain has decreased to 1/10. Heart rate and blood pressure decreased (see flowsheet). Dr. Donnie Ahoilley arrived again to speak with patient and patient's mother about plan to admit to inpatient unit due to recurrent episodes of chest pain and need for cardiovascular surgical consult.

## 2018-01-05 NOTE — Interval H&P Note (Signed)
Cath Lab Visit (complete for each Cath Lab visit)  Clinical Evaluation Leading to the Procedure:   ACS: No.  Non-ACS:    Anginal Classification: CCS III  Anti-ischemic medical therapy: No Therapy  Non-Invasive Test Results: High-risk stress test findings: cardiac mortality >3%/year  Prior CABG: No previous CABG      History and Physical Interval Note:  01/05/2018 7:49 AM  Diana [name]  has presented today for surgery, with the diagnosis of unstable angina  The various methods of treatment have been discussed with the patient and family. After consideration of risks, benefits and other options for treatment, the patient has consented to  Procedure(s): LEFT HEART CATH AND CORONARY ANGIOGRAPHY (N/A) as a surgical intervention .  The patient's history has been reviewed, patient examined, no change in status, stable for surgery.  I have reviewed the patient's chart and labs.  Questions were answered to the patient's satisfaction.     Nicki Guadalajarahomas Sareena Odeh

## 2018-01-05 NOTE — Progress Notes (Signed)
Appreciate Dr. Landry DykeKelly's assistance.  Reviewed films personally. Total occlusion of circ and LAD with severe disease in large ramus. RCA with diffuse disease but not severe.   Having some chest discomfort and tachycardia.  Anxious. EKG shows inferolateral T wave inversions and LVH.   BP elevated.  Will start metoprolol succinate 50 mg and give NTG.  WIll need cardiac surgery consultation.   Diana [name], Jr. MD [facility]

## 2018-01-05 NOTE — Progress Notes (Signed)
ANTICOAGULATION CONSULT NOTE - Initial Consult  Pharmacy Consult for Heparin Indication: chest pain/ACS  Allergies  Allergen Reactions  . Contrast Media [Iodinated Diagnostic Agents] Anaphylaxis    Had CT scan 12/14/17, had trouble breathing, throat swelled  . Penicillins Rash    Has patient had a PCN reaction causing immediate rash, facial/tongue/throat swelling, SOB or lightheadedness with hypotension: yes Has patient had a PCN reaction causing severe rash involving mucus membranes or skin necrosis:  no Has patient had a PCN reaction that required hospitalization:  no Has patient had a PCN reaction occurring within the last 10 years: no If all of the above answers are "NO", then may proceed with Cephalosporin use.   . Codeine Anxiety    Dizziness and headache    Patient Measurements: Height: 5\' 3"  (160 cm) Weight: 132 lb (59.9 kg) IBW/kg (Calculated) : 52.4 Heparin Dosing Weight: 59.9  Vital Signs: Temp: 97.5 F (36.4 C) (08/08 0845) Temp Source: Oral (08/08 0845) BP: 130/78 (08/08 1400) Pulse Rate: 92 (08/08 1427)  Labs: No results for input(s): HGB, HCT, PLT, APTT, LABPROT, INR, HEPARINUNFRC, HEPRLOWMOCWT, CREATININE, CKTOTAL, CKMB, TROPONINI in the last 72 hours.  Estimated Creatinine Clearance: 62.6 mL/min (A) (by C-G formula based on SCr of 0.54 mg/dL (L)).   Medical History: Past Medical History:  Diagnosis Date  . Allergy   . Arthritis    feet  . Bronchitis 10/30/2016  . Carpal tunnel syndrome   . Non-ST elevation (NSTEMI) myocardial infarction (HCC) 10/30/2016  . NSTEMI (non-ST elevated myocardial infarction) (HCC) 10/30/2016    Medications:  Scheduled:  . ALPRAZolam  0.25 mg Oral Once  . aspirin  81 mg Oral Pre-Cath  . [START ON 01/06/2018] aspirin  81 mg Oral Daily  . atorvastatin  80 mg Oral q1800  . metoprolol succinate  50 mg Oral Daily  . metoprolol succinate      . nitroGLYCERIN      . sodium chloride flush  3 mL Intravenous Q12H  . sodium chloride  flush  3 mL Intravenous Q12H   Infusions:  . sodium chloride    . sodium chloride    . sodium chloride 1 mL/kg/hr (01/05/18 0745)    Assessment: Pt presented with unstable angina on 7/30. Options were discussed and she agreed to proceeding with left heart cath and coronary angiography on 8/8. Initiating heparin while awaiting surgical consult for possible CABG. Heparin to begin 8 hours post sheath pull @ 1630.  Baseline Hgb 11.1  Goal of Therapy:  Heparin level 0.3-0.7 units/ml Monitor platelets by anticoagulation protocol: Yes   Plan: Start heparin infusion at 700 units/hr Check anti-Xa level in 6 hours and daily while on heparin Continue to monitor H&H and platelets  Roslyn Smilingraig M Emya Picado, PharmD Candidate 01/05/2018,3:12 PM

## 2018-01-05 NOTE — Progress Notes (Signed)
Patient currently pain free.  Appreciate Dr. Dennie MaizesGerhardt's help. On Beta blocker and statin now. IV heparin to be started.  CABG Tuesday.   Diana [name], Diana [name] 7:03 PM

## 2018-01-05 NOTE — Progress Notes (Signed)
**Note Diana [name]-Identified via Obfuscation** Patient transferred to [address] around 1630. No complaints of chest pain or shortness of breath. Sinus rhythm on the monitor. All personal items sent with patient. Family accompanied patient to inpatient unit. Fall/safety precautions implemented.

## 2018-01-05 NOTE — Progress Notes (Signed)
Patient resting with no complaints of pain.  Heparin drip infusing.

## 2018-01-05 NOTE — Consult Note (Addendum)
[address]       [place] [address]             [phone]        Takyia M Lorge [facility] [place] Medical Record #[id number] Date of Birth: Nov 10, 1955  Referring: No ref. provider found Primary Care: Shawnee Knapp, MD Primary Cardiologist:No primary care provider on file.  Chief Complaint:   Neck and shoulder pain, chest pain  History of Present Illness:       Ms. Milbourn is a 62 year old patient with a past medical history significant for benign essential hypertension, hyperlipidemia, GERD, iron deficiency anemia, and chronic thoracic spine pain who about a year ago had exertional diaphoresis, some coughing with upper respiratory congestion, and some tightness in her chest.  A troponin was drawn and it was abnormal and the patient was to report to the hospital.  Her troponin then dropped and a myocardial perfusion scan showed a fixed apical defect but no ischemia.  Estimated left ejection fraction was read as 44%.  She underwent echocardiogram which showed an inferior wall motion abnormality with an EF of 55%.  She was seen by [facility] heart care at the time and was supposed to have followed up in 2 to 3 weeks and was discharged on nitroglycerin but no other medications.    Recently, she was having some significant neck and shoulder pain that was suggestive of a cervical disc disease.  She also had chest pain, diaphoresis, shortness of breath, nausea, and vomiting as associated symptoms.  Due to her symptoms and previous abnormal troponin a coronary CTA was performed on July 17 which showed severe stenosis or total occlusion of the LAD and circumflex with abnormal FFR and a moderate stenosis involving the ramus.  RCA evidently was nonischemic and had mild disease.  She continued to require intermittent nitroglycerin to relieve her substernal chest pain.  She underwent cardiac catheterization on 01/05/2018 which revealed significant multivessel coronary artery.  She had a estimated left  ventricular ejection fraction of 50 to 55%.  We are being consulted for possible surgical revascularization.  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   Of note the patient has history of anemia, iron deficiency and low white cell count.  She has been evaluated by the oncologist, given IV iron on several occasions, bone marrow biopsy had been recommended but not done.  No current labs since July 18,    Current Activity/ Functional Status: Patient was independent with mobility/ambulation, transfers, ADL's, IADL's.   Zubrod Score: At the time of surgery this patient's most appropriate activity status/level should be described as: _0     0    Normal activity, no symptoms _1     1    Restricted in physical strenuous activity but ambulatory, able to do out light work _2     2    Ambulatory and capable of self care, unable to do work activities, up and about                 more than 50%  Of the time                            _3     3    Only limited self care, in bed greater than 50% of waking hours _4     4    Completely disabled, no self care, confined  to bed or chair _0     5    Moribund  Past Medical History:  Diagnosis Date  . Allergy   . Arthritis    feet  . Bronchitis 10/30/2016  . Carpal tunnel syndrome   . Non-ST elevation (NSTEMI) myocardial infarction ([place]) 10/30/2016  . NSTEMI (non-ST elevated myocardial infarction) ([place]) 10/30/2016    Past Surgical History:  Procedure Laterality Date  . CARPAL TUNNEL RELEASE Left 02/12/2016   Procedure: LEFT CARPAL TUNNEL RELEASE;  Surgeon: Daryll Brod, MD;  Location: [facility];  Service: Orthopedics;  Laterality: Left;  FAB  . TONSILLECTOMY    . TUBAL LIGATION    . WRIST SURGERY Right     Social History   Tobacco Use  Smoking Status Never Smoker  Smokeless Tobacco Never Used    Social History   Substance and Sexual Activity  Alcohol Use No     Allergies  Allergen  Reactions  . Contrast Media [Iodinated Diagnostic Agents] Anaphylaxis    Had CT scan 12/14/17, had trouble breathing, throat swelled  . Penicillins Rash    Has patient had a PCN reaction causing immediate rash, facial/tongue/throat swelling, SOB or lightheadedness with hypotension: yes Has patient had a PCN reaction causing severe rash involving mucus membranes or skin necrosis:  no Has patient had a PCN reaction that required hospitalization:  no Has patient had a PCN reaction occurring within the last 10 years: no If all of the above answers are "NO", then may proceed with Cephalosporin use.   . Codeine Anxiety    Dizziness and headache    Current Facility-Administered Medications  Medication Dose Route Frequency Provider Last Rate Last Dose  . 0.9 %  sodium chloride infusion  250 mL Intravenous PRN Jacolyn Reedy, MD      . 0.9 %  sodium chloride infusion  250 mL Intravenous PRN Troy Sine, MD      . 0.9% sodium chloride infusion  1 mL/kg/hr Intravenous Continuous Jacolyn Reedy, MD 59.4 mL/hr at 01/05/18 0745 1 mL/kg/hr at 01/05/18 0745  . acetaminophen (TYLENOL) tablet 650 mg  650 mg Oral Q4H PRN Troy Sine, MD      . ALPRAZolam Duanne Moron) tablet 0.25 mg  0.25 mg Oral Once Jacolyn Reedy, MD      . aspirin chewable tablet 81 mg  81 mg Oral Irine Seal, MD      . Derrill Memo ON 01/06/2018] aspirin chewable tablet 81 mg  81 mg Oral Daily Troy Sine, MD      . atorvastatin (LIPITOR) tablet 80 mg  80 mg Oral q1800 Troy Sine, MD      . diazepam (VALIUM) tablet 5 mg  5 mg Oral Q4H PRN Troy Sine, MD   5 mg at 01/05/18 0948  . metoprolol succinate (TOPROL-XL) 24 hr tablet 50 mg  50 mg Oral Daily Jacolyn Reedy, MD   50 mg at 01/05/18 1108  . metoprolol succinate (TOPROL-XL) 25 MG 24 hr tablet           . nitroGLYCERIN (NITROSTAT) 0.4 MG SL tablet           . nitroGLYCERIN (NITROSTAT) SL tablet 0.4 mg  0.4 mg Sublingual Q5 min PRN Troy Sine, MD       . ondansetron [facility]) injection 4 mg  4 mg Intravenous Q6H PRN Troy Sine, MD      . sodium chloride flush (NS) 0.9 % injection 3  mL  3 mL Intravenous Q12H Jacolyn Reedy, MD      . sodium chloride flush (NS) 0.9 % injection 3 mL  3 mL Intravenous PRN Jacolyn Reedy, MD      . sodium chloride flush (NS) 0.9 % injection 3 mL  3 mL Intravenous Q12H Shelva Majestic A, MD      . sodium chloride flush (NS) 0.9 % injection 3 mL  3 mL Intravenous PRN Troy Sine, MD        Medications Prior to Admission  Medication Sig Dispense Refill Last Dose  . ALPRAZolam (XANAX) 0.5 MG tablet Take 1/2-1 tab po every 4 hours as needed for anxiety and take 2-4 tabs po qhs prn sleep 20 tablet 0 01/05/2018 at 0600  . aspirin EC 81 MG tablet Take 81 mg by mouth daily.   01/05/2018 at 0330  . atorvastatin (LIPITOR) 40 MG tablet Take 40 mg by mouth daily.   01/04/2018 at Unknown time  . cyclobenzaprine (FLEXERIL) 10 MG tablet Take 0.5-1 tablets (5-10 mg total) by mouth 3 (three) times daily as needed for muscle spasms. 30 tablet 0 01/04/2018 at Unknown time  . diclofenac (VOLTAREN) 75 MG EC tablet Take 1 tablet (75 mg total) by mouth 2 (two) times daily. 30 tablet 0 Past Month at Unknown time  . nitroGLYCERIN (NITROSTAT) 0.4 MG SL tablet DISSOLVE 1 TABLET UNDER THE TONGUE EVERY 5 MINUTES FOR CHEST PAIN 25 tablet 0 01/02/2018  . omeprazole (PRILOSEC) 20 MG capsule Take 1 capsule (20 mg total) by mouth daily. 90 capsule 1 01/05/2018 at 0330  . ondansetron (ZOFRAN-ODT) 8 MG disintegrating tablet Take 0.5-1 tablets (4-8 mg total) by mouth every 8 (eight) hours as needed for nausea. 30 tablet 0 Past Month at Unknown time  . pregabalin (LYRICA) 75 MG capsule Take 1 capsule (75 mg total) by mouth 2 (two) times daily. 60 capsule 1 01/05/2018 at 0600  . butalbital-acetaminophen-caffeine (FIORICET, ESGIC) 50-325-40 MG tablet Take 1-2 tablets by mouth every 6 (six) hours as needed for headache. (Patient not taking: Reported on  12/30/2017) 20 tablet 0 Not Taking  . fluticasone (FLONASE) 50 MCG/ACT nasal spray Place 2 sprays into both nostrils daily. 16 g 2 Taking  . Vitamin D, Ergocalciferol, (DRISDOL) 50000 units CAPS capsule Take 1 capsule (50,000 Units total) every 7 (seven) days by mouth. (Patient not taking: Reported on 12/30/2017) 12 capsule 1 Not Taking    Family History  Problem Relation Age of Onset  . Thyroid disease Mother   . Brain cancer Sister      Review of Systems:   Review of Systems  Constitutional: Positive for diaphoresis. Negative for chills and fever.  HENT: Negative.   Eyes: Positive for double vision. Negative for blurred vision.  Respiratory: Positive for shortness of breath. Negative for cough and wheezing.   Cardiovascular: Positive for chest pain. Negative for leg swelling.  Gastrointestinal: Positive for nausea and vomiting. Negative for diarrhea and heartburn.  Genitourinary: Negative.   Musculoskeletal: Positive for joint pain and neck pain.  Psychiatric/Behavioral: The patient is nervous/anxious.    Pertinent items are noted in HPI.       Physical Exam: BP 130/78   Pulse 92   Temp (!) 97.5 F (36.4 C) (Oral)   Resp 14   Ht _0  (1.6 m)   Wt 59.9 kg   SpO2 100%   BMI 23.38 kg/m    General appearance: alert, cooperative and no distress Resp: clear to auscultation  bilaterally Cardio: regular rate and rhythm, S1, S2 normal, no murmur, click, rub or gallop GI: soft, non-tender; bowel sounds normal; no masses,  no organomegaly Extremities: extremities normal, atraumatic, no cyanosis or edema, good distal pulses and perfusion. Neurologic: Grossly normal  Diagnostic Studies & Laboratory data:     Recent Radiology Findings:   No results found.   Recent Lab Findings: Lab Results  Component Value Date   WBC 3.4 (A) 12/15/2017   HGB 11.1 (A) 12/15/2017   HCT 36.2 (A) 12/15/2017   PLT 156 12/09/2017   GLUCOSE 89 12/15/2017   CHOL 176 10/31/2016   TRIG 49  10/31/2016   HDL 59 10/31/2016   LDLCALC 107 (H) 10/31/2016   ALT 28 12/15/2017   AST 29 12/15/2017   NA 144 12/15/2017   K 4.6 12/15/2017   CL 105 12/15/2017   CREATININE 0.54 (L) 12/15/2017   BUN 16 12/15/2017   CO2 21 12/15/2017   TSH 0.66 01/29/2016   INR 0.99 10/30/2016   HGBA1C 5.7 07/25/2017    CATH: by  Dr Claiborne Billings   Prox RCA to Mid RCA lesion is 25% stenosed.  Ost 1st Mrg lesion is 80% stenosed.  Prox Cx to Mid Cx lesion is 95% stenosed.  Ost LAD to Prox LAD lesion is 80% stenosed.  Ost 1st Diag to 1st Diag lesion is 90% stenosed.  Prox LAD to Mid LAD lesion is 90% stenosed.  Ost 2nd Diag to 2nd Diag lesion is 99% stenosed.  Mid LAD-1 lesion is 99% stenosed with 99% stenosed side branch in Ost 2nd Diag.  Mid LAD-2 lesion is 99% stenosed.  The left ventricular ejection fraction is 50-55% by visual estimate.  The left ventricular systolic function is normal.   Significant multivessel CAD with evidence for coronary calcification.    The LAD is calcified with 80% proximal stenosis, 90% diffuse narrowing in the first diagonal branch, 90% mid LAD stenosis with subtotal 99% stenosis of the mid LAD and second diagonal vessel;  flush and fill phenomena seen to the apical portion of the LAD.  Calcified left circumflex vessel with 80% ostial narrowing in a small high marginal/ramus vessel, and subtotal stenosis of the mid circumflex.  There is irregular plaque in the dominant RCA without high-grade obstructive disease.  There is evidence for septal collaterals arising from the PDA to the left coronary system.  Preserved global LV contractility with an ejection fraction at 50 to 55% without focal segmental wall motion abnormalities.  LVEDP 17 mm.  RECOMMENDATION: Dr. Wynonia Lawman was notified immediately after the catheterization study regarding  the patient's significant catheterization findings.  The patient will be discharged later today.  He will initiate anti-ischemic  medical regimen.  Surgical consultation will be obtained as an outpatient for consideration of possible CABG revascularization surgery if the patient has adequate targets for revascularization.  Recommend Aspirin 16m daily for moderate CAD.  Will  not initiate additional antiplatelet therapy due to potential for imminent CABG.   Most Recent Value  AO Systolic Pressure 1147mmHg  AO Diastolic Pressure 84 mmHg  AO Mean 1829mmHg  LV Systolic Pressure 1562mmHg  LV Diastolic Pressure 11 mmHg  LV EDP 17 mmHg  AOp Systolic Pressure 1130mmHg  AOp Diastolic Pressure 84 mmHg  AOp Mean Pressure 1865mmHg  LVp Systolic Pressure 1784mmHg  LVp Diastolic Pressure 11 mmHg  LVp EDP Pressure 18 mmHg    I have independently reviewed the above  cath films and reviewed the  findings with the  patient .   Assessment / Plan:      1. Unstable angina-peak troponin 0.38.  She is currently chest pain-free and not on any drips.  Continue medical therapy per primary with aspirin, Lipitor, metoprolol XL, and sublingual nitroglycerin as needed.  2.  Hypertension-well-controlled at this time on her current medication regimen 3.  Hyperlipidemia-continue statin therapy 4.  GERD-no current symptoms.  Zofran as needed for nausea 5.  Anxiety-Xanax PRN 6.  Previous low white count and anemia, no family history of sickle cell, patient reports sickle cell testing is negative and her   Plan: Admission per cardiology , to 4E telemetry for continued care.  Plan for coronary bypass grafting  on Tuesday January 10, 2018.  I reviewed with the patient her sister and mother her current anatomic coronary situation, the recommendation to proceed with coronary artery bypass grafting because of total occlusion of the LAD and high-grade complex lesion of the marginal branch.  The distal LAD may not be bypassable.  The risks and options of treatment were discussed with her and I agree with Dr. Claiborne Billings proceeding with coronary artery bypass  grafting offers the best option for relief of symptoms and preservation of myocardial function.  Will obtain follow-up labs in the morning, Currently on heparin   Grace Isaac MD      [address] [place] [address] Office [phone]   [place]

## 2018-01-05 NOTE — Progress Notes (Signed)
Recurrent chest pain again.  Will plan to keep in hospital and askfor surgical consult.  Begin heparin in 8 hours and also IV NTG if recurs.   Darden PalmerW. Spencer Tilley, Jr. MD [facility] CenterFACC 12:18 PM

## 2018-01-06 ENCOUNTER — Inpatient Hospital Stay (HOSPITAL_COMMUNITY): Payer: BLUE CROSS/BLUE SHIELD

## 2018-01-06 ENCOUNTER — Encounter (HOSPITAL_COMMUNITY): Payer: Self-pay | Admitting: Cardiovascular Disease

## 2018-01-06 DIAGNOSIS — Z01810 Encounter for preprocedural cardiovascular examination: Secondary | ICD-10-CM

## 2018-01-06 LAB — CBC WITH DIFFERENTIAL/PLATELET
Abs Immature Granulocytes: 0 10*3/uL (ref 0.0–0.1)
BASOS PCT: 0 %
Basophils Absolute: 0 10*3/uL (ref 0.0–0.1)
EOS ABS: 0 10*3/uL (ref 0.0–0.7)
Eosinophils Relative: 0 %
HCT: 32 % — ABNORMAL LOW (ref 36.0–46.0)
Hemoglobin: 9.9 g/dL — ABNORMAL LOW (ref 12.0–15.0)
Immature Granulocytes: 0 %
LYMPHS ABS: 1.9 10*3/uL (ref 0.7–4.0)
Lymphocytes Relative: 18 %
MCH: 25.3 pg — ABNORMAL LOW (ref 26.0–34.0)
MCHC: 30.9 g/dL (ref 30.0–36.0)
MCV: 81.6 fL (ref 78.0–100.0)
Monocytes Absolute: 0.9 10*3/uL (ref 0.1–1.0)
Monocytes Relative: 9 %
NEUTROS PCT: 73 %
Neutro Abs: 7.6 10*3/uL (ref 1.7–7.7)
PLATELETS: 188 10*3/uL (ref 150–400)
RBC: 3.92 MIL/uL (ref 3.87–5.11)
RDW: 13.9 % (ref 11.5–15.5)
WBC: 10.5 10*3/uL (ref 4.0–10.5)

## 2018-01-06 LAB — PULMONARY FUNCTION TEST
DL/VA % pred: 128 %
DL/VA: 6.01 ml/min/mmHg/L
DLCO cor % pred: 73 %
DLCO cor: 16.77 ml/min/mmHg
DLCO unc % pred: 63 %
DLCO unc: 14.64 ml/min/mmHg
FEF 25-75 Post: 2.56 L/sec
FEF 25-75 Pre: 1.91 L/sec
FEF2575-%Change-Post: 34 %
FEF2575-%Pred-Post: 128 %
FEF2575-%Pred-Pre: 95 %
FEV1-%Change-Post: 3 %
FEV1-%Pred-Post: 79 %
FEV1-%Pred-Pre: 76 %
FEV1-Post: 1.59 L
FEV1-Pre: 1.54 L
FEV1FVC-%Change-Post: 4 %
FEV1FVC-%Pred-Pre: 110 %
FEV6-%Change-Post: -1 %
FEV6-%Pred-Post: 71 %
FEV6-%Pred-Pre: 72 %
FEV6-Post: 1.75 L
FEV6-Pre: 1.77 L
FEV6FVC-%Pred-Post: 104 %
FEV6FVC-%Pred-Pre: 104 %
FVC-%Change-Post: -1 %
FVC-%Pred-Post: 68 %
FVC-%Pred-Pre: 69 %
FVC-Post: 1.75 L
FVC-Pre: 1.77 L
Post FEV1/FVC ratio: 91 %
Post FEV6/FVC ratio: 100 %
Pre FEV1/FVC ratio: 87 %
Pre FEV6/FVC Ratio: 100 %
RV % pred: 83 %
RV: 1.62 L
TLC % pred: 64 %
TLC: 3.16 L

## 2018-01-06 LAB — HEPARIN LEVEL (UNFRACTIONATED)
Heparin Unfractionated: 0.29 IU/mL — ABNORMAL LOW (ref 0.30–0.70)
Heparin Unfractionated: 0.55 IU/mL (ref 0.30–0.70)

## 2018-01-06 LAB — COMPREHENSIVE METABOLIC PANEL
ALT: 22 U/L (ref 0–44)
AST: 22 U/L (ref 15–41)
Albumin: 3.4 g/dL — ABNORMAL LOW (ref 3.5–5.0)
Alkaline Phosphatase: 61 U/L (ref 38–126)
Anion gap: 10 (ref 5–15)
BUN: 15 mg/dL (ref 6–20)
CO2: 21 mmol/L — ABNORMAL LOW (ref 22–32)
Calcium: 9.3 mg/dL (ref 8.9–10.3)
Chloride: 109 mmol/L (ref 98–111)
Creatinine, Ser: 0.77 mg/dL (ref 0.44–1.00)
GFR calc Af Amer: >60 mL/min (ref >60)
GFR calc non Af Amer: >60 mL/min (ref >60)
Glucose, Bld: 108 mg/dL — ABNORMAL HIGH (ref 70–99)
Potassium: 3.7 mmol/L (ref 3.5–5.1)
Sodium: 140 mmol/L (ref 135–145)
Total Bilirubin: 0.3 mg/dL (ref 0.3–1.2)
Total Protein: 6.2 g/dL — ABNORMAL LOW (ref 6.5–8.1)

## 2018-01-06 LAB — PROTIME-INR
INR: 0.99
Prothrombin Time: 13 seconds (ref 11.4–15.2)

## 2018-01-06 LAB — HIV ANTIBODY (ROUTINE TESTING W REFLEX): HIV Screen 4th Generation wRfx: NONREACTIVE

## 2018-01-06 LAB — ECHOCARDIOGRAM COMPLETE
Height: 63 in
Weight: 2134.4 oz

## 2018-01-06 MED ORDER — ALBUTEROL SULFATE (2.5 MG/3ML) 0.083% IN NEBU
2.5000 mg | INHALATION_SOLUTION | Freq: Once | RESPIRATORY_TRACT | Status: AC
  Administered 2018-01-06: 2.5 mg via RESPIRATORY_TRACT

## 2018-01-06 NOTE — Progress Notes (Signed)
Subjective:  Today's her 62th birthday.  She is currently pain-free without recurrence of chest pain and no shortness of breath.  Objective:  Vital Signs in the last 24 hours: BP 122/75 (BP Location: Left Arm)   Pulse 79   Temp 97.8 F (36.6 C) (Oral)   Resp 16   Ht 5\' 3"  (1.6 m)   Wt 60.5 kg   SpO2 100%   BMI 23.63 kg/m   Physical Exam: Pleasant friendly black female in no acute distress Lungs:  Clear Cardiac:  Regular rhythm, normal S1 and S2, no S3 Abdomen:  Soft, nontender, no masses Extremities:  No edema present, radial catheterization site is clean and dry without hematoma  Intake/Output from previous day: 08/08 0701 - 08/09 0700 In: 260.3 [P.O.:250; I.V.:10.3] Out: -   Weight Filed Weights   01/05/18 0538 01/06/18 0607  Weight: 59.9 kg 60.5 kg    Lab Results: Basic Metabolic Panel: Recent Labs    01/06/18 0036  NA 140  K 3.7  CL 109  CO2 21*  GLUCOSE 108*  BUN 15  CREATININE 0.77   CBC: Recent Labs    01/06/18 0036  WBC 10.5  NEUTROABS 7.6  HGB 9.9*  HCT 32.0*  MCV 81.6  PLT 188    Telemetry: Sinus rhythm  Assessment/Plan:  1.  Unstable angina 2.  Severe coronary artery disease awaiting surgery 3.  Hypertension 4.  Hyperlipidemia 5.  Iron deficiency anemia  Recommendations:  Continue heparin over the weekend.  Awaiting surgery next week.  Echo pending.      Darden PalmerW. Spencer Tilley, Jr.  MD [facility] Center[facility]ACC Cardiology  01/06/2018, 1:28 PM

## 2018-01-06 NOTE — Progress Notes (Signed)
Patient had spontaneous bleeding from radial site. Pressure held and bleeding stopped. Dressing changed. Pulse present; fingers cool to touch but equal to other hand.   Thanks, Barrington Ellisonlivia Alondra Vandeven, RN

## 2018-01-06 NOTE — Progress Notes (Signed)
ANTICOAGULATION CONSULT NOTE - Follow Up Consult  Pharmacy Consult for heparin Indication: CAD awaiting CABG  Labs: Recent Labs    01/06/18 0036  HEPARINUNFRC 0.29*    Assessment: 62yo female slightly subtherapeutic on heparin with initial dosing post-cath.  Goal of Therapy:  Heparin level 0.3-0.7 units/ml   Plan:  Will increase heparin gtt by 1 unit/kg/hr to 750 units/hr and check level in 6 hours.    Diana [name], PharmD, BCPS  01/06/2018,1:49 AM

## 2018-01-06 NOTE — Progress Notes (Addendum)
Pre-op Cardiac Surgery  Carotid Findings:  ICA: 1-39% bilateral  Upper Extremity Right Left  Brachial Pressures 136 134  Radial Waveforms triphasic triphasic  Ulnar Waveforms triphasic triphasic  Pal[place] Arch (Allen's Test) Radial: wnl Ulnar: decrease 50% w/ comp Radial: wnl Ulnar: unl   ABI completed: 08/19/17 RT ABI: 1.29 LT ABI: 1.17  Delaynee Alred 01/06/2018 1:03 PM

## 2018-01-06 NOTE — Progress Notes (Signed)
ANTICOAGULATION CONSULT NOTE - Follow Up Consult  Pharmacy Consult for Heparin Indication: CAD  Allergies  Allergen Reactions  . Contrast Media [Iodinated Diagnostic Agents] Anaphylaxis    Had CT scan 12/14/17, had trouble breathing, throat swelled  . Penicillins Rash    Has patient had a PCN reaction causing immediate rash, facial/tongue/throat swelling, SOB or lightheadedness with hypotension: yes Has patient had a PCN reaction causing severe rash involving mucus membranes or skin necrosis:  no Has patient had a PCN reaction that required hospitalization:  no Has patient had a PCN reaction occurring within the last 10 years: no If all of the above answers are "NO", then may proceed with Cephalosporin use.   . Codeine Anxiety    Dizziness and headache    Patient Measurements: Height: 5\' 3"  (160 cm) Weight: 133 lb 6.4 oz (60.5 kg) IBW/kg (Calculated) : 52.4 Heparin Dosing Weight: 59.9 kg  Vital Signs: Temp: 97.8 F (36.6 C) (08/09 0848) Temp Source: Oral (08/09 0848) BP: 123/80 (08/09 0900) Pulse Rate: 67 (08/09 0848)  Labs: Recent Labs    01/06/18 0036 01/06/18 0721  HGB 9.9*  --   HCT 32.0*  --   PLT 188  --   LABPROT  --  13.0  INR  --  0.99  HEPARINUNFRC 0.29* 0.55  CREATININE 0.77  --     Estimated Creatinine Clearance: 61.9 mL/min (by C-G formula based on SCr of 0.77 mg/dL).   Medications:  Scheduled:  . ALPRAZolam  0.25 mg Oral Once  . aspirin  81 mg Oral Daily  . atorvastatin  80 mg Oral q1800  . diclofenac  75 mg Oral BID  . metoprolol succinate  50 mg Oral Daily  . pantoprazole  40 mg Oral Daily  . pregabalin  75 mg Oral BID  . sodium chloride flush  3 mL Intravenous Q12H   Infusions:  . sodium chloride Stopped (01/05/18 1835)  . heparin 750 Units/hr (01/06/18 0200)    Assessment: 62 yo F found to have multivessel disease on cardiac cath.  Noted plans for CABG 8/13.  Heparin is therapeutic on 750 units/hr.  Goal of Therapy:  Heparin  level 0.3-0.7 units/ml Monitor platelets by anticoagulation protocol: Yes   Plan:  Continue heparin at 750 units/hr Daily heparin level and CBC  Terrie Haring, Pharm.D., BCPS Clinical Pharmacist Clinical phone for 01/06/2018 from 8:30-4:00 is [phone].  **Pharmacist phone directory can now be found on [email or web address] ([email or web address]).  Listed under [facility].  01/06/2018 10:50 AM

## 2018-01-06 NOTE — Progress Notes (Signed)
Happy 62th birthday to patient!!! Brought AD material to patient and she wants time to look over it with her mother before filling it out. Likely to do it on Monday.  Patient in great spirits and is great health except for needing this surgery which is expected to benefit her and clear blockages.  Provided calm presence and listened to her needs and had prayer for her  As she prepares for this surgery. Phebe CollaDonna S Suhaas Agena, Chaplain \   01/06/18 1200  Clinical Encounter Type  Visited With Patient  Visit Type Initial;Spiritual support;Pre-op  Referral From Physician  Consult/Referral To Chaplain  Spiritual Encounters  Spiritual Needs Prayer  Stress Factors  Patient Stress Factors Other (Comment) (preparing for surgery)

## 2018-01-07 DIAGNOSIS — I200 Unstable angina: Secondary | ICD-10-CM

## 2018-01-07 DIAGNOSIS — I2510 Atherosclerotic heart disease of native coronary artery without angina pectoris: Secondary | ICD-10-CM

## 2018-01-07 LAB — CBC WITH DIFFERENTIAL/PLATELET
Abs Immature Granulocytes: 0 10*3/uL (ref 0.0–0.1)
BASOS ABS: 0 10*3/uL (ref 0.0–0.1)
BASOS PCT: 0 %
EOS ABS: 0.1 10*3/uL (ref 0.0–0.7)
Eosinophils Relative: 2 %
HCT: 32.9 % — ABNORMAL LOW (ref 36.0–46.0)
Hemoglobin: 10.2 g/dL — ABNORMAL LOW (ref 12.0–15.0)
Immature Granulocytes: 0 %
Lymphocytes Relative: 44 %
Lymphs Abs: 3.3 10*3/uL (ref 0.7–4.0)
MCH: 25.2 pg — ABNORMAL LOW (ref 26.0–34.0)
MCHC: 31 g/dL (ref 30.0–36.0)
MCV: 81.4 fL (ref 78.0–100.0)
Monocytes Absolute: 0.5 10*3/uL (ref 0.1–1.0)
Monocytes Relative: 6 %
NEUTROS PCT: 48 %
Neutro Abs: 3.5 10*3/uL (ref 1.7–7.7)
Platelets: 184 10*3/uL (ref 150–400)
RBC: 4.04 MIL/uL (ref 3.87–5.11)
RDW: 14 % (ref 11.5–15.5)
WBC: 7.4 10*3/uL (ref 4.0–10.5)

## 2018-01-07 LAB — HEPARIN LEVEL (UNFRACTIONATED): HEPARIN UNFRACTIONATED: 0.62 [IU]/mL (ref 0.30–0.70)

## 2018-01-07 NOTE — Progress Notes (Signed)
CARDIAC REHAB PHASE I   Talked with pt. Pt bored and anxious for surgery. Pt c/o neck and shoulder pain with movement. Pt encouraged to take it easy and listen to her symptoms. Offered to educate pt on upocoming surgery, pt would like to wait until mother is at bedside. IS, [facility] care guide and cardiac surgery booklet left with pt. Will follow up as able later today v Monday afternoon as able.  [phone] Reynold Boweneresa  Ayaat Jansma, RN BSN 01/07/2018 10:08 AM

## 2018-01-07 NOTE — Progress Notes (Signed)
   Progress Note  Patient Name: Diana [name] Date of Encounter: 01/07/2018  Primary Cardiologist: Dr Donnie Ahoilley  Subjective   No chest pain or dyspnea  Inpatient Medications    Scheduled Meds: . ALPRAZolam  0.25 mg Oral Once  . aspirin  81 mg Oral Daily  . atorvastatin  80 mg Oral q1800  . metoprolol succinate  50 mg Oral Daily  . pantoprazole  40 mg Oral Daily  . pregabalin  75 mg Oral BID  . sodium chloride flush  3 mL Intravenous Q12H   Continuous Infusions: . sodium chloride Stopped (01/05/18 1835)  . heparin 750 Units/hr (01/06/18 2229)   PRN Meds: sodium chloride, acetaminophen, ALPRAZolam, cyclobenzaprine, diazepam, nitroGLYCERIN, ondansetron (ZOFRAN) IV, ondansetron, sodium chloride flush   Vital Signs    Vitals:   01/06/18 1213 01/06/18 1606 01/06/18 2008 01/07/18 0448  BP: 122/75 120/64 (!) 153/84 115/65  Pulse: 79 67 72 67  Resp:  16 20 19   Temp: 97.8 F (36.6 C) 97.8 F (36.6 C) 98.3 F (36.8 C) 98 F (36.7 C)  TempSrc: Oral Oral Oral Oral  SpO2: 100% 100% 100% 99%  Weight:    60.6 kg  Height:        Intake/Output Summary (Last 24 hours) at 01/07/2018 0950 Last data filed at 01/07/2018 0646 Gross per 24 hour  Intake 570 ml  Output -  Net 570 ml   Filed Weights   01/05/18 0538 01/06/18 0607 01/07/18 0448  Weight: 59.9 kg 60.5 kg 60.6 kg    Telemetry    Sinus - Personally Reviewed  Physical Exam   GEN: No acute distress.   Neck: No JVD Cardiac: RRR, no murmurs, rubs, or gallops.  Respiratory: Clear to auscultation bilaterally. GI: Soft, nontender, non-distended  MS: No edema; radial cath site with no hematoma. Neuro:  Nonfocal  Psych: Normal affect   Labs    Chemistry Recent Labs  Lab 01/06/18 0036  NA 140  K 3.7  CL 109  CO2 21*  GLUCOSE 108*  BUN 15  CREATININE 0.77  CALCIUM 9.3  PROT 6.2*  ALBUMIN 3.4*  AST 22  ALT 22  ALKPHOS 61  BILITOT 0.3  GFRNONAA >60  GFRAA >60  ANIONGAP 10     Hematology Recent Labs    Lab 01/06/18 0036 01/07/18 0310  WBC 10.5 7.4  RBC 3.92 4.04  HGB 9.9* 10.2*  HCT 32.0* 32.9*  MCV 81.6 81.4  MCH 25.3* 25.2*  MCHC 30.9 31.0  RDW 13.9 14.0  PLT 188 184    Patient Profile     62 y.o. female with hypertension, hyperlipidemia status post catheterization revealing severe coronary disease awaiting surgery.  Assessment & Plan    1 coronary artery disease-patient admitted with unstable angina.  Severe coronary disease noted.  She is scheduled for coronary artery bypass and graft on August 13.  Continue aspirin, heparin metoprolol and statin.  2 hypertension-blood pressure is controlled.  Continue present medications.  3 hyperlipidemia-continue statin.   For questions or updates, please contact [facility] Please consult [email or web address] for contact info under Cardiology/STEMI.      Signed, Olga MillersBrian Jamieon Lannen, MD  01/07/2018, 9:50 AM

## 2018-01-07 NOTE — Progress Notes (Signed)
ANTICOAGULATION CONSULT NOTE - Follow Up Consult  Pharmacy Consult for Heparin Indication: CAD  Allergies  Allergen Reactions  . Contrast Media [Iodinated Diagnostic Agents] Anaphylaxis    Had CT scan 12/14/17, had trouble breathing, throat swelled  . Penicillins Rash    Has patient had a PCN reaction causing immediate rash, facial/tongue/throat swelling, SOB or lightheadedness with hypotension: yes Has patient had a PCN reaction causing severe rash involving mucus membranes or skin necrosis:  no Has patient had a PCN reaction that required hospitalization:  no Has patient had a PCN reaction occurring within the last 10 years: no If all of the above answers are "NO", then may proceed with Cephalosporin use.   . Codeine Anxiety    Dizziness and headache    Patient Measurements: Height: 5\' 3"  (160 cm) Weight: 133 lb 9.6 oz (60.6 kg) IBW/kg (Calculated) : 52.4 Heparin Dosing Weight: 59.9 kg  Vital Signs: Temp: 98 F (36.7 C) (08/10 0448) Temp Source: Oral (08/10 0448) BP: 115/65 (08/10 0448) Pulse Rate: 67 (08/10 0448)  Labs: Recent Labs    01/06/18 0036 01/06/18 0721 01/07/18 0310  HGB 9.9*  --  10.2*  HCT 32.0*  --  32.9*  PLT 188  --  184  LABPROT  --  13.0  --   INR  --  0.99  --   HEPARINUNFRC 0.29* 0.55 0.62  CREATININE 0.77  --   --     Estimated Creatinine Clearance: 61.9 mL/min (by C-G formula based on SCr of 0.77 mg/dL).   Medications:  Scheduled:  . ALPRAZolam  0.25 mg Oral Once  . aspirin  81 mg Oral Daily  . atorvastatin  80 mg Oral q1800  . metoprolol succinate  50 mg Oral Daily  . pantoprazole  40 mg Oral Daily  . pregabalin  75 mg Oral BID  . sodium chloride flush  3 mL Intravenous Q12H   Infusions:  . sodium chloride Stopped (01/05/18 1835)  . heparin 750 Units/hr (01/06/18 2229)    Assessment: 62 yo F found to have multivessel disease on cardiac cath.  Noted plans for CABG 8/13.  Heparin remains therapeutic on 750 units/hr.  Goal of  Therapy:  Heparin level 0.3-0.7 units/ml Monitor platelets by anticoagulation protocol: Yes   Plan:  Continue heparin at 750 units/hr Daily heparin level and CBC  Daina Cara, Pharm.D., BCPS Clinical Pharmacist Clinical phone for 01/07/2018 from 8:30-4:00 is [phone].  **Pharmacist phone directory can now be found on [email or web address] ([email or web address]).  Listed under [facility].  01/07/2018 10:31 AM

## 2018-01-07 NOTE — Progress Notes (Signed)
   01/07/18 1800  Clinical Encounter Type  Visited With Patient;Patient and family together  Visit Type Follow-up  Referral From Nurse  Consult/Referral To Chaplain  Spiritual Encounters  Spiritual Needs Brochure;Prayer    Pt was very delighted and receptive when I arrived. Pt's family available. Chaplain and Pt had a delightful and meaningful conversations. Chaplain to complete the POA papers first thing on Monday.  Blayn Whetsell a Water quality scientistMusiko-Holley, E. I. du PontChaplain

## 2018-01-08 DIAGNOSIS — I25759 Atherosclerosis of native coronary artery of transplanted heart with unspecified angina pectoris: Secondary | ICD-10-CM

## 2018-01-08 LAB — CBC WITH DIFFERENTIAL/PLATELET
Abs Immature Granulocytes: 0 10*3/uL (ref 0.0–0.1)
Basophils Absolute: 0 10*3/uL (ref 0.0–0.1)
Basophils Relative: 0 %
EOS ABS: 0.2 10*3/uL (ref 0.0–0.7)
EOS PCT: 2 %
HEMATOCRIT: 32.6 % — AB (ref 36.0–46.0)
HEMOGLOBIN: 10.2 g/dL — AB (ref 12.0–15.0)
Immature Granulocytes: 0 %
LYMPHS ABS: 2.9 10*3/uL (ref 0.7–4.0)
LYMPHS PCT: 42 %
MCH: 25.1 pg — AB (ref 26.0–34.0)
MCHC: 31.3 g/dL (ref 30.0–36.0)
MCV: 80.3 fL (ref 78.0–100.0)
MONO ABS: 0.6 10*3/uL (ref 0.1–1.0)
Monocytes Relative: 8 %
Neutro Abs: 3.2 10*3/uL (ref 1.7–7.7)
Neutrophils Relative %: 46 %
Platelets: 190 10*3/uL (ref 150–400)
RBC: 4.06 MIL/uL (ref 3.87–5.11)
RDW: 13.6 % (ref 11.5–15.5)
WBC: 6.9 10*3/uL (ref 4.0–10.5)

## 2018-01-08 LAB — HEPARIN LEVEL (UNFRACTIONATED): Heparin Unfractionated: 0.69 IU/mL (ref 0.30–0.70)

## 2018-01-08 MED ORDER — PANTOPRAZOLE SODIUM 40 MG PO TBEC
40.0000 mg | DELAYED_RELEASE_TABLET | Freq: Every day | ORAL | Status: DC
  Administered 2018-01-09: 40 mg via ORAL
  Filled 2018-01-08: qty 1

## 2018-01-08 NOTE — Progress Notes (Signed)
ANTICOAGULATION CONSULT NOTE - Follow Up Consult  Pharmacy Consult for Heparin Indication: CAD  Allergies  Allergen Reactions  . Contrast Media [Iodinated Diagnostic Agents] Anaphylaxis    Had CT scan 12/14/17, had trouble breathing, throat swelled  . Penicillins Rash    Has patient had a PCN reaction causing immediate rash, facial/tongue/throat swelling, SOB or lightheadedness with hypotension: yes Has patient had a PCN reaction causing severe rash involving mucus membranes or skin necrosis:  no Has patient had a PCN reaction that required hospitalization:  no Has patient had a PCN reaction occurring within the last 10 years: no If all of the above answers are "NO", then may proceed with Cephalosporin use.   . Codeine Anxiety    Dizziness and headache    Patient Measurements: Height: 5\' 3"  (160 cm) Weight: 131 lb 9.6 oz (59.7 kg) IBW/kg (Calculated) : 52.4 Heparin Dosing Weight: 59.9 kg  Vital Signs: Temp: 97.7 F (36.5 C) (08/11 0434) Temp Source: Oral (08/11 0434) BP: 137/74 (08/11 0434) Pulse Rate: 62 (08/11 0434)  Labs: Recent Labs    01/06/18 0036 01/06/18 0721 01/07/18 0310 01/08/18 0417  HGB 9.9*  --  10.2* 10.2*  HCT 32.0*  --  32.9* 32.6*  PLT 188  --  184 190  LABPROT  --  13.0  --   --   INR  --  0.99  --   --   HEPARINUNFRC 0.29* 0.55 0.62 0.69  CREATININE 0.77  --   --   --     Estimated Creatinine Clearance: 61.9 mL/min (by C-G formula based on SCr of 0.77 mg/dL).   Medications:  Scheduled:  . ALPRAZolam  0.25 mg Oral Once  . aspirin  81 mg Oral Daily  . atorvastatin  80 mg Oral q1800  . metoprolol succinate  50 mg Oral Daily  . [START ON 01/09/2018] pantoprazole  40 mg Oral Daily  . pregabalin  75 mg Oral BID  . sodium chloride flush  3 mL Intravenous Q12H   Infusions:  . sodium chloride Stopped (01/05/18 1835)  . heparin 750 Units/hr (01/08/18 0445)    Assessment: 62 yo F found to have multivessel disease on cardiac cath.  Noted plans  for CABG 8/13.  Heparin remains therapeutic on 750 units/hr but level is trending up.  Will reduce rate slightly.  Goal of Therapy:  Heparin level 0.3-0.7 units/ml Monitor platelets by anticoagulation protocol: Yes   Plan:  Decrease heparin to 700 units/hr Daily heparin level and CBC  Naif Alabi, Pharm.D., BCPS Clinical Pharmacist Clinical phone for 01/08/2018 from 8:30-4:00 is [phone].  **Pharmacist phone directory can now be found on [email or web address] ([email or web address]).  [facility].  01/08/2018 12:03 PM

## 2018-01-08 NOTE — Progress Notes (Signed)
   Progress Note  Patient Name: Diana [name] Date of Encounter: 01/08/2018  Primary Cardiologist: Dr Donnie Ahoilley  Subjective   Pt denies chest pain or dyspnea  Inpatient Medications    Scheduled Meds: . ALPRAZolam  0.25 mg Oral Once  . aspirin  81 mg Oral Daily  . atorvastatin  80 mg Oral q1800  . metoprolol succinate  50 mg Oral Daily  . [START ON 01/09/2018] pantoprazole  40 mg Oral Daily  . pregabalin  75 mg Oral BID  . sodium chloride flush  3 mL Intravenous Q12H   Continuous Infusions: . sodium chloride Stopped (01/05/18 1835)  . heparin 750 Units/hr (01/08/18 0445)   PRN Meds: sodium chloride, acetaminophen, ALPRAZolam, cyclobenzaprine, diazepam, nitroGLYCERIN, ondansetron (ZOFRAN) IV, ondansetron, sodium chloride flush   Vital Signs    Vitals:   01/07/18 1422 01/07/18 2034 01/07/18 2037 01/08/18 0434  BP: 138/84  (!) 155/85 137/74  Pulse: 70 87 70 62  Resp: 16  20 20   Temp: 98.2 F (36.8 C)  97.9 F (36.6 C) 97.7 F (36.5 C)  TempSrc: Oral  Oral Oral  SpO2: 100%  100% 100%  Weight:    59.7 kg  Height:        Intake/Output Summary (Last 24 hours) at 01/08/2018 1046 Last data filed at 01/07/2018 1815 Gross per 24 hour  Intake 864.25 ml  Output -  Net 864.25 ml   Filed Weights   01/06/18 0607 01/07/18 0448 01/08/18 0434  Weight: 60.5 kg 60.6 kg 59.7 kg    Telemetry    Sinus - Personally Reviewed  Physical Exam   GEN: No acute distress. WD/WN   Neck: No JVD, supple Cardiac: RRR Respiratory: Clear to auscultation bilaterally; no wheeze GI: Soft, NT/ND MS: No edema Neuro:  Grossly intact   Labs    Chemistry Recent Labs  Lab 01/06/18 0036  NA 140  K 3.7  CL 109  CO2 21*  GLUCOSE 108*  BUN 15  CREATININE 0.77  CALCIUM 9.3  PROT 6.2*  ALBUMIN 3.4*  AST 22  ALT 22  ALKPHOS 61  BILITOT 0.3  GFRNONAA >60  GFRAA >60  ANIONGAP 10     Hematology Recent Labs  Lab 01/06/18 0036 01/07/18 0310 01/08/18 0417  WBC 10.5 7.4 6.9  RBC  3.92 4.04 4.06  HGB 9.9* 10.2* 10.2*  HCT 32.0* 32.9* 32.6*  MCV 81.6 81.4 80.3  MCH 25.3* 25.2* 25.1*  MCHC 30.9 31.0 31.3  RDW 13.9 14.0 13.6  PLT 188 184 190    Patient Profile     62 y.o. female with hypertension, hyperlipidemia status post catheterization revealing severe coronary disease awaiting surgery.  Assessment & Plan    1 coronary artery disease-Severe coronary disease noted at time of cath.  For CABG August 13.  Continue aspirin, heparin, metoprolol and statin.  2 hypertension-blood pressure is controlled.  Continue present dose of metoprolol.  3 hyperlipidemia-continue lipitor.   For questions or updates, please contact [facility] Please consult [email or web address] for contact info under Cardiology/STEMI.      Signed, Olga MillersBrian Crenshaw, MD  01/08/2018, 10:46 AM

## 2018-01-09 ENCOUNTER — Telehealth: Payer: Self-pay | Admitting: Family Medicine

## 2018-01-09 ENCOUNTER — Encounter (HOSPITAL_COMMUNITY): Payer: Self-pay | Admitting: Anesthesiology

## 2018-01-09 ENCOUNTER — Inpatient Hospital Stay (HOSPITAL_COMMUNITY): Payer: BLUE CROSS/BLUE SHIELD

## 2018-01-09 DIAGNOSIS — E785 Hyperlipidemia, unspecified: Secondary | ICD-10-CM

## 2018-01-09 LAB — CBC WITH DIFFERENTIAL/PLATELET
ABS IMMATURE GRANULOCYTES: 0 10*3/uL (ref 0.0–0.1)
BASOS ABS: 0 10*3/uL (ref 0.0–0.1)
BASOS PCT: 0 %
Eosinophils Absolute: 0.2 10*3/uL (ref 0.0–0.7)
Eosinophils Relative: 3 %
HCT: 31.6 % — ABNORMAL LOW (ref 36.0–46.0)
HEMOGLOBIN: 9.8 g/dL — AB (ref 12.0–15.0)
Immature Granulocytes: 1 %
LYMPHS PCT: 37 %
Lymphs Abs: 1.9 10*3/uL (ref 0.7–4.0)
MCH: 25.2 pg — ABNORMAL LOW (ref 26.0–34.0)
MCHC: 31 g/dL (ref 30.0–36.0)
MCV: 81.2 fL (ref 78.0–100.0)
MONOS PCT: 11 %
Monocytes Absolute: 0.5 10*3/uL (ref 0.1–1.0)
NEUTROS ABS: 2.4 10*3/uL (ref 1.7–7.7)
NEUTROS PCT: 48 %
PLATELETS: 176 10*3/uL (ref 150–400)
RBC: 3.89 MIL/uL (ref 3.87–5.11)
RDW: 13.8 % (ref 11.5–15.5)
WBC: 5.1 10*3/uL (ref 4.0–10.5)

## 2018-01-09 LAB — BASIC METABOLIC PANEL
ANION GAP: 7 (ref 5–15)
BUN: 16 mg/dL (ref 6–20)
CALCIUM: 8.8 mg/dL — AB (ref 8.9–10.3)
CO2: 28 mmol/L (ref 22–32)
Chloride: 104 mmol/L (ref 98–111)
Creatinine, Ser: 0.6 mg/dL (ref 0.44–1.00)
Glucose, Bld: 109 mg/dL — ABNORMAL HIGH (ref 70–99)
POTASSIUM: 3.6 mmol/L (ref 3.5–5.1)
Sodium: 139 mmol/L (ref 135–145)

## 2018-01-09 LAB — URINALYSIS, ROUTINE W REFLEX MICROSCOPIC
Bacteria, UA: NONE SEEN
Bilirubin Urine: NEGATIVE
Glucose, UA: NEGATIVE mg/dL
Ketones, ur: NEGATIVE mg/dL
Nitrite: NEGATIVE
Protein, ur: NEGATIVE mg/dL
Specific Gravity, Urine: 1.014 (ref 1.005–1.030)
pH: 6 (ref 5.0–8.0)

## 2018-01-09 LAB — SURGICAL PCR SCREEN
MRSA, PCR: NEGATIVE
Staphylococcus aureus: NEGATIVE

## 2018-01-09 LAB — HEMOGLOBIN A1C
Hgb A1c MFr Bld: 5.6 % (ref 4.8–5.6)
Mean Plasma Glucose: 114.02 mg/dL

## 2018-01-09 LAB — BLOOD GAS, ARTERIAL
Acid-Base Excess: 1.4 mmol/L (ref 0.0–2.0)
Bicarbonate: 25.2 mmol/L (ref 20.0–28.0)
Drawn by: 448981
O2 Saturation: 97 %
Patient temperature: 98.6
pCO2 arterial: 37.7 mmHg (ref 32.0–48.0)
pH, Arterial: 7.439 (ref 7.350–7.450)
pO2, Arterial: 85.4 mmHg (ref 83.0–108.0)

## 2018-01-09 LAB — PREPARE RBC (CROSSMATCH)

## 2018-01-09 LAB — ABO/RH: ABO/RH(D): O POS

## 2018-01-09 LAB — APTT: aPTT: 77 seconds — ABNORMAL HIGH (ref 24–36)

## 2018-01-09 LAB — HEPARIN LEVEL (UNFRACTIONATED): HEPARIN UNFRACTIONATED: 0.39 [IU]/mL (ref 0.30–0.70)

## 2018-01-09 MED ORDER — CHLORHEXIDINE GLUCONATE CLOTH 2 % EX PADS
6.0000 | MEDICATED_PAD | Freq: Once | CUTANEOUS | Status: AC
  Administered 2018-01-09: 6 via TOPICAL

## 2018-01-09 MED ORDER — MILRINONE LACTATE IN DEXTROSE 20-5 MG/100ML-% IV SOLN
0.1250 ug/kg/min | INTRAVENOUS | Status: AC
  Administered 2018-01-10: .3 ug/kg/min via INTRAVENOUS
  Filled 2018-01-09: qty 100

## 2018-01-09 MED ORDER — SODIUM CHLORIDE 0.9 % IV SOLN
INTRAVENOUS | Status: AC
  Administered 2018-01-10: 1 [IU]/h via INTRAVENOUS
  Filled 2018-01-09: qty 1

## 2018-01-09 MED ORDER — TEMAZEPAM 15 MG PO CAPS
15.0000 mg | ORAL_CAPSULE | Freq: Once | ORAL | Status: AC | PRN
  Administered 2018-01-10: 15 mg via ORAL
  Filled 2018-01-09: qty 1

## 2018-01-09 MED ORDER — DEXTROSE 5 % IV SOLN
0.0000 ug/min | INTRAVENOUS | Status: DC
  Filled 2018-01-09: qty 4

## 2018-01-09 MED ORDER — MUPIROCIN 2 % EX OINT
1.0000 "application " | TOPICAL_OINTMENT | Freq: Two times a day (BID) | CUTANEOUS | Status: DC
  Administered 2018-01-10: 1 via NASAL
  Filled 2018-01-09 (×2): qty 22

## 2018-01-09 MED ORDER — SODIUM CHLORIDE 0.9 % IV SOLN
INTRAVENOUS | Status: DC
  Filled 2018-01-09: qty 30

## 2018-01-09 MED ORDER — POTASSIUM CHLORIDE 2 MEQ/ML IV SOLN
80.0000 meq | INTRAVENOUS | Status: DC
  Filled 2018-01-09: qty 40

## 2018-01-09 MED ORDER — TRANEXAMIC ACID (OHS) BOLUS VIA INFUSION
15.0000 mg/kg | INTRAVENOUS | Status: AC
  Administered 2018-01-10: 909 mg via INTRAVENOUS
  Filled 2018-01-09: qty 909

## 2018-01-09 MED ORDER — PAPAVERINE HCL 30 MG/ML IJ SOLN
INTRAMUSCULAR | Status: AC
  Administered 2018-01-10: 500 mL
  Filled 2018-01-09: qty 2.5

## 2018-01-09 MED ORDER — BISACODYL 5 MG PO TBEC: 5.0000 mg | DELAYED_RELEASE_TABLET | Freq: Once | ORAL | Status: DC

## 2018-01-09 MED ORDER — CHLORHEXIDINE GLUCONATE 0.12 % MT SOLN
15.0000 mL | Freq: Once | OROMUCOSAL | Status: AC
  Administered 2018-01-10: 15 mL via OROMUCOSAL
  Filled 2018-01-09: qty 15

## 2018-01-09 MED ORDER — CHLORHEXIDINE GLUCONATE CLOTH 2 % EX PADS
6.0000 | MEDICATED_PAD | Freq: Once | CUTANEOUS | Status: AC
  Administered 2018-01-10: 6 via TOPICAL

## 2018-01-09 MED ORDER — NITROGLYCERIN IN D5W 200-5 MCG/ML-% IV SOLN
2.0000 ug/min | INTRAVENOUS | Status: AC
  Administered 2018-01-10: 16.6 ug/min via INTRAVENOUS
  Filled 2018-01-09: qty 250

## 2018-01-09 MED ORDER — DOPAMINE-DEXTROSE 3.2-5 MG/ML-% IV SOLN
0.0000 ug/kg/min | INTRAVENOUS | Status: DC
  Filled 2018-01-09: qty 250

## 2018-01-09 MED ORDER — LEVOFLOXACIN IN D5W 500 MG/100ML IV SOLN
500.0000 mg | INTRAVENOUS | Status: AC
  Administered 2018-01-10: 500 mg via INTRAVENOUS
  Filled 2018-01-09: qty 100

## 2018-01-09 MED ORDER — TRANEXAMIC ACID (OHS) PUMP PRIME SOLUTION
2.0000 mg/kg | INTRAVENOUS | Status: DC
  Filled 2018-01-09: qty 1.21

## 2018-01-09 MED ORDER — MAGNESIUM SULFATE 50 % IJ SOLN
40.0000 meq | INTRAMUSCULAR | Status: DC
  Filled 2018-01-09: qty 9.85

## 2018-01-09 MED ORDER — TRANEXAMIC ACID 1000 MG/10ML IV SOLN
1.5000 mg/kg/h | INTRAVENOUS | Status: AC
  Administered 2018-01-10: 1.5 mg/kg/h via INTRAVENOUS
  Filled 2018-01-09: qty 25

## 2018-01-09 MED ORDER — SODIUM CHLORIDE 0.9 % IV SOLN
30.0000 ug/min | INTRAVENOUS | Status: AC
  Administered 2018-01-10: 20 ug/min via INTRAVENOUS
  Filled 2018-01-09: qty 2

## 2018-01-09 MED ORDER — VANCOMYCIN HCL 10 G IV SOLR
1250.0000 mg | INTRAVENOUS | Status: AC
  Administered 2018-01-10: 1250 mg via INTRAVENOUS
  Filled 2018-01-09: qty 1250

## 2018-01-09 MED ORDER — DEXMEDETOMIDINE HCL IN NACL 400 MCG/100ML IV SOLN
0.1000 ug/kg/h | INTRAVENOUS | Status: AC
  Administered 2018-01-10: .2 ug/kg/h via INTRAVENOUS
  Filled 2018-01-09: qty 100

## 2018-01-09 NOTE — Progress Notes (Signed)
ANTICOAGULATION CONSULT NOTE - Follow Up Consult  Pharmacy Consult for Heparin Indication: CAD, for CABG on 8/13  Allergies  Allergen Reactions  . Contrast Media [Iodinated Diagnostic Agents] Anaphylaxis    Had CT scan 12/14/17, had trouble breathing, throat swelled  . Penicillins Rash    Has patient had a PCN reaction causing immediate rash, facial/tongue/throat swelling, SOB or lightheadedness with hypotension: yes Has patient had a PCN reaction causing severe rash involving mucus membranes or skin necrosis:  no Has patient had a PCN reaction that required hospitalization:  no Has patient had a PCN reaction occurring within the last 10 years: no If all of the above answers are "NO", then may proceed with Cephalosporin use.   . Codeine Anxiety    Dizziness and headache    Patient Measurements: Height: 5\' 3"  (160 cm) Weight: 133 lb 11.2 oz (60.6 kg) IBW/kg (Calculated) : 52.4 Heparin Dosing Weight: 60.6 kg  Vital Signs: Temp: 98.1 F (36.7 C) (08/12 0352) Temp Source: Oral (08/12 0352) BP: 113/73 (08/12 0919) Pulse Rate: 83 (08/12 0919)  Labs: Recent Labs    01/07/18 0310 01/08/18 0417 01/09/18 0327  HGB 10.2* 10.2* 9.8*  HCT 32.9* 32.6* 31.6*  PLT 184 190 176  HEPARINUNFRC 0.62 0.69 0.39  CREATININE  --   --  0.60    Estimated Creatinine Clearance: 61.9 mL/min (by C-G formula based on SCr of 0.6 mg/dL).  Assessment:  62 yo F found to have multivessel disease on cardiac cath 8/8. For CABG 8/13.   Heparin level is therapeutic (0.39) on 700 units/hr.  CBC stable.  Goal of Therapy:  Heparin level 0.3-0.7 units/ml Monitor platelets by anticoagulation protocol: Yes   Plan:   Continue heparin drip at 700 units/hr.  Daily heparin level and CBC while on heparin.  For CABG in am.  Dennie Fettersgan, Leler Brion Donovan, RPh Pager: [phone] or phone: [phone] 01/09/2018,10:07 AM

## 2018-01-09 NOTE — Progress Notes (Signed)
CARDIAC REHAB PHASE I   Preop ed completed with pt and mother. Reviewed sternal precautions and gave in-the-tube sheet. Pt demonstrated 1500 on IS, encouraged use prior to after surgery. Reviewed importance of walking. [facility] care guide given to pt and mother and they understand importance of having care after discharge. Heart surgery booklet given. Will continue to follow throughout stay.  [id number] Diana Boweneresa  [name], RN BSN 01/09/2018 3:06 PM

## 2018-01-09 NOTE — Progress Notes (Signed)
   01/09/18 1400  Clinical Encounter Type  Referral From Chaplain  Consult/Referral To Chaplain   Chaplain Green had responded to a request to complete HCPA.  Notary unavailable.  Just now going to visit the patient and she is sound asleep.  Will follow up later this afternoon. Chaplain Agustin CreeNewton Nashalie Sallis

## 2018-01-09 NOTE — Telephone Encounter (Signed)
Copied from CRM [phone]. Topic: General - Other >> Jan 09, 2018 12:45 PM Gerrianne ScalePayne, Angela L wrote: Reason for CRM: Pt Mother Bryan LemmaMarion [name] is calling stating that her daughter is in the Hospiptal and they want to do open heart surgery on her and her Mother Shirlee LimerickMarion has concerns about this surgery she would like a call back today asap at [phone]

## 2018-01-09 NOTE — Progress Notes (Signed)
Subjective:  No chest pain or SOB.  In good spirits.  No issues with heparin.   Objective:  Vital Signs in the last 24 hours: BP 107/75 (BP Location: Right Arm)   Pulse 79   Temp 98.1 F (36.7 C) (Oral)   Resp (!) 21   Ht 5\' 3"  (1.6 m)   Wt 60.6 kg   SpO2 100%   BMI 23.68 kg/m   Physical Exam: Pleasant friendly black female in no acute distress Lungs:  Clear Cardiac:  Regular rhythm, normal S1 and S2, no S3 Extremities:  No edema present  Intake/Output from previous day: 08/11 0701 - 08/12 0700 In: 516 [P.O.:240; I.V.:276] Out: -   Weight Filed Weights   01/07/18 0448 01/08/18 0434 01/09/18 0352  Weight: 60.6 kg 59.7 kg 60.6 kg    Lab Results: Basic Metabolic Panel: Recent Labs    01/09/18 0327  NA 139  K 3.6  CL 104  CO2 28  GLUCOSE 109*  BUN 16  CREATININE 0.60   CBC: Recent Labs    01/08/18 0417 01/09/18 0327  WBC 6.9 5.1  NEUTROABS 3.2 2.4  HGB 10.2* 9.8*  HCT 32.6* 31.6*  MCV 80.3 81.2  PLT 190 176    Telemetry: Sinus rhythm  Assessment/Plan:  1.  Unstable angina 2.  Severe coronary artery disease awaiting surgery 3.  Hypertension 4.  Hyperlipidemia 5.  Iron deficiency anemia  Recommendations:  Questions for surgery answered.  Stable.       Diana [name], Jr.  MD [facility]ACC Cardiology  01/09/2018, 9:04 AM

## 2018-01-09 NOTE — Anesthesia Preprocedure Evaluation (Addendum)
Anesthesia Evaluation  Patient identified by MRN, date of birth, ID band Patient awake    Reviewed: Allergy & Precautions, NPO status , Patient's Chart, lab work & pertinent test results  Airway Mallampati: II  TM Distance: >3 FB Neck ROM: Full    Dental  (+) Teeth Intact, Dental Advisory Given, Poor Dentition,    Pulmonary neg pulmonary ROS,    breath sounds clear to auscultation       Cardiovascular hypertension, + CAD and + Past MI   Rhythm:Regular Rate:Normal     Neuro/Psych  Neuromuscular disease negative psych ROS   GI/Hepatic Neg liver ROS, GERD  Medicated,  Endo/Other  negative endocrine ROS  Renal/GU negative Renal ROS     Musculoskeletal  (+) Arthritis ,   Abdominal Normal abdominal exam  (+)   Peds  Hematology   Anesthesia Other Findings   Reproductive/Obstetrics                          Lab Results  Component Value Date   WBC 5.1 01/09/2018   HGB 9.8 (L) 01/09/2018   HCT 31.6 (L) 01/09/2018   MCV 81.2 01/09/2018   PLT 176 01/09/2018   Lab Results  Component Value Date   CREATININE 0.60 01/09/2018   BUN 16 01/09/2018   NA 139 01/09/2018   K 3.6 01/09/2018   CL 104 01/09/2018   CO2 28 01/09/2018   Lab Results  Component Value Date   INR 0.99 01/06/2018   INR 0.99 10/30/2016   EKG: NSR  Echo: - Left ventricle: The cavity size was normal. Systolic function was   normal. The estimated ejection fraction was in the range of 55%   to 60%. Wall motion was normal; there were no regional wall   motion abnormalities. - Mitral valve: There was mild regurgitation.  Cath:  Prox RCA to Mid RCA lesion is 25% stenosed.  Ost 1st Mrg lesion is 80% stenosed.  Prox Cx to Mid Cx lesion is 95% stenosed.  Ost LAD to Prox LAD lesion is 80% stenosed.  Ost 1st Diag to 1st Diag lesion is 90% stenosed.  Prox LAD to Mid LAD lesion is 90% stenosed.  Ost 2nd Diag to 2nd Diag lesion  is 99% stenosed.  Mid LAD-1 lesion is 99% stenosed with 99% stenosed side branch in Ost 2nd Diag.  Mid LAD-2 lesion is 99% stenosed.  The left ventricular ejection fraction is 50-55% by visual estimate.  Anesthesia Physical Anesthesia Plan  ASA: IV  Anesthesia Plan: General   Post-op Pain Management:    Induction: Intravenous  PONV Risk Score and Plan: 3  Airway Management Planned: Oral ETT  Additional Equipment: Arterial line, CVP, PA Cath, TEE and Ultrasound Guidance Line Placement  Intra-op Plan:   Post-operative Plan: Post-operative intubation/ventilation  Informed Consent: I have reviewed the patients History and Physical, chart, labs and discussed the procedure including the risks, benefits and alternatives for the proposed anesthesia with the patient or authorized representative who has indicated his/her understanding and acceptance.   Dental advisory given  Plan Discussed with: CRNA  Anesthesia Plan Comments:        Anesthesia Quick Evaluation

## 2018-01-09 NOTE — Progress Notes (Signed)
[address]       [place] [id number]             [phone]                 4 Days Post-Op Procedure(s) (LRB): LEFT HEART CATH AND CORONARY ANGIOGRAPHY (N/A)  LOS: 4 days   Subjective: No chest pain   Objective: Vital signs in last 24 hours: Patient Vitals for the past 24 hrs:  BP Temp Temp src Pulse Resp SpO2 Weight  01/09/18 1300 98/70 98.3 F (36.8 C) Oral 79 20 100 % -  01/09/18 0919 113/73 - - 83 - - -  01/09/18 0352 107/75 98.1 F (36.7 C) Oral 79 (!) 21 100 % 60.6 kg  01/08/18 2128 109/65 98 F (36.7 C) Oral 72 17 100 % -  01/08/18 1535 124/73 97.7 F (36.5 C) Oral 72 - 100 % -    Filed Weights   01/07/18 0448 01/08/18 0434 01/09/18 0352  Weight: 60.6 kg 59.7 kg 60.6 kg    Hemodynamic parameters for last 24 hours:    Intake/Output from previous day: 08/11 0701 - 08/12 0700 In: 516 [P.O.:240; I.V.:276] Out: -  Intake/Output this shift: Total I/O In: 273.3 [P.O.:240; I.V.:33.3] Out: -   Scheduled Meds: . aspirin  81 mg Oral Daily  . atorvastatin  80 mg Oral q1800  . [START ON 01/10/2018] heparin-papaverine-plasmalyte irrigation   Irrigation To OR  . [START ON 01/10/2018] magnesium sulfate  40 mEq Other To OR  . metoprolol succinate  50 mg Oral Daily  . pantoprazole  40 mg Oral Daily  . [START ON 01/10/2018] potassium chloride  80 mEq Other To OR  . pregabalin  75 mg Oral BID  . sodium chloride flush  3 mL Intravenous Q12H  . [START ON 01/10/2018] tranexamic acid  15 mg/kg Intravenous To OR  . [START ON 01/10/2018] tranexamic acid  2 mg/kg Intracatheter To OR   Continuous Infusions: . sodium chloride Stopped (01/05/18 1835)  . [START ON 01/10/2018] dexmedetomidine    . [START ON 01/10/2018] DOPamine    . [START ON 01/10/2018] epinephrine    . [START ON 01/10/2018] heparin 30,000 units/NS 1000 mL solution for CELLSAVER    . heparin 700 Units/hr (01/09/18 0657)  . [START ON 01/10/2018] insulin (NOVOLIN-R) infusion    . [START ON  01/10/2018] levofloxacin (LEVAQUIN) IV    . [START ON 01/10/2018] milrinone    . [START ON 01/10/2018] nitroGLYCERIN    . [START ON 01/10/2018] phenylephrine 20mg /27050mL NS (0.08mg /ml) infusion    . [START ON 01/10/2018] tranexamic acid (CYKLOKAPRON) infusion ([facility])    . [START ON 01/10/2018] vancomycin     PRN Meds:.sodium chloride, acetaminophen, ALPRAZolam, cyclobenzaprine, diazepam, nitroGLYCERIN, ondansetron (ZOFRAN) IV, ondansetron, sodium chloride flush  General appearance: alert and cooperative Neurologic: intact Heart: regular rate and rhythm, S1, S2 normal, no murmur, click, rub or gallop Lungs: clear to auscultation bilaterally Abdomen: soft, non-tender; bowel sounds normal; no masses,  no organomegaly Extremities: extremities normal, atraumatic, no cyanosis or edema and Homans sign is negative, no sign of DVT  Lab Results: CBC: Recent Labs    01/08/18 0417 01/09/18 0327  WBC 6.9 5.1  HGB 10.2* 9.8*  HCT 32.6* 31.6*  PLT 190 176   BMET:  Recent Labs    01/09/18 0327  NA 139  K 3.6  CL 104  CO2 28  GLUCOSE 109*  BUN 16  CREATININE 0.60  CALCIUM 8.8*  PT/INR: No results for input(s): LABPROT, INR in the last 72 hours.   Radiology No results found.   Assessment/Plan: Plan CABG tomorrow The goals risks and alternatives of the planned surgical procedure CABG have been discussed with the patient in detail. The risks of the procedure including death, infection, stroke, myocardial infarction, bleeding, blood transfusion have all been discussed specifically.  I have quoted Diana [name] a 2% of perioperative mortality and a complication rate as high as 30 %. The patient's questions have been answered.Diana Muscaamela M [name] is willing  to proceed with the planned procedure.   Delight OvensEdward B Siria Calandro MD 01/09/2018 3:09 PM     Patient ID: Diana [name], female   DOB: 01/29/1956, 62 y.o.   MRN: [id number]

## 2018-01-10 ENCOUNTER — Encounter (HOSPITAL_COMMUNITY)
Admission: AD | Disposition: A | Discharge status: Home / Self Care | Payer: Self-pay | Source: Ambulatory Visit | Attending: Cardiothoracic Surgery

## 2018-01-10 ENCOUNTER — Encounter (HOSPITAL_COMMUNITY): Payer: Self-pay | Admitting: Anesthesiology

## 2018-01-10 ENCOUNTER — Inpatient Hospital Stay (HOSPITAL_COMMUNITY): Payer: BLUE CROSS/BLUE SHIELD | Admitting: Anesthesiology

## 2018-01-10 ENCOUNTER — Inpatient Hospital Stay (HOSPITAL_COMMUNITY): Payer: BLUE CROSS/BLUE SHIELD

## 2018-01-10 DIAGNOSIS — Z951 Presence of aortocoronary bypass graft: Secondary | ICD-10-CM

## 2018-01-10 HISTORY — PX: CORONARY ARTERY BYPASS GRAFT: SHX141

## 2018-01-10 HISTORY — PX: TEE WITHOUT CARDIOVERSION: SHX5443

## 2018-01-10 LAB — POCT I-STAT, CHEM 8
BUN: 15 mg/dL (ref 6–20)
BUN: 12 mg/dL (ref 6–20)
BUN: 12 mg/dL (ref 6–20)
BUN: 13 mg/dL (ref 6–20)
BUN: 11 mg/dL (ref 6–20)
BUN: 12 mg/dL (ref 6–20)
BUN: 10 mg/dL (ref 6–20)
CALCIUM ION: 1.23 mmol/L (ref 1.15–1.40)
CALCIUM ION: 1.01 mmol/L — AB (ref 1.15–1.40)
CALCIUM ION: 1.24 mmol/L (ref 1.15–1.40)
CALCIUM ION: 1.17 mmol/L (ref 1.15–1.40)
CHLORIDE: 102 mmol/L (ref 98–111)
CREATININE: 0.4 mg/dL — AB (ref 0.44–1.00)
CREATININE: 0.4 mg/dL — AB (ref 0.44–1.00)
CREATININE: 0.4 mg/dL — AB (ref 0.44–1.00)
Calcium, Ion: 1.11 mmol/L — ABNORMAL LOW (ref 1.15–1.40)
Calcium, Ion: 1.06 mmol/L — ABNORMAL LOW (ref 1.15–1.40)
Calcium, Ion: 1.15 mmol/L (ref 1.15–1.40)
Chloride: 103 mmol/L (ref 98–111)
Chloride: 104 mmol/L (ref 98–111)
Chloride: 106 mmol/L (ref 98–111)
Chloride: 103 mmol/L (ref 98–111)
Chloride: 107 mmol/L (ref 98–111)
Chloride: 104 mmol/L (ref 98–111)
Creatinine, Ser: 0.5 mg/dL (ref 0.44–1.00)
Creatinine, Ser: 0.4 mg/dL — ABNORMAL LOW (ref 0.44–1.00)
Creatinine, Ser: 0.4 mg/dL — ABNORMAL LOW (ref 0.44–1.00)
Creatinine, Ser: 0.4 mg/dL — ABNORMAL LOW (ref 0.44–1.00)
GLUCOSE: 116 mg/dL — AB (ref 70–99)
GLUCOSE: 210 mg/dL — AB (ref 70–99)
Glucose, Bld: 105 mg/dL — ABNORMAL HIGH (ref 70–99)
Glucose, Bld: 86 mg/dL (ref 70–99)
Glucose, Bld: 92 mg/dL (ref 70–99)
Glucose, Bld: 106 mg/dL — ABNORMAL HIGH (ref 70–99)
Glucose, Bld: 114 mg/dL — ABNORMAL HIGH (ref 70–99)
HCT: 27 % — ABNORMAL LOW (ref 36.0–46.0)
HCT: 23 % — ABNORMAL LOW (ref 36.0–46.0)
HCT: 25 % — ABNORMAL LOW (ref 36.0–46.0)
HCT: 22 % — ABNORMAL LOW (ref 36.0–46.0)
HCT: 32 % — ABNORMAL LOW (ref 36.0–46.0)
HEMATOCRIT: 20 % — AB (ref 36.0–46.0)
HEMATOCRIT: 25 % — AB (ref 36.0–46.0)
HEMOGLOBIN: 7.8 g/dL — AB (ref 12.0–15.0)
HEMOGLOBIN: 8.5 g/dL — AB (ref 12.0–15.0)
Hemoglobin: 9.2 g/dL — ABNORMAL LOW (ref 12.0–15.0)
Hemoglobin: 6.8 g/dL — CL (ref 12.0–15.0)
Hemoglobin: 7.5 g/dL — ABNORMAL LOW (ref 12.0–15.0)
Hemoglobin: 8.5 g/dL — ABNORMAL LOW (ref 12.0–15.0)
Hemoglobin: 10.9 g/dL — ABNORMAL LOW (ref 12.0–15.0)
POTASSIUM: 4.7 mmol/L (ref 3.5–5.1)
POTASSIUM: 4.7 mmol/L (ref 3.5–5.1)
Potassium: 3.9 mmol/L (ref 3.5–5.1)
Potassium: 3.6 mmol/L (ref 3.5–5.1)
Potassium: 3.7 mmol/L (ref 3.5–5.1)
Potassium: 4.2 mmol/L (ref 3.5–5.1)
Potassium: 4.3 mmol/L (ref 3.5–5.1)
SODIUM: 139 mmol/L (ref 135–145)
SODIUM: 141 mmol/L (ref 135–145)
SODIUM: 143 mmol/L (ref 135–145)
SODIUM: 142 mmol/L (ref 135–145)
Sodium: 143 mmol/L (ref 135–145)
Sodium: 143 mmol/L (ref 135–145)
Sodium: 139 mmol/L (ref 135–145)
TCO2: 29 mmol/L (ref 22–32)
TCO2: 26 mmol/L (ref 22–32)
TCO2: 27 mmol/L (ref 22–32)
TCO2: 27 mmol/L (ref 22–32)
TCO2: 26 mmol/L (ref 22–32)
TCO2: 27 mmol/L (ref 22–32)
TCO2: 24 mmol/L (ref 22–32)

## 2018-01-10 LAB — POCT I-STAT 3, ART BLOOD GAS (G3+)
ACID-BASE DEFICIT: 3 mmol/L — AB (ref 0.0–2.0)
ACID-BASE DEFICIT: 1 mmol/L (ref 0.0–2.0)
ACID-BASE EXCESS: 4 mmol/L — AB (ref 0.0–2.0)
BICARBONATE: 24.1 mmol/L (ref 20.0–28.0)
Bicarbonate: 27 mmol/L (ref 20.0–28.0)
Bicarbonate: 23.8 mmol/L (ref 20.0–28.0)
Bicarbonate: 25.5 mmol/L (ref 20.0–28.0)
O2 SAT: 100 %
O2 SAT: 100 %
O2 SAT: 99 %
O2 Saturation: 97 %
PCO2 ART: 32.5 mmHg (ref 32.0–48.0)
PCO2 ART: 44 mmHg (ref 32.0–48.0)
PCO2 ART: 47.2 mmHg (ref 32.0–48.0)
PH ART: 7.431 (ref 7.350–7.450)
PO2 ART: 202 mmHg — AB (ref 83.0–108.0)
PO2 ART: 143 mmHg — AB (ref 83.0–108.0)
PO2 ART: 84 mmHg (ref 83.0–108.0)
Patient temperature: 35.1
Patient temperature: 36.1
TCO2: 28 mmol/L (ref 22–32)
TCO2: 25 mmol/L (ref 22–32)
TCO2: 27 mmol/L (ref 22–32)
TCO2: 25 mmol/L (ref 22–32)
pCO2 arterial: 36.1 mmHg (ref 32.0–48.0)
pH, Arterial: 7.527 — ABNORMAL HIGH (ref 7.350–7.450)
pH, Arterial: 7.331 — ABNORMAL LOW (ref 7.350–7.450)
pH, Arterial: 7.337 — ABNORMAL LOW (ref 7.350–7.450)
pO2, Arterial: 319 mmHg — ABNORMAL HIGH (ref 83.0–108.0)

## 2018-01-10 LAB — CBC
HCT: 31.9 % — ABNORMAL LOW (ref 36.0–46.0)
HEMATOCRIT: 31.5 % — AB (ref 36.0–46.0)
HEMOGLOBIN: 10.4 g/dL — AB (ref 12.0–15.0)
Hemoglobin: 10.4 g/dL — ABNORMAL LOW (ref 12.0–15.0)
MCH: 27.2 pg (ref 26.0–34.0)
MCH: 27.2 pg (ref 26.0–34.0)
MCHC: 33 g/dL (ref 30.0–36.0)
MCHC: 32.6 g/dL (ref 30.0–36.0)
MCV: 82.2 fL (ref 78.0–100.0)
MCV: 83.5 fL (ref 78.0–100.0)
Platelets: 70 10*3/uL — ABNORMAL LOW (ref 150–400)
Platelets: 89 10*3/uL — ABNORMAL LOW (ref 150–400)
RBC: 3.83 MIL/uL — ABNORMAL LOW (ref 3.87–5.11)
RBC: 3.82 MIL/uL — ABNORMAL LOW (ref 3.87–5.11)
RDW: 14.2 % (ref 11.5–15.5)
RDW: 14.4 % (ref 11.5–15.5)
WBC: 6.9 10*3/uL (ref 4.0–10.5)
WBC: 12.6 10*3/uL — ABNORMAL HIGH (ref 4.0–10.5)

## 2018-01-10 LAB — CBC WITH DIFFERENTIAL/PLATELET
Abs Immature Granulocytes: 0.1 10*3/uL (ref 0.0–0.1)
Basophils Absolute: 0 10*3/uL (ref 0.0–0.1)
Basophils Relative: 0 %
EOS ABS: 0.2 10*3/uL (ref 0.0–0.7)
EOS PCT: 3 %
HEMATOCRIT: 29.7 % — AB (ref 36.0–46.0)
Hemoglobin: 9.2 g/dL — ABNORMAL LOW (ref 12.0–15.0)
Immature Granulocytes: 1 %
Lymphocytes Relative: 45 %
Lymphs Abs: 2.6 10*3/uL (ref 0.7–4.0)
MCH: 25.1 pg — AB (ref 26.0–34.0)
MCHC: 31 g/dL (ref 30.0–36.0)
MCV: 81.1 fL (ref 78.0–100.0)
MONOS PCT: 9 %
Monocytes Absolute: 0.5 10*3/uL (ref 0.1–1.0)
Neutro Abs: 2.4 10*3/uL (ref 1.7–7.7)
Neutrophils Relative %: 42 %
Platelets: 181 10*3/uL (ref 150–400)
RBC: 3.66 MIL/uL — ABNORMAL LOW (ref 3.87–5.11)
RDW: 13.5 % (ref 11.5–15.5)
WBC: 5.8 10*3/uL (ref 4.0–10.5)

## 2018-01-10 LAB — GLUCOSE, CAPILLARY
GLUCOSE-CAPILLARY: 131 mg/dL — AB (ref 70–99)
Glucose-Capillary: 76 mg/dL (ref 70–99)
Glucose-Capillary: 201 mg/dL — ABNORMAL HIGH (ref 70–99)

## 2018-01-10 LAB — POCT I-STAT 4, (NA,K, GLUC, HGB,HCT)
GLUCOSE: 106 mg/dL — AB (ref 70–99)
HEMATOCRIT: 29 % — AB (ref 36.0–46.0)
HEMOGLOBIN: 9.9 g/dL — AB (ref 12.0–15.0)
Potassium: 3.5 mmol/L (ref 3.5–5.1)
Sodium: 145 mmol/L (ref 135–145)

## 2018-01-10 LAB — PREPARE RBC (CROSSMATCH)

## 2018-01-10 LAB — CREATININE, SERUM
Creatinine, Ser: 0.5 mg/dL (ref 0.44–1.00)
GFR calc Af Amer: >60 mL/min (ref >60)
GFR calc non Af Amer: >60 mL/min (ref >60)

## 2018-01-10 LAB — PROTIME-INR
INR: 1.48
PROTHROMBIN TIME: 17.8 s — AB (ref 11.4–15.2)

## 2018-01-10 LAB — BASIC METABOLIC PANEL
Anion gap: 8 (ref 5–15)
BUN: 16 mg/dL (ref 6–20)
CO2: 26 mmol/L (ref 22–32)
Calcium: 8.8 mg/dL — ABNORMAL LOW (ref 8.9–10.3)
Chloride: 103 mmol/L (ref 98–111)
Creatinine, Ser: 0.71 mg/dL (ref 0.44–1.00)
GFR calc Af Amer: >60 mL/min (ref >60)
GFR calc non Af Amer: >60 mL/min (ref >60)
Glucose, Bld: 106 mg/dL — ABNORMAL HIGH (ref 70–99)
Potassium: 3.7 mmol/L (ref 3.5–5.1)
Sodium: 137 mmol/L (ref 135–145)

## 2018-01-10 LAB — HEMOGLOBIN AND HEMATOCRIT, BLOOD
HCT: 22.1 % — ABNORMAL LOW (ref 36.0–46.0)
Hemoglobin: 7 g/dL — ABNORMAL LOW (ref 12.0–15.0)

## 2018-01-10 LAB — MAGNESIUM: Magnesium: 2.4 mg/dL (ref 1.7–2.4)

## 2018-01-10 LAB — PLATELET COUNT: Platelets: 109 10*3/uL — ABNORMAL LOW (ref 150–400)

## 2018-01-10 LAB — APTT: aPTT: 35 seconds (ref 24–36)

## 2018-01-10 LAB — HEPARIN LEVEL (UNFRACTIONATED): Heparin Unfractionated: 0.32 IU/mL (ref 0.30–0.70)

## 2018-01-10 IMAGING — DX DG CHEST 1V PORT
1 series · 1 of 1 positions shown · non-contrast
Comparison: [DATE], [DATE], CT chest [DATE]

CLINICAL DATA: Status post CABG

EXAM:
PORTABLE CHEST 1 VIEW

[chest]
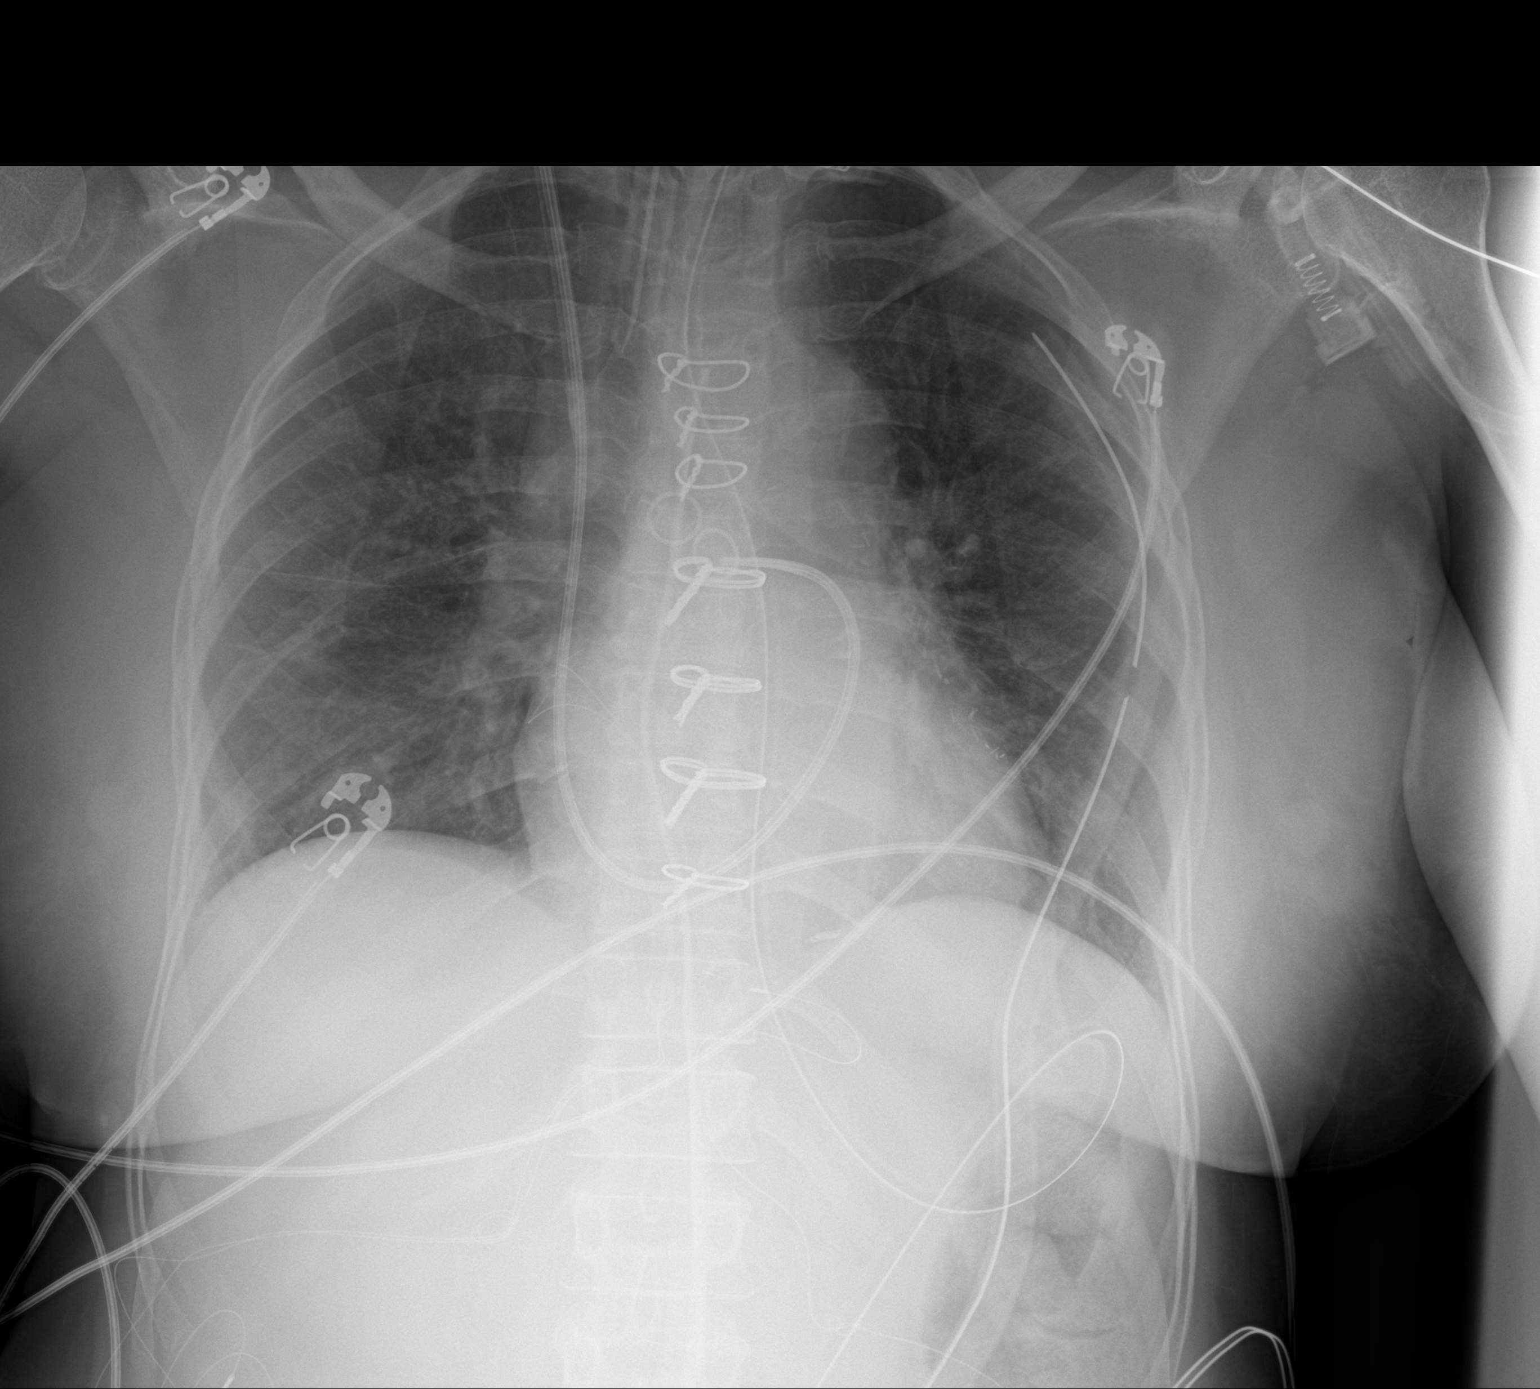

[1 of 1 positions shown; findings below may reference images not displayed]

FINDINGS: Endotracheal tube tip is about 2.6 cm superior to the carina.
Esophageal tube tip is below the diaphragm but non included.
Right-sided Swan-Ganz catheter tip projects over the central
pulmonary confluence. Post sternotomy changes. Central and
left-sided chest drainage catheters. No definitive pneumothorax.
Minimal atelectasis at the left base. Stable cardiomediastinal
silhouette with minimal central vascular congestion.
IMPRESSION: 1. Support lines and tubes as above.
2. Stable cardiomediastinal silhouette with minimal vascular
congestion. Minimal atelectasis at the left base.

## 2018-01-10 IMAGING — CR DG CHEST 1V PORT
1 series · 1 of 1 positions shown · non-contrast
Comparison: [DATE]

CLINICAL DATA: Hypoxia

EXAM:
PORTABLE CHEST 1 VIEW

[AP]
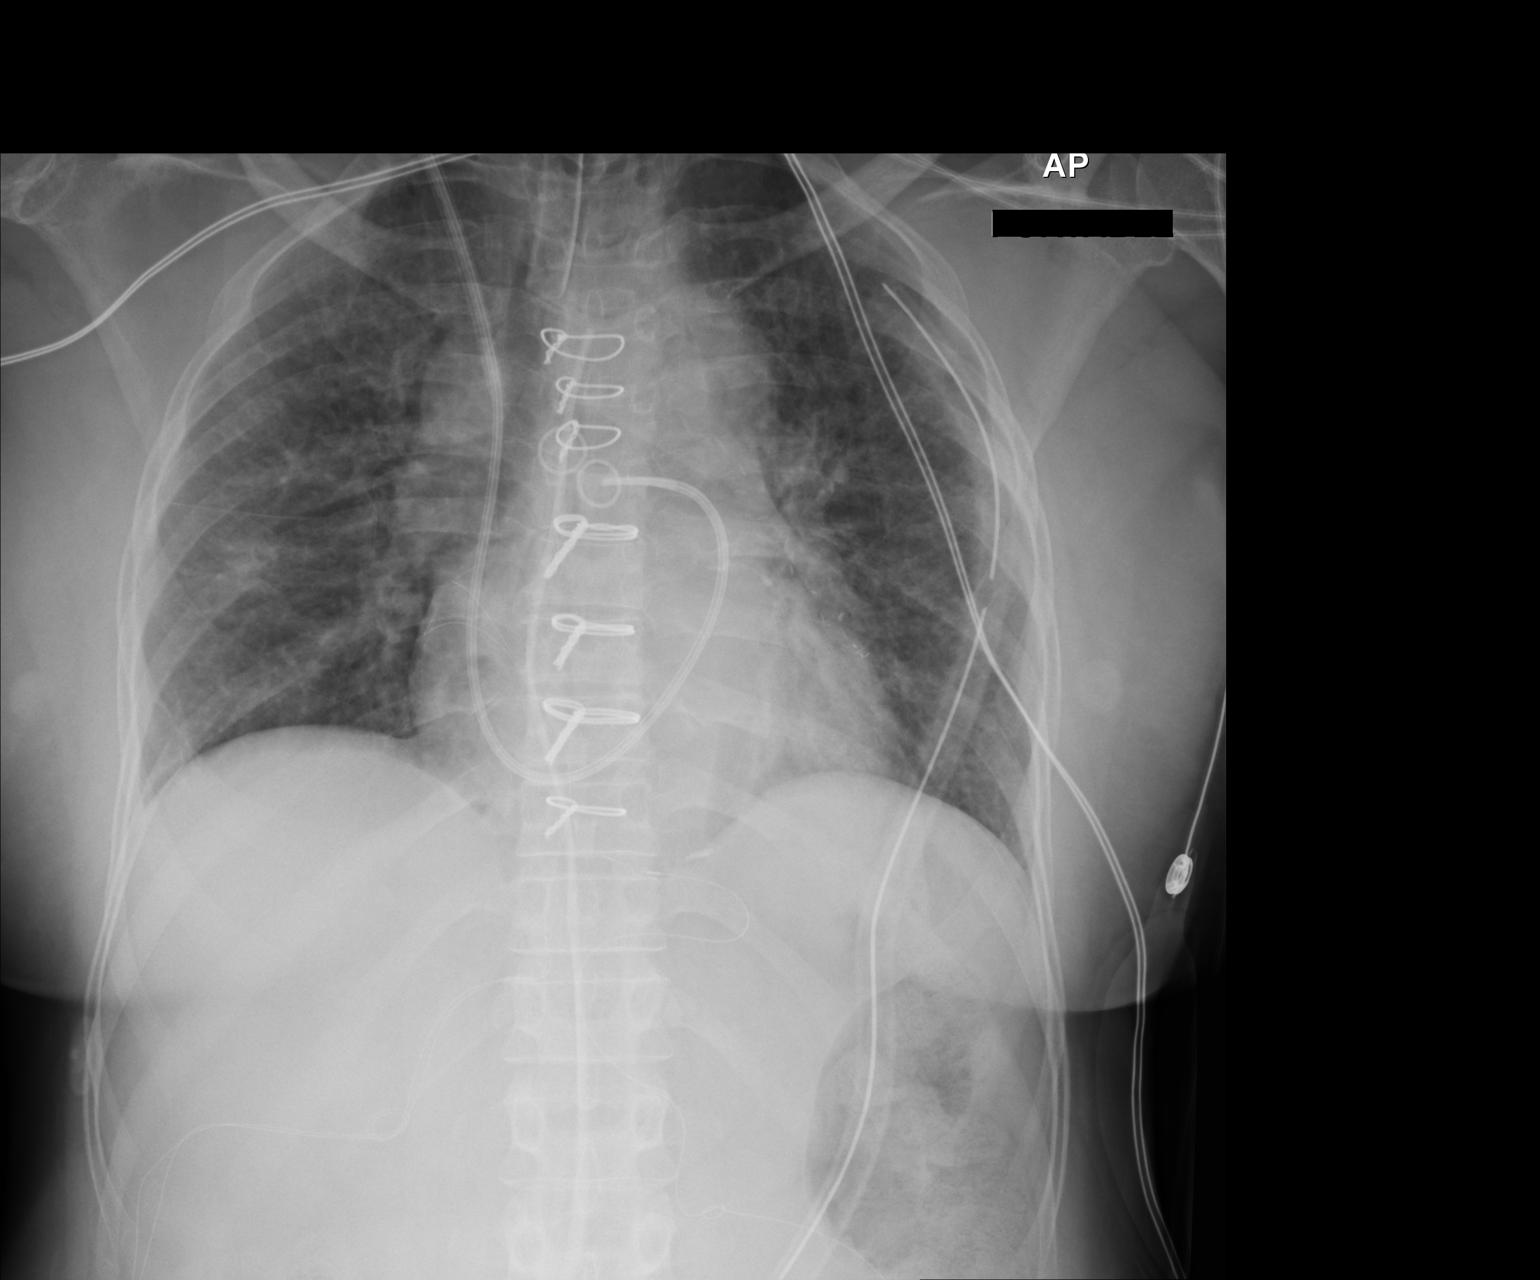

[1 of 1 positions shown; findings below may reference images not displayed]

FINDINGS: No evident retained needle. Endotracheal tube tip is 3.6 cm above
the carina. Central catheter tip is in the main pulmonary outflow
tract. There is a left chest tube and a mediastinal drain. Temporary
pacemaker wires are attached to the right heart. No pneumothorax.
There is mild atelectatic change in each lung base. No edema or
consolidation. Heart size and pulmonary vascularity are normal. No
adenopathy. Patient is status post coronary artery bypass grafting.
No evident bone lesions.
IMPRESSION: Tube and catheter positions as described without pneumothorax. No
edema or consolidation. Mild bibasilar atelectasis. No evident
retained needle.

These results were called by telephone at the time of interpretation
on [DATE] at [DATE] to CROCKER, RN , who verbally
acknowledged these results.

## 2018-01-10 SURGERY — CORONARY ARTERY BYPASS GRAFTING (CABG)
Anesthesia: General | Site: Chest

## 2018-01-10 MED ORDER — CHLORHEXIDINE GLUCONATE 0.12% ORAL RINSE (MEDLINE KIT)
15.0000 mL | Freq: Two times a day (BID) | OROMUCOSAL | Status: DC
  Administered 2018-01-10: 15 mL via OROMUCOSAL

## 2018-01-10 MED ORDER — MIDAZOLAM HCL 10 MG/2ML IJ SOLN
INTRAMUSCULAR | Status: AC
  Filled 2018-01-10: qty 2

## 2018-01-10 MED ORDER — ALBUMIN HUMAN 5 % IV SOLN
250.0000 mL | INTRAVENOUS | Status: AC | PRN
  Administered 2018-01-10 (×3): 250 mL via INTRAVENOUS
  Filled 2018-01-10: qty 250

## 2018-01-10 MED ORDER — NITROGLYCERIN IN D5W 200-5 MCG/ML-% IV SOLN: 0.0000 ug/min | INTRAVENOUS | Status: DC

## 2018-01-10 MED ORDER — HEPARIN SODIUM (PORCINE) 1000 UNIT/ML IJ SOLN
INTRAMUSCULAR | Status: DC | PRN
  Administered 2018-01-10: 20000 [IU] via INTRAVENOUS

## 2018-01-10 MED ORDER — SODIUM CHLORIDE 0.9 % IV SOLN: 250.0000 mL | INTRAVENOUS | Status: DC

## 2018-01-10 MED ORDER — POTASSIUM CHLORIDE 10 MEQ/50ML IV SOLN
10.0000 meq | INTRAVENOUS | Status: AC
  Administered 2018-01-10 (×3): 10 meq via INTRAVENOUS

## 2018-01-10 MED ORDER — PHENYLEPHRINE HCL-NACL 20-0.9 MG/250ML-% IV SOLN
0.0000 ug/min | INTRAVENOUS | Status: DC
  Filled 2018-01-10: qty 250

## 2018-01-10 MED ORDER — ONDANSETRON HCL 4 MG/2ML IJ SOLN
INTRAMUSCULAR | Status: AC
  Filled 2018-01-10: qty 2

## 2018-01-10 MED ORDER — ASPIRIN 81 MG PO CHEW: 324.0000 mg | CHEWABLE_TABLET | Freq: Every day | ORAL | Status: DC

## 2018-01-10 MED ORDER — BISACODYL 5 MG PO TBEC
10.0000 mg | DELAYED_RELEASE_TABLET | Freq: Every day | ORAL | Status: DC
  Administered 2018-01-11 – 2018-01-12 (×2): 10 mg via ORAL
  Filled 2018-01-10 (×2): qty 2

## 2018-01-10 MED ORDER — FENTANYL CITRATE (PF) 250 MCG/5ML IJ SOLN
INTRAMUSCULAR | Status: AC
  Filled 2018-01-10: qty 5

## 2018-01-10 MED ORDER — FENTANYL CITRATE (PF) 100 MCG/2ML IJ SOLN
INTRAMUSCULAR | Status: DC | PRN
  Administered 2018-01-10: 100 ug via INTRAVENOUS
  Administered 2018-01-10: 150 ug via INTRAVENOUS
  Administered 2018-01-10 (×2): 100 ug via INTRAVENOUS
  Administered 2018-01-10: 50 ug via INTRAVENOUS
  Administered 2018-01-10: 100 ug via INTRAVENOUS
  Administered 2018-01-10: 50 ug via INTRAVENOUS
  Administered 2018-01-10: 100 ug via INTRAVENOUS
  Administered 2018-01-10 (×2): 50 ug via INTRAVENOUS
  Administered 2018-01-10: 150 ug via INTRAVENOUS
  Administered 2018-01-10: 50 ug via INTRAVENOUS
  Administered 2018-01-10: 100 ug via INTRAVENOUS
  Administered 2018-01-10: 50 ug via INTRAVENOUS
  Administered 2018-01-10: 100 ug via INTRAVENOUS

## 2018-01-10 MED ORDER — PANTOPRAZOLE SODIUM 40 MG PO TBEC
40.0000 mg | DELAYED_RELEASE_TABLET | Freq: Every day | ORAL | Status: DC
  Administered 2018-01-12: 40 mg via ORAL
  Filled 2018-01-10: qty 1

## 2018-01-10 MED ORDER — ROCURONIUM BROMIDE 50 MG/5ML IV SOSY
PREFILLED_SYRINGE | INTRAVENOUS | Status: DC | PRN
  Administered 2018-01-10: 50 mg via INTRAVENOUS
  Administered 2018-01-10: 70 mg via INTRAVENOUS
  Administered 2018-01-10: 30 mg via INTRAVENOUS
  Administered 2018-01-10: 50 mg via INTRAVENOUS

## 2018-01-10 MED ORDER — MORPHINE SULFATE (PF) 2 MG/ML IV SOLN
2.0000 mg | INTRAVENOUS | Status: DC | PRN
  Administered 2018-01-11: 4 mg via INTRAVENOUS
  Administered 2018-01-11: 2 mg via INTRAVENOUS
  Administered 2018-01-11: 4 mg via INTRAVENOUS
  Administered 2018-01-12: 2 mg via INTRAVENOUS
  Filled 2018-01-10 (×3): qty 2
  Filled 2018-01-10: qty 1

## 2018-01-10 MED ORDER — HEPARIN SODIUM (PORCINE) 1000 UNIT/ML IJ SOLN
INTRAMUSCULAR | Status: AC
  Filled 2018-01-10: qty 2

## 2018-01-10 MED ORDER — INSULIN REGULAR BOLUS VIA INFUSION
0.0000 [IU] | Freq: Three times a day (TID) | INTRAVENOUS | Status: DC
  Filled 2018-01-10: qty 10

## 2018-01-10 MED ORDER — MIDAZOLAM HCL 2 MG/2ML IJ SOLN: 2.0000 mg | INTRAMUSCULAR | Status: DC | PRN

## 2018-01-10 MED ORDER — SODIUM CHLORIDE 0.9 % IV SOLN
INTRAVENOUS | Status: DC
  Filled 2018-01-10: qty 1

## 2018-01-10 MED ORDER — MIDAZOLAM HCL 5 MG/5ML IJ SOLN
INTRAMUSCULAR | Status: DC | PRN
  Administered 2018-01-10: 2 mg via INTRAVENOUS
  Administered 2018-01-10: 1 mg via INTRAVENOUS
  Administered 2018-01-10 (×3): 2 mg via INTRAVENOUS
  Administered 2018-01-10: 1 mg via INTRAVENOUS

## 2018-01-10 MED ORDER — ORAL CARE MOUTH RINSE
15.0000 mL | OROMUCOSAL | Status: DC
  Administered 2018-01-10 (×2): 15 mL via OROMUCOSAL

## 2018-01-10 MED ORDER — HYDRALAZINE HCL 20 MG/ML IJ SOLN
10.0000 mg | Freq: Four times a day (QID) | INTRAMUSCULAR | Status: DC | PRN
  Administered 2018-01-10: 10 mg via INTRAVENOUS
  Filled 2018-01-10: qty 1

## 2018-01-10 MED ORDER — LIDOCAINE 2% (20 MG/ML) 5 ML SYRINGE
INTRAMUSCULAR | Status: AC
  Filled 2018-01-10: qty 5

## 2018-01-10 MED ORDER — MAGNESIUM SULFATE 4 GM/100ML IV SOLN
4.0000 g | Freq: Once | INTRAVENOUS | Status: AC
  Administered 2018-01-10: 4 g via INTRAVENOUS
  Filled 2018-01-10: qty 100

## 2018-01-10 MED ORDER — MORPHINE SULFATE (PF) 2 MG/ML IV SOLN
1.0000 mg | INTRAVENOUS | Status: AC | PRN
  Administered 2018-01-10: 4 mg via INTRAVENOUS
  Filled 2018-01-10: qty 2

## 2018-01-10 MED ORDER — VANCOMYCIN HCL IN DEXTROSE 1-5 GM/200ML-% IV SOLN
1000.0000 mg | Freq: Once | INTRAVENOUS | Status: AC
  Administered 2018-01-10: 1000 mg via INTRAVENOUS
  Filled 2018-01-10: qty 200

## 2018-01-10 MED ORDER — LACTATED RINGERS IV SOLN
INTRAVENOUS | Status: DC | PRN
  Administered 2018-01-10 (×2): via INTRAVENOUS

## 2018-01-10 MED ORDER — ACETAMINOPHEN 160 MG/5ML PO SOLN: 650.0000 mg | Freq: Once | ORAL | Status: AC

## 2018-01-10 MED ORDER — CHLORHEXIDINE GLUCONATE 0.12 % MT SOLN
15.0000 mL | OROMUCOSAL | Status: AC
  Administered 2018-01-10: 15 mL via OROMUCOSAL

## 2018-01-10 MED ORDER — CHLORHEXIDINE GLUCONATE CLOTH 2 % EX PADS
6.0000 | MEDICATED_PAD | Freq: Every day | CUTANEOUS | Status: DC
  Administered 2018-01-10 – 2018-01-13 (×3): 6 via TOPICAL

## 2018-01-10 MED ORDER — HEMOSTATIC AGENTS (NO CHARGE) OPTIME
TOPICAL | Status: DC | PRN
  Administered 2018-01-10 (×2): 1 via TOPICAL

## 2018-01-10 MED ORDER — PROTAMINE SULFATE 10 MG/ML IV SOLN
INTRAVENOUS | Status: AC
  Filled 2018-01-10: qty 25

## 2018-01-10 MED ORDER — 0.9 % SODIUM CHLORIDE (POUR BTL) OPTIME
TOPICAL | Status: DC | PRN
  Administered 2018-01-10: 6000 mL

## 2018-01-10 MED ORDER — FAMOTIDINE IN NACL 20-0.9 MG/50ML-% IV SOLN
20.0000 mg | Freq: Two times a day (BID) | INTRAVENOUS | Status: DC
  Administered 2018-01-10: 20 mg via INTRAVENOUS

## 2018-01-10 MED ORDER — ROCURONIUM BROMIDE 10 MG/ML (PF) SYRINGE
PREFILLED_SYRINGE | INTRAVENOUS | Status: AC
  Filled 2018-01-10: qty 10

## 2018-01-10 MED ORDER — SODIUM CHLORIDE 0.9% IV SOLUTION: Freq: Once | INTRAVENOUS | Status: DC

## 2018-01-10 MED ORDER — ALBUMIN HUMAN 5 % IV SOLN
INTRAVENOUS | Status: DC | PRN
  Administered 2018-01-10: 12:00:00 via INTRAVENOUS

## 2018-01-10 MED ORDER — ACETAMINOPHEN 160 MG/5ML PO SOLN
1000.0000 mg | Freq: Four times a day (QID) | ORAL | Status: DC
  Filled 2018-01-10: qty 40.6

## 2018-01-10 MED ORDER — METOPROLOL TARTRATE 12.5 MG HALF TABLET
12.5000 mg | ORAL_TABLET | Freq: Two times a day (BID) | ORAL | Status: DC
  Filled 2018-01-10: qty 1

## 2018-01-10 MED ORDER — PROPOFOL 10 MG/ML IV BOLUS
INTRAVENOUS | Status: DC | PRN
  Administered 2018-01-10: 200 mg via INTRAVENOUS
  Administered 2018-01-10: 20 mg via INTRAVENOUS

## 2018-01-10 MED ORDER — LACTATED RINGERS IV SOLN
INTRAVENOUS | Status: DC | PRN
  Administered 2018-01-10: 07:00:00 via INTRAVENOUS

## 2018-01-10 MED ORDER — PHENYLEPHRINE 40 MCG/ML (10ML) SYRINGE FOR IV PUSH (FOR BLOOD PRESSURE SUPPORT)
PREFILLED_SYRINGE | INTRAVENOUS | Status: DC | PRN
  Administered 2018-01-10: 10 ug via INTRAVENOUS
  Administered 2018-01-10 (×2): 20 ug via INTRAVENOUS

## 2018-01-10 MED ORDER — ASPIRIN EC 325 MG PO TBEC
325.0000 mg | DELAYED_RELEASE_TABLET | Freq: Every day | ORAL | Status: DC
  Administered 2018-01-11 – 2018-01-12 (×2): 325 mg via ORAL
  Filled 2018-01-10 (×2): qty 1

## 2018-01-10 MED ORDER — ORAL CARE MOUTH RINSE
15.0000 mL | Freq: Two times a day (BID) | OROMUCOSAL | Status: DC
  Administered 2018-01-10 – 2018-01-19 (×14): 15 mL via OROMUCOSAL

## 2018-01-10 MED ORDER — SODIUM CHLORIDE 0.9% FLUSH
3.0000 mL | Freq: Two times a day (BID) | INTRAVENOUS | Status: DC
  Administered 2018-01-11 – 2018-01-12 (×3): 3 mL via INTRAVENOUS

## 2018-01-10 MED ORDER — SODIUM CHLORIDE 0.9 % IV SOLN
INTRAVENOUS | Status: DC
  Administered 2018-01-10: 14:00:00 via INTRAVENOUS

## 2018-01-10 MED ORDER — METOPROLOL TARTRATE 5 MG/5ML IV SOLN
2.5000 mg | INTRAVENOUS | Status: DC | PRN
  Administered 2018-01-10 (×2): 2.5 mg via INTRAVENOUS
  Filled 2018-01-10: qty 5

## 2018-01-10 MED ORDER — SODIUM CHLORIDE 0.9 % IJ SOLN
INTRAMUSCULAR | Status: AC
  Filled 2018-01-10: qty 30

## 2018-01-10 MED ORDER — ARTIFICIAL TEARS OPHTHALMIC OINT
TOPICAL_OINTMENT | OPHTHALMIC | Status: DC | PRN
  Administered 2018-01-10: 1 via OPHTHALMIC

## 2018-01-10 MED ORDER — DEXMEDETOMIDINE HCL IN NACL 400 MCG/100ML IV SOLN
0.0000 ug/kg/h | INTRAVENOUS | Status: DC
  Administered 2018-01-10: 0.7 ug/kg/h via INTRAVENOUS
  Administered 2018-01-10: 0.5 ug/kg/h via INTRAVENOUS
  Filled 2018-01-10: qty 100

## 2018-01-10 MED ORDER — LEVOFLOXACIN IN D5W 750 MG/150ML IV SOLN
750.0000 mg | INTRAVENOUS | Status: AC
  Administered 2018-01-11: 750 mg via INTRAVENOUS
  Filled 2018-01-10: qty 150

## 2018-01-10 MED ORDER — SODIUM CHLORIDE 0.9% FLUSH: 3.0000 mL | INTRAVENOUS | Status: DC | PRN

## 2018-01-10 MED ORDER — ACETAMINOPHEN 500 MG PO TABS
1000.0000 mg | ORAL_TABLET | Freq: Four times a day (QID) | ORAL | Status: DC
  Administered 2018-01-11 – 2018-01-13 (×10): 1000 mg via ORAL
  Filled 2018-01-10 (×10): qty 2

## 2018-01-10 MED ORDER — MILRINONE LACTATE IN DEXTROSE 20-5 MG/100ML-% IV SOLN
0.3000 ug/kg/min | INTRAVENOUS | Status: DC
  Administered 2018-01-10 – 2018-01-11 (×2): 0.3 ug/kg/min via INTRAVENOUS
  Filled 2018-01-10: qty 100

## 2018-01-10 MED ORDER — METOPROLOL TARTRATE 25 MG/10 ML ORAL SUSPENSION: 12.5000 mg | Freq: Two times a day (BID) | ORAL | Status: DC

## 2018-01-10 MED ORDER — SODIUM CHLORIDE 0.9% FLUSH
10.0000 mL | Freq: Two times a day (BID) | INTRAVENOUS | Status: DC
  Administered 2018-01-10: 10 mL
  Administered 2018-01-11 – 2018-01-12 (×2): 20 mL
  Administered 2018-01-12: 10 mL

## 2018-01-10 MED ORDER — LACTATED RINGERS IV SOLN
500.0000 mL | Freq: Once | INTRAVENOUS | Status: AC | PRN
  Administered 2018-01-10: 500 mL via INTRAVENOUS

## 2018-01-10 MED ORDER — BISACODYL 10 MG RE SUPP: 10.0000 mg | Freq: Every day | RECTAL | Status: DC

## 2018-01-10 MED ORDER — ACETAMINOPHEN 650 MG RE SUPP
650.0000 mg | Freq: Once | RECTAL | Status: AC
  Administered 2018-01-10: 650 mg via RECTAL

## 2018-01-10 MED ORDER — SODIUM CHLORIDE 0.45 % IV SOLN: INTRAVENOUS | Status: DC | PRN

## 2018-01-10 MED ORDER — LACTATED RINGERS IV SOLN
INTRAVENOUS | Status: DC
  Administered 2018-01-10: 14:00:00 via INTRAVENOUS

## 2018-01-10 MED ORDER — PROTAMINE SULFATE 10 MG/ML IV SOLN
INTRAVENOUS | Status: DC | PRN
  Administered 2018-01-10: 200 mg via INTRAVENOUS

## 2018-01-10 MED ORDER — FENTANYL CITRATE (PF) 250 MCG/5ML IJ SOLN
INTRAMUSCULAR | Status: AC
  Filled 2018-01-10: qty 25

## 2018-01-10 MED ORDER — DOCUSATE SODIUM 100 MG PO CAPS
200.0000 mg | ORAL_CAPSULE | Freq: Every day | ORAL | Status: DC
  Administered 2018-01-11 – 2018-01-12 (×2): 200 mg via ORAL
  Filled 2018-01-10 (×2): qty 2

## 2018-01-10 MED ORDER — TRAMADOL HCL 50 MG PO TABS
50.0000 mg | ORAL_TABLET | ORAL | Status: DC | PRN
  Administered 2018-01-11: 100 mg via ORAL
  Administered 2018-01-12: 50 mg via ORAL
  Administered 2018-01-13: 100 mg via ORAL
  Filled 2018-01-10 (×2): qty 2
  Filled 2018-01-10: qty 1

## 2018-01-10 MED ORDER — SODIUM CHLORIDE 0.9% FLUSH: 10.0000 mL | INTRAVENOUS | Status: DC | PRN

## 2018-01-10 MED ORDER — PROPOFOL 10 MG/ML IV BOLUS
INTRAVENOUS | Status: AC
  Filled 2018-01-10: qty 20

## 2018-01-10 MED ORDER — DEXAMETHASONE SODIUM PHOSPHATE 10 MG/ML IJ SOLN
INTRAMUSCULAR | Status: AC
  Filled 2018-01-10: qty 1

## 2018-01-10 MED ORDER — ONDANSETRON HCL 4 MG/2ML IJ SOLN
4.0000 mg | Freq: Four times a day (QID) | INTRAMUSCULAR | Status: DC | PRN
  Administered 2018-01-10 – 2018-01-11 (×3): 4 mg via INTRAVENOUS
  Filled 2018-01-10 (×3): qty 2

## 2018-01-10 MED ORDER — INSULIN ASPART 100 UNIT/ML ~~LOC~~ SOLN
0.0000 [IU] | SUBCUTANEOUS | Status: DC
  Administered 2018-01-10: 8 [IU] via SUBCUTANEOUS
  Administered 2018-01-11: 2 [IU] via SUBCUTANEOUS

## 2018-01-10 MED ORDER — LACTATED RINGERS IV SOLN
INTRAVENOUS | Status: DC
  Administered 2018-01-10: 17:00:00 via INTRAVENOUS

## 2018-01-10 MED FILL — Magnesium Sulfate Inj 50%: INTRAMUSCULAR | Qty: 10 | Status: AC

## 2018-01-10 MED FILL — Potassium Chloride Inj 2 mEq/ML: INTRAVENOUS | Qty: 40 | Status: AC

## 2018-01-10 MED FILL — Heparin Sodium (Porcine) Inj 1000 Unit/ML: INTRAMUSCULAR | Qty: 30 | Status: AC

## 2018-01-10 SURGICAL SUPPLY — 73 items
ADH SKN CLS APL DERMABOND .7 (GAUZE/BANDAGES/DRESSINGS) ×2
BAG DECANTER FOR FLEXI CONT (MISCELLANEOUS) ×3 IMPLANT
BANDAGE ACE 4X5 VEL STRL LF (GAUZE/BANDAGES/DRESSINGS) ×3 IMPLANT
BANDAGE ACE 6X5 VEL STRL LF (GAUZE/BANDAGES/DRESSINGS) ×3 IMPLANT
BANDAGE ELASTIC 6 VELCRO ST LF (GAUZE/BANDAGES/DRESSINGS) ×1 IMPLANT
BLADE STERNUM SYSTEM 6 (BLADE) ×3 IMPLANT
BLADE SURG 11 STRL SS (BLADE) ×1 IMPLANT
BNDG GAUZE ELAST 4 BULKY (GAUZE/BANDAGES/DRESSINGS) ×3 IMPLANT
CANISTER SUCT 3000ML PPV (MISCELLANEOUS) ×3 IMPLANT
CATH CPB KIT GERHARDT (MISCELLANEOUS) ×3 IMPLANT
CATH THORACIC 28FR (CATHETERS) ×3 IMPLANT
COVER MAYO STAND STRL (DRAPES) ×1 IMPLANT
CRADLE DONUT ADULT HEAD (MISCELLANEOUS) ×3 IMPLANT
DERMABOND ADVANCED (GAUZE/BANDAGES/DRESSINGS) ×1
DERMABOND ADVANCED .7 DNX12 (GAUZE/BANDAGES/DRESSINGS) IMPLANT
DRAIN CHANNEL 28F RND 3/8 FF (WOUND CARE) ×3 IMPLANT
DRAPE CARDIOVASCULAR INCISE (DRAPES) ×3
DRAPE SLUSH/WARMER DISC (DRAPES) ×3 IMPLANT
DRAPE SRG 135X102X78XABS (DRAPES) ×2 IMPLANT
DRSG AQUACEL AG ADV 3.5X14 (GAUZE/BANDAGES/DRESSINGS) ×3 IMPLANT
ELECT BLADE 4.0 EZ CLEAN MEGAD (MISCELLANEOUS) ×3
ELECT REM PT RETURN 9FT ADLT (ELECTROSURGICAL) ×6
ELECTRODE BLDE 4.0 EZ CLN MEGD (MISCELLANEOUS) ×2 IMPLANT
ELECTRODE REM PT RTRN 9FT ADLT (ELECTROSURGICAL) ×4 IMPLANT
FELT TEFLON 1X6 (MISCELLANEOUS) ×6 IMPLANT
GAUZE SPONGE 4X4 12PLY STRL (GAUZE/BANDAGES/DRESSINGS) ×6 IMPLANT
GAUZE SPONGE 4X4 12PLY STRL LF (GAUZE/BANDAGES/DRESSINGS) ×2 IMPLANT
GLOVE BIO SURGEON STRL SZ 6.5 (GLOVE) ×10 IMPLANT
GOWN STRL REUS W/ TWL LRG LVL3 (GOWN DISPOSABLE) ×13 IMPLANT
GOWN STRL REUS W/TWL LRG LVL3 (GOWN DISPOSABLE) ×27
HEMOSTAT POWDER SURGIFOAM 1G (HEMOSTASIS) ×9 IMPLANT
HEMOSTAT SURGICEL 2X14 (HEMOSTASIS) ×3 IMPLANT
KIT BASIN OR (CUSTOM PROCEDURE TRAY) ×3 IMPLANT
KIT CATH SUCT 8FR (CATHETERS) ×4 IMPLANT
KIT SUCTION CATH 14FR (SUCTIONS) ×7 IMPLANT
KIT TURNOVER KIT B (KITS) ×3 IMPLANT
KIT VASOVIEW HEMOPRO VH 3000 (KITS) ×3 IMPLANT
LEAD PACING MYOCARDI (MISCELLANEOUS) ×3 IMPLANT
MARKER GRAFT CORONARY BYPASS (MISCELLANEOUS) ×9 IMPLANT
NS IRRIG 1000ML POUR BTL (IV SOLUTION) ×15 IMPLANT
PACK E OPEN HEART (SUTURE) ×3 IMPLANT
PACK OPEN HEART (CUSTOM PROCEDURE TRAY) ×3 IMPLANT
PAD ARMBOARD 7.5X6 YLW CONV (MISCELLANEOUS) ×6 IMPLANT
PAD ELECT DEFIB RADIOL ZOLL (MISCELLANEOUS) ×3 IMPLANT
PENCIL BUTTON HOLSTER BLD 10FT (ELECTRODE) ×3 IMPLANT
PUNCH AORTIC ROT 4.0MM RCL 40 (MISCELLANEOUS) ×1 IMPLANT
SET CARDIOPLEGIA MPS 5001102 (MISCELLANEOUS) ×1 IMPLANT
SPONGE LAP 18X18 X RAY DECT (DISPOSABLE) ×6 IMPLANT
SUT BONE WAX W31G (SUTURE) ×3 IMPLANT
SUT MNCRL AB 4-0 PS2 18 (SUTURE) ×2 IMPLANT
SUT PROLENE 3 0 SH1 36 (SUTURE) ×3 IMPLANT
SUT PROLENE 4 0 TF (SUTURE) ×6 IMPLANT
SUT PROLENE 6 0 C 1 30 (SUTURE) ×2 IMPLANT
SUT PROLENE 6 0 CC (SUTURE) ×6 IMPLANT
SUT PROLENE 7 0 BV 1 (SUTURE) ×1 IMPLANT
SUT PROLENE 7 0 BV1 MDA (SUTURE) ×5 IMPLANT
SUT PROLENE 8 0 BV175 6 (SUTURE) ×4 IMPLANT
SUT SILK  1 MH (SUTURE) ×1
SUT SILK 1 MH (SUTURE) IMPLANT
SUT STEEL 6MS V (SUTURE) ×3 IMPLANT
SUT STEEL SZ 6 DBL 3X14 BALL (SUTURE) ×3 IMPLANT
SUT VIC AB 1 CTX 18 (SUTURE) ×6 IMPLANT
SUT VIC AB 2-0 CT1 27 (SUTURE) ×3
SUT VIC AB 2-0 CT1 TAPERPNT 27 (SUTURE) IMPLANT
SYSTEM SAHARA CHEST DRAIN ATS (WOUND CARE) ×3 IMPLANT
TAPE CLOTH SURG 6X10 WHT LF (GAUZE/BANDAGES/DRESSINGS) ×1 IMPLANT
TAPE PAPER 1X10 WHT MICROPORE (GAUZE/BANDAGES/DRESSINGS) ×1 IMPLANT
TOWEL GREEN STERILE (TOWEL DISPOSABLE) ×3 IMPLANT
TOWEL GREEN STERILE FF (TOWEL DISPOSABLE) ×3 IMPLANT
TRAY FOLEY SLVR 16FR TEMP STAT (SET/KITS/TRAYS/PACK) ×3 IMPLANT
TUBING INSUFFLATION (TUBING) ×3 IMPLANT
UNDERPAD 30X30 (UNDERPADS AND DIAPERS) ×3 IMPLANT
WATER STERILE IRR 1000ML POUR (IV SOLUTION) ×6 IMPLANT

## 2018-01-10 NOTE — Brief Op Note (Signed)
      [address]       [place] [id number]             [phone]     01/10/2018  3:07 PM  PATIENT:  Diana MuscaPamela M [name]  62 y.o. female  PRE-OPERATIVE DIAGNOSIS:  CAD  POST-OPERATIVE DIAGNOSIS:  CAD  PROCEDURE:  TRANSESOPHAGEAL ECHOCARDIOGRAM (TEE),  MEDIAN STERNOTOMY for CORONARY ARTERY BYPASS GRAFTING (CABG) TIMES 4  (LEFT INTERNAL MAMMARY ARTERY TO OM1,  RIGHT GREATER SAPHENOUS VEIN TO DIAGONAL, AND SEQUENTIALLY TO OM2 AND DISTAL CIRC) using RIGHT GREATER SAPHENOUS VEIN  ENDOSCOPICALLY HARVESTED  SURGEON:  Surgeon(s) and Role:    Delight OvensGerhardt, Timouthy Gilardi B, MD - Primary  PHYSICIAN ASSISTANT: Doree Fudgeonielle Zimmerman PA-C  ASSISTANTS: Benay SpiceMarie Irwin RNFA  ANESTHESIA:   general  EBL:  900 mL   PRODUCTS GIVEN: 2 units PRBCs  DRAINS: Chest tubes placed in the mediastinal and pleural spaces   COUNTS CORRECT:  YES  DICTATION: .Dragon Dictation  PLAN OF CARE: Admit to inpatient   PATIENT DISPOSITION:  ICU - intubated and hemodynamically stable.   Delay start of Pharmacological VTE agent (>24hrs) due to surgical blood loss or risk of bleeding: yes  BASELINE WEIGHT: 61.2 kg

## 2018-01-10 NOTE — OR Nursing (Signed)
1st sicu call to nicole at 12:30 2nd sicu call to nicole at 12:54 3rd sucy call to nicole at 13:27

## 2018-01-10 NOTE — Anesthesia Procedure Notes (Signed)
Central Venous Catheter Insertion Performed by: Roderic Palau, MD, anesthesiologist Start/End8/13/2019 6:35 AM, 01/10/2018 6:50 AM Patient location: Pre-op. Preanesthetic checklist: patient identified, IV checked, site marked, risks and benefits discussed, surgical consent, monitors and equipment checked, pre-op evaluation, timeout performed and anesthesia consent Position: Trendelenburg Lidocaine 1% used for infiltration and patient sedated Hand hygiene performed , maximum sterile barriers used  and Seldinger technique used Catheter size: 9 Fr Total catheter length 10. Central line was placed.MAC introducer Swan type:thermodilution Procedure performed using ultrasound guided technique. Ultrasound Notes:anatomy identified, needle tip was noted to be adjacent to the nerve/plexus identified, no ultrasound evidence of intravascular and/or intraneural injection and image(s) printed for medical record Attempts: 1 Following insertion, line sutured, dressing applied and Biopatch. Post procedure assessment: blood return through all ports, free fluid flow and no air  Patient tolerated the procedure well with no immediate complications.

## 2018-01-10 NOTE — Progress Notes (Signed)
RT NOTES: Rapid wean protocol initiated 

## 2018-01-10 NOTE — Procedures (Signed)
Extubation Procedure Note  Patient Details:   Name: Patience Muscaamela M Tonkovich DOB: 10/10/1955 MRN: [id number]   Airway Documentation:   Patient extubated per protocol. NIF -22 VC 0.8L. Patient had positive cuff leak prior to extubation. Placed on 4lpm humidified oxygen. Patient had strong cough and able to voice. RN to instruct on IS after mouth care. RN at bedside. Vent end date: 01/10/18 Vent end time: 1849   Evaluation  O2 sats: stable throughout Complications: No apparent complications Patient did tolerate procedure well. Bilateral Breath Sounds: Clear, Diminished   Yes  Suszanne ConnersLaura P Juliane Guest 01/10/2018, 6:50 PM

## 2018-01-10 NOTE — Transfer of Care (Signed)
Immediate Anesthesia Transfer of Care Note  Patient: Diana [name]  Procedure(s) Performed: CORONARY ARTERY BYPASS GRAFTING (CABG) TIMES 4, USING LEFT INTERNAL MAMMARY ARTERY TO OM1  AND ENDOSCOPICALLY HARVESTED RIGHT SAPHENOUS VEIN TO DIAGONAL, AND SEQUENTIALLY TO OM2 AND DISTAL CIRCUMFLEX (N/A Chest) TRANSESOPHAGEAL ECHOCARDIOGRAM (TEE) (N/A )  Patient Location: SICU  Anesthesia Type:General  Level of Consciousness: sedated and Patient remains intubated per anesthesia plan  Airway & Oxygen Therapy: Patient remains intubated per anesthesia plan and Patient placed on Ventilator (see vital sign flow sheet for setting)  Post-op Assessment: Report given to RN and Post -op Vital signs reviewed and stable  Post vital signs: Reviewed and stable  Last Vitals:  Vitals Value Taken Time  BP 81/63 01/10/2018  1:45 PM  Temp    Pulse 89 01/10/2018  1:46 PM  Resp 11 01/10/2018  1:46 PM  SpO2 100 % 01/10/2018  1:46 PM  Vitals shown include unvalidated device data.  Last Pain:  Vitals:   01/10/18 0517  TempSrc: Oral  PainSc:       Patients Stated Pain Goal: 0 (01/09/18 2030)  Complications: No apparent anesthesia complications

## 2018-01-10 NOTE — Progress Notes (Signed)
      [address]       [place] [id number]             [phone]      Intubated, some agitation while awaiting extubation  BP (!) 122/94   Pulse 90   Temp (!) 97 F (36.1 C)   Resp 18   Ht 5\' 3"  (1.6 m)   Wt 61.2 kg   SpO2 100%   BMI 23.91 kg/m   PA 24/18, CI 1.63- likely artificially low due to dyssynchrony with vent currently  Intake/Output Summary (Last 24 hours) at 01/10/2018 1844 Last data filed at 01/10/2018 1800 Gross per 24 hour  Intake 4026.85 ml  Output 5350 ml  Net -1323.15 ml   Will follow BP post extubation, likely needs additional volume  Viviann SpareSteven C. Dorris FetchHendrickson, MD [facility] [phone]

## 2018-01-10 NOTE — Anesthesia Procedure Notes (Signed)
Procedure Name: Intubation Date/Time: 01/10/2018 7:39 AM Performed by: Hart RobinsonsByrd, Cobie Marcoux R, CRNA Pre-anesthesia Checklist: Patient identified, Emergency Drugs available, Suction available, Patient being monitored and Timeout performed Patient Re-evaluated:Patient Re-evaluated prior to induction Oxygen Delivery Method: Circle system utilized Preoxygenation: Pre-oxygenation with 100% oxygen Induction Type: IV induction Ventilation: Mask ventilation without difficulty Laryngoscope Size: Miller and 2 Grade View: Grade I Tube size: 7.5 mm Number of attempts: 1 Airway Equipment and Method: Stylet Placement Confirmation: ETT inserted through vocal cords under direct vision,  positive ETCO2,  CO2 detector and breath sounds checked- equal and bilateral Secured at: 21 cm Tube secured with: Tape Dental Injury: Teeth and Oropharynx as per pre-operative assessment  Comments: No change in dentition - Cspine neutrality maintained

## 2018-01-10 NOTE — Progress Notes (Signed)
Patient ID: Diana [name], female   DOB: 06/20/1955, 62 y.o.   MRN: [id number]      [address]       [place] [id number]             [phone]     Pre Procedure note for inpatients:   Diana Muscaamela M Coe has been scheduled for Procedure(s): CORONARY ARTERY BYPASS GRAFTING (CABG) (N/A) TRANSESOPHAGEAL ECHOCARDIOGRAM (TEE) (N/A) today. The various methods of treatment have been discussed with the patient. After consideration of the risks, benefits and treatment options the patient has consented to the planned procedure.   The patient has been seen and labs reviewed. There are no changes in the patient's condition to prevent proceeding with the planned procedure today.  Recent labs:  Lab Results  Component Value Date   WBC 5.8 01/10/2018   HGB 9.2 (L) 01/10/2018   HCT 29.7 (L) 01/10/2018   PLT 181 01/10/2018   GLUCOSE 106 (H) 01/10/2018   CHOL 176 10/31/2016   TRIG 49 10/31/2016   HDL 59 10/31/2016   LDLCALC 107 (H) 10/31/2016   ALT 22 01/06/2018   AST 22 01/06/2018   NA 137 01/10/2018   K 3.7 01/10/2018   CL 103 01/10/2018   CREATININE 0.71 01/10/2018   BUN 16 01/10/2018   CO2 26 01/10/2018   TSH 0.66 01/29/2016   INR 0.99 01/06/2018   HGBA1C 5.6 01/09/2018    Delight OvensEdward B Saba Gomm, MD 01/10/2018 7:06 AM

## 2018-01-10 NOTE — Anesthesia Procedure Notes (Signed)
Central Venous Catheter Insertion Performed by: Gaynelle AduFitzgerald, Eden Toohey, MD, anesthesiologist Start/End8/13/2019 6:35 AM, 01/10/2018 6:50 AM Patient location: Pre-op. Preanesthetic checklist: patient identified, IV checked, site marked, risks and benefits discussed, surgical consent, monitors and equipment checked, pre-op evaluation, timeout performed and anesthesia consent Hand hygiene performed  and maximum sterile barriers used  PA cath was placed.Swan type:thermodilution PA Cath depth:45 Procedure performed without using ultrasound guided technique. Attempts: 1 Patient tolerated the procedure well with no immediate complications.

## 2018-01-10 NOTE — Anesthesia Procedure Notes (Signed)
Arterial Line Insertion Start/End8/13/2019 7:05 AM Performed by: Shelton SilvasHollis, Kevin D, MD, Quentin OreWalker, Luz E, CRNA, CRNA  Patient location: Pre-op. Preanesthetic checklist: patient identified, IV checked, site marked, risks and benefits discussed, surgical consent, monitors and equipment checked, pre-op evaluation, timeout performed and anesthesia consent Lidocaine 1% used for infiltration and patient sedated Left, radial was placed Catheter size: 20 G Hand hygiene performed  and maximum sterile barriers used  Allen's test indicative of satisfactory collateral circulation Attempts: 1 Procedure performed without using ultrasound guided technique. Following insertion, Biopatch and dressing applied. Post procedure assessment: normal  Patient tolerated the procedure well with no immediate complications.

## 2018-01-11 ENCOUNTER — Encounter (HOSPITAL_COMMUNITY): Payer: Self-pay | Admitting: Cardiothoracic Surgery

## 2018-01-11 ENCOUNTER — Inpatient Hospital Stay (HOSPITAL_COMMUNITY): Payer: BLUE CROSS/BLUE SHIELD

## 2018-01-11 LAB — CBC
HCT: 32 % — ABNORMAL LOW (ref 36.0–46.0)
HEMATOCRIT: 36.6 % (ref 36.0–46.0)
HEMOGLOBIN: 11.6 g/dL — AB (ref 12.0–15.0)
Hemoglobin: 10.4 g/dL — ABNORMAL LOW (ref 12.0–15.0)
MCH: 26.8 pg (ref 26.0–34.0)
MCH: 26.8 pg (ref 26.0–34.0)
MCHC: 32.5 g/dL (ref 30.0–36.0)
MCHC: 31.7 g/dL (ref 30.0–36.0)
MCV: 82.5 fL (ref 78.0–100.0)
MCV: 84.5 fL (ref 78.0–100.0)
PLATELETS: 85 10*3/uL — AB (ref 150–400)
Platelets: 77 10*3/uL — ABNORMAL LOW (ref 150–400)
RBC: 3.88 MIL/uL (ref 3.87–5.11)
RBC: 4.33 MIL/uL (ref 3.87–5.11)
RDW: 14.5 % (ref 11.5–15.5)
RDW: 15.1 % (ref 11.5–15.5)
WBC: 10.9 10*3/uL — ABNORMAL HIGH (ref 4.0–10.5)
WBC: 13.1 10*3/uL — AB (ref 4.0–10.5)

## 2018-01-11 LAB — GLUCOSE, CAPILLARY
GLUCOSE-CAPILLARY: 106 mg/dL — AB (ref 70–99)
GLUCOSE-CAPILLARY: 108 mg/dL — AB (ref 70–99)
GLUCOSE-CAPILLARY: 117 mg/dL — AB (ref 70–99)
GLUCOSE-CAPILLARY: 121 mg/dL — AB (ref 70–99)
Glucose-Capillary: 129 mg/dL — ABNORMAL HIGH (ref 70–99)
Glucose-Capillary: 140 mg/dL — ABNORMAL HIGH (ref 70–99)

## 2018-01-11 LAB — BASIC METABOLIC PANEL
Anion gap: 8 (ref 5–15)
BUN: 9 mg/dL (ref 6–20)
CO2: 23 mmol/L (ref 22–32)
CREATININE: 0.5 mg/dL (ref 0.44–1.00)
Calcium: 8.6 mg/dL — ABNORMAL LOW (ref 8.9–10.3)
Chloride: 105 mmol/L (ref 98–111)
GFR calc Af Amer: >60 mL/min (ref >60)
GLUCOSE: 118 mg/dL — AB (ref 70–99)
Potassium: 3.9 mmol/L (ref 3.5–5.1)
SODIUM: 136 mmol/L (ref 135–145)

## 2018-01-11 LAB — POCT I-STAT, CHEM 8
BUN: 12 mg/dL (ref 6–20)
CREATININE: 0.4 mg/dL — AB (ref 0.44–1.00)
Calcium, Ion: 1.24 mmol/L (ref 1.15–1.40)
Chloride: 99 mmol/L (ref 98–111)
GLUCOSE: 126 mg/dL — AB (ref 70–99)
HEMATOCRIT: 33 % — AB (ref 36.0–46.0)
HEMOGLOBIN: 11.2 g/dL — AB (ref 12.0–15.0)
POTASSIUM: 3.9 mmol/L (ref 3.5–5.1)
Sodium: 137 mmol/L (ref 135–145)
TCO2: 26 mmol/L (ref 22–32)

## 2018-01-11 LAB — MAGNESIUM
MAGNESIUM: 2.1 mg/dL (ref 1.7–2.4)
Magnesium: 1.9 mg/dL (ref 1.7–2.4)

## 2018-01-11 LAB — CREATININE, SERUM
Creatinine, Ser: 0.57 mg/dL (ref 0.44–1.00)
GFR calc Af Amer: >60 mL/min (ref >60)

## 2018-01-11 IMAGING — DX DG CHEST 1V PORT
1 series · 1 of 1 positions shown · non-contrast
Comparison: Radiograph [DATE].

CLINICAL DATA: Status post coronary bypass graft.

EXAM:
PORTABLE CHEST 1 VIEW

[chest]
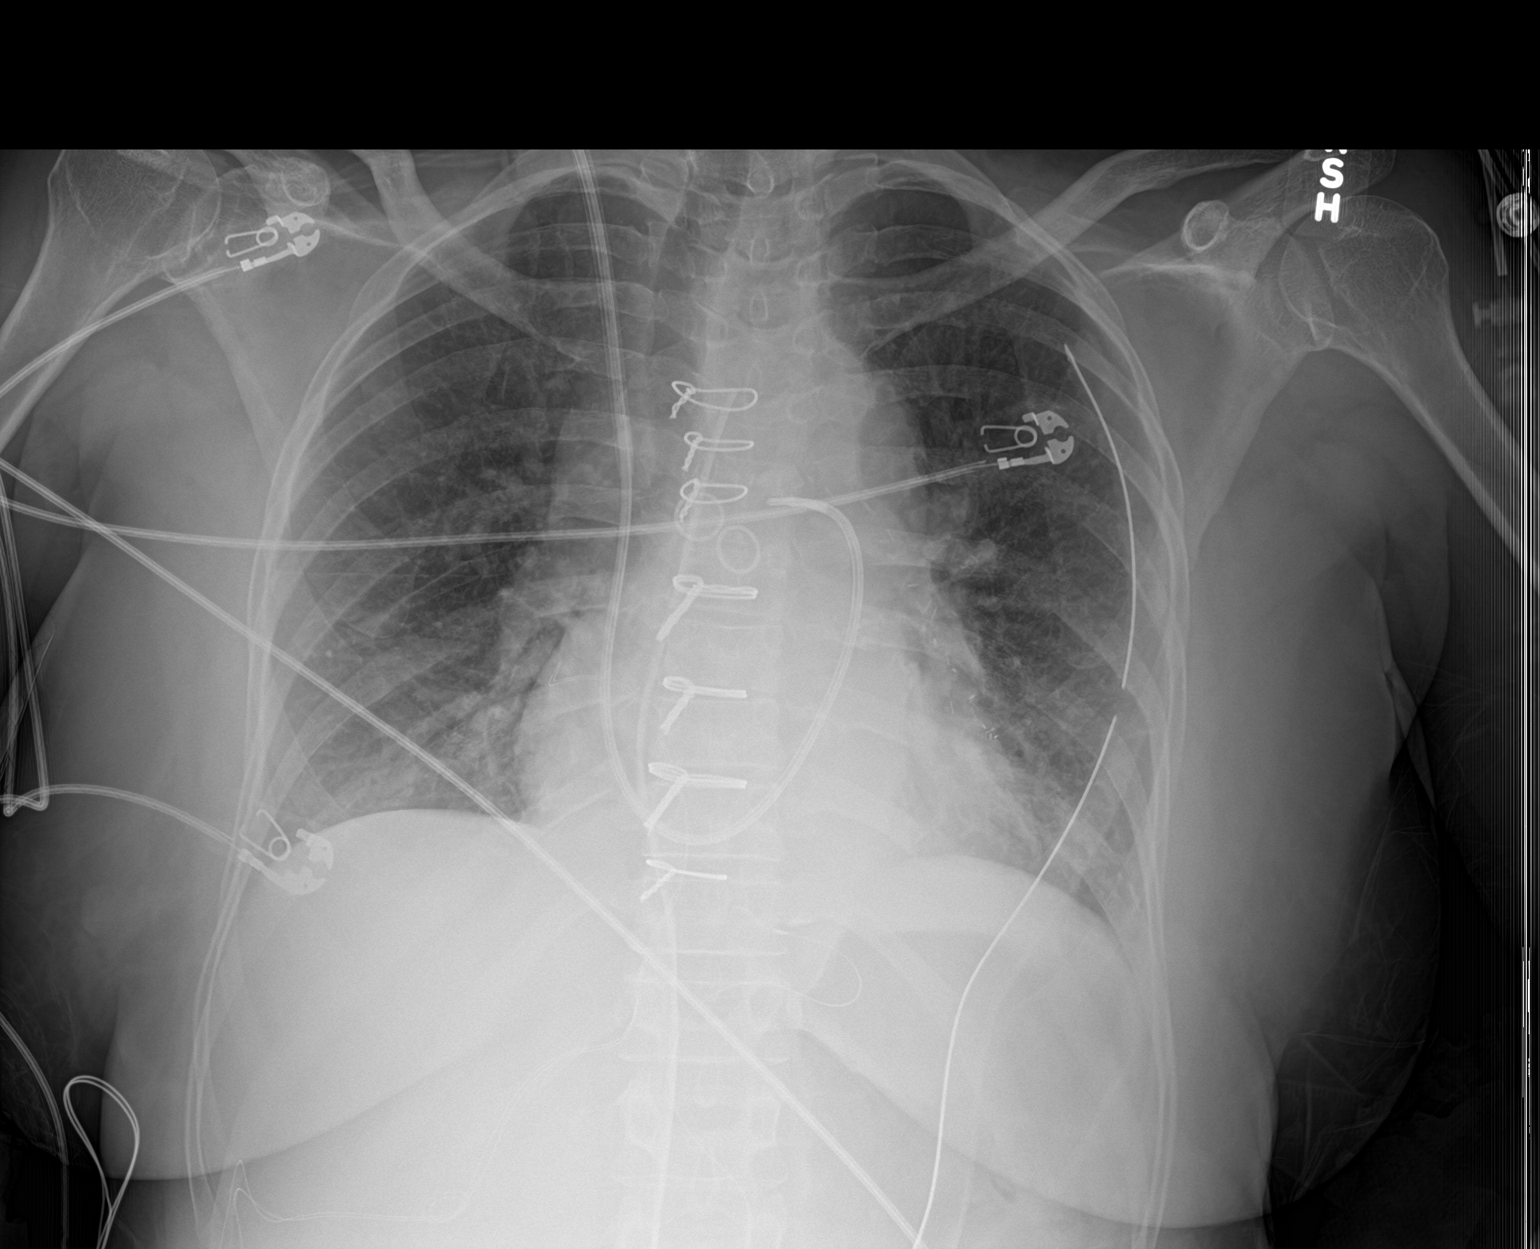

[1 of 1 positions shown; findings below may reference images not displayed]

FINDINGS: Stable cardiomediastinal silhouette. Endotracheal and nasogastric
tubes have been removed. Left-sided chest tube is noted without
pneumothorax. Right internal jugular Swan-Ganz catheter is noted
with tip directed into right pulmonary artery. No pneumothorax or
pleural effusion is noted. Stable mild bibasilar subsegmental
atelectasis. Bony thorax is unremarkable.
IMPRESSION: Endotracheal and nasogastric tubes have been removed. Left-sided
chest tube is unchanged without pneumothorax. Mild bibasilar
subsegmental atelectasis is noted.

## 2018-01-11 MED ORDER — METOPROLOL TARTRATE 25 MG PO TABS
25.0000 mg | ORAL_TABLET | Freq: Two times a day (BID) | ORAL | Status: DC
  Administered 2018-01-11 – 2018-01-12 (×4): 25 mg via ORAL
  Filled 2018-01-11 (×4): qty 1

## 2018-01-11 MED ORDER — LISINOPRIL 2.5 MG PO TABS
2.5000 mg | ORAL_TABLET | Freq: Every day | ORAL | Status: DC
  Administered 2018-01-11 – 2018-01-17 (×7): 2.5 mg via ORAL
  Filled 2018-01-11 (×7): qty 1

## 2018-01-11 MED ORDER — METOPROLOL TARTRATE 25 MG/10 ML ORAL SUSPENSION: 25.0000 mg | Freq: Two times a day (BID) | ORAL | Status: DC

## 2018-01-11 MED ORDER — PROMETHAZINE HCL 25 MG/ML IJ SOLN
6.2500 mg | Freq: Four times a day (QID) | INTRAMUSCULAR | Status: DC | PRN
  Administered 2018-01-11: 6.25 mg via INTRAVENOUS
  Filled 2018-01-11: qty 1

## 2018-01-11 MED ORDER — INSULIN ASPART 100 UNIT/ML ~~LOC~~ SOLN
0.0000 [IU] | SUBCUTANEOUS | Status: DC
  Administered 2018-01-11 – 2018-01-13 (×6): 2 [IU] via SUBCUTANEOUS

## 2018-01-11 MED ORDER — METOCLOPRAMIDE HCL 5 MG/ML IJ SOLN
10.0000 mg | Freq: Three times a day (TID) | INTRAMUSCULAR | Status: AC
  Administered 2018-01-11 – 2018-01-12 (×3): 10 mg via INTRAVENOUS
  Filled 2018-01-11 (×3): qty 2

## 2018-01-11 MED ORDER — POTASSIUM CHLORIDE CRYS ER 20 MEQ PO TBCR
20.0000 meq | EXTENDED_RELEASE_TABLET | Freq: Once | ORAL | Status: AC
  Administered 2018-01-11: 20 meq via ORAL
  Filled 2018-01-11: qty 1

## 2018-01-11 MED ORDER — FUROSEMIDE 10 MG/ML IJ SOLN
20.0000 mg | Freq: Once | INTRAMUSCULAR | Status: AC
  Administered 2018-01-11: 20 mg via INTRAVENOUS
  Filled 2018-01-11: qty 2

## 2018-01-11 NOTE — Progress Notes (Signed)
TCTS DAILY ICU PROGRESS NOTE                   [address]            [place] [id number]          [phone]   1 Day Post-Op Procedure(s) (LRB): CORONARY ARTERY BYPASS GRAFTING (CABG) TIMES 4, USING LEFT INTERNAL MAMMARY ARTERY TO OM1  AND ENDOSCOPICALLY HARVESTED RIGHT SAPHENOUS VEIN TO DIAGONAL, AND SEQUENTIALLY TO OM2 AND DISTAL CIRCUMFLEX (N/A) TRANSESOPHAGEAL ECHOCARDIOGRAM (TEE) (N/A)  Total Length of Stay:  LOS: 6 days   Subjective: Patient awake and alert this am. She no specific complaints.  Objective: Vital signs in last 24 hours: Temp:  [94.8 F (34.9 C)-101.7 F (38.7 C)] 100.2 F (37.9 C) (08/14 0800) Pulse Rate:  [85-106] 105 (08/14 0800) Cardiac Rhythm: Normal sinus rhythm (08/14 0800) Resp:  [12-31] 27 (08/14 0800) BP: (89-163)/(61-111) 113/73 (08/14 0800) SpO2:  [95 %-100 %] 99 % (08/14 0800) Arterial Line BP: (91-172)/(47-101) 136/53 (08/14 0800) FiO2 (%):  [40 %-50 %] 40 % (08/13 1755) Weight:  [65.6 kg] 65.6 kg (08/14 0600)  Filed Weights   01/09/18 0352 01/10/18 0517 01/11/18 0600  Weight: 60.6 kg 61.2 kg 65.6 kg    Weight change: 4.364 kg   Hemodynamic parameters for last 24 hours: PAP: (13-32)/(3-21) 15/7 CO:  [2.7 L/min-5.5 L/min] 4.4 L/min CI:  [1.6 L/min/m2-3.4 L/min/m2] 2.7 L/min/m2  Intake/Output from previous day: 08/13 0701 - 08/14 0700 In: 4636.1 [I.V.:3243.5; Blood:650; IV Piggyback:742.6] Out: 7460 [Urine:6140; Blood:900; Chest Tube:420]  Intake/Output this shift: Total I/O In: -  Out: 60 [Urine:60]  Current Meds: Scheduled Meds: . acetaminophen  1,000 mg Oral Q6H   Or  . acetaminophen (TYLENOL) oral liquid 160 mg/5 mL  1,000 mg Per Tube Q6H  . aspirin EC  325 mg Oral Daily   Or  . aspirin  324 mg Per Tube Daily  . atorvastatin  80 mg Oral q1800  . bisacodyl  10 mg Oral Daily   Or  . bisacodyl  10 mg Rectal Daily  . Chlorhexidine Gluconate Cloth  6 each Topical Daily  . docusate sodium  200 mg Oral  Daily  . insulin aspart  0-24 Units Subcutaneous Q4H  . lisinopril  2.5 mg Oral Daily  . mouth rinse  15 mL Mouth Rinse BID  . metoprolol tartrate  25 mg Oral BID   Or  . metoprolol tartrate  25 mg Per Tube BID  . [START ON 01/12/2018] pantoprazole  40 mg Oral Daily  . potassium chloride  20 mEq Oral Once  . pregabalin  75 mg Oral BID  . sodium chloride flush  10-40 mL Intracatheter Q12H  . sodium chloride flush  3 mL Intravenous Q12H   Continuous Infusions: . sodium chloride 20 mL/hr at 01/11/18 0700  . sodium chloride    . sodium chloride 10 mL/hr at 01/10/18 1408  . albumin human 250 mL (01/10/18 1802)  . lactated ringers 20 mL/hr at 01/10/18 1417  . lactated ringers 20 mL/hr at 01/10/18 1640  . levofloxacin (LEVAQUIN) IV    . milrinone 0.3 mcg/kg/min (01/11/18 0700)  . nitroGLYCERIN Stopped (01/10/18 2112)  . phenylephrine (NEO-SYNEPHRINE) Adult infusion Stopped (01/11/18 0015)   PRN Meds:.sodium chloride, albumin human, ALPRAZolam, hydrALAZINE, metoprolol tartrate, midazolam, morphine injection, ondansetron (ZOFRAN) IV, sodium chloride flush, sodium chloride flush, traMADol  General appearance: alert, cooperative and no distress Neurologic: intact Heart: Slightly tachycardic Lungs: Diminshed at bases  Abdomen: Soft, non tender, sporadic bowel sounds present Extremities: Mild RLE edema;SCD LLE Wound: RLE wounds are clean and dry. Aquacel intact  Lab Results: CBC: Recent Labs    01/10/18 2009 01/10/18 2013 01/11/18 0354  WBC 12.6*  --  10.9*  HGB 10.4* 10.9* 10.4*  HCT 31.9* 32.0* 32.0*  PLT 89*  --  85*   BMET:  Recent Labs    01/10/18 0237  01/10/18 2013 01/11/18 0354  NA 137   < > 139 136  K 3.7   < > 4.7 3.9  CL 103   < > 104 105  CO2 26  --   --  23  GLUCOSE 106*   < > 210* 118*  BUN 16   < > 10 9  CREATININE 0.71   < > 0.40* 0.50  CALCIUM 8.8*  --   --  8.6*   < > = values in this interval not displayed.    CMET: Lab Results  Component Value  Date   WBC 10.9 (H) 01/11/2018   HGB 10.4 (L) 01/11/2018   HCT 32.0 (L) 01/11/2018   PLT 85 (L) 01/11/2018   GLUCOSE 118 (H) 01/11/2018   CHOL 176 10/31/2016   TRIG 49 10/31/2016   HDL 59 10/31/2016   LDLCALC 107 (H) 10/31/2016   ALT 22 01/06/2018   AST 22 01/06/2018   NA 136 01/11/2018   K 3.9 01/11/2018   CL 105 01/11/2018   CREATININE 0.50 01/11/2018   BUN 9 01/11/2018   CO2 23 01/11/2018   TSH 0.66 01/29/2016   INR 1.48 01/10/2018   HGBA1C 5.6 01/09/2018      PT/INR:  Recent Labs    01/10/18 1344  LABPROT 17.8*  INR 1.48   Radiology: Dg Chest Port 1 View  Result Date: 01/10/2018 CLINICAL DATA:  Status post CABG EXAM: PORTABLE CHEST 1 VIEW COMPARISON:  01/10/2018, 01/09/2018, CT chest 12/14/2017 FINDINGS: Endotracheal tube tip is about 2.6 cm superior to the carina. Esophageal tube tip is below the diaphragm but non included. Right-sided Swan-Ganz catheter tip projects over the central pulmonary confluence. Post sternotomy changes. Central and left-sided chest drainage catheters. No definitive pneumothorax. Minimal atelectasis at the left base. Stable cardiomediastinal silhouette with minimal central vascular congestion. IMPRESSION: 1. Support lines and tubes as above. 2. Stable cardiomediastinal silhouette with minimal vascular congestion. Minimal atelectasis at the left base. Electronically Signed   By: Jasmine PangKim  Fujinaga M.D.   On: 01/10/2018 14:03   Dg Chest Portable 1 View  Result Date: 01/10/2018 CLINICAL DATA:  Hypoxia EXAM: PORTABLE CHEST 1 VIEW COMPARISON:  January 09, 2018 FINDINGS: No evident retained needle. Endotracheal tube tip is 3.6 cm above the carina. Central catheter tip is in the main pulmonary outflow tract. There is a left chest tube and a mediastinal drain. Temporary pacemaker wires are attached to the right heart. No pneumothorax. There is mild atelectatic change in each lung base. No edema or consolidation. Heart size and pulmonary vascularity are normal.  No adenopathy. Patient is status post coronary artery bypass grafting. No evident bone lesions. IMPRESSION: Tube and catheter positions as described without pneumothorax. No edema or consolidation. Mild bibasilar atelectasis. No evident retained needle. These results were called by telephone at the time of interpretation on 01/10/2018 at 1:25 pm to Alease FrameAlexis Alvarez, RN , who verbally acknowledged these results. Electronically Signed   By: Bretta BangWilliam  Woodruff III M.D.   On: 01/10/2018 13:26     Assessment/Plan: S/P Procedure(s) (LRB): CORONARY ARTERY BYPASS  GRAFTING (CABG) TIMES 4, USING LEFT INTERNAL MAMMARY ARTERY TO OM1  AND ENDOSCOPICALLY HARVESTED RIGHT SAPHENOUS VEIN TO DIAGONAL, AND SEQUENTIALLY TO OM2 AND DISTAL CIRCUMFLEX (N/A) TRANSESOPHAGEAL ECHOCARDIOGRAM (TEE) (N/A)  1. CV - ST this am. CO/CI 4.4/2.7. On Milrinone drip-will discuss with Dr. Tyrone Sage. Also, on Lopressor 12.5 mg bid. Will increase to 25 mg bid and start low dose Lisinopril. 2.  Pulmonary - On 2 liters of oxygen via [place] Chest tubes with 420 cc last 24 hours. Per Dr. Tyrone Sage will remove mediastinal tube only today. CXR this am appears to show no pneumothorax, stable cardiomegaly, left base atelectasis, some vascular congestion. Encourage incentive spirometer. 4.  Acute blood loss anemia - H and H stable at 10.4 and 32. 5. Fever to 100.9-first post op day. WBC slightly elevated at 10,900. Related to SIRS. 6. Supplement potassium 7. Thrombocytopenia-platelets 85,000 8. DM_CBGs 121/117/108. Pre op HGA1C 5.6. She  likely has pre diabetes. Will stop accu checks and SS on transfer 9. Please see progression orders   Donielle Margaretann Loveless PA-C 01/11/2018 8:35 AM

## 2018-01-11 NOTE — Progress Notes (Signed)
CTSurgery  OOB to chair Sinus rhythm Receiving reglan for nausea VS stable pm labs ok

## 2018-01-12 ENCOUNTER — Inpatient Hospital Stay (HOSPITAL_COMMUNITY): Payer: BLUE CROSS/BLUE SHIELD

## 2018-01-12 LAB — BASIC METABOLIC PANEL
Anion gap: 6 (ref 5–15)
BUN: 15 mg/dL (ref 6–20)
CALCIUM: 8.9 mg/dL (ref 8.9–10.3)
CHLORIDE: 102 mmol/L (ref 98–111)
CO2: 27 mmol/L (ref 22–32)
CREATININE: 0.82 mg/dL (ref 0.44–1.00)
GFR calc Af Amer: >60 mL/min (ref >60)
GFR calc non Af Amer: >60 mL/min (ref >60)
Glucose, Bld: 134 mg/dL — ABNORMAL HIGH (ref 70–99)
Potassium: 4.9 mmol/L (ref 3.5–5.1)
SODIUM: 135 mmol/L (ref 135–145)

## 2018-01-12 LAB — CBC
HEMATOCRIT: 34.2 % — AB (ref 36.0–46.0)
HEMOGLOBIN: 10.6 g/dL — AB (ref 12.0–15.0)
MCH: 26.6 pg (ref 26.0–34.0)
MCHC: 31 g/dL (ref 30.0–36.0)
MCV: 85.7 fL (ref 78.0–100.0)
Platelets: 102 10*3/uL — ABNORMAL LOW (ref 150–400)
RBC: 3.99 MIL/uL (ref 3.87–5.11)
RDW: 15.1 % (ref 11.5–15.5)
WBC: 19.8 10*3/uL — ABNORMAL HIGH (ref 4.0–10.5)

## 2018-01-12 LAB — GLUCOSE, CAPILLARY
GLUCOSE-CAPILLARY: 119 mg/dL — AB (ref 70–99)
GLUCOSE-CAPILLARY: 111 mg/dL — AB (ref 70–99)
GLUCOSE-CAPILLARY: 135 mg/dL — AB (ref 70–99)
Glucose-Capillary: 128 mg/dL — ABNORMAL HIGH (ref 70–99)
Glucose-Capillary: 104 mg/dL — ABNORMAL HIGH (ref 70–99)
Glucose-Capillary: 138 mg/dL — ABNORMAL HIGH (ref 70–99)

## 2018-01-12 IMAGING — DX DG CHEST 1V PORT
1 series · 1 of 1 positions shown · non-contrast
Comparison: [DATE]

CLINICAL DATA: Chest tube present.  Status post CABG.

EXAM:
PORTABLE CHEST 1 VIEW

[chest ap]
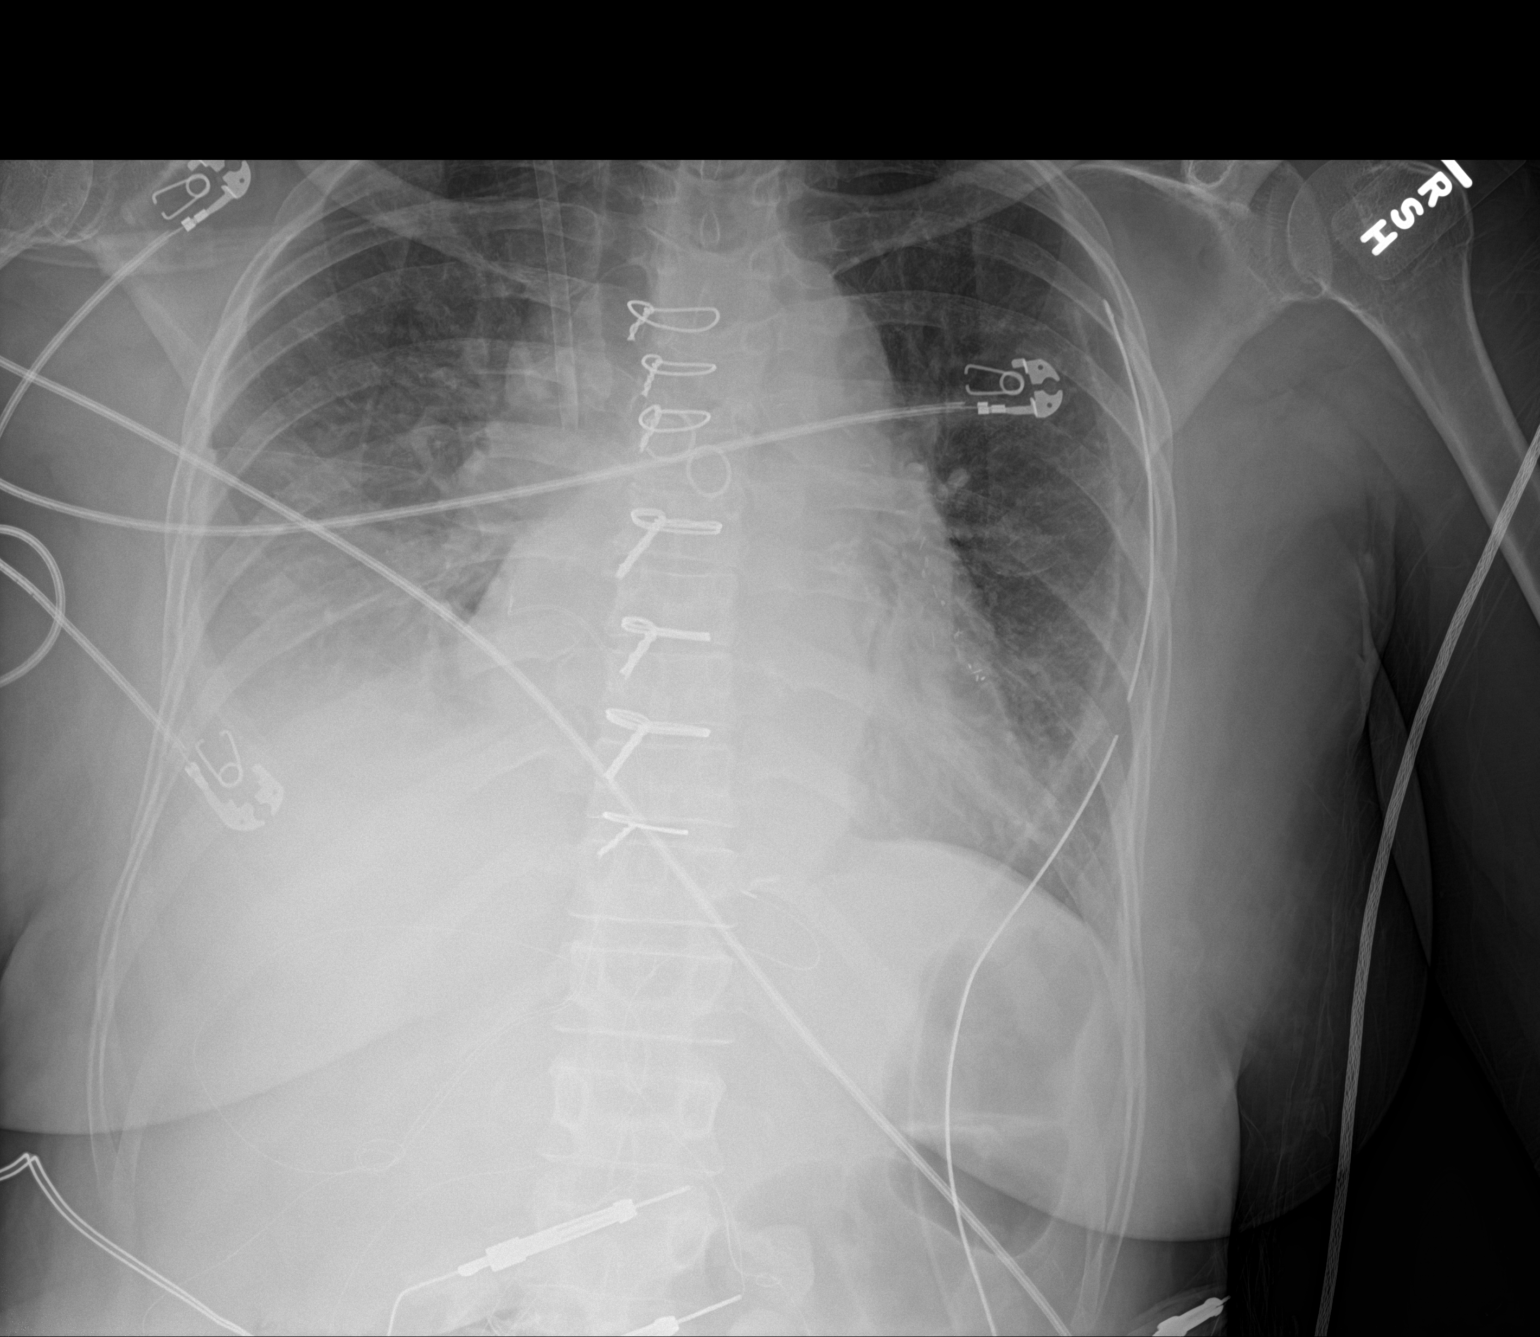

[1 of 1 positions shown; findings below may reference images not displayed]

FINDINGS: The left-sided chest tube is stable. A tiny left apical pneumothorax
is not excluded. The PA catheter is been removed. A right IJ sheath
remains in place. Stable cardiomegaly. The hila and mediastinum are
unchanged. Effusion and opacity in the right base is new in the
interval. Mild pulmonary venous congestion not excluded.
IMPRESSION: 1. Stable left chest tube. A tiny left apical pneumothorax is not
completely excluded.
2. The PA catheter is been removed. A right IJ sheath remains in
place.
3. New effusion and opacity in the right base.
4. Mild pulmonary venous congestion not excluded.

## 2018-01-12 MED FILL — Mannitol IV Soln 20%: INTRAVENOUS | Qty: 500 | Status: AC

## 2018-01-12 MED FILL — Albumin, Human Inj 5%: INTRAVENOUS | Qty: 250 | Status: AC

## 2018-01-12 MED FILL — Lidocaine HCl(Cardiac) IV PF Soln Pref Syr 100 MG/5ML (2%): INTRAVENOUS | Qty: 5 | Status: AC

## 2018-01-12 MED FILL — Sodium Chloride IV Soln 0.9%: INTRAVENOUS | Qty: 2000 | Status: AC

## 2018-01-12 MED FILL — Sodium Bicarbonate IV Soln 8.4%: INTRAVENOUS | Qty: 50 | Status: AC

## 2018-01-12 MED FILL — Heparin Sodium (Porcine) Inj 1000 Unit/ML: INTRAMUSCULAR | Qty: 10 | Status: AC

## 2018-01-12 MED FILL — Electrolyte-R (PH 7.4) Solution: INTRAVENOUS | Qty: 3000 | Status: AC

## 2018-01-12 NOTE — Progress Notes (Signed)
      [address]       [place] [id number]             [phone]      Up in chair  BP 106/64 (BP Location: Right Arm)   Pulse 93   Temp 98.1 F (36.7 C) (Axillary)   Resp (!) 27   Ht 5\' 3"  (1.6 m)   Wt 64.9 kg   SpO2 98%   BMI 25.35 kg/m    Intake/Output Summary (Last 24 hours) at 01/12/2018 1820 Last data filed at 01/12/2018 1200 Gross per 24 hour  Intake 360 ml  Output 940 ml  Net -580 ml   CBG well controlled, no PM labs  Anyely Cunning C. Dorris FetchHendrickson, MD [facility] [phone]

## 2018-01-12 NOTE — Care Management Note (Signed)
Case Management Note Diana PieriniKristi Maddix Kliewer RN,BSN Unit 2H 1-22 - RN Care Coordinator (Case Management) [phone]  Patient Details  Name: Diana [name] MRN: [id number] Date of Birth: 08/06/1955  Subjective/Objective:   Pt admitted with CAD s/p CABG on 8/13                 Action/Plan: PTA Pt lived at home, was independent with mobility and ADLs, anticipate return home. Donzetta StarchPCP-Eva Shaw, CM to follow for transition of care needs  Expected Discharge Date:                 Expected Discharge Plan:  Home/Self Care  In-House Referral:     Discharge planning Services  CM Consult  Post Acute Care Choice:    Choice offered to:     DME Arranged:    DME Agency:     HH Arranged:    HH Agency:     Status of Service:  In process, will continue to follow  If discussed at Long Length of Stay Meetings, dates discussed:    Discharge Disposition:   Additional Comments:  Darrold SpanWebster, [place] Hippert Hall, RN 01/12/2018, 11:42 AM

## 2018-01-12 NOTE — Progress Notes (Addendum)
TCTS DAILY ICU PROGRESS NOTE                   [address]            [place] [id number]          [phone]   2 Days Post-Op Procedure(s) (LRB): CORONARY ARTERY BYPASS GRAFTING (CABG) TIMES 4, USING LEFT INTERNAL MAMMARY ARTERY TO OM1  AND ENDOSCOPICALLY HARVESTED RIGHT SAPHENOUS VEIN TO DIAGONAL, AND SEQUENTIALLY TO OM2 AND DISTAL CIRCUMFLEX (N/A) TRANSESOPHAGEAL ECHOCARDIOGRAM (TEE) (N/A)  Total Length of Stay:  LOS: 7 days   Subjective: Patient had nausea yesterday. She feels better this am and is taking full liquids.  Objective: Vital signs in last 24 hours: Temp:  [97.6 F (36.4 C)-100.5 F (38.1 C)] 98 F (36.7 C) (08/15 0717) Pulse Rate:  [86-115] 99 (08/15 0700) Cardiac Rhythm: Sinus tachycardia;Normal sinus rhythm (08/15 0400) Resp:  [17-37] 17 (08/15 0700) BP: (85-139)/(64-90) 95/71 (08/15 0700) SpO2:  [92 %-100 %] 97 % (08/15 0700) Arterial Line BP: (136-141)/(53-55) 141/55 (08/14 0900) Weight:  [64.9 kg] 64.9 kg (08/15 0500)  Filed Weights   01/10/18 0517 01/11/18 0600 01/12/18 0500  Weight: 61.2 kg 65.6 kg 64.9 kg    Weight change: -0.7 kg   Hemodynamic parameters for last 24 hours: PAP: (15-26)/(7-19) 26/19  Intake/Output from previous day: 08/14 0701 - 08/15 0700 In: 227.9 [I.V.:77.9; IV Piggyback:150.1] Out: 1540 [Urine:1390; Chest Tube:50]  Intake/Output this shift: No intake/output data recorded.  Current Meds: Scheduled Meds: . acetaminophen  1,000 mg Oral Q6H   Or  . acetaminophen (TYLENOL) oral liquid 160 mg/5 mL  1,000 mg Per Tube Q6H  . aspirin EC  325 mg Oral Daily   Or  . aspirin  324 mg Per Tube Daily  . atorvastatin  80 mg Oral q1800  . bisacodyl  10 mg Oral Daily   Or  . bisacodyl  10 mg Rectal Daily  . Chlorhexidine Gluconate Cloth  6 each Topical Daily  . docusate sodium  200 mg Oral Daily  . insulin aspart  0-24 Units Subcutaneous Q4H  . lisinopril  2.5 mg Oral Daily  . mouth rinse  15 mL Mouth Rinse  BID  . metoCLOPramide (REGLAN) injection  10 mg Intravenous Q8H  . metoprolol tartrate  25 mg Oral BID   Or  . metoprolol tartrate  25 mg Per Tube BID  . pantoprazole  40 mg Oral Daily  . pregabalin  75 mg Oral BID  . sodium chloride flush  10-40 mL Intracatheter Q12H  . sodium chloride flush  3 mL Intravenous Q12H   Continuous Infusions: . sodium chloride Stopped (01/11/18 0934)  . sodium chloride    . sodium chloride 10 mL/hr at 01/10/18 1408  . lactated ringers 20 mL/hr at 01/10/18 1417  . lactated ringers 20 mL/hr at 01/10/18 1640  . milrinone Stopped (01/11/18 1402)  . nitroGLYCERIN Stopped (01/10/18 2112)  . phenylephrine (NEO-SYNEPHRINE) Adult infusion Stopped (01/11/18 0015)   PRN Meds:.sodium chloride, ALPRAZolam, hydrALAZINE, metoprolol tartrate, midazolam, morphine injection, ondansetron (ZOFRAN) IV, promethazine, sodium chloride flush, sodium chloride flush, traMADol  General appearance: alert, cooperative and no distress Neurologic: intact Heart: Slightly tachycardic Lungs: Slightly diminshed at bases Abdomen: Soft, non tender, sporadic bowel sounds present Extremities: Mild RLE edema;SCD LLE Wound: RLE wounds are clean and dry. Aquacel intact  Lab Results: CBC: Recent Labs    01/11/18 1612 01/11/18 1626 01/12/18 0355  WBC 13.1*  --  19.8*  HGB 11.6* 11.2* 10.6*  HCT 36.6 33.0* 34.2*  PLT 77*  --  102*   BMET:  Recent Labs    01/11/18 0354  01/11/18 1626 01/12/18 0355  NA 136  --  137 135  K 3.9  --  3.9 4.9  CL 105  --  99 102  CO2 23  --   --  27  GLUCOSE 118*  --  126* 134*  BUN 9  --  12 15  CREATININE 0.50   < > 0.40* 0.82  CALCIUM 8.6*  --   --  8.9   < > = values in this interval not displayed.    CMET: Lab Results  Component Value Date   WBC 19.8 (H) 01/12/2018   HGB 10.6 (L) 01/12/2018   HCT 34.2 (L) 01/12/2018   PLT 102 (L) 01/12/2018   GLUCOSE 134 (H) 01/12/2018   CHOL 176 10/31/2016   TRIG 49 10/31/2016   HDL 59 10/31/2016     LDLCALC 107 (H) 10/31/2016   ALT 22 01/06/2018   AST 22 01/06/2018   NA 135 01/12/2018   K 4.9 01/12/2018   CL 102 01/12/2018   CREATININE 0.82 01/12/2018   BUN 15 01/12/2018   CO2 27 01/12/2018   TSH 0.66 01/29/2016   INR 1.48 01/10/2018   HGBA1C 5.6 01/09/2018      PT/INR:  Recent Labs    01/10/18 1344  LABPROT 17.8*  INR 1.48   Radiology: No results found.   Assessment/Plan: S/P Procedure(s) (LRB): CORONARY ARTERY BYPASS GRAFTING (CABG) TIMES 4, USING LEFT INTERNAL MAMMARY ARTERY TO OM1  AND ENDOSCOPICALLY HARVESTED RIGHT SAPHENOUS VEIN TO DIAGONAL, AND SEQUENTIALLY TO OM2 AND DISTAL CIRCUMFLEX (N/A) TRANSESOPHAGEAL ECHOCARDIOGRAM (TEE) (N/A)  1. CV - SR.  Also, on Lopressor 25 mg bid and  low dose Lisinopril 2.5 mg daily. 2.  Pulmonary - On 2 liters of oxygen via [place] today. Chest tube with 50 cc of output last 24 hours. CXR this am appears stable. Will remove chest tube. Encourage incentive spirometer. 4.  Acute blood loss anemia - H and H stable at 10.6 and 34.2 5. Fever to 100.9-first post op day. WBC slightly elevated at 19,000. Related to SIRS. 6. Supplement potassium 7. Thrombocytopenia-platelets up to 102,000 8. DM-CBGs 140/128/104. Pre op HGA1C 5.6. She  likely has pre diabetes. Will stop accu checks and SS on transfer 9. GI-nausea yesterday and given Reglan    Donielle Margaretann LovelessM Zimmerman PA-C 01/12/2018 7:51 AM   Nausea improved from last night D/c foley and chest tube  I have seen and examined Diana MuscaPamela M Carias and agree with the above assessment  and plan.  Delight OvensEdward B Rita Vialpando MD [facility] [phone] Office [phone] 01/12/2018 8:12 AM

## 2018-01-12 NOTE — Anesthesia Postprocedure Evaluation (Signed)
Anesthesia Post Note  Patient: Diana [name]  Procedure(s) Performed: CORONARY ARTERY BYPASS GRAFTING (CABG) TIMES 4, USING LEFT INTERNAL MAMMARY ARTERY TO OM1  AND ENDOSCOPICALLY HARVESTED RIGHT SAPHENOUS VEIN TO DIAGONAL, AND SEQUENTIALLY TO OM2 AND DISTAL CIRCUMFLEX (N/A Chest) TRANSESOPHAGEAL ECHOCARDIOGRAM (TEE) (N/A )     Patient location during evaluation: SICU Anesthesia Type: General Level of consciousness: awake Pain management: pain level controlled Vital Signs Assessment: post-procedure vital signs reviewed and stable Respiratory status: spontaneous breathing Cardiovascular status: stable Postop Assessment: no apparent nausea or vomiting Anesthetic complications: no    Last Vitals:  Vitals:   01/12/18 0700 01/12/18 0717  BP: 95/71   Pulse: 99   Resp: 17   Temp:  36.7 C  SpO2: 97%     Last Pain:  Vitals:   01/12/18 0800  TempSrc:   PainSc: 2                  Shelton SilvasKevin D Hollis

## 2018-01-13 ENCOUNTER — Inpatient Hospital Stay (HOSPITAL_COMMUNITY): Payer: BLUE CROSS/BLUE SHIELD

## 2018-01-13 LAB — TYPE AND SCREEN
ABO/RH(D): O POS
Antibody Screen: NEGATIVE
Unit division: 0
Unit division: 0
Unit division: 0
Unit division: 0

## 2018-01-13 LAB — GLUCOSE, CAPILLARY
GLUCOSE-CAPILLARY: 113 mg/dL — AB (ref 70–99)
GLUCOSE-CAPILLARY: 112 mg/dL — AB (ref 70–99)
GLUCOSE-CAPILLARY: 109 mg/dL — AB (ref 70–99)
GLUCOSE-CAPILLARY: 94 mg/dL (ref 70–99)
Glucose-Capillary: 132 mg/dL — ABNORMAL HIGH (ref 70–99)
Glucose-Capillary: 215 mg/dL — ABNORMAL HIGH (ref 70–99)
Glucose-Capillary: 96 mg/dL (ref 70–99)

## 2018-01-13 LAB — CBC
HCT: 28.6 % — ABNORMAL LOW (ref 36.0–46.0)
HEMOGLOBIN: 9 g/dL — AB (ref 12.0–15.0)
MCH: 26.6 pg (ref 26.0–34.0)
MCHC: 31.5 g/dL (ref 30.0–36.0)
MCV: 84.6 fL (ref 78.0–100.0)
PLATELETS: 108 10*3/uL — AB (ref 150–400)
RBC: 3.38 MIL/uL — ABNORMAL LOW (ref 3.87–5.11)
RDW: 15.2 % (ref 11.5–15.5)
WBC: 16.1 10*3/uL — ABNORMAL HIGH (ref 4.0–10.5)

## 2018-01-13 LAB — BPAM RBC
Blood Product Expiration Date: 201909082359
Blood Product Expiration Date: 201909082359
Blood Product Expiration Date: 201909082359
Blood Product Expiration Date: 201909082359
ISSUE DATE / TIME: 201908130756
ISSUE DATE / TIME: 201908130756
ISSUE DATE / TIME: 201908131211
ISSUE DATE / TIME: 201908131211
Unit Type and Rh: 5100
Unit Type and Rh: 5100
Unit Type and Rh: 5100
Unit Type and Rh: 5100

## 2018-01-13 LAB — BASIC METABOLIC PANEL
Anion gap: 7 (ref 5–15)
BUN: 16 mg/dL (ref 6–20)
CO2: 28 mmol/L (ref 22–32)
CREATININE: 0.53 mg/dL (ref 0.44–1.00)
Calcium: 8.4 mg/dL — ABNORMAL LOW (ref 8.9–10.3)
Chloride: 99 mmol/L (ref 98–111)
GFR calc non Af Amer: >60 mL/min (ref >60)
Glucose, Bld: 121 mg/dL — ABNORMAL HIGH (ref 70–99)
Potassium: 4.3 mmol/L (ref 3.5–5.1)
SODIUM: 134 mmol/L — AB (ref 135–145)

## 2018-01-13 IMAGING — DX DG CHEST 1V PORT
1 series · 1 of 1 positions shown · non-contrast
Comparison: [DATE]

CLINICAL DATA: Pneumothorax, chest soreness.  Postop.

EXAM:
PORTABLE CHEST 1 VIEW

[chest ap]
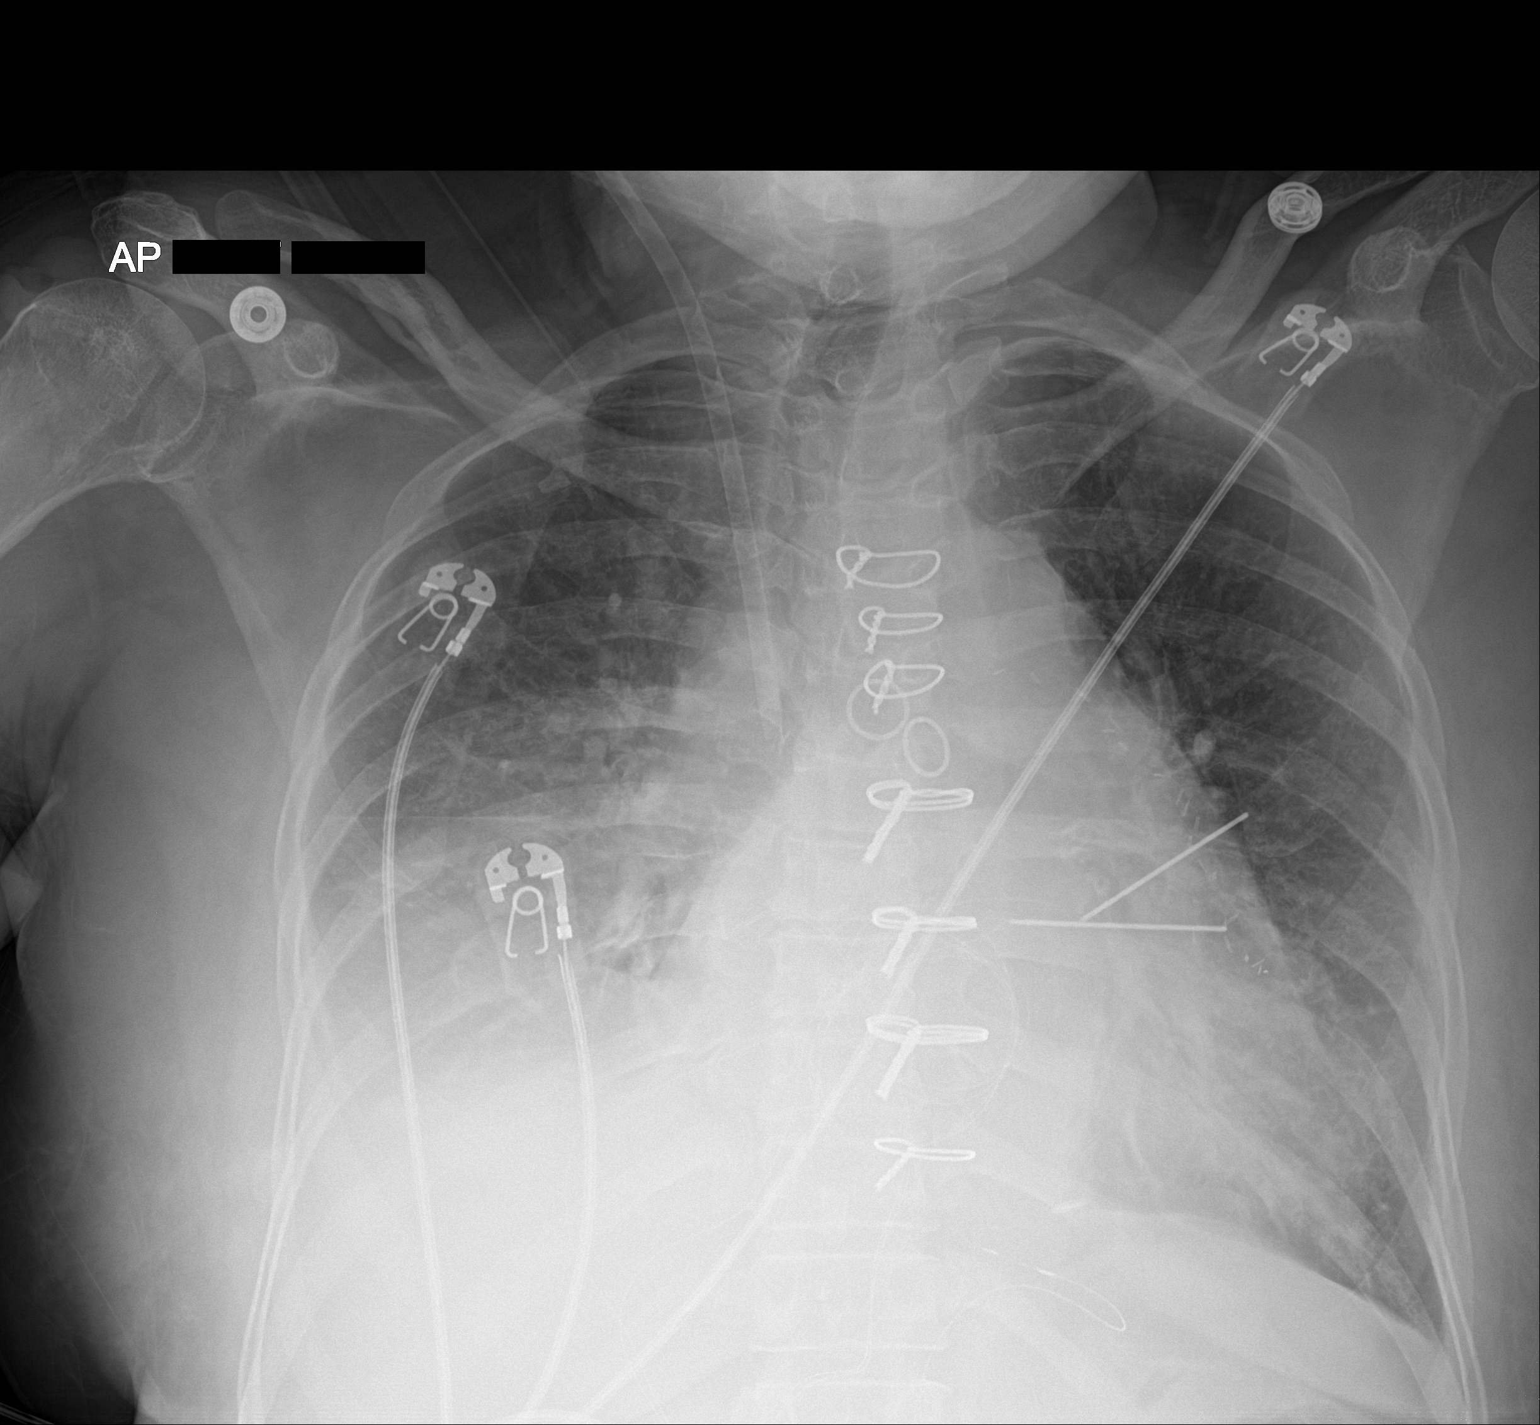

[1 of 1 positions shown; findings below may reference images not displayed]

FINDINGS: Interval removal of left chest tube. No visible pneumothorax.
Cardiomegaly with vascular congestion. Right lower lobe airspace
opacity with layering right effusion, similar to prior study. No
confluent opacity on the left.
IMPRESSION: Cardiomegaly with vascular congestion.

Right lower lobe atelectasis or infiltrate with layering right
effusion. No significant change.

## 2018-01-13 MED ORDER — SODIUM CHLORIDE 0.9 % IV SOLN: 250.0000 mL | INTRAVENOUS | Status: DC | PRN

## 2018-01-13 MED ORDER — SODIUM CHLORIDE 0.9% FLUSH: 3.0000 mL | INTRAVENOUS | Status: DC | PRN

## 2018-01-13 MED ORDER — ENOXAPARIN SODIUM 30 MG/0.3ML ~~LOC~~ SOLN
30.0000 mg | Freq: Every day | SUBCUTANEOUS | Status: DC
  Administered 2018-01-13 – 2018-01-16 (×4): 30 mg via SUBCUTANEOUS
  Filled 2018-01-13 (×4): qty 0.3

## 2018-01-13 MED ORDER — DOCUSATE SODIUM 100 MG PO CAPS
200.0000 mg | ORAL_CAPSULE | Freq: Every day | ORAL | Status: DC
  Administered 2018-01-13 – 2018-01-19 (×7): 200 mg via ORAL
  Filled 2018-01-13 (×7): qty 2

## 2018-01-13 MED ORDER — PANTOPRAZOLE SODIUM 40 MG PO TBEC
40.0000 mg | DELAYED_RELEASE_TABLET | Freq: Every day | ORAL | Status: DC
  Administered 2018-01-13 – 2018-01-19 (×7): 40 mg via ORAL
  Filled 2018-01-13 (×7): qty 1

## 2018-01-13 MED ORDER — TRAMADOL HCL 50 MG PO TABS
50.0000 mg | ORAL_TABLET | ORAL | Status: DC | PRN
Start: 2018-01-13 — End: 2018-01-19
  Administered 2018-01-14 – 2018-01-16 (×3): 100 mg via ORAL
  Administered 2018-01-16: 50 mg via ORAL
  Filled 2018-01-13: qty 2
  Filled 2018-01-13: qty 1
  Filled 2018-01-13 (×2): qty 2

## 2018-01-13 MED ORDER — FERROUS SULFATE 325 (65 FE) MG PO TABS
325.0000 mg | ORAL_TABLET | Freq: Two times a day (BID) | ORAL | Status: DC
  Administered 2018-01-13 – 2018-01-19 (×13): 325 mg via ORAL
  Filled 2018-01-13 (×13): qty 1

## 2018-01-13 MED ORDER — BISACODYL 5 MG PO TBEC: 10.0000 mg | DELAYED_RELEASE_TABLET | Freq: Every day | ORAL | Status: DC | PRN

## 2018-01-13 MED ORDER — FOLIC ACID 1 MG PO TABS
1.0000 mg | ORAL_TABLET | Freq: Every day | ORAL | Status: DC
  Administered 2018-01-13 – 2018-01-19 (×7): 1 mg via ORAL
  Filled 2018-01-13 (×7): qty 1

## 2018-01-13 MED ORDER — METOPROLOL TARTRATE 25 MG PO TABS
25.0000 mg | ORAL_TABLET | Freq: Two times a day (BID) | ORAL | Status: DC
  Administered 2018-01-13 – 2018-01-14 (×3): 25 mg via ORAL
  Filled 2018-01-13 (×3): qty 1

## 2018-01-13 MED ORDER — METOPROLOL TARTRATE 12.5 MG HALF TABLET
12.5000 mg | ORAL_TABLET | Freq: Two times a day (BID) | ORAL | Status: DC
  Administered 2018-01-13: 12.5 mg via ORAL
  Filled 2018-01-13: qty 1

## 2018-01-13 MED ORDER — ONDANSETRON HCL 4 MG/2ML IJ SOLN
4.0000 mg | Freq: Four times a day (QID) | INTRAMUSCULAR | Status: DC | PRN
  Administered 2018-01-16: 4 mg via INTRAVENOUS
  Filled 2018-01-13: qty 2

## 2018-01-13 MED ORDER — SODIUM CHLORIDE 0.9% FLUSH
3.0000 mL | Freq: Two times a day (BID) | INTRAVENOUS | Status: DC
  Administered 2018-01-13 – 2018-01-19 (×12): 3 mL via INTRAVENOUS

## 2018-01-13 MED ORDER — GUAIFENESIN ER 600 MG PO TB12
600.0000 mg | ORAL_TABLET | Freq: Two times a day (BID) | ORAL | Status: DC | PRN
  Administered 2018-01-14 – 2018-01-15 (×2): 600 mg via ORAL
  Filled 2018-01-13 (×2): qty 1

## 2018-01-13 MED ORDER — METOPROLOL TARTRATE 5 MG/5ML IV SOLN: 5.0000 mg | Freq: Once | INTRAVENOUS | Status: AC

## 2018-01-13 MED ORDER — ASPIRIN EC 325 MG PO TBEC
325.0000 mg | DELAYED_RELEASE_TABLET | Freq: Every day | ORAL | Status: DC
  Administered 2018-01-13 – 2018-01-19 (×7): 325 mg via ORAL
  Filled 2018-01-13 (×7): qty 1

## 2018-01-13 MED ORDER — FUROSEMIDE 20 MG PO TABS
20.0000 mg | ORAL_TABLET | Freq: Every day | ORAL | Status: AC
  Administered 2018-01-13 – 2018-01-15 (×3): 20 mg via ORAL
  Filled 2018-01-13 (×3): qty 1

## 2018-01-13 MED ORDER — BISACODYL 10 MG RE SUPP
10.0000 mg | Freq: Every day | RECTAL | Status: DC | PRN
  Administered 2018-01-15 – 2018-01-17 (×2): 10 mg via RECTAL
  Filled 2018-01-13 (×3): qty 1

## 2018-01-13 MED ORDER — MOVING RIGHT ALONG BOOK
Freq: Once | Status: AC
  Administered 2018-01-13: 12:00:00
  Filled 2018-01-13: qty 1

## 2018-01-13 MED ORDER — METOPROLOL TARTRATE 5 MG/5ML IV SOLN
INTRAVENOUS | Status: AC
  Administered 2018-01-13: 5 mg
  Filled 2018-01-13: qty 5

## 2018-01-13 MED ORDER — ONDANSETRON HCL 4 MG PO TABS
4.0000 mg | ORAL_TABLET | Freq: Four times a day (QID) | ORAL | Status: DC | PRN
  Filled 2018-01-13: qty 1

## 2018-01-13 MED ORDER — INSULIN ASPART 100 UNIT/ML ~~LOC~~ SOLN
0.0000 [IU] | Freq: Three times a day (TID) | SUBCUTANEOUS | Status: DC
  Administered 2018-01-13: 8 [IU] via SUBCUTANEOUS

## 2018-01-13 MED ORDER — OXYCODONE HCL 5 MG PO TABS
5.0000 mg | ORAL_TABLET | ORAL | Status: DC | PRN
Start: 2018-01-13 — End: 2018-01-19
  Administered 2018-01-13 (×2): 5 mg via ORAL
  Administered 2018-01-14 – 2018-01-15 (×4): 10 mg via ORAL
  Administered 2018-01-15: 5 mg via ORAL
  Administered 2018-01-16 – 2018-01-18 (×5): 10 mg via ORAL
  Filled 2018-01-13: qty 1
  Filled 2018-01-13 (×2): qty 2
  Filled 2018-01-13 (×2): qty 1
  Filled 2018-01-13 (×7): qty 2

## 2018-01-13 MED ORDER — POTASSIUM CHLORIDE CRYS ER 10 MEQ PO TBCR
10.0000 meq | EXTENDED_RELEASE_TABLET | Freq: Every day | ORAL | Status: AC
  Administered 2018-01-13 – 2018-01-15 (×3): 10 meq via ORAL
  Filled 2018-01-13 (×2): qty 1

## 2018-01-13 NOTE — Discharge Instructions (Signed)
Coronary Artery Bypass Grafting, Care After °These instructions give you information on caring for yourself after your procedure. Your doctor may also give you more specific instructions. Call your doctor if you have any problems or questions after your procedure. °Follow these instructions at home: °· Only take medicine as told by your doctor. Take medicines exactly as told. Do not stop taking medicines or start any new medicines without talking to your doctor first. °· Take your pulse as told by your doctor. °· Do deep breathing as told by your doctor. Use your breathing device (incentive spirometer), if given, to practice deep breathing several times a day. Support your chest with a pillow or your arms when you take deep breaths or cough. °· Keep the area clean, dry, and protected where the surgery cuts (incisions) were made. Remove bandages (dressings) only as told by your doctor. If strips were applied to surgical area, do not take them off. They fall off on their own. °· Check the surgery area daily for puffiness (swelling), redness, or leaking fluid. °· If surgery cuts were made in your legs: °? Avoid crossing your legs. °? Avoid sitting for long periods of time. Change positions every 30 minutes. °? Raise your legs when you are sitting. Place them on pillows. °· Wear stockings that help keep blood clots from forming in your legs (compression stockings). °· Only take sponge baths until your doctor says it is okay to take showers. Pat the surgery area dry. Do not rub the surgery area with a washcloth or towel. Do not bathe, swim, or use a hot tub until your doctor says it is okay. °· Eat foods that are high in fiber. These include raw fruits and vegetables, whole grains, beans, and nuts. Choose lean meats. Avoid canned, processed, and fried foods. °· Drink enough fluids to keep your pee (urine) clear or pale yellow. °· Weigh yourself every day. °· Rest and limit activity as told by your doctor. You may be told  to: °? Stop any activity if you have chest pain, shortness of breath, changes in heartbeat, or dizziness. Get help right away if this happens. °? Move around often for short amounts of time or take short walks as told by your doctor. Gradually become more active. You may need help to strengthen your muscles and build endurance. °? Avoid lifting, pushing, or pulling anything heavier than 10 pounds (4.5 kg) for at least 6 weeks after surgery. °· Do not drive until your doctor says it is okay. °· Ask your doctor when you can go back to work. °· Ask your doctor when you can begin sexual activity again. °· Follow up with your doctor as told. °Contact a doctor if: °· You have puffiness, redness, more pain, or fluid draining from the incision site. °· You have a fever. °· You have puffiness in your ankles or legs. °· You have pain in your legs. °· You gain 2 or more pounds (0.9 kg) a day. °· You feel sick to your stomach (nauseous) or throw up (vomit). °· You have watery poop (diarrhea). °Get help right away if: °· You have chest pain that goes to your jaw or arms. °· You have shortness of breath. °· You have a fast or irregular heartbeat. °· You notice a "clicking" in your breastbone when you move. °· You have numbness or weakness in your arms or legs. °· You feel dizzy or light-headed. °This information is not intended to replace advice given to you by   your health care provider. Make sure you discuss any questions you have with your health care provider. °Document Released: 05/22/2013 Document Revised: 10/23/2015 Document Reviewed: 10/24/2012 °Elsevier Interactive Patient Education © 2017 [employer] ° °

## 2018-01-13 NOTE — Progress Notes (Signed)
Patient ID: Diana [name], female   DOB: 10/07/1955, 62 y.o.   MRN: [id number] [facility] DAILY ICU PROGRESS NOTE                   [address]            [place] [id number]          [phone]   3 Days Post-Op Procedure(s) (LRB): CORONARY ARTERY BYPASS GRAFTING (CABG) TIMES 4, USING LEFT INTERNAL MAMMARY ARTERY TO OM1  AND ENDOSCOPICALLY HARVESTED RIGHT SAPHENOUS VEIN TO DIAGONAL, AND SEQUENTIALLY TO OM2 AND DISTAL CIRCUMFLEX (N/A) TRANSESOPHAGEAL ECHOCARDIOGRAM (TEE) (N/A)  Total Length of Stay:  LOS: 8 days   Subjective: Patient awake alert neurologically intact, nausea has improved, she is walked in the and taking a p.o. diet without nausea  Objective: Vital signs in last 24 hours: Temp:  [97.6 F (36.4 C)-98.6 F (37 C)] 98.6 F (37 C) (08/16 0400) Pulse Rate:  [93-128] 107 (08/16 0600) Cardiac Rhythm: Sinus tachycardia (08/16 0000) Resp:  [19-40] 23 (08/16 0600) BP: (86-128)/(64-81) 113/81 (08/16 0600) SpO2:  [86 %-100 %] 98 % (08/16 0600) Weight:  [63.5 kg] 63.5 kg (08/16 0630)  Filed Weights   01/11/18 0600 01/12/18 0500 01/13/18 0630  Weight: 65.6 kg 64.9 kg 63.5 kg    Weight change: -1.4 kg   Hemodynamic parameters for last 24 hours:    Intake/Output from previous day: 08/15 0701 - 08/16 0700 In: 600 [P.O.:600] Out: 220 [Urine:220]  Intake/Output this shift: No intake/output data recorded.  Current Meds: Scheduled Meds: . acetaminophen  1,000 mg Oral Q6H   Or  . acetaminophen (TYLENOL) oral liquid 160 mg/5 mL  1,000 mg Per Tube Q6H  . aspirin EC  325 mg Oral Daily   Or  . aspirin  324 mg Per Tube Daily  . atorvastatin  80 mg Oral q1800  . bisacodyl  10 mg Oral Daily   Or  . bisacodyl  10 mg Rectal Daily  . Chlorhexidine Gluconate Cloth  6 each Topical Daily  . docusate sodium  200 mg Oral Daily  . insulin aspart  0-24 Units Subcutaneous Q4H  . lisinopril  2.5 mg Oral Daily  . mouth rinse  15 mL Mouth Rinse BID  . metoprolol  tartrate  25 mg Oral BID   Or  . metoprolol tartrate  25 mg Per Tube BID  . pantoprazole  40 mg Oral Daily  . pregabalin  75 mg Oral BID  . sodium chloride flush  10-40 mL Intracatheter Q12H  . sodium chloride flush  3 mL Intravenous Q12H   Continuous Infusions: . sodium chloride Stopped (01/11/18 0934)  . sodium chloride    . sodium chloride 10 mL/hr at 01/10/18 1408  . lactated ringers 20 mL/hr at 01/10/18 1417  . lactated ringers 20 mL/hr at 01/10/18 1640  . milrinone Stopped (01/11/18 1402)  . nitroGLYCERIN Stopped (01/10/18 2112)  . phenylephrine (NEO-SYNEPHRINE) Adult infusion Stopped (01/11/18 0015)   PRN Meds:.sodium chloride, ALPRAZolam, hydrALAZINE, metoprolol tartrate, midazolam, morphine injection, ondansetron (ZOFRAN) IV, promethazine, sodium chloride flush, sodium chloride flush, traMADol  General appearance: alert and cooperative Neurologic: intact Heart: regular rate and rhythm, S1, S2 normal, no murmur, click, rub or gallop Lungs: diminished breath sounds bibasilar Abdomen: soft, non-tender; bowel sounds normal; no masses,  no organomegaly Extremities: extremities normal, atraumatic, no cyanosis or edema and Homans sign is negative, no sign of DVT Wound: Dressing intact sternum stable  Lab Results: CBC: Recent  Labs    01/12/18 0355 01/13/18 0426  WBC 19.8* 16.1*  HGB 10.6* 9.0*  HCT 34.2* 28.6*  PLT 102* 108*   BMET:  Recent Labs    01/12/18 0355 01/13/18 0426  NA 135 134*  K 4.9 4.3  CL 102 99  CO2 27 28  GLUCOSE 134* 121*  BUN 15 16  CREATININE 0.82 0.53  CALCIUM 8.9 8.4*    CMET: Lab Results  Component Value Date   WBC 16.1 (H) 01/13/2018   HGB 9.0 (L) 01/13/2018   HCT 28.6 (L) 01/13/2018   PLT 108 (L) 01/13/2018   GLUCOSE 121 (H) 01/13/2018   CHOL 176 10/31/2016   TRIG 49 10/31/2016   HDL 59 10/31/2016   LDLCALC 107 (H) 10/31/2016   ALT 22 01/06/2018   AST 22 01/06/2018   NA 134 (L) 01/13/2018   K 4.3 01/13/2018   CL 99  01/13/2018   CREATININE 0.53 01/13/2018   BUN 16 01/13/2018   CO2 28 01/13/2018   TSH 0.66 01/29/2016   INR 1.48 01/10/2018   HGBA1C 5.6 01/09/2018      PT/INR:  Recent Labs    01/10/18 1344  LABPROT 17.8*  INR 1.48   Radiology: No results found.   Assessment/Plan: S/P Procedure(s) (LRB): CORONARY ARTERY BYPASS GRAFTING (CABG) TIMES 4, USING LEFT INTERNAL MAMMARY ARTERY TO OM1  AND ENDOSCOPICALLY HARVESTED RIGHT SAPHENOUS VEIN TO DIAGONAL, AND SEQUENTIALLY TO OM2 AND DISTAL CIRCUMFLEX (N/A) TRANSESOPHAGEAL ECHOCARDIOGRAM (TEE) (N/A) Mobilize Diuresis Plan for transfer to step-down: see transfer orders Expected Acute  Blood - loss Anemia- continue to monitor      [name] B [name] 01/13/2018 8:13 AM

## 2018-01-13 NOTE — Discharge Summary (Signed)
Physician Discharge Summary       [address]       [place] [address]             [phone]    Patient ID: Diana [name] MRN: [id number] DOB/AGE: 1956-05-03 62 y.o.  Admit date: 01/05/2018 Discharge date: 01/19/2018  Admission Diagnoses: 1. Unstable angina ([facility]) 2. CAD (coronary artery disease)  Discharge Diagnoses:  1. S/P CABG x 4 2. Thrombocytopenia-resolved prior to discharge 3. Bronchitis vs early pneumonia 4. History of Non-ST elevation (NSTEMI) myocardial infarction (HCC)-10/2016 5. History of hyperlipidemia 6. History of benign essential hypertension 7. History of Arthritis-feet 8. History of Carpal tunnel syndrome 9. History of GERD 10. History of anemia 11. History of chronic thoracic spine pain    Procedure (s):  Troy Sine, MD (Primary)    Procedures   LEFT HEART CATH AND CORONARY ANGIOGRAPHY on 01/05/2018:  Conclusion     Prox RCA to Mid RCA lesion is 25% stenosed.  Ost 1st Mrg lesion is 80% stenosed.  Prox Cx to Mid Cx lesion is 95% stenosed.  Ost LAD to Prox LAD lesion is 80% stenosed.  Ost 1st Diag to 1st Diag lesion is 90% stenosed.  Prox LAD to Mid LAD lesion is 90% stenosed.  Ost 2nd Diag to 2nd Diag lesion is 99% stenosed.  Mid LAD-1 lesion is 99% stenosed with 99% stenosed side branch in Ost 2nd Diag.  Mid LAD-2 lesion is 99% stenosed.  The left ventricular ejection fraction is 50-55% by visual estimate.  The left ventricular systolic function is normal.   Significant multivessel CAD with evidence for coronary calcification.    TRANSESOPHAGEAL ECHOCARDIOGRAM (TEE),  MEDIAN STERNOTOMY for CORONARY ARTERY BYPASS GRAFTING (CABG) TIMES 4  (LEFT INTERNAL MAMMARY ARTERY TO OM1,  RIGHT GREATER SAPHENOUS VEIN TO DIAGONAL, AND SEQUENTIALLY TO OM2 AND DISTAL CIRC) using RIGHT GREATER SAPHENOUS VEIN  ENDOSCOPICALLY HARVESTED by Dr. Servando Snare on 01/10/2018.  History of Presenting Illness:   Diana [name] is a 62 year old  patient with a past medical history significant for benign essential hypertension, hyperlipidemia, GERD, iron deficiency anemia, and chronic thoracic spine pain who about a year ago had exertional diaphoresis, some coughing with upper respiratory congestion, and some tightness in her chest.  A troponin was drawn and it was abnormal and the patient was to report to the hospital.  Her troponin then dropped and a myocardial perfusion scan showed a fixed apical defect but no ischemia.  Estimated left ejection fraction was read as 44%.  She underwent echocardiogram which showed an inferior wall motion abnormality with an EF of 55%.  She was seen by [facility] heart care at the time and was supposed to have followed up in 2 to 3 weeks and was discharged on nitroglycerin but no other medications.    Recently, she was having some significant neck and shoulder pain that was suggestive of a cervical disc disease.  She also had chest pain, diaphoresis, shortness of breath, nausea, and vomiting as associated symptoms.  Due to her symptoms and previous abnormal troponin a coronary CTA was performed on July 17 which showed severe stenosis or total occlusion of the LAD and circumflex with abnormal FFR and a moderate stenosis involving the ramus.  RCA evidently was nonischemic and had mild disease.  She continued to require intermittent nitroglycerin to relieve her substernal chest pain.  She underwent cardiac catheterization on 01/05/2018 which revealed significant multivessel coronary artery.  She had a estimated left ventricular ejection  fraction of 50 to 55%.  We are being consulted for possible surgical revascularization.  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   Of note the patient has history of anemia, iron deficiency and low white cell count.  She has been evaluated by the oncologist, given IV iron on several occasions, bone marrow biopsy had been recommended but not  done. The risks and options of treatment were discussed with her and Dr. Servando Snare agreed with Dr. Claiborne Billings proceeding with coronary artery bypass grafting offers the best option for relief of symptoms and preservation of myocardial function. Pre operative carotid duplex US showed no significant internal carotid artery stenosis bilaterally. She underwent a CABG x 4 on 01/10/2018.   Brief Hospital Course:  The patient was extubated the evening of surgery without difficulty. She remained afebrile and hemodynamically stable. She was weaned off of Milrinone drip. wan Ganz, a line, chest tubes, and foley were removed early in the post operative course. Lopressor was started and titrated accordingly. Lisinopril was also started for better control of her blood pressure. She was volume over loaded and diuresed. She had nausea post op and was given Reglan. She has a history of anemia. She did not require a post op transfusion. Last H and H was 9.2 and 28.7. She had thrombocytopenia. This did resolve and her last platelet count was up to 241,000. She was weaned off the insulin drip.  The patient's glucose remained well controlled. The patient's HGA1C pre op was  5.6. She developed a fever and leukocytosis. She was put on Levaquin and Vancomycin.  There were no signs of wound infection. Etiology was likely bronchitis vs early pneumonia. Fever resolved, and last ABC was down to 9,900. She was transitioned to oral Levaquin. She had persistent tachycardia post op. Lopressor was titrated accordingly. Dr. Wynonia Lawman saw the patient and an echo was done on 08/20 to rule out pericardial effusion. LVEF was 50-55%, mild MR, PR, and TR, and no significant pericardial effusion. Lopressor was increased to 50 mg tid and because her BP was labile low dose Lisinopril was stopped. Per Dr. Wynonia Lawman, she will be discharged on Lopressor 75 mg bid. The patient was felt surgically stable for transfer from the ICU to [facility] for further convalescence on  01/13/2018. She continues to progress with cardiac rehab. She was ambulating on room air. She has been tolerating a diet and has had a bowel movement. Epicardial pacing wires were removed on 01/19/2018. Chest tube sutures will be removed the day of discharge. The patient is felt surgically stable for discharge today.   Latest Vital Signs: Blood pressure 110/77, pulse 85, temperature 98.4 F (36.9 C), temperature source Oral, resp. rate (!) 28, height '5\' 3"'  (1.6 m), weight 59.3 kg, SpO2 96 %.  Physical Exam: Cardiovascular: Tachycardic Pulmonary: Mostly clear Abdomen: Soft, non tender, bowel sounds present. Extremities: No lower extremity edema. Wounds: Clean and dry.  No erythema or signs of infection.  Discharge Condition:Stable and discharged to home.  Recent laboratory studies:  Lab Results  Component Value Date   WBC 9.9 01/16/2018   HGB 9.2 (L) 01/16/2018   HCT 28.7 (L) 01/16/2018   MCV 82.5 01/16/2018   PLT 241 01/16/2018   Lab Results  Component Value Date   NA 135 01/16/2018   K 3.9 01/16/2018   CL 97 (L) 01/16/2018   CO2 27 01/16/2018   CREATININE 0.67 01/16/2018   GLUCOSE 110 (H) 01/16/2018  Diagnostic Studies: Dg Chest 2 View  Result Date: 01/09/2018 CLINICAL DATA:  Preop for CABG tomorrow EXAM: CHEST - 2 VIEW COMPARISON:  Chest CT 12/14/2017 FINDINGS: Normal heart size and mediastinal contours. Artifact from EKG leads. Otherwise the lungs are clear. Right carotid calcification. IMPRESSION: No evidence of active disease. Electronically Signed   By: Monte Fantasia M.D.   On: 01/09/2018 16:17   Dg Chest Port 1 View  Result Date: 01/14/2018 CLINICAL DATA:  Fever. Coronary bypass surgery on 01/10/2018. EXAM: PORTABLE CHEST 1 VIEW COMPARISON:  01/13/2018 FINDINGS: Postoperative changes in the mediastinum. Interval removal of right central venous catheter. Cardiac enlargement with mild vascular congestion. Persistent infiltration or atelectasis in the right lung  base with small right pleural effusion. No pneumothorax. Mediastinal contours appear intact. IMPRESSION: Cardiac enlargement with mild vascular congestion. Persistent infiltration or atelectasis in the right lung base with small right pleural effusion. Electronically Signed   By: Lucienne Capers M.D.   On: 01/14/2018 05:35   Dg Chest Port 1 View  Result Date: 01/13/2018 CLINICAL DATA:  Pneumothorax, chest soreness.  Postop. EXAM: PORTABLE CHEST 1 VIEW COMPARISON:  01/12/2018 FINDINGS: Interval removal of left chest tube. No visible pneumothorax. Cardiomegaly with vascular congestion. Right lower lobe airspace opacity with layering right effusion, similar to prior study. No confluent opacity on the left. IMPRESSION: Cardiomegaly with vascular congestion. Right lower lobe atelectasis or infiltrate with layering right effusion. No significant change. Electronically Signed   By: Rolm Baptise M.D.   On: 01/13/2018 09:16   Dg Chest Port 1 View  Result Date: 01/12/2018 CLINICAL DATA:  Chest tube present.  Status post CABG. EXAM: PORTABLE CHEST 1 VIEW COMPARISON:  January 11, 2018 FINDINGS: The left-sided chest tube is stable. A tiny left apical pneumothorax is not excluded. The PA catheter is been removed. A right IJ sheath remains in place. Stable cardiomegaly. The hila and mediastinum are unchanged. Effusion and opacity in the right base is new in the interval. Mild pulmonary venous congestion not excluded. IMPRESSION: 1. Stable left chest tube. A tiny left apical pneumothorax is not completely excluded. 2. The PA catheter is been removed. A right IJ sheath remains in place. 3. New effusion and opacity in the right base. 4. Mild pulmonary venous congestion not excluded. Electronically Signed   By: Dorise Bullion III M.D   On: 01/12/2018 07:58   Dg Chest Port 1 View  Result Date: 01/11/2018 CLINICAL DATA:  Status post coronary bypass graft. EXAM: PORTABLE CHEST 1 VIEW COMPARISON:  Radiograph of January 10, 2018. FINDINGS: Stable cardiomediastinal silhouette. Endotracheal and nasogastric tubes have been removed. Left-sided chest tube is noted without pneumothorax. Right internal jugular Swan-Ganz catheter is noted with tip directed into right pulmonary artery. No pneumothorax or pleural effusion is noted. Stable mild bibasilar subsegmental atelectasis. Bony thorax is unremarkable. IMPRESSION: Endotracheal and nasogastric tubes have been removed. Left-sided chest tube is unchanged without pneumothorax. Mild bibasilar subsegmental atelectasis is noted. Electronically Signed   By: Marijo Conception, M.D.   On: 01/11/2018 09:47   Dg Chest Port 1 View  Result Date: 01/10/2018 CLINICAL DATA:  Status post CABG EXAM: PORTABLE CHEST 1 VIEW COMPARISON:  01/10/2018, 01/09/2018, CT chest 12/14/2017 FINDINGS: Endotracheal tube tip is about 2.6 cm superior to the carina. Esophageal tube tip is below the diaphragm but non included. Right-sided Swan-Ganz catheter tip projects over the central pulmonary confluence. Post sternotomy changes. Central and left-sided chest drainage catheters. No definitive pneumothorax. Minimal atelectasis at the left  base. Stable cardiomediastinal silhouette with minimal central vascular congestion. IMPRESSION: 1. Support lines and tubes as above. 2. Stable cardiomediastinal silhouette with minimal vascular congestion. Minimal atelectasis at the left base. Electronically Signed   By: Donavan Foil M.D.   On: 01/10/2018 14:03   Dg Chest Portable 1 View  Result Date: 01/10/2018 CLINICAL DATA:  Hypoxia EXAM: PORTABLE CHEST 1 VIEW COMPARISON:  January 09, 2018 FINDINGS: No evident retained needle. Endotracheal tube tip is 3.6 cm above the carina. Central catheter tip is in the main pulmonary outflow tract. There is a left chest tube and a mediastinal drain. Temporary pacemaker wires are attached to the right heart. No pneumothorax. There is mild atelectatic change in each lung base. No edema or  consolidation. Heart size and pulmonary vascularity are normal. No adenopathy. Patient is status post coronary artery bypass grafting. No evident bone lesions. IMPRESSION: Tube and catheter positions as described without pneumothorax. No edema or consolidation. Mild bibasilar atelectasis. No evident retained needle. These results were called by telephone at the time of interpretation on 01/10/2018 at 1:25 pm to Melba Coon, RN , who verbally acknowledged these results. Electronically Signed   By: Lowella Grip III M.D.   On: 01/10/2018 13:26      Discharge Medications: Allergies as of 01/19/2018      Reactions   Contrast Media [iodinated Diagnostic Agents] Anaphylaxis   Had CT scan 12/14/17, had trouble breathing, throat swelled   Penicillins Rash, Other (See Comments)   PATIENT HAS HAD A PCN REACTION WITH IMMEDIATE RASH, FACIAL/TONGUE/THROAT SWELLING, SOB, OR LIGHTHEADEDNESS WITH HYPOTENSION:  #  #  YES  #  #  Has patient had a PCN reaction causing severe rash involving mucus membranes or skin necrosis:  no Has patient had a PCN reaction that required hospitalization:  no Has patient had a PCN reaction occurring within the last 10 years: no   Codeine Anxiety   Dizziness and headache      Medication List    STOP taking these medications   nitroGLYCERIN 0.4 MG SL tablet Commonly known as:  NITROSTAT     TAKE these medications   ALPRAZolam 0.5 MG tablet Commonly known as:  XANAX Take 1/2-1 tab po every 4 hours as needed for anxiety and take 2-4 tabs po qhs prn sleep   aspirin 325 MG EC tablet Take 1 tablet (325 mg total) by mouth daily. What changed:    medication strength  how much to take   atorvastatin 80 MG tablet Commonly known as:  LIPITOR Take 1 tablet (80 mg total) by mouth daily at 6 PM. What changed:    medication strength  how much to take  when to take this   butalbital-acetaminophen-caffeine 50-325-40 MG tablet Commonly known as:  FIORICET,  ESGIC Take 1-2 tablets by mouth every 6 (six) hours as needed for headache.   cyclobenzaprine 10 MG tablet Commonly known as:  FLEXERIL Take 0.5-1 tablets (5-10 mg total) by mouth 3 (three) times daily as needed for muscle spasms.   diclofenac 75 MG EC tablet Commonly known as:  VOLTAREN Take 1 tablet (75 mg total) by mouth 2 (two) times daily. Start taking on:  02/27/2018 What changed:  These instructions start on 02/27/2018. If you are unsure what to do until then, ask your doctor or other care provider.   ferrous sulfate 325 (65 FE) MG tablet Take 1 tablet (325 mg total) by mouth daily with breakfast. For one month then stop.   fluticasone 50  MCG/ACT nasal spray Commonly known as:  FLONASE Place 2 sprays into both nostrils daily.   levofloxacin 500 MG tablet Commonly known as:  LEVAQUIN Take 1 tablet (500 mg total) by mouth daily.   Metoprolol Tartrate 75 MG Tabs Take 75 mg by mouth every 12 (twelve) hours.   omeprazole 20 MG capsule Commonly known as:  PRILOSEC Take 1 capsule (20 mg total) by mouth daily.   ondansetron 8 MG disintegrating tablet Commonly known as:  ZOFRAN-ODT Take 0.5-1 tablets (4-8 mg total) by mouth every 8 (eight) hours as needed for nausea.   oxyCODONE 5 MG immediate release tablet Commonly known as:  Oxy IR/ROXICODONE Take 5 mg by mouth every 4-6 hours PRN severe pain.   pregabalin 75 MG capsule Commonly known as:  LYRICA Take 1 capsule (75 mg total) by mouth 2 (two) times daily.   Vitamin D (Ergocalciferol) 50000 units Caps capsule Commonly known as:  DRISDOL Take 1 capsule (50,000 Units total) every 7 (seven) days by mouth.            Durable Medical Equipment  (From admission, onward)         Start     Ordered   01/16/18 1517  For home use only DME Walker  Once    Question:  Patient needs a walker to treat with the following condition  Answer:  Dyspnea   01/16/18 1516         The patient has been discharged on:   1.Beta  Blocker:  Yes [  x ]                              No   [   ]                              If No, reason:  2.Ace Inhibitor/ARB: Yes [   ]                                     No  [  x  ]                                     If No, reason: Labile BP;hope to start as an outpatient after discharge  3.Statin:   Yes [  x ]                  No  [   ]                  If No, reason:  4.Ecasa:  Yes  [  x ]                  No   [   ]                  If No, reason:  Follow Up Appointments: Follow-up Information    Jacolyn Reedy, MD. Go on 01/31/2018.   Specialty:  Cardiology Contact information: [address] [place] [address] [phone]        Grace Isaac, MD. Go on 02/20/2018.   Specialty:  Cardiothoracic Surgery Why:  PA/LAT CXR to be taken (at [facility] which is in the same building as Dr. Everrett Coombe office) on 02/20/2018 at 1:00 pm ;Appointment time is at 1:30 pm Contact information: [address] [place] [address] [phone]           Signed: Sharalyn Ink [facility] 01/19/2018, 8:51 AM

## 2018-01-14 ENCOUNTER — Inpatient Hospital Stay (HOSPITAL_COMMUNITY): Payer: BLUE CROSS/BLUE SHIELD

## 2018-01-14 DIAGNOSIS — I209 Angina pectoris, unspecified: Secondary | ICD-10-CM

## 2018-01-14 LAB — CBC
HCT: 27.7 % — ABNORMAL LOW (ref 36.0–46.0)
Hemoglobin: 9.1 g/dL — ABNORMAL LOW (ref 12.0–15.0)
MCH: 27.1 pg (ref 26.0–34.0)
MCHC: 32.9 g/dL (ref 30.0–36.0)
MCV: 82.4 fL (ref 78.0–100.0)
Platelets: 151 10*3/uL (ref 150–400)
RBC: 3.36 MIL/uL — ABNORMAL LOW (ref 3.87–5.11)
RDW: 15.5 % (ref 11.5–15.5)
WBC: 13.4 10*3/uL — ABNORMAL HIGH (ref 4.0–10.5)

## 2018-01-14 LAB — URINALYSIS, ROUTINE W REFLEX MICROSCOPIC
BILIRUBIN URINE: NEGATIVE
Bacteria, UA: NONE SEEN
GLUCOSE, UA: NEGATIVE mg/dL
KETONES UR: 20 mg/dL — AB
Leukocytes, UA: NEGATIVE
Nitrite: NEGATIVE
PH: 8 (ref 5.0–8.0)
PROTEIN: NEGATIVE mg/dL
Specific Gravity, Urine: 1.017 (ref 1.005–1.030)

## 2018-01-14 LAB — BASIC METABOLIC PANEL
Anion gap: 10 (ref 5–15)
BUN: 11 mg/dL (ref 6–20)
CO2: 29 mmol/L (ref 22–32)
Calcium: 8.8 mg/dL — ABNORMAL LOW (ref 8.9–10.3)
Chloride: 96 mmol/L — ABNORMAL LOW (ref 98–111)
Creatinine, Ser: 0.54 mg/dL (ref 0.44–1.00)
GFR calc Af Amer: >60 mL/min (ref >60)
GFR calc non Af Amer: >60 mL/min (ref >60)
Glucose, Bld: 111 mg/dL — ABNORMAL HIGH (ref 70–99)
Potassium: 4.4 mmol/L (ref 3.5–5.1)
Sodium: 135 mmol/L (ref 135–145)

## 2018-01-14 LAB — GLUCOSE, CAPILLARY: Glucose-Capillary: 110 mg/dL — ABNORMAL HIGH (ref 70–99)

## 2018-01-14 IMAGING — DX DG CHEST 1V PORT
1 series · 1 of 1 positions shown · non-contrast
Comparison: [DATE]

CLINICAL DATA: Fever. Coronary bypass surgery on [DATE].

EXAM:
PORTABLE CHEST 1 VIEW

[chest]
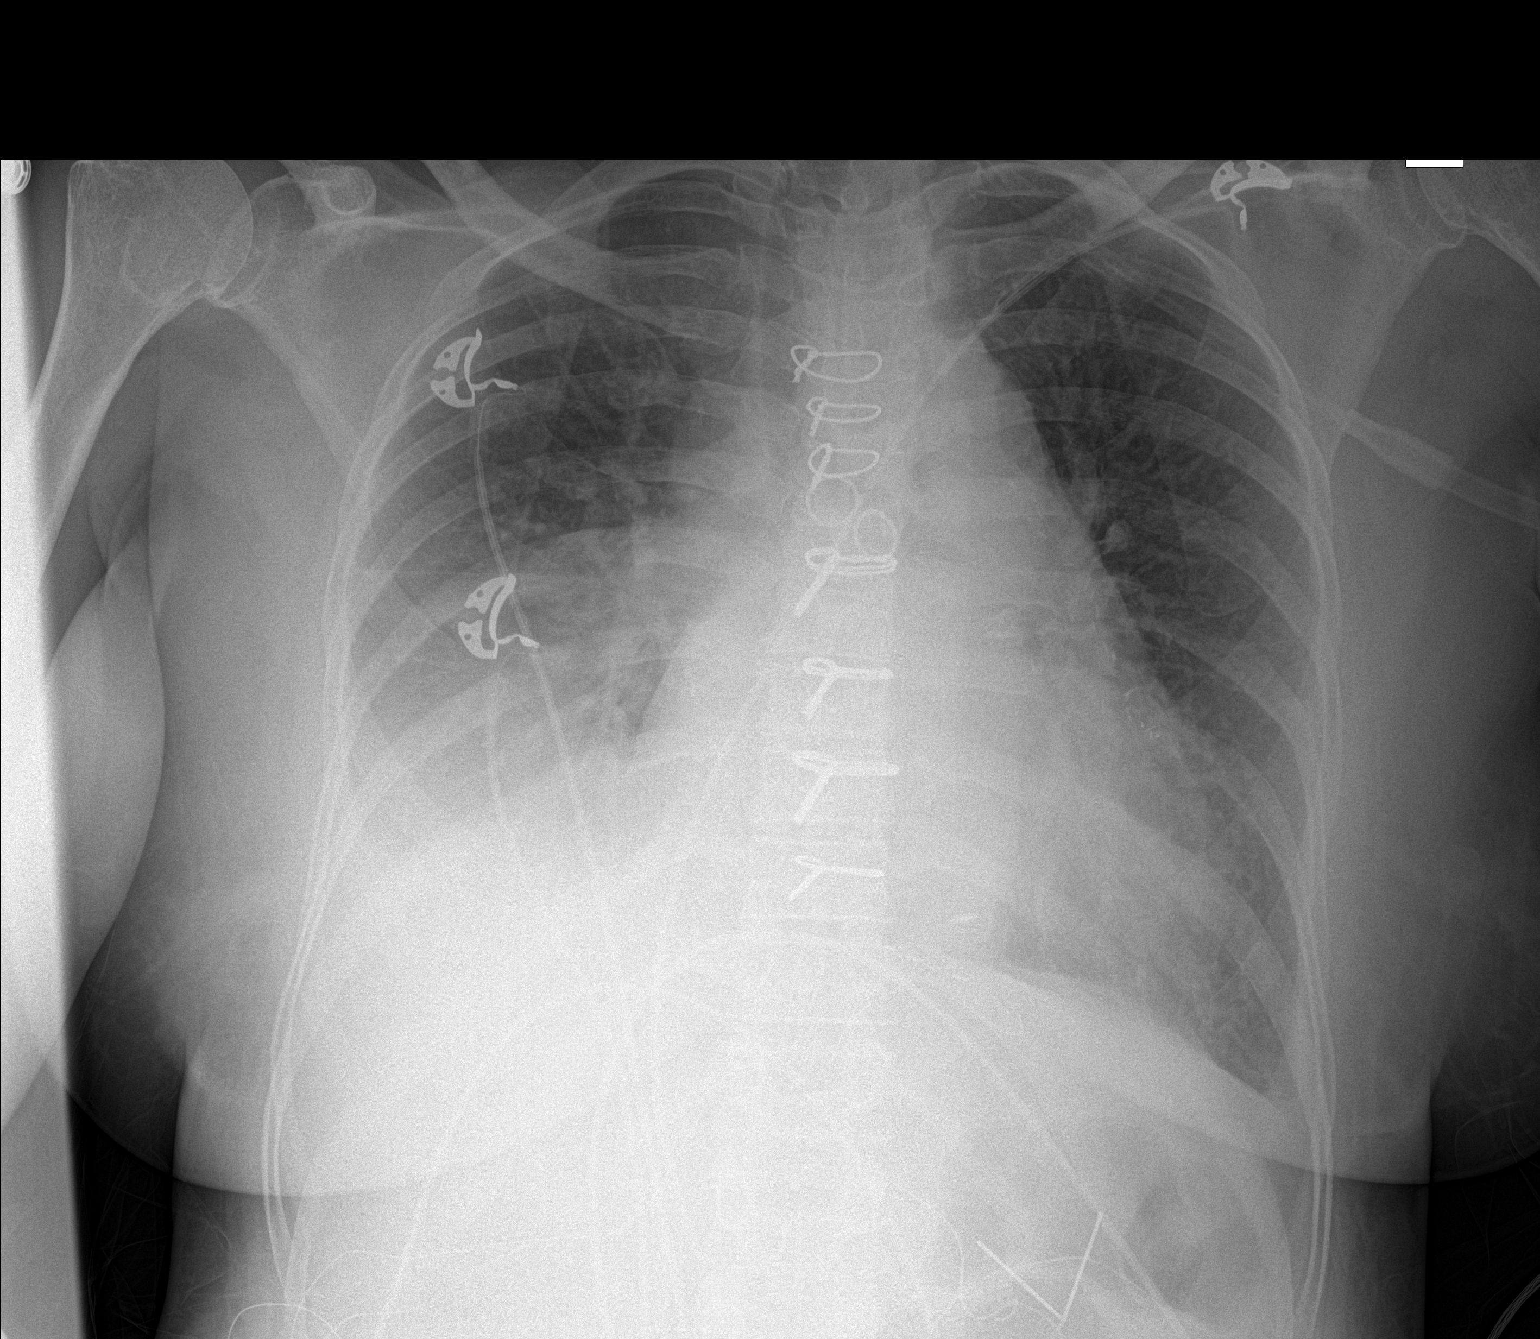

[1 of 1 positions shown; findings below may reference images not displayed]

FINDINGS: Postoperative changes in the mediastinum. Interval removal of right
central venous catheter. Cardiac enlargement with mild vascular
congestion. Persistent infiltration or atelectasis in the right lung
base with small right pleural effusion. No pneumothorax. Mediastinal
contours appear intact.
IMPRESSION: Cardiac enlargement with mild vascular congestion. Persistent
infiltration or atelectasis in the right lung base with small right
pleural effusion.

## 2018-01-14 MED ORDER — VANCOMYCIN HCL IN DEXTROSE 1-5 GM/200ML-% IV SOLN
1000.0000 mg | Freq: Once | INTRAVENOUS | Status: AC
  Administered 2018-01-14: 1000 mg via INTRAVENOUS
  Filled 2018-01-14: qty 200

## 2018-01-14 MED ORDER — LEVALBUTEROL HCL 0.63 MG/3ML IN NEBU
INHALATION_SOLUTION | RESPIRATORY_TRACT | Status: AC
  Administered 2018-01-14: 0.63 mg via RESPIRATORY_TRACT
  Filled 2018-01-14: qty 3

## 2018-01-14 MED ORDER — LEVALBUTEROL HCL 0.63 MG/3ML IN NEBU
0.6300 mg | INHALATION_SOLUTION | Freq: Three times a day (TID) | RESPIRATORY_TRACT | Status: DC
  Administered 2018-01-14 – 2018-01-15 (×5): 0.63 mg via RESPIRATORY_TRACT
  Filled 2018-01-14 (×4): qty 3

## 2018-01-14 MED ORDER — VANCOMYCIN HCL IN DEXTROSE 750-5 MG/150ML-% IV SOLN
750.0000 mg | Freq: Two times a day (BID) | INTRAVENOUS | Status: DC
  Administered 2018-01-15 – 2018-01-17 (×5): 750 mg via INTRAVENOUS
  Filled 2018-01-14 (×6): qty 150

## 2018-01-14 MED ORDER — METOPROLOL TARTRATE 25 MG PO TABS
25.0000 mg | ORAL_TABLET | Freq: Once | ORAL | Status: AC
  Administered 2018-01-14: 25 mg via ORAL
  Filled 2018-01-14: qty 1

## 2018-01-14 MED ORDER — ACETAMINOPHEN 325 MG PO TABS
650.0000 mg | ORAL_TABLET | ORAL | Status: DC | PRN
  Administered 2018-01-14 – 2018-01-15 (×2): 650 mg via ORAL
  Filled 2018-01-14 (×2): qty 2

## 2018-01-14 MED ORDER — LEVOFLOXACIN IN D5W 750 MG/150ML IV SOLN
750.0000 mg | INTRAVENOUS | Status: DC
  Administered 2018-01-14 – 2018-01-16 (×3): 750 mg via INTRAVENOUS
  Filled 2018-01-14 (×4): qty 150

## 2018-01-14 NOTE — Progress Notes (Signed)
Pharmacy Antibiotic Note  Patience Diana [name] is a 62 y.o. female admitted on 01/05/2018 with pneumonia.  Pharmacy has been consulted for vancomycin and levofloxacin dosing.  Patient having productive cough (hx of bronchitis), currently POD 4 of CABG. WBC increased to 13.4. Last dose of peri-op abx on 8/14.  Afebrile over past 24 hours. CXR today showing persistent infiltration/atelectasis in R lung base w/ small R pleural effusion. Scr 0.54 (CrCl ~62 mL/min).   Has listed allergy to PCN- however, has received cefdinir as outpatient in past upon chart review. Previous MRSA PCR was negative on 8/12.   Plan: Vancomycin 1000 mg IV once  Vancomycin 750 mg IV every 12 hours.  Goal trough 15-20 mcg/mL. Levofloxacin 750 mg IV every 24 hours  Monitor renal fx, cx results, clinical pic, and VT as indicated Monitor LOT and opportunities for de-escalation   Height: 5\' 3"  (160 cm) Weight: 138 lb 0.1 oz (62.6 kg) IBW/kg (Calculated) : 52.4  Temp (24hrs), Avg:100.2 F (37.9 C), Min:99 F (37.2 C), Max:102.2 F (39 C)  Recent Labs  Lab 01/11/18 0354 01/11/18 1612 01/11/18 1626 01/12/18 0355 01/13/18 0426 01/14/18 0346  WBC 10.9* 13.1*  --  19.8* 16.1* 13.4*  CREATININE 0.50 0.57 0.40* 0.82 0.53 0.54    Estimated Creatinine Clearance: 61.9 mL/min (by C-G formula based on SCr of 0.54 mg/dL).    Allergies  Allergen Reactions  . Contrast Media [Iodinated Diagnostic Agents] Anaphylaxis    Had CT scan 12/14/17, had trouble breathing, throat swelled  . Penicillins Rash and Other (See Comments)    PATIENT HAS HAD A PCN REACTION WITH IMMEDIATE RASH, FACIAL/TONGUE/THROAT SWELLING, SOB, OR LIGHTHEADEDNESS WITH HYPOTENSION:  #  #  YES  #  #  Has patient had a PCN reaction causing severe rash involving mucus membranes or skin necrosis:  no Has patient had a PCN reaction that required hospitalization:  no Has patient had a PCN reaction occurring within the last 10 years: no   . Codeine Anxiety   Dizziness and headache    Antimicrobials this admission: Vancomycin 8/13, 8/17 >>  Levaquin 8/13>>8/14, 8/17 >>   Dose adjustments this admission: N/A  Microbiology results: 8/17 BCx: sent 8/17 UCx: sent  8/17 Sputum: ordered  8/12 MRSA PCR: negative  Thank you for allowing pharmacy to be a part of this patient's care.  Clarnce FlockKimberly A Perkins 01/14/2018 12:17 PM

## 2018-01-14 NOTE — Progress Notes (Signed)
Pt's HR sustaining 130s at rest. BP 114/78. PA notified. Obtained order for one time PO dose of lopressor 25mg . Verified that pt will still receive tonight's dose of lopressor as well. Will continue to monitor and pass along to night shift RN.   Ardeen JourdainLauren Ruthe Roemer BSN, RN

## 2018-01-14 NOTE — Progress Notes (Addendum)
[address]       [place] [id number]             [phone]      4 Days Post-Op Procedure(s) (LRB): CORONARY ARTERY BYPASS GRAFTING (CABG) TIMES 4, USING LEFT INTERNAL MAMMARY ARTERY TO OM1  AND ENDOSCOPICALLY HARVESTED RIGHT SAPHENOUS VEIN TO DIAGONAL, AND SEQUENTIALLY TO OM2 AND DISTAL CIRCUMFLEX (N/A) TRANSESOPHAGEAL ECHOCARDIOGRAM (TEE) (N/A) Subjective: Feels fairly poorly, some productive cough Says she gets bronchitis fairly frequently  Objective: Vital signs in last 24 hours: Temp:  [97.8 F (36.6 C)-102.2 F (39 C)] 99 F (37.2 C) (08/17 0735) Pulse Rate:  [107-133] 127 (08/17 0418) Cardiac Rhythm: Sinus tachycardia (08/17 0700) Resp:  [17-33] 29 (08/17 0418) BP: (106-161)/(77-105) 134/95 (08/17 0418) SpO2:  [94 %-100 %] 99 % (08/17 0418) Weight:  [62.6 kg-62.7 kg] 62.6 kg (08/17 0330)  Hemodynamic parameters for last 24 hours:    Intake/Output from previous day: 08/16 0701 - 08/17 0700 In: 465 [P.O.:465] Out: -  Intake/Output this shift: No intake/output data recorded.  General appearance: alert, cooperative and no distress Heart: regular rate and rhythm and tachy Lungs: dim on right lower fields Abdomen: benign Extremities: some edema Wound: incis healing well, sternum stable  Lab Results: Recent Labs    01/13/18 0426 01/14/18 0346  WBC 16.1* 13.4*  HGB 9.0* 9.1*  HCT 28.6* 27.7*  PLT 108* 151   BMET:  Recent Labs    01/13/18 0426 01/14/18 0346  NA 134* 135  K 4.3 4.4  CL 99 96*  CO2 28 29  GLUCOSE 121* 111*  BUN 16 11  CREATININE 0.53 0.54  CALCIUM 8.4* 8.8*    PT/INR: No results for input(s): LABPROT, INR in the last 72 hours. ABG    Component Value Date/Time   PHART 7.431 01/10/2018 2009   HCO3 24.1 01/10/2018 2009   TCO2 26 01/11/2018 1626   ACIDBASEDEF 1.0 01/10/2018 1835   O2SAT 97.0 01/10/2018 2009   CBG (last 3)  Recent Labs    01/13/18 1946 01/13/18 2243 01/14/18 0630  GLUCAP 109* 94 110*     Meds Scheduled Meds: . aspirin EC  325 mg Oral Daily  . atorvastatin  80 mg Oral q1800  . Chlorhexidine Gluconate Cloth  6 each Topical Daily  . docusate sodium  200 mg Oral Daily  . enoxaparin (LOVENOX) injection  30 mg Subcutaneous Daily  . ferrous sulfate  325 mg Oral BID WC  . folic acid  1 mg Oral Daily  . furosemide  20 mg Oral Daily  . insulin aspart  0-24 Units Subcutaneous TID AC & HS  . lisinopril  2.5 mg Oral Daily  . mouth rinse  15 mL Mouth Rinse BID  . metoprolol tartrate  25 mg Oral BID  . pantoprazole  40 mg Oral QAC breakfast  . potassium chloride  10 mEq Oral Daily  . pregabalin  75 mg Oral BID  . sodium chloride flush  3 mL Intravenous Q12H   Continuous Infusions: . sodium chloride     PRN Meds:.sodium chloride, acetaminophen, bisacodyl **OR** bisacodyl, guaiFENesin, ondansetron **OR** ondansetron (ZOFRAN) IV, oxyCODONE, sodium chloride flush, traMADol  Xrays Dg Chest Port 1 View  Result Date: 01/14/2018 CLINICAL DATA:  Fever. Coronary bypass surgery on 01/10/2018. EXAM: PORTABLE CHEST 1 VIEW COMPARISON:  01/13/2018 FINDINGS: Postoperative changes in the mediastinum. Interval removal of right central venous catheter. Cardiac enlargement with mild vascular congestion. Persistent infiltration or atelectasis in the  right lung base with small right pleural effusion. No pneumothorax. Mediastinal contours appear intact. IMPRESSION: Cardiac enlargement with mild vascular congestion. Persistent infiltration or atelectasis in the right lung base with small right pleural effusion. Electronically Signed   By: Burman NievesWilliam  Stevens M.D.   On: 01/14/2018 05:35   Dg Chest Port 1 View  Result Date: 01/13/2018 CLINICAL DATA:  Pneumothorax, chest soreness.  Postop. EXAM: PORTABLE CHEST 1 VIEW COMPARISON:  01/12/2018 FINDINGS: Interval removal of left chest tube. No visible pneumothorax. Cardiomegaly with vascular congestion. Right lower lobe airspace opacity with layering right  effusion, similar to prior study. No confluent opacity on the left. IMPRESSION: Cardiomegaly with vascular congestion. Right lower lobe atelectasis or infiltrate with layering right effusion. No significant change. Electronically Signed   By: Charlett NoseKevin  Dover M.D.   On: 01/13/2018 09:16    Results for orders placed or performed during the hospital encounter of 01/05/18  Surgical PCR screen     Status: None   Collection Time: 01/09/18  3:49 PM  Result Value Ref Range Status   MRSA, PCR NEGATIVE NEGATIVE Final   Staphylococcus aureus NEGATIVE NEGATIVE Final    Comment: (NOTE) The Xpert SA Assay (FDA approved for NASAL specimens in patients 60 years of age and older), is one component of a comprehensive surveillance program. It is not intended to diagnose infection nor to guide or monitor treatment. Performed at [facility], [address] [place], [place] [address]     Assessment/Plan: S/P Procedure(s) (LRB): CORONARY ARTERY BYPASS GRAFTING (CABG) TIMES 4, USING LEFT INTERNAL MAMMARY ARTERY TO OM1  AND ENDOSCOPICALLY HARVESTED RIGHT SAPHENOUS VEIN TO DIAGONAL, AND SEQUENTIALLY TO OM2 AND DISTAL CIRCUMFLEX (N/A) TRANSESOPHAGEAL ECHOCARDIOGRAM (TEE) (N/A)    LOS: 9 days   1 fevers, tmax 102.2, reports productive cough. Has frequent bronchitis per patient report. Leukocytosis is trending down, does have infiltrate/atx on right . UA is unremarkable , cultures pending. Add nebs and flutter valve 2 hemodyn stable except tachy, fever and tachycardia do raise some concern for PE if infectious source isn't identified so may need to evaluate further, will check 12 lead to see if any PE suspicious findings can be identified like S1Q3T3, etc 3 cont diuresis, UOP is not accurate 4 cont rehab 5 H/H is pretty stable 6 renal fxn is normal BS ok, A1C is 5.6, d/c insulin  Rowe ClackWayne E Gold 01/14/2018 Pager [phone]   I have seen and examined the patient and agree with the assessment and plan as  outlined.  Significant fevers >102.  Productive cough.  CXR w/ small right pleural effusion and RLL opacity.  Start empiric Vanc and Levaquin.  Check sputum Cx  Purcell Nailslarence H Jianna Drabik, MD 01/14/2018 12:03 PM

## 2018-01-14 NOTE — Progress Notes (Signed)
Pt 's temperature is greater than 101.5 f, MD on call notified new order received.

## 2018-01-15 LAB — URINE CULTURE

## 2018-01-15 MED ORDER — CYCLOBENZAPRINE HCL 10 MG PO TABS: 5.0000 mg | ORAL_TABLET | Freq: Three times a day (TID) | ORAL | Status: DC | PRN

## 2018-01-15 MED ORDER — METOPROLOL TARTRATE 50 MG PO TABS
50.0000 mg | ORAL_TABLET | Freq: Two times a day (BID) | ORAL | Status: DC
Start: 2018-01-15 — End: 2018-01-16
  Administered 2018-01-15 – 2018-01-16 (×3): 50 mg via ORAL
  Filled 2018-01-15 (×3): qty 1

## 2018-01-15 MED ORDER — CYCLOBENZAPRINE HCL 10 MG PO TABS
10.0000 mg | ORAL_TABLET | Freq: Three times a day (TID) | ORAL | Status: DC | PRN
  Administered 2018-01-15 – 2018-01-18 (×10): 10 mg via ORAL
  Filled 2018-01-15 (×10): qty 1

## 2018-01-15 MED ORDER — LEVALBUTEROL HCL 0.63 MG/3ML IN NEBU: 0.6300 mg | INHALATION_SOLUTION | Freq: Four times a day (QID) | RESPIRATORY_TRACT | Status: DC | PRN

## 2018-01-15 NOTE — Progress Notes (Addendum)
[address]       [place] [id number]             [phone]      5 Days Post-Op Procedure(s) (LRB): CORONARY ARTERY BYPASS GRAFTING (CABG) TIMES 4, USING LEFT INTERNAL MAMMARY ARTERY TO OM1  AND ENDOSCOPICALLY HARVESTED RIGHT SAPHENOUS VEIN TO DIAGONAL, AND SEQUENTIALLY TO OM2 AND DISTAL CIRCUMFLEX (N/A) TRANSESOPHAGEAL ECHOCARDIOGRAM (TEE) (N/A) Subjective: Feels much better, HR control is improving Requesting flexeril  to be restarted  Objective: Vital signs in last 24 hours: Temp:  [98.1 F (36.7 C)-98.9 F (37.2 C)] 98.9 F (37.2 C) (08/18 0554) Pulse Rate:  [87-137] 120 (08/18 0010) Cardiac Rhythm: Sinus tachycardia (08/18 0700) Resp:  [17-37] 32 (08/18 0554) BP: (114-142)/(78-90) 142/90 (08/18 0554) SpO2:  [95 %-100 %] 99 % (08/18 0554) Weight:  [60.8 kg] 60.8 kg (08/18 0500)  Hemodynamic parameters for last 24 hours:    Intake/Output from previous day: 08/17 0701 - 08/18 0700 In: 470.9 [P.O.:120; IV Piggyback:350.9] Out: 700 [Urine:700] Intake/Output this shift: No intake/output data recorded.  General appearance: alert, cooperative and no distress Heart: regular rate and rhythm and tachy Lungs: dim in right lower fields Abdomen: benign Extremities: minor edema Wound: incis healing well  Lab Results: Recent Labs    01/13/18 0426 01/14/18 0346  WBC 16.1* 13.4*  HGB 9.0* 9.1*  HCT 28.6* 27.7*  PLT 108* 151   BMET:  Recent Labs    01/13/18 0426 01/14/18 0346  NA 134* 135  K 4.3 4.4  CL 99 96*  CO2 28 29  GLUCOSE 121* 111*  BUN 16 11  CREATININE 0.53 0.54  CALCIUM 8.4* 8.8*    PT/INR: No results for input(s): LABPROT, INR in the last 72 hours. ABG    Component Value Date/Time   PHART 7.431 01/10/2018 2009   HCO3 24.1 01/10/2018 2009   TCO2 26 01/11/2018 1626   ACIDBASEDEF 1.0 01/10/2018 1835   O2SAT 97.0 01/10/2018 2009   CBG (last 3)  Recent Labs    01/13/18 1946 01/13/18 2243 01/14/18 0630  GLUCAP 109*  94 110*    Meds Scheduled Meds: . aspirin EC  325 mg Oral Daily  . atorvastatin  80 mg Oral q1800  . Chlorhexidine Gluconate Cloth  6 each Topical Daily  . docusate sodium  200 mg Oral Daily  . enoxaparin (LOVENOX) injection  30 mg Subcutaneous Daily  . ferrous sulfate  325 mg Oral BID WC  . folic acid  1 mg Oral Daily  . furosemide  20 mg Oral Daily  . levalbuterol  0.63 mg Nebulization TID  . lisinopril  2.5 mg Oral Daily  . mouth rinse  15 mL Mouth Rinse BID  . metoprolol tartrate  25 mg Oral BID  . pantoprazole  40 mg Oral QAC breakfast  . potassium chloride  10 mEq Oral Daily  . pregabalin  75 mg Oral BID  . sodium chloride flush  3 mL Intravenous Q12H   Continuous Infusions: . sodium chloride    . levofloxacin (LEVAQUIN) IV 750 mg (01/14/18 1249)  . vancomycin 750 mg (01/15/18 0104)   PRN Meds:.sodium chloride, acetaminophen, bisacodyl **OR** bisacodyl, guaiFENesin, ondansetron **OR** ondansetron (ZOFRAN) IV, oxyCODONE, sodium chloride flush, traMADol  Xrays Dg Chest Port 1 View  Result Date: 01/14/2018 CLINICAL DATA:  Fever. Coronary bypass surgery on 01/10/2018. EXAM: PORTABLE CHEST 1 VIEW COMPARISON:  01/13/2018 FINDINGS: Postoperative changes in the mediastinum. Interval removal of right central venous catheter.  Cardiac enlargement with mild vascular congestion. Persistent infiltration or atelectasis in the right lung base with small right pleural effusion. No pneumothorax. Mediastinal contours appear intact. IMPRESSION: Cardiac enlargement with mild vascular congestion. Persistent infiltration or atelectasis in the right lung base with small right pleural effusion. Electronically Signed   By: Burman NievesWilliam  Stevens M.D.   On: 01/14/2018 05:35   Results for orders placed or performed during the hospital encounter of 01/05/18  Surgical PCR screen     Status: None   Collection Time: 01/09/18  3:49 PM  Result Value Ref Range Status   MRSA, PCR NEGATIVE NEGATIVE Final    Staphylococcus aureus NEGATIVE NEGATIVE Final    Comment: (NOTE) The Xpert SA Assay (FDA approved for NASAL specimens in patients 60 years of age and older), is one component of a comprehensive surveillance program. It is not intended to diagnose infection nor to guide or monitor treatment. Performed at [facility], [address] [place], [place] [address]    Assessment/Plan: S/P Procedure(s) (LRB): CORONARY ARTERY BYPASS GRAFTING (CABG) TIMES 4, USING LEFT INTERNAL MAMMARY ARTERY TO OM1  AND ENDOSCOPICALLY HARVESTED RIGHT SAPHENOUS VEIN TO DIAGONAL, AND SEQUENTIALLY TO OM2 AND DISTAL CIRCUMFLEX (N/A) TRANSESOPHAGEAL ECHOCARDIOGRAM (TEE) (N/A)   1 appears to have defervesced. Cont levaquin and vanco. Will recheck CXR and WBC in am and if ok, transition to po abx. Cont to push pulm toilet/rehab/nebs 2 cont gentle diuresis for mild volume overload 3 HR and BP elevated at times- she should tol a higher beta blocker dose- will adjust 4 BS ok control 5 will restart prn flexeril for chronic neck/shoulder issues     LOS: 10 days    Rowe ClackWayne E Gold 01/15/2018  I have seen and examined the patient and agree with the assessment and plan as outlined.  No more fevers.  Clinically looks good.  Purcell Nailslarence H Kit Mollett, MD 01/15/2018 11:32 AM

## 2018-01-15 NOTE — Op Note (Signed)
NAME: Diana [name], Tallyn M. MEDICAL RECORD WU:[id number] ACCOUNT [id number] DATE OF BIRTH:11/22/1955 FACILITY: [facility] LOCATION: [facility] PHYSICIAN:Taitum Menton Bari EdwardB. Arturo Sofranko, MD  OPERATIVE REPORT  DATE OF PROCEDURE:  01/10/2018  PREOPERATIVE DIAGNOSIS:  Coronary occlusive disease with recent non-ST elevation myocardial infarction.  POSTOPERATIVE DIAGNOSIS:  Coronary occlusive disease with recent non-ST elevation myocardial infarction.  SURGICAL PROCEDURE:  The patient is a 62 year old diabetic female who presents with new onset of chest pain and elevated troponin.  Cardiac catheterization was performed which demonstrated severe 3-vessel coronary artery disease.  The patient had a right  dominant system.  The LAD was very diffusely diseased and not bypassable.  The primary supplier of the left heart was a large obtuse marginal vessel.  Because of the patient's symptoms, diabetes, and significant high-grade stenosis of the OM1, OM2 and  distal circumflex vessels, coronary artery bypass grafting was recommended to the patient, who agreed and signed informed consent.  DESCRIPTION OF PROCEDURE:  With Swan-Ganz arterial line monitors placed, the patient underwent general endotracheal anesthesia without incident.  Skin of the chest and legs was prepped with Betadine and draped in the usual sterile manner.  Appropriate  timeout was performed, and then we proceeded with harvesting of the right greater saphenous vein endoscopically.  After 1 segment of vein was harvested in the area of the knee, the vein became small and unusual.  Additional segment of the left greater  saphenous vein from the left thigh was obtained endoscopically.  A median sternotomy was performed.  The left internal mammary artery was dissected down as a pedicle graft.  The distal artery had good free flow.  The vessel was hydrostatically dilated  with heparinized saline.  The pericardium was opened.  Overall, ventricular function appeared preserved.   The patient was systemically heparinized.  The ascending aorta was cannulated.  The left atrium was cannulated.  An aortic root vent cardioplegia  needle was introduced into the ascending aorta.  The patient was placed on cardiopulmonary bypass, 2.4 L/min per sq m.  Sites of anastomosis were dissected out of the epicardium.  The patient's body temperature was cooled to 32 degrees.  Aortic  crossclamp was applied, and 500 mL of cold blood potassium cardioplegia was administered with diastolic arrest of the heart.  Myocardial septal temperatures were monitored throughout the crossclamp.  Attention was turned first to the distal circumflex  and the second obtuse marginal.  The obtuse marginal vessels were intramyocardial.  Using the second obtuse marginal, the vessel was opened, and using a diamond-type side-to-side anastomosis with a running 8-0 Prolene, a distal anastomosis was performed.   The distal extent of the same vein was then carried to the distal circumflex branch which was also relatively small and admitted a 1 mm probe.  Using a running 8-0 Prolene, distal anastomosis was performed. because the patient's distal LAD was  basically nonexistent and very diffusely disease throughout, we elected to place the left internal mammary artery to the very dominant first obtuse marginal vessel.  This vessel was opened and admitted a 1.5 mm probe.  Using a running 8-0 Prolene, distal  anastomosis was performed.  We then turned our attention to the diagonal vessel, which was 1.2 mm in size.  Using a running 7-0 Prolene, distal anastomosis was performed with a segment of reverse saphenous vein graft.  With the crossclamp still in  place, 2 punch aortotomies were performed, and each of the 2 vein grafts were anastomosed to the ascending aorta.  The bulldog on the mammary  artery was removed with obvious perfusion of the lateral wall.  The heart was allowed to passively fill and  deair.  The 2 proximal anastomoses  were completed and aortic crossclamp was removed with total crossclamp time of 112 minutes.  The patient spontaneously converted to a sinus rhythm.  Sites of anastomosis were inspected and free of bleeding.  The  patient's body temperature was rewarmed to 37 degrees.  She was then ventilated and weaned from cardiopulmonary bypass without difficulty.  She remained hemodynamically stable.  She was decannulated in the usual fashion.  Protamine sulfate was  administered with operative field hemostatic.  Atrial and ventricular pacing wires were applied.  Graft markers were applied.  A left pleural tube and Blake mediastinal drain were left in place.  The patient did require 2 packed red blood cells because  of low hematocrit preoperatively and low hematocrit while on bypass.  Total pump time was 141 minutes.  Sponge and needle count was reported as correct at the completion of procedure.  The patient tolerated the procedure without obvious complication and  was transferred to the surgical intensive care unit for further postoperative care.  LN/NUANCE  D:01/15/2018 T:01/15/2018 JOB:[id number]

## 2018-01-16 ENCOUNTER — Other Ambulatory Visit (HOSPITAL_COMMUNITY): Payer: Self-pay

## 2018-01-16 LAB — BASIC METABOLIC PANEL
ANION GAP: 11 (ref 5–15)
BUN: 10 mg/dL (ref 6–20)
CALCIUM: 9.2 mg/dL (ref 8.9–10.3)
CO2: 27 mmol/L (ref 22–32)
Chloride: 97 mmol/L — ABNORMAL LOW (ref 98–111)
Creatinine, Ser: 0.67 mg/dL (ref 0.44–1.00)
Glucose, Bld: 110 mg/dL — ABNORMAL HIGH (ref 70–99)
Potassium: 3.9 mmol/L (ref 3.5–5.1)
SODIUM: 135 mmol/L (ref 135–145)

## 2018-01-16 LAB — CBC WITH DIFFERENTIAL/PLATELET
Abs Immature Granulocytes: 0.4 10*3/uL — ABNORMAL HIGH (ref 0.0–0.1)
BASOS ABS: 0 10*3/uL (ref 0.0–0.1)
Basophils Relative: 0 %
EOS ABS: 0.2 10*3/uL (ref 0.0–0.7)
EOS PCT: 2 %
HCT: 28.7 % — ABNORMAL LOW (ref 36.0–46.0)
Hemoglobin: 9.2 g/dL — ABNORMAL LOW (ref 12.0–15.0)
IMMATURE GRANULOCYTES: 4 %
LYMPHS PCT: 11 %
Lymphs Abs: 1.1 10*3/uL (ref 0.7–4.0)
MCH: 26.4 pg (ref 26.0–34.0)
MCHC: 32.1 g/dL (ref 30.0–36.0)
MCV: 82.5 fL (ref 78.0–100.0)
Monocytes Absolute: 1.2 10*3/uL — ABNORMAL HIGH (ref 0.1–1.0)
Monocytes Relative: 12 %
NEUTROS PCT: 71 %
Neutro Abs: 7 10*3/uL (ref 1.7–7.7)
Platelets: 241 10*3/uL (ref 150–400)
RBC: 3.48 MIL/uL — AB (ref 3.87–5.11)
RDW: 15.9 % — ABNORMAL HIGH (ref 11.5–15.5)
WBC: 9.9 10*3/uL (ref 4.0–10.5)

## 2018-01-16 MED ORDER — ENOXAPARIN SODIUM 40 MG/0.4ML ~~LOC~~ SOLN
40.0000 mg | Freq: Every day | SUBCUTANEOUS | Status: DC
  Administered 2018-01-17 – 2018-01-19 (×3): 40 mg via SUBCUTANEOUS
  Filled 2018-01-16 (×3): qty 0.4

## 2018-01-16 MED ORDER — POTASSIUM CHLORIDE CRYS ER 20 MEQ PO TBCR
20.0000 meq | EXTENDED_RELEASE_TABLET | Freq: Once | ORAL | Status: AC
  Administered 2018-01-16: 20 meq via ORAL
  Filled 2018-01-16: qty 1

## 2018-01-16 MED ORDER — METOPROLOL TARTRATE 50 MG PO TABS
50.0000 mg | ORAL_TABLET | Freq: Three times a day (TID) | ORAL | Status: DC
  Administered 2018-01-16 – 2018-01-18 (×4): 50 mg via ORAL
  Filled 2018-01-16 (×6): qty 1

## 2018-01-16 NOTE — Progress Notes (Addendum)
      [address]       [place] [id number]             [phone]        6 Days Post-Op Procedure(s) (LRB): CORONARY ARTERY BYPASS GRAFTING (CABG) TIMES 4, USING LEFT INTERNAL MAMMARY ARTERY TO OM1  AND ENDOSCOPICALLY HARVESTED RIGHT SAPHENOUS VEIN TO DIAGONAL, AND SEQUENTIALLY TO OM2 AND DISTAL CIRCUMFLEX (N/A) TRANSESOPHAGEAL ECHOCARDIOGRAM (TEE) (N/A)  Subjective: Patient states she feels better this am. Has occasional cough.  Objective: Vital signs in last 24 hours: Temp:  [100.7 F (38.2 C)] 100.7 F (38.2 C) (08/18 2000) Pulse Rate:  [124-138] 138 (08/18 2027) Cardiac Rhythm: Sinus tachycardia (08/18 2014) Resp:  [15-28] 28 (08/18 2000) BP: (138-148)/(86-90) 139/86 (08/18 2000) SpO2:  [94 %-97 %] 97 % (08/18 2000) Weight:  [59.9 kg] 59.9 kg (08/19 0500)  Pre op weight 61.2 kg Current Weight  01/16/18 59.9 kg      Intake/Output from previous day: 08/18 0701 - 08/19 0700 In: -  Out: 1050 [Urine:1050]   Physical Exam:  Cardiovascular: RRR Pulmonary: Slightly diminished right base Abdomen: Soft, non tender, bowel sounds present. Extremities: No lower extremity edema. Wounds: Clean and dry.  No erythema or signs of infection.  Lab Results: CBC: Recent Labs    01/14/18 0346 01/16/18 0524  WBC 13.4* 9.9  HGB 9.1* 9.2*  HCT 27.7* 28.7*  PLT 151 241   BMET:  Recent Labs    01/14/18 0346 01/16/18 0524  NA 135 135  K 4.4 3.9  CL 96* 97*  CO2 29 27  GLUCOSE 111* 110*  BUN 11 10  CREATININE 0.54 0.67  CALCIUM 8.8* 9.2    PT/INR:  Lab Results  Component Value Date   INR 1.48 01/10/2018   INR 0.99 01/06/2018   INR 0.99 10/30/2016   ABG:  INR: Will add last result for INR, ABG once components are confirmed Will add last 4 CBG results once components are confirmed  Assessment/Plan: 1. CV - Continues to have tachycarda with HR in the 120's this am.  On Lopressor 50 mg bid and  low dose Lisinopril 2.5 mg daily. Will ask for  recommendations from Dr. Donnie Ahoilley her cardiologist. 2.  Pulmonary - On room air. Encourage incentive spirometer. 3. Supplement potassium 4.  Acute blood loss anemia - H and H stable at 9.2 and 28.7. Continue ferrous sulfate and folic acid 5. ID-on Levaquin and Vancomycin for probable pulmonary etiology (?bronchitis vs PNA). Last fever to 100.7 08/18 and WBC decreased to 9,900. Blood culture no growth to date, urine with multiple organisms so likely contaminated. Will discuss with Dr. Tyrone SageGerhardt when to transition to oral antibiotic    Diana [name] 01/16/2018,7:12 AM [phone]  persistent tachycardia BP stable - appears sinus not flutter  Wbc decrease No fever today  Follow up echo today per cardiology I have seen and examined Diana [name] and agree with the above assessment  and plan.  Delight OvensEdward B Ahmere Hemenway MD Beeper [phone] Office [phone] 01/16/2018 2:12 PM

## 2018-01-16 NOTE — Progress Notes (Signed)
CARDIAC REHAB PHASE I   Came to ambulate pt. Pts HR 126 sitting in room. RN asked if we give her some time for beta blocker to work. Will return as able.   Reynold Boweneresa  Zenith Kercheval, RN BSN 01/16/2018 10:00 AM

## 2018-01-16 NOTE — Progress Notes (Signed)
CARDIAC REHAB PHASE I   PRE:  Pt ambulating in hallway  MODE:  Ambulation: 250 ft  117 peak HR  POST:  Rate/Rhythm: 113 ST  BP:  Sitting: 117/73    SaO2: 98 RA   Pt seen ambulating in hallway. Pt walked 24150ft standby assist with front wheel walker, and slow shuffling gait. Pt denies pain or SOB. Progressing slowly since surgery. Pt demonstrated 750 on IS. Encouraged to use 10x/h. Will continue to follow.  [phone] Reynold Boweneresa  Naleyah Ohlinger, RN BSN 01/16/2018 2:31 PM

## 2018-01-16 NOTE — Progress Notes (Signed)
Subjective:  Asked to see patient again for evaluation of tachycardia. She has had fairly persistent sinus tachycardia since surgery.  She has been placed on antibiotics for bronchitis and had some vomiting earlier today.  She has been on metoprolol 50 mg twice  But has persistent sinus tachycardia.  Objective:  Vital Signs in the last 24 hours: BP 118/76   Pulse (!) 134   Temp (!) 100.7 F (38.2 C) (Oral)   Resp (!) 28   Ht 5\' 3"  (1.6 m)   Wt 59.9 kg   SpO2 97%   BMI 23.39 kg/m   Physical Exam: Pleasant friendly black female in no acute distress Lungs:  Clear Cardiac:  Regular rhythm, normal S1 and S2, no S3 Extremities:  No edema present  Intake/Output from previous day: 08/18 0701 - 08/19 0700 In: -  Out: 1050 [Urine:1050]  Weight Filed Weights   01/14/18 0330 01/15/18 0500 01/16/18 0500  Weight: 62.6 kg 60.8 kg 59.9 kg    Lab Results: Basic Metabolic Panel: Recent Labs    01/14/18 0346 01/16/18 0524  NA 135 135  K 4.4 3.9  CL 96* 97*  CO2 29 27  GLUCOSE 111* 110*  BUN 11 10  CREATININE 0.54 0.67   CBC: Recent Labs    01/14/18 0346 01/16/18 0524  WBC 13.4* 9.9  NEUTROABS  --  7.0  HGB 9.1* 9.2*  HCT 27.7* 28.7*  MCV 82.4 82.5  PLT 151 241    Telemetry: Sinus tachycardia  Assessment/Plan:  1.  Persistent sinus tachycardia following surgery.   Her blood pressure is under fair control.  I would recommend that we get a repeat echo to be sure that she has not had a pericardial effusion that could be rest pulmonary embolus although she  In the meantime titrate beta blockers up.     Darden PalmerW. Spencer Tilley, Jr.  MD [facility]  01/16/2018, 1:16 PM

## 2018-01-16 NOTE — Telephone Encounter (Signed)
Dr. Clelia CroftShaw ,  Forwarding this message patient has already had surgery.

## 2018-01-17 ENCOUNTER — Inpatient Hospital Stay (HOSPITAL_COMMUNITY): Payer: BLUE CROSS/BLUE SHIELD

## 2018-01-17 DIAGNOSIS — I361 Nonrheumatic tricuspid (valve) insufficiency: Secondary | ICD-10-CM

## 2018-01-17 DIAGNOSIS — I340 Nonrheumatic mitral (valve) insufficiency: Secondary | ICD-10-CM

## 2018-01-17 LAB — ECHOCARDIOGRAM COMPLETE
HEIGHTINCHES: 63 in
WEIGHTICAEL: 2119.94 [oz_av]

## 2018-01-17 MED ORDER — LEVOFLOXACIN 500 MG PO TABS
500.0000 mg | ORAL_TABLET | Freq: Every day | ORAL | Status: DC
  Administered 2018-01-17 – 2018-01-19 (×3): 500 mg via ORAL
  Filled 2018-01-17 (×3): qty 1

## 2018-01-17 MED ORDER — WHITE PETROLATUM EX OINT
TOPICAL_OINTMENT | CUTANEOUS | Status: AC
  Administered 2018-01-17: 09:00:00
  Filled 2018-01-17: qty 28.35

## 2018-01-17 NOTE — Progress Notes (Signed)
Subjective:  Feeling better today with less dyspnea and fever.  Still tachycardic at rest but according to cardiac rehabilitation was not very tachycardic with exercise.  No ischemic chest pain.  O2 sats staying up.  Blood pressure is on the low side.  Objective:  Vital Signs in the last 24 hours: BP 98/64   Pulse (!) 101   Temp 100 F (37.8 C) (Oral)   Resp (!) 30   Ht 5\' 3"  (1.6 m)   Wt 60.1 kg   SpO2 96%   BMI 23.47 kg/m   Physical Exam: Pleasant friendly black female in no acute distress Lungs:  Clear Cardiac:  Regular rhythm, normal S1 and S2, no S3 Extremities:  No edema present  Intake/Output from previous day: 08/19 0701 - 08/20 0700 In: 1590.2 [P.O.:590; IV Piggyback:1000.2] Out: 1300 [Urine:1300]  Weight Filed Weights   01/15/18 0500 01/16/18 0500 01/17/18 0409  Weight: 60.8 kg 59.9 kg 60.1 kg    Lab Results: Basic Metabolic Panel: Recent Labs    01/16/18 0524  NA 135  K 3.9  CL 97*  CO2 27  GLUCOSE 110*  BUN 10  CREATININE 0.67   CBC: Recent Labs    01/16/18 0524  WBC 9.9  NEUTROABS 7.0  HGB 9.2*  HCT 28.7*  MCV 82.5  PLT 241    Telemetry: Sinus tachycardia  Assessment/Plan:  1.  Persistent sinus tachycardia following surgery. 2.  CAD with previous bypass grafting 3.  Very minimal pericardial effusion on echo   I reviewed the echo and she has a very small effusion no evidence of tamponade or anything else.  Her LVEF is good.  As long as her blood pressure allows I would continue to treat fever and increase beta blocker for the tachycardia.  Currently on 150 mg total per day.    Diana [name], Jr.  MD [facility]  01/17/2018, 6:08 PM

## 2018-01-17 NOTE — Progress Notes (Signed)
CARDIAC REHAB PHASE I   PRE:  Rate/Rhythm: 101 ST  BP:  Sitting: 108/72      SaO2: 98 RA  MODE:  Ambulation: 470 ft    111 peak HR  POST:  Rate/Rhythm: 104 ST  BP:  Sitting: 97/72    SaO2: 99 RA   Pt ambulated 42770ft in hallway independently with front wheel walker and very slow steady pace. Pt denies CP or SOB, does c/o some shoulder pain. Pt returned to room, call bell and phone within reach. Pt given hot packs. Will continue to follow.  [id number] Diana Boweneresa  Diana Denomme, RN BSN 01/17/2018 12:09 PM

## 2018-01-17 NOTE — Progress Notes (Signed)
  Echocardiogram 2D Echocardiogram has been performed.  Shinika Estelle G Landis Dowdy 01/17/2018, 2:11 PM

## 2018-01-17 NOTE — Progress Notes (Addendum)
      [address]       [place] [id number]             [phone]        7 Days Post-Op Procedure(s) (LRB): CORONARY ARTERY BYPASS GRAFTING (CABG) TIMES 4, USING LEFT INTERNAL MAMMARY ARTERY TO OM1  AND ENDOSCOPICALLY HARVESTED RIGHT SAPHENOUS VEIN TO DIAGONAL, AND SEQUENTIALLY TO OM2 AND DISTAL CIRCUMFLEX (N/A) TRANSESOPHAGEAL ECHOCARDIOGRAM (TEE) (N/A)  Subjective: Patient without complaints this am.  Objective: Vital signs in last 24 hours: Temp:  [98.3 F (36.8 C)-98.6 F (37 C)] 98.5 F (36.9 C) (08/20 0409) Pulse Rate:  [86-134] 111 (08/20 0409) Cardiac Rhythm: Sinus tachycardia (08/19 1901) Resp:  [19-27] 19 (08/20 0409) BP: (92-118)/(65-76) 111/70 (08/20 0409) SpO2:  [93 %-98 %] 95 % (08/20 0409) Weight:  [60.1 kg] 60.1 kg (08/20 0409)  Pre op weight 61.2 kg Current Weight  01/17/18 60.1 kg      Intake/Output from previous day: 08/19 0701 - 08/20 0700 In: 1590.2 [P.O.:590; IV Piggyback:1000.2] Out: 1300 [Urine:1300]   Physical Exam:  Cardiovascular: Slightly tachycardic Pulmonary: Slightly diminished right base Abdomen: Soft, non tender, bowel sounds present. Extremities: No lower extremity edema. Wounds: Clean and dry.  No erythema or signs of infection.  Lab Results: CBC: Recent Labs    01/16/18 0524  WBC 9.9  HGB 9.2*  HCT 28.7*  PLT 241   BMET:  Recent Labs    01/16/18 0524  NA 135  K 3.9  CL 97*  CO2 27  GLUCOSE 110*  BUN 10  CREATININE 0.67  CALCIUM 9.2    PT/INR:  Lab Results  Component Value Date   INR 1.48 01/10/2018   INR 0.99 01/06/2018   INR 0.99 10/30/2016   ABG:  INR: Will add last result for INR, ABG once components are confirmed Will add last 4 CBG results once components are confirmed  Assessment/Plan: 1. CV - Persistent have tachycarda with HR in the low 100's this am.  On Lopressor 50 mg bid and  low dose Lisinopril 2.5 mg daily. Await echo to rule out pericardial effusion. Appreciate  Dr. York Spanielilley's assistance 2.  Pulmonary - On room air. Encourage incentive spirometer. 3.  Acute blood loss anemia - H and H stable at 9.2 and 28.7. Continue ferrous sulfate and folic acid 4. ID-on Levaquin and Vancomycin for probable pulmonary etiology (?bronchitis vs early PNA). Remains afebrile and  WBC decreased to 9,900 yeterday. Blood culture no growth to date, urine with multiple organisms so likely contaminated. Will discuss with Dr. Tyrone SageGerhardt when to transition to oral antibiotic    Lelon HuhDonielle M ZimmermanPA-C 01/17/2018,7:13 AM [phone] Wounds intact, cultures negative , complete po antibiotics and d/c iv Poss home 1-2 days  I have seen and examined Patience MuscaPamela M [place] and agree with the above assessment  and plan.  Delight OvensEdward B Camilah Spillman MD Beeper [phone] Office [phone] 01/17/2018 9:05 AM

## 2018-01-18 LAB — GLUCOSE, CAPILLARY: Glucose-Capillary: 70 mg/dL (ref 70–99)

## 2018-01-18 MED ORDER — LACTULOSE 10 GM/15ML PO SOLN
20.0000 g | Freq: Once | ORAL | Status: AC
  Administered 2018-01-18: 20 g via ORAL
  Filled 2018-01-18: qty 30

## 2018-01-18 MED ORDER — ASPIRIN 325 MG PO TBEC: 325.0000 mg | DELAYED_RELEASE_TABLET | Freq: Every day | ORAL | 0 refills | Status: DC

## 2018-01-18 MED ORDER — METOPROLOL TARTRATE 50 MG PO TABS
75.0000 mg | ORAL_TABLET | Freq: Two times a day (BID) | ORAL | Status: DC
  Administered 2018-01-18 – 2018-01-19 (×2): 75 mg via ORAL
  Filled 2018-01-18 (×2): qty 1

## 2018-01-18 MED ORDER — FERROUS SULFATE 325 (65 FE) MG PO TABS: 325.0000 mg | ORAL_TABLET | Freq: Every day | ORAL | 3 refills | Status: DC

## 2018-01-18 MED ORDER — OXYCODONE HCL 5 MG PO TABS: ORAL_TABLET | ORAL | 0 refills | Status: DC

## 2018-01-18 MED ORDER — ATORVASTATIN CALCIUM 80 MG PO TABS: 80.0000 mg | ORAL_TABLET | Freq: Every day | ORAL | 1 refills | Status: DC

## 2018-01-18 MED ORDER — LEVOFLOXACIN 500 MG PO TABS: 500.0000 mg | ORAL_TABLET | Freq: Every day | ORAL | 0 refills | Status: DC

## 2018-01-18 MED ORDER — DICLOFENAC SODIUM 75 MG PO TBEC: 75.0000 mg | DELAYED_RELEASE_TABLET | Freq: Two times a day (BID) | ORAL | 0 refills | Status: DC

## 2018-01-18 MED ORDER — POLYETHYLENE GLYCOL 3350 17 G PO PACK
17.0000 g | PACK | Freq: Every day | ORAL | Status: DC
  Administered 2018-01-18 – 2018-01-19 (×2): 17 g via ORAL
  Filled 2018-01-18 (×2): qty 1

## 2018-01-18 NOTE — Progress Notes (Signed)
CARDIAC REHAB PHASE I   PRE:  Rate/Rhythm: 101 ST  BP:  Sitting: 98/60      SaO2: 97 RA  MODE:  Ambulation: 470 ft      108 peak HR  POST:  Rate/Rhythm: 102 ST  BP:  Sitting: 96/71    SaO2: 98 RA   Pt ambulated 43170ft in hallway independently with front wheel walker and very slow steady gait. Pt denies dizziness or SOB. Pt demonstrated 575 on IS. Pt states yesterday she had been using it once every other hour. Pt reeducated to use IS or Flutter valve 10x EVERY hour. Pt verbalizes understanding. Pt also educate on need for ambulating two more times today. Pt has not walked more than twice since surgery. RN and NT made aware to reinforce IS use and ambulation. Will continue to follow.  [id number] Reynold Boweneresa  Lenzy Kerschner, RN BSN 01/18/2018 9:57 AM

## 2018-01-18 NOTE — Progress Notes (Signed)
Subjective:  Not really getting gout of the bed much.  Is walking with walker but not independently. Still tachycardic but not greatly increased with exercise. Temp trending down. C/o edema of right leg. Metoprolol held by nurse last night.   Objective:  Vital Signs in the last 24 hours: BP 105/70 (BP Location: Right Arm)   Pulse (!) 58   Temp 98.6 F (37 C) (Oral)   Resp (!) 26   Ht 5\' 3"  (1.6 m)   Wt 60 kg   SpO2 95%   BMI 23.42 kg/m   Physical Exam: Pleasant friendly black female in no acute distress Lungs:  Clear Cardiac:  Regular rhythm, normal S1 and S2, no S3 Extremities:  1-2+edema of right leg Intake/Output from previous day: 08/20 0701 - 08/21 0700 In: 460 [P.O.:460] Out: 800 [Urine:800]  Weight Filed Weights   01/16/18 0500 01/17/18 0409 01/18/18 0455  Weight: 59.9 kg 60.1 kg 60 kg    Lab Results: Basic Metabolic Panel: Recent Labs    01/16/18 0524  NA 135  K 3.9  CL 97*  CO2 27  GLUCOSE 110*  BUN 10  CREATININE 0.67   CBC: Recent Labs    01/16/18 0524  WBC 9.9  NEUTROABS 7.0  HGB 9.2*  HCT 28.7*  MCV 82.5  PLT 241    Telemetry: Sinus tachycardia  Assessment/Plan:  1.  Persistent sinus tachycardia following surgery. 2.  CAD with previous bypass grafting 3.  Very minimal pericardial effusion on echo  I will change metoprolol to BID pending potential for discharge.  Still unclear why she is tachycardic.      Darden PalmerW. Spencer Marrian Bells, Jr.  MD [facility]ACC Cardiology  01/18/2018, 1:33 PM

## 2018-01-18 NOTE — Progress Notes (Signed)
Pt ambulated 41370ft 3x in hallway with standby assist. Progressing well. Will continue to monitor.  Versie StarksHanna  Calhoun Reichardt, RN

## 2018-01-18 NOTE — Progress Notes (Signed)
Held Lopressor for S. B.P. Less than 105. Per M.D. Orders.

## 2018-01-18 NOTE — Progress Notes (Addendum)
      [address]       [place] [id number]             [phone]        8 Days Post-Op Procedure(s) (LRB): CORONARY ARTERY BYPASS GRAFTING (CABG) TIMES 4, USING LEFT INTERNAL MAMMARY ARTERY TO OM1  AND ENDOSCOPICALLY HARVESTED RIGHT SAPHENOUS VEIN TO DIAGONAL, AND SEQUENTIALLY TO OM2 AND DISTAL CIRCUMFLEX (N/A) TRANSESOPHAGEAL ECHOCARDIOGRAM (TEE) (N/A)  Subjective: Patient has complaints of constipation this am  Objective: Vital signs in last 24 hours: Temp:  [98.3 F (36.8 C)-100 F (37.8 C)] 98.3 F (36.8 C) (08/21 0455) Pulse Rate:  [101-116] 102 (08/21 0455) Cardiac Rhythm: Sinus tachycardia (08/20 1900) Resp:  [19-30] 19 (08/21 0455) BP: (83-108)/(58-67) 108/67 (08/21 0455) SpO2:  [94 %-99 %] 94 % (08/21 0455) Weight:  [60 kg] 60 kg (08/21 0455)  Pre op weight 61.2 kg Current Weight  01/18/18 60 kg      Intake/Output from previous day: 08/20 0701 - 08/21 0700 In: 460 [P.O.:460] Out: 800 [Urine:800]   Physical Exam:  Cardiovascular: Tachycardic Pulmonary: Slightly diminished right base Abdomen: Soft, distended, bowel sounds present. Extremities: No lower extremity edema. Wounds: Clean and dry.  No erythema or signs of infection.  Lab Results: CBC: Recent Labs    01/16/18 0524  WBC 9.9  HGB 9.2*  HCT 28.7*  PLT 241   BMET:  Recent Labs    01/16/18 0524  NA 135  K 3.9  CL 97*  CO2 27  GLUCOSE 110*  BUN 10  CREATININE 0.67  CALCIUM 9.2    PT/INR:  Lab Results  Component Value Date   INR 1.48 01/10/2018   INR 0.99 01/06/2018   INR 0.99 10/30/2016   ABG:  INR: Will add last result for INR, ABG once components are confirmed Will add last 4 CBG results once components are confirmed  Assessment/Plan: 1. CV - Tachycardia but HR not as high as previously with ambulation.  On Lopressor 50 mg tid and  low dose Lisinopril 2.5 mg daily. BP borderline so will stop Lisinopril as receiving Lopressor more important at this  time. Echo done yesterday showed LVEF 60-65%, no significant pericardial effusion. Appreciate Dr. York Spanielilley's assistance 2.  Pulmonary - On room air. Encourage incentive spirometer. 3.  Acute blood loss anemia - H and H stable at 9.2 and 28.7. Continue ferrous sulfate and folic acid 4. ID-on oral Levaquin for probable pulmonary etiology (?bronchitis vs early PNA). Remains afebrile and last WBC decreased to 9,900. Blood culture no growth to date, urine with multiple organisms so likely contaminated.  5. LOC constipation 6. As discussed with Dr. Tyrone SageGerhardt, will d/c in am  Lelon HuhDonielle M [facility] CenterZimmermanPA-C 01/18/2018,7:06 AM [phone]  Plan d/c tomorrow tsh .66 doubt hyperthyriod but will chest t3 t4  I have seen and examined Diana MuscaPamela M Tamez and agree with the above assessment  and plan.  Delight OvensEdward B Haillee Johann MD [facility] [phone] Office [phone] 01/18/2018 7:24 PM

## 2018-01-19 LAB — CULTURE, BLOOD (ROUTINE X 2)
CULTURE: NO GROWTH
Culture: NO GROWTH
SPECIAL REQUESTS: ADEQUATE

## 2018-01-19 LAB — T4, FREE: Free T4: 1.23 ng/dL (ref 0.82–1.77)

## 2018-01-19 MED ORDER — ATORVASTATIN CALCIUM 80 MG PO TABS: 80.0000 mg | ORAL_TABLET | Freq: Every day | ORAL | 1 refills | Status: DC

## 2018-01-19 MED ORDER — LEVOFLOXACIN 500 MG PO TABS: 500.0000 mg | ORAL_TABLET | Freq: Every day | ORAL | 0 refills | Status: DC

## 2018-01-19 MED ORDER — METOPROLOL TARTRATE 75 MG PO TABS: 75.0000 mg | ORAL_TABLET | Freq: Two times a day (BID) | ORAL | 1 refills | Status: DC

## 2018-01-19 MED ORDER — METOPROLOL TARTRATE 75 MG PO TABS: 75.0000 mg | ORAL_TABLET | Freq: Two times a day (BID) | ORAL | 1 refills | Status: AC

## 2018-01-19 NOTE — Progress Notes (Signed)
CARDIAC REHAB PHASE I   PRE:  Rate/Rhythm: 104 ST  BP:  Sitting: 91/63      SaO2: 97 RA  MODE:  Ambulation: 470 ft 114 peak HR  POST:  Rate/Rhythm: 107 ST  BP:  Sitting: 120/77    SaO2: 96 RA   Pt ambulated 44570ft in hallway. Pt did half with rolling walker, and half without anything. Pt with slow gait, but slowly improving. D/c education completed with pt. Pt educated to shower daily and monitor incisions. Pt strongly encouraged to continue IS and Flutter use. Stressed importance of daily walks, exercise guidelines given. Reinforced sternal precautions. Pt given heart healthy diet. Reviewed restrictions. Will refer to CRP II [place].  [id number] Diana Boweneresa  Diana Hensler, RN BSN 01/19/2018 10:30 AM

## 2018-01-19 NOTE — Telephone Encounter (Signed)
NA - pt would have to call Dr. Donnie Ahoilley. Pt is currently in the hosp under cardiology care.

## 2018-01-19 NOTE — Care Management Note (Signed)
Case Management Note Donn PieriniKristi Cleveland Yarbro RN,BSN Unit 2H 1-22 - RN Care Coordinator (Case Management) [phone]  Patient Details  Name: Diana [name] MRN: [id number] Date of Birth: 04/10/1956  Subjective/Objective:   Pt admitted with CAD s/p CABG on 8/13                 Action/Plan: PTA Pt lived at home, was independent with mobility and ADLs, anticipate return home. Donzetta StarchPCP-Eva Shaw, CM to follow for transition of care needs  Expected Discharge Date:   01/19/18              Expected Discharge Plan:  Home/Self Care  In-House Referral:     Discharge planning Services  CM Consult  Post Acute Care Choice:  Durable Medical Equipment Choice offered to:  Patient  DME Arranged:  3-N-1, Walker rolling DME Agency:  [facility]  HH Arranged:  NA HH Agency:  NA  Status of Service:  Completed, signed off  If discussed at Long Length of Stay Meetings, dates discussed:    Discharge Disposition: home/self care   Additional Comments:  01/19/18- 1400- Donn PieriniKristi Ethelyn Cerniglia RN, CM- pt for transition home today- orders placed for RW an 3n1- notified James with [employer] for DME needs- equipment to be delivered to room prior to discharge.   Darrold SpanWebster, Vickii Volland Hall, RN 01/19/2018, 2:06 PM

## 2018-01-19 NOTE — Progress Notes (Addendum)
      [address]       [place] [id number]             [phone]        9 Days Post-Op Procedure(s) (LRB): CORONARY ARTERY BYPASS GRAFTING (CABG) TIMES 4, USING LEFT INTERNAL MAMMARY ARTERY TO OM1  AND ENDOSCOPICALLY HARVESTED RIGHT SAPHENOUS VEIN TO DIAGONAL, AND SEQUENTIALLY TO OM2 AND DISTAL CIRCUMFLEX (N/A) TRANSESOPHAGEAL ECHOCARDIOGRAM (TEE) (N/A)  Subjective: Patient had small bowel movement yesterday.  Objective: Vital signs in last 24 hours: Temp:  [98 F (36.7 C)-100.5 F (38.1 C)] 98.4 F (36.9 C) (08/22 0540) Pulse Rate:  [58-114] 85 (08/22 0540) Cardiac Rhythm: Sinus tachycardia (08/21 1910) Resp:  [22-28] 28 (08/22 0540) BP: (96-118)/(60-82) 110/77 (08/22 0540) SpO2:  [95 %-100 %] 96 % (08/22 0540) Weight:  [59.3 kg] 59.3 kg (08/22 0540)  Pre op weight 61.2 kg Current Weight  01/19/18 59.3 kg      Intake/Output from previous day: 08/21 0701 - 08/22 0700 In: 120 [P.O.:120] Out: -    Physical Exam:  Cardiovascular: Tachycardic Pulmonary: Mostly clear Abdomen: Soft, non tender, bowel sounds present. Extremities: No lower extremity edema. Wounds: Clean and dry.  No erythema or signs of infection.  Lab Results: CBC: No results for input(s): WBC, HGB, HCT, PLT in the last 72 hours. BMET:  No results for input(s): NA, K, CL, CO2, GLUCOSE, BUN, CREATININE, CALCIUM in the last 72 hours.  PT/INR:  Lab Results  Component Value Date   INR 1.48 01/10/2018   INR 0.99 01/06/2018   INR 0.99 10/30/2016   ABG:  INR: Will add last result for INR, ABG once components are confirmed Will add last 4 CBG results once components are confirmed  Assessment/Plan: 1. CV - Tachycardia but HR not as high as previously with ambulation.  On Lopressor 75 mg bid.  Appreciate Dr. York Spanielilley's assistance 2.  Pulmonary - On room air. Encourage incentive spirometer. 3.  Acute blood loss anemia - H and H stable at 9.2 and 28.7. Continue ferrous sulfate and  folic acid 4. ID-on oral Levaquin for probable pulmonary etiology (?bronchitis vs early PNA). Remains afebrile and last WBC decreased to 9,900. Blood culture no growth to date, urine with multiple organisms so likely contaminated.  5. No prior history of hyperthyroidism-T3 results pending;Free T4 "WNL" 6. Will discharge later today  Lelon HuhDonielle M [facility] HospitalZimmermanPA-C 01/19/2018,7:12 AM [phone]  Plan home today  I have seen and examined Diana MuscaPamela M Heesch and agree with the above assessment  and plan.  Delight OvensEdward B Quaniyah Bugh MD [facility]per [phone] Office [phone] 01/19/2018 9:12 AM

## 2018-01-19 NOTE — Progress Notes (Signed)
Subjective:  Temp down.  No SOB or chest pain.  Pulse currently 94 sinus.  Tachycardia better  Objective:  Vital Signs in the last 24 hours: BP 103/69   Pulse 62   Temp 98.4 F (36.9 C) (Oral)   Resp (!) 28   Ht 5\' 3"  (1.6 m)   Wt 59.3 kg   SpO2 100%   BMI 23.16 kg/m   Physical Exam: Pleasant friendly black female in no acute distress Lungs:  Clear Cardiac:  Regular rhythm, normal S1 and S2, no S3 Extremities:  1-2+edema of right leg  Intake/Output from previous day: 08/21 0701 - 08/22 0700 In: 120 [P.O.:120] Out: -   Weight Filed Weights   01/17/18 0409 01/18/18 0455 01/19/18 0540  Weight: 60.1 kg 60 kg 59.3 kg   Telemetry: Sinus tachycardia  Assessment/Plan:  1.  Tachycardia improved with time and increase beta blockers 2.  CAD with previous bypass grafting 3.  Very minimal pericardial effusion on echo  Discharge today. I need to see in 2 weeks.      Darden PalmerW. Spencer Tilley, Jr.  MD [facility]ACC Cardiology  01/19/2018, 12:49 PM

## 2018-01-20 ENCOUNTER — Telehealth (HOSPITAL_COMMUNITY): Payer: Self-pay

## 2018-01-20 ENCOUNTER — Other Ambulatory Visit: Payer: Self-pay | Admitting: Family Medicine

## 2018-01-20 LAB — T3: T3, Total: 88 ng/dL (ref 71–180)

## 2018-01-20 NOTE — Telephone Encounter (Signed)
Made initial call to patient in regards to Cardiac Rehab - Patient asked if the program was free. I explained to patient that as of now she will be responsible for 20% until she reaches her max out of pocket, patient verbalized understanding. Informed patient she will need to complete follow up appt with Dr.Tilley before we proceed with scheduling, she verbalized understanding. Will contact patient for scheduling once follow up appt has been completed.

## 2018-01-20 NOTE — Telephone Encounter (Signed)
Patients insurance is active and benefits verified through [facility] - No co-pay, deductible amount of $500.00/$500.00 has been met, out of pocket amount of $3,500/$2.801.64 has been met, 20% co-insurance, and no pre-authorization is required. Passport/reference [id number]  Will contact patient in regards to Cardiac Rehab to see if she is interested in the Cardiac Rehab program. If interested patient will need to complete follow up appt. Once complete patient will be contact for scheduling.

## 2018-01-20 NOTE — Telephone Encounter (Signed)
Copied from CRM [id number]. Topic: Quick Communication - Rx Refill/Question >> Jan 20, 2018  2:38 PM Gloriann LoanPayne, Daisy Mcneel L wrote: Medication: ferrous sulfate 325 (65 FE) MG tablet  Vitamin D, Ergocalciferol, (DRISDOL) 50000 units CAPS capsule pt need this medicine as soon as possible she states that she just got out of hospital  Has the patient contacted their pharmacy? Yes.   (Agent: If no, request that the patient contact the pharmacy for the refill.) (Agent: If yes, when and what did the pharmacy advise?) told her that they will reach out to provider  Preferred Pharmacy (with phone number or street name): [id number] - [place], [place] - [address] AT [facility] [address] [phone] ([phone] (Fax)    Agent: Please be advised that RX refills may take up to 3 business days. We ask that you follow-up with your pharmacy.

## 2018-01-20 NOTE — Telephone Encounter (Signed)
Call to pharmacy- they are filling the Rx for Vitamin D. The request for ferrous sulfate 325 mg is outside provider and they do not show that- I am sending that for review and consideration for filling. It could be the way it was sent- no print    LOV: 01/02/18  PCP: Clelia CroftShaw  Pharmacy: verified

## 2018-01-20 NOTE — Telephone Encounter (Signed)
Called Dr.Tilley's office in regards to patients appt - lm on vm for [facility].

## 2018-01-24 NOTE — Telephone Encounter (Signed)
Spoke with pt via phone and she advises she spoke with physician asst. zimmerman's office and it was supposed to be called in on last Wednesday(iron ) but wasn't at the pharmacy.  Per pt spoke with zimmerman's office and her nurse is taking care of this matter. Dgaddy, CMA

## 2018-01-30 NOTE — Telephone Encounter (Signed)
Py was admitted 8/12-8/22 - has had surg

## 2018-01-31 ENCOUNTER — Other Ambulatory Visit: Payer: Self-pay | Admitting: Family Medicine

## 2018-01-31 ENCOUNTER — Telehealth: Payer: Self-pay | Admitting: Family Medicine

## 2018-01-31 DIAGNOSIS — M542 Cervicalgia: Secondary | ICD-10-CM

## 2018-01-31 NOTE — Telephone Encounter (Signed)
Patient dropped off [facility] Form for Dr. Clelia Croft to fill out. Placed in providers box at 104.

## 2018-02-01 NOTE — Telephone Encounter (Signed)
Pt calling to follow up on her Rx refills.  ALPRAZolam (XANAX) 0.5 MG tablet cyclobenzaprine (FLEXERIL) 10 MG tablet  Pt states Dr Clelia Croft gave her this in order for her to rest after her open heart surgery.  [id number] - [place], [place] - [address] AT [facility] OF [phone] (Phone) [phone] (Fax)    Also, pt has paperwork for Dr Clelia Croft to complete, phone encounter also notes this info.

## 2018-02-03 NOTE — Telephone Encounter (Signed)
Pt following up on request. She said she spoke with someone who told her another doctor would order medication since Dr. Clelia Croft is out this month. Pt is out of medication. Please advise.

## 2018-02-03 NOTE — Telephone Encounter (Signed)
Please advise. Dgaddy, CMA 

## 2018-02-06 ENCOUNTER — Telehealth: Payer: Self-pay | Admitting: Family Medicine

## 2018-02-06 NOTE — Telephone Encounter (Signed)
Refill request for Xanax and flexeril / LOV with Dr. Clelia Croft on 01/02/2018 / Xanax last written 01/03/2018 #20 / Flexeril last written 01/03/2018 #30 /

## 2018-02-06 NOTE — Telephone Encounter (Addendum)
Please review for refills: cyclobenzaprine  10 mg tab,for 30 tabs and alprazolam 0.5 mg tab, for  20 tabs   LOV  01/02/18 PCP Dr. Clelia Croft Both filled at this visit.   Please review for controlled medication.

## 2018-02-06 NOTE — Telephone Encounter (Signed)
Copied from CRM [id number]. Topic: Quick Communication - Rx Refill/Question >> Feb 06, 2018 10:25 AM Aretta Nip wrote: Medication: cyclobenzaprine (FLEXERIL) 10 MG  / ALPRAZolam (XANAX) 0.5 MG tablet  (2) Pt has had open-heart surgery/ upset that Dr Clelia Croft is not available to call her Pt has contacted pharmacy/ called last week and did not get refilled/ no reasons or call back/ upset waited a week calling pharmacy Preferred Pharmacy (with phone number or street name): [facility] [place] [phone]  Agent: Please be advised that RX refills may take up to 3 business days. We ask that you follow-up with your pharmacy.

## 2018-02-07 ENCOUNTER — Telehealth: Payer: Self-pay

## 2018-02-07 ENCOUNTER — Telehealth (HOSPITAL_COMMUNITY): Payer: Self-pay

## 2018-02-07 NOTE — Telephone Encounter (Signed)
Pt called the Patient Relations Specialist to give a grievance about her medication refills. Pt states she has called the practice and the pharmacy about refills but know one has called her back. She states she needs refills because she is out of medication and Dr. Clelia Croft is out of the office for the month of September. I advised pt that I would send over a message to Dr. Leretha Pol and see if she will refill her medication until she can see Dr. Clelia Croft next month. Advised I will call her back when refills has been sent, she verbalized understanding.

## 2018-02-07 NOTE — Telephone Encounter (Signed)
Called Dr. York Spaniel office and spoke with Olegario Messier, who stated pt had follow up appt on 02/02/18. She will fax over office notes today.  Returned patient called - LM on VM

## 2018-02-07 NOTE — Telephone Encounter (Signed)
pmp reviewed PCP [facility] meds refilled

## 2018-02-10 ENCOUNTER — Telehealth (HOSPITAL_COMMUNITY): Payer: Self-pay

## 2018-02-10 NOTE — Telephone Encounter (Signed)
Called and spoke with patient in regards to Cardiac Rehab - Scheduled orientation on 04/06/18 at 130pm. Patient will attend the 2:45pm exc class. Mailed packet.

## 2018-02-12 ENCOUNTER — Other Ambulatory Visit: Payer: Self-pay | Admitting: Family Medicine

## 2018-02-14 NOTE — Telephone Encounter (Signed)
Please see note below and refill if possible  

## 2018-02-15 ENCOUNTER — Ambulatory Visit: Payer: BLUE CROSS/BLUE SHIELD | Admitting: Podiatry

## 2018-02-15 ENCOUNTER — Encounter: Payer: Self-pay | Admitting: Podiatry

## 2018-02-15 DIAGNOSIS — M779 Enthesopathy, unspecified: Secondary | ICD-10-CM | POA: Diagnosis not present

## 2018-02-15 DIAGNOSIS — M7751 Other enthesopathy of right foot: Secondary | ICD-10-CM

## 2018-02-15 DIAGNOSIS — M7752 Other enthesopathy of left foot: Secondary | ICD-10-CM

## 2018-02-17 ENCOUNTER — Other Ambulatory Visit: Payer: Self-pay | Admitting: Cardiothoracic Surgery

## 2018-02-17 DIAGNOSIS — Z951 Presence of aortocoronary bypass graft: Secondary | ICD-10-CM

## 2018-02-18 NOTE — Progress Notes (Signed)
   HPI: 62 year old female presenting today for follow up evaluation of capsulitis of the 1st MPJ of the left foot and 2nd MPJ of the right foot. She reports some swelling and numbness of the right foot. Patient states she has not been on her feet much lately because she has been in the hospital for CABG surgery. She denies any modifying factors. When she has been on her feet, she has been wearing the custom molded orthotics. Patient is here for further evaluation and treatment.   Past Medical History:  Diagnosis Date  . Allergy   . Arthritis    feet  . Bronchitis 10/30/2016  . Carpal tunnel syndrome   . Coronary artery disease   . Non-ST elevation (NSTEMI) myocardial infarction (HCC) 10/30/2016  . NSTEMI (non-ST elevated myocardial infarction) (HCC) 10/30/2016     Physical Exam: General: The patient is alert and oriented x3 in no acute distress.  Dermatology: Skin is warm, dry and supple bilateral lower extremities. Negative for open lesions or macerations.  Vascular: Palpable pedal pulses bilaterally. No edema or erythema noted. Capillary refill within normal limits.  Neurological: Epicritic and protective threshold grossly intact bilaterally.   Musculoskeletal Exam: Pain with palpation to the 1st MPJ of the left foot and 2nd MPJ of the right foot. Range of motion within normal limits to all pedal and ankle joints bilateral. Muscle strength 5/5 in all groups bilateral.   Assessment: 1. 2nd MPJ capsulitis right 2. 1st MPJ capsulitis left    Plan of Care:  1. Patient evaluated.  2. Continue using custom molded orthotics and good shoe gear.  3. Patient is recovering from bypass surgery.  4. Return to clinic as needed.       Felecia ShellingBrent M. Valia Wingard, DPM [facility]  Dr. Felecia ShellingBrent M. Johnny Gorter, DPM    [address]                                        [place], [place] [address]                Office [phone]  Fax [phone]

## 2018-02-20 ENCOUNTER — Ambulatory Visit (INDEPENDENT_AMBULATORY_CARE_PROVIDER_SITE_OTHER): Payer: Self-pay | Admitting: Surgical

## 2018-02-20 ENCOUNTER — Ambulatory Visit
Admission: RE | Admit: 2018-02-20 | Discharge: 2018-02-20 | Disposition: A | Discharge status: Home / Self Care | Payer: BLUE CROSS/BLUE SHIELD | Source: Ambulatory Visit | Attending: Cardiothoracic Surgery | Admitting: Cardiothoracic Surgery

## 2018-02-20 VITALS — BP 130/76 | HR 86 | Resp 20 | Ht 65.5 in | Wt 135.0 lb

## 2018-02-20 DIAGNOSIS — Z951 Presence of aortocoronary bypass graft: Secondary | ICD-10-CM

## 2018-02-20 DIAGNOSIS — I25119 Atherosclerotic heart disease of native coronary artery with unspecified angina pectoris: Secondary | ICD-10-CM

## 2018-02-20 IMAGING — DX DG CHEST 2V
2 series · 2 of 2 positions shown · non-contrast
Comparison: [DATE]

CLINICAL DATA: Coronary artery disease post CABG in [DATE]

EXAM:
CHEST - 2 VIEW

[dg chest 2 view (1 of 2)]
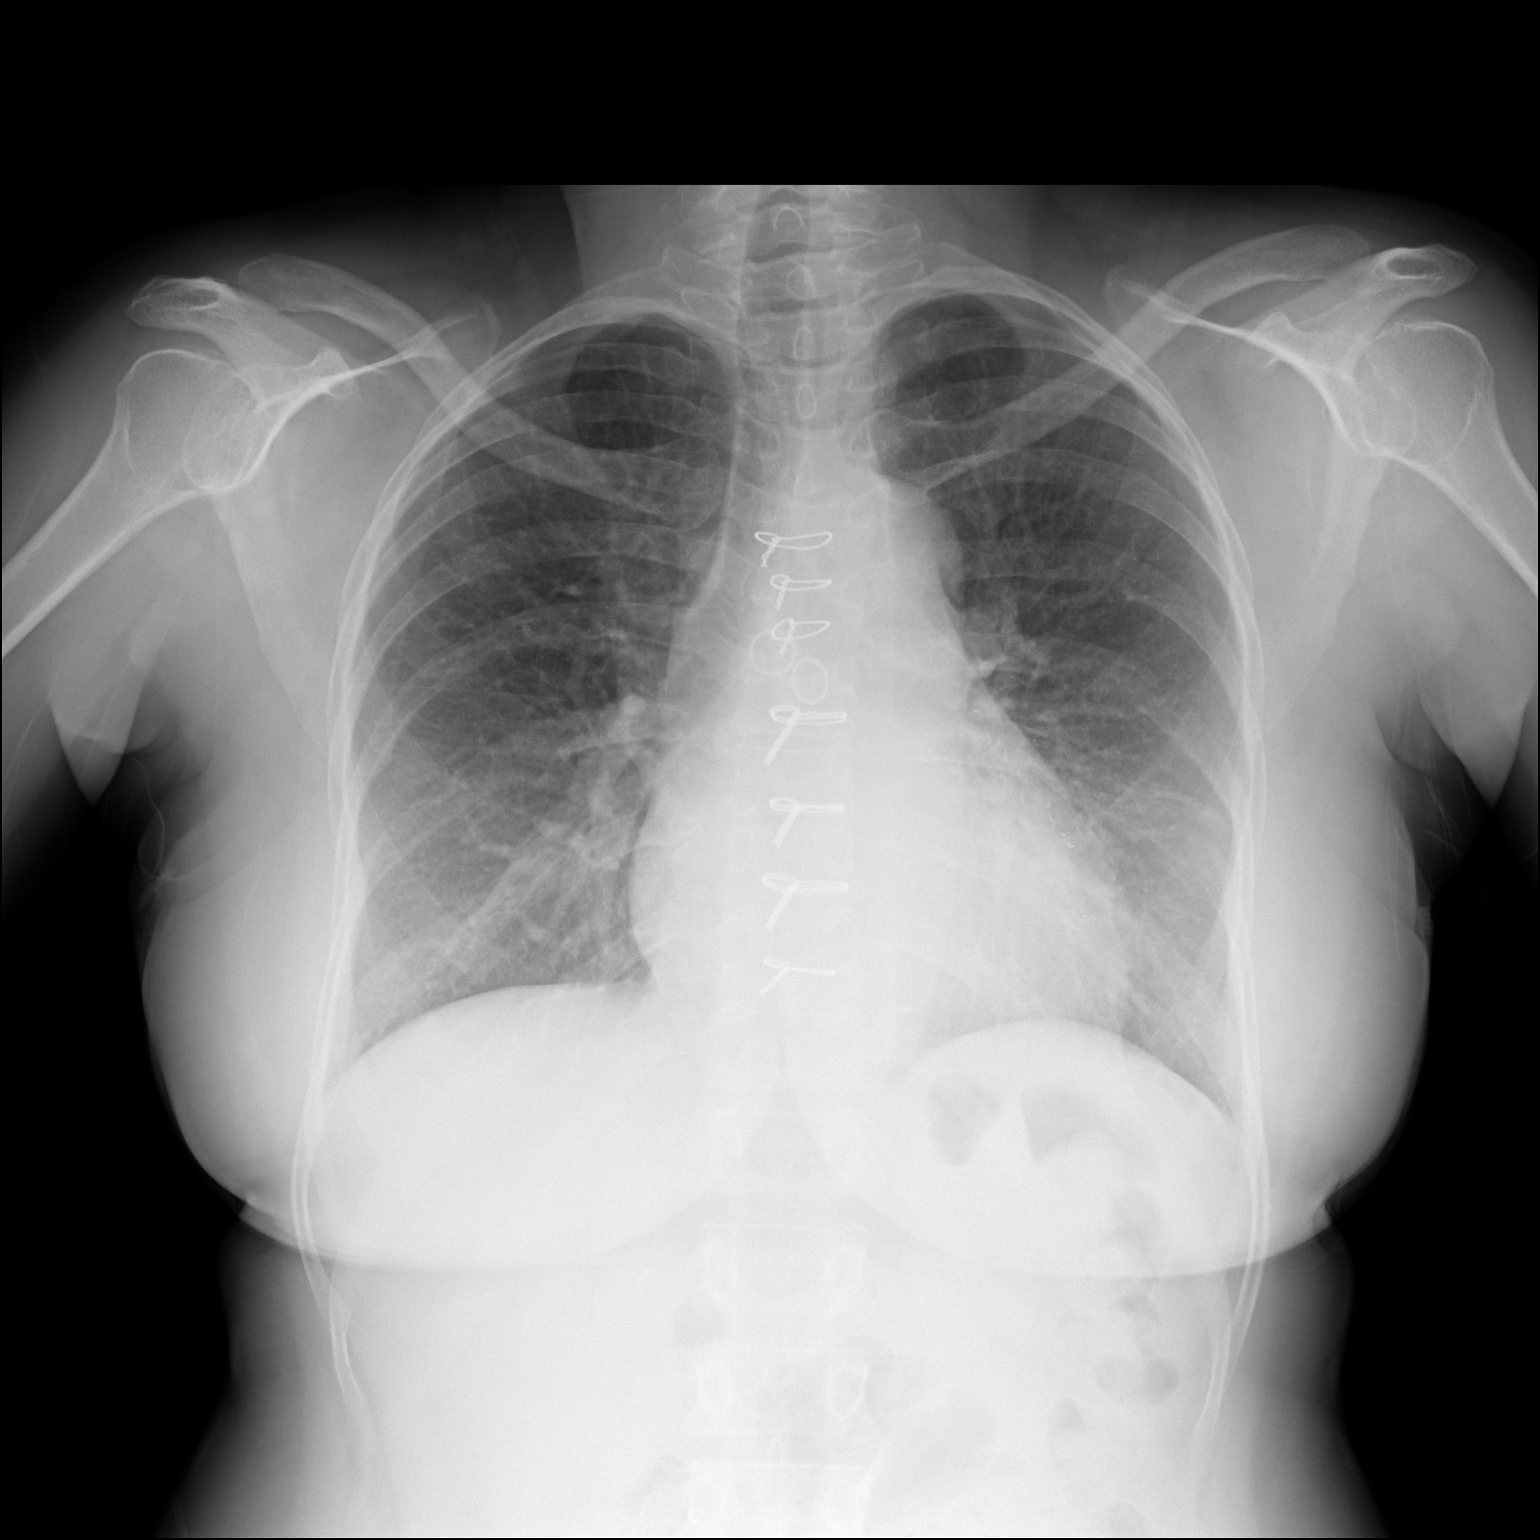

[dg chest 2 view (2 of 2)]
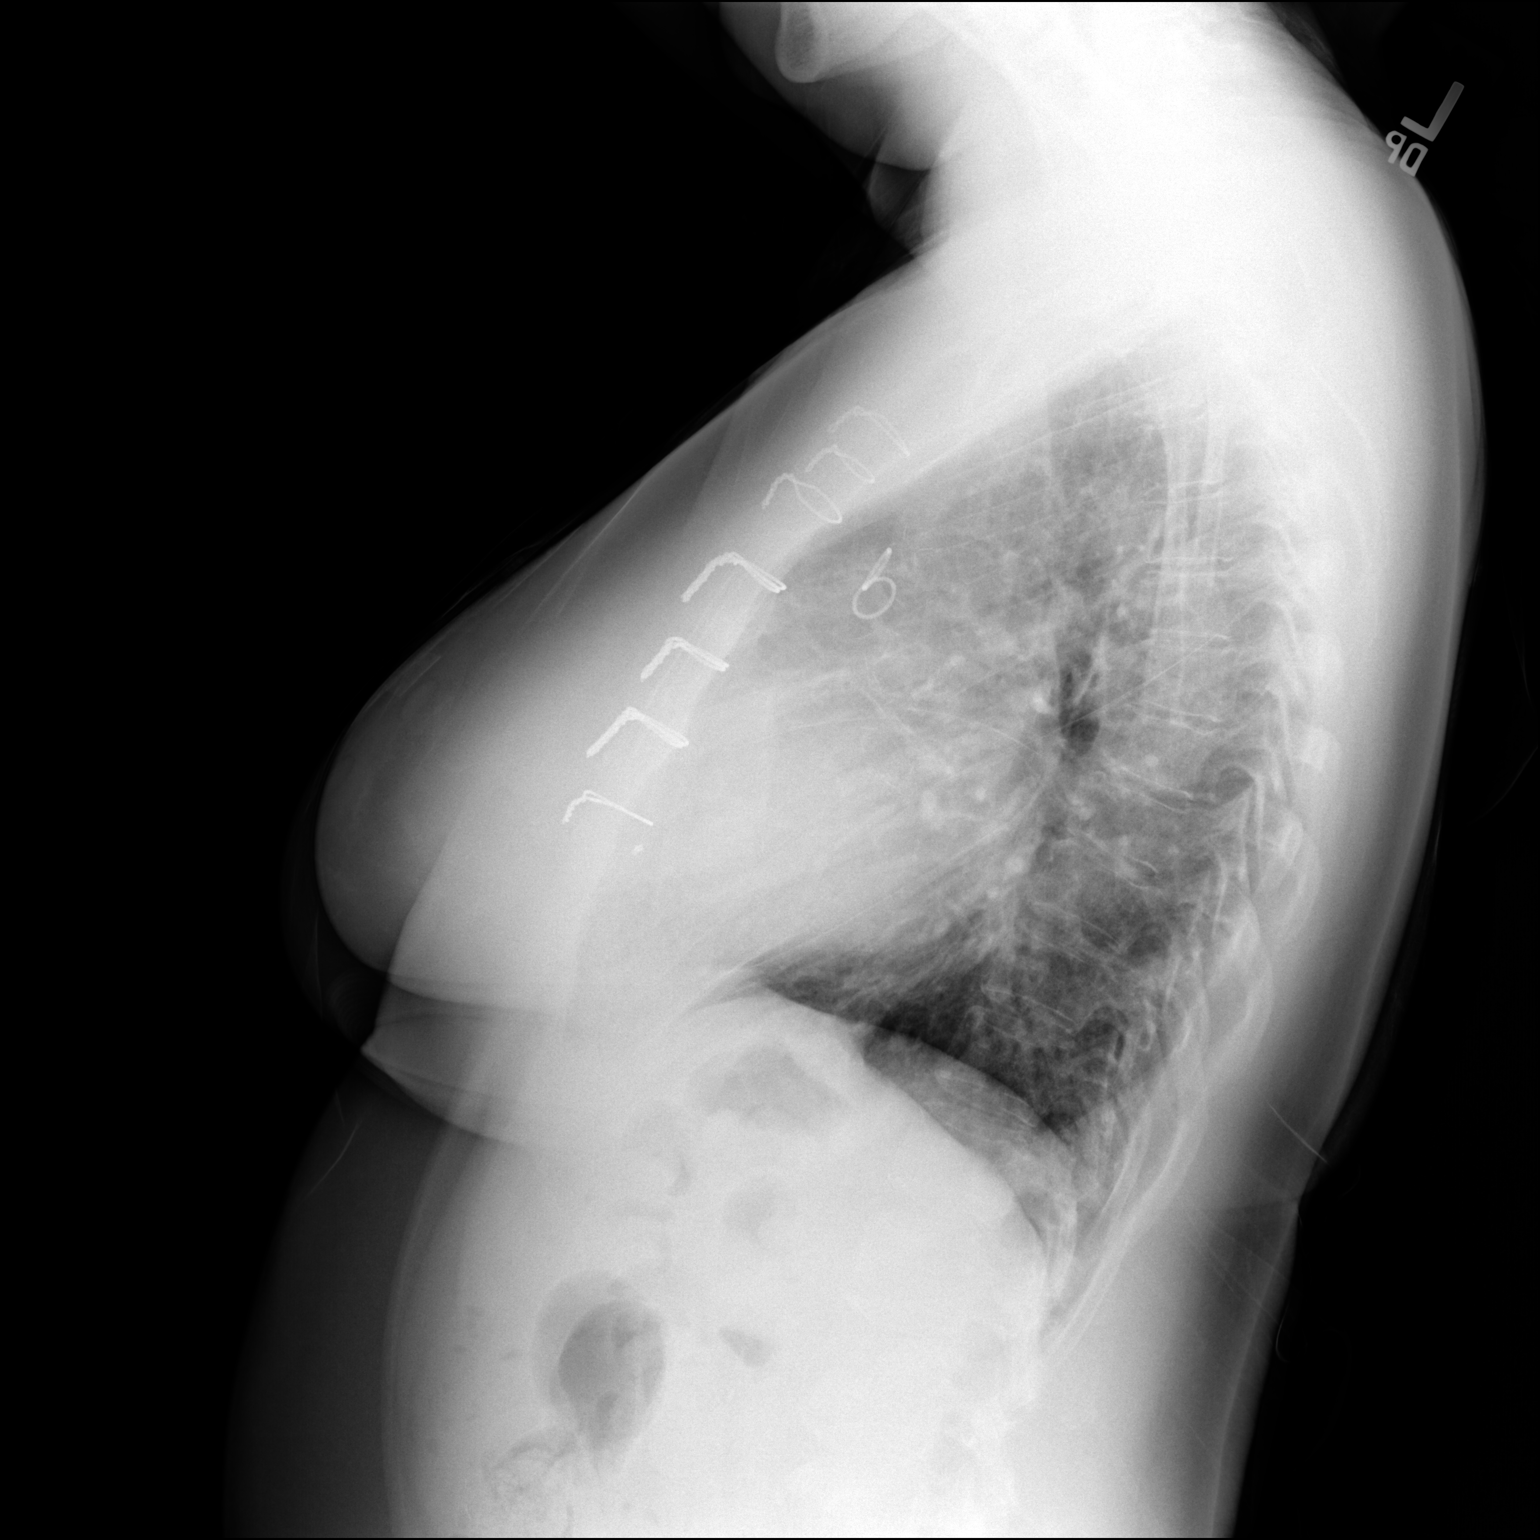

[2 of 2 positions shown; findings below may reference images not displayed]

FINDINGS: Minimal enlargement of cardiac silhouette post CABG.

Mediastinal contours and pulmonary vascularity normal.

Lungs clear.

No pulmonary infiltrate, pleural effusion or pneumothorax.

Bones unremarkable.
IMPRESSION: Minimal enlargement of cardiac silhouette post CABG.

No acute abnormalities.

## 2018-02-20 NOTE — Progress Notes (Signed)
[address]       [place] [address]             [phone]      BRANDILEE PIES [facility] Medical Record #[id number] Date of Birth: [birth year]  Referring: Othella Boyer, MD Primary Care: Sherren Mocha, MD Primary Cardiologist: No primary care provider on file.   Chief Complaint:   POST OP FOLLOW UP /13/2019  3:07 PM  PATIENT:  Diana [name]  62 y.o. female  PRE-OPERATIVE DIAGNOSIS:  CAD  POST-OPERATIVE DIAGNOSIS:  CAD  PROCEDURE:  TRANSESOPHAGEAL ECHOCARDIOGRAM (TEE),  MEDIAN STERNOTOMY for CORONARY ARTERY BYPASS GRAFTING (CABG) TIMES 4  (LEFT INTERNAL MAMMARY ARTERY TO OM1,  RIGHT GREATER SAPHENOUS VEIN TO DIAGONAL, AND SEQUENTIALLY TO OM2 AND DISTAL CIRC) using RIGHT GREATER SAPHENOUS VEIN  ENDOSCOPICALLY HARVESTED  SURGEON:  Surgeon(s) and Role:    Delight Ovens, MD - Primary  PHYSICIAN ASSISTANT: Doree Fudge PA-C  ASSISTANTS: Benay Spice RNFA  ANESTHESIA:   general  History of Present Illness:    Patient is a 62 year old female status post the above described procedure seen in the date and routine postsurgical follow-up.  Overall patient is continuing to do quite well.  He is only requiring Tylenol at this point for pain relief.  He does take some chronic medications for pain symptoms including Voltaren and Flexeril.  She also uses Fioricet for headaches.  She denies fevers, chills or other constitutional symptoms.  She has not had any palpitations.  She denies shortness of breath.  She has some minor lower extremity edema.  He does complain of some right lower extremity numbness.  She has had no difficulties with her incisions.  Overall she is pleased with her progress.  She has seen cardiology and is scheduled to start cardiac rehab soon.      Past Medical History:  Diagnosis Date  . Allergy   . Arthritis    feet  . Bronchitis 10/30/2016  . Carpal tunnel syndrome   . Coronary artery disease   . Non-ST elevation  (NSTEMI) myocardial infarction (HCC) 10/30/2016  . NSTEMI (non-ST elevated myocardial infarction) (HCC) 10/30/2016     Social History   Tobacco Use  Smoking Status Never Smoker  Smokeless Tobacco Never Used    Social History   Substance and Sexual Activity  Alcohol Use No     Allergies  Allergen Reactions  . Contrast Media [Iodinated Diagnostic Agents] Anaphylaxis    Had CT scan 12/14/17, had trouble breathing, throat swelled  . Penicillins Rash and Other (See Comments)    PATIENT HAS HAD A PCN REACTION WITH IMMEDIATE RASH, FACIAL/TONGUE/THROAT SWELLING, SOB, OR LIGHTHEADEDNESS WITH HYPOTENSION:  #  #  YES  #  #  Has patient had a PCN reaction causing severe rash involving mucus membranes or skin necrosis:  no Has patient had a PCN reaction that required hospitalization:  no Has patient had a PCN reaction occurring within the last 10 years: no   . Codeine Anxiety    Dizziness and headache    Current Outpatient Medications  Medication Sig Dispense Refill  . ALPRAZolam (XANAX) 0.5 MG tablet Take 1 tablet (0.5 mg total) by mouth 2 (two) times daily as needed for anxiety. 20 tablet 0  . aspirin EC 325 MG EC tablet Take 1 tablet (325 mg total) by mouth daily. 30 tablet 0  . atorvastatin (LIPITOR) 80 MG tablet Take 1 tablet (80 mg total) by mouth daily  at 6 PM. 30 tablet 1  . butalbital-acetaminophen-caffeine (FIORICET, ESGIC) 50-325-40 MG tablet Take 1-2 tablets by mouth every 6 (six) hours as needed for headache. 20 tablet 0  . cyclobenzaprine (FLEXERIL) 10 MG tablet TAKE 1/2 TO 1 TABLET(5 TO 10 MG) BY MOUTH THREE TIMES DAILY AS NEEDED FOR MUSCLE SPASMS 30 tablet 0  . [START ON 02/27/2018] diclofenac (VOLTAREN) 75 MG EC tablet Take 1 tablet (75 mg total) by mouth 2 (two) times daily. 30 tablet 0  . ferrous sulfate 325 (65 FE) MG tablet Take 1 tablet (325 mg total) by mouth daily with breakfast. For one month then stop.  3  . fluticasone (FLONASE) 50 MCG/ACT nasal spray Place 2 sprays  into both nostrils daily. 16 g 2  . levofloxacin (LEVAQUIN) 500 MG tablet Take 1 tablet (500 mg total) by mouth daily. 5 tablet 0  . Metoprolol Tartrate 75 MG TABS Take 75 mg by mouth every 12 (twelve) hours. 60 tablet 1  . omeprazole (PRILOSEC) 20 MG capsule Take 1 capsule (20 mg total) by mouth daily. 90 capsule 1  . ondansetron (ZOFRAN-ODT) 8 MG disintegrating tablet Take 0.5-1 tablets (4-8 mg total) by mouth every 8 (eight) hours as needed for nausea. 30 tablet 0  . oxyCODONE (OXY IR/ROXICODONE) 5 MG immediate release tablet Take 5 mg by mouth every 4-6 hours PRN severe pain. 30 tablet 0  . pregabalin (LYRICA) 75 MG capsule Take 1 capsule (75 mg total) by mouth 2 (two) times daily. 60 capsule 1  . Vitamin D, Ergocalciferol, (DRISDOL) 50000 units CAPS capsule Take 1 capsule (50,000 Units total) every 7 (seven) days by mouth. 12 capsule 1   No current facility-administered medications for this visit.        Physical Exam: Ht 5' 5.5" (1.664 m)   BMI 21.42 kg/m   General appearance: alert, cooperative and no distress Heart: regular rate and rhythm Lungs: clear to auscultation bilaterally Abdomen: Benign exam Extremities: Trace edema Wound: Incisions well-healed without evidence of infection.   Diagnostic Studies & Laboratory data:     Recent Radiology Findings:   Dg Chest 2 View  Result Date: 02/20/2018 CLINICAL DATA:  Coronary artery disease post CABG in August 2019 EXAM: CHEST - 2 VIEW COMPARISON:  01/14/2018 FINDINGS: Minimal enlargement of cardiac silhouette post CABG. Mediastinal contours and pulmonary vascularity normal. Lungs clear. No pulmonary infiltrate, pleural effusion or pneumothorax. Bones unremarkable. IMPRESSION: Minimal enlargement of cardiac silhouette post CABG. No acute abnormalities. Electronically Signed   By: Ulyses Southward M.D.   On: 02/20/2018 13:15      Recent Lab Findings: Lab Results  Component Value Date   WBC 9.9 01/16/2018   HGB 9.2 (L) 01/16/2018     HCT 28.7 (L) 01/16/2018   PLT 241 01/16/2018   GLUCOSE 110 (H) 01/16/2018   CHOL 176 10/31/2016   TRIG 49 10/31/2016   HDL 59 10/31/2016   LDLCALC 107 (H) 10/31/2016   ALT 22 01/06/2018   AST 22 01/06/2018   NA 135 01/16/2018   K 3.9 01/16/2018   CL 97 (L) 01/16/2018   CREATININE 0.67 01/16/2018   BUN 10 01/16/2018   CO2 27 01/16/2018   TSH 0.66 01/29/2016   INR 1.48 01/10/2018   HGBA1C 5.6 01/09/2018      Assessment / Plan: Excellent recovery following the above described procedure.  She had many questions that were discussed during the visit and answered.  We also discussed long-term lifestyle management including nutrition.  She appears to  be motivated to live a healthy lifestyle.  We discussed activity restrictions and driving progression protocols.  She will continue to follow-up with cardiology and we have made no changes to her current medical regimen.  We will see her again on a as needed basis for any surgically related issues or as requested.          Rowe ClackWayne E Zymire Turnbo, PA-C 02/20/2018 1:18 PM  Pager [phone]

## 2018-02-20 NOTE — Patient Instructions (Signed)
As discussed continue to make nutrition and lifestyle changes.  May drive as instructed.  Lifting instructions 20 pounds.

## 2018-02-23 ENCOUNTER — Telehealth: Payer: Self-pay | Admitting: *Deleted

## 2018-02-23 NOTE — Telephone Encounter (Signed)
"  Diana [name] [phone]) trying to cancel appointment with Dr. Lindi Adie in time.  Discharged home today after quadruple bypass on my heart.  Trying to heal.  Will see surgeon farther out than a month.  Do not know if I could or shouId have any  procedures yet."  This nurse notifying Dr. Lindi Adie of request to cancel Bone Marrow Biopsy 7:30 am; Friday 03-10-2018.

## 2018-02-24 ENCOUNTER — Telehealth: Payer: Self-pay | Admitting: Hematology and Oncology

## 2018-02-24 ENCOUNTER — Telehealth (HOSPITAL_COMMUNITY): Payer: Self-pay

## 2018-02-24 NOTE — Telephone Encounter (Signed)
Called and lvm with call back number to discuss pt upcoming bone marrow appt. Per triage, pt cancelling bone marrow and md appt, coming up due to recent surgery. Will cancel appt and pt may call and reschedule when she is recovered from surgery.

## 2018-02-24 NOTE — Telephone Encounter (Signed)
Pt called to cancel appts b/c of surgery

## 2018-03-03 DIAGNOSIS — G4700 Insomnia, unspecified: Secondary | ICD-10-CM | POA: Insufficient documentation

## 2018-03-06 ENCOUNTER — Ambulatory Visit: Payer: Self-pay | Admitting: Cardiology

## 2018-03-08 ENCOUNTER — Telehealth: Payer: Self-pay | Admitting: Adult Health

## 2018-03-08 NOTE — Telephone Encounter (Signed)
Tried to reach regarding 10/11 I did leave a message

## 2018-03-09 ENCOUNTER — Telehealth: Payer: Self-pay

## 2018-03-09 NOTE — Telephone Encounter (Signed)
Called pt in regards to BMBX on 03/10/18.  No answer left VM.

## 2018-03-10 ENCOUNTER — Encounter: Payer: Self-pay | Admitting: Cardiology

## 2018-03-10 ENCOUNTER — Ambulatory Visit (INDEPENDENT_AMBULATORY_CARE_PROVIDER_SITE_OTHER): Payer: BLUE CROSS/BLUE SHIELD | Admitting: Cardiology

## 2018-03-10 VITALS — BP 120/66 | HR 73 | Ht 63.0 in | Wt 137.8 lb

## 2018-03-10 DIAGNOSIS — I25759 Atherosclerosis of native coronary artery of transplanted heart with unspecified angina pectoris: Secondary | ICD-10-CM

## 2018-03-10 DIAGNOSIS — E785 Hyperlipidemia, unspecified: Secondary | ICD-10-CM | POA: Diagnosis not present

## 2018-03-10 DIAGNOSIS — Z951 Presence of aortocoronary bypass graft: Secondary | ICD-10-CM | POA: Diagnosis not present

## 2018-03-10 DIAGNOSIS — I10 Essential (primary) hypertension: Secondary | ICD-10-CM

## 2018-03-10 NOTE — Progress Notes (Signed)
Cardiology Office Note:    Date:  03/10/2018   ID:  Diana [name], DOB 12-16-1955, MRN [id number]  PCP:  Porfirio Oar, PA  Cardiologist:  Gypsy Balsam, MD    Referring MD: Sherren Mocha, MD   Chief Complaint  Patient presents with  . Follow-up  Doing well  History of Present Illness:    Diana [name] is a 62 y.o. female with premature coronary artery disease.  Just about 2 months ago she ended up having quadruple bypass surgery secondary to advanced coronary artery disease.  She went to surgery with no major complications recovering slowly still complaining of having some pain in the incisional site but otherwise seems to be doing well.  Still complaining of having fatigue and tiredness.  Did not start rehab yet. We tried to figure out why this lady ended up having coronary artery disease she did have some dyslipidemia but nothing dramatic she never smoked, mild hypertension but now seems to be under control.  The key right now will be to modify her risk factors for coronary artery disease  Past Medical History:  Diagnosis Date  . Allergy   . Arthritis    feet  . Bronchitis 10/30/2016  . Carpal tunnel syndrome   . Coronary artery disease   . GERD (gastroesophageal reflux disease)   . Hyperlipidemia   . Hypertension   . Non-ST elevation (NSTEMI) myocardial infarction (HCC) 10/30/2016  . NSTEMI (non-ST elevated myocardial infarction) (HCC) 10/30/2016    Past Surgical History:  Procedure Laterality Date  . CARPAL TUNNEL RELEASE Left 02/12/2016   Procedure: LEFT CARPAL TUNNEL RELEASE;  Surgeon: Cindee Salt, MD;  Location: [facility];  Service: Orthopedics;  Laterality: Left;  FAB  . CORONARY ARTERY BYPASS GRAFT N/A 01/10/2018   Procedure: CORONARY ARTERY BYPASS GRAFTING (CABG) TIMES 4, USING LEFT INTERNAL MAMMARY ARTERY TO OM1  AND ENDOSCOPICALLY HARVESTED RIGHT SAPHENOUS VEIN TO DIAGONAL, AND SEQUENTIALLY TO OM2 AND DISTAL CIRCUMFLEX;  Surgeon: Delight Ovens,  MD;  Location: [facility] OR;  Service: Open Heart Surgery;  Laterality: N/A;  . LEFT HEART CATH AND CORONARY ANGIOGRAPHY N/A 01/05/2018   Procedure: LEFT HEART CATH AND CORONARY ANGIOGRAPHY;  Surgeon: Lennette Bihari, MD;  Location: [facility] INVASIVE CV LAB;  Service: Cardiovascular;  Laterality: N/A;  . TEE WITHOUT CARDIOVERSION N/A 01/10/2018   Procedure: TRANSESOPHAGEAL ECHOCARDIOGRAM (TEE);  Surgeon: Delight Ovens, MD;  Location: [facility];  Service: Open Heart Surgery;  Laterality: N/A;  . TONSILLECTOMY    . TUBAL LIGATION    . WRIST SURGERY Right     Current Medications: Current Meds  Medication Sig  . ALPRAZolam (XANAX) 0.5 MG tablet Take 1 tablet (0.5 mg total) by mouth 2 (two) times daily as needed for anxiety.  Marland Kitchen aspirin EC 325 MG EC tablet Take 1 tablet (325 mg total) by mouth daily.  Marland Kitchen atorvastatin (LIPITOR) 80 MG tablet Take 1 tablet (80 mg total) by mouth daily at 6 PM. (Patient taking differently: Take 40 mg by mouth daily at 6 PM. )  . cyclobenzaprine (FLEXERIL) 10 MG tablet TAKE 1/2 TO 1 TABLET(5 TO 10 MG) BY MOUTH THREE TIMES DAILY AS NEEDED FOR MUSCLE SPASMS  . diclofenac (VOLTAREN) 75 MG EC tablet Take 1 tablet (75 mg total) by mouth 2 (two) times daily.  . Metoprolol Tartrate 75 MG TABS Take 75 mg by mouth every 12 (twelve) hours.  Marland Kitchen omeprazole (PRILOSEC) 20 MG capsule Take 1 capsule (20 mg total) by mouth  daily.  . pregabalin (LYRICA) 75 MG capsule Take 1 capsule (75 mg total) by mouth 2 (two) times daily.     Allergies:   Contrast media [iodinated diagnostic agents]; Penicillins; and Codeine   Social History   Socioeconomic History  . Marital status: Single    Spouse name: Diana [name]  . Number of children: 2  . Years of education: 12th grade  . Highest education level: Not on file  Occupational History  . Occupation: Warden/ranger: [employer]  Social Needs  . Financial resource strain: Not on file  . Food insecurity:    Worry: Not on file    Inability: Not on  file  . Transportation needs:    Medical: Not on file    Non-medical: Not on file  Tobacco Use  . Smoking status: Never Smoker  . Smokeless tobacco: Never Used  Substance and Sexual Activity  . Alcohol use: No  . Drug use: No  . Sexual activity: Not Currently  Lifestyle  . Physical activity:    Days per week: Not on file    Minutes per session: Not on file  . Stress: Not on file  Relationships  . Social connections:    Talks on phone: Not on file    Gets together: Not on file    Attends religious service: Not on file    Active member of club or organization: Not on file    Attends meetings of clubs or organizations: Not on file    Relationship status: Not on file  Other Topics Concern  . Not on file  Social History Narrative   Divorced from her husband. She has two adult sons from that union . One lives in [place], the other lives in [place] (as a Solicitor) following Office manager.   Married Diana [name] 05/2014.     Family History: The patient's family history includes Brain cancer in her sister; HIV in her sister; Healthy in her brother; Hypertension in her mother; Liver disease in her brother; Thyroid disease in her mother. ROS:   Please see the history of present illness.    All 14 point review of systems negative except as described per history of present illness  EKGs/Labs/Other Studies Reviewed:      Recent Labs: 01/06/2018: ALT 22 01/11/2018: Magnesium 1.9 01/16/2018: BUN 10; Creatinine, Ser 0.67; Hemoglobin 9.2; Platelets 241; Potassium 3.9; Sodium 135  Recent Lipid Panel    Component Value Date/Time   CHOL 176 10/31/2016 0129   TRIG 49 10/31/2016 0129   HDL 59 10/31/2016 0129   CHOLHDL 3.0 10/31/2016 0129   VLDL 10 10/31/2016 0129   LDLCALC 107 (H) 10/31/2016 0129    Physical Exam:    VS:  BP 120/66   Pulse 73   Ht 5\' 3"  (1.6 m)   Wt 137 lb 12.8 oz (62.5 kg)   SpO2 96%   BMI 24.41 kg/m     Wt Readings from Last 3 Encounters:  03/10/18 137 lb 12.8 oz (62.5  kg)  02/20/18 135 lb (61.2 kg)  01/19/18 130 lb 11.7 oz (59.3 kg)     GEN:  Well nourished, well developed in no acute distress HEENT: Normal NECK: No JVD; No carotid bruits LYMPHATICS: No lymphadenopathy CARDIAC: RRR, no murmurs, no rubs, no gallops RESPIRATORY:  Clear to auscultation without rales, wheezing or rhonchi  ABDOMEN: Soft, non-tender, non-distended MUSCULOSKELETAL:  No edema; No deformity  SKIN: Warm and dry LOWER EXTREMITIES: no swelling NEUROLOGIC:  Alert and  oriented x 3 PSYCHIATRIC:  Normal affect   ASSESSMENT:    1. Hyperlipidemia, unspecified hyperlipidemia type   2. S/P CABG x 4   3. 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 Benign essential HTN    PLAN:    In order of problems listed above:  1. Coronary artery disease status post coronary bypass graft x4.  Wound healed completely blocked and sternum as well as her leg.  Somewhat tender however overall healed completely.  I did review her medications all appropriate and I will continue. 2. Dyslipidemia we will check her fasting lipid profile today the key will be to reduce her LDL well below 70.  She may require some additional medication on top of Lipitor. 3. Benign essential hypertension blood pressure well controlled continue present management. 4. We talked about healthy lifestyle Mediterranean diet exercises on the regular basis she is eager to join the gym and I think she can benefit from that.  Not ready to go back to work yet.   Medication Adjustments/Labs and Tests Ordered: Current medicines are reviewed at length with the patient today.  Concerns regarding medicines are outlined above.  Orders Placed This Encounter  Procedures  . Hepatic function panel  . Lipid panel  . AMB referral to cardiac rehabilitation   Medication changes: No orders of the defined types were placed in this encounter.   Signed, Georgeanna Lea, MD,  [facility] 03/10/2018 4:21 PM    [facility]

## 2018-03-10 NOTE — Patient Instructions (Signed)
Medication Instructions:  Your physician recommends that you continue on your current medications as directed. Please refer to the Current Medication list given to you today.  If you need a refill on your cardiac medications before your next appointment, please call your pharmacy.   Lab work: Your physician recommends that you return for lab work today:   LFT and Lipids   If you have labs (blood work) drawn today and your tests are completely normal, you will receive your results only by: Marland Kitchen MyChart Message (if you have MyChart) OR . A paper copy in the mail If you have any lab test that is abnormal or we need to change your treatment, we will call you to review the results.  Testing/Procedures: None.  Follow-Up: At [facility], you and your health needs are our priority.  As part of our continuing mission to provide you with exceptional heart care, we have created designated Provider Care Teams.  These Care Teams include your primary Cardiologist (physician) and Advanced Practice Providers (APPs -  Physician Assistants and Nurse Practitioners) who all work together to provide you with the care you need, when you need it. You will need a follow up appointment in 6 months.  Please call our office 2 months in advance to schedule this appointment.  You may see No primary care provider on file. or another member of our [employer] Provider Team in [place]: Norman Herrlich, MD . Belva Crome, MD  Any Other Special Instructions Will Be Listed Below (If Applicable).   Dr.Krasowski has referred you to cardiac rehab with [facility], they should call you within one week with an appointment. If not please call out office.

## 2018-03-11 LAB — HEPATIC FUNCTION PANEL
ALBUMIN: 4.5 g/dL (ref 3.6–4.8)
ALK PHOS: 99 IU/L (ref 39–117)
ALT: 49 IU/L — ABNORMAL HIGH (ref 0–32)
AST: 44 IU/L — AB (ref 0–40)
Bilirubin, Direct: 0.06 mg/dL (ref 0.00–0.40)
Total Protein: 7.6 g/dL (ref 6.0–8.5)

## 2018-03-11 LAB — LIPID PANEL
CHOL/HDL RATIO: 2.9 ratio (ref 0.0–4.4)
Cholesterol, Total: 128 mg/dL (ref 100–199)
HDL: 44 mg/dL (ref >39)
LDL Calculated: 50 mg/dL (ref 0–99)
Triglycerides: 168 mg/dL — ABNORMAL HIGH (ref 0–149)
VLDL Cholesterol Cal: 34 mg/dL (ref 5–40)

## 2018-03-13 ENCOUNTER — Other Ambulatory Visit: Payer: Self-pay

## 2018-03-13 DIAGNOSIS — R945 Abnormal results of liver function studies: Principal | ICD-10-CM

## 2018-03-13 DIAGNOSIS — R7989 Other specified abnormal findings of blood chemistry: Secondary | ICD-10-CM

## 2018-03-14 ENCOUNTER — Telehealth: Payer: Self-pay | Admitting: Cardiology

## 2018-03-14 ENCOUNTER — Telehealth: Payer: Self-pay

## 2018-03-14 NOTE — Telephone Encounter (Signed)
Patient is asking for pain medications.

## 2018-03-14 NOTE — Telephone Encounter (Signed)
Patient contacted the office requesting pain medication for right chest/ throat pain.  I advised that she is too far out from surgery to get any pain medications.  I did advise her that if it is her throat that is bothering her, she should contact her PCP and get an appointment to be seen.  I also advised her that if her chest is hurting, she should contact her Cardiologist and get an appointment to be seen regarding the chest pain.  She acknowledged receipt.  Patient was cleared from our office unless any surgical related/ incision issues arise.

## 2018-03-14 NOTE — Telephone Encounter (Signed)
Advised patient to call the CTS surgeon who performed her CABG as Dr. Bing Matter would not be able to fill this.

## 2018-03-16 ENCOUNTER — Ambulatory Visit: Payer: Self-pay | Admitting: Hematology and Oncology

## 2018-03-16 ENCOUNTER — Other Ambulatory Visit: Payer: Self-pay

## 2018-03-28 ENCOUNTER — Other Ambulatory Visit: Payer: Self-pay | Admitting: Physician Assistant

## 2018-04-03 NOTE — Progress Notes (Signed)
Diana [name] 62 y.o. female DOB Jul 29, 1955 MRN [id number]       Nutrition  No diagnosis found. Past Medical History:  Diagnosis Date  . Allergy   . Arthritis    feet  . Bronchitis 10/30/2016  . Carpal tunnel syndrome   . Coronary artery disease   . GERD (gastroesophageal reflux disease)   . Hyperlipidemia   . Hypertension   . Non-ST elevation (NSTEMI) myocardial infarction (HCC) 10/30/2016  . NSTEMI (non-ST elevated myocardial infarction) (HCC) 10/30/2016   Meds reviewed.     Current Outpatient Medications (Cardiovascular):  .  atorvastatin (LIPITOR) 80 MG tablet, Take 1 tablet (80 mg total) by mouth daily at 6 PM. (Patient taking differently: Take 40 mg by mouth daily at 6 PM. ) .  Metoprolol Tartrate 75 MG TABS, Take 75 mg by mouth every 12 (twelve) hours.   Current Outpatient Medications (Analgesics):  .  aspirin EC 325 MG EC tablet, Take 1 tablet (325 mg total) by mouth daily. .  diclofenac (VOLTAREN) 75 MG EC tablet, Take 1 tablet (75 mg total) by mouth 2 (two) times daily.   Current Outpatient Medications (Other):  Marland Kitchen  ALPRAZolam (XANAX) 0.5 MG tablet, Take 1 tablet (0.5 mg total) by mouth 2 (two) times daily as needed for anxiety. .  cyclobenzaprine (FLEXERIL) 10 MG tablet, TAKE 1/2 TO 1 TABLET(5 TO 10 MG) BY MOUTH THREE TIMES DAILY AS NEEDED FOR MUSCLE SPASMS .  omeprazole (PRILOSEC) 20 MG capsule, Take 1 capsule (20 mg total) by mouth daily. .  pregabalin (LYRICA) 75 MG capsule, Take 1 capsule (75 mg total) by mouth 2 (two) times daily.   HT: Ht Readings from Last 1 Encounters:  03/10/18 5\' 3"  (1.6 m)    WT: Wt Readings from Last 5 Encounters:  03/10/18 137 lb 12.8 oz (62.5 kg)  02/20/18 135 lb (61.2 kg)  01/19/18 130 lb 11.7 oz (59.3 kg)  01/02/18 133 lb 3.2 oz (60.4 kg)  12/15/17 131 lb (59.4 kg)     BMI = 24.42 (03/10/18)  Current tobacco use? No       Labs:  Lipid Panel     Component Value Date/Time   CHOL 128 03/10/2018 1615   TRIG 168 (H)  03/10/2018 1615   HDL 44 03/10/2018 1615   CHOLHDL 2.9 03/10/2018 1615   CHOLHDL 3.0 10/31/2016 0129   VLDL 10 10/31/2016 0129   LDLCALC 50 03/10/2018 1615    Lab Results  Component Value Date   HGBA1C 5.6 01/09/2018   CBG (last 3)  No results for input(s): GLUCAP in the last 72 hours.  Nutrition Diagnosis ? Food-and nutrition-related knowledge deficit related to lack of exposure to information as related to diagnosis of: ? CVD   Nutrition Goal(s):  ? To be determined  Plan:  Pt to attend nutrition classes ? Nutrition I ? Nutrition II ? Portion Distortion  ? Diabetes Blitz ? Diabetes Q & A Will provide client-centered nutrition education as part of interdisciplinary care.   Monitor and evaluate progress toward nutrition goal with team.  Ross Marcus, MS, RD, LDN 04/03/2018 2:54 PM

## 2018-04-05 ENCOUNTER — Other Ambulatory Visit: Payer: Self-pay

## 2018-04-05 ENCOUNTER — Telehealth: Payer: Self-pay | Admitting: Cardiology

## 2018-04-05 ENCOUNTER — Telehealth (HOSPITAL_COMMUNITY): Payer: Self-pay

## 2018-04-05 MED ORDER — ATORVASTATIN CALCIUM 80 MG PO TABS: 40.0000 mg | ORAL_TABLET | Freq: Every day | ORAL | 1 refills | Status: DC

## 2018-04-05 NOTE — Telephone Encounter (Signed)
° ° ° °  1. Which medications need to be refilled? (please list name of each medication and dose if known) atorvastatin 40mg  tablets one tablet daily  2. Which pharmacy/location (including street and city if local pharmacy) is medication to be sent to?[facility] on [address]  3. Do they need a 30 day or 90 day supply? 30

## 2018-04-05 NOTE — Telephone Encounter (Signed)
Med refill has been sent. 

## 2018-04-05 NOTE — Telephone Encounter (Signed)
Patient states she spoke with someone at the Cardiac Rehab clinic yesterday and stated she would bring in her medication list to review at the orientation appointment.  Danae Orleans, PharmD PGY1 Pharmacy Resident Phone [phone] 04/05/2018       4:35 PM

## 2018-04-06 ENCOUNTER — Encounter (HOSPITAL_COMMUNITY)
Admission: RE | Admit: 2018-04-06 | Discharge: 2018-04-06 | Disposition: A | Discharge status: Home / Self Care | Payer: BLUE CROSS/BLUE SHIELD | Source: Ambulatory Visit | Attending: Cardiology | Admitting: Cardiology

## 2018-04-06 VITALS — Ht 62.0 in | Wt 139.3 lb

## 2018-04-06 DIAGNOSIS — Z951 Presence of aortocoronary bypass graft: Secondary | ICD-10-CM | POA: Diagnosis not present

## 2018-04-06 NOTE — Progress Notes (Signed)
Cardiac Individual Treatment Plan  Patient Details  Name: Diana [name] MRN: [id number] Date of Birth: 1955-10-01 Referring Provider:     CARDIAC REHAB PHASE II ORIENTATION from 04/06/2018 in [facility] CARDIAC [facility]  Referring Provider  Tilley/Krasowski      Initial Encounter Date:    CARDIAC REHAB PHASE II ORIENTATION from 04/06/2018 in [facility] CARDIAC REHAB  Date  04/06/18      Visit Diagnosis: S/P CABG x 4  Patient's Home Medications on Admission:  Current Outpatient Medications:  .  aspirin EC 325 MG EC tablet, Take 1 tablet (325 mg total) by mouth daily., Disp: 30 tablet, Rfl: 0 .  atorvastatin (LIPITOR) 80 MG tablet, Take 0.5 tablets (40 mg total) by mouth daily at 6 PM., Disp: 60 tablet, Rfl: 1 .  cyclobenzaprine (FLEXERIL) 10 MG tablet, TAKE 1/2 TO 1 TABLET(5 TO 10 MG) BY MOUTH THREE TIMES DAILY AS NEEDED FOR MUSCLE SPASMS, Disp: 30 tablet, Rfl: 0 .  Metoprolol Tartrate 75 MG TABS, Take 75 mg by mouth every 12 (twelve) hours., Disp: 60 tablet, Rfl: 1 .  omeprazole (PRILOSEC) 20 MG capsule, Take 1 capsule (20 mg total) by mouth daily., Disp: 90 capsule, Rfl: 1 .  pregabalin (LYRICA) 150 MG capsule, Take 150 mg by mouth 2 (two) times daily., Disp: , Rfl:  .  traZODone (DESYREL) 50 MG tablet, Take 25-50 mg by mouth at bedtime as needed for sleep., Disp: , Rfl:  .  ALPRAZolam (XANAX) 0.5 MG tablet, Take 1 tablet (0.5 mg total) by mouth 2 (two) times daily as needed for anxiety. (Patient not taking: Reported on 04/06/2018), Disp: 20 tablet, Rfl: 0 .  diclofenac (VOLTAREN) 75 MG EC tablet, Take 1 tablet (75 mg total) by mouth 2 (two) times daily. (Patient not taking: Reported on 04/06/2018), Disp: 30 tablet, Rfl: 0 .  pregabalin (LYRICA) 75 MG capsule, Take 1 capsule (75 mg total) by mouth 2 (two) times daily., Disp: 60 capsule, Rfl: 1  Past Medical History: Past Medical History:  Diagnosis Date  . Allergy   . Arthritis    feet  . Bronchitis  10/30/2016  . Carpal tunnel syndrome   . Coronary artery disease   . GERD (gastroesophageal reflux disease)   . Hyperlipidemia   . Hypertension   . Non-ST elevation (NSTEMI) myocardial infarction (HCC) 10/30/2016  . NSTEMI (non-ST elevated myocardial infarction) (HCC) 10/30/2016    Tobacco Use: Social History   Tobacco Use  Smoking Status Never Smoker  Smokeless Tobacco Never Used    Labs: Recent Review Flowsheet Data    Labs for ITP Cardiac and Pulmonary Rehab Latest Ref Rng & Units 01/10/2018 01/10/2018 01/10/2018 01/11/2018 03/10/2018   Cholestrol 100 - 199 mg/dL - - - - 409   LDLCALC 0 - 99 mg/dL - - - - 50   HDL >81 mg/dL - - - - 44   Trlycerides 0 - 149 mg/dL - - - - 191(Y)   Hemoglobin A1c 4.8 - 5.6 % - - - - -   PHART 7.350 - 7.450 7.337(L) 7.431 - - -   PCO2ART 32.0 - 48.0 mmHg 47.2 36.1 - - -   HCO3 20.0 - 28.0 mmol/L 25.5 24.1 - - -   TCO2 22 - 32 mmol/L 27 25 24 26  -   ACIDBASEDEF 0.0 - 2.0 mmol/L 1.0 - - - -   O2SAT % 99.0 97.0 - - -      Capillary Blood Glucose: Lab Results  Component Value Date   GLUCAP 70 01/18/2018   GLUCAP 110 (H) 01/14/2018   GLUCAP 94 01/13/2018   GLUCAP 109 (H) 01/13/2018   GLUCAP 96 01/13/2018     Exercise Target Goals: Exercise Program Goal: Individual exercise prescription set using results from initial 6 min walk test and THRR while considering  patient's activity barriers and safety.   Exercise Prescription Goal: Initial exercise prescription builds to 30-45 minutes a day of aerobic activity, 2-3 days per week.  Home exercise guidelines will be given to patient during program as part of exercise prescription that the participant will acknowledge.  Activity Barriers & Risk Stratification: Activity Barriers & Cardiac Risk Stratification - 04/06/18 1624      Activity Barriers & Cardiac Risk Stratification   Activity Barriers  Muscular Weakness;Incisional Pain;Deconditioning   Right leg pain   Cardiac Risk Stratification  High        6 Minute Walk: 6 Minute Walk    Row Name 04/06/18 1604         6 Minute Walk   Phase  Initial     Distance  1561 feet     Walk Time  6 minutes     MPH  2.96     METS  3.61     RPE  11     Perceived Dyspnea   0     VO2 Peak  12.63     Symptoms  Yes (comment)     Comments  Right leg pain     Resting HR  73 bpm     Resting BP  112/68     Resting Oxygen Saturation   99 %     Exercise Oxygen Saturation  during 6 min walk  99 %     Max Ex. HR  76 bpm     Max Ex. BP  120/72     2 Minute Post BP  118/68        Oxygen Initial Assessment:   Oxygen Re-Evaluation:   Oxygen Discharge (Final Oxygen Re-Evaluation):   Initial Exercise Prescription: Initial Exercise Prescription - 04/06/18 1500      Date of Initial Exercise RX and Referring Provider   Date  04/06/18    Referring Provider  Tilley/Krasowski    Expected Discharge Date  07/14/17      NuStep   Level  3    Minutes  10    METs  3.4      Arm Ergometer   Level  2    Watts  30    Minutes  10      Track   Laps  12    Minutes  10    METs  3      Prescription Details   Frequency (times per week)  3    Duration  Progress to 30 minutes of continuous aerobic without signs/symptoms of physical distress      Intensity   THRR 40-80% of Max Heartrate  64-128    Ratings of Perceived Exertion  11-13      Progression   Progression  Continue to progress workloads to maintain intensity without signs/symptoms of physical distress.      Resistance Training   Training Prescription  Yes    Weight  3lb    Reps  10-15       Perform Capillary Blood Glucose checks as needed.  Exercise Prescription Changes:   Exercise Comments:   Exercise Goals and Review: Exercise Goals  Row Name 04/06/18 1607             Exercise Goals   Increase Physical Activity  Yes       Intervention  Develop an individualized exercise prescription for aerobic and resistive training based on initial evaluation findings,  risk stratification, comorbidities and participant's personal goals.;Provide advice, education, support and counseling about physical activity/exercise needs.       Expected Outcomes  Short Term: Attend rehab on a regular basis to increase amount of physical activity.       Intervention  Provide advice, education, support and counseling about physical activity/exercise needs.;Develop an individualized exercise prescription for aerobic and resistive training based on initial evaluation findings, risk stratification, comorbidities and participant's personal goals.       Expected Outcomes  Short Term: Increase workloads from initial exercise prescription for resistance, speed, and METs.       Able to understand and use rate of perceived exertion (RPE) scale  Yes       Intervention  Provide education and explanation on how to use RPE scale       Expected Outcomes  Short Term: Able to use RPE daily in rehab to express subjective intensity level;Long Term:  Able to use RPE to guide intensity level when exercising independently       Knowledge and understanding of Target Heart Rate Range (THRR)  Yes       Intervention  Provide education and explanation of THRR including how the numbers were predicted and where they are located for reference       Expected Outcomes  Short Term: Able to state/look up THRR       Able to check pulse independently  Yes       Intervention  Provide education and demonstration on how to check pulse in carotid and radial arteries.;Review the importance of being able to check your own pulse for safety during independent exercise       Expected Outcomes  Short Term: Able to explain why pulse checking is important during independent exercise;Long Term: Able to check pulse independently and accurately       Understanding of Exercise Prescription  Yes       Intervention  Provide education, explanation, and written materials on patient's individual exercise prescription       Expected  Outcomes  Short Term: Able to explain program exercise prescription;Long Term: Able to explain home exercise prescription to exercise independently          Exercise Goals Re-Evaluation :   Discharge Exercise Prescription (Final Exercise Prescription Changes):   Nutrition:  Target Goals: Understanding of nutrition guidelines, daily intake of sodium 1500mg , cholesterol 200mg , calories 30% from fat and 7% or less from saturated fats, daily to have 5 or more servings of fruits and vegetables.  Biometrics: Pre Biometrics - 04/06/18 1602      Pre Biometrics   Height  5\' 2"  (1.575 m)    Weight  63.2 kg    Waist Circumference  36 inches    Hip Circumference  39 inches    Waist to Hip Ratio  0.92 %    BMI (Calculated)  25.48    Triceps Skinfold  30 mm    % Body Fat  38.3 %    Grip Strength  25 kg    Flexibility  13 in    Single Leg Stand  3.03 seconds        Nutrition Therapy Plan and Nutrition Goals:  Nutrition Assessments:   Nutrition Goals Re-Evaluation:   Nutrition Goals Re-Evaluation:   Nutrition Goals Discharge (Final Nutrition Goals Re-Evaluation):   Psychosocial: Target Goals: Acknowledge presence or absence of significant depression and/or stress, maximize coping skills, provide positive support system. Participant is able to verbalize types and ability to use techniques and skills needed for reducing stress and depression.  Initial Review & Psychosocial Screening: Initial Psych Review & Screening - 04/06/18 1615      Initial Review   Current issues with  Current Stress Concerns    Source of Stress Concerns  Occupation;Financial    Comments  Briyonna is on leave from work.  She had to move in with her mother due to loss of income.       Family Dynamics   Good Support System?  Yes   Her mother is a source of support for her.      Barriers   Psychosocial barriers to participate in program  The patient should benefit from training in stress management and  relaxation.      Screening Interventions   Interventions  Encouraged to exercise    Expected Outcomes  Short Term goal: Utilizing psychosocial counselor, staff and physician to assist with identification of specific Stressors or current issues interfering with healing process. Setting desired goal for each stressor or current issue identified.;Long Term Goal: Stressors or current issues are controlled or eliminated.       Quality of Life Scores: Quality of Life - 04/06/18 1616      Quality of Life   Select  Quality of Life      Quality of Life Scores   Health/Function Pre  24.57 %    Socioeconomic Pre  22.69 %    Psych/Spiritual Pre  25.07 %    Family Pre  27 %    GLOBAL Pre  24.59 %      Scores of 19 and below usually indicate a poorer quality of life in these areas.  A difference of  2-3 points is a clinically meaningful difference.  A difference of 2-3 points in the total score of the Quality of Life Index has been associated with significant improvement in overall quality of life, self-image, physical symptoms, and general health in studies assessing change in quality of life.  PHQ-9: Recent Review Flowsheet Data    Depression screen [facility] 2/9 01/02/2018 12/15/2017 11/14/2017 11/04/2017 04/04/2017   Decreased Interest 0 0 0 0 0   Down, Depressed, Hopeless 0 0 0 0 0   PHQ - 2 Score 0 0 0 0 0     Interpretation of Total Score  Total Score Depression Severity:  1-4 = Minimal depression, 5-9 = Mild depression, 10-14 = Moderate depression, 15-19 = Moderately severe depression, 20-27 = Severe depression   Psychosocial Evaluation and Intervention:   Psychosocial Re-Evaluation:   Psychosocial Discharge (Final Psychosocial Re-Evaluation):   Vocational Rehabilitation: Provide vocational rehab assistance to qualifying candidates.   Vocational Rehab Evaluation & Intervention: Vocational Rehab - 04/06/18 1614      Initial Vocational Rehab Evaluation & Intervention   Assessment shows  need for Vocational Rehabilitation  Yes       Education: Education Goals: Education classes will be provided on a weekly basis, covering required topics. Participant will state understanding/return demonstration of topics presented.  Learning Barriers/Preferences: Learning Barriers/Preferences - 04/06/18 1610      Learning Barriers/Preferences   Learning Barriers  None    Learning Preferences  Skilled Demonstration;Pictoral;Video  Education Topics: Count Your Pulse:  -Group instruction provided by verbal instruction, demonstration, patient participation and written materials to support subject.  Instructors address importance of being able to find your pulse and how to count your pulse when at home without a heart monitor.  Patients get hands on experience counting their pulse with staff help and individually.   Heart Attack, Angina, and Risk Factor Modification:  -Group instruction provided by verbal instruction, video, and written materials to support subject.  Instructors address signs and symptoms of angina and heart attacks.    Also discuss risk factors for heart disease and how to make changes to improve heart health risk factors.   Functional Fitness:  -Group instruction provided by verbal instruction, demonstration, patient participation, and written materials to support subject.  Instructors address safety measures for doing things around the house.  Discuss how to get up and down off the floor, how to pick things up properly, how to safely get out of a chair without assistance, and balance training.   Meditation and Mindfulness:  -Group instruction provided by verbal instruction, patient participation, and written materials to support subject.  Instructor addresses importance of mindfulness and meditation practice to help reduce stress and improve awareness.  Instructor also leads participants through a meditation exercise.    Stretching for Flexibility and Mobility:   -Group instruction provided by verbal instruction, patient participation, and written materials to support subject.  Instructors lead participants through series of stretches that are designed to increase flexibility thus improving mobility.  These stretches are additional exercise for major muscle groups that are typically performed during regular warm up and cool down.   Hands Only CPR:  -Group verbal, video, and participation provides a basic overview of AHA guidelines for community CPR. Role-play of emergencies allow participants the opportunity to practice calling for help and chest compression technique with discussion of AED use.   Hypertension: -Group verbal and written instruction that provides a basic overview of hypertension including the most recent diagnostic guidelines, risk factor reduction with self-care instructions and medication management.    Nutrition I class: Heart Healthy Eating:  -Group instruction provided by PowerPoint slides, verbal discussion, and written materials to support subject matter. The instructor gives an explanation and review of the Therapeutic Lifestyle Changes diet recommendations, which includes a discussion on lipid goals, dietary fat, sodium, fiber, plant stanol/sterol esters, sugar, and the components of a well-balanced, healthy diet.   Nutrition II class: Lifestyle Skills:  -Group instruction provided by PowerPoint slides, verbal discussion, and written materials to support subject matter. The instructor gives an explanation and review of label reading, grocery shopping for heart health, heart healthy recipe modifications, and ways to make healthier choices when eating out.   Diabetes Question & Answer:  -Group instruction provided by PowerPoint slides, verbal discussion, and written materials to support subject matter. The instructor gives an explanation and review of diabetes co-morbidities, pre- and post-prandial blood glucose goals,  pre-exercise blood glucose goals, signs, symptoms, and treatment of hypoglycemia and hyperglycemia, and foot care basics.   Diabetes Blitz:  -Group instruction provided by PowerPoint slides, verbal discussion, and written materials to support subject matter. The instructor gives an explanation and review of the physiology behind type 1 and type 2 diabetes, diabetes medications and rational behind using different medications, pre- and post-prandial blood glucose recommendations and Hemoglobin A1c goals, diabetes diet, and exercise including blood glucose guidelines for exercising safely.    Portion Distortion:  -Group instruction provided by PowerPoint slides,  verbal discussion, written materials, and food models to support subject matter. The instructor gives an explanation of serving size versus portion size, changes in portions sizes over the last 20 years, and what consists of a serving from each food group.   Stress Management:  -Group instruction provided by verbal instruction, video, and written materials to support subject matter.  Instructors review role of stress in heart disease and how to cope with stress positively.     Exercising on Your Own:  -Group instruction provided by verbal instruction, power point, and written materials to support subject.  Instructors discuss benefits of exercise, components of exercise, frequency and intensity of exercise, and end points for exercise.  Also discuss use of nitroglycerin and activating EMS.  Review options of places to exercise outside of rehab.  Review guidelines for sex with heart disease.   Cardiac Drugs I:  -Group instruction provided by verbal instruction and written materials to support subject.  Instructor reviews cardiac drug classes: antiplatelets, anticoagulants, beta blockers, and statins.  Instructor discusses reasons, side effects, and lifestyle considerations for each drug class.   Cardiac Drugs II:  -Group instruction  provided by verbal instruction and written materials to support subject.  Instructor reviews cardiac drug classes: angiotensin converting enzyme inhibitors (ACE-I), angiotensin II receptor blockers (ARBs), nitrates, and calcium channel blockers.  Instructor discusses reasons, side effects, and lifestyle considerations for each drug class.   Anatomy and Physiology of the Circulatory System:  Group verbal and written instruction and models provide basic cardiac anatomy and physiology, with the coronary electrical and arterial systems. Review of: AMI, Angina, Valve disease, Heart Failure, Peripheral Artery Disease, Cardiac Arrhythmia, Pacemakers, and the ICD.   Other Education:  -Group or individual verbal, written, or video instructions that support the educational goals of the cardiac rehab program.   Holiday Eating Survival Tips:  -Group instruction provided by PowerPoint slides, verbal discussion, and written materials to support subject matter. The instructor gives patients tips, tricks, and techniques to help them not only survive but enjoy the holidays despite the onslaught of food that accompanies the holidays.   Knowledge Questionnaire Score: Knowledge Questionnaire Score - 04/06/18 1616      Knowledge Questionnaire Score   Pre Score  17/24       Core Components/Risk Factors/Patient Goals at Admission: Personal Goals and Risk Factors at Admission - 04/06/18 1608      Core Components/Risk Factors/Patient Goals on Admission    Weight Management  Weight Maintenance;Yes    Intervention  Weight Management: Develop a combined nutrition and exercise program designed to reach desired caloric intake, while maintaining appropriate intake of nutrient and fiber, sodium and fats, and appropriate energy expenditure required for the weight goal.;Weight Management: Provide education and appropriate resources to help participant work on and attain dietary goals.    Admit Weight  137 lb 5.6 oz (62.3  kg)    Expected Outcomes  Weight Maintenance: Understanding of the daily nutrition guidelines, which includes 25-35% calories from fat, 7% or less cal from saturated fats, less than 200mg  cholesterol, less than 1.5gm of sodium, & 5 or more servings of fruits and vegetables daily;Short Term: Continue to assess and modify interventions until short term weight is achieved;Long Term: Adherence to nutrition and physical activity/exercise program aimed toward attainment of established weight goal;Understanding recommendations for meals to include 15-35% energy as protein, 25-35% energy from fat, 35-60% energy from carbohydrates, less than 200mg  of dietary cholesterol, 20-35 gm of total fiber daily;Understanding of distribution of  calorie intake throughout the day with the consumption of 4-5 meals/snacks    Hypertension  Yes    Intervention  Provide education on lifestyle modifcations including regular physical activity/exercise, weight management, moderate sodium restriction and increased consumption of fresh fruit, vegetables, and low fat dairy, alcohol moderation, and smoking cessation.;Monitor prescription use compliance.    Expected Outcomes  Short Term: Continued assessment and intervention until BP is < 140/67mm HG in hypertensive participants. < 130/68mm HG in hypertensive participants with diabetes, heart failure or chronic kidney disease.;Long Term: Maintenance of blood pressure at goal levels.    Lipids  Yes    Intervention  Provide education and support for participant on nutrition & aerobic/resistive exercise along with prescribed medications to achieve LDL 70mg , HDL >40mg .    Expected Outcomes  Short Term: Participant states understanding of desired cholesterol values and is compliant with medications prescribed. Participant is following exercise prescription and nutrition guidelines.;Long Term: Cholesterol controlled with medications as prescribed, with individualized exercise RX and with  personalized nutrition plan. Value goals: LDL < 70mg , HDL > 40 mg.    Stress  Yes    Intervention  Offer individual and/or small group education and counseling on adjustment to heart disease, stress management and health-related lifestyle change. Teach and support self-help strategies.;Refer participants experiencing significant psychosocial distress to appropriate mental health specialists for further evaluation and treatment. When possible, include family members and significant others in education/counseling sessions.       Core Components/Risk Factors/Patient Goals Review:    Core Components/Risk Factors/Patient Goals at Discharge (Final Review):    ITP Comments: ITP Comments    Row Name 04/06/18 1612           ITP Comments  Dr. Armanda Magic Medical Director          Comments: Patient attended orientation from 1338 to 1529 to review rules and guidelines for program. Completed 6 minute walk test, Intitial ITP, and exercise prescription.  VSS. Telemetry-SR.  Asymptomatic.

## 2018-04-06 NOTE — Progress Notes (Signed)
Cardiac Rehab Medication Review by a RN  Does the patient  feel that his/her medications are working for him/her?  yes  Has the patient been experiencing any side effects to the medications prescribed?  no  Does the patient measure his/her own blood pressure or blood glucose at home?  yes   Does the patient have any problems obtaining medications due to transportation or finances?   Not currently.  Diana [name] had previous had problems affording her medications but this has been resolved.   Understanding of regimen: good Understanding of indications: good Potential of compliance: good    RN comments: N/A    Nikki Dom 04/06/2018 4:31 PM

## 2018-04-07 NOTE — Progress Notes (Signed)
EIMI VINEY 62 y.o. female DOB: 05/31/1956 MRN: [id number]      Nutrition Note  1. S/P CABG x 4    Past Medical History:  Diagnosis Date  . Allergy   . Arthritis    feet  . Bronchitis 10/30/2016  . Carpal tunnel syndrome   . Coronary artery disease   . GERD (gastroesophageal reflux disease)   . Hyperlipidemia   . Hypertension   . Non-ST elevation (NSTEMI) myocardial infarction (HCC) 10/30/2016  . NSTEMI (non-ST elevated myocardial infarction) (HCC) 10/30/2016   Meds reviewed.    Current Outpatient Medications (Cardiovascular):  .  atorvastatin (LIPITOR) 80 MG tablet, Take 0.5 tablets (40 mg total) by mouth daily at 6 PM. .  Metoprolol Tartrate 75 MG TABS, Take 75 mg by mouth every 12 (twelve) hours.   Current Outpatient Medications (Analgesics):  .  aspirin EC 325 MG EC tablet, Take 1 tablet (325 mg total) by mouth daily. .  diclofenac (VOLTAREN) 75 MG EC tablet, Take 1 tablet (75 mg total) by mouth 2 (two) times daily. (Patient not taking: Reported on 04/06/2018)   Current Outpatient Medications (Other):  .  cyclobenzaprine (FLEXERIL) 10 MG tablet, TAKE 1/2 TO 1 TABLET(5 TO 10 MG) BY MOUTH THREE TIMES DAILY AS NEEDED FOR MUSCLE SPASMS .  omeprazole (PRILOSEC) 20 MG capsule, Take 1 capsule (20 mg total) by mouth daily. .  pregabalin (LYRICA) 150 MG capsule, Take 150 mg by mouth 2 (two) times daily. .  traZODone (DESYREL) 50 MG tablet, Take 25-50 mg by mouth at bedtime as needed for sleep. Marland Kitchen  ALPRAZolam (XANAX) 0.5 MG tablet, Take 1 tablet (0.5 mg total) by mouth 2 (two) times daily as needed for anxiety. (Patient not taking: Reported on 04/06/2018) .  pregabalin (LYRICA) 75 MG capsule, Take 1 capsule (75 mg total) by mouth 2 (two) times daily.   HT: Ht Readings from Last 1 Encounters:  04/06/18 5\' 2"  (1.575 m)    WT: Wt Readings from Last 5 Encounters:  04/06/18 139 lb 5.3 oz (63.2 kg)  03/10/18 137 lb 12.8 oz (62.5 kg)  02/20/18 135 lb (61.2 kg)  01/19/18 130 lb 11.7 oz  (59.3 kg)  01/02/18 133 lb 3.2 oz (60.4 kg)     Body mass index is 25.48 kg/m.   Current tobacco use? No  Labs:  Lipid Panel     Component Value Date/Time   CHOL 128 03/10/2018 1615   TRIG 168 (H) 03/10/2018 1615   HDL 44 03/10/2018 1615   CHOLHDL 2.9 03/10/2018 1615   CHOLHDL 3.0 10/31/2016 0129   VLDL 10 10/31/2016 0129   LDLCALC 50 03/10/2018 1615    Lab Results  Component Value Date   HGBA1C 5.6 01/09/2018   CBG (last 3)  No results for input(s): GLUCAP in the last 72 hours.  Nutrition Note Spoke with pt. Nutrition plan and goals reviewed with pt. Pt is following Step 2 of the Therapeutic Lifestyle Changes diet. Pt wants to eat heart healthy and explore new recipes. Heart healthy eating tips and recipes reviewed (label reading, how to build a healthy plate, portion sizes, eating frequently across the day).  Reviewed ways to alter current recipes with heart healthy swaps with patient today. As pts A1c was borderline pre diabetic, explained the difference between complex and refined carbohydrates. Recommended pt incorporated more complex carbohydrates in place of refined ones in her diet. Per discussion, pt does not use canned/convenience foods often. Pt does not add salt to food. Pt  does not eat out frequently. Pt shared that she has made many changes since her cardiac event. Avoids fried foods, uses heart healthy oils when cooking, eats lean protein, reads  Nutrition label for sodium content. Praised pt for these changes and encouraged her to keep up the great work. Pt expressed understanding of the information reviewed. Pt aware of nutrition education classes offered and would like to attend nutrition classes.  Nutrition Diagnosis ? Food-and nutrition-related knowledge deficit related to lack of exposure to information as related to diagnosis of: ? CVD   Nutrition Intervention ? Pt's individual nutrition plan and goals reviewed with pt. ? Pt given handouts for: ? Nutrition I  class ? Nutrition II class  ? Diabetes Blitz Class ? Diabetes Q & A class  ? Consistent vit K diet ? low sodium ? DM ? pre-diabetes  Nutrition Goal(s):   ? Pt to identify and limit food sources of saturated fat, trans fat, refined carbohydrates and sodium ? Pt able to name foods that affect blood glucose.  Plan:  ? Pt to attend nutrition classes ? Nutrition I ? Nutrition II ? Portion Distortion  ? Will provide client-centered nutrition education as part of interdisciplinary care ? Monitor and evaluate progress toward nutrition goal with team.   Ross Marcus, MS, RD, LDN 04/07/2018 10:41 AM

## 2018-04-10 ENCOUNTER — Encounter (HOSPITAL_COMMUNITY): Payer: BLUE CROSS/BLUE SHIELD

## 2018-04-12 ENCOUNTER — Encounter (HOSPITAL_COMMUNITY): Payer: Self-pay

## 2018-04-12 ENCOUNTER — Encounter (HOSPITAL_COMMUNITY): Payer: BLUE CROSS/BLUE SHIELD

## 2018-04-12 ENCOUNTER — Encounter (HOSPITAL_COMMUNITY)
Admission: RE | Admit: 2018-04-12 | Discharge: 2018-04-12 | Disposition: A | Discharge status: Home / Self Care | Payer: BLUE CROSS/BLUE SHIELD | Source: Ambulatory Visit | Attending: Cardiology | Admitting: Cardiology

## 2018-04-12 DIAGNOSIS — Z951 Presence of aortocoronary bypass graft: Secondary | ICD-10-CM | POA: Diagnosis not present

## 2018-04-12 NOTE — Progress Notes (Signed)
Daily Session Note  Patient Details  Name: Diana [name] MRN: [id number] Date of Birth: [birth year] Referring Provider:     CARDIAC REHAB PHASE II ORIENTATION from 04/06/2018 in [place]  Referring Provider  Tilley/Krasowski      Encounter Date: 04/12/2018  Check In: Session Check In - 04/12/18 0709      Check-In   Supervising physician immediately available to respond to emergencies  [facility] immediately available    Physician(s)  Dr. Maryland Pink     Location  [facility]    Staff Present  Jiles Garter, RN, BSN;Joann Rion, RN, Mosie Epstein, MS,ACSM CEP, Exercise Physiologist    Medication changes reported      No    Fall or balance concerns reported     No    Tobacco Cessation  No Change    Warm-up and Cool-down  Performed as group-led instruction    Resistance Training Performed  No    VAD Patient?  No    PAD/SET Patient?  No      Pain Assessment   Currently in Pain?  No/denies       Capillary Blood Glucose: No results found for this or any previous visit (from the past 24 hour(s)).  Exercise Prescription Changes - 04/12/18 0800      Response to Exercise   Blood Pressure (Admit)  124/68    Blood Pressure (Exercise)  140/82    Blood Pressure (Exit)  120/80    Heart Rate (Admit)  66 bpm    Heart Rate (Exercise)  111 bpm    Heart Rate (Exit)  66 bpm    Rating of Perceived Exertion (Exercise)  12    Perceived Dyspnea (Exercise)  0    Symptoms  None    Comments  Pt oriented to exercise prescription     Duration  Progress to 30 minutes of  aerobic without signs/symptoms of physical distress    Intensity  THRR unchanged      Progression   Progression  Continue to progress workloads to maintain intensity without signs/symptoms of physical distress.    Average METs  2.86      Resistance Training   Training Prescription  No      NuStep   Level  3    Minutes  10    METs  2.4      Arm Ergometer   Level   2    Watts  25    Minutes  10      Track   Laps  12    Minutes  10    METs  3       Social History   Tobacco Use  Smoking Status Never Smoker  Smokeless Tobacco Never Used    Goals Met:  Exercise tolerated well  Goals Unmet:  Not Applicable  Comments: Pt started cardiac rehab today.  Pt tolerated light exercise without difficulty. VSS, telemetry-SR, asymptomatic.  Medication list reconciled. Pt denies barriers to medicaiton compliance.  PSYCHOSOCIAL ASSESSMENT:  PHQ-0. Pt exhibits positive coping skills, hopeful outlook with supportive family. No psychosocial needs identified at this time, no psychosocial interventions necessary.   Pt oriented to exercise equipment and routine.    Understanding verbalized.    Dr. Fransico Him is Medical Director for Cardiac Rehab at [facility].

## 2018-04-14 ENCOUNTER — Encounter (HOSPITAL_COMMUNITY): Payer: BLUE CROSS/BLUE SHIELD

## 2018-04-14 ENCOUNTER — Encounter (HOSPITAL_COMMUNITY)
Admission: RE | Admit: 2018-04-14 | Discharge: 2018-04-14 | Disposition: A | Discharge status: Home / Self Care | Payer: BLUE CROSS/BLUE SHIELD | Source: Ambulatory Visit | Attending: Cardiology | Admitting: Cardiology

## 2018-04-14 DIAGNOSIS — Z951 Presence of aortocoronary bypass graft: Secondary | ICD-10-CM

## 2018-04-17 ENCOUNTER — Encounter (HOSPITAL_COMMUNITY)
Admission: RE | Admit: 2018-04-17 | Discharge: 2018-04-17 | Disposition: A | Discharge status: Home / Self Care | Payer: BLUE CROSS/BLUE SHIELD | Source: Ambulatory Visit | Attending: Cardiology | Admitting: Cardiology

## 2018-04-17 ENCOUNTER — Encounter (HOSPITAL_COMMUNITY): Payer: BLUE CROSS/BLUE SHIELD

## 2018-04-17 ENCOUNTER — Encounter (HOSPITAL_COMMUNITY): Payer: Self-pay

## 2018-04-17 DIAGNOSIS — Z951 Presence of aortocoronary bypass graft: Secondary | ICD-10-CM

## 2018-04-19 ENCOUNTER — Encounter (HOSPITAL_COMMUNITY): Payer: BLUE CROSS/BLUE SHIELD

## 2018-04-19 ENCOUNTER — Encounter (HOSPITAL_COMMUNITY)
Admission: RE | Admit: 2018-04-19 | Discharge: 2018-04-19 | Disposition: A | Discharge status: Home / Self Care | Payer: BLUE CROSS/BLUE SHIELD | Source: Ambulatory Visit | Attending: Cardiology | Admitting: Cardiology

## 2018-04-19 DIAGNOSIS — Z951 Presence of aortocoronary bypass graft: Secondary | ICD-10-CM

## 2018-04-20 NOTE — Progress Notes (Signed)
Cardiac Individual Treatment Plan  Patient Details  Name: Diana [name] MRN: [id number] Date of Birth: 03/23/1956 Referring Provider:     CARDIAC REHAB PHASE II ORIENTATION from 04/06/2018 in [facility] CARDIAC [facility]  Referring Provider  Tilley/Krasowski      Initial Encounter Date:    CARDIAC REHAB PHASE II ORIENTATION from 04/06/2018 in [facility] CARDIAC REHAB  Date  04/06/18      Visit Diagnosis: S/P CABG x 4  Patient's Home Medications on Admission:  Current Outpatient Medications:  .  ALPRAZolam (XANAX) 0.5 MG tablet, Take 1 tablet (0.5 mg total) by mouth 2 (two) times daily as needed for anxiety. (Patient not taking: Reported on 04/06/2018), Disp: 20 tablet, Rfl: 0 .  aspirin EC 325 MG EC tablet, Take 1 tablet (325 mg total) by mouth daily., Disp: 30 tablet, Rfl: 0 .  atorvastatin (LIPITOR) 80 MG tablet, Take 0.5 tablets (40 mg total) by mouth daily at 6 PM., Disp: 60 tablet, Rfl: 1 .  cyclobenzaprine (FLEXERIL) 10 MG tablet, TAKE 1/2 TO 1 TABLET(5 TO 10 MG) BY MOUTH THREE TIMES DAILY AS NEEDED FOR MUSCLE SPASMS, Disp: 30 tablet, Rfl: 0 .  diclofenac (VOLTAREN) 75 MG EC tablet, Take 1 tablet (75 mg total) by mouth 2 (two) times daily. (Patient not taking: Reported on 04/06/2018), Disp: 30 tablet, Rfl: 0 .  Metoprolol Tartrate 75 MG TABS, Take 75 mg by mouth every 12 (twelve) hours., Disp: 60 tablet, Rfl: 1 .  omeprazole (PRILOSEC) 20 MG capsule, Take 1 capsule (20 mg total) by mouth daily., Disp: 90 capsule, Rfl: 1 .  pregabalin (LYRICA) 150 MG capsule, Take 150 mg by mouth 2 (two) times daily., Disp: , Rfl:  .  pregabalin (LYRICA) 75 MG capsule, Take 1 capsule (75 mg total) by mouth 2 (two) times daily., Disp: 60 capsule, Rfl: 1 .  traZODone (DESYREL) 50 MG tablet, Take 25-50 mg by mouth at bedtime as needed for sleep., Disp: , Rfl:   Past Medical History: Past Medical History:  Diagnosis Date  . Allergy   . Arthritis    feet  . Bronchitis  10/30/2016  . Carpal tunnel syndrome   . Coronary artery disease   . GERD (gastroesophageal reflux disease)   . Hyperlipidemia   . Hypertension   . Non-ST elevation (NSTEMI) myocardial infarction (HCC) 10/30/2016  . NSTEMI (non-ST elevated myocardial infarction) (HCC) 10/30/2016    Tobacco Use: Social History   Tobacco Use  Smoking Status Never Smoker  Smokeless Tobacco Never Used    Labs: Recent Review Flowsheet Data    Labs for ITP Cardiac and Pulmonary Rehab Latest Ref Rng & Units 01/10/2018 01/10/2018 01/10/2018 01/11/2018 03/10/2018   Cholestrol 100 - 199 mg/dL - - - - 696   LDLCALC 0 - 99 mg/dL - - - - 50   HDL >29 mg/dL - - - - 44   Trlycerides 0 - 149 mg/dL - - - - 528(U)   Hemoglobin A1c 4.8 - 5.6 % - - - - -   PHART 7.350 - 7.450 7.337(L) 7.431 - - -   PCO2ART 32.0 - 48.0 mmHg 47.2 36.1 - - -   HCO3 20.0 - 28.0 mmol/L 25.5 24.1 - - -   TCO2 22 - 32 mmol/L 27 25 24 26  -   ACIDBASEDEF 0.0 - 2.0 mmol/L 1.0 - - - -   O2SAT % 99.0 97.0 - - -      Capillary Blood Glucose: Lab Results  Component Value Date   GLUCAP 70 01/18/2018   GLUCAP 110 (H) 01/14/2018   GLUCAP 94 01/13/2018   GLUCAP 109 (H) 01/13/2018   GLUCAP 96 01/13/2018     Exercise Target Goals: Exercise Program Goal: Individual exercise prescription set using results from initial 6 min walk test and THRR while considering  patient's activity barriers and safety.   Exercise Prescription Goal: Initial exercise prescription builds to 30-45 minutes a day of aerobic activity, 2-3 days per week.  Home exercise guidelines will be given to patient during program as part of exercise prescription that the participant will acknowledge.  Activity Barriers & Risk Stratification: Activity Barriers & Cardiac Risk Stratification - 04/06/18 1624      Activity Barriers & Cardiac Risk Stratification   Activity Barriers  Muscular Weakness;Incisional Pain;Deconditioning   Right leg pain   Cardiac Risk Stratification  High        6 Minute Walk: 6 Minute Walk    Row Name 04/06/18 1604         6 Minute Walk   Phase  Initial     Distance  1561 feet     Walk Time  6 minutes     MPH  2.96     METS  3.61     RPE  11     Perceived Dyspnea   0     VO2 Peak  12.63     Symptoms  Yes (comment)     Comments  Right leg pain     Resting HR  73 bpm     Resting BP  112/68     Resting Oxygen Saturation   99 %     Exercise Oxygen Saturation  during 6 min walk  99 %     Max Ex. HR  76 bpm     Max Ex. BP  120/72     2 Minute Post BP  118/68        Oxygen Initial Assessment:   Oxygen Re-Evaluation:   Oxygen Discharge (Final Oxygen Re-Evaluation):   Initial Exercise Prescription: Initial Exercise Prescription - 04/06/18 1500      Date of Initial Exercise RX and Referring Provider   Date  04/06/18    Referring Provider  Tilley/Krasowski    Expected Discharge Date  07/14/17      NuStep   Level  3    Minutes  10    METs  3.4      Arm Ergometer   Level  2    Watts  30    Minutes  10      Track   Laps  12    Minutes  10    METs  3      Prescription Details   Frequency (times per week)  3    Duration  Progress to 30 minutes of continuous aerobic without signs/symptoms of physical distress      Intensity   THRR 40-80% of Max Heartrate  64-128    Ratings of Perceived Exertion  11-13      Progression   Progression  Continue to progress workloads to maintain intensity without signs/symptoms of physical distress.      Resistance Training   Training Prescription  Yes    Weight  3lb    Reps  10-15       Perform Capillary Blood Glucose checks as needed.  Exercise Prescription Changes: Exercise Prescription Changes    Row Name 04/12/18 0800  Response to Exercise   Blood Pressure (Admit)  124/68       Blood Pressure (Exercise)  140/82       Blood Pressure (Exit)  120/80       Heart Rate (Admit)  66 bpm       Heart Rate (Exercise)  111 bpm       Heart Rate (Exit)  66  bpm       Rating of Perceived Exertion (Exercise)  12       Perceived Dyspnea (Exercise)  0       Symptoms  None       Comments  Pt oriented to exercise prescription        Duration  Progress to 30 minutes of  aerobic without signs/symptoms of physical distress       Intensity  THRR unchanged         Progression   Progression  Continue to progress workloads to maintain intensity without signs/symptoms of physical distress.       Average METs  2.86         Resistance Training   Training Prescription  No         NuStep   Level  3       Minutes  10       METs  2.4         Arm Ergometer   Level  2       Watts  25       Minutes  10         Track   Laps  12       Minutes  10       METs  3          Exercise Comments: Exercise Comments    Row Name 04/12/18 0803           Exercise Comments  Pt's first day of Cardiac Rehab. Pt responded to exercise workloads. Will continue to monitor and progress pt as tolerated.           Exercise Goals and Review: Exercise Goals    Row Name 04/06/18 1607             Exercise Goals   Increase Physical Activity  Yes       Intervention  Develop an individualized exercise prescription for aerobic and resistive training based on initial evaluation findings, risk stratification, comorbidities and participant's personal goals.;Provide advice, education, support and counseling about physical activity/exercise needs.       Expected Outcomes  Short Term: Attend rehab on a regular basis to increase amount of physical activity.       Intervention  Provide advice, education, support and counseling about physical activity/exercise needs.;Develop an individualized exercise prescription for aerobic and resistive training based on initial evaluation findings, risk stratification, comorbidities and participant's personal goals.       Expected Outcomes  Short Term: Increase workloads from initial exercise prescription for resistance, speed, and METs.        Able to understand and use rate of perceived exertion (RPE) scale  Yes       Intervention  Provide education and explanation on how to use RPE scale       Expected Outcomes  Short Term: Able to use RPE daily in rehab to express subjective intensity level;Long Term:  Able to use RPE to guide intensity level when exercising independently       Knowledge and understanding of Target Heart  Rate Range (THRR)  Yes       Intervention  Provide education and explanation of THRR including how the numbers were predicted and where they are located for reference       Expected Outcomes  Short Term: Able to state/look up THRR       Able to check pulse independently  Yes       Intervention  Provide education and demonstration on how to check pulse in carotid and radial arteries.;Review the importance of being able to check your own pulse for safety during independent exercise       Expected Outcomes  Short Term: Able to explain why pulse checking is important during independent exercise;Long Term: Able to check pulse independently and accurately       Understanding of Exercise Prescription  Yes       Intervention  Provide education, explanation, and written materials on patient's individual exercise prescription       Expected Outcomes  Short Term: Able to explain program exercise prescription;Long Term: Able to explain home exercise prescription to exercise independently          Exercise Goals Re-Evaluation :   Discharge Exercise Prescription (Final Exercise Prescription Changes): Exercise Prescription Changes - 04/12/18 0800      Response to Exercise   Blood Pressure (Admit)  124/68    Blood Pressure (Exercise)  140/82    Blood Pressure (Exit)  120/80    Heart Rate (Admit)  66 bpm    Heart Rate (Exercise)  111 bpm    Heart Rate (Exit)  66 bpm    Rating of Perceived Exertion (Exercise)  12    Perceived Dyspnea (Exercise)  0    Symptoms  None    Comments  Pt oriented to exercise prescription      Duration  Progress to 30 minutes of  aerobic without signs/symptoms of physical distress    Intensity  THRR unchanged      Progression   Progression  Continue to progress workloads to maintain intensity without signs/symptoms of physical distress.    Average METs  2.86      Resistance Training   Training Prescription  No      NuStep   Level  3    Minutes  10    METs  2.4      Arm Ergometer   Level  2    Watts  25    Minutes  10      Track   Laps  12    Minutes  10    METs  3       Nutrition:  Target Goals: Understanding of nutrition guidelines, daily intake of sodium 1500mg , cholesterol 200mg , calories 30% from fat and 7% or less from saturated fats, daily to have 5 or more servings of fruits and vegetables.  Biometrics: Pre Biometrics - 04/06/18 1602      Pre Biometrics   Height  5\' 2"  (1.575 m)    Weight  63.2 kg    Waist Circumference  36 inches    Hip Circumference  39 inches    Waist to Hip Ratio  0.92 %    BMI (Calculated)  25.48    Triceps Skinfold  30 mm    % Body Fat  38.3 %    Grip Strength  25 kg    Flexibility  13 in    Single Leg Stand  3.03 seconds        Nutrition Therapy Plan  and Nutrition Goals: Nutrition Therapy & Goals - 04/07/18 1046      Nutrition Therapy   Diet  heart healthy      Personal Nutrition Goals   Nutrition Goal  Pt to identify and limit food sources of saturated fat, trans fat, refined carbohydrates and sodium    Personal Goal #2  Pt able to name foods that affect blood glucose      Intervention Plan   Intervention  Prescribe, educate and counsel regarding individualized specific dietary modifications aiming towards targeted core components such as weight, hypertension, lipid management, diabetes, heart failure and other comorbidities.    Expected Outcomes  Short Term Goal: Understand basic principles of dietary content, such as calories, fat, sodium, cholesterol and nutrients.;Long Term Goal: Adherence to prescribed  nutrition plan.       Nutrition Assessments: Nutrition Assessments - 04/07/18 1055      MEDFICTS Scores   Pre Score  24       Nutrition Goals Re-Evaluation: Nutrition Goals Re-Evaluation    Row Name 04/07/18 1046             Goals   Current Weight  139 lb 5.3 oz (63.2 kg)          Nutrition Goals Re-Evaluation: Nutrition Goals Re-Evaluation    Row Name 04/07/18 1046             Goals   Current Weight  139 lb 5.3 oz (63.2 kg)          Nutrition Goals Discharge (Final Nutrition Goals Re-Evaluation): Nutrition Goals Re-Evaluation - 04/07/18 1046      Goals   Current Weight  139 lb 5.3 oz (63.2 kg)       Psychosocial: Target Goals: Acknowledge presence or absence of significant depression and/or stress, maximize coping skills, provide positive support system. Participant is able to verbalize types and ability to use techniques and skills needed for reducing stress and depression.  Initial Review & Psychosocial Screening: Initial Psych Review & Screening - 04/06/18 1615      Initial Review   Current issues with  Current Stress Concerns    Source of Stress Concerns  Occupation;Financial    Comments  Rinaldo Cloudamela is on leave from work.  She had to move in with her mother due to loss of income.       Family Dynamics   Good Support System?  Yes   Her mother is a source of support for her.      Barriers   Psychosocial barriers to participate in program  The patient should benefit from training in stress management and relaxation.      Screening Interventions   Interventions  Encouraged to exercise    Expected Outcomes  Short Term goal: Utilizing psychosocial counselor, staff and physician to assist with identification of specific Stressors or current issues interfering with healing process. Setting desired goal for each stressor or current issue identified.;Long Term Goal: Stressors or current issues are controlled or eliminated.       Quality of Life  Scores: Quality of Life - 04/06/18 1616      Quality of Life   Select  Quality of Life      Quality of Life Scores   Health/Function Pre  24.57 %    Socioeconomic Pre  22.69 %    Psych/Spiritual Pre  25.07 %    Family Pre  27 %    GLOBAL Pre  24.59 %      Scores of  19 and below usually indicate a poorer quality of life in these areas.  A difference of  2-3 points is a clinically meaningful difference.  A difference of 2-3 points in the total score of the Quality of Life Index has been associated with significant improvement in overall quality of life, self-image, physical symptoms, and general health in studies assessing change in quality of life.  PHQ-9: Recent Review Flowsheet Data    Depression screen [facility] 2/9 04/12/2018 01/02/2018 12/15/2017 11/14/2017 11/04/2017   Decreased Interest 0 0 0 0 0   Down, Depressed, Hopeless 0 0 0 0 0   PHQ - 2 Score 0 0 0 0 0     Interpretation of Total Score  Total Score Depression Severity:  1-4 = Minimal depression, 5-9 = Mild depression, 10-14 = Moderate depression, 15-19 = Moderately severe depression, 20-27 = Severe depression   Psychosocial Evaluation and Intervention: Psychosocial Evaluation - 04/12/18 0829      Psychosocial Evaluation & Interventions   Interventions  Encouraged to exercise with the program and follow exercise prescription;Stress management education;Relaxation education    Comments  Vocational Rehab paperwork given to patient. No other psychosocial interventions warranted at this time. Pt enjoys bowling, watching movies, and skating.     Expected Outcomes  Pam will report reduction in her stress levels.     Continue Psychosocial Services   Follow up required by staff       Psychosocial Re-Evaluation: Psychosocial Re-Evaluation    Row Name 04/17/18 [phone]             Psychosocial Re-Evaluation   Current issues with  Current Stress Concerns       Comments  Vocational Rehab form given to patient.  She plans to bring it  back soon.       Expected Outcomes  Shuronda will report reduced stress levels.        Interventions  Relaxation education;Stress management education;Encouraged to attend Cardiac Rehabilitation for the exercise       Continue Psychosocial Services   Follow up required by staff          Psychosocial Discharge (Final Psychosocial Re-Evaluation): Psychosocial Re-Evaluation - 04/17/18 0937      Psychosocial Re-Evaluation   Current issues with  Current Stress Concerns    Comments  Vocational Rehab form given to patient.  She plans to bring it back soon.    Expected Outcomes  Emilia will report reduced stress levels.     Interventions  Relaxation education;Stress management education;Encouraged to attend Cardiac Rehabilitation for the exercise    Continue Psychosocial Services   Follow up required by staff       Vocational Rehabilitation: Provide vocational rehab assistance to qualifying candidates.   Vocational Rehab Evaluation & Intervention: Vocational Rehab - 04/12/18 0830      Vocational Rehab Re-Evaulation   Comments  Packet given to patient.        Education: Education Goals: Education classes will be provided on a weekly basis, covering required topics. Participant will state understanding/return demonstration of topics presented.  Learning Barriers/Preferences: Learning Barriers/Preferences - 04/06/18 1610      Learning Barriers/Preferences   Learning Barriers  None    Learning Preferences  Skilled Demonstration;Pictoral;Video       Education Topics: Count Your Pulse:  -Group instruction provided by verbal instruction, demonstration, patient participation and written materials to support subject.  Instructors address importance of being able to find your pulse and how to count your pulse when at home  without a heart monitor.  Patients get hands on experience counting their pulse with staff help and individually.   Heart Attack, Angina, and Risk Factor Modification:   -Group instruction provided by verbal instruction, video, and written materials to support subject.  Instructors address signs and symptoms of angina and heart attacks.    Also discuss risk factors for heart disease and how to make changes to improve heart health risk factors.   Functional Fitness:  -Group instruction provided by verbal instruction, demonstration, patient participation, and written materials to support subject.  Instructors address safety measures for doing things around the house.  Discuss how to get up and down off the floor, how to pick things up properly, how to safely get out of a chair without assistance, and balance training.   CARDIAC REHAB PHASE II EXERCISE from 04/19/2018 in [facility] CARDIAC REHAB  Date  04/14/18  Instruction Review Code  2- Demonstrated Understanding      Meditation and Mindfulness:  -Group instruction provided by verbal instruction, patient participation, and written materials to support subject.  Instructor addresses importance of mindfulness and meditation practice to help reduce stress and improve awareness.  Instructor also leads participants through a meditation exercise.    Stretching for Flexibility and Mobility:  -Group instruction provided by verbal instruction, patient participation, and written materials to support subject.  Instructors lead participants through series of stretches that are designed to increase flexibility thus improving mobility.  These stretches are additional exercise for major muscle groups that are typically performed during regular warm up and cool down.   Hands Only CPR:  -Group verbal, video, and participation provides a basic overview of AHA guidelines for community CPR. Role-play of emergencies allow participants the opportunity to practice calling for help and chest compression technique with discussion of AED use.   Hypertension: -Group verbal and written instruction that provides a  basic overview of hypertension including the most recent diagnostic guidelines, risk factor reduction with self-care instructions and medication management.   CARDIAC REHAB PHASE II EXERCISE from 04/19/2018 in [facility] CARDIAC REHAB  Date  04/19/18  Educator  RN  Instruction Review Code  2- Demonstrated Understanding       Nutrition I class: Heart Healthy Eating:  -Group instruction provided by PowerPoint slides, verbal discussion, and written materials to support subject matter. The instructor gives an explanation and review of the Therapeutic Lifestyle Changes diet recommendations, which includes a discussion on lipid goals, dietary fat, sodium, fiber, plant stanol/sterol esters, sugar, and the components of a well-balanced, healthy diet.   Nutrition II class: Lifestyle Skills:  -Group instruction provided by PowerPoint slides, verbal discussion, and written materials to support subject matter. The instructor gives an explanation and review of label reading, grocery shopping for heart health, heart healthy recipe modifications, and ways to make healthier choices when eating out.   Diabetes Question & Answer:  -Group instruction provided by PowerPoint slides, verbal discussion, and written materials to support subject matter. The instructor gives an explanation and review of diabetes co-morbidities, pre- and post-prandial blood glucose goals, pre-exercise blood glucose goals, signs, symptoms, and treatment of hypoglycemia and hyperglycemia, and foot care basics.   Diabetes Blitz:  -Group instruction provided by PowerPoint slides, verbal discussion, and written materials to support subject matter. The instructor gives an explanation and review of the physiology behind type 1 and type 2 diabetes, diabetes medications and rational behind using different medications, pre- and post-prandial blood glucose recommendations and Hemoglobin  A1c goals, diabetes diet, and exercise  including blood glucose guidelines for exercising safely.    Portion Distortion:  -Group instruction provided by PowerPoint slides, verbal discussion, written materials, and food models to support subject matter. The instructor gives an explanation of serving size versus portion size, changes in portions sizes over the last 20 years, and what consists of a serving from each food group.   Stress Management:  -Group instruction provided by verbal instruction, video, and written materials to support subject matter.  Instructors review role of stress in heart disease and how to cope with stress positively.     Exercising on Your Own:  -Group instruction provided by verbal instruction, power point, and written materials to support subject.  Instructors discuss benefits of exercise, components of exercise, frequency and intensity of exercise, and end points for exercise.  Also discuss use of nitroglycerin and activating EMS.  Review options of places to exercise outside of rehab.  Review guidelines for sex with heart disease.   Cardiac Drugs I:  -Group instruction provided by verbal instruction and written materials to support subject.  Instructor reviews cardiac drug classes: antiplatelets, anticoagulants, beta blockers, and statins.  Instructor discusses reasons, side effects, and lifestyle considerations for each drug class.   CARDIAC REHAB PHASE II EXERCISE from 04/19/2018 in [facility] CARDIAC REHAB  Date  04/12/18  Educator  Pharmacist  Instruction Review Code  2- Demonstrated Understanding      Cardiac Drugs II:  -Group instruction provided by verbal instruction and written materials to support subject.  Instructor reviews cardiac drug classes: angiotensin converting enzyme inhibitors (ACE-I), angiotensin II receptor blockers (ARBs), nitrates, and calcium channel blockers.  Instructor discusses reasons, side effects, and lifestyle considerations for each drug  class.   Anatomy and Physiology of the Circulatory System:  Group verbal and written instruction and models provide basic cardiac anatomy and physiology, with the coronary electrical and arterial systems. Review of: AMI, Angina, Valve disease, Heart Failure, Peripheral Artery Disease, Cardiac Arrhythmia, Pacemakers, and the ICD.   Other Education:  -Group or individual verbal, written, or video instructions that support the educational goals of the cardiac rehab program.   Holiday Eating Survival Tips:  -Group instruction provided by PowerPoint slides, verbal discussion, and written materials to support subject matter. The instructor gives patients tips, tricks, and techniques to help them not only survive but enjoy the holidays despite the onslaught of food that accompanies the holidays.   Knowledge Questionnaire Score: Knowledge Questionnaire Score - 04/06/18 1616      Knowledge Questionnaire Score   Pre Score  17/24       Core Components/Risk Factors/Patient Goals at Admission: Personal Goals and Risk Factors at Admission - 04/06/18 1608      Core Components/Risk Factors/Patient Goals on Admission    Weight Management  Weight Maintenance;Yes    Intervention  Weight Management: Develop a combined nutrition and exercise program designed to reach desired caloric intake, while maintaining appropriate intake of nutrient and fiber, sodium and fats, and appropriate energy expenditure required for the weight goal.;Weight Management: Provide education and appropriate resources to help participant work on and attain dietary goals.    Admit Weight  137 lb 5.6 oz (62.3 kg)    Expected Outcomes  Weight Maintenance: Understanding of the daily nutrition guidelines, which includes 25-35% calories from fat, 7% or less cal from saturated fats, less than 200mg  cholesterol, less than 1.5gm of sodium, & 5 or more servings of fruits and vegetables daily;Short  Term: Continue to assess and modify  interventions until short term weight is achieved;Long Term: Adherence to nutrition and physical activity/exercise program aimed toward attainment of established weight goal;Understanding recommendations for meals to include 15-35% energy as protein, 25-35% energy from fat, 35-60% energy from carbohydrates, less than 200mg  of dietary cholesterol, 20-35 gm of total fiber daily;Understanding of distribution of calorie intake throughout the day with the consumption of 4-5 meals/snacks    Hypertension  Yes    Intervention  Provide education on lifestyle modifcations including regular physical activity/exercise, weight management, moderate sodium restriction and increased consumption of fresh fruit, vegetables, and low fat dairy, alcohol moderation, and smoking cessation.;Monitor prescription use compliance.    Expected Outcomes  Short Term: Continued assessment and intervention until BP is < 140/30mm HG in hypertensive participants. < 130/81mm HG in hypertensive participants with diabetes, heart failure or chronic kidney disease.;Long Term: Maintenance of blood pressure at goal levels.    Lipids  Yes    Intervention  Provide education and support for participant on nutrition & aerobic/resistive exercise along with prescribed medications to achieve LDL 70mg , HDL >40mg .    Expected Outcomes  Short Term: Participant states understanding of desired cholesterol values and is compliant with medications prescribed. Participant is following exercise prescription and nutrition guidelines.;Long Term: Cholesterol controlled with medications as prescribed, with individualized exercise RX and with personalized nutrition plan. Value goals: LDL < 70mg , HDL > 40 mg.    Stress  Yes    Intervention  Offer individual and/or small group education and counseling on adjustment to heart disease, stress management and health-related lifestyle change. Teach and support self-help strategies.;Refer participants experiencing significant  psychosocial distress to appropriate mental health specialists for further evaluation and treatment. When possible, include family members and significant others in education/counseling sessions.       Core Components/Risk Factors/Patient Goals Review:  Goals and Risk Factor Review    Row Name 04/12/18 0833 04/17/18 0940           Core Components/Risk Factors/Patient Goals Review   Personal Goals Review  Stress;Hypertension;Lipids;Weight Management/Obesity  Stress;Hypertension;Lipids;Weight Management/Obesity      Review  Pt willing to participate in CR exercise. Pt would like to increase her strength.   Pt with multiple CAD RFs.  She started exercise recently and is tolerating it well. VSS.      Expected Outcomes  Pt will continue to participate in CR exercise, nutrition, and lifestyle modification.   Pt will continue to participate in CR exercise, nutrition, and lifestyle modification.          Core Components/Risk Factors/Patient Goals at Discharge (Final Review):  Goals and Risk Factor Review - 04/17/18 0940      Core Components/Risk Factors/Patient Goals Review   Personal Goals Review  Stress;Hypertension;Lipids;Weight Management/Obesity    Review  Pt with multiple CAD RFs.  She started exercise recently and is tolerating it well. VSS.    Expected Outcomes  Pt will continue to participate in CR exercise, nutrition, and lifestyle modification.        ITP Comments: ITP Comments    Row Name 04/06/18 1612 04/17/18 0930         ITP Comments  Dr. Armanda Magic Medical Director  30 Day ITP Review. Pt has started exercise recently and is tolerating it well.          Comments: See ITP Comments.

## 2018-04-21 ENCOUNTER — Encounter (HOSPITAL_COMMUNITY)
Admission: RE | Admit: 2018-04-21 | Discharge: 2018-04-21 | Disposition: A | Discharge status: Home / Self Care | Payer: BLUE CROSS/BLUE SHIELD | Source: Ambulatory Visit | Attending: Cardiology | Admitting: Cardiology

## 2018-04-21 ENCOUNTER — Encounter (HOSPITAL_COMMUNITY): Payer: BLUE CROSS/BLUE SHIELD

## 2018-04-21 DIAGNOSIS — Z951 Presence of aortocoronary bypass graft: Secondary | ICD-10-CM | POA: Diagnosis not present

## 2018-04-24 ENCOUNTER — Encounter (HOSPITAL_COMMUNITY): Payer: BLUE CROSS/BLUE SHIELD

## 2018-04-24 ENCOUNTER — Encounter (HOSPITAL_COMMUNITY)
Admission: RE | Admit: 2018-04-24 | Discharge: 2018-04-24 | Disposition: A | Discharge status: Home / Self Care | Payer: BLUE CROSS/BLUE SHIELD | Source: Ambulatory Visit | Attending: Cardiology | Admitting: Cardiology

## 2018-04-24 DIAGNOSIS — Z951 Presence of aortocoronary bypass graft: Secondary | ICD-10-CM

## 2018-04-25 ENCOUNTER — Telehealth: Payer: Self-pay | Admitting: Cardiology

## 2018-04-25 NOTE — Telephone Encounter (Signed)
Patient informed that due to discomfort patient should postpone mammogram until patient is more comfortable per Dr. Bing MatterKrasowski.Patient verbally understands

## 2018-04-25 NOTE — Telephone Encounter (Signed)
Left message for patient to return call.

## 2018-04-25 NOTE — Telephone Encounter (Signed)
Patient is asking if she should keep her mammogram appt

## 2018-04-26 ENCOUNTER — Encounter (HOSPITAL_COMMUNITY)
Admission: RE | Admit: 2018-04-26 | Discharge: 2018-04-26 | Disposition: A | Discharge status: Home / Self Care | Payer: BLUE CROSS/BLUE SHIELD | Source: Ambulatory Visit | Attending: Cardiology | Admitting: Cardiology

## 2018-04-26 ENCOUNTER — Encounter (HOSPITAL_COMMUNITY): Payer: BLUE CROSS/BLUE SHIELD

## 2018-04-26 DIAGNOSIS — Z951 Presence of aortocoronary bypass graft: Secondary | ICD-10-CM

## 2018-04-28 ENCOUNTER — Encounter (HOSPITAL_COMMUNITY): Payer: BLUE CROSS/BLUE SHIELD

## 2018-05-01 ENCOUNTER — Encounter (HOSPITAL_COMMUNITY)
Admission: RE | Admit: 2018-05-01 | Discharge: 2018-05-01 | Disposition: A | Discharge status: Home / Self Care | Payer: BLUE CROSS/BLUE SHIELD | Source: Ambulatory Visit | Attending: Cardiology | Admitting: Cardiology

## 2018-05-01 ENCOUNTER — Encounter (HOSPITAL_COMMUNITY): Payer: BLUE CROSS/BLUE SHIELD

## 2018-05-01 DIAGNOSIS — Z951 Presence of aortocoronary bypass graft: Secondary | ICD-10-CM | POA: Diagnosis not present

## 2018-05-03 ENCOUNTER — Encounter (HOSPITAL_COMMUNITY): Payer: BLUE CROSS/BLUE SHIELD

## 2018-05-03 ENCOUNTER — Encounter (HOSPITAL_COMMUNITY)
Admission: RE | Admit: 2018-05-03 | Discharge: 2018-05-03 | Disposition: A | Discharge status: Home / Self Care | Payer: BLUE CROSS/BLUE SHIELD | Source: Ambulatory Visit | Attending: Cardiology | Admitting: Cardiology

## 2018-05-03 DIAGNOSIS — Z951 Presence of aortocoronary bypass graft: Secondary | ICD-10-CM

## 2018-05-05 ENCOUNTER — Encounter (HOSPITAL_COMMUNITY): Payer: BLUE CROSS/BLUE SHIELD

## 2018-05-05 ENCOUNTER — Encounter (HOSPITAL_COMMUNITY)
Admission: RE | Admit: 2018-05-05 | Discharge: 2018-05-05 | Disposition: A | Discharge status: Home / Self Care | Payer: BLUE CROSS/BLUE SHIELD | Source: Ambulatory Visit | Attending: Cardiology | Admitting: Cardiology

## 2018-05-05 DIAGNOSIS — Z951 Presence of aortocoronary bypass graft: Secondary | ICD-10-CM

## 2018-05-08 ENCOUNTER — Encounter (HOSPITAL_COMMUNITY): Payer: BLUE CROSS/BLUE SHIELD

## 2018-05-10 ENCOUNTER — Telehealth (HOSPITAL_COMMUNITY): Payer: Self-pay | Admitting: *Deleted

## 2018-05-10 ENCOUNTER — Encounter (HOSPITAL_COMMUNITY): Payer: BLUE CROSS/BLUE SHIELD

## 2018-05-10 ENCOUNTER — Encounter (HOSPITAL_COMMUNITY): Payer: Self-pay

## 2018-05-10 NOTE — Telephone Encounter (Signed)
Received message from pt to call her back.  Returned phone call to pt.  Pt is having transportation issues and therefore has been absent from CR this week.  She is hopeful that her transportation issues will be resolved and that she will be in attendance for CR on Monday, 05/15/18.  Emotional support given to patient.

## 2018-05-11 NOTE — Progress Notes (Signed)
Cardiac Individual Treatment Plan  Patient Details  Name: Diana [name] MRN: [id number] Date of Birth: [birth year] Referring Provider:     CARDIAC REHAB PHASE II ORIENTATION from 04/06/2018 in [facility] CARDIAC [facility] [place]  Referring Provider  Tilley/Krasowski      Initial Encounter Date:    CARDIAC REHAB PHASE II ORIENTATION from 04/06/2018 in [facility] CARDIAC REHAB  Date  04/06/18      Visit Diagnosis: S/P CABG x 4  Patient's Home Medications on Admission:  Current Outpatient Medications:  .  ALPRAZolam (XANAX) 0.5 MG tablet, Take 1 tablet (0.5 mg total) by mouth 2 (two) times daily as needed for anxiety. (Patient not taking: Reported on 04/06/2018), Disp: 20 tablet, Rfl: 0 .  aspirin EC 325 MG EC tablet, Take 1 tablet (325 mg total) by mouth daily., Disp: 30 tablet, Rfl: 0 .  atorvastatin (LIPITOR) 80 MG tablet, Take 0.5 tablets (40 mg total) by mouth daily at 6 PM., Disp: 60 tablet, Rfl: 1 .  cyclobenzaprine (FLEXERIL) 10 MG tablet, TAKE 1/2 TO 1 TABLET(5 TO 10 MG) BY MOUTH THREE TIMES DAILY AS NEEDED FOR MUSCLE SPASMS, Disp: 30 tablet, Rfl: 0 .  diclofenac (VOLTAREN) 75 MG EC tablet, Take 1 tablet (75 mg total) by mouth 2 (two) times daily. (Patient not taking: Reported on 04/06/2018), Disp: 30 tablet, Rfl: 0 .  Metoprolol Tartrate 75 MG TABS, Take 75 mg by mouth every 12 (twelve) hours., Disp: 60 tablet, Rfl: 1 .  omeprazole (PRILOSEC) 20 MG capsule, Take 1 capsule (20 mg total) by mouth daily., Disp: 90 capsule, Rfl: 1 .  pregabalin (LYRICA) 150 MG capsule, Take 150 mg by mouth 2 (two) times daily., Disp: , Rfl:  .  pregabalin (LYRICA) 75 MG capsule, Take 1 capsule (75 mg total) by mouth 2 (two) times daily., Disp: 60 capsule, Rfl: 1 .  traZODone (DESYREL) 50 MG tablet, Take 25-50 mg by mouth at bedtime as needed for sleep., Disp: , Rfl:   Past Medical History: Past Medical History:  Diagnosis Date  . Allergy   . Arthritis    feet  . Bronchitis  10/30/2016  . Carpal tunnel syndrome   . Coronary artery disease   . GERD (gastroesophageal reflux disease)   . Hyperlipidemia   . Hypertension   . Non-ST elevation (NSTEMI) myocardial infarction (HCC) 10/30/2016  . NSTEMI (non-ST elevated myocardial infarction) (HCC) 10/30/2016    Tobacco Use: Social History   Tobacco Use  Smoking Status Never Smoker  Smokeless Tobacco Never Used    Labs: Recent Review Flowsheet Data    Labs for ITP Cardiac and Pulmonary Rehab Latest Ref Rng & Units 01/10/2018 01/10/2018 01/10/2018 01/11/2018 03/10/2018   Cholestrol 100 - 199 mg/dL - - - - 914   LDLCALC 0 - 99 mg/dL - - - - 50   HDL >78 mg/dL - - - - 44   Trlycerides 0 - 149 mg/dL - - - - 295(A)   Hemoglobin A1c 4.8 - 5.6 % - - - - -   PHART 7.350 - 7.450 7.337(L) 7.431 - - -   PCO2ART 32.0 - 48.0 mmHg 47.2 36.1 - - -   HCO3 20.0 - 28.0 mmol/L 25.5 24.1 - - -   TCO2 22 - 32 mmol/L 27 25 24 26  -   ACIDBASEDEF 0.0 - 2.0 mmol/L 1.0 - - - -   O2SAT % 99.0 97.0 - - -      Capillary Blood Glucose: Lab Results  Component Value Date   GLUCAP 70 01/18/2018   GLUCAP 110 (H) 01/14/2018   GLUCAP 94 01/13/2018   GLUCAP 109 (H) 01/13/2018   GLUCAP 96 01/13/2018     Exercise Target Goals: Exercise Program Goal: Individual exercise prescription set using results from initial 6 min walk test and THRR while considering  patient's activity barriers and safety.   Exercise Prescription Goal: Initial exercise prescription builds to 30-45 minutes a day of aerobic activity, 2-3 days per week.  Home exercise guidelines will be given to patient during program as part of exercise prescription that the participant will acknowledge.  Activity Barriers & Risk Stratification: Activity Barriers & Cardiac Risk Stratification - 04/06/18 1624      Activity Barriers & Cardiac Risk Stratification   Activity Barriers  Muscular Weakness;Incisional Pain;Deconditioning   Right leg pain   Cardiac Risk Stratification  High        6 Minute Walk: 6 Minute Walk    Row Name 04/06/18 1604         6 Minute Walk   Phase  Initial     Distance  1561 feet     Walk Time  6 minutes     MPH  2.96     METS  3.61     RPE  11     Perceived Dyspnea   0     VO2 Peak  12.63     Symptoms  Yes (comment)     Comments  Right leg pain     Resting HR  73 bpm     Resting BP  112/68     Resting Oxygen Saturation   99 %     Exercise Oxygen Saturation  during 6 min walk  99 %     Max Ex. HR  76 bpm     Max Ex. BP  120/72     2 Minute Post BP  118/68        Oxygen Initial Assessment:   Oxygen Re-Evaluation:   Oxygen Discharge (Final Oxygen Re-Evaluation):   Initial Exercise Prescription: Initial Exercise Prescription - 04/06/18 1500      Date of Initial Exercise RX and Referring Provider   Date  04/06/18    Referring Provider  Tilley/Krasowski    Expected Discharge Date  07/14/17      NuStep   Level  3    Minutes  10    METs  3.4      Arm Ergometer   Level  2    Watts  30    Minutes  10      Track   Laps  12    Minutes  10    METs  3      Prescription Details   Frequency (times per week)  3    Duration  Progress to 30 minutes of continuous aerobic without signs/symptoms of physical distress      Intensity   THRR 40-80% of Max Heartrate  64-128    Ratings of Perceived Exertion  11-13      Progression   Progression  Continue to progress workloads to maintain intensity without signs/symptoms of physical distress.      Resistance Training   Training Prescription  Yes    Weight  3lb    Reps  10-15       Perform Capillary Blood Glucose checks as needed.  Exercise Prescription Changes: Exercise Prescription Changes    Row Name 04/12/18 0800 04/26/18 1008 05/01/18 1637 05/05/18  1639       Response to Exercise   Blood Pressure (Admit)  124/68  102/60  112/64  110/60    Blood Pressure (Exercise)  140/82  120/60  138/76  112/60    Blood Pressure (Exit)  120/80  110/74  112/64  102/60     Heart Rate (Admit)  66 bpm  78 bpm  78 bpm  72 bpm    Heart Rate (Exercise)  111 bpm  111 bpm  122 bpm  104 bpm    Heart Rate (Exit)  66 bpm  72 bpm  81 bpm  65 bpm    Rating of Perceived Exertion (Exercise)  12  12  11  13     Perceived Dyspnea (Exercise)  0  0  0  0    Symptoms  None  None  None  None    Comments  Pt oriented to exercise prescription   -  None  None    Duration  Progress to 30 minutes of  aerobic without signs/symptoms of physical distress  Progress to 30 minutes of  aerobic without signs/symptoms of physical distress  Progress to 30 minutes of  aerobic without signs/symptoms of physical distress  Progress to 30 minutes of  aerobic without signs/symptoms of physical distress    Intensity  THRR unchanged  THRR unchanged  THRR unchanged  THRR unchanged      Progression   Progression  Continue to progress workloads to maintain intensity without signs/symptoms of physical distress.  Continue to progress workloads to maintain intensity without signs/symptoms of physical distress.  Continue to progress workloads to maintain intensity without signs/symptoms of physical distress.  Continue to progress workloads to maintain intensity without signs/symptoms of physical distress.    Average METs  2.86  3.55  3.68  3.32      Resistance Training   Training Prescription  No  No  Yes  Yes    Weight  -  -  3lbs  3lbs    Reps  -  -  10-15  10-15    Time  -  -  10 Minutes  10 Minutes      Interval Training   Interval Training  -  -  No  No      NuStep   Level  3  3  3  3     Minutes  10  10  10  10     METs  2.4  3.1  3.9  3.1      Arm Ergometer   Level  2  2  2  2     Watts  25  25  25  25     Minutes  10  10  10  10     METs  -  -  2.3  1.5      Track   Laps  12  12  12  16     Minutes  10  10  10  10     METs  3  3  3   3.76       Exercise Comments: Exercise Comments    Row Name 04/12/18 0803 05/10/18 1641         Exercise Comments  Pt's first day of Cardiac Rehab. Pt  responded to exercise workloads. Will continue to monitor and progress pt as tolerated.   Reviewed METs and goals with pt. Will follow up with pt regarding plans for home exercise program. Will continue to monitor and progress pt  as tolerated.          Exercise Goals and Review: Exercise Goals    Row Name 04/06/18 1607             Exercise Goals   Increase Physical Activity  Yes       Intervention  Develop an individualized exercise prescription for aerobic and resistive training based on initial evaluation findings, risk stratification, comorbidities and participant's personal goals.;Provide advice, education, support and counseling about physical activity/exercise needs.       Expected Outcomes  Short Term: Attend rehab on a regular basis to increase amount of physical activity.       Intervention  Provide advice, education, support and counseling about physical activity/exercise needs.;Develop an individualized exercise prescription for aerobic and resistive training based on initial evaluation findings, risk stratification, comorbidities and participant's personal goals.       Expected Outcomes  Short Term: Increase workloads from initial exercise prescription for resistance, speed, and METs.       Able to understand and use rate of perceived exertion (RPE) scale  Yes       Intervention  Provide education and explanation on how to use RPE scale       Expected Outcomes  Short Term: Able to use RPE daily in rehab to express subjective intensity level;Long Term:  Able to use RPE to guide intensity level when exercising independently       Knowledge and understanding of Target Heart Rate Range (THRR)  Yes       Intervention  Provide education and explanation of THRR including how the numbers were predicted and where they are located for reference       Expected Outcomes  Short Term: Able to state/look up THRR       Able to check pulse independently  Yes       Intervention  Provide education  and demonstration on how to check pulse in carotid and radial arteries.;Review the importance of being able to check your own pulse for safety during independent exercise       Expected Outcomes  Short Term: Able to explain why pulse checking is important during independent exercise;Long Term: Able to check pulse independently and accurately       Understanding of Exercise Prescription  Yes       Intervention  Provide education, explanation, and written materials on patient's individual exercise prescription       Expected Outcomes  Short Term: Able to explain program exercise prescription;Long Term: Able to explain home exercise prescription to exercise independently          Exercise Goals Re-Evaluation : Exercise Goals Re-Evaluation    Row Name 05/10/18 1642             Exercise Goal Re-Evaluation   Exercise Goals Review  Increase Physical Activity;Understanding of Exercise Prescription       Comments  Pt is continuing to respond well to prescription and workloads. Pt has been trouble with transportation, but is still making an effort to return to exercise.        Expected Outcomes  Pt will continue to increase cardiovascular fitness and return to regular activities. Will follow up with pt regarding home exercise plan.           Discharge Exercise Prescription (Final Exercise Prescription Changes): Exercise Prescription Changes - 05/05/18 1639      Response to Exercise   Blood Pressure (Admit)  110/60    Blood Pressure (Exercise)  112/60    Blood Pressure (Exit)  102/60    Heart Rate (Admit)  72 bpm    Heart Rate (Exercise)  104 bpm    Heart Rate (Exit)  65 bpm    Rating of Perceived Exertion (Exercise)  13    Perceived Dyspnea (Exercise)  0    Symptoms  None    Comments  None    Duration  Progress to 30 minutes of  aerobic without signs/symptoms of physical distress    Intensity  THRR unchanged      Progression   Progression  Continue to progress workloads to maintain  intensity without signs/symptoms of physical distress.    Average METs  3.32      Resistance Training   Training Prescription  Yes    Weight  3lbs    Reps  10-15    Time  10 Minutes      Interval Training   Interval Training  No      NuStep   Level  3    Minutes  10    METs  3.1      Arm Ergometer   Level  2    Watts  25    Minutes  10    METs  1.5      Track   Laps  16    Minutes  10    METs  3.76       Nutrition:  Target Goals: Understanding of nutrition guidelines, daily intake of sodium 1500mg , cholesterol 200mg , calories 30% from fat and 7% or less from saturated fats, daily to have 5 or more servings of fruits and vegetables.  Biometrics: Pre Biometrics - 04/06/18 1602      Pre Biometrics   Height  5\' 2"  (1.575 m)    Weight  63.2 kg    Waist Circumference  36 inches    Hip Circumference  39 inches    Waist to Hip Ratio  0.92 %    BMI (Calculated)  25.48    Triceps Skinfold  30 mm    % Body Fat  38.3 %    Grip Strength  25 kg    Flexibility  13 in    Single Leg Stand  3.03 seconds        Nutrition Therapy Plan and Nutrition Goals: Nutrition Therapy & Goals - 04/07/18 1046      Nutrition Therapy   Diet  heart healthy      Personal Nutrition Goals   Nutrition Goal  Pt to identify and limit food sources of saturated fat, trans fat, refined carbohydrates and sodium    Personal Goal #2  Pt able to name foods that affect blood glucose      Intervention Plan   Intervention  Prescribe, educate and counsel regarding individualized specific dietary modifications aiming towards targeted core components such as weight, hypertension, lipid management, diabetes, heart failure and other comorbidities.    Expected Outcomes  Short Term Goal: Understand basic principles of dietary content, such as calories, fat, sodium, cholesterol and nutrients.;Long Term Goal: Adherence to prescribed nutrition plan.       Nutrition Assessments: Nutrition Assessments -  04/07/18 1055      MEDFICTS Scores   Pre Score  24       Nutrition Goals Re-Evaluation: Nutrition Goals Re-Evaluation    Row Name 04/07/18 1046             Goals   Current Weight  139 lb 5.3 oz (  63.2 kg)          Nutrition Goals Re-Evaluation: Nutrition Goals Re-Evaluation    Row Name 04/07/18 1046             Goals   Current Weight  139 lb 5.3 oz (63.2 kg)          Nutrition Goals Discharge (Final Nutrition Goals Re-Evaluation): Nutrition Goals Re-Evaluation - 04/07/18 1046      Goals   Current Weight  139 lb 5.3 oz (63.2 kg)       Psychosocial: Target Goals: Acknowledge presence or absence of significant depression and/or stress, maximize coping skills, provide positive support system. Participant is able to verbalize types and ability to use techniques and skills needed for reducing stress and depression.  Initial Review & Psychosocial Screening: Initial Psych Review & Screening - 04/06/18 1615      Initial Review   Current issues with  Current Stress Concerns    Source of Stress Concerns  Occupation;Financial    Comments  Diana [name] is on leave from work.  She had to move in with her mother due to loss of income.       Family Dynamics   Good Support System?  Yes   Her mother is a source of support for her.      Barriers   Psychosocial barriers to participate in program  The patient should benefit from training in stress management and relaxation.      Screening Interventions   Interventions  Encouraged to exercise    Expected Outcomes  Short Term goal: Utilizing psychosocial counselor, staff and physician to assist with identification of specific Stressors or current issues interfering with healing process. Setting desired goal for each stressor or current issue identified.;Long Term Goal: Stressors or current issues are controlled or eliminated.       Quality of Life Scores: Quality of Life - 04/06/18 1616      Quality of Life   Select  Quality of  Life      Quality of Life Scores   Health/Function Pre  24.57 %    Socioeconomic Pre  22.69 %    Psych/Spiritual Pre  25.07 %    Family Pre  27 %    GLOBAL Pre  24.59 %      Scores of 19 and below usually indicate a poorer quality of life in these areas.  A difference of  2-3 points is a clinically meaningful difference.  A difference of 2-3 points in the total score of the Quality of Life Index has been associated with significant improvement in overall quality of life, self-image, physical symptoms, and general health in studies assessing change in quality of life.  PHQ-9: Recent Review Flowsheet Data    Depression screen [facility] 2/9 04/12/2018 01/02/2018 12/15/2017 11/14/2017 11/04/2017   Decreased Interest 0 0 0 0 0   Down, Depressed, Hopeless 0 0 0 0 0   PHQ - 2 Score 0 0 0 0 0     Interpretation of Total Score  Total Score Depression Severity:  1-4 = Minimal depression, 5-9 = Mild depression, 10-14 = Moderate depression, 15-19 = Moderately severe depression, 20-27 = Severe depression   Psychosocial Evaluation and Intervention: Psychosocial Evaluation - 04/12/18 0829      Psychosocial Evaluation & Interventions   Interventions  Encouraged to exercise with the program and follow exercise prescription;Stress management education;Relaxation education    Comments  Vocational Rehab paperwork given to patient. No other psychosocial interventions warranted at  this time. Pt enjoys bowling, watching movies, and skating.     Expected Outcomes  Diana [name] will report reduction in her stress levels.     Continue Psychosocial Services   Follow up required by staff       Psychosocial Re-Evaluation: Psychosocial Re-Evaluation    Row Name 04/17/18 0937 05/10/18 1700           Psychosocial Re-Evaluation   Current issues with  Current Stress Concerns  Current Stress Concerns      Comments  Vocational Rehab form given to patient.  She plans to bring it back soon.  Vocational Rehab forms has been faxed  to vocational counselor. Unfortunately, Diana [name] is currently having transportation issues.       Expected Outcomes  Diana [name] will report reduced stress levels.   Diana [name] will report the ability to manage her stressors.       Interventions  Relaxation education;Stress management education;Encouraged to attend Cardiac Rehabilitation for the exercise  Relaxation education;Stress management education;Encouraged to attend Cardiac Rehabilitation for the exercise      Continue Psychosocial Services   Follow up required by staff  -         Psychosocial Discharge (Final Psychosocial Re-Evaluation): Psychosocial Re-Evaluation - 05/10/18 1700      Psychosocial Re-Evaluation   Current issues with  Current Stress Concerns    Comments  Vocational Rehab forms has been faxed to vocational counselor. Unfortunately, Diana [name] is currently having transportation issues.     Expected Outcomes  Diana [name] will report the ability to manage her stressors.     Interventions  Relaxation education;Stress management education;Encouraged to attend Cardiac Rehabilitation for the exercise       Vocational Rehabilitation: Provide vocational rehab assistance to qualifying candidates.   Vocational Rehab Evaluation & Intervention: Vocational Rehab - 05/10/18 1701      Vocational Rehab Re-Evaulation   Comments  Form faxed 05/05/18.       Education: Education Goals: Education classes will be provided on a weekly basis, covering required topics. Participant will state understanding/return demonstration of topics presented.  Learning Barriers/Preferences: Learning Barriers/Preferences - 04/06/18 1610      Learning Barriers/Preferences   Learning Barriers  None    Learning Preferences  Skilled Demonstration;Pictoral;Video       Education Topics: Count Your Pulse:  -Group instruction provided by verbal instruction, demonstration, patient participation and written materials to support subject.  Instructors address importance of  being able to find your pulse and how to count your pulse when at home without a heart monitor.  Patients get hands on experience counting their pulse with staff help and individually.   Heart Attack, Angina, and Risk Factor Modification:  -Group instruction provided by verbal instruction, video, and written materials to support subject.  Instructors address signs and symptoms of angina and heart attacks.    Also discuss risk factors for heart disease and how to make changes to improve heart health risk factors.   Functional Fitness:  -Group instruction provided by verbal instruction, demonstration, patient participation, and written materials to support subject.  Instructors address safety measures for doing things around the house.  Discuss how to get up and down off the floor, how to pick things up properly, how to safely get out of a chair without assistance, and balance training.   CARDIAC REHAB PHASE II EXERCISE from 05/03/2018 in [facility] CARDIAC REHAB  Date  04/14/18  Instruction Review Code  2- Demonstrated Understanding      Meditation and  Mindfulness:  -Group instruction provided by verbal instruction, patient participation, and written materials to support subject.  Instructor addresses importance of mindfulness and meditation practice to help reduce stress and improve awareness.  Instructor also leads participants through a meditation exercise.    Stretching for Flexibility and Mobility:  -Group instruction provided by verbal instruction, patient participation, and written materials to support subject.  Instructors lead participants through series of stretches that are designed to increase flexibility thus improving mobility.  These stretches are additional exercise for major muscle groups that are typically performed during regular warm up and cool down.   Hands Only CPR:  -Group verbal, video, and participation provides a basic overview of AHA guidelines for  community CPR. Role-play of emergencies allow participants the opportunity to practice calling for help and chest compression technique with discussion of AED use.   Hypertension: -Group verbal and written instruction that provides a basic overview of hypertension including the most recent diagnostic guidelines, risk factor reduction with self-care instructions and medication management.   CARDIAC REHAB PHASE II EXERCISE from 05/03/2018 in [facility] CARDIAC REHAB  Date  04/19/18  Educator  RN  Instruction Review Code  2- Demonstrated Understanding       Nutrition I class: Heart Healthy Eating:  -Group instruction provided by PowerPoint slides, verbal discussion, and written materials to support subject matter. The instructor gives an explanation and review of the Therapeutic Lifestyle Changes diet recommendations, which includes a discussion on lipid goals, dietary fat, sodium, fiber, plant stanol/sterol esters, sugar, and the components of a well-balanced, healthy diet.   Nutrition II class: Lifestyle Skills:  -Group instruction provided by PowerPoint slides, verbal discussion, and written materials to support subject matter. The instructor gives an explanation and review of label reading, grocery shopping for heart health, heart healthy recipe modifications, and ways to make healthier choices when eating out.   Diabetes Question & Answer:  -Group instruction provided by PowerPoint slides, verbal discussion, and written materials to support subject matter. The instructor gives an explanation and review of diabetes co-morbidities, pre- and post-prandial blood glucose goals, pre-exercise blood glucose goals, signs, symptoms, and treatment of hypoglycemia and hyperglycemia, and foot care basics.   Diabetes Blitz:  -Group instruction provided by PowerPoint slides, verbal discussion, and written materials to support subject matter. The instructor gives an explanation and  review of the physiology behind type 1 and type 2 diabetes, diabetes medications and rational behind using different medications, pre- and post-prandial blood glucose recommendations and Hemoglobin A1c goals, diabetes diet, and exercise including blood glucose guidelines for exercising safely.    Portion Distortion:  -Group instruction provided by PowerPoint slides, verbal discussion, written materials, and food models to support subject matter. The instructor gives an explanation of serving size versus portion size, changes in portions sizes over the last 20 years, and what consists of a serving from each food group.   Stress Management:  -Group instruction provided by verbal instruction, video, and written materials to support subject matter.  Instructors review role of stress in heart disease and how to cope with stress positively.     Exercising on Your Own:  -Group instruction provided by verbal instruction, power point, and written materials to support subject.  Instructors discuss benefits of exercise, components of exercise, frequency and intensity of exercise, and end points for exercise.  Also discuss use of nitroglycerin and activating EMS.  Review options of places to exercise outside of rehab.  Review guidelines for sex with heart  disease.   Cardiac Drugs I:  -Group instruction provided by verbal instruction and written materials to support subject.  Instructor reviews cardiac drug classes: antiplatelets, anticoagulants, beta blockers, and statins.  Instructor discusses reasons, side effects, and lifestyle considerations for each drug class.   CARDIAC REHAB PHASE II EXERCISE from 05/03/2018 in [facility] CARDIAC REHAB  Date  04/12/18  Educator  Pharmacist  Instruction Review Code  2- Demonstrated Understanding      Cardiac Drugs II:  -Group instruction provided by verbal instruction and written materials to support subject.  Instructor reviews cardiac drug  classes: angiotensin converting enzyme inhibitors (ACE-I), angiotensin II receptor blockers (ARBs), nitrates, and calcium channel blockers.  Instructor discusses reasons, side effects, and lifestyle considerations for each drug class.   CARDIAC REHAB PHASE II EXERCISE from 05/03/2018 in [facility] CARDIAC REHAB  Date  05/03/18  Educator  Pharmacist   Instruction Review Code  2- Demonstrated Understanding      Anatomy and Physiology of the Circulatory System:  Group verbal and written instruction and models provide basic cardiac anatomy and physiology, with the coronary electrical and arterial systems. Review of: AMI, Angina, Valve disease, Heart Failure, Peripheral Artery Disease, Cardiac Arrhythmia, Pacemakers, and the ICD.   CARDIAC REHAB PHASE II EXERCISE from 05/03/2018 in [facility] CARDIAC REHAB  Date  04/26/18  Educator  RN  Instruction Review Code  2- Demonstrated Understanding      Other Education:  -Group or individual verbal, written, or video instructions that support the educational goals of the cardiac rehab program.   Holiday Eating Survival Tips:  -Group instruction provided by PowerPoint slides, verbal discussion, and written materials to support subject matter. The instructor gives patients tips, tricks, and techniques to help them not only survive but enjoy the holidays despite the onslaught of food that accompanies the holidays.   Knowledge Questionnaire Score: Knowledge Questionnaire Score - 04/06/18 1616      Knowledge Questionnaire Score   Pre Score  17/24       Core Components/Risk Factors/Patient Goals at Admission: Personal Goals and Risk Factors at Admission - 04/06/18 1608      Core Components/Risk Factors/Patient Goals on Admission    Weight Management  Weight Maintenance;Yes    Intervention  Weight Management: Develop a combined nutrition and exercise program designed to reach desired caloric intake, while  maintaining appropriate intake of nutrient and fiber, sodium and fats, and appropriate energy expenditure required for the weight goal.;Weight Management: Provide education and appropriate resources to help participant work on and attain dietary goals.    Admit Weight  137 lb 5.6 oz (62.3 kg)    Expected Outcomes  Weight Maintenance: Understanding of the daily nutrition guidelines, which includes 25-35% calories from fat, 7% or less cal from saturated fats, less than 200mg  cholesterol, less than 1.5gm of sodium, & 5 or more servings of fruits and vegetables daily;Short Term: Continue to assess and modify interventions until short term weight is achieved;Long Term: Adherence to nutrition and physical activity/exercise program aimed toward attainment of established weight goal;Understanding recommendations for meals to include 15-35% energy as protein, 25-35% energy from fat, 35-60% energy from carbohydrates, less than 200mg  of dietary cholesterol, 20-35 gm of total fiber daily;Understanding of distribution of calorie intake throughout the day with the consumption of 4-5 meals/snacks    Hypertension  Yes    Intervention  Provide education on lifestyle modifcations including regular physical activity/exercise, weight management, moderate sodium restriction and increased  consumption of fresh fruit, vegetables, and low fat dairy, alcohol moderation, and smoking cessation.;Monitor prescription use compliance.    Expected Outcomes  Short Term: Continued assessment and intervention until BP is < 140/5790mm HG in hypertensive participants. < 130/2280mm HG in hypertensive participants with diabetes, heart failure or chronic kidney disease.;Long Term: Maintenance of blood pressure at goal levels.    Lipids  Yes    Intervention  Provide education and support for participant on nutrition & aerobic/resistive exercise along with prescribed medications to achieve LDL 70mg , HDL >40mg .    Expected Outcomes  Short Term:  Participant states understanding of desired cholesterol values and is compliant with medications prescribed. Participant is following exercise prescription and nutrition guidelines.;Long Term: Cholesterol controlled with medications as prescribed, with individualized exercise RX and with personalized nutrition plan. Value goals: LDL < 70mg , HDL > 40 mg.    Stress  Yes    Intervention  Offer individual and/or small group education and counseling on adjustment to heart disease, stress management and health-related lifestyle change. Teach and support self-help strategies.;Refer participants experiencing significant psychosocial distress to appropriate mental health specialists for further evaluation and treatment. When possible, include family members and significant others in education/counseling sessions.       Core Components/Risk Factors/Patient Goals Review:  Goals and Risk Factor Review    Row Name 04/12/18 [id number] 04/17/18 0940 05/10/18 1702         Core Components/Risk Factors/Patient Goals Review   Personal Goals Review  Stress;Hypertension;Lipids;Weight Management/Obesity  Stress;Hypertension;Lipids;Weight Management/Obesity  Stress;Hypertension;Lipids;Weight Management/Obesity     Review  Pt willing to participate in CR exercise. Pt would like to increase her strength.   Pt with multiple CAD RFs.  She started exercise recently and is tolerating it well. VSS.  Pt with multiple CAD RFs.  She is tolerating exercise well.  Diana [name] feels that she is increasing her strength.      Expected Outcomes  Pt will continue to participate in CR exercise, nutrition, and lifestyle modification.   Pt will continue to participate in CR exercise, nutrition, and lifestyle modification.   Pt will continue to participate in CR exercise, nutrition, and lifestyle modification.         Core Components/Risk Factors/Patient Goals at Discharge (Final Review):  Goals and Risk Factor Review - 05/10/18 1702      Core  Components/Risk Factors/Patient Goals Review   Personal Goals Review  Stress;Hypertension;Lipids;Weight Management/Obesity    Review  Pt with multiple CAD RFs.  She is tolerating exercise well.  Diana [name] feels that she is increasing her strength.     Expected Outcomes  Pt will continue to participate in CR exercise, nutrition, and lifestyle modification.        ITP Comments: ITP Comments    Row Name 04/06/18 1612 04/17/18 0930 05/10/18 1654       ITP Comments  Dr. Armanda Magicraci Turner Medical Director  30 Day ITP Review. Pt has started exercise recently and is tolerating it well.   30 Day ITP Review. Diana [name] has been tolerating exercise well. She feels that she is increasing her strength.         Comments: See ITP Comments.

## 2018-05-12 ENCOUNTER — Encounter (HOSPITAL_COMMUNITY): Payer: BLUE CROSS/BLUE SHIELD

## 2018-05-15 ENCOUNTER — Encounter (HOSPITAL_COMMUNITY): Payer: BLUE CROSS/BLUE SHIELD

## 2018-05-15 ENCOUNTER — Encounter (HOSPITAL_COMMUNITY)
Admission: RE | Admit: 2018-05-15 | Discharge: 2018-05-15 | Disposition: A | Discharge status: Home / Self Care | Payer: BLUE CROSS/BLUE SHIELD | Source: Ambulatory Visit | Attending: Cardiology | Admitting: Cardiology

## 2018-05-15 DIAGNOSIS — Z951 Presence of aortocoronary bypass graft: Secondary | ICD-10-CM | POA: Diagnosis not present

## 2018-05-17 ENCOUNTER — Encounter (HOSPITAL_COMMUNITY): Payer: BLUE CROSS/BLUE SHIELD

## 2018-05-17 ENCOUNTER — Encounter (HOSPITAL_COMMUNITY)
Admission: RE | Admit: 2018-05-17 | Discharge: 2018-05-17 | Disposition: A | Discharge status: Home / Self Care | Payer: BLUE CROSS/BLUE SHIELD | Source: Ambulatory Visit | Attending: Cardiology | Admitting: Cardiology

## 2018-05-17 DIAGNOSIS — Z951 Presence of aortocoronary bypass graft: Secondary | ICD-10-CM

## 2018-05-19 ENCOUNTER — Encounter (HOSPITAL_COMMUNITY): Payer: BLUE CROSS/BLUE SHIELD

## 2018-05-19 ENCOUNTER — Encounter (HOSPITAL_COMMUNITY)
Admission: RE | Admit: 2018-05-19 | Discharge: 2018-05-19 | Disposition: A | Discharge status: Home / Self Care | Payer: BLUE CROSS/BLUE SHIELD | Source: Ambulatory Visit | Attending: Cardiology | Admitting: Cardiology

## 2018-05-19 DIAGNOSIS — Z951 Presence of aortocoronary bypass graft: Secondary | ICD-10-CM | POA: Diagnosis not present

## 2018-05-22 ENCOUNTER — Encounter (HOSPITAL_COMMUNITY)
Admission: RE | Admit: 2018-05-22 | Discharge: 2018-05-22 | Disposition: A | Discharge status: Home / Self Care | Payer: BLUE CROSS/BLUE SHIELD | Source: Ambulatory Visit | Attending: Cardiology | Admitting: Cardiology

## 2018-05-22 ENCOUNTER — Encounter (HOSPITAL_COMMUNITY): Payer: BLUE CROSS/BLUE SHIELD

## 2018-05-22 DIAGNOSIS — Z951 Presence of aortocoronary bypass graft: Secondary | ICD-10-CM | POA: Diagnosis not present

## 2018-05-26 ENCOUNTER — Encounter (HOSPITAL_COMMUNITY): Payer: BLUE CROSS/BLUE SHIELD

## 2018-05-26 ENCOUNTER — Encounter (HOSPITAL_COMMUNITY)
Admission: RE | Admit: 2018-05-26 | Discharge: 2018-05-26 | Disposition: A | Discharge status: Home / Self Care | Payer: BLUE CROSS/BLUE SHIELD | Source: Ambulatory Visit | Attending: Cardiology | Admitting: Cardiology

## 2018-05-26 ENCOUNTER — Telehealth: Payer: Self-pay | Admitting: Emergency Medicine

## 2018-05-26 ENCOUNTER — Encounter: Payer: Self-pay | Admitting: Cardiology

## 2018-05-26 ENCOUNTER — Ambulatory Visit (INDEPENDENT_AMBULATORY_CARE_PROVIDER_SITE_OTHER): Payer: BLUE CROSS/BLUE SHIELD | Admitting: Cardiology

## 2018-05-26 VITALS — BP 126/72 | HR 70 | Ht 63.0 in | Wt 141.6 lb

## 2018-05-26 DIAGNOSIS — E785 Hyperlipidemia, unspecified: Secondary | ICD-10-CM | POA: Diagnosis not present

## 2018-05-26 DIAGNOSIS — Z951 Presence of aortocoronary bypass graft: Secondary | ICD-10-CM

## 2018-05-26 DIAGNOSIS — I25759 Atherosclerosis of native coronary artery of transplanted heart with unspecified angina pectoris: Secondary | ICD-10-CM

## 2018-05-26 DIAGNOSIS — R079 Chest pain, unspecified: Secondary | ICD-10-CM

## 2018-05-26 DIAGNOSIS — I10 Essential (primary) hypertension: Secondary | ICD-10-CM | POA: Diagnosis not present

## 2018-05-26 NOTE — Progress Notes (Signed)
Cardiology Office Note:    Date:  05/26/2018   ID:  Diana [name], DOB 04/13/1956, MRN [id number]  PCP:  Porfirio OarJeffery, [place], PA  Cardiologist:  Gypsy Balsamobert Krasowski, MD    Referring MD: Porfirio OarJeffery, [place], PA   Chief Complaint  Patient presents with  . Chest tightness and shoulder pain  I have a chest pain  History of Present Illness:    Diana Muscaamela M Suarez is a 62 y.o. female with recent history of coronary artery bypass graft.  She is actually in the rehab and when she is in rehab she started experiencing chest pain she does have quite complex pains some pain in the neck and actually does a reproducible by moving her shoulder moving her neck and I can reproduce by pressing it neck on the back.  Also described to have some pain while palpating her sternum however she also described to have some pressure that happen when she is walking.  Interestingly before her bypass surgery she also got similar symptoms.  She tells me that this is not exactly the same but obviously concerned.  Past Medical History:  Diagnosis Date  . Allergy   . Arthritis    feet  . Bronchitis 10/30/2016  . Carpal tunnel syndrome   . Coronary artery disease   . GERD (gastroesophageal reflux disease)   . Hyperlipidemia   . Hypertension   . Non-ST elevation (NSTEMI) myocardial infarction (HCC) 10/30/2016  . NSTEMI (non-ST elevated myocardial infarction) (HCC) 10/30/2016    Past Surgical History:  Procedure Laterality Date  . CARPAL TUNNEL RELEASE Left 02/12/2016   Procedure: LEFT CARPAL TUNNEL RELEASE;  Surgeon: Cindee SaltGary Kuzma, MD;  Location: [facility];  Service: Orthopedics;  Laterality: Left;  FAB  . CORONARY ARTERY BYPASS GRAFT N/A 01/10/2018   Procedure: CORONARY ARTERY BYPASS GRAFTING (CABG) TIMES 4, USING LEFT INTERNAL MAMMARY ARTERY TO OM1  AND ENDOSCOPICALLY HARVESTED RIGHT SAPHENOUS VEIN TO DIAGONAL, AND SEQUENTIALLY TO OM2 AND DISTAL CIRCUMFLEX;  Surgeon: Delight OvensGerhardt, Edward B, MD;  Location: [facility] OR;  Service:  Open Heart Surgery;  Laterality: N/A;  . LEFT HEART CATH AND CORONARY ANGIOGRAPHY N/A 01/05/2018   Procedure: LEFT HEART CATH AND CORONARY ANGIOGRAPHY;  Surgeon: Lennette BihariKelly, Thomas A, MD;  Location: [facility] INVASIVE CV LAB;  Service: Cardiovascular;  Laterality: N/A;  . TEE WITHOUT CARDIOVERSION N/A 01/10/2018   Procedure: TRANSESOPHAGEAL ECHOCARDIOGRAM (TEE);  Surgeon: Delight OvensGerhardt, Edward B, MD;  [facility];  Service: Open Heart Surgery;  Laterality: N/A;  . TONSILLECTOMY    . TUBAL LIGATION    . WRIST SURGERY Right     Current Medications: Current Meds  Medication Sig  . ALPRAZolam (XANAX) 0.5 MG tablet Take 1 tablet (0.5 mg total) by mouth 2 (two) times daily as needed for anxiety.  Marland Kitchen. aspirin EC 81 MG tablet Take 81 mg by mouth daily.  Marland Kitchen. atorvastatin (LIPITOR) 80 MG tablet Take 0.5 tablets (40 mg total) by mouth daily at 6 PM.  . Metoprolol Tartrate 75 MG TABS Take 75 mg by mouth every 12 (twelve) hours.  Marland Kitchen. omeprazole (PRILOSEC) 20 MG capsule Take 1 capsule (20 mg total) by mouth daily.  . pregabalin (LYRICA) 150 MG capsule Take 150 mg by mouth 2 (two) times daily.  . pregabalin (LYRICA) 75 MG capsule Take 1 capsule (75 mg total) by mouth 2 (two) times daily.  . traZODone (DESYREL) 50 MG tablet Take 25-50 mg by mouth at bedtime as needed for sleep.     Allergies:   Contrast  media [iodinated diagnostic agents]; Penicillins; and Codeine   Social History   Socioeconomic History  . Marital status: Single    Spouse name: Alcario Drought  . Number of children: 2  . Years of education: 12th grade  . Highest education level: Not on file  Occupational History  . Occupation: Warden/ranger: [employer]  Social Needs  . Financial resource strain: Not on file  . Food insecurity:    Worry: Not on file    Inability: Not on file  . Transportation needs:    Medical: Not on file    Non-medical: Not on file  Tobacco Use  . Smoking status: Never Smoker  . Smokeless tobacco: Never Used    Substance and Sexual Activity  . Alcohol use: No  . Drug use: No  . Sexual activity: Not Currently  Lifestyle  . Physical activity:    Days per week: Not on file    Minutes per session: Not on file  . Stress: Not on file  Relationships  . Social connections:    Talks on phone: Not on file    Gets together: Not on file    Attends religious service: Not on file    Active member of club or organization: Not on file    Attends meetings of clubs or organizations: Not on file    Relationship status: Not on file  Other Topics Concern  . Not on file  Social History Narrative   Divorced from her husband. She has two adult sons from that union . One lives in [place], the other lives in [place] (as a Solicitor) following Office manager.   Married Alcario Drought 05/2014.     Family History: The patient's family history includes Brain cancer in her sister; HIV in her sister; Healthy in her brother; Hypertension in her mother; Liver disease in her brother; Thyroid disease in her mother. ROS:   Please see the history of present illness.    All 14 point review of systems negative except as described per history of present illness  EKGs/Labs/Other Studies Reviewed:    Normal sinus rhythm possible left atrial enlargement nonspecific ST-T segment changes.  Recent Labs: 01/11/2018: Magnesium 1.9 01/16/2018: BUN 10; Creatinine, Ser 0.67; Hemoglobin 9.2; Platelets 241; Potassium 3.9; Sodium 135 03/10/2018: ALT 49  Recent Lipid Panel    Component Value Date/Time   CHOL 128 03/10/2018 1615   TRIG 168 (H) 03/10/2018 1615   HDL 44 03/10/2018 1615   CHOLHDL 2.9 03/10/2018 1615   CHOLHDL 3.0 10/31/2016 0129   VLDL 10 10/31/2016 0129   LDLCALC 50 03/10/2018 1615    Physical Exam:    VS:  BP 126/72   Pulse 70   Ht 5\' 3"  (1.6 m)   Wt 141 lb 9.6 oz (64.2 kg)   SpO2 98%   BMI 25.08 kg/m     Wt Readings from Last 3 Encounters:  05/26/18 141 lb 9.6 oz (64.2 kg)  04/06/18 139 lb 5.3 oz (63.2 kg)  03/10/18  137 lb 12.8 oz (62.5 kg)     GEN:  Well nourished, well developed in no acute distress HEENT: Normal NECK: No JVD; No carotid bruits LYMPHATICS: No lymphadenopathy CARDIAC: RRR, no murmurs, no rubs, no gallops RESPIRATORY:  Clear to auscultation without rales, wheezing or rhonchi  ABDOMEN: Soft, non-tender, non-distended MUSCULOSKELETAL:  No edema; No deformity  SKIN: Warm and dry LOWER EXTREMITIES: no swelling NEUROLOGIC:  Alert and oriented x 3 PSYCHIATRIC:  Normal affect  ASSESSMENT:    1. 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 Benign essential HTN   3. Hyperlipidemia, unspecified hyperlipidemia type   4. S/P CABG x 4    PLAN:    In order of problems listed above:  1. Coronary artery disease test was coronary to bypass graft with very atypical chest pain right now clearly part of it is musculoskeletal however when to make sure were not dealing with any reactivation of coronary artery disease.  Therefore I will ask him to have EKG today I will check a troponin I I will put on the schedule to have Lexiscan. 2. Dyslipidemia we will continue high intensity statin. 3. Essential hypertension blood pressure well controlled.  I will ask her not to go to rehab for 10 days continue taking Voltaren until we clarified this diagnosis.   Medication Adjustments/Labs and Tests Ordered: Current medicines are reviewed at length with the patient today.  Concerns regarding medicines are outlined above.  No orders of the defined types were placed in this encounter.  Medication changes: No orders of the defined types were placed in this encounter.   Signed, Georgeanna Leaobert J. Krasowski, MD, [facility] 05/26/2018 3:51 PM    [facility]

## 2018-05-26 NOTE — Telephone Encounter (Signed)
Nurse from cardiac rehab calling because patient has been complaining of chest heaviness since the beginning of December, along with pain between her shoulder blades. She saw her pcp on 05/08/18 for this and was advised to increase lyrica and use heating pad for musculoskeletal pain. She feels it most when she is laying down and gets relief when she sits up. Dr. Bing MatterKrasowski aware and would like to see patient, we have worked [place] in [place] schedule today [place] she verbally understands. Called patient back and left voicemail to inform her that she can be billed for copay it doesn't have to be collected today.

## 2018-05-26 NOTE — Patient Instructions (Signed)
Medication Instructions:  Your physician recommends that you continue on your current medications as directed. Please refer to the Current Medication list given to you today.  If you need a refill on your cardiac medications before your next appointment, please call your pharmacy.   Lab work: Your physician recommends that you return for lab work today: Troponin   If you have labs (blood work) drawn today and your tests are completely normal, you will receive your results only by: Marland Kitchen. MyChart Message (if you have MyChart) OR . A paper copy in the mail If you have any lab test that is abnormal or we need to change your treatment, we will call you to review the results.  Testing/Procedures: Your physician has requested that you have a lexiscan myoview. For further information please visit [email or web address]. Please follow instruction sheet, as given.    Follow-Up: At [facility], you and your health needs are our priority.  As part of our continuing mission to provide you with exceptional heart care, we have created designated Provider Care Teams.  These Care Teams include your primary Cardiologist (physician) and Advanced Practice Providers (APPs -  Physician Assistants and Nurse Practitioners) who all work together to provide you with the care you need, when you need it. You will need a follow up appointment in 1 months.  Please call our office 2 months in advance to schedule this appointment.  You may see No primary care provider on file. or another member of our [facility] Provider Team in [place]: Norman HerrlichBrian Munley, MD . Belva Cromeajan Revankar, MD  Any Other Special Instructions Will Be Listed Below (If Applicable).  Dr. Bing MatterKrasowski has advised you to stop cardiac rehab for one week.     Cardiac Nuclear Scan A cardiac nuclear scan is a test that measures blood flow to the heart when a person is resting and when he or she is exercising. The test looks for problems such as:  Not enough  blood reaching a portion of the heart.  The heart muscle not working normally. You may need this test if:  You have heart disease.  You have had abnormal lab results.  You have had heart surgery or a balloon procedure to open up blocked arteries (angioplasty).  You have chest pain.  You have shortness of breath. In this test, a radioactive dye (tracer) is injected into your bloodstream. After the tracer has traveled to your heart, an imaging device is used to measure how much of the tracer is absorbed by or distributed to various areas of your heart. This procedure is usually done at a hospital and takes 2-4 hours. Tell a health care provider about:  Any allergies you have.  All medicines you are taking, including vitamins, herbs, eye drops, creams, and over-the-counter medicines.  Any problems you or family members have had with anesthetic medicines.  Any blood disorders you have.  Any surgeries you have had.  Any medical conditions you have.  Whether you are pregnant or may be pregnant. What are the risks? Generally, this is a safe procedure. However, problems may occur, including:  Serious chest pain and heart attack. This is only a risk if the stress portion of the test is done.  Rapid heartbeat.  Sensation of warmth in your chest. This usually passes quickly.  Allergic reaction to the tracer. What happens before the procedure?  Ask your health care provider about changing or stopping your regular medicines. This is especially important if you are taking diabetes medicines  or blood thinners.  Follow instructions from your health care provider about eating or drinking restrictions.  Remove your jewelry on the day of the procedure. What happens during the procedure?  An IV will be inserted into one of your veins.  Your health care provider will inject a small amount of radioactive tracer through the IV.  You will wait for 20-40 minutes while the tracer travels  through your bloodstream.  Your heart activity will be monitored with an electrocardiogram (ECG).  You will lie down on an exam table.  Images of your heart will be taken for about 15-20 minutes.  You may also have a stress test. For this test, one of the following may be done: ? You will exercise on a treadmill or stationary bike. While you exercise, your heart's activity will be monitored with an ECG, and your blood pressure will be checked. ? You will be given medicines that will increase blood flow to parts of your heart. This is done if you are unable to exercise.  When blood flow to your heart has peaked, a tracer will again be injected through the IV.  After 20-40 minutes, you will get back on the exam table and have more images taken of your heart.  Depending on the type of tracer used, scans may need to be repeated 3-4 hours later.  Your IV line will be removed when the procedure is over. The procedure may vary among health care providers and hospitals. What happens after the procedure?  Unless your health care provider tells you otherwise, you may return to your normal schedule, including diet, activities, and medicines.  Unless your health care provider tells you otherwise, you may increase your fluid intake. This will help to flush the contrast dye from your body. Drink enough fluid to keep your urine pale yellow.  Ask your health care provider, or the department that is doing the test: ? When will my results be ready? ? How will I get my results? Summary  A cardiac nuclear scan measures the blood flow to the heart when a person is resting and when he or she is exercising.  Tell your health care provider if you are pregnant.  Before the procedure, ask your health care provider about changing or stopping your regular medicines. This is especially important if you are taking diabetes medicines or blood thinners.  After the procedure, unless your health care provider  tells you otherwise, increase your fluid intake. This will help flush the contrast dye from your body.  After the procedure, unless your health care provider tells you otherwise, you may return to your normal schedule, including diet, activities, and medicines. This information is not intended to replace advice given to you by your health care provider. Make sure you discuss any questions you have with your health care provider. Document Released: 06/11/2004 Document Revised: 10/31/2017 Document Reviewed: 10/31/2017 Elsevier Interactive Patient Education  2019 [employer]

## 2018-05-26 NOTE — Progress Notes (Signed)
Pt reported chest heaviness and shoulder pain between the shoulder blades x 2 weeks.  This heaviness and pain lasts all the time but varies in its intensity.  She endorses that this pain is worse when lying flat at night and she has to sit upright for the heaviness to decrease.  She notices when it when going from a seated to standing position and when walking.  The heaviness decreases when she rests after walking.  Pt has seen PCP about this on 05/05/2018.  Per this OV note, Lyrica was increased and heat pack was recommended.  Pt has increased Lyrica with no relief of symptoms.  Dr. Vanetta ShawlKrasowski's office notified and appointment made for pt to be [facility] 3[facility].  VSS during exercise and relayed to RN.  Will fax strips to office for review.  Pt concerned of co-pay d/t lack of income and other financial burdens.  Emotional support given.

## 2018-05-27 LAB — TROPONIN I: TROPONIN I: 0.02 ng/mL (ref 0.00–0.04)

## 2018-05-29 ENCOUNTER — Encounter (HOSPITAL_COMMUNITY): Payer: BLUE CROSS/BLUE SHIELD

## 2018-05-29 ENCOUNTER — Telehealth (HOSPITAL_COMMUNITY): Payer: Self-pay | Admitting: Physician Assistant

## 2018-05-29 ENCOUNTER — Telehealth (HOSPITAL_COMMUNITY): Payer: Self-pay | Admitting: *Deleted

## 2018-05-29 NOTE — Telephone Encounter (Signed)
Left message on voicemail per DPR in reference to upcoming appointment scheduled on 05/30/18 at 0715 with detailed instructions given per Myocardial Perfusion Study Information Sheet for the test. LM to arrive 15 minutes early, and that it is imperative to arrive on time for appointment to keep from having the test rescheduled. If you need to cancel or reschedule your appointment, please call the office within 24 hours of your appointment. Failure to do so may result in a cancellation of your appointment, and a $50 no show fee. Phone number given for call back for any questions. Diana [name], [name]

## 2018-05-29 NOTE — Progress Notes (Signed)
I have reviewed a Home Exercise Prescription with Diana [name] . Diana [name] is not currently exercising at home. The patient was advised to walk 2-3 days a week for 30-45 minutes.  Diana [name] and I discussed how to progress their exercise prescription. The patient stated that they understand the exercise prescription. We reviewed exercise guidelines, target heart rate during exercise, RPE Scale, weather conditions, NTG use, endpoints for exercise, warmup and cool down.  Patient is encouraged to come to me with any questions. I will continue to follow up with the patient to assist them with progression and safety.    04/24/2018 1:48 PM 05/29/2018

## 2018-05-30 ENCOUNTER — Ambulatory Visit (HOSPITAL_COMMUNITY): Payer: BLUE CROSS/BLUE SHIELD | Attending: Cardiology

## 2018-05-30 ENCOUNTER — Other Ambulatory Visit: Payer: Self-pay | Admitting: *Deleted

## 2018-05-30 VITALS — Ht 63.0 in | Wt 141.0 lb

## 2018-05-30 DIAGNOSIS — I25759 Atherosclerosis of native coronary artery of transplanted heart with unspecified angina pectoris: Secondary | ICD-10-CM | POA: Insufficient documentation

## 2018-05-30 DIAGNOSIS — I200 Unstable angina: Secondary | ICD-10-CM | POA: Diagnosis present

## 2018-05-30 DIAGNOSIS — R079 Chest pain, unspecified: Secondary | ICD-10-CM | POA: Diagnosis not present

## 2018-05-30 DIAGNOSIS — Z951 Presence of aortocoronary bypass graft: Secondary | ICD-10-CM | POA: Diagnosis present

## 2018-05-30 LAB — MYOCARDIAL PERFUSION IMAGING
CHL CUP NUCLEAR SSS: 9
LVDIAVOL: 51 mL (ref 46–106)
LVSYSVOL: 24 mL
NUC STRESS TID: 0.84
Peak HR: 109 {beats}/min
Rest HR: 78 {beats}/min
SDS: 8
SRS: 1

## 2018-05-30 MED ORDER — NITROGLYCERIN 0.4 MG SL SUBL: 0.4000 mg | SUBLINGUAL_TABLET | SUBLINGUAL | 3 refills | Status: AC | PRN

## 2018-05-30 MED ORDER — TECHNETIUM TC 99M TETROFOSMIN IV KIT
32.4000 | PACK | Freq: Once | INTRAVENOUS | Status: AC | PRN
  Administered 2018-05-30: 32.4 via INTRAVENOUS
  Filled 2018-05-30: qty 33

## 2018-05-30 MED ORDER — REGADENOSON 0.4 MG/5ML IV SOLN
0.4000 mg | Freq: Once | INTRAVENOUS | Status: AC
  Administered 2018-05-30: 0.4 mg via INTRAVENOUS

## 2018-05-30 MED ORDER — TECHNETIUM TC 99M TETROFOSMIN IV KIT
9.8000 | PACK | Freq: Once | INTRAVENOUS | Status: AC | PRN
  Administered 2018-05-30: 9.8 via INTRAVENOUS
  Filled 2018-05-30: qty 10

## 2018-05-30 NOTE — Progress Notes (Signed)
ni

## 2018-06-02 ENCOUNTER — Encounter (HOSPITAL_COMMUNITY): Payer: BLUE CROSS/BLUE SHIELD

## 2018-06-05 ENCOUNTER — Encounter (HOSPITAL_COMMUNITY): Payer: BLUE CROSS/BLUE SHIELD

## 2018-06-05 ENCOUNTER — Encounter (HOSPITAL_COMMUNITY)
Admission: RE | Admit: 2018-06-05 | Discharge: 2018-06-05 | Disposition: A | Discharge status: Home / Self Care | Payer: BLUE CROSS/BLUE SHIELD | Source: Ambulatory Visit | Attending: Cardiology | Admitting: Cardiology

## 2018-06-05 DIAGNOSIS — Z951 Presence of aortocoronary bypass graft: Secondary | ICD-10-CM | POA: Insufficient documentation

## 2018-06-07 ENCOUNTER — Encounter (HOSPITAL_COMMUNITY): Payer: BLUE CROSS/BLUE SHIELD

## 2018-06-07 ENCOUNTER — Telehealth (HOSPITAL_COMMUNITY): Payer: Self-pay | Admitting: *Deleted

## 2018-06-07 ENCOUNTER — Encounter (HOSPITAL_COMMUNITY)
Admission: RE | Admit: 2018-06-07 | Discharge: 2018-06-07 | Disposition: A | Discharge status: Home / Self Care | Payer: BLUE CROSS/BLUE SHIELD | Source: Ambulatory Visit | Attending: Cardiology | Admitting: Cardiology

## 2018-06-07 ENCOUNTER — Encounter (HOSPITAL_COMMUNITY): Payer: Self-pay

## 2018-06-07 DIAGNOSIS — Z951 Presence of aortocoronary bypass graft: Secondary | ICD-10-CM

## 2018-06-07 NOTE — Telephone Encounter (Signed)
Diana [name] presented insurance information for review of copay for participation in CR.  Requested information returned to patient. Pt would be required to pay a 30% copayment.  Pt is without an income at this time and would like to discuss with her family the feasibility of this 30% copay.   Diana [name] will let CR know on Friday 06/09/2018.

## 2018-06-08 NOTE — Progress Notes (Signed)
Cardiac Individual Treatment Plan  Patient Details  Name: Diana [name] MRN: [id number] Date of Birth: 04/14/1956 Referring Provider:     CARDIAC REHAB PHASE II [facility]ATION [facility]2018 [facility]  Referring Provider  Tilley/Krasowski      Initial Encounter Date:    CARD[facility]AC REHAB [facility]ASE [facility]  Date  04/06/18      Visit Diagnosis: S/P CABG x 4  Patient's Home Medications on Admission:  Current Outpatient Medications:  .  ALPRAZolam (XANAX) 0.5 MG tablet, Take 1 tablet (0.5 mg total) by mouth 2 (two) times daily as needed for anxiety., Disp: 20 tablet, Rfl: 0 .  aspirin EC 81 MG tablet, Take 81 mg by mouth daily., Disp: , Rfl:  .  atorvastatin (LIPITOR) 80 MG tablet, Take 0.5 tablets (40 mg total) by mouth daily at 6 PM., Disp: 60 tablet, Rfl: 1 .  Metoprolol Tartrate 75 MG TABS, Take 75 mg by mouth every 12 (twelve) hours., Disp: 60 tablet, Rfl: 1 .  nitroGLYCERIN (NITROSTAT) 0.4 MG SL tablet, Place 1 tablet (0.4 mg total) under the tongue every 5 (five) minutes as needed for chest pain., Disp: 25 tablet, Rfl: 3 .  omeprazole (PRILOSEC) 20 MG capsule, Take 1 capsule (20 mg total) by mouth daily., Disp: 90 capsule, Rfl: 1 .  pregabalin (LYRICA) 150 MG capsule, Take 150 mg by mouth 2 (two) times daily., Disp: , Rfl:  .  pregabalin (LYRICA) 75 MG capsule, Take 1 capsule (75 mg total) by mouth 2 (two) times daily., Disp: 60 capsule, Rfl: 1 .  traZODone (DESYREL) 50 MG tablet, Take 25-50 mg by mouth at bedtime as needed for sleep., Disp: , Rfl:   Past Medical History: Past Medical History:  Diagnosis Date  . Allergy   . Arthritis    feet  . Bronchitis 10/30/2016  . Carpal tunnel syndrome   . Coronary artery disease   . GERD (gastroesophageal reflux disease)   . Hyperlipidemia   . Hypertension   . Non-ST elevation (NSTEMI) myocardial infarction (HCC) 10/30/2016  . NSTEMI (non-ST  elevated myocardial infarction) (HCC) 10/30/2016    Tobacco Use: Social History   Tobacco Use  Smoking Status Never Smoker  Smokeless Tobacco Never Used    Labs: Recent Review Flowsheet Data    Labs for ITP Cardiac and Pulmonary Rehab Latest Ref Rng & Units 01/10/2018 01/10/2018 01/10/2018 01/11/2018 03/10/2018   Cholestrol 100 - 199 mg/dL - - - - 409128   LDLCALC 0 - 99 mg/dL - - - - 50   HDL >81>39 mg/dL - - - - 44   Trlycerides 0 - 149 mg/dL - - - - 191(Y168(H)   Hemoglobin A1c 4.8 - 5.6 % - - - - -   PHART 7.350 - 7.450 7.337(L) 7.431 - - -   PCO2ART 32.0 - 48.0 mmHg 47.2 36.1 - - -   HCO3 20.0 - 28.0 mmol/L 25.5 24.1 - - -   TCO2 22 - 32 mmol/L 27 25 24 26  -   ACIDBASEDEF 0.0 - 2.0 mmol/L 1.0 - - - -   O2SAT % 99.0 97.0 - - -      Capillary Blood Glucose: Lab Results  Component Value Date   GLUCAP 70 01/18/2018   GLUCAP 110 (H) 01/14/2018   GLUCAP 94 01/13/2018   GLUCAP 109 (H) 01/13/2018   GLUCAP 96 01/13/2018     Exercise Target Goals: Exercise Program Goal:  Individual exercise prescription set using results from initial 6 min walk test and THRR while considering  patient's activity barriers and safety.   Exercise Prescription Goal: Initial exercise prescription builds to 30-45 minutes a day of aerobic activity, 2-3 days per week.  Home exercise guidelines will be given to patient during program as part of exercise prescription that the participant will acknowledge.  Activity Barriers & Risk Stratification: Activity Barriers & Cardiac Risk Stratification - 04/06/18 1624      Activity Barriers & Cardiac Risk Stratification   Activity Barriers  Muscular Weakness;Incisional Pain;Deconditioning   Right leg pain   Cardiac Risk Stratification  High       6 Minute Walk: 6 Minute Walk    Row Name 04/06/18 1604         6 Minute Walk   Phase  Initial     Distance  1561 feet     Walk Time  6 minutes     MPH  2.96     METS  3.61     RPE  11     Perceived Dyspnea   0      VO2 Peak  12.63     Symptoms  Yes (comment)     Comments  Right leg pain     Resting HR  73 bpm     Resting BP  112/68     Resting Oxygen Saturation   99 %     Exercise Oxygen Saturation  during 6 min walk  99 %     Max Ex. HR  76 bpm     Max Ex. BP  120/72     2 Minute Post BP  118/68        Oxygen Initial Assessment:   Oxygen Re-Evaluation:   Oxygen Discharge (Final Oxygen Re-Evaluation):   Initial Exercise Prescription: Initial Exercise Prescription - 04/06/18 1500      Date of Initial Exercise RX and Referring Provider   Date  04/06/18    Referring Provider  Tilley/Krasowski    Expected Discharge Date  07/14/17      NuStep   Level  3    Minutes  10    METs  3.4      Arm Ergometer   Level  2    Watts  30    Minutes  10      Track   Laps  12    Minutes  10    METs  3      Prescription Details   Frequency (times per week)  3    Duration  Progress to 30 minutes of continuous aerobic without signs/symptoms of physical distress      Intensity   THRR 40-80% of Max Heartrate  64-128    Ratings of Perceived Exertion  11-13      Progression   Progression  Continue to progress workloads to maintain intensity without signs/symptoms of physical distress.      Resistance Training   Training Prescription  Yes    Weight  3lb    Reps  10-15       Perform Capillary Blood Glucose checks as needed.  Exercise Prescription Changes: Exercise Prescription Changes    Row Name 04/12/18 0800 04/26/18 1008 05/01/18 1637 05/05/18 1639 05/15/18 1554     Response to Exercise   Blood Pressure (Admit)  124/68  102/60  112/64  110/60  112/70   Blood Pressure (Exercise)  140/82  120/60  138/76  112/60  128/72  Blood Pressure (Exit)  120/80  110/74  112/64  102/60  118/70   Heart Rate (Admit)  66 bpm  78 bpm  78 bpm  72 bpm  79 bpm   Heart Rate (Exercise)  111 bpm  111 bpm  122 bpm  104 bpm  119 bpm   Heart Rate (Exit)  66 bpm  72 bpm  81 bpm  65 bpm  79 bpm   Rating  of Perceived Exertion (Exercise)  12  12  11  13  13    Perceived Dyspnea (Exercise)  0  0  0  0  0   Symptoms  None  None  None  None  None   Comments  Pt oriented to exercise prescription   -  None  None  None   Duration  Progress to 30 minutes of  aerobic without signs/symptoms of physical distress  Progress to 30 minutes of  aerobic without signs/symptoms of physical distress  Progress to 30 minutes of  aerobic without signs/symptoms of physical distress  Progress to 30 minutes of  aerobic without signs/symptoms of physical distress  Progress to 30 minutes of  aerobic without signs/symptoms of physical distress   Intensity  THRR unchanged  THRR unchanged  THRR unchanged  THRR unchanged  THRR unchanged     Progression   Progression  Continue to progress workloads to maintain intensity without signs/symptoms of physical distress.  Continue to progress workloads to maintain intensity without signs/symptoms of physical distress.  Continue to progress workloads to maintain intensity without signs/symptoms of physical distress.  Continue to progress workloads to maintain intensity without signs/symptoms of physical distress.  Continue to progress workloads to maintain intensity without signs/symptoms of physical distress.   Average METs  2.86  3.55  3.68  3.32  3.72     Resistance Training   Training Prescription  No  No  Yes  Yes  Yes   Weight  -  -  3lbs  3lbs  3lbs   Reps  -  -  10-15  10-15  10-15   Time  -  -  10 Minutes  10 Minutes  10 Minutes     Interval Training   Interval Training  -  -  No  No  No     NuStep   Level  3  3  3  3  3    Minutes  10  10  10  10  10    METs  2.4  3.1  3.9  3.1  4     Arm Ergometer   Level  2  2  2  2  2    Watts  25  25  25  25  25    Minutes  10  10  10  10  10    METs  -  -  2.3  1.5  2.4     Track   Laps  12  12  12  16  22    Minutes  10  10  10  10  10    METs  3  3  3   3.76  4.75     Home Exercise Plan   Plans to continue exercise at  -  -  -   -  Home (comment) Walking   Frequency  -  -  -  -  Add 2 additional days to program exercise sessions.   Initial Home Exercises Provided  -  -  -  -  04/24/18   Row Name 05/22/18 1000 06/05/18 1018           Response to Exercise   Blood Pressure (Admit)  112/60  120/78      Blood Pressure (Exercise)  126/78  120/72      Blood Pressure (Exit)  120/80  118/66      Heart Rate (Admit)  76 bpm  81 bpm      Heart Rate (Exercise)  120 bpm  108 bpm      Heart Rate (Exit)  86 bpm  80 bpm      Rating of Perceived Exertion (Exercise)  11  15      Perceived Dyspnea (Exercise)  0  0      Symptoms  None  None      Comments  None  Muscular Chest Pain       Duration  Progress to 30 minutes of  aerobic without signs/symptoms of physical distress  Progress to 30 minutes of  aerobic without signs/symptoms of physical distress      Intensity  THRR unchanged  THRR unchanged        Progression   Progression  Continue to progress workloads to maintain intensity without signs/symptoms of physical distress.  Continue to progress workloads to maintain intensity without signs/symptoms of physical distress.      Average METs  2.95  3.39        Resistance Training   Training Prescription  Yes  Yes      Weight  3lbs  3lbs      Reps  10-15  10-15      Time  10 Minutes  10 Minutes        Interval Training   Interval Training  No  No        NuStep   Level  3  3      SPM  75  75      Minutes  10  20      METs  3  3.3        Arm Ergometer   Level  2  -      Watts  25  -      Minutes  10  -      METs  2.1  -        Track   Laps  16  16      Minutes  10  10      METs  3.76  3.76        Home Exercise Plan   Plans to continue exercise at  Home (comment) Walking  Home (comment) Walking      Frequency  Add 2 additional days to program exercise sessions.  Add 2 additional days to program exercise sessions.      Initial Home Exercises Provided  04/24/18  04/24/18         Exercise Comments: Exercise  Comments    Row Name 04/12/18 0803 05/10/18 1641 06/01/18 1556 06/07/18 1019     Exercise Comments  Pt's first day of Cardiac Rehab. Pt responded to exercise workloads. Will continue to monitor and progress pt as tolerated.   Reviewed METs and goals with pt. Will follow up with pt regarding plans for home exercise program. Will continue to monitor and progress pt as tolerated.   Unable to review METs and Goals with pt. Pt currently on medical hold per physician order. Pt will return to rehab after 10 days. Will  follow up with pt regarding possible new copay for new year.   Pt returned from medical hold. Pt is still experiencing muscular pain. Pt is still able to walk for exercise at home. Pt also has new copay for year, will follow up with rehab staff on 06/09/2018 to determine if pt will drop from program early due to high copay.       Exercise Goals and Review: Exercise Goals    Row Name 04/06/18 1607             Exercise Goals   Increase Physical Activity  Yes       Intervention  Develop an individualized exercise prescription for aerobic and resistive training based on initial evaluation findings, risk stratification, comorbidities and participant's personal goals.;Provide advice, education, support and counseling about physical activity/exercise needs.       Expected Outcomes  Short Term: Attend rehab on a regular basis to increase amount of physical activity.       Intervention  Provide advice, education, support and counseling about physical activity/exercise needs.;Develop an individualized exercise prescription for aerobic and resistive training based on initial evaluation findings, risk stratification, comorbidities and participant's personal goals.       Expected Outcomes  Short Term: Increase workloads from initial exercise prescription for resistance, speed, and METs.       Able to understand and use rate of perceived exertion (RPE) scale  Yes       Intervention  Provide education  and explanation on how to use RPE scale       Expected Outcomes  Short Term: Able to use RPE daily in rehab to express subjective intensity level;Long Term:  Able to use RPE to guide intensity level when exercising independently       Knowledge and understanding of Target Heart Rate Range (THRR)  Yes       Intervention  Provide education and explanation of THRR including how the numbers were predicted and where they are located for reference       Expected Outcomes  Short Term: Able to state/look up THRR       Able to check pulse independently  Yes       Intervention  Provide education and demonstration on how to check pulse in carotid and radial arteries.;Review the importance of being able to check your own pulse for safety during independent exercise       Expected Outcomes  Short Term: Able to explain why pulse checking is important during independent exercise;Long Term: Able to check pulse independently and accurately       Understanding of Exercise Prescription  Yes       Intervention  Provide education, explanation, and written materials on patient's individual exercise prescription       Expected Outcomes  Short Term: Able to explain program exercise prescription;Long Term: Able to explain home exercise prescription to exercise independently          Exercise Goals Re-Evaluation : Exercise Goals Re-Evaluation    Row Name 05/10/18 1642             Exercise Goal Re-Evaluation   Exercise Goals Review  Increase Physical Activity;Understanding of Exercise Prescription       Comments  Pt is continuing to respond well to prescription and workloads. Pt has been trouble with transportation, but is still making an effort to return to exercise.        Expected Outcomes  Pt will continue to increase cardiovascular fitness and return  to regular activities. Will follow up with pt regarding home exercise plan.           Discharge Exercise Prescription (Final Exercise Prescription  Changes): Exercise Prescription Changes - 06/05/18 1018      Response to Exercise   Blood Pressure (Admit)  120/78    Blood Pressure (Exercise)  120/72    Blood Pressure (Exit)  118/66    Heart Rate (Admit)  81 bpm    Heart Rate (Exercise)  108 bpm    Heart Rate (Exit)  80 bpm    Rating of Perceived Exertion (Exercise)  15    Perceived Dyspnea (Exercise)  0    Symptoms  None    Comments  Muscular Chest Pain     Duration  Progress to 30 minutes of  aerobic without signs/symptoms of physical distress    Intensity  THRR unchanged      Progression   Progression  Continue to progress workloads to maintain intensity without signs/symptoms of physical distress.    Average METs  3.39      Resistance Training   Training Prescription  Yes    Weight  3lbs    Reps  10-15    Time  10 Minutes      Interval Training   Interval Training  No      NuStep   Level  3    SPM  75    Minutes  20    METs  3.3      Track   Laps  16    Minutes  10    METs  3.76      Home Exercise Plan   Plans to continue exercise at  Home (comment)   Walking   Frequency  Add 2 additional days to program exercise sessions.    Initial Home Exercises Provided  04/24/18       Nutrition:  Target Goals: Understanding of nutrition guidelines, daily intake of sodium 1500mg , cholesterol 200mg , calories 30% from fat and 7% or less from saturated fats, daily to have 5 or more servings of fruits and vegetables.  Biometrics: Pre Biometrics - 04/06/18 1602      Pre Biometrics   Height  5\' 2"  (1.575 m)    Weight  63.2 kg    Waist Circumference  36 inches    Hip Circumference  39 inches    Waist to Hip Ratio  0.92 %    BMI (Calculated)  25.48    Triceps Skinfold  30 mm    % Body Fat  38.3 %    Grip Strength  25 kg    Flexibility  13 in    Single Leg Stand  3.03 seconds        Nutrition Therapy Plan and Nutrition Goals: Nutrition Therapy & Goals - 04/07/18 1046      Nutrition Therapy   Diet  heart  healthy      Personal Nutrition Goals   Nutrition Goal  Pt to identify and limit food sources of saturated fat, trans fat, refined carbohydrates and sodium    Personal Goal #2  Pt able to name foods that affect blood glucose      Intervention Plan   Intervention  Prescribe, educate and counsel regarding individualized specific dietary modifications aiming towards targeted core components such as weight, hypertension, lipid management, diabetes, heart failure and other comorbidities.    Expected Outcomes  Short Term Goal: Understand basic principles of dietary content, such as calories, fat,  sodium, cholesterol and nutrients.;Long Term Goal: Adherence to prescribed nutrition plan.       Nutrition Assessments: Nutrition Assessments - 04/07/18 1055      MEDFICTS Scores   Pre Score  24       Nutrition Goals Re-Evaluation: Nutrition Goals Re-Evaluation    Row Name 04/07/18 1046             Goals   Current Weight  139 lb 5.3 oz (63.2 kg)          Nutrition Goals Re-Evaluation: Nutrition Goals Re-Evaluation    Row Name 04/07/18 1046             Goals   Current Weight  139 lb 5.3 oz (63.2 kg)          Nutrition Goals Discharge (Final Nutrition Goals Re-Evaluation): Nutrition Goals Re-Evaluation - 04/07/18 1046      Goals   Current Weight  139 lb 5.3 oz (63.2 kg)       Psychosocial: Target Goals: Acknowledge presence or absence of significant depression and/or stress, maximize coping skills, provide positive support system. Participant is able to verbalize types and ability to use techniques and skills needed for reducing stress and depression.  Initial Review & Psychosocial Screening: Initial Psych Review & Screening - 04/06/18 1615      Initial Review   Current issues with  Current Stress Concerns    Source of Stress Concerns  Occupation;Financial    Comments  Julane is on leave from work.  She had to move in with her mother due to loss of income.        Family Dynamics   Good Support System?  Yes   Her mother is a source of support for her.      Barriers   Psychosocial barriers to participate in program  The patient should benefit from training in stress management and relaxation.      Screening Interventions   Interventions  Encouraged to exercise    Expected Outcomes  Short Term goal: Utilizing psychosocial counselor, staff and physician to assist with identification of specific Stressors or current issues interfering with healing process. Setting desired goal for each stressor or current issue identified.;Long Term Goal: Stressors or current issues are controlled or eliminated.       Quality of Life Scores: Quality of Life - 04/06/18 1616      Quality of Life   Select  Quality of Life      Quality of Life Scores   Health/Function Pre  24.57 %    Socioeconomic Pre  22.69 %    Psych/Spiritual Pre  25.07 %    Family Pre  27 %    GLOBAL Pre  24.59 %      Scores of 19 and below usually indicate a poorer quality of life in these areas.  A difference of  2-3 points is a clinically meaningful difference.  A difference of 2-3 points in the total score of the Quality of Life Index has been associated with significant improvement in overall quality of life, self-image, physical symptoms, and general health in studies assessing change in quality of life.  PHQ-9: Recent Review Flowsheet Data    Depression screen [facility] 2/9 04/12/2018 01/02/2018 12/15/2017 11/14/2017 11/04/2017   Decreased Interest 0 0 0 0 0   Down, Depressed, Hopeless 0 0 0 0 0   PHQ - 2 Score 0 0 0 0 0     Interpretation of Total Score  Total Score  Depression Severity:  1-4 = Minimal depression, 5-9 = Mild depression, 10-14 = Moderate depression, 15-19 = Moderately severe depression, 20-27 = Severe depression   Psychosocial Evaluation and Intervention: Psychosocial Evaluation - 04/12/18 0829      Psychosocial Evaluation & Interventions   Interventions  Encouraged to  exercise with the program and follow exercise prescription;Stress management education;Relaxation education    Comments  Vocational Rehab paperwork given to patient. No other psychosocial interventions warranted at this time. Pt enjoys bowling, watching movies, and skating.     Expected Outcomes  Pam will report reduction in her stress levels.     Continue Psychosocial Services   Follow up required by staff       Psychosocial Re-Evaluation: Psychosocial Re-Evaluation    Row Name 04/17/18 1610 05/10/18 1700 06/07/18 1645         Psychosocial Re-Evaluation   Current issues with  Current Stress Concerns  Current Stress Concerns  Current Stress Concerns     Comments  Vocational Rehab form given to patient.  She plans to bring it back soon.  Vocational Rehab forms has been faxed to vocational counselor. Unfortunately, Pam is currently having transportation issues.   Pam continues to work through various stressors.      Expected Outcomes  Shevaun will report reduced stress levels.   Tisha will report the ability to manage her stressors.   Allyne will report the ability to manage her stressors.      Interventions  Relaxation education;Stress management education;Encouraged to attend Cardiac Rehabilitation for the exercise  Relaxation education;Stress management education;Encouraged to attend Cardiac Rehabilitation for the exercise  Relaxation education;Stress management education;Encouraged to attend Cardiac Rehabilitation for the exercise     Continue Psychosocial Services   Follow up required by staff  -  Follow up required by staff        Psychosocial Discharge (Final Psychosocial Re-Evaluation): Psychosocial Re-Evaluation - 06/07/18 1645      Psychosocial Re-Evaluation   Current issues with  Current Stress Concerns    Comments  Pam continues to work through various stressors.     Expected Outcomes  Luddie will report the ability to manage her stressors.     Interventions  Relaxation  education;Stress management education;Encouraged to attend Cardiac Rehabilitation for the exercise    Continue Psychosocial Services   Follow up required by staff       Vocational Rehabilitation: Provide vocational rehab assistance to qualifying candidates.   Vocational Rehab Evaluation & Intervention: Vocational Rehab - 05/10/18 1701      Vocational Rehab Re-[facility]   Comments  Form faxed 05/05/18.       Education: Education Goals: Education classes will be provided on a weekly basis, covering required topics. Participant will state understanding/return demonstration of topics presented.  Learning Barriers/Preferences: Learning Barriers/Preferences - 04/06/18 1610      Learning Barriers/Preferences   Learning Barriers  None    Learning Preferences  Skilled Demonstration;Pictoral;Video       Education Topics: Count Your Pulse:  -Group instruction provided by verbal instruction, demonstration, patient participation and written materials to support subject.  Instructors address importance of being able to find your pulse and how to count your pulse when at home without a heart monitor.  Patients get hands on experience counting their pulse with staff help and individually.   Heart Attack, Angina, and Risk Factor Modification:  -Group instruction provided by verbal instruction, video, and written materials to support subject.  Instructors address signs and symptoms of angina and  heart attacks.    Also discuss risk factors for heart disease and how to make changes to improve heart health risk factors.   Functional Fitness:  -Group instruction provided by verbal instruction, demonstration, patient participation, and written materials to support subject.  Instructors address safety measures for doing things around the house.  Discuss how to get up and down off the floor, how to pick things up properly, how to safely get out of a chair without assistance, and balance training.    CARDI[facility] RE[facility]AB [facility]  Date  04/14/18  Instruction Review Code  2- Demonstrated Understanding      Meditation and Mindfulness:  -Group instruction provided by verbal instruction, patient participation, and written materials to support subject.  Instructor addresses importance of mindfulness and meditation practice to help reduce stress and improve awareness.  Instructor also leads participants through a meditation exercise.    Stretching for Flexibility and Mobility:  -Group instruction provided by verbal instruction, patient participation, and written materials to support subject.  Instructors lead participants through series of stretches that are designed to increase flexibility thus improving mobility.  These stretches are additional exercise for major muscle groups that are typically performed during regular warm up and cool down.   Hands Only CPR:  -Group verbal, video, and participation provides a basic overview of AHA guidelines for community CPR. Role-play of emergencies allow participants the opportunity to practice calling for help and chest compression technique with discussion of AED use.   Hypertension: -Group verbal and written instruction that provides a basic overview of hypertension including the most recent diagnostic guidelines, risk factor reduction with self-care instructions and medication management.   CARDIAC REHAB PHASE II EXERCISE [facility]  Date  04/19/18  Educator  RN  Instruction Review Code  2- Demonstrated Understanding       Nutrition I class: Heart Healthy Eating:  -Group instruction provided by PowerPoint slides, verbal discussion, and written materials to support subject matter. The instructor gives an explanation and review of the Therapeutic Lifestyle Changes diet recommendations, which includes a discussion on lipid goals, dietary  fat, sodium, fiber, plant stanol/sterol esters, sugar, and the components of a well-balanced, healthy diet.   Nutrition II class: Lifestyle Skills:  -Group instruction provided by PowerPoint slides, verbal discussion, and written materials to support subject matter. The instructor gives an explanation and review of label reading, grocery shopping for heart health, heart healthy recipe modifications, and ways to make healthier choices when eating out.   Diabetes Question & Answer:  -Group instruction provided by PowerPoint slides, verbal discussion, and written materials to support subject matter. The instructor gives an explanation and review of diabetes co-morbidities, pre- and post-prandial blood glucose goals, pre-exercise blood glucose goals, signs, symptoms, and treatment of hypoglycemia and hyperglycemia, and foot care basics.   Diabetes Blitz:  -Group instruction provided by PowerPoint slides, verbal discussion, and written materials to support subject matter. The instructor gives an explanation and review of the physiology behind type 1 and type 2 diabetes, diabetes medications and rational behind using different medications, pre- and post-prandial blood glucose recommendations and Hemoglobin A1c goals, diabetes diet, and exercise including blood glucose guidelines for exercising safely.    Portion Distortion:  -Group instruction provided by PowerPoint slides, verbal discussion, written materials, and food models to support subject matter. The instructor gives an explanation of serving size versus portion size, changes in portions  sizes over the last 20 years, and what consists of a serving from each food group.   Stress Management:  -Group instruction provided by verbal instruction, video, and written materials to support subject matter.  Instructors review role of stress in heart disease and how to cope with stress positively.     Exercising on Your Own:  -Group instruction provided  by verbal instruction, power point, and written materials to support subject.  Instructors discuss benefits of exercise, components of exercise, frequency and intensity of exercise, and end points for exercise.  Also discuss use of nitroglycerin and activating EMS.  Review options of places to exercise outside of rehab.  Review guidelines for sex with heart disease.   Cardiac Drugs I:  -Group instruction provided by verbal instruction and written materials to support subject.  Instructor reviews cardiac drug classes: antiplatelets, anticoagulants, beta blockers, and statins.  Instructor discusses reasons, side effects, and lifestyle considerations for each drug class.   CARDIAC REHAB PHASE II EXERCISE from 05/03/2018 in [facility] CARDIAC REHAB  Date  04/12/18  Educator  Pharmacist  Instruction Review Code  2- Demonstrated Understanding      Cardiac Drugs II:  -Group instruction provided by verbal instruction and written materials to support subject.  Instructor reviews cardiac drug classes: angiotensin converting enzyme inhibitors (ACE-I), angiotensin II receptor blockers (ARBs), nitrates, and calcium channel blockers.  Instructor discusses reasons, side effects, and lifestyle considerations for each drug class.   CARDIAC REHAB PHASE II EXERCISE from 05/03/2018 in [facility] CARDIAC REHAB  Date  05/03/18  Educator  Pharmacist   Instruction Review Code  2- Demonstrated Understanding      Anatomy and Physiology of the Circulatory System:  Group verbal and written instruction and models provide basic cardiac anatomy and physiology, with the coronary electrical and arterial systems. Review of: AMI, Angina, Valve disease, Heart Failure, Peripheral Artery Disease, Cardiac Arrhythmia, Pacemakers, and the ICD.   CARDIAC REHAB PHASE II EXERCISE from 05/03/2018 in [facility] CARDIAC REHAB  Date  04/26/18  Educator  RN  Instruction Review Code  2-  Demonstrated Understanding      Other Education:  -Group or individual verbal, written, or video instructions that support the educational goals of the cardiac rehab program.   Holiday Eating Survival Tips:  -Group instruction provided by PowerPoint slides, verbal discussion, and written materials to support subject matter. The instructor gives patients tips, tricks, and techniques to help them not only survive but enjoy the holidays despite the onslaught of food that accompanies the holidays.   Knowledge Questionnaire Score: Knowledge Questionnaire Score - 04/06/18 1616      Knowledge Questionnaire Score   Pre Score  17/24       Core Components/Risk Factors/Patient Goals at Admission: Personal Goals and Risk Factors at Admission - 04/06/18 1608      Core Components/Risk Factors/Patient Goals on Admission    Weight Management  Weight Maintenance;Yes    Intervention  Weight Management: Develop a combined nutrition and exercise program designed to reach desired caloric intake, while maintaining appropriate intake of nutrient and fiber, sodium and fats, and appropriate energy expenditure required for the weight goal.;Weight Management: Provide education and appropriate resources to help participant work on and attain dietary goals.    Admit Weight  137 lb 5.6 oz (62.3 kg)    Expected Outcomes  Weight Maintenance: Understanding of the daily nutrition guidelines, which includes 25-35% calories from fat, 7% or less cal from  saturated fats, less than 200mg  cholesterol, less than 1.5gm of sodium, & 5 or more servings of fruits and vegetables daily;Short Term: Continue to assess and modify interventions until short term weight is achieved;Long Term: Adherence to nutrition and physical activity/exercise program aimed toward attainment of established weight goal;Understanding recommendations for meals to include 15-35% energy as protein, 25-35% energy from fat, 35-60% energy from carbohydrates, less  than 200mg  of dietary cholesterol, 20-35 gm of total fiber daily;Understanding of distribution of calorie intake throughout the day with the consumption of 4-5 meals/snacks    Hypertension  Yes    Intervention  Provide education on lifestyle modifcations including regular physical activity/exercise, weight management, moderate sodium restriction and increased consumption of fresh fruit, vegetables, and low fat dairy, alcohol moderation, and smoking cessation.;Monitor prescription use compliance.    Expected Outcomes  Short Term: Continued assessment and intervention until BP is < 140/40mm HG in hypertensive participants. < 130/48mm HG in hypertensive participants with diabetes, heart failure or chronic kidney disease.;Long Term: Maintenance of blood pressure at goal levels.    Lipids  Yes    Intervention  Provide education and support for participant on nutrition & aerobic/resistive exercise along with prescribed medications to achieve LDL 70mg , HDL >40mg .    Expected Outcomes  Short Term: Participant states understanding of desired cholesterol values and is compliant with medications prescribed. Participant is following exercise prescription and nutrition guidelines.;Long Term: Cholesterol controlled with medications as prescribed, with individualized exercise RX and with personalized nutrition plan. Value goals: LDL < 70mg , HDL > 40 mg.    Stress  Yes    Intervention  Offer individual and/or small group education and counseling on adjustment to heart disease, stress management and health-related lifestyle change. Teach and support self-help strategies.;Refer participants experiencing significant psychosocial distress to appropriate mental health specialists for further evaluation and treatment. When possible, include family members and significant others in education/counseling sessions.       Core Components/Risk Factors/Patient Goals Review:  Goals and Risk Factor Review    Row Name 04/12/18 2841  04/17/18 0940 05/10/18 1702 06/07/18 1646       Core Components/Risk Factors/Patient Goals Review   Personal Goals Review  Stress;Hypertension;Lipids;Weight Management/Obesity  Stress;Hypertension;Lipids;Weight Management/Obesity  Stress;Hypertension;Lipids;Weight Management/Obesity  Stress;Hypertension;Lipids;Weight Management/Obesity    Review  Pt willing to participate in CR exercise. Pt would like to increase her strength.   Pt with multiple CAD RFs.  She started exercise recently and is tolerating it well. VSS.  Pt with multiple CAD RFs.  She is tolerating exercise well.  Pam feels that she is increasing her strength.   Pt with multiple CAD RFs. Pam has returned from medical hold. Pt is still experiencing muscular pain. Pt is still able to participate in CR exercise with changes in workloads. However pt has new insurance and copay. Pam will f/u with CR staff on 06/09/2018 to determine if she will be able to manage copay or drop from the program.     Expected Outcomes  Pt will continue to participate in CR exercise, nutrition, and lifestyle modification.   Pt will continue to participate in CR exercise, nutrition, and lifestyle modification.   Pt will continue to participate in CR exercise, nutrition, and lifestyle modification.   Pt will continue to participate in CR exercise, nutrition, and lifestyle modification.        Core Components/Risk Factors/Patient Goals at Discharge (Final Review):  Goals and Risk Factor Review - 06/07/18 1646      Core Components/Risk Factors/Patient Goals  Review   Personal Goals Review  Stress;Hypertension;Lipids;Weight Management/Obesity    Review  Pt with multiple CAD RFs. Pam has returned from medical hold. Pt is still experiencing muscular pain. Pt is still able to participate in CR exercise with changes in workloads. However pt has new insurance and copay. Pam will f/u with CR staff on 06/09/2018 to determine if she will be able to manage copay or drop from the  program.     Expected Outcomes  Pt will continue to participate in CR exercise, nutrition, and lifestyle modification.        ITP Comments: ITP Comments    Row Name 04/06/18 1612 04/17/18 0930 05/10/18 1654 06/07/18 1643     ITP Comments  Dr. Armanda Magic Medical Director  30 Day ITP Review. Pt has started exercise recently and is tolerating it well.   30 Day ITP Review. Pam has been tolerating exercise well. She feels that she is increasing her strength.   30 Day ITP Review. Pt returned from medical hold. Pt is still experiencing muscular pain. Pt is still able to participate in CR exercise with changes in workloads. However pt has new insurance and copay. Pam will f/u with CR staff on 06/09/2018 to determine if she will be able to manage copay or drop from the program.        Comments: See ITP Comments.

## 2018-06-09 ENCOUNTER — Encounter (HOSPITAL_COMMUNITY)
Admission: RE | Admit: 2018-06-09 | Discharge: 2018-06-09 | Disposition: A | Discharge status: Home / Self Care | Payer: BLUE CROSS/BLUE SHIELD | Source: Ambulatory Visit | Attending: Cardiology | Admitting: Cardiology

## 2018-06-09 ENCOUNTER — Encounter (HOSPITAL_COMMUNITY): Payer: BLUE CROSS/BLUE SHIELD

## 2018-06-09 DIAGNOSIS — Z951 Presence of aortocoronary bypass graft: Secondary | ICD-10-CM | POA: Diagnosis not present

## 2018-06-12 ENCOUNTER — Encounter (HOSPITAL_COMMUNITY): Payer: BLUE CROSS/BLUE SHIELD

## 2018-06-14 ENCOUNTER — Encounter (HOSPITAL_COMMUNITY)
Admission: RE | Admit: 2018-06-14 | Discharge: 2018-06-14 | Disposition: A | Discharge status: Home / Self Care | Payer: BLUE CROSS/BLUE SHIELD | Source: Ambulatory Visit | Attending: Cardiology | Admitting: Cardiology

## 2018-06-14 ENCOUNTER — Encounter (HOSPITAL_COMMUNITY): Payer: BLUE CROSS/BLUE SHIELD

## 2018-06-14 ENCOUNTER — Ambulatory Visit (INDEPENDENT_AMBULATORY_CARE_PROVIDER_SITE_OTHER): Payer: BLUE CROSS/BLUE SHIELD | Admitting: Cardiology

## 2018-06-14 VITALS — BP 124/64 | HR 66 | Ht 63.0 in | Wt 141.0 lb

## 2018-06-14 DIAGNOSIS — E785 Hyperlipidemia, unspecified: Secondary | ICD-10-CM | POA: Diagnosis not present

## 2018-06-14 DIAGNOSIS — Z951 Presence of aortocoronary bypass graft: Secondary | ICD-10-CM

## 2018-06-14 DIAGNOSIS — R400 Somnolence: Secondary | ICD-10-CM

## 2018-06-14 DIAGNOSIS — I25759 Atherosclerosis of native coronary artery of transplanted heart with unspecified angina pectoris: Secondary | ICD-10-CM

## 2018-06-14 DIAGNOSIS — I10 Essential (primary) hypertension: Secondary | ICD-10-CM | POA: Diagnosis not present

## 2018-06-14 NOTE — Patient Instructions (Addendum)
Medication Instructions:  Your physician recommends that you continue on your current medications as directed. Please refer to the Current Medication list given to you today.  If you need a refill on your cardiac medications before your next appointment, please call your pharmacy.   Lab work: None.  If you have labs (blood work) drawn today and your tests are completely normal, you will receive your results only by: Marland Kitchen MyChart Message (if you have MyChart) OR . A paper copy in the mail If you have any lab test that is abnormal or we need to change your treatment, we will call you to review the results.  Testing/Procedures: Your physician has recommended that you have a sleep study. This test records several body functions during sleep, including: brain activity, eye movement, oxygen and carbon dioxide blood levels, heart rate and rhythm, breathing rate and rhythm, the flow of air through your mouth and nose, snoring, body muscle movements, and chest and belly movement.     Follow-Up: At [facility], you and your health needs are our priority.  As part of our continuing mission to provide you with exceptional heart care, we have created designated Provider Care Teams.  These Care Teams include your primary Cardiologist (physician) and Advanced Practice Providers (APPs -  Physician Assistants and Nurse Practitioners) who all work together to provide you with the care you need, when you need it. You will need a follow up appointment in 3 months.  Please call our office 2 months in advance to schedule this appointment.  You may see No primary care provider on file. or another member of our [employer] Provider Team in [place]: Norman Herrlich, MD . Belva Crome, MD  Any Other Special Instructions Will Be Listed Below (If Applicable).

## 2018-06-14 NOTE — Progress Notes (Signed)
Cardiology Office Note:    Date:  06/14/2018   ID:  Diana [name], DOB 04/07/1956, MRN [id number]  PCP:  Porfirio OarJeffery, Chelle, PA  Cardiologist:  Gypsy Balsamobert Phoua Hoadley, MD    Referring MD: Porfirio OarJeffery, Chelle, PA   Chief Complaint  Patient presents with  . Follow-up  Still having chest pain  History of Present Illness:    Diana [name] is a 63 y.o. female with coronary artery disease status post coronary artery bypass graft.  Recently she seen me because of recurrent chest pain however this pain is completely different it is worse with moving her shoulder worse with pressing chest wall.  Sternum is stable she did have a stress test to make sure there is no inducible ischemia and that came normal.  Past Medical History:  Diagnosis Date  . Allergy   . Arthritis    feet  . Bronchitis 10/30/2016  . Carpal tunnel syndrome   . Coronary artery disease   . GERD (gastroesophageal reflux disease)   . Hyperlipidemia   . Hypertension   . Non-ST elevation (NSTEMI) myocardial infarction (HCC) 10/30/2016  . NSTEMI (non-ST elevated myocardial infarction) (HCC) 10/30/2016    Past Surgical History:  Procedure Laterality Date  . CARPAL TUNNEL RELEASE Left 02/12/2016   Procedure: LEFT CARPAL TUNNEL RELEASE;  Surgeon: Cindee SaltGary Kuzma, MD;  Location: [facility];  Service: Orthopedics;  Laterality: Left;  FAB  . CORONARY ARTERY BYPASS GRAFT N/A 01/10/2018   Procedure: CORONARY ARTERY BYPASS GRAFTING (CABG) TIMES 4, USING LEFT INTERNAL MAMMARY ARTERY TO OM1  AND ENDOSCOPICALLY HARVESTED RIGHT SAPHENOUS VEIN TO DIAGONAL, AND SEQUENTIALLY TO OM2 AND DISTAL CIRCUMFLEX;  Surgeon: Delight OvensGerhardt, Edward B, MD;  Location: [facility] OR;  Service: Open Heart Surgery;  Laterality: N/A;  . LEFT HEART CATH AND CORONARY ANGIOGRAPHY N/A 01/05/2018   Procedure: LEFT HEART CATH AND CORONARY ANGIOGRAPHY;  Surgeon: Lennette BihariKelly, Thomas A, MD;  Location: [facility] INVASIVE CV LAB;  Service: Cardiovascular;  Laterality: N/A;  . TEE WITHOUT CARDIOVERSION  N/A 01/10/2018   Procedure: TRANSESOPHAGEAL ECHOCARDIOGRAM (TEE);  Surgeon: Delight OvensGerhardt, Edward B, MD;  [facility];  Service: Open Heart Surgery;  Laterality: N/A;  . TONSILLECTOMY    . TUBAL LIGATION    . WRIST SURGERY Right     Current Medications: Current Meds  Medication Sig  . ALPRAZolam (XANAX) 0.5 MG tablet Take 1 tablet (0.5 mg total) by mouth 2 (two) times daily as needed for anxiety.  Marland Kitchen. aspirin EC 81 MG tablet Take 81 mg by mouth daily.  Marland Kitchen. atorvastatin (LIPITOR) 80 MG tablet Take 0.5 tablets (40 mg total) by mouth daily at 6 PM.  . Metoprolol Tartrate 75 MG TABS Take 75 mg by mouth every 12 (twelve) hours.  . nitroGLYCERIN (NITROSTAT) 0.4 MG SL tablet Place 1 tablet (0.4 mg total) under the tongue every 5 (five) minutes as needed for chest pain.  Marland Kitchen. omeprazole (PRILOSEC) 20 MG capsule Take 1 capsule (20 mg total) by mouth daily.  . pregabalin (LYRICA) 150 MG capsule Take 150 mg by mouth 2 (two) times daily.  . traZODone (DESYREL) 50 MG tablet Take 25-50 mg by mouth at bedtime as needed for sleep.     Allergies:   Contrast media [iodinated diagnostic agents]; Penicillins; and Codeine   Social History   Socioeconomic History  . Marital status: Single    Spouse name: Alcario Droughtrica  . Number of children: 2  . Years of education: 12th grade  . Highest education level: Not on file  Occupational History  . Occupation: Warden/rangerfloor tech    Employer: [employer]  Social Needs  . Financial resource strain: Not on file  . Food insecurity:    Worry: Not on file    Inability: Not on file  . Transportation needs:    Medical: Not on file    Non-medical: Not on file  Tobacco Use  . Smoking status: Never Smoker  . Smokeless tobacco: Never Used  Substance and Sexual Activity  . Alcohol use: No  . Drug use: No  . Sexual activity: Not Currently  Lifestyle  . Physical activity:    Days per week: Not on file    Minutes per session: Not on file  . Stress: Not on file  Relationships  .  Social connections:    Talks on phone: Not on file    Gets together: Not on file    Attends religious service: Not on file    Active member of club or organization: Not on file    Attends meetings of clubs or organizations: Not on file    Relationship status: Not on file  Other Topics Concern  . Not on file  Social History Narrative   Divorced from her husband. She has two adult sons from that union . 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   Married Alcario DroughtErica 05/2014.     Family History: The patient's family history includes Brain cancer in her sister; HIV in her sister; Healthy in her brother; Hypertension in her mother; Liver disease in her brother; Thyroid disease in her mother. ROS:   Please see the history of present illness.    All 14 point review of systems negative except as described per history of present illness  EKGs/Labs/Other Studies Reviewed:      Recent Labs: 01/11/2018: Magnesium 1.9 01/16/2018: BUN 10; Creatinine, Ser 0.67; Hemoglobin 9.2; Platelets 241; Potassium 3.9; Sodium 135 03/10/2018: ALT 49  Recent Lipid Panel    Component Value Date/Time   CHOL 128 03/10/2018 1615   TRIG 168 (H) 03/10/2018 1615   HDL 44 03/10/2018 1615   CHOLHDL 2.9 03/10/2018 1615   CHOLHDL 3.0 10/31/2016 0129   VLDL 10 10/31/2016 0129   LDLCALC 50 03/10/2018 1615    Physical Exam:    VS:  BP 124/64   Pulse 66   Ht 5\' 3"  (1.6 m)   Wt 141 lb (64 kg)   SpO2 98%   BMI 24.98 kg/m     Wt Readings from Last 3 Encounters:  06/14/18 141 lb (64 kg)  05/30/18 141 lb (64 kg)  05/26/18 141 lb 9.6 oz (64.2 kg)     GEN:  Well nourished, well developed in no acute distress HEENT: Normal NECK: No JVD; No carotid bruits LYMPHATICS: No lymphadenopathy CARDIAC: RRR, no murmurs, no rubs, no gallops RESPIRATORY:  Clear to auscultation without rales, wheezing or rhonchi  ABDOMEN: Soft, non-tender, non-distended MUSCULOSKELETAL:  No edema; No  deformity  SKIN: Warm and dry LOWER EXTREMITIES: no swelling NEUROLOGIC:  Alert and oriented x 3 PSYCHIATRIC:  Normal affect   ASSESSMENT:    1. S/P CABG x 4   2. 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 Benign essential HTN   4. Hyperlipidemia, unspecified hyperlipidemia type    PLAN:    In order of problems listed above:  1. Coronary artery disease doing well from that point review stress test negative.  2. Chest pain appears to be musculoskeletal I asked him to try muscle relaxant as well as some nonsteroidal anti-inflammatory medication as well as some local measures if this does not help she may require an MRI of her spine. 3. Benign essential hypertension blood pressure appears to be well controlled continue present management. 4. Dyslipidemia we will continue with statin. 5. Excessive daytime somnolence with snoring I will ask her to have sleep study.   Medication Adjustments/Labs and Tests Ordered: Current medicines are reviewed at length with the patient today.  Concerns regarding medicines are outlined above.  No orders of the defined types were placed in this encounter.  Medication changes: No orders of the defined types were placed in this encounter.   Signed, Georgeanna Lea, MD, [facility] 06/14/2018 2:17 PM    [facility]

## 2018-06-16 ENCOUNTER — Encounter (HOSPITAL_COMMUNITY): Payer: BLUE CROSS/BLUE SHIELD

## 2018-06-16 ENCOUNTER — Encounter (HOSPITAL_COMMUNITY)
Admission: RE | Admit: 2018-06-16 | Discharge: 2018-06-16 | Disposition: A | Discharge status: Home / Self Care | Payer: BLUE CROSS/BLUE SHIELD | Source: Ambulatory Visit | Attending: Cardiology | Admitting: Cardiology

## 2018-06-16 DIAGNOSIS — Z951 Presence of aortocoronary bypass graft: Secondary | ICD-10-CM

## 2018-06-19 ENCOUNTER — Encounter (HOSPITAL_COMMUNITY)
Admission: RE | Admit: 2018-06-19 | Discharge: 2018-06-19 | Disposition: A | Discharge status: Home / Self Care | Payer: BLUE CROSS/BLUE SHIELD | Source: Ambulatory Visit | Attending: Cardiology | Admitting: Cardiology

## 2018-06-19 ENCOUNTER — Encounter (HOSPITAL_COMMUNITY): Payer: BLUE CROSS/BLUE SHIELD

## 2018-06-19 VITALS — Ht 62.0 in | Wt 142.0 lb

## 2018-06-19 DIAGNOSIS — Z951 Presence of aortocoronary bypass graft: Secondary | ICD-10-CM | POA: Diagnosis not present

## 2018-06-20 ENCOUNTER — Telehealth: Payer: Self-pay | Admitting: *Deleted

## 2018-06-20 NOTE — Telephone Encounter (Signed)
-----   Message from Lita MainsHayley R Bowman, RN sent at 06/16/2018  8:31 AM EST ----- Regarding: Please pre cert Please precert sleep study for excessive daytime somnolence per Dr. Bing MatterKrasowski.   Thanks  Mayme GentaHayley, RN

## 2018-06-20 NOTE — Telephone Encounter (Signed)
Staff message sent to [facility] denied in lab sleep study. Approved HST. Order and schedule. Auth # [id number]. Valid dates 06/20/18 to 08/18/18.

## 2018-06-21 ENCOUNTER — Encounter (HOSPITAL_COMMUNITY): Payer: BLUE CROSS/BLUE SHIELD

## 2018-06-21 NOTE — Telephone Encounter (Signed)
Patient informed of home sleep study appointment on 06/30/2018 at 12 pm.

## 2018-06-22 ENCOUNTER — Other Ambulatory Visit: Payer: Self-pay | Admitting: Family Medicine

## 2018-06-22 NOTE — Addendum Note (Signed)
Addended by: Lita Mains on: 06/22/2018 03:06 PM   Modules accepted: Orders

## 2018-06-23 ENCOUNTER — Encounter (HOSPITAL_COMMUNITY): Payer: BLUE CROSS/BLUE SHIELD

## 2018-06-23 ENCOUNTER — Encounter (HOSPITAL_COMMUNITY)
Admission: RE | Admit: 2018-06-23 | Discharge: 2018-06-23 | Disposition: A | Discharge status: Home / Self Care | Payer: BLUE CROSS/BLUE SHIELD | Source: Ambulatory Visit | Attending: Cardiology | Admitting: Cardiology

## 2018-06-23 DIAGNOSIS — Z951 Presence of aortocoronary bypass graft: Secondary | ICD-10-CM

## 2018-06-26 ENCOUNTER — Encounter (HOSPITAL_COMMUNITY)
Admission: RE | Admit: 2018-06-26 | Discharge: 2018-06-26 | Disposition: A | Discharge status: Home / Self Care | Payer: BLUE CROSS/BLUE SHIELD | Source: Ambulatory Visit | Attending: Cardiology | Admitting: Cardiology

## 2018-06-26 ENCOUNTER — Encounter (HOSPITAL_COMMUNITY): Payer: BLUE CROSS/BLUE SHIELD

## 2018-06-26 DIAGNOSIS — Z951 Presence of aortocoronary bypass graft: Secondary | ICD-10-CM

## 2018-06-28 ENCOUNTER — Encounter (HOSPITAL_COMMUNITY): Payer: BLUE CROSS/BLUE SHIELD

## 2018-06-30 ENCOUNTER — Encounter (HOSPITAL_COMMUNITY): Payer: BLUE CROSS/BLUE SHIELD

## 2018-06-30 ENCOUNTER — Encounter (HOSPITAL_BASED_OUTPATIENT_CLINIC_OR_DEPARTMENT_OTHER): Payer: Self-pay

## 2018-06-30 ENCOUNTER — Encounter (HOSPITAL_COMMUNITY)
Admission: RE | Admit: 2018-06-30 | Discharge: 2018-06-30 | Disposition: A | Discharge status: Home / Self Care | Payer: BLUE CROSS/BLUE SHIELD | Source: Ambulatory Visit | Attending: Cardiology | Admitting: Cardiology

## 2018-06-30 DIAGNOSIS — Z951 Presence of aortocoronary bypass graft: Secondary | ICD-10-CM

## 2018-07-03 ENCOUNTER — Encounter (HOSPITAL_COMMUNITY): Payer: BLUE CROSS/BLUE SHIELD

## 2018-07-03 NOTE — Progress Notes (Signed)
Diana MuscaPamela M [name] 63 y.o. female Nutrition Note Spoke with pt. Nutrition Plan and Nutrition Survey goals reviewed with pt. Pt is following a Heart Healthy diet. Pt wants to continue to eat heart healthy. Reviewed tips and tricks pt could use with favorite recipes to continue to eat heart healthy but still have a taste of something familiar. Heart healthy eating tips reviewed (label reading, how to build a healthy plate, portion sizes, eating frequently across the [name]). Pt last A1C was elevated at 5.6, cusp of prediabetes. Discussed the differences between complex and refined carbs, recommended pt replace refined carbs with complex. Reviewed the benefits swapping in complex carbs and moderating portion sizes can have on managing blood glucose with patient. Per discussion, pt does not use canned/convenience foods often. Pt does not add salt to food. Pt does not eat out frequently. Pt expressed understanding of the information reviewed. Pt aware of nutrition education classes and has attended them regularly.  Lab Results  Component Value Date   HGBA1C 5.6 01/09/2018    Wt Readings from Last 3 Encounters:  06/19/18 141 lb 15.6 oz (64.4 kg)  06/14/18 141 lb (64 kg)  05/30/18 141 lb (64 kg)    Nutrition Diagnosis ? Food-and nutrition-related knowledge deficit related to lack of exposure to information as related to diagnosis of: ? CVD  Nutrition Intervention ? Pt's individual nutrition plan reviewed with pt.  Goal(s) ? Pt to identify and limit food sources of saturated fat, trans fat, refined carbohydrates and sodium ? Pt able to name foods that affect blood glucose.  Plan:   Pt to attend nutrition classes ? Nutrition I ? Nutrition II ? Portion Distortion   Will provide client-centered nutrition education as part of interdisciplinary care  Monitor and evaluate progress toward nutrition goal with team.    Ross MarcusAubrey Burklin, MS, RD, LDN 07/03/2018 12:22 PM

## 2018-07-05 ENCOUNTER — Encounter (HOSPITAL_COMMUNITY): Payer: BLUE CROSS/BLUE SHIELD

## 2018-07-07 ENCOUNTER — Encounter (HOSPITAL_COMMUNITY): Payer: BLUE CROSS/BLUE SHIELD

## 2018-07-07 DIAGNOSIS — G4730 Sleep apnea, unspecified: Secondary | ICD-10-CM | POA: Diagnosis not present

## 2018-07-07 DIAGNOSIS — R0683 Snoring: Secondary | ICD-10-CM | POA: Diagnosis not present

## 2018-07-07 DIAGNOSIS — I25758 Atherosclerosis of native coronary artery of transplanted heart with other forms of angina pectoris: Secondary | ICD-10-CM | POA: Diagnosis not present

## 2018-07-07 DIAGNOSIS — G4719 Other hypersomnia: Secondary | ICD-10-CM | POA: Diagnosis not present

## 2018-07-07 NOTE — Progress Notes (Signed)
Discharge Progress Report  Patient Details  Name: Diana [name] MRN: [id number] Date of Birth: 1956/03/02 Referring Provider:     [facility] from 04/06/2018 in [place]  Referring Provider  Tilley/Krasowski       Number of Visits: 24  Reason for Discharge:  Patient reached a stable level of exercise. Patient independent in their exercise. Patient has met program and personal goals.  Smoking History:  Social History   Tobacco Use  Smoking Status Never Smoker  Smokeless Tobacco Never Used    Diagnosis:  S/P CABG x 4  ADL UCSD:   Initial Exercise Prescription: Initial Exercise Prescription - 04/06/18 1500      Date of Initial Exercise RX and Referring Provider   Date  04/06/18    Referring Provider  Tilley/Krasowski    Expected Discharge Date  07/14/17      NuStep   Level  3    Minutes  10    METs  3.4      Arm Ergometer   Level  2    Watts  30    Minutes  10      Track   Laps  12    Minutes  10    METs  3      Prescription Details   Frequency (times per week)  3    Duration  Progress to 30 minutes of continuous aerobic without signs/symptoms of physical distress      Intensity   THRR 40-80% of Max Heartrate  64-128    Ratings of Perceived Exertion  11-13      Progression   Progression  Continue to progress workloads to maintain intensity without signs/symptoms of physical distress.      Resistance Training   Training Prescription  Yes    Weight  3lb    Reps  10-15       Discharge Exercise Prescription (Final Exercise Prescription Changes): Exercise Prescription Changes - 06/30/18 1024      Response to Exercise   Blood Pressure (Admit)  120/70    Blood Pressure (Exercise)  140/88    Blood Pressure (Exit)  106/70    Heart Rate (Admit)  73 bpm    Heart Rate (Exercise)  132 bpm    Heart Rate (Exit)  68 bpm    Rating of Perceived Exertion (Exercise)  13    Perceived Dyspnea (Exercise)   0    Symptoms  Muscular Chest Pain    Comments  Pt graduated from Cardiac Rehab     Duration  Progress to 30 minutes of  aerobic without signs/symptoms of physical distress    Intensity  THRR unchanged      Progression   Progression  Continue to progress workloads to maintain intensity without signs/symptoms of physical distress.    Average METs  2.93      Resistance Training   Training Prescription  Yes    Weight  3lbs    Reps  10-15    Time  10 Minutes      Interval Training   Interval Training  No      NuStep   Level  3    SPM  85    Minutes  20    METs  3.4      Arm Ergometer   Level  2    Minutes  10    METs  1.4      Track   Laps  17    Minutes  10    METs  3.99      Home Exercise Plan   Plans to continue exercise at  Home (comment)   Walking   Frequency  Add 2 additional days to program exercise sessions.    Initial Home Exercises Provided  04/24/18       Functional Capacity: 6 Minute Walk    Row Name 04/06/18 1604 06/19/18 0806 07/05/18 1036     6 Minute Walk   Phase  Initial  -  Discharge   Distance  1561 feet  1885 feet  -   Distance % Change  -  20.76 %  -   Distance Feet Change  -  324 ft  -   Walk Time  6 minutes  6 minutes  -   # of Rest Breaks  -  0  -   MPH  2.96  3.57  -   METS  3.61  4.52  -   RPE  11  12  -   Perceived Dyspnea   0  0  -   VO2 Peak  12.63  15.81  -   Symptoms  Yes (comment)  -  -   Comments  Right leg pain  Left Muscular Pain in Chest Area 4/10  -   Resting HR  73 bpm  54 bpm  -   Resting BP  112/68  138/78  -   Resting Oxygen Saturation   99 %  -  -   Exercise Oxygen Saturation  during 6 min walk  99 %  -  -   Max Ex. HR  76 bpm  102 bpm  -   Max Ex. BP  120/72  138/70  -   2 Minute Post BP  118/68  120/70  -      Psychological, QOL, Others - Outcomes: PHQ 2/9: Depression screen [facility] 2/9 07/07/2018 04/12/2018 01/02/2018 12/15/2017 11/14/2017  Decreased Interest 1 0 0 0 0  Down, Depressed, Hopeless 1 0 0 0 0  PHQ  - 2 Score 2 0 0 0 0    Quality of Life: Quality of Life - 06/29/18 1227      Quality of Life Scores   Health/Function Pre  24.57 %    Health/Function Post  16.03 %    Health/Function % Change  -34.76 %    Socioeconomic Pre  22.69 %    Socioeconomic Post  22.13 %    Socioeconomic % Change   -2.47 %    Psych/Spiritual Pre  25.07 %    Psych/Spiritual Post  22.29 %    Psych/Spiritual % Change  -11.09 %    Family Pre  27 %    Family Post  24.9 %    Family % Change  -7.78 %    GLOBAL Pre  24.59 %    GLOBAL Post  19.94 %    GLOBAL % Change  -18.91 %       Personal Goals: Goals established at orientation with interventions provided to work toward goal. Personal Goals and Risk Factors at Admission - 04/06/18 1608      Core Components/Risk Factors/Patient Goals on Admission    Weight Management  Weight Maintenance;Yes    Intervention  Weight Management: Develop a combined nutrition and exercise program designed to reach desired caloric intake, while maintaining appropriate intake of nutrient and fiber, sodium and fats, and appropriate energy expenditure required for the weight goal.;Weight Management: Provide  education and appropriate resources to help participant work on and attain dietary goals.    Admit Weight  137 lb 5.6 oz (62.3 kg)    Expected Outcomes  Weight Maintenance: Understanding of the daily nutrition guidelines, which includes 25-35% calories from fat, 7% or less cal from saturated fats, less than 250m cholesterol, less than 1.5gm of sodium, & 5 or more servings of fruits and vegetables daily;Short Term: Continue to assess and modify interventions until short term weight is achieved;Long Term: Adherence to nutrition and physical activity/exercise program aimed toward attainment of established weight goal;Understanding recommendations for meals to include 15-35% energy as protein, 25-35% energy from fat, 35-60% energy from carbohydrates, less than 2052mof dietary cholesterol,  20-35 gm of total fiber daily;Understanding of distribution of calorie intake throughout the day with the consumption of 4-5 meals/snacks    Hypertension  Yes    Intervention  Provide education on lifestyle modifcations including regular physical activity/exercise, weight management, moderate sodium restriction and increased consumption of fresh fruit, vegetables, and low fat dairy, alcohol moderation, and smoking cessation.;Monitor prescription use compliance.    Expected Outcomes  Short Term: Continued assessment and intervention until BP is < 140/9035mG in hypertensive participants. < 130/61m108m in hypertensive participants with diabetes, heart failure or chronic kidney disease.;Long Term: Maintenance of blood pressure at goal levels.    Lipids  Yes    Intervention  Provide education and support for participant on nutrition & aerobic/resistive exercise along with prescribed medications to achieve LDL <70mg20mL >40mg.33mExpected Outcomes  Short Term: Participant states understanding of desired cholesterol values and is compliant with medications prescribed. Participant is following exercise prescription and nutrition guidelines.;Long Term: Cholesterol controlled with medications as prescribed, with individualized exercise RX and with personalized nutrition plan. Value goals: LDL < 70mg, 37m> 40 mg.    Stress  Yes    Intervention  Offer individual and/or small group education and counseling on adjustment to heart disease, stress management and health-related lifestyle change. Teach and support self-help strategies.;Refer participants experiencing significant psychosocial distress to appropriate mental health specialists for further evaluation and treatment. When possible, include family members and significant others in education/counseling sessions.        Personal Goals Discharge: Goals and Risk Factor Review    Row Name 04/12/18 0833 [id number] 05/10/18 1702 06/07/18 1646 07/05/18 1030      Core Components/Risk Factors/Patient Goals Review   Personal Goals Review  Stress;Hypertension;Lipids;Weight Management/Obesity  Stress;Hypertension;Lipids;Weight Management/Obesity  Stress;Hypertension;Lipids;Weight Management/Obesity  Stress;Hypertension;Lipids;Weight Management/Obesity  Stress;Hypertension;Lipids;Weight Management/Obesity   Review  Pt willing to participate in CR exercise. Pt would like to increase her strength.   Pt with multiple CAD RFs.  She started exercise recently and is tolerating it well. VSS.  Pt with multiple CAD RFs.  She is tolerating exercise well.  Pam feels that she is increasing her strength.   Pt with multiple CAD RFs. Pam has returned from medical hold. Pt is still experiencing muscular pain. Pt is still able to participate in CR exercise with changes in workloads. However pt has new insurance and copay. Pam will f/u with CR staff on 06/09/2018 to determine if she will be able to manage copay or drop from the program.   Pam has graduated from cardiac rehab with 24 complete session. She graduated early d/t copay.  Her muscular pain limited her from vastly increasing her exercise workloads and MET levels.  Pam did enjoy participating in the program.    Expected  Outcomes  Pt will continue to participate in CR exercise, nutrition, and lifestyle modification.   Pt will continue to participate in CR exercise, nutrition, and lifestyle modification.   Pt will continue to participate in CR exercise, nutrition, and lifestyle modification.   Pt will continue to participate in CR exercise, nutrition, and lifestyle modification.   Pt will continue to participate in exercise, nutrition, and lifestyle modification.  Pam plans to utilize a local gym for her exercise.       Exercise Goals and Review: Exercise Goals    Row Name 04/06/18 1607             Exercise Goals   Increase Physical Activity  Yes       Intervention  Develop an individualized exercise prescription for  aerobic and resistive training based on initial evaluation findings, risk stratification, comorbidities and participant's personal goals.;Provide advice, education, support and counseling about physical activity/exercise needs.       Expected Outcomes  Short Term: Attend rehab on a regular basis to increase amount of physical activity.       Intervention  Provide advice, education, support and counseling about physical activity/exercise needs.;Develop an individualized exercise prescription for aerobic and resistive training based on initial evaluation findings, risk stratification, comorbidities and participant's personal goals.       Expected Outcomes  Short Term: Increase workloads from initial exercise prescription for resistance, speed, and METs.       Able to understand and use rate of perceived exertion (RPE) scale  Yes       Intervention  Provide education and explanation on how to use RPE scale       Expected Outcomes  Short Term: Able to use RPE daily in rehab to express subjective intensity level;Long Term:  Able to use RPE to guide intensity level when exercising independently       Knowledge and understanding of Target Heart Rate Range (THRR)  Yes       Intervention  Provide education and explanation of THRR including how the numbers were predicted and where they are located for reference       Expected Outcomes  Short Term: Able to state/look up THRR       Able to check pulse independently  Yes       Intervention  Provide education and demonstration on how to check pulse in carotid and radial arteries.;Review the importance of being able to check your own pulse for safety during independent exercise       Expected Outcomes  Short Term: Able to explain why pulse checking is important during independent exercise;Long Term: Able to check pulse independently and accurately       Understanding of Exercise Prescription  Yes       Intervention  Provide education, explanation, and written  materials on patient's individual exercise prescription       Expected Outcomes  Short Term: Able to explain program exercise prescription;Long Term: Able to explain home exercise prescription to exercise independently          Exercise Goals Re-Evaluation: Exercise Goals Re-Evaluation    Row Name 05/10/18 1642 07/05/18 1040 07/05/18 1044         Exercise Goal Re-Evaluation   Exercise Goals Review  Increase Physical Activity;Understanding of Exercise Prescription  Increase Physical Activity;Understanding of Exercise Prescription  Increase Physical Activity;Understanding of Exercise Prescription     Comments  Pt is continuing to respond well to prescription and workloads. Pt has been trouble with  transportation, but is still making an effort to return to exercise.   s   Pt completed 24 sessions of Cardiac Rehab. Depsite pt being limited with musculoskeletal pain, pt increased fucntional capacity by 20.76%. Pt increased post 6MWT distance by 350f. Pt increased post single leg balance test from 3.03 secs to 30 secs. Pt achieved goal to increse strength, but was limited greatly by muscle pain and was not able to return back work yet, which was another goal she set forth.      Expected Outcomes  Pt will continue to increase cardiovascular fitness and return to regular activities. Will follow up with pt regarding home exercise plan.   Pt plans to continue to walk for exercise 3-4 days a week for 30-45 minutes.   Pt plans to continue to walk for exercise 3-4 days a week for 30-45 minutes.         Nutrition & Weight - Outcomes: Pre Biometrics - 04/06/18 1602      Pre Biometrics   Height  '5\' 2"'  (1.575 m)    Weight  63.2 kg    Waist Circumference  36 inches    Hip Circumference  39 inches    Waist to Hip Ratio  0.92 %    BMI (Calculated)  25.48    Triceps Skinfold  30 mm    % Body Fat  38.3 %    Grip Strength  25 kg    Flexibility  13 in    Single Leg Stand  3.03 seconds      Post  Biometrics - 06/19/18 0813       Post  Biometrics   Height  '5\' 2"'  (1.575 m)    Weight  64.4 kg    Waist Circumference  35.5 inches    Hip Circumference  38.5 inches    Waist to Hip Ratio  0.92 %    BMI (Calculated)  25.96    Triceps Skinfold  30 mm    % Body Fat  38.4 %    Grip Strength  28 kg    Flexibility  11 in    Single Leg Stand  30 seconds       Nutrition: Nutrition Therapy & Goals - 04/07/18 1046      Nutrition Therapy   Diet  heart healthy      Personal Nutrition Goals   Nutrition Goal  Pt to identify and limit food sources of saturated fat, trans fat, refined carbohydrates and sodium    Personal Goal #2  Pt able to name foods that affect blood glucose      Intervention Plan   Intervention  Prescribe, educate and counsel regarding individualized specific dietary modifications aiming towards targeted core components such as weight, hypertension, lipid management, diabetes, heart failure and other comorbidities.    Expected Outcomes  Short Term Goal: Understand basic principles of dietary content, such as calories, fat, sodium, cholesterol and nutrients.;Long Term Goal: Adherence to prescribed nutrition plan.       Nutrition Discharge: Nutrition Assessments - 04/07/18 1055      MEDFICTS Scores   Pre Score  24       Education Questionnaire Score: Knowledge Questionnaire Score - 06/28/18 1351      Knowledge Questionnaire Score   Pre Score  17/24    Post Score  21/24       Goals reviewed with patient; copy given to patient.

## 2018-07-10 ENCOUNTER — Encounter (HOSPITAL_COMMUNITY): Payer: BLUE CROSS/BLUE SHIELD

## 2018-07-12 ENCOUNTER — Encounter (HOSPITAL_COMMUNITY): Payer: BLUE CROSS/BLUE SHIELD

## 2018-07-14 ENCOUNTER — Encounter (HOSPITAL_COMMUNITY): Payer: BLUE CROSS/BLUE SHIELD

## 2018-07-17 ENCOUNTER — Encounter (HOSPITAL_COMMUNITY): Payer: BLUE CROSS/BLUE SHIELD

## 2018-07-18 DIAGNOSIS — I10 Essential (primary) hypertension: Secondary | ICD-10-CM | POA: Diagnosis not present

## 2018-07-18 DIAGNOSIS — G4700 Insomnia, unspecified: Secondary | ICD-10-CM | POA: Diagnosis not present

## 2018-07-18 DIAGNOSIS — R0789 Other chest pain: Secondary | ICD-10-CM | POA: Diagnosis not present

## 2018-07-18 DIAGNOSIS — D509 Iron deficiency anemia, unspecified: Secondary | ICD-10-CM | POA: Diagnosis not present

## 2018-07-18 DIAGNOSIS — E785 Hyperlipidemia, unspecified: Secondary | ICD-10-CM | POA: Diagnosis not present

## 2018-07-18 DIAGNOSIS — M542 Cervicalgia: Secondary | ICD-10-CM | POA: Diagnosis not present

## 2018-09-05 DIAGNOSIS — F4323 Adjustment disorder with mixed anxiety and depressed mood: Secondary | ICD-10-CM | POA: Insufficient documentation

## 2018-09-18 ENCOUNTER — Ambulatory Visit: Payer: Self-pay | Admitting: Cardiology

## 2018-09-21 DIAGNOSIS — H6121 Impacted cerumen, right ear: Secondary | ICD-10-CM | POA: Diagnosis not present

## 2018-09-21 DIAGNOSIS — J0110 Acute frontal sinusitis, unspecified: Secondary | ICD-10-CM | POA: Diagnosis not present

## 2018-09-21 DIAGNOSIS — J301 Allergic rhinitis due to pollen: Secondary | ICD-10-CM | POA: Diagnosis not present

## 2018-10-06 DIAGNOSIS — J0110 Acute frontal sinusitis, unspecified: Secondary | ICD-10-CM | POA: Diagnosis not present

## 2018-10-06 DIAGNOSIS — M542 Cervicalgia: Secondary | ICD-10-CM | POA: Diagnosis not present

## 2018-10-06 DIAGNOSIS — F4323 Adjustment disorder with mixed anxiety and depressed mood: Secondary | ICD-10-CM | POA: Diagnosis not present

## 2018-10-06 DIAGNOSIS — J301 Allergic rhinitis due to pollen: Secondary | ICD-10-CM | POA: Diagnosis not present

## 2018-10-16 ENCOUNTER — Telehealth: Payer: Self-pay | Admitting: Cardiology

## 2018-10-16 NOTE — Telephone Encounter (Signed)
Virtual Visit Pre-Appointment Phone Call  "(Name), I am calling you today to discuss your upcoming appointment. We are currently trying to limit exposure to the virus that causes COVID-19 by seeing patients at home rather than in the office."  1. "What is the BEST phone number to call the day of the visit?" - include this in appointment notes  2. Do you have or have access to (through a family member/friend) a smartphone with video capability that we can use for your visit?" a. If yes - list this number in appt notes as cell (if different from BEST phone #) and list the appointment type as a VIDEO visit in appointment notes b. If no - list the appointment type as a PHONE visit in appointment notes  3. Confirm consent - "In the setting of the current Covid19 crisis, you are scheduled for a (phone or video) visit with your provider on (date) at (time).  Just as we do with many in-office visits, in order for you to participate in this visit, we must obtain consent.  If you'd like, I can send this to your mychart (if signed up) or email for you to review.  Otherwise, I can obtain your verbal consent now.  All virtual visits are billed to your insurance company just like a normal visit would be.  By agreeing to a virtual visit, we'd like you to understand that the technology does not allow for your provider to perform an examination, and thus may limit your provider's ability to fully assess your condition. If your provider identifies any concerns that need to be evaluated in person, we will make arrangements to do so.  Finally, though the technology is pretty good, we cannot assure that it will always work on either your or our end, and in the setting of a video visit, we may have to convert it to a phone-only visit.  In either situation, we cannot ensure that we have a secure connection.  Are you willing to proceed?" STAFF: Did the patient verbally acknowledge consent to telehealth visit? Document  YES/NO here: YES  4. Advise patient to be prepared - "Two hours prior to your appointment, go ahead and check your blood pressure, pulse, oxygen saturation, and your weight (if you have the equipment to check those) and write them all down. When your visit starts, your provider will ask you for this information. If you have an Apple Watch or Kardia device, please plan to have heart rate information ready on the day of your appointment. Please have a pen and paper handy nearby the day of the visit as well."  5. Give patient instructions for MyChart download to smartphone OR [email or web address] as below if video visit (depending on what platform provider is using)  6. Inform patient they will receive a phone call 15 minutes prior to their appointment time (may be from unknown caller ID) so they should be prepared to answer    TELEPHONE CALL NOTE  Diana [name] has been deemed a candidate for a follow-up tele-health visit to limit community exposure during the Covid-19 pandemic. I spoke with the patient via phone to ensure availability of phone/video source, confirm preferred email & phone number, and discuss instructions and expectations.  I reminded Diana [name] to be prepared with any vital sign and/or heart rhythm information that could potentially be obtained via home monitoring, at the time of her visit. I reminded Diana [name] to expect a phone call prior to  her visit.  [name] 10/16/2018 10:35 AM   INSTRUCTIONS FOR DOWNLOADING THE MYCHART APP TO SMARTPHONE  - The patient must first make sure to have activated MyChart and know their login information - If Apple, go to [employer] Store and type in MyChart in the search bar and download the app. If Android, ask patient to go to [employer] Play Store and type in [place] in the search bar and download the app. The app is free but as with any other app downloads, their phone may require them to verify saved payment information or Apple/Android  password.  - The patient will need to then log into the app with their MyChart username and password, and select [facility] as their healthcare provider to link the account. When it is time for your visit, go to the MyChart app, find appointments, and click Begin Video Visit. Be sure to Select Allow for your device to access the Microphone and Camera for your visit. You will then be connected, and your provider will be with you shortly.  **If they have any issues connecting, or need assistance please contact MyChart service desk [phone])**  **If using a computer, in order to ensure the best quality for their visit they will need to use either of the following Internet Browsers: [employer] Edge, or Google Chrome**  IF USING DOXIMITY or DOXY.ME - The patient will receive a link just prior to their visit by text.     FULL LENGTH CONSENT FOR TELE-HEALTH VISIT   I hereby voluntarily request, consent and authorize [facility] and its employed or contracted physicians, physician assistants, nurse practitioners or other licensed health care professionals (the Practitioner), to provide me with telemedicine health care services (the Services") as deemed necessary by the treating Practitioner. I acknowledge and consent to receive the Services by the Practitioner via telemedicine. I understand that the telemedicine visit will involve communicating with the Practitioner through live audiovisual communication technology and the disclosure of certain medical information by electronic transmission. I acknowledge that I have been given the opportunity to request an in-person assessment or other available alternative prior to the telemedicine visit and am voluntarily participating in the telemedicine visit.  I understand that I have the right to withhold or withdraw my consent to the use of telemedicine in the course of my care at any time, without affecting my right to future care or treatment,  and that the Practitioner or I may terminate the telemedicine visit at any time. I understand that I have the right to inspect all information obtained and/or recorded in the course of the telemedicine visit and may receive copies of available information for a reasonable fee.  I understand that some of the potential risks of receiving the Services via telemedicine include:   Delay or interruption in medical evaluation due to technological equipment failure or disruption;  Information transmitted may not be sufficient (e.g. poor resolution of images) to allow for appropriate medical decision making by the Practitioner; and/or   In rare instances, security protocols could fail, causing a breach of personal health information.  Furthermore, I acknowledge that it is my responsibility to provide information about my medical history, conditions and care that is complete and accurate to the best of my ability. I acknowledge that Practitioner's advice, recommendations, and/or decision may be based on factors not within their control, such as incomplete or inaccurate data provided by me or distortions of diagnostic images or specimens that may result from electronic transmissions. I  understand that the practice of medicine is not an exact science and that Practitioner makes no warranties or guarantees regarding treatment outcomes. I acknowledge that I will receive a copy of this consent concurrently upon execution via email to the email address I last provided but may also request a printed copy by calling the office of [place].    I understand that my insurance will be billed for this visit.   I have read or had this consent read to me.  I understand the contents of this consent, which adequately explains the benefits and risks of the Services being provided via telemedicine.   I have been provided ample opportunity to ask questions regarding this consent and the Services and have had my questions  answered to my satisfaction.  I give my informed consent for the services to be provided through the use of telemedicine in my medical care  By participating in this telemedicine visit I agree to the above.

## 2018-10-24 ENCOUNTER — Telehealth (INDEPENDENT_AMBULATORY_CARE_PROVIDER_SITE_OTHER): Payer: BLUE CROSS/BLUE SHIELD | Admitting: Cardiology

## 2018-10-24 ENCOUNTER — Other Ambulatory Visit: Payer: Self-pay

## 2018-10-24 ENCOUNTER — Encounter: Payer: Self-pay | Admitting: Cardiology

## 2018-10-24 VITALS — BP 124/77 | HR 67

## 2018-10-24 DIAGNOSIS — R0789 Other chest pain: Secondary | ICD-10-CM

## 2018-10-24 DIAGNOSIS — Z951 Presence of aortocoronary bypass graft: Secondary | ICD-10-CM

## 2018-10-24 DIAGNOSIS — I10 Essential (primary) hypertension: Secondary | ICD-10-CM | POA: Diagnosis not present

## 2018-10-24 NOTE — Patient Instructions (Signed)
Medication Instructions:  Your physician recommends that you continue on your current medications as directed. Please refer to the Current Medication list given to you today.  If you need a refill on your cardiac medications before your next appointment, please call your pharmacy.   Lab work: None.  If you have labs (blood work) drawn today and your tests are completely normal, you will receive your results only by: . MyChart Message (if you have MyChart) OR . A paper copy in the mail If you have any lab test that is abnormal or we need to change your treatment, we will call you to review the results.  Testing/Procedures: None.  Follow-Up: At [facility], you and your health needs are our priority.  As part of our continuing mission to provide you with exceptional heart care, we have created designated Provider Care Teams.  These Care Teams include your primary Cardiologist (physician) and Advanced Practice Providers (APPs -  Physician Assistants and Nurse Practitioners) who all work together to provide you with the care you need, when you need it. You will need a follow up appointment in 1 weeks.  Please call our office 2 months in advance to schedule this appointment.  You may see No primary care provider on file. or another member of our [facility] Provider Team in [place]: Brian Munley, MD . Rajan Revankar, MD  Any Other Special Instructions Will Be Listed Below (If Applicable).     

## 2018-10-24 NOTE — Progress Notes (Signed)
Virtual Visit via Video Note   This visit type was conducted due to national recommendations for restrictions regarding the COVID-19 Pandemic (e.g. social distancing) in an effort to limit this patient's exposure and mitigate transmission in our community.  Due to her co-morbid illnesses, this patient is at least at moderate risk for complications without adequate follow up.  This format is felt to be most appropriate for this patient at this time.  All issues noted in this document were discussed and addressed.  A limited physical exam was performed with this format.  Please refer to the patient's chart for her consent to telehealth for [facility].  Evaluation Performed:  Follow-up visit  This visit type was conducted due to national recommendations for restrictions regarding the COVID-19 Pandemic (e.g. social distancing).  This format is felt to be most appropriate for this patient at this time.  All issues noted in this document were discussed and addressed.  No physical exam was performed (except for noted visual exam findings with Video Visits).  Please refer to the patient's chart (MyChart message for video visits and phone note for telephone visits) for the patient's consent to telehealth [facility].  Date:  10/24/2018  ID: Diana [name] Short, DOB 07/15/1955, MRN [id number]   Patient Location: [address] [place] [id number]   [facility]   [facility] [place] [facility]  PCP:  Porfirio OarJeffery, [place], PA  Cardiologist:  Gypsy Balsamobert Coraleigh Sheeran, MD     Chief Complaint: I am still having chest pain  History of Present Illness:    Diana [name] Wiley is a 63 y.o. female  who presents via audio/video conferencing for a telehealth visit today.  With coronary artery disease, hypertension, dyslipidemia did have a bypass surgery few months ago since that time she has been complaining of having chest pain.  Stress test was done to rule out ischemia as potential source of her  symptoms.  PET scan negative now she complained of having chest pain that is reproducible by pressing chest wall.  Also when she take a deep breath it hurts.  Patch in the chest hurts as well.  She did not try much so far she takes only Tylenol with no relief.   The patient does not have symptoms concerning for COVID-19 infection (fever, chills, cough, or new SHORTNESS OF BREATH).    Prior CV studies:   The following studies were reviewed today:  .     Past Medical History:  Diagnosis Date  . Allergy   . Arthritis    feet  . Bronchitis 10/30/2016  . Carpal tunnel syndrome   . Coronary artery disease   . GERD (gastroesophageal reflux disease)   . Hyperlipidemia   . Hypertension   . Non-ST elevation (NSTEMI) myocardial infarction (HCC) 10/30/2016  . NSTEMI (non-ST elevated myocardial infarction) (HCC) 10/30/2016    Past Surgical History:  Procedure Laterality Date  . CARPAL TUNNEL RELEASE Left 02/12/2016   Procedure: LEFT CARPAL TUNNEL RELEASE;  Surgeon: Cindee SaltGary Kuzma, MD;  Location: [facility];  Service: Orthopedics;  Laterality: Left;  FAB  . CORONARY ARTERY BYPASS GRAFT N/A 01/10/2018   Procedure: CORONARY ARTERY BYPASS GRAFTING (CABG) TIMES 4, USING LEFT INTERNAL MAMMARY ARTERY TO OM1  AND ENDOSCOPICALLY HARVESTED RIGHT SAPHENOUS VEIN TO DIAGONAL, AND SEQUENTIALLY TO OM2 AND DISTAL CIRCUMFLEX;  Surgeon: Delight OvensGerhardt, Edward B, MD;  Location: [facility] OR;  Service: Open Heart Surgery;  Laterality: N/A;  . LEFT HEART CATH AND CORONARY ANGIOGRAPHY  N/A 01/05/2018   Procedure: LEFT HEART CATH AND CORONARY ANGIOGRAPHY;  Surgeon: Lennette Bihari, MD;  [facility] INV[facility];  Service: Cardiovascular;  Laterality: N/A;  . TEE WITHOUT CARDIOVERSION N/A 01/10/2018   Procedure: TRANSESOPHAGEAL ECHOCARDIOGRAM (TEE);  Surgeon: Delight Ovens, MD;  Location[facility] OR;  Service: Open Heart Surgery;  Laterality: N/A;  . TONSILLECTOMY    . TUBAL LIGATION    . WRIST SURGERY Right       Current Meds  Medication Sig  . acetaminophen (TYLENOL) 500 MG tablet Take 500 mg by mouth every 6 (six) hours as needed.  . ALPRAZolam (XANAX) 0.5 MG tablet Take 1 tablet (0.5 mg total) by mouth 2 (two) times daily as needed for anxiety.  Marland Kitchen aspirin EC 81 MG tablet Take 81 mg by mouth daily.  Marland Kitchen atorvastatin (LIPITOR) 80 MG tablet Take 0.5 tablets (40 mg total) by mouth daily at 6 PM.  . cetirizine (ZYRTEC) 10 MG tablet Take 10 mg by mouth daily.  . DULoxetine (CYMBALTA) 30 MG capsule Take 30 mg by mouth daily.  . Metoprolol Tartrate 75 MG TABS Take 75 mg by mouth every 12 (twelve) hours.  . montelukast (SINGULAIR) 10 MG tablet Take 10 mg by mouth at bedtime.  . nitroGLYCERIN (NITROSTAT) 0.4 MG SL tablet Place 1 tablet (0.4 mg total) under the tongue every 5 (five) minutes as needed for chest pain.  Marland Kitchen omeprazole (PRILOSEC) 20 MG capsule Take 1 capsule (20 mg total) by mouth daily.  . pregabalin (LYRICA) 200 MG capsule Take 200 mg by mouth 3 (three) times daily.   . promethazine (PHENERGAN) 25 MG tablet Take 25 mg by mouth every 6 (six) hours as needed for nausea or vomiting.  . traZODone (DESYREL) 50 MG tablet Take 25-50 mg by mouth at bedtime as needed for sleep.      Family History: The patient's family history includes Brain cancer in her sister; HIV in her sister; Healthy in her brother; Hypertension in her mother; Liver disease in her brother; Thyroid disease in her mother.   ROS:   Please see the history of present illness.     All other systems reviewed and are negative.   Labs/Other Tests and Data Reviewed:     Recent Labs: 01/11/2018: Magnesium 1.9 01/16/2018: BUN 10; Creatinine, Ser 0.67; Hemoglobin 9.2; Platelets 241; Potassium 3.9; Sodium 135 03/10/2018: ALT 49  Recent Lipid Panel    Component Value Date/Time   CHOL 128 03/10/2018 1615   TRIG 168 (H) 03/10/2018 1615   HDL 44 03/10/2018 1615   CHOLHDL 2.9 03/10/2018 1615   CHOLHDL 3.0 10/31/2016 0129   VLDL 10  10/31/2016 0129   LDLCALC 50 03/10/2018 1615      Exam:    Vital Signs:  BP 124/77   Pulse 67   SpO2 99%     Wt Readings from Last 3 Encounters:  06/19/18 141 lb 15.6 oz (64.4 kg)  06/14/18 141 lb (64 kg)  05/30/18 141 lb (64 kg)     Well nourished, well developed in no acute distress. Alert awake oriented x3 talking to me via video link but today no distress still try to exercise on a regular basis but because of chest pain that she had when she moved she cannot do as much as she would like to.  Diagnosis for this visit:   1. Benign essential HTN   2. Atypical chest pain   3. S/P CABG x 4  ASSESSMENT & PLAN:    1.  Atypical chest pain.  Does not look like a cardiac pain.  I advised him to get lidocaine patch and see if it helps also I want her to try just 1 day 3 Motrin's and see if that helps.  I told her to take Motrin is on full stomach.  I would like to talk to her next week to see how situation resolved involved. 2.  Benign essential hypertension blood pressure well controlled. 3.  Status post coronary artery bypass graft.  Stable.  COVID-19 Education: The signs and symptoms of COVID-19 were discussed with the patient and how to seek care for testing (follow up with PCP or arrange E-visit).  The importance of social distancing was discussed today.  Patient Risk:   After full review of this patients clinical status, I feel that they are at least moderate risk at this time.  Time:   Today, I have spent 17 minutes with the patient with telehealth technology discussing pt health issues.  I spent 5 minutes reviewing her chart before the visit.  Visit was finished at 3-hour 15 p.[name].    Medication Adjustments/Labs and Tests Ordered: Current medicines are reviewed at length with the patient today.  Concerns regarding medicines are outlined above.  No orders of the defined types were placed in this encounter.  Medication changes: No orders of the defined types were  placed in this encounter.    Disposition: Follow-up 1 week  Signed, Georgeanna Lea, MD, [facility] 10/24/2018 4:36 PM    [facility]

## 2018-10-31 ENCOUNTER — Other Ambulatory Visit: Payer: Self-pay

## 2018-10-31 ENCOUNTER — Encounter: Payer: Self-pay | Admitting: Emergency Medicine

## 2018-10-31 ENCOUNTER — Encounter: Payer: Self-pay | Admitting: Cardiology

## 2018-10-31 ENCOUNTER — Telehealth: Payer: Self-pay | Admitting: Emergency Medicine

## 2018-10-31 ENCOUNTER — Telehealth (INDEPENDENT_AMBULATORY_CARE_PROVIDER_SITE_OTHER): Payer: BLUE CROSS/BLUE SHIELD | Admitting: Cardiology

## 2018-10-31 VITALS — BP 106/79 | HR 65 | Wt 149.0 lb

## 2018-10-31 DIAGNOSIS — I25759 Atherosclerosis of native coronary artery of transplanted heart with unspecified angina pectoris: Secondary | ICD-10-CM

## 2018-10-31 DIAGNOSIS — I10 Essential (primary) hypertension: Secondary | ICD-10-CM

## 2018-10-31 DIAGNOSIS — Z951 Presence of aortocoronary bypass graft: Secondary | ICD-10-CM

## 2018-10-31 DIAGNOSIS — R0789 Other chest pain: Secondary | ICD-10-CM

## 2018-10-31 DIAGNOSIS — E785 Hyperlipidemia, unspecified: Secondary | ICD-10-CM | POA: Diagnosis not present

## 2018-10-31 NOTE — Patient Instructions (Signed)
Medication Instructions:  Your physician recommends that you continue on your current medications as directed. Please refer to the Current Medication list given to you today.  If you need a refill on your cardiac medications before your next appointment, please call your pharmacy.   Lab work: None.  If you have labs (blood work) drawn today and your tests are completely normal, you will receive your results only by: . MyChart Message (if you have MyChart) OR . A paper copy in the mail If you have any lab test that is abnormal or we need to change your treatment, we will call you to review the results.  Testing/Procedures: None.   Follow-Up: At [facility], you and your health needs are our priority.  As part of our continuing mission to provide you with exceptional heart care, we have created designated Provider Care Teams.  These Care Teams include your primary Cardiologist (physician) and Advanced Practice Providers (APPs -  Physician Assistants and Nurse Practitioners) who all work together to provide you with the care you need, when you need it. You will need a follow up appointment in 2 weeks.  Please call our office 2 months in advance to schedule this appointment.  You may see No primary care provider on file. or another member of our [facility] Provider Team in : Brian Munley, MD . Rajan Revankar, MD  Any Other Special Instructions Will Be Listed Below (If Applicable).     

## 2018-10-31 NOTE — Telephone Encounter (Signed)
Left message for patient to return call to be scheduled for 2 week virtual visit.

## 2018-11-01 DIAGNOSIS — M9981 Other biomechanical lesions of cervical region: Secondary | ICD-10-CM | POA: Diagnosis not present

## 2018-11-01 DIAGNOSIS — G894 Chronic pain syndrome: Secondary | ICD-10-CM | POA: Diagnosis not present

## 2018-11-01 DIAGNOSIS — M4802 Spinal stenosis, cervical region: Secondary | ICD-10-CM | POA: Insufficient documentation

## 2018-11-01 DIAGNOSIS — R0789 Other chest pain: Secondary | ICD-10-CM | POA: Diagnosis not present

## 2018-11-01 DIAGNOSIS — M5030 Other cervical disc degeneration, unspecified cervical region: Secondary | ICD-10-CM | POA: Diagnosis not present

## 2018-11-01 DIAGNOSIS — M47812 Spondylosis without myelopathy or radiculopathy, cervical region: Secondary | ICD-10-CM | POA: Insufficient documentation

## 2018-11-01 DIAGNOSIS — G8918 Other acute postprocedural pain: Secondary | ICD-10-CM | POA: Diagnosis not present

## 2018-11-03 ENCOUNTER — Telehealth: Payer: Self-pay | Admitting: *Deleted

## 2018-11-03 DIAGNOSIS — J301 Allergic rhinitis due to pollen: Secondary | ICD-10-CM | POA: Insufficient documentation

## 2018-11-03 NOTE — Telephone Encounter (Signed)
Pt calling to make sure note for work has been mailed. I told her it was in the chart and that someone had taken care of it. Just wanted to double check with you to make sure.

## 2018-11-03 NOTE — Telephone Encounter (Signed)
Pt saw Porfirio Oar and the notes from her office visit were not sent. Could you please send the office notes to her [phone]

## 2018-11-06 NOTE — Telephone Encounter (Signed)
Have you finished with her televisit note? It is till showing as opened.

## 2018-11-14 ENCOUNTER — Telehealth: Payer: Medicaid Other | Admitting: Cardiology

## 2018-11-17 ENCOUNTER — Other Ambulatory Visit: Payer: Self-pay

## 2018-11-17 ENCOUNTER — Telehealth (INDEPENDENT_AMBULATORY_CARE_PROVIDER_SITE_OTHER): Payer: BLUE CROSS/BLUE SHIELD | Admitting: Cardiology

## 2018-11-17 ENCOUNTER — Encounter: Payer: Self-pay | Admitting: Cardiology

## 2018-11-17 DIAGNOSIS — Z951 Presence of aortocoronary bypass graft: Secondary | ICD-10-CM | POA: Diagnosis not present

## 2018-11-17 DIAGNOSIS — M542 Cervicalgia: Secondary | ICD-10-CM

## 2018-11-17 DIAGNOSIS — I25759 Atherosclerosis of native coronary artery of transplanted heart with unspecified angina pectoris: Secondary | ICD-10-CM

## 2018-11-17 DIAGNOSIS — I10 Essential (primary) hypertension: Secondary | ICD-10-CM

## 2018-11-17 DIAGNOSIS — R0789 Other chest pain: Secondary | ICD-10-CM

## 2018-11-17 NOTE — Patient Instructions (Signed)
Medication Instructions:  Your physician recommends that you continue on your current medications as directed. Please refer to the Current Medication list given to you today.  If you need a refill on your cardiac medications before your next appointment, please call your pharmacy.   Lab work: None.  If you have labs (blood work) drawn today and your tests are completely normal, you will receive your results only by: . MyChart Message (if you have MyChart) OR . A paper copy in the mail If you have any lab test that is abnormal or we need to change your treatment, we will call you to review the results.  Testing/Procedures: None.   Follow-Up: At [facility], you and your health needs are our priority.  As part of our continuing mission to provide you with exceptional heart care, we have created designated Provider Care Teams.  These Care Teams include your primary Cardiologist (physician) and Advanced Practice Providers (APPs -  Physician Assistants and Nurse Practitioners) who all work together to provide you with the care you need, when you need it. You will need a follow up appointment in 3 months.  Please call our office 2 months in advance to schedule this appointment.  You may see No primary care provider on file. or another member of our [facility] Provider Team in [place]: Brian Munley, MD . Rajan Revankar, MD  Any Other Special Instructions Will Be Listed Below (If Applicable).     

## 2018-11-17 NOTE — Progress Notes (Signed)
Virtual Visit via Telephone Note   This visit type was conducted due to national recommendations for restrictions regarding the COVID-19 Pandemic (e.g. social distancing) in an effort to limit this patient's exposure and mitigate transmission in our community.  Due to her co-morbid illnesses, this patient is at least at moderate risk for complications without adequate follow up.  This format is felt to be most appropriate for this patient at this time.  The patient did not have access to video technology/had technical difficulties with video requiring transitioning to audio format only (telephone).  All issues noted in this document were discussed and addressed.  No physical exam could be performed with this format.  Please refer to the patient's chart for her  consent to telehealth [facility].  Evaluation Performed:  Follow-up visit  This visit type was conducted due to national recommendations for restrictions regarding the COVID-19 Pandemic (e.g. social distancing).  This format is felt to be most appropriate for this patient at this time.  All issues noted in this document were discussed and addressed.  No physical exam was performed (except for noted visual exam findings with Video Visits).  Please refer to the patient's chart (MyChart message for video visits and phone note for telephone visits) for the patient's consent to telehealth [facility].  Date:  11/17/2018  ID: Diana MuscaPamela M [name], DOB 01/29/1956, MRN [id number]   Patient Location: [address] [place] [id number]   [facility]   [facility] [place] [facility]  PCP:  Porfirio OarJeffery, [place], PA  Cardiologist:  Gypsy Balsamobert Yamel Bale, MD     Chief Complaint: Still having chest pain  History of Present Illness:    Diana Muscaamela M Craw is a 63 y.o. female  who presents via audio/video conferencing for a telehealth visit today.  With complex past medical history which include coronary artery disease, dyslipidemia,  essential hypertension.  Status post coronary artery bypass graft.  She has been coming to me because of atypical chest pain pain is reproducible by pressing chest wall so far all testing for ischemia has been negative.  I try to use lidocaine patch with not much effect we try to some nonsteroidal anti-inflammatory medication which not marked improvement she is following now with neurology and she is having some physical therapy so far only 2 sessions therefore she cannot tell me if it helps or not.  Otherwise seems to be doing well.   The patient does not have symptoms concerning for COVID-19 infection (fever, chills, cough, or new SHORTNESS OF BREATH).    Prior CV studies:   The following studies were reviewed today:  Stress test done in December showed no evidence of ischemia     Past Medical History:  Diagnosis Date  . Allergy   . Arthritis    feet  . Bronchitis 10/30/2016  . Carpal tunnel syndrome   . Coronary artery disease   . GERD (gastroesophageal reflux disease)   . Hyperlipidemia   . Hypertension   . Non-ST elevation (NSTEMI) myocardial infarction (HCC) 10/30/2016  . NSTEMI (non-ST elevated myocardial infarction) (HCC) 10/30/2016    Past Surgical History:  Procedure Laterality Date  . CARPAL TUNNEL RELEASE Left 02/12/2016   Procedure: LEFT CARPAL TUNNEL RELEASE;  Surgeon: Cindee SaltGary Kuzma, MD;  Location: [facility];  Service: Orthopedics;  Laterality: Left;  FAB  . CORONARY ARTERY BYPASS GRAFT N/A 01/10/2018   Procedure: CORONARY ARTERY BYPASS GRAFTING (CABG) TIMES 4, USING LEFT INTERNAL MAMMARY ARTERY TO  OM1  AND ENDOSCOPICALLY HARVESTED RIGHT SAPHENOUS VEIN TO DIAGONAL, AND SEQUENTIALLY TO OM2 AND DISTAL CIRCUMFLEX;  Surgeon: Delight OvensGerhardt, Edward B, MD;  Location: [facility] OR;  Service: Open Heart Surgery;  Laterality: N/A;  . LEFT HEART CATH AND CORONARY ANGIOGRAPHY N/A 01/05/2018   Procedure: LEFT HEART CATH AND CORONARY ANGIOGRAPHY;  Surgeon: Lennette BihariKelly, Thomas A, MD;  [facility] INVASIVE CV LAB;  Service: Cardiovascular;  Laterality: N/A;  . TEE WITHOUT CARDIOVERSION N/A 01/10/2018   Procedure: TRANSESOPHAGEAL ECHOCARDIOGRAM (TEE);  Surgeon: Delight OvensGerhardt, Edward B, MD;  [facility];  Service: Open Heart Surgery;  Laterality: N/A;  . TONSILLECTOMY    . TUBAL LIGATION    . WRIST SURGERY Right      Current Meds  Medication Sig  . acetaminophen (TYLENOL) 500 MG tablet Take 500 mg by mouth every 6 (six) hours as needed.  . ALPRAZolam (XANAX) 0.5 MG tablet Take 1 tablet (0.5 mg total) by mouth 2 (two) times daily as needed for anxiety.  Marland Kitchen. aspirin EC 81 MG tablet Take 81 mg by mouth daily.  Marland Kitchen. atorvastatin (LIPITOR) 80 MG tablet Take 0.5 tablets (40 mg total) by mouth daily at 6 PM.  . cetirizine (ZYRTEC) 10 MG tablet Take 10 mg by mouth daily.  . DULoxetine (CYMBALTA) 30 MG capsule Take 30 mg by mouth daily.  . methocarbamol (ROBAXIN) 750 MG tablet Take 1 tablet by mouth 3 (three) times daily as needed.  . Metoprolol Tartrate 75 MG TABS Take 75 mg by mouth every 12 (twelve) hours.  . montelukast (SINGULAIR) 10 MG tablet Take 10 mg by mouth at bedtime.  . nitroGLYCERIN (NITROSTAT) 0.4 MG SL tablet Place 1 tablet (0.4 mg total) under the tongue every 5 (five) minutes as needed for chest pain.  Marland Kitchen. omeprazole (PRILOSEC) 20 MG capsule Take 1 capsule (20 mg total) by mouth daily.  . pregabalin (LYRICA) 200 MG capsule Take 200 mg by mouth 3 (three) times daily.   . promethazine (PHENERGAN) 25 MG tablet Take 25 mg by mouth every 6 (six) hours as needed for nausea or vomiting.  . traZODone (DESYREL) 50 MG tablet Take 25-50 mg by mouth at bedtime as needed for sleep.      Family History: The patient's family history includes Brain cancer in her sister; HIV in her sister; Healthy in her brother; Hypertension in her mother; Liver disease in her brother; Thyroid disease in her mother.   ROS:   Please see the history of present illness.     All other systems reviewed and are  negative.   Labs/Other Tests and Data Reviewed:     Recent Labs: 01/11/2018: Magnesium 1.9 01/16/2018: BUN 10; Creatinine, Ser 0.67; Hemoglobin 9.2; Platelets 241; Potassium 3.9; Sodium 135 03/10/2018: ALT 49  Recent Lipid Panel    Component Value Date/Time   CHOL 128 03/10/2018 1615   TRIG 168 (H) 03/10/2018 1615   HDL 44 03/10/2018 1615   CHOLHDL 2.9 03/10/2018 1615   CHOLHDL 3.0 10/31/2016 0129   VLDL 10 10/31/2016 0129   LDLCALC 50 03/10/2018 1615      Exam:    Vital Signs:  There were no vitals taken for this visit.    Wt Readings from Last 3 Encounters:  10/31/18 149 lb (67.6 kg)  06/19/18 141 lb 15.6 oz (64.4 kg)  06/14/18 141 lb (64 kg)     Well nourished, well developed in no acute distress. Alert oriented x3 happy to be able to talk to me denies have any  chest pain tightness squeezing pressure burning chest however she move her shoulder it hurts she said holding her phone when she talks to me hurts  Diagnosis for this visit:   1. S/P CABG x 4   2. 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 Benign essential HTN   4. Cervicalgia   5. Atypical chest pain      ASSESSMENT & PLAN:    1.  Status post coronary bypass graft stable last stress test in December negative, will continue with medication which includes aspirin as well as high intensity statin 2. Essential hypertension blood pressure well controlled continue present management 3.  Atypical chest pain I suspect it is not related to her heart.  She is getting excellent physical therapy hopefully that will help  COVID-19 Education: The signs and symptoms of COVID-19 were discussed with the patient and how to seek care for testing (follow up with PCP or arrange E-visit).  The importance of social distancing was discussed today.  Patient Risk:   After full review of this patients clinical status, I feel that they are at least moderate risk at this  time.  Time:   Today, I have spent 15 minutes with the patient with telehealth technology discussing pt health issues.  I spent 5 minutes reviewing her chart before the visit.  Visit was finished at 4:55 PM.    Medication Adjustments/Labs and Tests Ordered: Current medicines are reviewed at length with the patient today.  Concerns regarding medicines are outlined above.  No orders of the defined types were placed in this encounter.  Medication changes: No orders of the defined types were placed in this encounter.    Disposition: Follow-up 3 months  Signed, Park Liter, MD, [facility] 11/17/2018 4:54 PM    [place]

## 2018-12-04 DIAGNOSIS — R8290 Unspecified abnormal findings in urine: Secondary | ICD-10-CM | POA: Diagnosis not present

## 2018-12-04 DIAGNOSIS — D509 Iron deficiency anemia, unspecified: Secondary | ICD-10-CM | POA: Diagnosis not present

## 2018-12-04 DIAGNOSIS — J0110 Acute frontal sinusitis, unspecified: Secondary | ICD-10-CM | POA: Diagnosis not present

## 2018-12-06 DIAGNOSIS — H9202 Otalgia, left ear: Secondary | ICD-10-CM | POA: Diagnosis not present

## 2018-12-06 DIAGNOSIS — J301 Allergic rhinitis due to pollen: Secondary | ICD-10-CM | POA: Diagnosis not present

## 2018-12-12 DIAGNOSIS — M542 Cervicalgia: Secondary | ICD-10-CM | POA: Diagnosis not present

## 2018-12-12 DIAGNOSIS — M5030 Other cervical disc degeneration, unspecified cervical region: Secondary | ICD-10-CM | POA: Diagnosis not present

## 2018-12-12 DIAGNOSIS — M7918 Myalgia, other site: Secondary | ICD-10-CM | POA: Diagnosis not present

## 2018-12-12 DIAGNOSIS — M47812 Spondylosis without myelopathy or radiculopathy, cervical region: Secondary | ICD-10-CM | POA: Diagnosis not present

## 2018-12-29 ENCOUNTER — Other Ambulatory Visit: Payer: Self-pay

## 2018-12-29 DIAGNOSIS — Z20822 Contact with and (suspected) exposure to covid-19: Secondary | ICD-10-CM

## 2018-12-29 DIAGNOSIS — G894 Chronic pain syndrome: Secondary | ICD-10-CM | POA: Diagnosis not present

## 2018-12-29 DIAGNOSIS — G8918 Other acute postprocedural pain: Secondary | ICD-10-CM | POA: Diagnosis not present

## 2018-12-29 DIAGNOSIS — K625 Hemorrhage of anus and rectum: Secondary | ICD-10-CM | POA: Diagnosis not present

## 2018-12-29 DIAGNOSIS — R0789 Other chest pain: Secondary | ICD-10-CM | POA: Diagnosis not present

## 2018-12-29 DIAGNOSIS — F4323 Adjustment disorder with mixed anxiety and depressed mood: Secondary | ICD-10-CM | POA: Diagnosis not present

## 2018-12-29 DIAGNOSIS — G4700 Insomnia, unspecified: Secondary | ICD-10-CM | POA: Diagnosis not present

## 2018-12-31 LAB — NOVEL CORONAVIRUS, NAA: SARS-CoV-2, NAA: NOT DETECTED

## 2019-01-10 NOTE — Progress Notes (Signed)
Virtual Visit via Video Note   This visit type was conducted due to national recommendations for restrictions regarding the COVID-19 Pandemic (e.g. social distancing) in an effort to limit this patient's exposure and mitigate transmission in our community.  Due to her co-morbid illnesses, this patient is at least at moderate risk for complications without adequate follow up.  This format is felt to be most appropriate for this patient at this time.  All issues noted in this document were discussed and addressed.  [name] limited physical exam was performed with this format.  Please refer to the patient's chart for her consent to telehealth for [facility].  Evaluation Performed:  Follow-up visit  This visit type was conducted due to national recommendations for restrictions regarding the COVID-19 Pandemic (e.g. social distancing).  This format is felt to be most appropriate for this patient at this time.  All issues noted in this document were discussed and addressed.  No physical exam was performed (except for noted visual exam findings with Video Visits).  Please refer to the patient's chart (MyChart message for video visits and phone note for telephone visits) for the patient's consent to telehealth [facility].  Date:  01/10/2019  ID: Diana MuscaPamela M [name], DOB 09/03/1955, MRN [id number]   Patient Location: [address] [name] [id number]   Provider location:   [facility] [place] [facility]  PCP:  Porfirio OarJeffery, [place], PA  Cardiologist:  Gypsy Balsamobert Azyah Flett, MD     Chief Complaint: Still having some atypical chest pain  History of Present Illness:    Diana Muscaamela M Roel is [name] [age] y.o. female  who presents via audio/video conferencing for [name] telehealth visit today.  With history of coronary artery disease status post coronary artery bypass graft, hypertension, hyperlipidemia.  She is been complaining of atypical chest pain.  We been trying to manage this problem medically however with  limited success today she complained of having some chest pain not related to exercise pressing chest will make this pain worse.   The patient does not have symptoms concerning for COVID-19 infection (fever, chills, cough, or new SHORTNESS OF BREATH).    Prior CV studies:   The following studies were reviewed today:       Past Medical History:  Diagnosis Date  . Allergy   . Arthritis    feet  . Bronchitis 10/30/2016  . Carpal tunnel syndrome   . Coronary artery disease   . GERD (gastroesophageal reflux disease)   . Hyperlipidemia   . Hypertension   . Non-ST elevation (NSTEMI) myocardial infarction (HCC) 10/30/2016  . NSTEMI (non-ST elevated myocardial infarction) (HCC) 10/30/2016    Past Surgical History:  Procedure Laterality Date  . CARPAL TUNNEL RELEASE Left 02/12/2016   Procedure: LEFT CARPAL TUNNEL RELEASE;  Surgeon: Diana SaltGary Kuzma, MD;  Location: [facility];  Service: Orthopedics;  Laterality: Left;  FAB  . CORONARY ARTERY BYPASS GRAFT N/[name] 01/10/2018   Procedure: CORONARY ARTERY BYPASS GRAFTING (CABG) TIMES 4, USING LEFT INTERNAL MAMMARY ARTERY TO OM1  AND ENDOSCOPICALLY HARVESTED RIGHT SAPHENOUS VEIN TO DIAGONAL, AND SEQUENTIALLY TO OM2 AND DISTAL CIRCUMFLEX;  Surgeon: Diana [name], Diana B, MD;  [facility];  Service: Open Heart Surgery;  Laterality: N/[name];  . LEFT HEART CATH AND CORONARY ANGIOGRAPHY N/[name] 01/05/2018   Procedure: LEFT HEART CATH AND CORONARY ANGIOGRAPHY;  Surgeon: Diana [name], Diana A, MD;  [facility];  Service: Cardiovascular;  Laterality: N/[name];  . TEE WITHOUT CARDIOVERSION N/[name] 01/10/2018  Procedure: TRANSESOPHAGEAL ECHOCARDIOGRAM (TEE);  Surgeon: Diana Isaac, MD;  Location: [facility];  Service: Open Heart Surgery;  Laterality: N/[name];  . TONSILLECTOMY    . TUBAL LIGATION    . WRIST SURGERY Right      Current Meds  Medication Sig  . acetaminophen (TYLENOL) 500 MG tablet Take 500 mg by mouth every 6 (six) hours as needed.  .  ALPRAZolam (XANAX) 0.5 MG tablet Take 1 tablet (0.5 mg total) by mouth 2 (two) times daily as needed for anxiety.  Marland Kitchen aspirin EC 81 MG tablet Take 81 mg by mouth daily.  Marland Kitchen atorvastatin (LIPITOR) 80 MG tablet Take 0.5 tablets (40 mg total) by mouth daily at 6 PM.  . cetirizine (ZYRTEC) 10 MG tablet Take 10 mg by mouth daily.  . DULoxetine (CYMBALTA) 30 MG capsule Take 30 mg by mouth daily.  . Metoprolol Tartrate 75 MG TABS Take 75 mg by mouth every 12 (twelve) hours.  . montelukast (SINGULAIR) 10 MG tablet Take 10 mg by mouth at bedtime.  . nitroGLYCERIN (NITROSTAT) 0.4 MG SL tablet Place 1 tablet (0.4 mg total) under the tongue every 5 (five) minutes as needed for chest pain.  Marland Kitchen omeprazole (PRILOSEC) 20 MG capsule Take 1 capsule (20 mg total) by mouth daily.  . pregabalin (LYRICA) 200 MG capsule Take 200 mg by mouth 3 (three) times daily.   . promethazine (PHENERGAN) 25 MG tablet Take 25 mg by mouth every 6 (six) hours as needed for nausea or vomiting.  . traZODone (DESYREL) 50 MG tablet Take 25-50 mg by mouth at bedtime as needed for sleep.      Family History: The patient's family history includes Brain cancer in her sister; HIV in her sister; Healthy in her brother; Hypertension in her mother; Liver disease in her brother; Thyroid disease in her mother.   ROS:   Please see the history of present illness.     All other systems reviewed and are negative.   Labs/Other Tests and Data Reviewed:     Recent Labs: 01/11/2018: Magnesium 1.9 01/16/2018: BUN 10; Creatinine, Ser 0.67; Hemoglobin 9.2; Platelets 241; Potassium 3.9; Sodium 135 03/10/2018: ALT 49  Recent Lipid Panel    Component Value Date/Time   CHOL 128 03/10/2018 1615   TRIG 168 (H) 03/10/2018 1615   HDL 44 03/10/2018 1615   CHOLHDL 2.9 03/10/2018 1615   CHOLHDL 3.0 10/31/2016 0129   VLDL 10 10/31/2016 0129   LDLCALC 50 03/10/2018 1615      Exam:    Vital Signs:  BP 106/79   Pulse 65   Wt 149 lb (67.6 kg)   BMI  27.25 kg/m     Wt Readings from Last 3 Encounters:  10/31/18 149 lb (67.6 kg)  06/19/18 141 lb 15.6 oz (64.4 kg)  06/14/18 141 lb (64 kg)     Well nourished, well developed in no acute distress. Alert awake and attentive 3 complaining of having chest pain on and off this is something that she gets chronically.  Diagnosis for this visit:   1. S/P CABG x 4   2. Hyperlipidemia, unspecified hyperlipidemia type   3. 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 Atypical chest pain   5. Benign essential HTN      ASSESSMENT & PLAN:    1.  Status post coronary artery bypass graft overall seems to be doing well.  Continue present management. 2.  Hyperlipidemia continue with statin.  3.  Coronary artery disease noted. 4.  Atypical chest pain doubt very much this is related to her heart asked her to start using lidocaine ointment on the chest area. 5.  Benign essential hypertension well-controlled.  COVID-19 Education: The signs and symptoms of COVID-19 were discussed with the patient and how to seek care for testing (follow up with PCP or arrange E-visit).  The importance of social distancing was discussed today.  Patient Risk:   After full review of this patients clinical status, I feel that they are at least moderate risk at this time.  Time:   Today, I have spent 15 minutes with the patient with telehealth technology discussing pt health issues.  I spent 5 minutes reviewing her chart before the visit.     Medication Adjustments/Labs and Tests Ordered: Current medicines are reviewed at length with the patient today.  Concerns regarding medicines are outlined above.  No orders of the defined types were placed in this encounter.  Medication changes: No orders of the defined types were placed in this encounter.    Disposition: Follow-up 1 month  Signed, Georgeanna Leaobert J. Mouhamadou Gittleman, MD, [facility] 10/31/2018 11:18 AM      [facility]

## 2019-01-15 DIAGNOSIS — H5213 Myopia, bilateral: Secondary | ICD-10-CM | POA: Diagnosis not present

## 2019-01-26 DIAGNOSIS — R0789 Other chest pain: Secondary | ICD-10-CM | POA: Diagnosis not present

## 2019-01-26 DIAGNOSIS — G894 Chronic pain syndrome: Secondary | ICD-10-CM | POA: Diagnosis not present

## 2019-01-26 DIAGNOSIS — F4323 Adjustment disorder with mixed anxiety and depressed mood: Secondary | ICD-10-CM | POA: Diagnosis not present

## 2019-01-26 DIAGNOSIS — G8918 Other acute postprocedural pain: Secondary | ICD-10-CM | POA: Diagnosis not present

## 2019-01-26 DIAGNOSIS — M542 Cervicalgia: Secondary | ICD-10-CM | POA: Diagnosis not present

## 2019-01-29 DIAGNOSIS — Z0000 Encounter for general adult medical examination without abnormal findings: Secondary | ICD-10-CM | POA: Diagnosis not present

## 2019-01-29 DIAGNOSIS — E559 Vitamin D deficiency, unspecified: Secondary | ICD-10-CM | POA: Diagnosis not present

## 2019-01-29 DIAGNOSIS — D509 Iron deficiency anemia, unspecified: Secondary | ICD-10-CM | POA: Diagnosis not present

## 2019-01-29 DIAGNOSIS — R7989 Other specified abnormal findings of blood chemistry: Secondary | ICD-10-CM | POA: Diagnosis not present

## 2019-01-29 DIAGNOSIS — E785 Hyperlipidemia, unspecified: Secondary | ICD-10-CM | POA: Diagnosis not present

## 2019-02-12 ENCOUNTER — Ambulatory Visit: Payer: BLUE CROSS/BLUE SHIELD | Admitting: Cardiology

## 2019-02-13 ENCOUNTER — Ambulatory Visit: Payer: BLUE CROSS/BLUE SHIELD | Admitting: Cardiology

## 2019-02-13 DIAGNOSIS — R0789 Other chest pain: Secondary | ICD-10-CM | POA: Diagnosis not present

## 2019-02-13 DIAGNOSIS — Z0000 Encounter for general adult medical examination without abnormal findings: Secondary | ICD-10-CM | POA: Diagnosis not present

## 2019-02-13 DIAGNOSIS — I10 Essential (primary) hypertension: Secondary | ICD-10-CM | POA: Diagnosis not present

## 2019-02-13 DIAGNOSIS — Z124 Encounter for screening for malignant neoplasm of cervix: Secondary | ICD-10-CM | POA: Diagnosis not present

## 2019-02-13 DIAGNOSIS — Z23 Encounter for immunization: Secondary | ICD-10-CM | POA: Diagnosis not present

## 2019-02-28 DIAGNOSIS — Z1211 Encounter for screening for malignant neoplasm of colon: Secondary | ICD-10-CM | POA: Diagnosis not present

## 2019-03-12 ENCOUNTER — Encounter: Payer: Self-pay | Admitting: Cardiology

## 2019-03-12 ENCOUNTER — Other Ambulatory Visit: Payer: Self-pay

## 2019-03-12 ENCOUNTER — Ambulatory Visit (INDEPENDENT_AMBULATORY_CARE_PROVIDER_SITE_OTHER): Payer: Medicaid Other | Admitting: Cardiology

## 2019-03-12 VITALS — BP 138/70 | HR 51 | Ht 62.0 in | Wt 146.8 lb

## 2019-03-12 DIAGNOSIS — Z951 Presence of aortocoronary bypass graft: Secondary | ICD-10-CM | POA: Diagnosis not present

## 2019-03-12 DIAGNOSIS — I25759 Atherosclerosis of native coronary artery of transplanted heart with unspecified angina pectoris: Secondary | ICD-10-CM

## 2019-03-12 DIAGNOSIS — R0789 Other chest pain: Secondary | ICD-10-CM | POA: Diagnosis not present

## 2019-03-12 DIAGNOSIS — E785 Hyperlipidemia, unspecified: Secondary | ICD-10-CM

## 2019-03-12 DIAGNOSIS — M542 Cervicalgia: Secondary | ICD-10-CM

## 2019-03-12 NOTE — Patient Instructions (Signed)
Medication Instructions:  Your physician recommends that you continue on your current medications as directed. Please refer to the Current Medication list given to you today.  If you need a refill on your cardiac medications before your next appointment, please call your pharmacy.   Lab work: None  If you have labs (blood work) drawn today and your tests are completely normal, you will receive your results only by: . MyChart Message (if you have MyChart) OR . A paper copy in the mail If you have any lab test that is abnormal or we need to change your treatment, we will call you to review the results.  Testing/Procedures: Your physician has requested that you have an echocardiogram. Echocardiography is a painless test that uses sound waves to create images of your heart. It provides your doctor with information about the size and shape of your heart and how well your heart's chambers and valves are working. This procedure takes approximately one hour. There are no restrictions for this procedure.   Follow-Up: At [facility], you and your health needs are our priority.  As part of our continuing mission to provide you with exceptional heart care, we have created designated Provider Care Teams.  These Care Teams include your primary Cardiologist (physician) and Advanced Practice Providers (APPs -  Physician Assistants and Nurse Practitioners) who all work together to provide you with the care you need, when you need it. You will need a follow up appointment in 3 months.  Please call our office 2 months in advance to schedule this appointment.  You may see No primary care provider on file. or another member of our [facility] Provider Team in [place]: Brian Munley, MD . Rajan Revankar, MD  Any Other Special Instructions Will Be Listed Below (If Applicable).   Echocardiogram An echocardiogram is a procedure that uses painless sound waves (ultrasound) to produce an image of the heart.  Images from an echocardiogram can provide important information about:  Signs of coronary artery disease (CAD).  Aneurysm detection. An aneurysm is a weak or damaged part of an artery wall that bulges out from the normal force of blood pumping through the body.  Heart size and shape. Changes in the size or shape of the heart can be associated with certain conditions, including heart failure, aneurysm, and CAD.  Heart muscle function.  Heart valve function.  Signs of a past heart attack.  Fluid buildup around the heart.  Thickening of the heart muscle.  A tumor or infectious growth around the heart valves. Tell a health care provider about:  Any allergies you have.  All medicines you are taking, including vitamins, herbs, eye drops, creams, and over-the-counter medicines.  Any blood disorders you have.  Any surgeries you have had.  Any medical conditions you have.  Whether you are pregnant or may be pregnant. What are the risks? Generally, this is a safe procedure. However, problems may occur, including:  Allergic reaction to dye (contrast) that may be used during the procedure. What happens before the procedure? No specific preparation is needed. You may eat and drink normally. What happens during the procedure?   An IV tube may be inserted into one of your veins.  You may receive contrast through this tube. A contrast is an injection that improves the quality of the pictures from your heart.  A gel will be applied to your chest.  A wand-like tool (transducer) will be moved over your chest. The gel will help to   transmit the sound waves from the transducer.  The sound waves will harmlessly bounce off of your heart to allow the heart images to be captured in real-time motion. The images will be recorded on a computer. The procedure may vary among health care providers and hospitals. What happens after the procedure?  You may return to your normal, everyday life,  including diet, activities, and medicines, unless your health care provider tells you not to do that. Summary  An echocardiogram is a procedure that uses painless sound waves (ultrasound) to produce an image of the heart.  Images from an echocardiogram can provide important information about the size and shape of your heart, heart muscle function, heart valve function, and fluid buildup around your heart.  You do not need to do anything to prepare before this procedure. You may eat and drink normally.  After the echocardiogram is completed, you may return to your normal, everyday life, unless your health care provider tells you not to do that. This information is not intended to replace advice given to you by your health care provider. Make sure you discuss any questions you have with your health care provider. Document Released: 05/14/2000 Document Revised: 09/07/2018 Document Reviewed: 06/19/2016 Elsevier Patient Education  2020 Elsevier Inc.    

## 2019-03-12 NOTE — Addendum Note (Signed)
Addended by: Ashok Norris on: 03/12/2019 04:47 PM   Modules accepted: Orders

## 2019-03-12 NOTE — Progress Notes (Signed)
Cardiology Office Note:    Date:  03/12/2019   ID:  Diana [name], DOB [birth year], MRN [id number]  PCP:  Porfirio Oar, PA  Cardiologist:  Gypsy Balsam, MD    Referring MD: Porfirio Oar, PA   Chief Complaint  Patient presents with  . Follow-up  Still have chest pain which is very atypical, reproducible by pressing chest wall but also reports to have some shortness of breath with exertion  History of Present Illness:    Diana [name] is a 63 y.o. female history of coronary artery disease status post coronary bypass graft since that time complained of having a lot of chest tightness.  However the pain is reproducible by pressing chest wall.  Also she did have some cervical spine issues which she did receive rehab and feels better because of this.   Past Medical History:  Diagnosis Date  . Allergy   . Arthritis    feet  . Bronchitis 10/30/2016  . Carpal tunnel syndrome   . Coronary artery disease   . GERD (gastroesophageal reflux disease)   . Hyperlipidemia   . Hypertension   . Non-ST elevation (NSTEMI) myocardial infarction (HCC) 10/30/2016  . NSTEMI (non-ST elevated myocardial infarction) (HCC) 10/30/2016    Past Surgical History:  Procedure Laterality Date  . CARPAL TUNNEL RELEASE Left 02/12/2016   Procedure: LEFT CARPAL TUNNEL RELEASE;  Surgeon: Cindee Salt, MD;  Location: [facility];  Service: Orthopedics;  Laterality: Left;  FAB  . CORONARY ARTERY BYPASS GRAFT N/A 01/10/2018   Procedure: CORONARY ARTERY BYPASS GRAFTING (CABG) TIMES 4, USING LEFT INTERNAL MAMMARY ARTERY TO OM1  AND ENDOSCOPICALLY HARVESTED RIGHT SAPHENOUS VEIN TO DIAGONAL, AND SEQUENTIALLY TO OM2 AND DISTAL CIRCUMFLEX;  Surgeon: Delight Ovens, MD;  [facility];  Service: Open Heart Surgery;  Laterality: N/A;  . LEFT HEART CATH AND CORONARY ANGIOGRAPHY N/A 01/05/2018   Procedure: LEFT HEART CATH AND CORONARY ANGIOGRAPHY;  Surgeon: Lennette Bihari, MD;  [facility];   Service: Cardiovascular;  Laterality: N/A;  . TEE WITHOUT CARDIOVERSION N/A 01/10/2018   Procedure: TRANSESOPHAGEAL ECHOCARDIOGRAM (TEE);  Surgeon: Delight Ovens, MD;  Location: [facility] OR;  Service: Open Heart Surgery;  Laterality: N/A;  . TONSILLECTOMY    . TUBAL LIGATION    . WRIST SURGERY Right     Current Medications: Current Meds  Medication Sig  . acetaminophen (TYLENOL) 500 MG tablet Take 500 mg by mouth every 6 (six) hours as needed.  . ALPRAZolam (XANAX) 0.5 MG tablet Take 1 tablet (0.5 mg total) by mouth 2 (two) times daily as needed for anxiety.  Marland Kitchen aspirin EC 81 MG tablet Take 81 mg by mouth daily.  Marland Kitchen atorvastatin (LIPITOR) 80 MG tablet Take 0.5 tablets (40 mg total) by mouth daily at 6 PM.  . cetirizine (ZYRTEC) 10 MG tablet Take 10 mg by mouth daily.  . DULoxetine (CYMBALTA) 30 MG capsule Take 30 mg by mouth daily.  . ferrous gluconate (FERGON) 324 MG tablet Take 1 tablet by mouth daily.  Marland Kitchen lisinopril (ZESTRIL) 5 MG tablet Take 1 tablet by mouth daily.  . meloxicam (MOBIC) 7.5 MG tablet Take 1 tablet by mouth daily.  . Metoprolol Tartrate 75 MG TABS Take 75 mg by mouth every 12 (twelve) hours.  . montelukast (SINGULAIR) 10 MG tablet Take 10 mg by mouth at bedtime.  . nitroGLYCERIN (NITROSTAT) 0.4 MG SL tablet Place 1 tablet (0.4 mg total) under the tongue every 5 (five) minutes as  needed for chest pain.  Marland Kitchen omeprazole (PRILOSEC) 20 MG capsule Take 1 capsule (20 mg total) by mouth daily.  . pregabalin (LYRICA) 200 MG capsule Take 200 mg by mouth 3 (three) times daily.   . promethazine (PHENERGAN) 25 MG tablet Take 25 mg by mouth every 6 (six) hours as needed for nausea or vomiting.  . traZODone (DESYREL) 50 MG tablet Take 25-50 mg by mouth at bedtime as needed for sleep.  . Vitamin D, Cholecalciferol, 50 MCG (2000 UT) CAPS Take 1 capsule by mouth daily.     Allergies:   Contrast media [iodinated diagnostic agents], Penicillins, and Codeine   Social History   Socioeconomic  History  . Marital status: Single    Spouse name: Danae Chen  . Number of children: 2  . Years of education: 12th grade  . Highest education level: Not on file  Occupational History  . Occupation: Medical illustrator: [employer]  Social Needs  . Financial resource strain: Not on file  . Food insecurity    Worry: Not on file    Inability: Not on file  . Transportation needs    Medical: Not on file    Non-medical: Not on file  Tobacco Use  . Smoking status: Never Smoker  . Smokeless tobacco: Never Used  Substance and Sexual Activity  . Alcohol use: No  . Drug use: No  . Sexual activity: Not Currently  Lifestyle  . Physical activity    Days per week: Not on file    Minutes per session: Not on file  . Stress: Not on file  Relationships  . Social Herbalist on phone: Not on file    Gets together: Not on file    Attends religious service: Not on file    Active member of club or organization: Not on file    Attends meetings of clubs or organizations: Not on file    Relationship status: Not on file  Other Topics Concern  . Not on file  Social History Narrative   Divorced from her husband. She has two adult sons from that union . One lives in [place], the other lives in [place] (as a Music therapist) following [employer].   Married Danae Chen 05/2014.     Family History: The patient's family history includes Brain cancer in her sister; HIV in her sister; Healthy in her brother; Hypertension in her mother; Liver disease in her brother; Thyroid disease in her mother. ROS:   Please see the history of present illness.    All 14 point review of systems negative except as described per history of present illness  EKGs/Labs/Other Studies Reviewed:      Recent Labs: No results found for requested labs within last 8760 hours.  Recent Lipid Panel    Component Value Date/Time   CHOL 128 03/10/2018 1615   TRIG 168 (H) 03/10/2018 1615   HDL 44 03/10/2018 1615   CHOLHDL 2.9  03/10/2018 1615   CHOLHDL 3.0 10/31/2016 0129   VLDL 10 10/31/2016 0129   LDLCALC 50 03/10/2018 1615    Physical Exam:    VS:  BP 138/70   Pulse (!) 51   Ht 5\' 2"  (1.575 m)   Wt 146 lb 12.8 oz (66.6 kg)   SpO2 97%   BMI 26.85 kg/m     Wt Readings from Last 3 Encounters:  03/12/19 146 lb 12.8 oz (66.6 kg)  10/31/18 149 lb (67.6 kg)  06/19/18 141 lb 15.6  oz (64.4 kg)     GEN:  Well nourished, well developed in no acute distress HEENT: Normal NECK: No JVD; No carotid bruits LYMPHATICS: No lymphadenopathy CARDIAC: RRR, no murmurs, no rubs, no gallops RESPIRATORY:  Clear to auscultation without rales, wheezing or rhonchi  ABDOMEN: Soft, non-tender, non-distended MUSCULOSKELETAL:  No edema; No deformity  SKIN: Warm and dry LOWER EXTREMITIES: no swelling NEUROLOGIC:  Alert and oriented x 3 PSYCHIATRIC:  Normal affect   ASSESSMENT:    1. 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 S/P CABG x 4   3. Hyperlipidemia, unspecified hyperlipidemia type   4. Cervicalgia   5. Atypical chest pain    PLAN:    In order of problems listed above:  1. Coronary disease stable from that point review. 2. Status post coronary bypass graft will do echocardiogram to assess left ventricular ejection fraction since she does have some shortness of breath with exertion 3. Hyperlipidemia taking high intensity statin which I will continue, fasting lipid profile will be done 4. Atypical chest pain will refer her to rehab for potential iontophoresis.  May be kinesiotaping can be helpful as well.   Medication Adjustments/Labs and Tests Ordered: Current medicines are reviewed at length with the patient today.  Concerns regarding medicines are outlined above.  No orders of the defined types were placed in this encounter.  Medication changes: No orders of the defined types were placed in this encounter.   Signed, Georgeanna Leaobert J. Darneisha Windhorst, MD,  [facility] 03/12/2019 4:38 PM    [facility]

## 2019-03-13 ENCOUNTER — Encounter: Payer: Self-pay | Admitting: Emergency Medicine

## 2019-03-15 ENCOUNTER — Ambulatory Visit (HOSPITAL_BASED_OUTPATIENT_CLINIC_OR_DEPARTMENT_OTHER)
Admission: RE | Admit: 2019-03-15 | Discharge: 2019-03-15 | Disposition: A | Discharge status: Home / Self Care | Payer: Medicaid Other | Source: Ambulatory Visit | Attending: Cardiology | Admitting: Cardiology

## 2019-03-15 ENCOUNTER — Other Ambulatory Visit: Payer: Self-pay

## 2019-03-15 DIAGNOSIS — I25759 Atherosclerosis of native coronary artery of transplanted heart with unspecified angina pectoris: Secondary | ICD-10-CM | POA: Diagnosis not present

## 2019-03-15 NOTE — Progress Notes (Signed)
  Echocardiogram 2D Echocardiogram has been performed.  Diana [name] 03/15/2019, 1:45 PM

## 2019-03-21 ENCOUNTER — Telehealth: Payer: Self-pay | Admitting: Cardiology

## 2019-03-21 DIAGNOSIS — R0789 Other chest pain: Secondary | ICD-10-CM

## 2019-03-21 NOTE — Telephone Encounter (Signed)
Called patient, informed her of echo results and advised her we have referred her to physical therapy per Dr. Agustin Cree for chest pain. Patient verbally understands. No further questions.

## 2019-03-21 NOTE — Telephone Encounter (Signed)
Please call about ultrasound results

## 2019-03-21 NOTE — Telephone Encounter (Signed)
Left message for patient to return call.

## 2019-03-23 DIAGNOSIS — H5203 Hypermetropia, bilateral: Secondary | ICD-10-CM | POA: Diagnosis not present

## 2019-04-02 ENCOUNTER — Other Ambulatory Visit: Payer: Self-pay

## 2019-04-02 ENCOUNTER — Ambulatory Visit: Payer: Medicaid Other | Attending: Cardiology | Admitting: Physical Therapy

## 2019-04-02 DIAGNOSIS — M542 Cervicalgia: Secondary | ICD-10-CM | POA: Insufficient documentation

## 2019-04-02 DIAGNOSIS — M6281 Muscle weakness (generalized): Secondary | ICD-10-CM | POA: Insufficient documentation

## 2019-04-02 DIAGNOSIS — R293 Abnormal posture: Secondary | ICD-10-CM | POA: Insufficient documentation

## 2019-04-02 DIAGNOSIS — R29898 Other symptoms and signs involving the musculoskeletal system: Secondary | ICD-10-CM | POA: Diagnosis not present

## 2019-04-02 NOTE — Patient Instructions (Signed)
    Home exercise program created by Anjoli Diemer, PT.  For questions, please contact Kiyona Mcnall via phone at [phone] or email at [email or web address]  [facility] [place] [address]  [address] [place], [place], [address] Phone: [phone]   Fax:  [phone]    

## 2019-04-02 NOTE — Therapy (Addendum)
[facility] [place] [address]  [place], [place], [address] Phone: [phone]   Fax:  [phone]  Physical Therapy Evaluation  Patient Details  Name: Diana [name] MRN: [id number] Date of Birth: 61-29-1957 Referring Provider (PT): Jenne Campus, MD   Encounter Date: 04/02/2019  PT End of Session - 04/02/19 0802    Visit Number  1    Number of Visits  16    Date for PT Re-Evaluation  05/28/19    Authorization Type  Medicaid    PT Start Time  0802    PT Stop Time  0848    PT Time Calculation (min)  46 min    Activity Tolerance  Patient tolerated treatment well    Behavior During Therapy  [facility] for tasks assessed/performed       Past Medical History:  Diagnosis Date  . Allergy   . Arthritis    feet  . Bronchitis 10/30/2016  . Carpal tunnel syndrome   . Coronary artery disease   . GERD (gastroesophageal reflux disease)   . Hyperlipidemia   . Hypertension   . Non-ST elevation (NSTEMI) myocardial infarction ([place]) 10/30/2016  . NSTEMI (non-ST elevated myocardial infarction) ([place]) 10/30/2016    Past Surgical History:  Procedure Laterality Date  . CARPAL TUNNEL RELEASE Left 02/12/2016   Procedure: LEFT CARPAL TUNNEL RELEASE;  Surgeon: Daryll Brod, MD;  Location: [place];  Service: Orthopedics;  Laterality: Left;  FAB  . CORONARY ARTERY BYPASS GRAFT N/A 01/10/2018   Procedure: CORONARY ARTERY BYPASS GRAFTING (CABG) TIMES 4, USING LEFT INTERNAL MAMMARY ARTERY TO OM1  AND ENDOSCOPICALLY HARVESTED RIGHT SAPHENOUS VEIN TO DIAGONAL, AND SEQUENTIALLY TO OM2 AND DISTAL CIRCUMFLEX;  Surgeon: Grace Isaac, MD;  Location: [place];  Service: Open Heart Surgery;  Laterality: N/A;  . LEFT HEART CATH AND CORONARY ANGIOGRAPHY N/A 01/05/2018   Procedure: LEFT HEART CATH AND CORONARY ANGIOGRAPHY;  Surgeon: Troy Sine, MD;  Location: [place] CV [facility];  Service: Cardiovascular;  Laterality: N/A;  . TEE WITHOUT  CARDIOVERSION N/A 01/10/2018   Procedure: TRANSESOPHAGEAL ECHOCARDIOGRAM (TEE);  Surgeon: Grace Isaac, MD;  Location: [place];  Service: Open Heart Surgery;  Laterality: N/A;  . TONSILLECTOMY    . TUBAL LIGATION    . WRIST SURGERY Right     There were no vitals filed for this visit.   Subjective Assessment - 04/02/19 0806    Subjective  Pt  reporting she was referred by her cardiologist for atypical chest pain since late 2019. Had CABG x 4 on 01/10/18 and was unable to complete full course of cardiac rehab due to tachycardia. Pain currently in L anterior upper chest which spreads into upper shoulders and neck.    Pertinent History  CABG x 4 - 01/10/18    Limitations  Sitting    How long can you sit comfortably?  >30 minutes    Patient Stated Goals  "to have the pressure in my chest relieved"    Currently in Pain?  Yes    Pain Score  4     Pain Location  Chest    Pain Orientation  Left;Anterior;Upper    Pain Descriptors / Indicators  Pressure    Pain Type  Chronic pain    Pain Radiating Towards  into B shoulders & neck    Pain Onset  More than a month ago    Pain Frequency  Constant    Aggravating Factors  vacuuming, dusting, anything requring her to use her arms alot, lifting    Pain Relieving Factors  minimal relief from muscle relaxants and pain meds    Effect of Pain on Daily Activities  difficulty completing household chores; interferes with sleep         [facility] PT Assessment - 04/02/19 0802      Assessment   Medical Diagnosis  Atypical chest pain    Referring Provider (PT)  Gypsy Balsam, MD    Onset Date/Surgical Date  --   Fall 2019   Hand Dominance  Right    Next MD Visit  ~2 months    Prior Therapy  shortened course of cardiac rehab following CABG x 4       Precautions   Precautions  None      Balance Screen   Has the patient fallen in the past 6 months  Yes    How many times?  1    Has the patient had a decrease in activity level because of a fear of  falling?   No    Is the patient reluctant to leave their home because of a fear of falling?   No      Home Nurse, mental health  Private residence    Living Arrangements  Parent    Available Help at Discharge  Family    Type of Home  Apartment    Home Access  Level entry      Prior Function   Level of Independence  Independent    Vocation  Unemployed   since surgery   Leisure  online exercises 3x/wk, walking 5 days/week      Cognition   Overall Cognitive Status  Within Functional Limits for tasks assessed      Posture/Postural Control   Posture/Postural Control  Postural limitations    Postural Limitations  Forward head;Rounded Shoulders    Posture Comments  L shoulder slightly elevated      ROM / Strength   AROM / PROM / Strength  AROM;Strength      AROM   AROM Assessment Site  Cervical;Shoulder    Right/Left Shoulder  Right;Left    Right Shoulder Flexion  140 Degrees    Right Shoulder ABduction  130 Degrees    Right Shoulder Internal Rotation  --   FIR to bra   Right Shoulder External Rotation  --   FER to T3   Left Shoulder Flexion  136 Degrees    Left Shoulder ABduction  122 Degrees    Left Shoulder Internal Rotation  --   FIR to bra   Left Shoulder External Rotation  --   FER to T3   Cervical Flexion  54    Cervical Extension  42 - "pulls"    Cervical - Right Side Bend  21 - "pulls"    Cervical - Left Side Bend  18 - "pulls"    Cervical - Right Rotation  48    Cervical - Left Rotation  48      Strength   Strength Assessment Site  Shoulder    Right/Left Shoulder  Right;Left    Right Shoulder Flexion  4-/5    Right Shoulder Extension  4-/5    Right Shoulder ABduction  4/5    Right Shoulder Internal Rotation  4/5    Right Shoulder External Rotation  4-/5    Left Shoulder Flexion  4-/5    Left Shoulder Extension  4-/5  Left Shoulder ABduction  4/5    Left Shoulder Internal Rotation  4/5    Left Shoulder External Rotation  4-/5       Palpation   Palpation comment  ttp over B pecs (L>R) & UT                Objective measurements completed on examination: See above findings.      [facility] - 04/02/19 0802      Neck Exercises: Seated   Neck Retraction  10 reps;5 secs    Shoulder Rolls  Backwards;10 reps      Shoulder Exercises: Seated   Retraction  Both;10 reps    Retraction Limitations  retraction + depression      Shoulder Exercises: Lawyertretch   Corner Stretch  30 seconds;3 reps    Corner Stretch Limitations  3-way doorway pec sretch             PT Education - 04/02/19 0845    Education Details  PT eval findings, anticipated POC & intiial HEP    Person(s) Educated  Patient    Methods  Explanation;Demonstration;Handout    Comprehension  Verbalized understanding;Returned demonstration;Need further instruction       PT Short Term Goals - 04/02/19 0848      PT SHORT TERM GOAL #1   Title  Patient will be independent with initial HEP    Status  New    Target Date  04/16/19      PT SHORT TERM GOAL #2   Title  Patient to demonstrate understanding of good posture    Status  New    Target Date  04/16/19      PT SHORT TERM GOAL #3   Title  Patient to report pain reduction in frequency and intensity by >/= 25%    Status  New    Target Date  04/16/19        PT Long Term Goals - 04/02/19 0848      PT LONG TERM GOAL #1   Title  Patient will be independent with ongoing/advanced HEP    Status  New    Target Date  05/28/19      PT LONG TERM GOAL #2   Title  Patient to demonstrate appropriate posture and body mechanics needed for daily activities    Status  New    Target Date  05/28/19      PT LONG TERM GOAL #3   Title  Patient to report pain reduction in frequency and intensity by >/= 50%    Status  New    Target Date  05/28/19      PT LONG TERM GOAL #4   Title  Patient to improve cervical and B shoulder AROM to WFL/WNL without pain provocation    Status   New    Target Date  05/28/19      PT LONG TERM GOAL #5   Title  Patient to report ability to sleep as well as perform ADLs and household tasks without pain interference    Status  New    Target Date  05/28/19             Plan - 04/02/19 0848    Clinical Impression Statement  Rinaldo Cloudamela is a 63 y/o female who presents to OP PT for atypical chest pain presumed to be of musculoskeletal origin by her cardiologist. She reports h/o CABG x 4 on 01/10/18 with delayed start of cardiac rehab following surgery and  notes she was unable to complete full cardiac rehab course to increased HR during sessions. Atypical chest pain onset occurred during period of time while she was participating in cardiac rehab but has worsened since January of this year. Pain reproducible with palpation of pectoralis and UT musculature but also noted when lying on back in bed. Posture reveals forward head and rounded shoulder posture with L shoulder slightly elevated relative to R. Cervical and B shoulder elevation ROM limited due to pain with mild shoulder weakness also noted. Pain interferes with sleep and limits her ability to complete daily tasks including lifting and household chores. Raveen will benefit from skilled PT to address above deficits to decrease pain interference with normal daily activities.    Personal Factors and Comorbidities  Comorbidity 3+;Time since onset of injury/illness/exacerbation;Past/Current Experience    Comorbidities  CAD s/p NSTEMI 10/30/16 & CABG x 4 01/10/18; HTN; cervicalgia; carpal tunnel syndrome s/p CTR 02/12/16; GERD; bronchitis    Examination-Activity Limitations  Carry;Lift;Reach Overhead;Sleep    Examination-Participation Restrictions  Laundry;Shop;Cleaning    Stability/Clinical Decision Making  Evolving/Moderate complexity    Clinical Decision Making  Moderate    Rehab Potential  Good    PT Frequency  2x / week    PT Duration  8 weeks    PT Treatment/Interventions  ADLs/Self Care Home  Management;Cryotherapy;Electrical Stimulation;Iontophoresis /ml Dexamethasone;Moist Heat;Ultrasound;Functional mobility training;Therapeutic activities;Therapeutic exercise;Neuromuscular re-education;Patient/family education;Manual techniques;Scar mobilization;Passive range of motion;Dry needling;Taping;Vasopneumatic Device;Spinal Manipulations;Joint Manipulations    PT Next Visit Plan  Review initial HEP; postural stretchging and strengtheing; manual therapy including STM/MFR to for muscle spams/tightness; modalities PRN    Consulted and Agree with Plan of Care  Patient       Patient will benefit from skilled therapeutic intervention in order to improve the following deficits and impairments:  Cardiopulmonary status limiting activity, Decreased activity tolerance, Decreased mobility, Decreased range of motion, Decreased scar mobility, Decreased strength, Hypomobility, Increased fascial restricitons, Increased muscle spasms, Impaired perceived functional ability, Impaired flexibility, Impaired UE functional use, Improper body mechanics, Postural dysfunction, Pain  Visit Diagnosis: Other symptoms and signs involving the musculoskeletal system - Plan: PT plan of care cert/re-cert  Abnormal posture - Plan: PT plan of care cert/re-cert  Muscle weakness (generalized) - Plan: PT plan of care cert/re-cert     Problem List Patient Active Problem List   Diagnosis Date Noted  . Atypical chest pain 10/24/2018  . S/P CABG x 4 01/10/2018  . Hyperlipidemia 01/09/2018  . Unstable angina (HCC) 01/05/2018  . CAD (coronary artery disease), native coronary artery 12/28/2017  . Asymptomatic microscopic hematuria 11/14/2017  . Iron deficiency anemia 09/05/2017  . Cervicalgia 12/18/2015  . Benign essential HTN 04/08/2015  . Metatarsalgia of right foot 02/11/2015    Marry Guan, PT, MPT 04/02/2019, 7:26 PM  [facility] [address]  [address] [place], [place], [address] Phone: [phone]   Fax:  [phone]  Name: JALISA SACCO MRN: [id number] Date of Birth: 1955/11/23

## 2019-04-06 ENCOUNTER — Ambulatory Visit: Payer: Medicaid Other

## 2019-04-10 ENCOUNTER — Ambulatory Visit: Payer: Medicaid Other | Admitting: Physical Therapy

## 2019-04-10 ENCOUNTER — Encounter: Payer: Self-pay | Admitting: Physical Therapy

## 2019-04-10 ENCOUNTER — Other Ambulatory Visit: Payer: Self-pay

## 2019-04-10 VITALS — BP 152/90 | HR 55

## 2019-04-10 DIAGNOSIS — R293 Abnormal posture: Secondary | ICD-10-CM

## 2019-04-10 DIAGNOSIS — M542 Cervicalgia: Secondary | ICD-10-CM | POA: Diagnosis not present

## 2019-04-10 DIAGNOSIS — M6281 Muscle weakness (generalized): Secondary | ICD-10-CM

## 2019-04-10 DIAGNOSIS — R29898 Other symptoms and signs involving the musculoskeletal system: Secondary | ICD-10-CM

## 2019-04-10 NOTE — Therapy (Signed)
[facility] [place] [address]  [place], [place], [address] Phone: [phone]   Fax:  [phone]  Physical Therapy Treatment  Patient Details  Name: Diana [name] MRN: [id number] Date of Birth: 1955/07/07 Referring Provider (PT): Jenne Campus, MD   Encounter Date: 04/10/2019  PT End of Session - 04/10/19 0804    Visit Number  2    Number of Visits  16    Date for PT Re-Evaluation  05/28/19    Authorization Type  Medicaid    Authorization Time Period  04/09/19 - 04/16/19    Authorization - Visit Number  1    Authorization - Number of Visits  3    PT Start Time  0804    PT Stop Time  0859    PT Time Calculation (min)  55 min    Activity Tolerance  Patient tolerated treatment well    Behavior During Therapy  [facility] for tasks assessed/performed       Past Medical History:  Diagnosis Date  . Allergy   . Arthritis    feet  . Bronchitis 10/30/2016  . Carpal tunnel syndrome   . Coronary artery disease   . GERD (gastroesophageal reflux disease)   . Hyperlipidemia   . Hypertension   . Non-ST elevation (NSTEMI) myocardial infarction ([place]) 10/30/2016  . NSTEMI (non-ST elevated myocardial infarction) ([place]) 10/30/2016    Past Surgical History:  Procedure Laterality Date  . CARPAL TUNNEL RELEASE Left 02/12/2016   Procedure: LEFT CARPAL TUNNEL RELEASE;  Surgeon: Daryll Brod, MD;  Location: [place];  Service: Orthopedics;  Laterality: Left;  FAB  . CORONARY ARTERY BYPASS GRAFT N/A 01/10/2018   Procedure: CORONARY ARTERY BYPASS GRAFTING (CABG) TIMES 4, USING LEFT INTERNAL MAMMARY ARTERY TO OM1  AND ENDOSCOPICALLY HARVESTED RIGHT SAPHENOUS VEIN TO DIAGONAL, AND SEQUENTIALLY TO OM2 AND DISTAL CIRCUMFLEX;  Surgeon: Grace Isaac, MD;  Location: [place];  Service: Open Heart Surgery;  Laterality: N/A;  . LEFT HEART CATH AND CORONARY ANGIOGRAPHY N/A 01/05/2018   Procedure: LEFT HEART CATH AND CORONARY ANGIOGRAPHY;   Surgeon: Troy Sine, MD;  Location: [place] [facility];  Service: Cardiovascular;  Laterality: N/A;  . TEE WITHOUT CARDIOVERSION N/A 01/10/2018   Procedure: TRANSESOPHAGEAL ECHOCARDIOGRAM (TEE);  Surgeon: Grace Isaac, MD;  Location: [place];  Service: Open Heart Surgery;  Laterality: N/A;  . TONSILLECTOMY    . TUBAL LIGATION    . WRIST SURGERY Right     Vitals:   04/10/19 0808  BP: (!) 152/90  Pulse: (!) 55  SpO2: 98%    Subjective Assessment - 04/10/19 0811    Subjective  Pt reporting her pain is this morning - when her pain is up , her BP tends to be high.    Pertinent History  CABG x 4 - 01/10/18    Patient Stated Goals  "to have the pressure in my chest relieved"    Currently in Pain?  Yes    Pain Score  6     Pain Location  Chest    Pain Orientation  Left;Anterior;Upper    Pain Descriptors / Indicators  Pressure    Pain Type  Chronic pain    Pain Radiating Towards  into B shoulders & neck    Pain Frequency  Constant                       [facility] Adult PT Treatment/Exercise -  04/10/19 0804      Neck Exercises: Seated   Neck Retraction  10 reps;5 secs    Neck Retraction Limitations  cues to avoid neck flexion    Shoulder Rolls  Backwards;10 reps      Shoulder Exercises: Seated   Retraction  Both;10 reps    Retraction Weight (lbs)  cues to avoid shoulder elevation    Retraction Limitations  retraction + depression - cues to avoid shoulder shrug    Row  Both;10 reps;Theraband;Strengthening    Theraband Level (Shoulder Row)  Level 1 (Yellow)      Shoulder Exercises: Lawyertretch   Corner Stretch  30 seconds;3 reps    Research officer, political partyCorner Stretch Limitations  3-way doorway pec sretch    Cross Chest Stretch  60 seconds;1 rep    Cross Chest Stretch Limitations  "snow angel" stretch in supine      Modalities   Modalities  Moist Heat      Moist Heat Therapy   Number Minutes Moist Heat  10 Minutes    Moist Heat Location  Shoulder   L upper shoulder and chest      Manual Therapy   Manual Therapy  Soft tissue mobilization;Myofascial release;Taping    Manual therapy comments  supine    Soft tissue mobilization  L pecs (esp pec minor & medial pec major) & UT    Myofascial Release  manual TPR to L pec minor    Kinesiotex  Inhibit Muscle      Kinesiotix   Inhibit Muscle   L pec minor - 2 strips ~30% from insertion on coracoid process with 1st strip extending medially toward sternum and 2nd strip more vertically along lateral pecs              PT Education - 04/10/19 0845    Education Details  Kinesiotape wearing instructions    Person(s) Educated  Patient    Methods  Explanation;Handout    Comprehension  Verbalized understanding       PT Short Term Goals - 04/10/19 0815      PT SHORT TERM GOAL #1   Title  Patient will be independent with initial HEP    Status  On-going    Target Date  04/16/19      PT SHORT TERM GOAL #2   Title  Patient to demonstrate understanding of good posture    Status  On-going    Target Date  04/16/19      PT SHORT TERM GOAL #3   Title  Patient to report pain reduction in frequency and intensity by >/= 25%    Status  On-going    Target Date  04/16/19        PT Long Term Goals - 04/10/19 0815      PT LONG TERM GOAL #1   Title  Patient will be independent with ongoing/advanced HEP    Status  On-going    Target Date  05/28/19      PT LONG TERM GOAL #2   Title  Patient to demonstrate appropriate posture and body mechanics needed for daily activities    Status  On-going    Target Date  05/28/19      PT LONG TERM GOAL #3   Title  Patient to report pain reduction in frequency and intensity by >/= 50%    Status  On-going    Target Date  05/28/19      PT LONG TERM GOAL #4   Title  Patient to  improve cervical and B shoulder AROM to WFL/WNL without pain provocation    Status  On-going    Target Date  05/28/19      PT LONG TERM GOAL #5   Title  Patient to report ability to sleep as well as perform  ADLs and household tasks without pain interference    Status  On-going    Target Date  05/28/19            Plan - 04/10/19 0817    Clinical Impression Statement  Jeffrey noting increased pain today without known trigger. Notes no issues with HEP other than some increased pain with high doorway stretch. HEP reviewed with minimal cueing for proper technique for most exercises and instructions to defer high doorway stretch for now until better tolerate without increased pain. Continued ttp present in L pec minor and medial pec major which was addressed with STM, MFR and gentle manual TPR followed by trial of kinesiotaping with sensitive skin tape to further reduce muscle pain and hyperactivity - will assess response on next visit.    Personal Factors and Comorbidities  Comorbidity 3+;Time since onset of injury/illness/exacerbation;Past/Current Experience    Comorbidities  CAD s/p NSTEMI 10/30/16 & CABG x 4 01/10/18; HTN; cervicalgia; carpal tunnel syndrome s/p CTR 02/12/16; GERD; bronchitis    Rehab Potential  Good    PT Frequency  2x / week    PT Duration  8 weeks    PT Treatment/Interventions  ADLs/Self Care Home Management;Cryotherapy;Electrical Stimulation;Iontophoresis 4mg /ml Dexamethasone;Moist Heat;Ultrasound;Functional mobility training;Therapeutic activities;Therapeutic exercise;Neuromuscular re-education;Patient/family education;Manual techniques;Scar mobilization;Passive range of motion;Dry needling;Taping;Vasopneumatic Device;Spinal Manipulations;Joint Manipulations    PT Next Visit Plan  assess response to taping; postural stretchging and strengtheing; manual therapy including STM/MFR to for muscle spams/tightness; modalities PRN    PT Home Exercise Plan  04/02/19 - 3-way doorway pec stretch (high position deferred as of 04/10/19), reverse shoulder rolls, scap retraction, cervical retraction    Consulted and Agree with Plan of Care  Patient       Patient will benefit from skilled  therapeutic intervention in order to improve the following deficits and impairments:  Cardiopulmonary status limiting activity, Decreased activity tolerance, Decreased mobility, Decreased range of motion, Decreased scar mobility, Decreased strength, Hypomobility, Increased fascial restricitons, Increased muscle spasms, Impaired perceived functional ability, Impaired flexibility, Impaired UE functional use, Improper body mechanics, Postural dysfunction, Pain  Visit Diagnosis: Other symptoms and signs involving the musculoskeletal system  Abnormal posture  Muscle weakness (generalized)     Problem List Patient Active Problem List   Diagnosis Date Noted  . Atypical chest pain 10/24/2018  . S/P CABG x 4 01/10/2018  . Hyperlipidemia 01/09/2018  . Unstable angina (HCC) 01/05/2018  . CAD (coronary artery disease), native coronary artery 12/28/2017  . Asymptomatic microscopic hematuria 11/14/2017  . Iron deficiency anemia 09/05/2017  . Cervicalgia 12/18/2015  . Benign essential HTN 04/08/2015  . Metatarsalgia of right foot 02/11/2015    02/13/2015, PT, MPT 04/10/2019, 12:33 PM  [facility] [address]  [address] [place], [place], [place] Phone: [phone]   Fax:  [phone]  Name: SOPHIEA UEDA MRN: Patience Musca Date of Birth: Apr 27, 1956

## 2019-04-10 NOTE — Patient Instructions (Signed)
   Kinesiology tape  What is kinesiology tape?  There are many brands of kinesiology tape. KTape, Rock Tape, Body Sport, Dynamic tape, to name a few.  It is an elasticized tape designed to support the body's natural healing process. This tape provides stability and support to muscles and joints without restricting motion.  It can also help decrease swelling in the area of application.  How does it work?  The tape microscopically lifts and decompresses the skin to allow for drainage of lymph (swelling) to flow away from area, reducing inflammation. The tape has the ability to help re-educate the neuromuscular system by targeting specific receptors in the skin. The presence of the tape increases the body's awareness of posture and body mechanics.  Do not use with:  . Open wounds . Skin lesions . Adhesive allergies  In some rare cases, mild/moderate skin irritation can occur. This can include redness, itchiness, or hives. If this occurs, immediately remove tape and consult your primary care physician if symptoms are severe or do not resolve within 2 days.  Safe removal of the tape:  To remove tape safely, hold nearby skin with one hand and gentle roll tape down with other hand. You can apply oil or conditioner to tape while in shower prior to removal to loosen adhesive. DO NOT swiftly rip tape off like a band-aid, as this could cause skin tears and additional skin irritation.     For questions, please contact your therapist at:   [facility] [place] [address]  [address] [place], [place], [address] Phone: [phone]   Fax:  [phone]     

## 2019-04-12 ENCOUNTER — Encounter: Payer: Self-pay | Admitting: Physical Therapy

## 2019-04-12 ENCOUNTER — Other Ambulatory Visit: Payer: Self-pay

## 2019-04-12 ENCOUNTER — Ambulatory Visit: Payer: Medicaid Other | Admitting: Physical Therapy

## 2019-04-12 DIAGNOSIS — M542 Cervicalgia: Secondary | ICD-10-CM | POA: Diagnosis not present

## 2019-04-12 DIAGNOSIS — R293 Abnormal posture: Secondary | ICD-10-CM | POA: Diagnosis not present

## 2019-04-12 DIAGNOSIS — R29898 Other symptoms and signs involving the musculoskeletal system: Secondary | ICD-10-CM | POA: Diagnosis not present

## 2019-04-12 DIAGNOSIS — M6281 Muscle weakness (generalized): Secondary | ICD-10-CM

## 2019-04-12 NOTE — Therapy (Signed)
[facility] [place] [facility] [address]  [place], [place], [address] Phone: [phone]   Fax:  [phone]  Physical Therapy Treatment  Patient Details  Name: Diana [name] MRN: [id number] Date of Birth: 09-Mar-1956 Referring Provider (PT): Jenne Campus, MD   Encounter Date: 04/12/2019  PT End of Session - 04/12/19 0849    Visit Number  3    Number of Visits  16    Date for PT Re-Evaluation  05/28/19    Authorization Type  Medicaid    Authorization Time Period  04/09/19 - 04/16/19    Authorization - Visit Number  2    Authorization - Number of Visits  3    PT Start Time  0849    PT Stop Time  0936    PT Time Calculation (min)  47 min    Activity Tolerance  Patient tolerated treatment well    Behavior During Therapy  [facility] for tasks assessed/performed       Past Medical History:  Diagnosis Date  . Allergy   . Arthritis    feet  . Bronchitis 10/30/2016  . Carpal tunnel syndrome   . Coronary artery disease   . GERD (gastroesophageal reflux disease)   . Hyperlipidemia   . Hypertension   . Non-ST elevation (NSTEMI) myocardial infarction ([place]) 10/30/2016  . NSTEMI (non-ST elevated myocardial infarction) ([place]) 10/30/2016    Past Surgical History:  Procedure Laterality Date  . CARPAL TUNNEL RELEASE Left 02/12/2016   Procedure: LEFT CARPAL TUNNEL RELEASE;  Surgeon: Daryll Brod, MD;  Location: [place];  Service: Orthopedics;  Laterality: Left;  FAB  . CORONARY ARTERY BYPASS GRAFT N/A 01/10/2018   Procedure: CORONARY ARTERY BYPASS GRAFTING (CABG) TIMES 4, USING LEFT INTERNAL MAMMARY ARTERY TO OM1  AND ENDOSCOPICALLY HARVESTED RIGHT SAPHENOUS VEIN TO DIAGONAL, AND SEQUENTIALLY TO OM2 AND DISTAL CIRCUMFLEX;  Surgeon: Grace Isaac, MD;  Location: [facility];  Service: Open Heart Surgery;  Laterality: N/A;  . LEFT HEART CATH AND CORONARY ANGIOGRAPHY N/A 01/05/2018   Procedure: LEFT HEART CATH AND CORONARY ANGIOGRAPHY;   Surgeon: Troy Sine, MD;  Location: [facility];  Service: Cardiovascular;  Laterality: N/A;  . TEE WITHOUT CARDIOVERSION N/A 01/10/2018   Procedure: TRANSESOPHAGEAL ECHOCARDIOGRAM (TEE);  Surgeon: Grace Isaac, MD;  Location: [facility];  Service: Open Heart Surgery;  Laterality: N/A;  . TONSILLECTOMY    . TUBAL LIGATION    . WRIST SURGERY Right     There were no vitals filed for this visit.  Subjective Assessment - 04/12/19 0854    Subjective  Pt reporting some benefot from the taping for pec minor - states tape feels ready to come off this morning    Pertinent History  CABG x 4 - 01/10/18    Patient Stated Goals  "to have the pressure in my chest relieved"    Currently in Pain?  Yes    Pain Score  4     Pain Location  Chest    Pain Orientation  Left;Anterior;Upper    Pain Descriptors / Indicators  Pressure    Pain Type  Chronic pain    Multiple Pain Sites  Yes    Pain Score  7    Pain Location  Neck   & B upper shoulders   Pain Orientation  Right;Left    Pain Descriptors / Indicators  Aching    Pain Type  Chronic pain    Pain Frequency  Constant                       [facility] Adult PT Treatment/Exercise - 04/12/19 0849      Self-Care   Self-Care  Posture    Posture  Provided edcuation on neutral neck/spine and shoulder posture to reduce muscle strain      Shoulder Exercises: Standing   Row  Both;10 reps;Theraband;Strengthening    Theraband Level (Shoulder Row)  Level 1 (Yellow)    Row Limitations  cues to tuck elbows into ribs + scap retraction    Retraction  Both;10 reps;Theraband;Strengthening    Theraband Level (Shoulder Retraction)  Level 1 (Yellow)    Retraction Limitations  + mini-shoulder extension      Shoulder Exercises: ROM/Strengthening   UBE (Upper Arm Bike)  L2.0 x 6 min (3' fwd/3' back)      Manual Therapy   Manual Therapy  Soft tissue mobilization;Myofascial release;Taping    Manual therapy comments  seated    Soft tissue  mobilization  L pecs (esp pec minor & medial pec major) & UT/LS    Myofascial Release  manual TPR to L pec minor & UT    Kinesiotex  Inhibit Muscle      Kinesiotix   Inhibit Muscle   L pec minor - 2 strips ~30% from insertion on coracoid process with 1st strip extending medially toward sternum and 2nd strip more vertically along lateral pecs; B UT 30%      Neck Exercises: Stretches   Upper Trapezius Stretch  Right;Left;30 seconds;2 reps    Levator Stretch  Right;Left;30 seconds;2 reps    Other Neck Stretches  R/L SCM stretches 2 x 30 sec             PT Education - 04/12/19 0936    Education Details  Postural education for neutral neck and shoulder posture; HEP update - UT, LS & SCM stretches, yellow TB rows & retraction    Person(s) Educated  Patient    Methods  Explanation;Demonstration;Verbal cues;Handout    Comprehension  Verbalized understanding;Returned demonstration;Verbal cues required;Need further instruction       PT Short Term Goals - 04/12/19 0936      PT SHORT TERM GOAL #1   Title  Patient will be independent with initial HEP    Status  Partially Met    Target Date  04/16/19      PT SHORT TERM GOAL #2   Title  Patient to demonstrate understanding of good posture    Status  On-going    Target Date  04/16/19      PT SHORT TERM GOAL #3   Title  Patient to report pain reduction in frequency and intensity by >/= 25%    Status  On-going    Target Date  04/16/19        PT Long Term Goals - 04/10/19 0815      PT LONG TERM GOAL #1   Title  Patient will be independent with ongoing/advanced HEP    Status  On-going    Target Date  05/28/19      PT LONG TERM GOAL #2   Title  Patient to demonstrate appropriate posture and body mechanics needed for daily activities    Status  On-going    Target Date  05/28/19      PT LONG TERM GOAL #3   Title  Patient to report pain reduction in frequency and intensity by >/= 50%  Status  On-going    Target Date  05/28/19       PT LONG TERM GOAL #4   Title  Patient to improve cervical and B shoulder AROM to WFL/WNL without pain provocation    Status  On-going    Target Date  05/28/19      PT LONG TERM GOAL #5   Title  Patient to report ability to sleep as well as perform ADLs and household tasks without pain interference    Status  On-going    Target Date  05/28/19            Plan - 04/12/19 0936    Clinical Impression Statement  Tashima noting some benefit from kinesiotaping for pec minor last visit with pain reduced in the upper L chest, but reports continued increased pain in neck and B upper shoulders. Provided education in neutral neck and shoulder posture to minimize muscular strain and further addressed increased muscle tension and TPs with manual therapy including taping, stretching and scapular strengthening/stabilization to promote better posture. HEP updated accordingly with patient denying need for review of initial HEP today.    Personal Factors and Comorbidities  Comorbidity 3+;Time since onset of injury/illness/exacerbation;Past/Current Experience    Comorbidities  CAD s/p NSTEMI 10/30/16 & CABG x 4 01/10/18; HTN; cervicalgia; carpal tunnel syndrome s/p CTR 02/12/16; GERD; bronchitis    Rehab Potential  Good    PT Frequency  2x / week    PT Duration  8 weeks    PT Treatment/Interventions  ADLs/Self Care Home Management;Cryotherapy;Electrical Stimulation;Iontophoresis 86m/ml Dexamethasone;Moist Heat;Ultrasound;Functional mobility training;Therapeutic activities;Therapeutic exercise;Neuromuscular re-education;Patient/family education;Manual techniques;Scar mobilization;Passive range of motion;Dry needling;Taping;Vasopneumatic Device;Spinal Manipulations;Joint Manipulations    PT Next Visit Plan  Medicaid reauthorization; postural stretchging and strengtheing; manual therapy including STM/MFR to for muscle spams/tightness; modalities PRN    PT Home Exercise Plan  04/02/19 - 3-way doorway pec stretch  (high position deferred as of 04/10/19), reverse shoulder rolls, scap retraction, cervical retraction; 04/12/19 - UT, LS & SCM stretches, yellow TB rows & retraction    Consulted and Agree with Plan of Care  Patient       Patient will benefit from skilled therapeutic intervention in order to improve the following deficits and impairments:  Cardiopulmonary status limiting activity, Decreased activity tolerance, Decreased mobility, Decreased range of motion, Decreased scar mobility, Decreased strength, Hypomobility, Increased fascial restricitons, Increased muscle spasms, Impaired perceived functional ability, Impaired flexibility, Impaired UE functional use, Improper body mechanics, Postural dysfunction, Pain  Visit Diagnosis: Other symptoms and signs involving the musculoskeletal system  Abnormal posture  Muscle weakness (generalized)     Problem List Patient Active Problem List   Diagnosis Date Noted  . Atypical chest pain 10/24/2018  . S/P CABG x 4 01/10/2018  . Hyperlipidemia 01/09/2018  . Unstable angina ([place] 01/05/2018  . CAD (coronary artery disease), native coronary artery 12/28/2017  . Asymptomatic microscopic hematuria 11/14/2017  . Iron deficiency anemia 09/05/2017  . Cervicalgia 12/18/2015  . Benign essential HTN 04/08/2015  . Metatarsalgia of right foot 02/11/2015    JPercival Spanish PT, MPT 04/12/2019, 12:39 PM  [facility] [place] [address]Phone: [phone]  Fax:  [phone] Name: PWILMINA MAXHAMMRN: [id number] of Birth: 8Jun 08, 1957

## 2019-04-12 NOTE — Patient Instructions (Signed)
    Home exercise program created by JoAnne Kreis, PT.  For questions, please contact JoAnne via phone at [phone] or email at [email or web address]   [facility] [address]  [address] [place], [place], [address] Phone: [phone]   Fax:  [phone]    

## 2019-04-13 ENCOUNTER — Telehealth: Payer: Self-pay | Admitting: Cardiology

## 2019-04-13 DIAGNOSIS — I10 Essential (primary) hypertension: Secondary | ICD-10-CM | POA: Diagnosis not present

## 2019-04-13 DIAGNOSIS — R05 Cough: Secondary | ICD-10-CM | POA: Diagnosis not present

## 2019-04-13 NOTE — Telephone Encounter (Signed)
Went over echo results and heart rate with patient as she requested. Already informed her before but she wanted to know what her heart rate was on her echocardiogram. No further questions.

## 2019-04-13 NOTE — Telephone Encounter (Signed)
Please call with US results

## 2019-04-16 ENCOUNTER — Ambulatory Visit: Payer: Medicaid Other | Admitting: Physical Therapy

## 2019-04-16 ENCOUNTER — Other Ambulatory Visit: Payer: Self-pay

## 2019-04-16 ENCOUNTER — Encounter: Payer: Self-pay | Admitting: Physical Therapy

## 2019-04-16 VITALS — BP 154/92 | HR 59

## 2019-04-16 DIAGNOSIS — R29898 Other symptoms and signs involving the musculoskeletal system: Secondary | ICD-10-CM

## 2019-04-16 DIAGNOSIS — M542 Cervicalgia: Secondary | ICD-10-CM

## 2019-04-16 DIAGNOSIS — R293 Abnormal posture: Secondary | ICD-10-CM | POA: Diagnosis not present

## 2019-04-16 DIAGNOSIS — M6281 Muscle weakness (generalized): Secondary | ICD-10-CM

## 2019-04-16 NOTE — Therapy (Signed)
[facility] [address]  [place], [place], [address] Phone: [phone]   Fax:  [phone]  Physical Therapy Treatment  Patient Details  Name: Diana [name] MRN: [id number] Date of Birth: Nov 09, 1955 Referring Provider (PT): Jenne Campus, MD   Encounter Date: 04/16/2019  PT End of Session - 04/16/19 1404    Visit Number  4    Number of Visits  16    Date for PT Re-Evaluation  05/28/19    Authorization Type  Medicaid    Authorization Time Period  04/09/19 - 04/16/19    Authorization - Visit Number  3    Authorization - Number of Visits  3    PT Start Time  6962    PT Stop Time  1504    PT Time Calculation (min)  60 min    Activity Tolerance  Patient tolerated treatment well    Behavior During Therapy  [facility] for tasks assessed/performed       Past Medical History:  Diagnosis Date  . Allergy   . Arthritis    feet  . Bronchitis 10/30/2016  . Carpal tunnel syndrome   . Coronary artery disease   . GERD (gastroesophageal reflux disease)   . Hyperlipidemia   . Hypertension   . Non-ST elevation (NSTEMI) myocardial infarction ([place]) 10/30/2016  . NSTEMI (non-ST elevated myocardial infarction) ([place]) 10/30/2016    Past Surgical History:  Procedure Laterality Date  . CARPAL TUNNEL RELEASE Left 02/12/2016   Procedure: LEFT CARPAL TUNNEL RELEASE;  Surgeon: Daryll Brod, MD;  Location: [place];  Service: Orthopedics;  Laterality: Left;  FAB  . CORONARY ARTERY BYPASS GRAFT N/A 01/10/2018   Procedure: CORONARY ARTERY BYPASS GRAFTING (CABG) TIMES 4, USING LEFT INTERNAL MAMMARY ARTERY TO OM1  AND ENDOSCOPICALLY HARVESTED RIGHT SAPHENOUS VEIN TO DIAGONAL, AND SEQUENTIALLY TO OM2 AND DISTAL CIRCUMFLEX;  Surgeon: Grace Isaac, MD;  Location: [facility];  Service: Open Heart Surgery;  Laterality: N/A;  . LEFT HEART CATH AND CORONARY ANGIOGRAPHY N/A 01/05/2018   Procedure: LEFT HEART CATH AND CORONARY ANGIOGRAPHY;   Surgeon: Troy Sine, MD;  Location: [place] [facility];  Service: Cardiovascular;  Laterality: N/A;  . TEE WITHOUT CARDIOVERSION N/A 01/10/2018   Procedure: TRANSESOPHAGEAL ECHOCARDIOGRAM (TEE);  Surgeon: Grace Isaac, MD;  Location: [facility];  Service: Open Heart Surgery;  Laterality: N/A;  . TONSILLECTOMY    . TUBAL LIGATION    . WRIST SURGERY Right     Vitals:   04/16/19 1443  BP: (!) 154/92  Pulse: (!) 59  SpO2: 98%    Subjective Assessment - 04/16/19 1410    Subjective  Pt continues to report benefit from taping, esp upper shoulders.    Pertinent History  CABG x 4 - 01/10/18    Patient Stated Goals  "to have the pressure in my chest relieved"    Currently in Pain?  Yes    Pain Score  4     Pain Location  Chest    Pain Orientation  Left;Anterior;Upper    Pain Descriptors / Indicators  Pressure    Pain Type  Chronic pain    Pain Frequency  Intermittent    Pain Score  6    Pain Location  Neck   & upper shoulders   Pain Orientation  Right;Left    Pain Descriptors / Indicators  Aching    Pain Type  Chronic pain    Pain Frequency  Constant  [facility] PT Assessment - 04/16/19 1404      Assessment   Medical Diagnosis  Atypical chest pain    Referring Provider (PT)  Jenne Campus, MD    Onset Date/Surgical Date  --   Fall 2019   Hand Dominance  Right      AROM   Right Shoulder Flexion  139 Degrees    Right Shoulder ABduction  122 Degrees    Left Shoulder Flexion  137 Degrees    Left Shoulder ABduction  121 Degrees    Cervical Flexion  40    Cervical Extension  58 - "pulls"    Cervical - Right Side Bend  32 - "pulls"    Cervical - Left Side Bend  27 - "pulls"    Cervical - Right Rotation  61    Cervical - Left Rotation  62                   [facility] Adult PT Treatment/Exercise - 04/16/19 1404      Shoulder Exercises: Supine   Flexion  Both;10 reps;AROM    Flexion Limitations  3-5" stretch with cane      Shoulder Exercises:  ROM/Strengthening   UBE (Upper Arm Bike)  L2.0 x 6 min (3' fwd/3' back)      Neck Exercises: Stretches   Upper Trapezius Stretch  Right;Left;30 seconds;2 reps    Levator Stretch  Right;Left;30 seconds;2 reps    Other Neck Stretches  R/L SCM stretches 2 x 30 sec               PT Short Term Goals - 04/16/19 1413      PT SHORT TERM GOAL #1   Title  Patient will be independent with initial HEP    Status  Achieved   04/16/19     PT SHORT TERM GOAL #2   Title  Patient to demonstrate understanding of good posture    Status  Achieved   04/16/2019   Target Date  --      PT SHORT TERM GOAL #3   Title  Patient to report pain reduction in frequency and intensity by >/= 25%    Status  Partially Met   15% improvement noted   Target Date  04/16/19        PT Long Term Goals - 04/10/19 0815      PT LONG TERM GOAL #1   Title  Patient will be independent with ongoing/advanced HEP    Status  On-going    Target Date  05/28/19      PT LONG TERM GOAL #2   Title  Patient to demonstrate appropriate posture and body mechanics needed for daily activities    Status  On-going    Target Date  05/28/19      PT LONG TERM GOAL #3   Title  Patient to report pain reduction in frequency and intensity by >/= 50%    Status  On-going    Target Date  05/28/19      PT LONG TERM GOAL #4   Title  Patient to improve cervical and B shoulder AROM to WFL/WNL without pain provocation    Status  On-going    Target Date  05/28/19      PT LONG TERM GOAL #5   Title  Patient to report ability to sleep as well as perform ADLs and household tasks without pain interference    Status  On-going    Target Date  05/28/19  Plan - 04/16/19 1417    Clinical Impression Statement  Junell reporting 15% improvement in pain thus far with PT, most notably with chest pain reduction however still noting more intense neck and upper shoulder pain. Cervical ROM improving with less notable change thus far  in shoulder ROM. She is independent and compliant with initial HEP and reports improving postural awareness with daily tasks. All STGs met with pain goal only partially met with 15% reduction in pain vs goal of 25% reduction. Hasina will continue to benefit from skilled PT to address ongoing pain, ROM and strength to allow for decreased pain interference with normal daily tasks and sleeping.    Personal Factors and Comorbidities  Comorbidity 3+;Time since onset of injury/illness/exacerbation;Past/Current Experience    Comorbidities  CAD s/p NSTEMI 10/30/16 & CABG x 4 01/10/18; HTN; cervicalgia; carpal tunnel syndrome s/p CTR 02/12/16; GERD; bronchitis    Rehab Potential  Good    PT Frequency  2x / week    PT Duration  8 weeks    PT Treatment/Interventions  ADLs/Self Care Home Management;Cryotherapy;Electrical Stimulation;Iontophoresis 72m/ml Dexamethasone;Moist Heat;Ultrasound;Functional mobility training;Therapeutic activities;Therapeutic exercise;Neuromuscular re-education;Patient/family education;Manual techniques;Scar mobilization;Passive range of motion;Dry needling;Taping;Vasopneumatic Device;Spinal Manipulations;Joint Manipulations    PT Next Visit Plan  Medicaid reauthorization; postural stretchging and strengtheing; manual therapy including STM/MFR to for muscle spams/tightness; modalities PRN    PT Home Exercise Plan  04/02/19 - 3-way doorway pec stretch (high position deferred as of 04/10/19), reverse shoulder rolls, scap retraction, cervical retraction; 04/12/19 - UT, LS & SCM stretches, yellow TB rows & retraction    Consulted and Agree with Plan of Care  Patient       Patient will benefit from skilled therapeutic intervention in order to improve the following deficits and impairments:  Cardiopulmonary status limiting activity, Decreased activity tolerance, Decreased mobility, Decreased range of motion, Decreased scar mobility, Decreased strength, Hypomobility, Increased fascial restricitons,  Increased muscle spasms, Impaired perceived functional ability, Impaired flexibility, Impaired UE functional use, Improper body mechanics, Postural dysfunction, Pain  Visit Diagnosis: Other symptoms and signs involving the musculoskeletal system  Cervicalgia  Abnormal posture  Muscle weakness (generalized)     Problem List Patient Active Problem List   Diagnosis Date Noted  . Atypical chest pain 10/24/2018  . S/P CABG x 4 01/10/2018  . Hyperlipidemia 01/09/2018  . Unstable angina ([place] 01/05/2018  . CAD (coronary artery disease), native coronary artery 12/28/2017  . Asymptomatic microscopic hematuria 11/14/2017  . Iron deficiency anemia 09/05/2017  . Cervicalgia 12/18/2015  . Benign essential HTN 04/08/2015  . Metatarsalgia of right foot 02/11/2015    JPercival Spanish PT, MPT 04/16/2019, 7:33 PM  [facility] [place] [address]Phone: [phone]  Fax:  [phone] Name: PJENY NIELDMRN: [id number] of Birth: [birth year]

## 2019-04-17 ENCOUNTER — Encounter: Payer: Medicaid Other | Admitting: Physical Therapy

## 2019-04-20 ENCOUNTER — Other Ambulatory Visit: Payer: Self-pay

## 2019-04-20 ENCOUNTER — Encounter: Payer: Self-pay | Admitting: Physical Therapy

## 2019-04-20 ENCOUNTER — Ambulatory Visit: Payer: Medicaid Other | Admitting: Physical Therapy

## 2019-04-20 DIAGNOSIS — R293 Abnormal posture: Secondary | ICD-10-CM

## 2019-04-20 DIAGNOSIS — M542 Cervicalgia: Secondary | ICD-10-CM | POA: Diagnosis not present

## 2019-04-20 DIAGNOSIS — R29898 Other symptoms and signs involving the musculoskeletal system: Secondary | ICD-10-CM

## 2019-04-20 DIAGNOSIS — M6281 Muscle weakness (generalized): Secondary | ICD-10-CM

## 2019-04-20 NOTE — Therapy (Signed)
[facility] [address]  [place], [place], [address] Phone: [phone]   Fax:  [phone]  Physical Therapy Treatment  Patient Details  Name: Diana [name] MRN: [id number] Date of Birth: Jan 21, 1956 Referring Provider (PT): Jenne Campus, MD   Encounter Date: 04/20/2019  PT End of Session - 04/20/19 0802    Visit Number  5    Number of Visits  16    Date for PT Re-Evaluation  05/28/19    Authorization Type  Medicaid    Authorization Time Period  04/20/19 - 05/31/19    Authorization - Visit Number  1    Authorization - Number of Visits  12    PT Start Time  0802    PT Stop Time  0848    PT Time Calculation (min)  46 min    Activity Tolerance  Patient tolerated treatment well    Behavior During Therapy  [facility] for tasks assessed/performed       Past Medical History:  Diagnosis Date  . Allergy   . Arthritis    feet  . Bronchitis 10/30/2016  . Carpal tunnel syndrome   . Coronary artery disease   . GERD (gastroesophageal reflux disease)   . Hyperlipidemia   . Hypertension   . Non-ST elevation (NSTEMI) myocardial infarction ([place]) 10/30/2016  . NSTEMI (non-ST elevated myocardial infarction) ([place]) 10/30/2016    Past Surgical History:  Procedure Laterality Date  . CARPAL TUNNEL RELEASE Left 02/12/2016   Procedure: LEFT CARPAL TUNNEL RELEASE;  Surgeon: Daryll Brod, MD;  Location: [place];  Service: Orthopedics;  Laterality: Left;  FAB  . CORONARY ARTERY BYPASS GRAFT N/A 01/10/2018   Procedure: CORONARY ARTERY BYPASS GRAFTING (CABG) TIMES 4, USING LEFT INTERNAL MAMMARY ARTERY TO OM1  AND ENDOSCOPICALLY HARVESTED RIGHT SAPHENOUS VEIN TO DIAGONAL, AND SEQUENTIALLY TO OM2 AND DISTAL CIRCUMFLEX;  Surgeon: Grace Isaac, MD;  Location: [place];  Service: Open Heart Surgery;  Laterality: N/A;  . LEFT HEART CATH AND CORONARY ANGIOGRAPHY N/A 01/05/2018   Procedure: LEFT HEART CATH AND CORONARY ANGIOGRAPHY;   Surgeon: Troy Sine, MD;  Location: [place] [facility];  Service: Cardiovascular;  Laterality: N/A;  . TEE WITHOUT CARDIOVERSION N/A 01/10/2018   Procedure: TRANSESOPHAGEAL ECHOCARDIOGRAM (TEE);  Surgeon: Grace Isaac, MD;  Location: [place];  Service: Open Heart Surgery;  Laterality: N/A;  . TONSILLECTOMY    . TUBAL LIGATION    . WRIST SURGERY Right     There were no vitals filed for this visit.  Subjective Assessment - 04/20/19 0805    Subjective  Pt reporting pain exacerbated this week by helping her mother out around the house.    Pertinent History  CABG x 4 - 01/10/18    Patient Stated Goals  "to have the pressure in my chest relieved"    Currently in Pain?  Yes    Pain Score  4     Pain Orientation  Left;Anterior;Upper    Pain Descriptors / Indicators  Pressure    Pain Type  Chronic pain    Pain Frequency  Intermittent    Pain Score  7    Pain Location  Neck   & upper shoudlers   Pain Orientation  Right;Left    Pain Descriptors / Indicators  Aching    Pain Type  Chronic pain    Pain Frequency  Constant  [facility] Adult PT Treatment/Exercise - 04/20/19 0802      Exercises   Exercises  Neck      Neck Exercises: Theraband   Shoulder External Rotation  10 reps   yellow TB   Shoulder External Rotation Limitations  hooklying on pool noodle - cues for scap retraction into noodle    Horizontal ABduction  10 reps   yellow TB   Horizontal ABduction Limitations  hooklying on pool noodle - cues for scap retraction into noodle    Other Theraband Exercises  Alt UE diagonals + scap retraction with yellow TB x 5 (stopped due to cramps in hands); hooklying on pool noodle      Shoulder Exercises: ROM/Strengthening   UBE (Upper Arm Bike)  L2.0 x 6 min (3' fwd/3' back)      Shoulder Exercises: Stretch   Cross Chest Stretch  60 seconds;1 rep    Cross Chest Stretch Limitations  "snow angel" stretch over pool noodle      Manual Therapy    Manual Therapy  Soft tissue mobilization;Myofascial release;Taping    Manual therapy comments  seated    Soft tissue mobilization  L pecs (esp pec minor & medial pec major) & B UT/LS - decreasing ttp    Myofascial Release  manual TPR to L pec minor & B UT/LS    Kinesiotex  Inhibit Muscle      Kinesiotix   Inhibit Muscle   B UT & LS 30%; L pec minor - 2 strips ~30% from insertion on coracoid process with 1st strip extending medially toward sternum and 2nd strip more vertically along lateral pecs      Neck Exercises: Stretches   Upper Trapezius Stretch  Right;Left;30 seconds;2 reps    Levator Stretch  Right;Left;30 seconds;2 reps    Other Neck Stretches  R/L SCM stretches 2 x 30 sec               PT Short Term Goals - 04/16/19 1413      PT SHORT TERM GOAL #1   Title  Patient will be independent with initial HEP    Status  Achieved   04/16/19     PT SHORT TERM GOAL #2   Title  Patient to demonstrate understanding of good posture    Status  Achieved   04/16/2019   Target Date  --      PT SHORT TERM GOAL #3   Title  Patient to report pain reduction in frequency and intensity by >/= 25%    Status  Partially Met   15% improvement noted   Target Date  04/16/19        PT Long Term Goals - 04/10/19 0815      PT LONG TERM GOAL #1   Title  Patient will be independent with ongoing/advanced HEP    Status  On-going    Target Date  05/28/19      PT LONG TERM GOAL #2   Title  Patient to demonstrate appropriate posture and body mechanics needed for daily activities    Status  On-going    Target Date  05/28/19      PT LONG TERM GOAL #3   Title  Patient to report pain reduction in frequency and intensity by >/= 50%    Status  On-going    Target Date  05/28/19      PT LONG TERM GOAL #4   Title  Patient to improve cervical and B shoulder AROM to WFL/WNL without  pain provocation    Status  On-going    Target Date  05/28/19      PT LONG TERM GOAL #5   Title  Patient to  report ability to sleep as well as perform ADLs and household tasks without pain interference    Status  On-going    Target Date  05/28/19            Plan - 04/20/19 0808    Clinical Impression Statement  Davielle reporting her pain generally seems to be improving but notes some exacerbation this week from helping her mother around the house. She reports good compliance with initial HEP and yellow TB rows/retraction but requested further review of neck stretches - able to perform good return demonstration after review. Able to progress scapular strengthening and stabilization with good tolerance other than diagonals limited by cramping in hands. Treatment concluded with manual STM, MFR and TPR with decreasing ttp noted, followed by reapplication of taping at patient request adding LS as patient reporting greatest ttp over LS today.    Personal Factors and Comorbidities  Comorbidity 3+;Time since onset of injury/illness/exacerbation;Past/Current Experience    Comorbidities  CAD s/p NSTEMI 10/30/16 & CABG x 4 01/10/18; HTN; cervicalgia; carpal tunnel syndrome s/p CTR 02/12/16; GERD; bronchitis    Rehab Potential  Good    PT Frequency  2x / week    PT Duration  8 weeks    PT Treatment/Interventions  ADLs/Self Care Home Management;Cryotherapy;Electrical Stimulation;Iontophoresis 41m/ml Dexamethasone;Moist Heat;Ultrasound;Functional mobility training;Therapeutic activities;Therapeutic exercise;Neuromuscular re-education;Patient/family education;Manual techniques;Scar mobilization;Passive range of motion;Dry needling;Taping;Vasopneumatic Device;Spinal Manipulations;Joint Manipulations    PT Next Visit Plan  postural stretchging and strengtheing; manual therapy including STM/MFR to for muscle spams/tightness; modalities PRN    PT Home Exercise Plan  04/02/19 - 3-way doorway pec stretch (high position deferred as of 04/10/19), reverse shoulder rolls, scap retraction, cervical retraction; 04/12/19 - UT, LS & SCM  stretches, yellow TB rows & retraction    Consulted and Agree with Plan of Care  Patient       Patient will benefit from skilled therapeutic intervention in order to improve the following deficits and impairments:  Cardiopulmonary status limiting activity, Decreased activity tolerance, Decreased mobility, Decreased range of motion, Decreased scar mobility, Decreased strength, Hypomobility, Increased fascial restricitons, Increased muscle spasms, Impaired perceived functional ability, Impaired flexibility, Impaired UE functional use, Improper body mechanics, Postural dysfunction, Pain  Visit Diagnosis: Other symptoms and signs involving the musculoskeletal system  Cervicalgia  Abnormal posture  Muscle weakness (generalized)     Problem List Patient Active Problem List   Diagnosis Date Noted  . Atypical chest pain 10/24/2018  . S/P CABG x 4 01/10/2018  . Hyperlipidemia 01/09/2018  . Unstable angina ([place] 01/05/2018  . CAD (coronary artery disease), native coronary artery 12/28/2017  . Asymptomatic microscopic hematuria 11/14/2017  . Iron deficiency anemia 09/05/2017  . Cervicalgia 12/18/2015  . Benign essential HTN 04/08/2015  . Metatarsalgia of right foot 02/11/2015    JPercival Spanish PT, MPT 04/20/2019, 11:05 AM  [facility] [place] [address]Phone: [phone]  Fax:  [phone] Name: PARIATNA JESTERMRN: [id number] of Birth: 8October 11, 1957

## 2019-04-24 ENCOUNTER — Ambulatory Visit: Payer: Medicaid Other | Admitting: Physical Therapy

## 2019-04-30 DIAGNOSIS — I10 Essential (primary) hypertension: Secondary | ICD-10-CM | POA: Diagnosis not present

## 2019-05-01 ENCOUNTER — Ambulatory Visit: Payer: Medicaid Other | Attending: Cardiology | Admitting: Physical Therapy

## 2019-05-01 ENCOUNTER — Encounter: Payer: Self-pay | Admitting: Physical Therapy

## 2019-05-01 ENCOUNTER — Other Ambulatory Visit: Payer: Self-pay

## 2019-05-01 DIAGNOSIS — M6281 Muscle weakness (generalized): Secondary | ICD-10-CM | POA: Diagnosis not present

## 2019-05-01 DIAGNOSIS — R29898 Other symptoms and signs involving the musculoskeletal system: Secondary | ICD-10-CM | POA: Insufficient documentation

## 2019-05-01 DIAGNOSIS — R293 Abnormal posture: Secondary | ICD-10-CM

## 2019-05-01 DIAGNOSIS — M542 Cervicalgia: Secondary | ICD-10-CM

## 2019-05-01 NOTE — Therapy (Addendum)
[facility] [address]  [place], [place], [address] Phone: [phone]   Fax:  [phone]  Physical Therapy Treatment  Patient Details  Name: Diana [name] MRN: [id number] Date of Birth: February 14, 1956 Referring Provider (PT): Jenne Campus, MD   Encounter Date: 05/01/2019  PT End of Session - 05/01/19 0801    Visit Number  6    Number of Visits  16    Date for PT Re-Evaluation  05/28/19    Authorization Type  Medicaid    Authorization Time Period  04/20/19 - 05/31/19    Authorization - Visit Number  2    Authorization - Number of Visits  12    PT Start Time  0801    PT Stop Time  0906    PT Time Calculation (min)  65 min    Activity Tolerance  Patient tolerated treatment well    Behavior During Therapy  [facility] for tasks assessed/performed       Past Medical History:  Diagnosis Date  . Allergy   . Arthritis    feet  . Bronchitis 10/30/2016  . Carpal tunnel syndrome   . Coronary artery disease   . GERD (gastroesophageal reflux disease)   . Hyperlipidemia   . Hypertension   . Non-ST elevation (NSTEMI) myocardial infarction ([place]) 10/30/2016  . NSTEMI (non-ST elevated myocardial infarction) ([place]) 10/30/2016    Past Surgical History:  Procedure Laterality Date  . CARPAL TUNNEL RELEASE Left 02/12/2016   Procedure: LEFT CARPAL TUNNEL RELEASE;  Surgeon: Daryll Brod, MD;  Location: [place];  Service: Orthopedics;  Laterality: Left;  FAB  . CORONARY ARTERY BYPASS GRAFT N/A 01/10/2018   Procedure: CORONARY ARTERY BYPASS GRAFTING (CABG) TIMES 4, USING LEFT INTERNAL MAMMARY ARTERY TO OM1  AND ENDOSCOPICALLY HARVESTED RIGHT SAPHENOUS VEIN TO DIAGONAL, AND SEQUENTIALLY TO OM2 AND DISTAL CIRCUMFLEX;  Surgeon: Grace Isaac, MD;  Location: [place];  Service: Open Heart Surgery;  Laterality: N/A;  . LEFT HEART CATH AND CORONARY ANGIOGRAPHY N/A 01/05/2018   Procedure: LEFT HEART CATH AND CORONARY ANGIOGRAPHY;   Surgeon: Troy Sine, MD;  Location: [place] [facility];  Service: Cardiovascular;  Laterality: N/A;  . TEE WITHOUT CARDIOVERSION N/A 01/10/2018   Procedure: TRANSESOPHAGEAL ECHOCARDIOGRAM (TEE);  Surgeon: Grace Isaac, MD;  Location: [place];  Service: Open Heart Surgery;  Laterality: N/A;  . TONSILLECTOMY    . TUBAL LIGATION    . WRIST SURGERY Right     There were no vitals filed for this visit.  Subjective Assessment - 05/01/19 0804    Subjective  Pt reporting her head has been bothering her, making her kind of sluggish. Started on new BP med via virtual MD visit yesterday (not sure of name of med). Feels like taping has made a difference.    Pertinent History  CABG x 4 - 01/10/18    Patient Stated Goals  "to have the pressure in my chest relieved"    Currently in Pain?  Yes    Pain Score  5     Pain Location  Chest    Pain Orientation  Left;Anterior;Upper    Pain Descriptors / Indicators  Pressure    Pain Type  Chronic pain    Pain Frequency  Intermittent    Pain Score  5    Pain Location  Neck    Pain Orientation  Right;Left    Pain Descriptors / Indicators  Aching  Pain Type  Chronic pain    Pain Frequency  Constant                       [facility] Adult PT Treatment/Exercise - 05/01/19 0801      Neck Exercises: Theraband   Rows  --    Rows Limitations  --      Neck Exercises: Standing   Wall Push Ups  10 reps      Shoulder Exercises: Prone   Extension  Both;AROM;Strengthening;10 reps   break after 7 reps d/t cramping in rhomboids   Extension Limitations  I's - leaning over orange Pball propped on hi/low table    Horizontal ABduction 1 Limitations  T's - leaning over orange Pball propped on hi/low table      Shoulder Exercises: Standing   Row  Both;10 reps;Theraband;Strengthening    Theraband Level (Shoulder Row)  Level 2 (Red)    Row Limitations  cues to tuck elbows into ribs + scap retraction as well as cues to avoid shoulder shrug     Retraction  Both;10 reps;Theraband;Strengthening    Theraband Level (Shoulder Retraction)  Level 2 (Red)    Retraction Limitations  + mini-shoulder extension      Shoulder Exercises: ROM/Strengthening   UBE (Upper Arm Bike)  L2.0 x 6 min (3' fwd/3' back)      Shoulder Exercises: Stretch   Other Shoulder Stretches  B rhomboid stretch x 30 sec      Modalities   Modalities  Electrical Stimulation;Moist Heat      Moist Heat Therapy   Number Minutes Moist Heat  15 Minutes    Moist Heat Location  Cervical;Shoulder      Electrical Stimulation   Electrical Stimulation Location  B upper shoulders    Electrical Stimulation Action  IFC    Electrical Stimulation Parameters  80-150 Hz, intensity to pt tolerance x 15'    Electrical Stimulation Goals  Pain;Tone      Manual Therapy   Manual Therapy  Soft tissue mobilization;Myofascial release;Taping    Manual therapy comments  seated    Soft tissue mobilization  STM to B UT/LS    Myofascial Release  manual TPR to L>R  UT/LS    Kinesiotex  Inhibit Muscle      Kinesiotix   Inhibit Muscle   B UT & LS 30%; L pec minor - 2 strips ~30% from insertion on coracoid process with 1st strip extending medially toward sternum and 2nd strip more vertically along lateral pecs      Neck Exercises: Stretches   Upper Trapezius Stretch  Right;Left;30 seconds;1 rep    Levator Stretch  Right;Left;30 seconds;1 rep               PT Short Term Goals - 04/16/19 1413      PT SHORT TERM GOAL #1   Title  Patient will be independent with initial HEP    Status  Achieved   04/16/19     PT SHORT TERM GOAL #2   Title  Patient to demonstrate understanding of good posture    Status  Achieved   04/16/2019   Target Date  --      PT SHORT TERM GOAL #3   Title  Patient to report pain reduction in frequency and intensity by >/= 25%    Status  Partially Met   15% improvement noted   Target Date  04/16/19        PT Long  Term Goals - 04/10/19 0815      PT  LONG TERM GOAL #1   Title  Patient will be independent with ongoing/advanced HEP    Status  On-going    Target Date  05/28/19      PT LONG TERM GOAL #2   Title  Patient to demonstrate appropriate posture and body mechanics needed for daily activities    Status  On-going    Target Date  05/28/19      PT LONG TERM GOAL #3   Title  Patient to report pain reduction in frequency and intensity by >/= 50%    Status  On-going    Target Date  05/28/19      PT LONG TERM GOAL #4   Title  Patient to improve cervical and B shoulder AROM to WFL/WNL without pain provocation    Status  On-going    Target Date  05/28/19      PT LONG TERM GOAL #5   Title  Patient to report ability to sleep as well as perform ADLs and household tasks without pain interference    Status  On-going    Target Date  05/28/19            Plan - 05/01/19 0809    Clinical Impression Statement  Diana [name] reporting recent changes in her BP meds to address ongoing higher than usual BPs and notes MD may also be changing her anxiety meds to help reduce the feeling of stress related tension in her neck and shoulder region. She notes chest pain seems to be improving with neck and shoulder pain still more problematic, although notes continued benefit from taping. Increased muscle tension noted in B upper shoulders with TPs noted in L UT/LS which was addressed with manual STM/MFR and manual TPR following by PT guided stretching with MFR. Able to progress HEP rows and retraction to red TB but continued cues necessary to avoid shoulder elevation, focusing scapular retraction and depression. Treatment concluded with reapplication of kinesiotape as well as trial of IFC estim with patient noting benefit. Will plan to provide information on home TENS unit options next visit.    Personal Factors and Comorbidities  Comorbidity 3+;Time since onset of injury/illness/exacerbation;Past/Current Experience    Comorbidities  CAD s/p NSTEMI 10/30/16 &  CABG x 4 01/10/18; HTN; cervicalgia; carpal tunnel syndrome s/p CTR 02/12/16; GERD; bronchitis    Rehab Potential  Good    PT Frequency  2x / week    PT Duration  8 weeks    PT Treatment/Interventions  ADLs/Self Care Home Management;Cryotherapy;Electrical Stimulation;Iontophoresis 60m/ml Dexamethasone;Moist Heat;Ultrasound;Functional mobility training;Therapeutic activities;Therapeutic exercise;Neuromuscular re-education;Patient/family education;Manual techniques;Scar mobilization;Passive range of motion;Dry needling;Taping;Vasopneumatic Device;Spinal Manipulations;Joint Manipulations    PT Next Visit Plan  provide info on home TENS unit options; postural stretchging and strengthening; manual therapy including STM/MFR to for muscle spams/tightness; modalities PRN    PT Home Exercise Plan  04/02/19 - 3-way doorway pec stretch (high position deferred as of 04/10/19), reverse shoulder rolls, scap retraction, cervical retraction; 04/12/19 - UT, LS & SCM stretches, yellow (progressed to red as of 05/01/19) TB rows & retraction    Consulted and Agree with Plan of Care  Patient       Patient will benefit from skilled therapeutic intervention in order to improve the following deficits and impairments:  Cardiopulmonary status limiting activity, Decreased activity tolerance, Decreased mobility, Decreased range of motion, Decreased scar mobility, Decreased strength, Hypomobility, Increased fascial restricitons, Increased muscle spasms, Impaired perceived functional ability, Impaired  flexibility, Impaired UE functional use, Improper body mechanics, Postural dysfunction, Pain  Visit Diagnosis: Other symptoms and signs involving the musculoskeletal system  Cervicalgia  Abnormal posture  Muscle weakness (generalized)     Problem List Patient Active Problem List   Diagnosis Date Noted  . Atypical chest pain 10/24/2018  . S/P CABG x 4 01/10/2018  . Hyperlipidemia 01/09/2018  . Unstable angina ([place])  01/05/2018  . CAD (coronary artery disease), native coronary artery 12/28/2017  . Asymptomatic microscopic hematuria 11/14/2017  . Iron deficiency anemia 09/05/2017  . Cervicalgia 12/18/2015  . Benign essential HTN 04/08/2015  . Metatarsalgia of right foot 02/11/2015    Percival Spanish, PT, MPT 05/01/2019, 11:56 AM  [facility] [address]  [place], [place], [address] Phone: [phone]   Fax:  [phone]  Name: Diana [name] MRN: [id number] Date of Birth: Aug 09, 1955

## 2019-05-04 ENCOUNTER — Other Ambulatory Visit: Payer: Self-pay

## 2019-05-04 ENCOUNTER — Ambulatory Visit: Payer: Medicaid Other | Admitting: Physical Therapy

## 2019-05-04 VITALS — BP 148/88 | HR 60

## 2019-05-04 DIAGNOSIS — M6281 Muscle weakness (generalized): Secondary | ICD-10-CM

## 2019-05-04 DIAGNOSIS — M542 Cervicalgia: Secondary | ICD-10-CM | POA: Diagnosis not present

## 2019-05-04 DIAGNOSIS — R293 Abnormal posture: Secondary | ICD-10-CM

## 2019-05-04 DIAGNOSIS — R29898 Other symptoms and signs involving the musculoskeletal system: Secondary | ICD-10-CM

## 2019-05-04 NOTE — Patient Instructions (Signed)
TENS UNIT  This is helpful for muscle pain and spasm.   Search and Purchase a TENS 7000 2nd edition at www.tenspros.com or www.amazon.com  (It should be less than $30)     TENS unit instructions:   Do not shower or bathe with the unit on  Turn the unit off before removing electrodes or batteries  If the electrodes lose stickiness add a drop of water to the electrodes after they are disconnected from the unit and place on plastic sheet. If you continued to have difficulty, call the TENS unit company to purchase more electrodes.  Do not apply lotion on the skin area prior to use. Make sure the skin is clean and dry as this will help prolong the life of the electrodes.  After use, always check skin for unusual red areas, rash or other skin difficulties. If there are any skin problems, does not apply electrodes to the same area.  Never remove the electrodes from the unit by pulling the wires.  Do not use the TENS unit or electrodes other than as directed.  Do not change electrode placement without consulting your therapist or physician.  Keep 2 fingers with between each electrode.   TENS stands for Transcutaneous Electrical Nerve Stimulation. In other words, electrical impulses are allowed to pass through the skin in order to excite a nerve.   Purpose and Use of TENS:  TENS is a method used to manage acute and chronic pain without the use of drugs. It has been effective in managing pain associated with surgery, sprains, strains, trauma, rheumatoid arthritis, and neuralgias. It is a non-addictive, low risk, and non-invasive technique used to control pain. It is not, by any means, a curative form of treatment.   How TENS Works:  Most TENS units are a Paramedic unit powered by one 9 volt battery. Attached to the outside of the unit are two lead wires where two pins and/or snaps connect on each wire. All units come with a set of four reusable pads or electrodes. These are placed  on the skin surrounding the area involved. By inserting the leads into  the pads, the electricity can pass from the unit making the circuit complete.  As the intensity is turned up slowly, the electrical current enters the body from the electrodes through the skin to the surrounding nerve fibers. This triggers the release of hormones from within the body. These hormones contain pain relievers. By increasing the circulation of these hormones, the person's pain may be lessened. It is also believed that the electrical stimulation itself helps to block the pain messages being sent to the brain, thus also decreasing the body's perception of pain.   Hazards:  TENS units are NOT to be used by patients with PACEMAKERS, DEFIBRILLATORS, DIABETIC PUMPS, PREGNANT WOMEN, and patients with SEIZURE DISORDERS.  TENS units are NOT to be used over the heart, throat, brain, or spinal cord.  One of the major side effects from the TENS unit may be skin irritation. Some people may develop a rash if they are sensitive to the materials used in the electrodes or the connecting wires.   Wear the unit for up to 30 minutes at a time, 3-4x/day..   Avoid overuse due the body getting used to the stem making it not as effective over time.

## 2019-05-04 NOTE — Therapy (Signed)
[facility] [address]  [place], [place], [address] Phone: [phone]   Fax:  [phone]  Physical Therapy Treatment  Patient Details  Name: Diana [name] MRN: [id number] Date of Birth: 1955-10-25 Referring Provider (PT): Jenne Campus, MD   Encounter Date: 05/04/2019  PT End of Session - 05/04/19 0800    Visit Number  7    Number of Visits  16    Date for PT Re-Evaluation  05/28/19    Authorization Type  Medicaid    Authorization Time Period  04/20/19 - 05/31/19    Authorization - Visit Number  3    Authorization - Number of Visits  12    PT Start Time  0800    PT Stop Time  0907    PT Time Calculation (min)  67 min    Activity Tolerance  Patient tolerated treatment well    Behavior During Therapy  [facility] for tasks assessed/performed       Past Medical History:  Diagnosis Date  . Allergy   . Arthritis    feet  . Bronchitis 10/30/2016  . Carpal tunnel syndrome   . Coronary artery disease   . GERD (gastroesophageal reflux disease)   . Hyperlipidemia   . Hypertension   . Non-ST elevation (NSTEMI) myocardial infarction ([place]) 10/30/2016  . NSTEMI (non-ST elevated myocardial infarction) ([place]) 10/30/2016    Past Surgical History:  Procedure Laterality Date  . CARPAL TUNNEL RELEASE Left 02/12/2016   Procedure: LEFT CARPAL TUNNEL RELEASE;  Surgeon: Daryll Brod, MD;  Location: [place];  Service: Orthopedics;  Laterality: Left;  FAB  . CORONARY ARTERY BYPASS GRAFT N/A 01/10/2018   Procedure: CORONARY ARTERY BYPASS GRAFTING (CABG) TIMES 4, USING LEFT INTERNAL MAMMARY ARTERY TO OM1  AND ENDOSCOPICALLY HARVESTED RIGHT SAPHENOUS VEIN TO DIAGONAL, AND SEQUENTIALLY TO OM2 AND DISTAL CIRCUMFLEX;  Surgeon: Grace Isaac, MD;  Location: [facility];  Service: Open Heart Surgery;  Laterality: N/A;  . LEFT HEART CATH AND CORONARY ANGIOGRAPHY N/A 01/05/2018   Procedure: LEFT HEART CATH AND CORONARY ANGIOGRAPHY;   Surgeon: Troy Sine, MD;  Location: [facility];  Service: Cardiovascular;  Laterality: N/A;  . TEE WITHOUT CARDIOVERSION N/A 01/10/2018   Procedure: TRANSESOPHAGEAL ECHOCARDIOGRAM (TEE);  Surgeon: Grace Isaac, MD;  Location: [facility];  Service: Open Heart Surgery;  Laterality: N/A;  . TONSILLECTOMY    . TUBAL LIGATION    . WRIST SURGERY Right     Vitals:   05/04/19 0808 05/04/19 0829  BP: (!) 162/90 (!) 148/88  Pulse: 63 60  SpO2: 96% 99%    Subjective Assessment - 05/04/19 0807    Subjective  During warm-up pt requesting to have her BP checked (recommended byt her doctor) - BP elevated, therefore warm-up discontinued. Pt noting benefit from estim last visit and inquiring about home options.    Pertinent History  CABG x 4 - 01/10/18    Patient Stated Goals  "to have the pressure in my chest relieved"    Currently in Pain?  Yes    Pain Score  3     Pain Location  Chest    Pain Orientation  Left;Upper;Anterior    Pain Descriptors / Indicators  --   "pinching"   Pain Score  6    Pain Location  Neck    Pain Orientation  Right;Left   R>L   Pain Descriptors / Indicators  Pounding    Pain  Type  Chronic pain    Pain Frequency  Constant                       [facility] Adult PT Treatment/Exercise - 05/04/19 0800      Exercises   Exercises  Neck      Shoulder Exercises: ROM/Strengthening   UBE (Upper Arm Bike)  L2.0 x 2 min (fwd only)      Modalities   Modalities  Electrical Stimulation;Moist Heat      Moist Heat Therapy   Number Minutes Moist Heat  15 Minutes    Moist Heat Location  Cervical;Shoulder      Electrical Stimulation   Electrical Stimulation Location  B upper shoulders    Electrical Stimulation Action  IFC    Electrical Stimulation Parameters  80-150 Hz, intensity to pt tolerance x 15'    Electrical Stimulation Goals  Pain;Tone      Manual Therapy   Manual Therapy  Soft tissue mobilization;Myofascial release;Taping    Soft tissue  mobilization  STM to B lower cervical paraspinals and UT/LS    Myofascial Release  manual TPR to R>L UT/LS    Kinesiotex  Inhibit Muscle      Kinesiotix   Inhibit Muscle   B UT & LS 30%; L pec minor - 2 strips ~30% from insertion on coracoid process with 1st strip extending medially toward sternum and 2nd strip more vertically along lateral pecs               PT Short Term Goals - 04/16/19 1413      PT SHORT TERM GOAL #1   Title  Patient will be independent with initial HEP    Status  Achieved   04/16/19     PT SHORT TERM GOAL #2   Title  Patient to demonstrate understanding of good posture    Status  Achieved   04/16/2019   Target Date  --      PT SHORT TERM GOAL #3   Title  Patient to report pain reduction in frequency and intensity by >/= 25%    Status  Partially Met   15% improvement noted   Target Date  04/16/19        PT Long Term Goals - 04/10/19 0815      PT LONG TERM GOAL #1   Title  Patient will be independent with ongoing/advanced HEP    Status  On-going    Target Date  05/28/19      PT LONG TERM GOAL #2   Title  Patient to demonstrate appropriate posture and body mechanics needed for daily activities    Status  On-going    Target Date  05/28/19      PT LONG TERM GOAL #3   Title  Patient to report pain reduction in frequency and intensity by >/= 50%    Status  On-going    Target Date  05/28/19      PT LONG TERM GOAL #4   Title  Patient to improve cervical and B shoulder AROM to WFL/WNL without pain provocation    Status  On-going    Target Date  05/28/19      PT LONG TERM GOAL #5   Title  Patient to report ability to sleep as well as perform ADLs and household tasks without pain interference    Status  On-going    Target Date  05/28/19  Plan - 05/04/19 0907    Clinical Impression Statement  Olin Hauser requesting PT check her BP during warm-up with BP found to be elevated at 162/90, therefore warm-up discontinued. She noted  benefit from estim last visit and expressed interest in home TENS unit, therefore provided education on options for home units as well as resources on where she could purchase a unit and supplies. Clinic demo model of TENS 7000 2nd edition shown to patient as example. BP down to 148/88 following rest during TENS education, but patient reporting increased pain in R UT/LS today, therefore remainder of session focused on manual therapy to address ongoing increased muscle tension and TPs followed by reapplication of kinesiotape (pt still noting benefit) and conclusion of session with estim and moist heat to further promote reduction of pain and muscle tension.    Personal Factors and Comorbidities  Comorbidity 3+;Time since onset of injury/illness/exacerbation;Past/Current Experience    Comorbidities  CAD s/p NSTEMI 10/30/16 & CABG x 4 01/10/18; HTN; cervicalgia; carpal tunnel syndrome s/p CTR 02/12/16; GERD; bronchitis    Rehab Potential  Good    PT Frequency  2x / week    PT Duration  8 weeks    PT Treatment/Interventions  ADLs/Self Care Home Management;Cryotherapy;Electrical Stimulation;Iontophoresis 41m/ml Dexamethasone;Moist Heat;Ultrasound;Functional mobility training;Therapeutic activities;Therapeutic exercise;Neuromuscular re-education;Patient/family education;Manual techniques;Scar mobilization;Passive range of motion;Dry needling;Taping;Vasopneumatic Device;Spinal Manipulations;Joint Manipulations    PT Next Visit Plan  postural stretchging and strengthening; manual therapy including STM/MFR to for muscle spams/tightness; modalities PRN    PT Home Exercise Plan  04/02/19 - 3-way doorway pec stretch (high position deferred as of 04/10/19), reverse shoulder rolls, scap retraction, cervical retraction; 04/12/19 - UT, LS & SCM stretches, yellow (progressed to red as of 05/01/19) TB rows & retraction    Consulted and Agree with Plan of Care  Patient       Patient will benefit from skilled therapeutic  intervention in order to improve the following deficits and impairments:  Cardiopulmonary status limiting activity, Decreased activity tolerance, Decreased mobility, Decreased range of motion, Decreased scar mobility, Decreased strength, Hypomobility, Increased fascial restricitons, Increased muscle spasms, Impaired perceived functional ability, Impaired flexibility, Impaired UE functional use, Improper body mechanics, Postural dysfunction, Pain  Visit Diagnosis: Other symptoms and signs involving the musculoskeletal system  Cervicalgia  Abnormal posture  Muscle weakness (generalized)     Problem List Patient Active Problem List   Diagnosis Date Noted  . Atypical chest pain 10/24/2018  . S/P CABG x 4 01/10/2018  . Hyperlipidemia 01/09/2018  . [place] 01/05/2018  . CAD (coronary artery disease), native coronary artery 12/28/2017  . Asymptomatic microscopic hematuria 11/14/2017  . Iron deficiency anemia 09/05/2017  . Cervicalgia 12/18/2015  . Benign essential HTN 04/08/2015  . Metatarsalgia of right foot 02/11/2015    JPercival Spanish PT, MPT 05/04/2019, 2:04 PM  [facility] [place] [address]Phone: [phone]  Fax:  [phone] Name: PCASSANDRE OLEKSYMRN: [id number] of Birth: 820-Jul-1957

## 2019-05-07 DIAGNOSIS — G4700 Insomnia, unspecified: Secondary | ICD-10-CM | POA: Diagnosis not present

## 2019-05-07 DIAGNOSIS — M542 Cervicalgia: Secondary | ICD-10-CM | POA: Diagnosis not present

## 2019-05-07 DIAGNOSIS — I10 Essential (primary) hypertension: Secondary | ICD-10-CM | POA: Diagnosis not present

## 2019-05-08 ENCOUNTER — Ambulatory Visit: Payer: Medicaid Other | Admitting: Physical Therapy

## 2019-05-08 ENCOUNTER — Other Ambulatory Visit: Payer: Self-pay

## 2019-05-08 ENCOUNTER — Encounter: Payer: Self-pay | Admitting: Physical Therapy

## 2019-05-08 VITALS — BP 128/78 | HR 68

## 2019-05-08 DIAGNOSIS — M6281 Muscle weakness (generalized): Secondary | ICD-10-CM | POA: Diagnosis not present

## 2019-05-08 DIAGNOSIS — R293 Abnormal posture: Secondary | ICD-10-CM

## 2019-05-08 DIAGNOSIS — M542 Cervicalgia: Secondary | ICD-10-CM | POA: Diagnosis not present

## 2019-05-08 DIAGNOSIS — R29898 Other symptoms and signs involving the musculoskeletal system: Secondary | ICD-10-CM

## 2019-05-08 NOTE — Therapy (Signed)
[facility] [address]  [place], [place], [address] Phone: [phone]   Fax:  [phone]  Physical Therapy Treatment  Patient Details  Name: Diana [name] MRN: [id number] Date of Birth: [birth year] Referring Provider (PT): Jenne Campus, MD   Encounter Date: 05/08/2019  PT End of Session - 05/08/19 0801    Visit Number  8    Number of Visits  16    Date for PT Re-Evaluation  05/28/19    Authorization Type  Medicaid    Authorization Time Period  04/20/19 - 05/31/19    Authorization - Visit Number  4    Authorization - Number of Visits  12    PT Start Time  0801    PT Stop Time  0906    PT Time Calculation (min)  65 min    Activity Tolerance  Patient tolerated treatment well    Behavior During Therapy  [facility] for tasks assessed/performed       Past Medical History:  Diagnosis Date  . Allergy   . Arthritis    feet  . Bronchitis 10/30/2016  . Carpal tunnel syndrome   . Coronary artery disease   . GERD (gastroesophageal reflux disease)   . Hyperlipidemia   . Hypertension   . Non-ST elevation (NSTEMI) myocardial infarction ([place]) 10/30/2016  . NSTEMI (non-ST elevated myocardial infarction) ([place]) 10/30/2016    Past Surgical History:  Procedure Laterality Date  . CARPAL TUNNEL RELEASE Left 02/12/2016   Procedure: LEFT CARPAL TUNNEL RELEASE;  Surgeon: Daryll Brod, MD;  Location: [facility];  Service: Orthopedics;  Laterality: Left;  FAB  . CORONARY ARTERY BYPASS GRAFT N/A 01/10/2018   Procedure: CORONARY ARTERY BYPASS GRAFTING (CABG) TIMES 4, USING LEFT INTERNAL MAMMARY ARTERY TO OM1  AND ENDOSCOPICALLY HARVESTED RIGHT SAPHENOUS VEIN TO DIAGONAL, AND SEQUENTIALLY TO OM2 AND DISTAL CIRCUMFLEX;  Surgeon: Grace Isaac, MD;  Location: [facility];  Service: Open Heart Surgery;  Laterality: N/A;  . LEFT HEART CATH AND CORONARY ANGIOGRAPHY N/A 01/05/2018   Procedure: LEFT HEART CATH AND CORONARY ANGIOGRAPHY;   Surgeon: Troy Sine, MD;  Location: [facility];  Service: Cardiovascular;  Laterality: N/A;  . TEE WITHOUT CARDIOVERSION N/A 01/10/2018   Procedure: TRANSESOPHAGEAL ECHOCARDIOGRAM (TEE);  Surgeon: Grace Isaac, MD;  Location: [facility];  Service: Open Heart Surgery;  Laterality: N/A;  . TONSILLECTOMY    . TUBAL LIGATION    . WRIST SURGERY Right     Vitals:   05/08/19 0805 05/08/19 0813  BP: 122/80 128/78  Pulse: 63 68  SpO2: 96% 95%    Subjective Assessment - 05/08/19 0805    Subjective  Pt obtained a home TENS unit (TENS 7000 - 2nd edition) and brought it with her for training today.    Pertinent History  CABG x 4 - 01/10/18    Patient Stated Goals  "to have the pressure in my chest relieved"    Currently in Pain?  Yes    Pain Score  2     Pain Location  Chest    Pain Orientation  Left;Anterior;Medial    Pain Descriptors / Indicators  --   "pulling"   Pain Type  Chronic pain    Pain Frequency  Intermittent    Pain Score  5    Pain Location  Neck   & B shoulders   Pain Orientation  Right;Left    Pain Descriptors / Indicators  Cramping  Pain Frequency  Intermittent                       [facility] Adult PT Treatment/Exercise - 05/08/19 0801      Self-Care   Self-Care  Other Self-Care Comments    Other Self-Care Comments   Provided instruction in set-up and use of home TENS unit including mode/parameter and intensity adjustment, electrode placement, removal and storage, and recommended treatment times.      Neck Exercises: Theraband   Horizontal ABduction  10 reps;Red    Horizontal ABduction Limitations  seated - cues for scap retraction      Shoulder Exercises: ROM/Strengthening   UBE (Upper Arm Bike)  L2.5 x 6 min (3' fwd/3' back)      Shoulder Exercises: IT sales professional  30 seconds;2 reps   Advertising copywriter Limitations  3-way doorway pec stretch - cues for proper alignment and increased hold time      Hand Exercises for  Cervical Radiculopathy   Other Hand Exercise for Cervical Radiculopathy  R UE brachial plexus nerve glide x 10      Modalities   Modalities  Electrical Stimulation;Moist Heat      Moist Heat Therapy   Number Minutes Moist Heat  15 Minutes    Moist Heat Location  Cervical;Shoulder      Electrical Stimulation   Electrical Stimulation Location  L anterior upper chest/pecs; R lateral neck & upper shoulder    Electrical Stimulation Action  TENS   TENS 7000 - 2nd edition   Electrical Stimulation Parameters  SD1 mode - default frequency & pulse width, intensity to pt tolerance x 15 min    Electrical Stimulation Goals  Pain;Tone             PT Education - 05/08/19 0906    Education Details  HEP update - brachial plexus nerve glide; Instruction in set-up & usage of home TENS 7000 2nd edition    Person(s) Educated  Patient    Methods  Explanation;Demonstration;Handout    Comprehension  Verbalized understanding;Returned demonstration;Need further instruction       PT Short Term Goals - 05/08/19 0823      PT SHORT TERM GOAL #1   Title  Patient will be independent with initial HEP    Status  Achieved   04/16/19     PT SHORT TERM GOAL #2   Title  Patient to demonstrate understanding of good posture    Status  Achieved   04/16/2019     PT SHORT TERM GOAL #3   Title  Patient to report pain reduction in frequency and intensity by >/= 25%    Status  Achieved   30% improved as of 05/08/19   Target Date  04/16/19        PT Long Term Goals - 05/08/19 0825      PT LONG TERM GOAL #1   Title  Patient will be independent with ongoing/advanced HEP    Status  Partially Met      PT LONG TERM GOAL #2   Title  Patient to demonstrate appropriate posture and body mechanics needed for daily activities    Status  Partially Met      PT LONG TERM GOAL #3   Title  Patient to report pain reduction in frequency and intensity by >/= 50%    Status  On-going      PT LONG TERM GOAL #4    Title  Patient to improve cervical and B shoulder AROM to WFL/WNL without pain provocation    Status  On-going      PT LONG TERM GOAL #5   Title  Patient to report ability to sleep as well as perform ADLs and household tasks without pain interference    Status  On-going            Plan - 05/08/19 0826    Clinical Impression Statement  Diana [name] reporting 30% improvement in L chest and B neck/upper shoulder pain thus far with PT but still notes sleep interference due to pain - all STGs now met. L anterior chest pain now more localized to medial breast with pulling sensation noted. Reviewed 3-way doorway stretch with patient reporting better isolation of stretch at desired area with mid and upper stretch positions after review. Patient also reporting intermittent cramping sensation in neck/upper shoulders with intermittent numbness into R UE and hand, although patient unable to localize particular pattern to numbness. Provided instruction in R UE brachial plexus nerve glide and requested patient pay attention to pattern of numbness if it were to reoccur to allow for more targeted nerve glide as indicated. Treatment concluded with instruction in set-up and usage of home TENS 7000 2nd edition unit at patient request with patient verbalizing understanding.    Personal Factors and Comorbidities  Comorbidity 3+;Time since onset of injury/illness/exacerbation;Past/Current Experience    Comorbidities  CAD s/p NSTEMI 10/30/16 & CABG x 4 01/10/18; HTN; cervicalgia; carpal tunnel syndrome s/p CTR 02/12/16; GERD; bronchitis    Rehab Potential  Good    PT Frequency  2x / week    PT Duration  8 weeks    PT Treatment/Interventions  ADLs/Self Care Home Management;Cryotherapy;Electrical Stimulation;Iontophoresis 41m/ml Dexamethasone;Moist Heat;Ultrasound;Functional mobility training;Therapeutic activities;Therapeutic exercise;Neuromuscular re-education;Patient/family education;Manual techniques;Scar mobilization;Passive  range of motion;Dry needling;Taping;Vasopneumatic Device;Spinal Manipulations;Joint Manipulations    PT Next Visit Plan  VS check PRN; address any question/concerns re: home TENS unit; postural stretching and strengthening; manual therapy including STM/MFR to for muscle spams/tightness; modalities PRN    PT Home Exercise Plan  04/02/19 - 3-way doorway pec stretch (high position deferred as of 04/10/19), reverse shoulder rolls, scap retraction, cervical retraction; 04/12/19 - UT, LS & SCM stretches, yellow (progressed to red as of 05/01/19) TB rows & retraction; 05/08/19 - UE brachial plexus nerve glide    Consulted and Agree with Plan of Care  Patient       Patient will benefit from skilled therapeutic intervention in order to improve the following deficits and impairments:  Cardiopulmonary status limiting activity, Decreased activity tolerance, Decreased mobility, Decreased range of motion, Decreased scar mobility, Decreased strength, Hypomobility, Increased fascial restricitons, Increased muscle spasms, Impaired perceived functional ability, Impaired flexibility, Impaired UE functional use, Improper body mechanics, Postural dysfunction, Pain  Visit Diagnosis: Other symptoms and signs involving the musculoskeletal system  Cervicalgia  Abnormal posture  Muscle weakness (generalized)     Problem List Patient Active Problem List   Diagnosis Date Noted  . Atypical chest pain 10/24/2018  . S/P CABG x 4 01/10/2018  . Hyperlipidemia 01/09/2018  . Unstable angina ([place] 01/05/2018  . CAD (coronary artery disease), native coronary artery 12/28/2017  . Asymptomatic microscopic hematuria 11/14/2017  . Iron deficiency anemia 09/05/2017  . Cervicalgia 12/18/2015  . Benign essential HTN 04/08/2015  . Metatarsalgia of right foot 02/11/2015    JPercival Spanish PT, MPT 05/08/2019, 10:17 AM  [facility] [address] [place]  [place],  [place], [address] Phone: [phone]   Fax:  [phone]  Name: Diana [name] MRN: [id number] Date of Birth: 26-May-1956

## 2019-05-11 ENCOUNTER — Ambulatory Visit: Payer: Medicaid Other | Admitting: Physical Therapy

## 2019-05-11 ENCOUNTER — Encounter: Payer: Self-pay | Admitting: Physical Therapy

## 2019-05-11 ENCOUNTER — Other Ambulatory Visit: Payer: Self-pay

## 2019-05-11 VITALS — BP 120/80 | HR 75

## 2019-05-11 DIAGNOSIS — M6281 Muscle weakness (generalized): Secondary | ICD-10-CM

## 2019-05-11 DIAGNOSIS — M542 Cervicalgia: Secondary | ICD-10-CM

## 2019-05-11 DIAGNOSIS — R293 Abnormal posture: Secondary | ICD-10-CM

## 2019-05-11 DIAGNOSIS — R29898 Other symptoms and signs involving the musculoskeletal system: Secondary | ICD-10-CM | POA: Diagnosis not present

## 2019-05-11 NOTE — Patient Instructions (Signed)

## 2019-05-11 NOTE — Therapy (Signed)
[facility] [address]  [place], [place], [address] Phone: [phone]   Fax:  [phone]  Physical Therapy Treatment  Patient Details  Name: Diana [name] MRN: [id number] Date of Birth: 17-Feb-1956 Referring Provider (PT): Diana Campus, MD   Encounter Date: 05/11/2019  PT End of Session - 05/11/19 0803    Visit Number  9    Number of Visits  16    Date for PT Re-Evaluation  05/28/19    Authorization Type  Medicaid    Authorization Time Period  04/20/19 - 05/31/19    Authorization - Visit Number  5    Authorization - Number of Visits  12    PT Start Time  0803    PT Stop Time  0910    PT Time Calculation (min)  67 min    Activity Tolerance  Patient tolerated treatment well    Behavior During Therapy  Diana [name] for tasks assessed/performed       Past Medical History:  Diagnosis Date  . Allergy   . Arthritis    feet  . Bronchitis 10/30/2016  . Carpal tunnel syndrome   . Coronary artery disease   . GERD (gastroesophageal reflux disease)   . Hyperlipidemia   . Hypertension   . Non-ST elevation (NSTEMI) myocardial infarction ([place]) 10/30/2016  . NSTEMI (non-ST elevated myocardial infarction) ([place]) 10/30/2016    Past Surgical History:  Procedure Laterality Date  . CARPAL TUNNEL RELEASE Left 02/12/2016   Procedure: LEFT CARPAL TUNNEL RELEASE;  Surgeon: Daryll Brod, MD;  Location: [facility];  Service: Orthopedics;  Laterality: Left;  FAB  . CORONARY ARTERY BYPASS GRAFT N/A 01/10/2018   Procedure: CORONARY ARTERY BYPASS GRAFTING (CABG) TIMES 4, USING LEFT INTERNAL MAMMARY ARTERY TO OM1  AND ENDOSCOPICALLY HARVESTED RIGHT SAPHENOUS VEIN TO DIAGONAL, AND SEQUENTIALLY TO OM2 AND DISTAL CIRCUMFLEX;  Surgeon: Grace Isaac, MD;  Location: [facility];  Service: Open Heart Surgery;  Laterality: N/A;  . LEFT HEART CATH AND CORONARY ANGIOGRAPHY N/A 01/05/2018   Procedure: LEFT HEART CATH AND CORONARY ANGIOGRAPHY;   Surgeon: Troy Sine, MD;  Location: [facility];  Service: Cardiovascular;  Laterality: N/A;  . TEE WITHOUT CARDIOVERSION N/A 01/10/2018   Procedure: TRANSESOPHAGEAL ECHOCARDIOGRAM (TEE);  Surgeon: Grace Isaac, MD;  Location: [facility];  Service: Open Heart Surgery;  Laterality: N/A;  . TONSILLECTOMY    . TUBAL LIGATION    . WRIST SURGERY Right     Vitals:   05/11/19 0805  BP: 120/80  Pulse: 75  SpO2: 98%    Subjective Assessment - 05/11/19 0810    Subjective  Pt reporting she can feel the difference having not had the tape on the past week and requesting reapplication today. Also reports she was working in her Diana [name] area yesterday and feels like she may have pulled something.    Pertinent History  CABG x 4 - 01/10/18    Patient Stated Goals  "to have the pressure in my chest relieved"    Currently in Pain?  Yes    Pain Score  6     Pain Location  Chest    Pain Orientation  Left;Anterior;Medial    Pain Descriptors / Indicators  Pressure   & "pulling"   Pain Type  Chronic pain    Pain Frequency  Intermittent    Pain Score  6    Pain Location  Neck   & upper back  Pain Orientation  Right;Left    Pain Descriptors / Indicators  Pressure   & "pulling"   Pain Type  Chronic pain    Pain Frequency  Intermittent                       [facility] Adult PT Treatment/Exercise - 05/11/19 0803      Shoulder Exercises: ROM/Strengthening   UBE (Upper Arm Bike)  L2.5 x 6 min (3' fwd/3' back)      Modalities   Modalities  Electrical Stimulation;Moist Heat      Moist Heat Therapy   Moist Heat Location  Cervical;Shoulder      Electrical Stimulation   Electrical Stimulation Location  B upper shoulders    Electrical Stimulation Action  IFC    Electrical Stimulation Parameters  80-150 Hz, intensity to pt tolerance x 15'    Electrical Stimulation Goals  Pain;Tone      Manual Therapy   Manual Therapy  Soft tissue mobilization;Myofascial release;Taping    Soft  tissue mobilization  STM to B lower cervical paraspinals and UT/LS    Myofascial Release  manual TPR to L>R UT/LS    Kinesiotex  Inhibit Muscle      Kinesiotix   Inhibit Muscle   B UT & LS 30%; L pec minor - 2 strips ~30% from insertion on coracoid process with 1st strip extending medially toward sternum and 2nd strip more vertically along lateral pecs       Trigger Point Dry Needling - 05/11/19 0803    Consent Given?  Yes    Education Handout Provided  Yes    Muscles Treated Head and Neck  Upper trapezius;Levator scapulae    Upper Trapezius Response  Twitch reponse elicited;Palpable increased muscle length   Bilateral   Levator Scapulae Response  Twitch response elicited;Palpable increased muscle length   Left            PT Short Term Goals - 05/11/19 0810      PT SHORT TERM GOAL #1   Title  Patient will be independent with initial HEP    Status  Achieved   04/16/19     PT SHORT TERM GOAL #2   Title  Patient to demonstrate understanding of good posture    Status  Achieved   04/16/2019     PT SHORT TERM GOAL #3   Title  Patient to report pain reduction in frequency and intensity by >/= 25%    Status  Achieved   30% improved as of 05/08/19       PT Long Term Goals - 05/08/19 0825      PT LONG TERM GOAL #1   Title  Patient will be independent with ongoing/advanced HEP    Status  Partially Met      PT LONG TERM GOAL #2   Title  Patient to demonstrate appropriate posture and body mechanics needed for daily activities    Status  Partially Met      PT LONG TERM GOAL #3   Title  Patient to report pain reduction in frequency and intensity by >/= 50%    Status  On-going      PT LONG TERM GOAL #4   Title  Patient to improve cervical and B shoulder AROM to WFL/WNL without pain provocation    Status  On-going      PT LONG TERM GOAL #5   Title  Patient to report ability to sleep as well as perform  ADLs and household tasks without pain interference    Status   On-going            Plan - 05/11/19 0814    Clinical Impression Statement  Diana [name] reporting increased pain after spending yesterday working to empty out her storage unit, with repetitive lifting and reaching into tight spaces. Increased muscle tension noted throughout lower cervical paraspinals and bilateral UT and LS, L>R today. Given ongoing tightness and pain despite stretching, exercises and manual therapy, suggested trial of DN to address pain and increased muscle tension. Patient reporting mild apprehension with needles but interested in giving it a try, therefore treatment session incorporating DN with informed patient consent into manual STM and MFR - good twitch responses elicited with palpable reduction in muscle tension. Treatment concluded with estim and moist heat to promote further muscle relaxation and pain relief with patient requesting reapplication of kinesiotape today noting increased pain with absence of tape application last visit.    Personal Factors and Comorbidities  Comorbidity 3+;Time since onset of injury/illness/exacerbation;Past/Current Experience    Comorbidities  CAD s/p NSTEMI 10/30/16 & CABG x 4 01/10/18; HTN; cervicalgia; carpal tunnel syndrome s/p CTR 02/12/16; GERD; bronchitis    Rehab Potential  Good    PT Frequency  2x / week    PT Duration  8 weeks    PT Treatment/Interventions  ADLs/Self Care Home Management;Cryotherapy;Electrical Stimulation;Iontophoresis 74m/ml Dexamethasone;Moist Heat;Ultrasound;Functional mobility training;Therapeutic activities;Therapeutic exercise;Neuromuscular re-education;Patient/family education;Manual techniques;Scar mobilization;Passive range of motion;Dry needling;Taping;Vasopneumatic Device;Spinal Manipulations;Joint Manipulations    PT Next Visit Plan  VS check PRN; address response to DN; postural stretching and strengthening; manual therapy including STM/MFR to for muscle spams/tightness; modalities PRN    PT Home Exercise Plan   04/02/19 - 3-way doorway pec stretch (high position deferred as of 04/10/19), reverse shoulder rolls, scap retraction, cervical retraction; 04/12/19 - UT, LS & SCM stretches, yellow (progressed to red as of 05/01/19) TB rows & retraction; 05/08/19 - UE brachial plexus nerve glide    Consulted and Agree with Plan of Care  Patient       Patient will benefit from skilled therapeutic intervention in order to improve the following deficits and impairments:  Cardiopulmonary status limiting activity, Decreased activity tolerance, Decreased mobility, Decreased range of motion, Decreased scar mobility, Decreased strength, Hypomobility, Increased fascial restricitons, Increased muscle spasms, Impaired perceived functional ability, Impaired flexibility, Impaired UE functional use, Improper body mechanics, Postural dysfunction, Pain  Visit Diagnosis: Other symptoms and signs involving the musculoskeletal system  Cervicalgia  Abnormal posture  Muscle weakness (generalized)     Problem List Patient Active Problem List   Diagnosis Date Noted  . Atypical chest pain 10/24/2018  . S/P CABG x 4 01/10/2018  . Hyperlipidemia 01/09/2018  . Unstable angina ([place] 01/05/2018  . CAD (coronary artery disease), native coronary artery 12/28/2017  . Asymptomatic microscopic hematuria 11/14/2017  . Iron deficiency anemia 09/05/2017  . Cervicalgia 12/18/2015  . Benign essential HTN 04/08/2015  . Metatarsalgia of right foot 02/11/2015    JPercival Spanish PT, MPT 05/11/2019, 12:00 PM  [facility] [place] [place] [address]Phone: [phone]  Fax:  [phone] Name: Diana ZAWISTOWSKIMRN: [id number] of Birth: [birth year]

## 2019-05-15 ENCOUNTER — Ambulatory Visit: Payer: Medicaid Other | Admitting: Physical Therapy

## 2019-05-18 ENCOUNTER — Ambulatory Visit: Payer: Medicaid Other | Admitting: Physical Therapy

## 2019-05-21 ENCOUNTER — Encounter: Payer: Medicaid Other | Admitting: Physical Therapy

## 2019-05-22 ENCOUNTER — Other Ambulatory Visit: Payer: Self-pay

## 2019-05-22 ENCOUNTER — Ambulatory Visit: Payer: Medicaid Other | Admitting: Physical Therapy

## 2019-05-22 ENCOUNTER — Encounter: Payer: Self-pay | Admitting: Physical Therapy

## 2019-05-22 DIAGNOSIS — R293 Abnormal posture: Secondary | ICD-10-CM

## 2019-05-22 DIAGNOSIS — R29898 Other symptoms and signs involving the musculoskeletal system: Secondary | ICD-10-CM

## 2019-05-22 DIAGNOSIS — M6281 Muscle weakness (generalized): Secondary | ICD-10-CM | POA: Diagnosis not present

## 2019-05-22 DIAGNOSIS — M542 Cervicalgia: Secondary | ICD-10-CM | POA: Diagnosis not present

## 2019-05-22 NOTE — Therapy (Signed)
[facility] [address]  [place], [place], [address] Phone: [phone]   Fax:  [phone]  Physical Therapy Treatment / Progress Note  Patient Details  Name: Diana [name] MRN: [id number] Date of Birth: 1956-04-15 Referring Provider (PT): Jenne Campus, MD  Progress Note  Reporting Period 04/02/2019 to 05/22/2019  See note below for Objective Data and Assessment of Progress/Goals.     Encounter Date: 05/22/2019  PT End of Session - 05/22/19 0802    Visit Number  10    Number of Visits  16    Date for PT Re-Evaluation  05/28/19    Authorization Type  Medicaid    Authorization Time Period  04/20/19 - 05/31/19    Authorization - Visit Number  6    Authorization - Number of Visits  12    PT Start Time  0802    PT Stop Time  0853    PT Time Calculation (min)  51 min    Activity Tolerance  Patient tolerated treatment well    Behavior During Therapy  [facility] for tasks assessed/performed       Past Medical History:  Diagnosis Date  . Allergy   . Arthritis    feet  . Bronchitis 10/30/2016  . Carpal tunnel syndrome   . Coronary artery disease   . GERD (gastroesophageal reflux disease)   . Hyperlipidemia   . Hypertension   . Non-ST elevation (NSTEMI) myocardial infarction ([place]) 10/30/2016  . NSTEMI (non-ST elevated myocardial infarction) ([place]) 10/30/2016    Past Surgical History:  Procedure Laterality Date  . CARPAL TUNNEL RELEASE Left 02/12/2016   Procedure: LEFT CARPAL TUNNEL RELEASE;  Surgeon: Daryll Brod, MD;  Location: [facility];  Service: Orthopedics;  Laterality: Left;  FAB  . CORONARY ARTERY BYPASS GRAFT N/A 01/10/2018   Procedure: CORONARY ARTERY BYPASS GRAFTING (CABG) TIMES 4, USING LEFT INTERNAL MAMMARY ARTERY TO OM1  AND ENDOSCOPICALLY HARVESTED RIGHT SAPHENOUS VEIN TO DIAGONAL, AND SEQUENTIALLY TO OM2 AND DISTAL CIRCUMFLEX;  Surgeon: Grace Isaac, MD;  Location: [facility];  Service:  Open Heart Surgery;  Laterality: N/A;  . LEFT HEART CATH AND CORONARY ANGIOGRAPHY N/A 01/05/2018   Procedure: LEFT HEART CATH AND CORONARY ANGIOGRAPHY;  Surgeon: Troy Sine, MD;  Location: [facility];  Service: Cardiovascular;  Laterality: N/A;  . TEE WITHOUT CARDIOVERSION N/A 01/10/2018   Procedure: TRANSESOPHAGEAL ECHOCARDIOGRAM (TEE);  Surgeon: Grace Isaac, MD;  Location: [facility];  Service: Open Heart Surgery;  Laterality: N/A;  . TONSILLECTOMY    . TUBAL LIGATION    . WRIST SURGERY Right     There were no vitals filed for this visit.  Subjective Assessment - 05/22/19 0807    Subjective  Pt reporting she feels PT has been helping and also notes benefit from taping, DN and estim (TENS).    Pertinent History  CABG x 4 - 01/10/18    Patient Stated Goals  "to have the pressure in my chest relieved"    Currently in Pain?  Yes    Pain Score  3     Pain Location  Chest    Pain Orientation  Left;Anterior;Medial    Pain Descriptors / Indicators  Pressure    Pain Type  Chronic pain    Pain Frequency  Intermittent    Pain Score  5    Pain Location  Neck   & upper back/shoulders   Pain Orientation  Right;Left  Pain Descriptors / Indicators  Pressure    Pain Type  Chronic pain    Pain Frequency  Intermittent         [facility] PT Assessment - 05/22/19 0802      Assessment   Medical Diagnosis  Atypical chest pain    Referring Provider (PT)  Jenne Campus, MD    Onset Date/Surgical Date  --   Fall 2019   Hand Dominance  Right    Next MD Visit  Feb 2021      AROM   Right Shoulder Flexion  151 Degrees    Right Shoulder ABduction  147 Degrees    Right Shoulder Internal Rotation  --   FIR to bra   Right Shoulder External Rotation  --   FER to T3   Left Shoulder Flexion  150 Degrees    Left Shoulder ABduction  146 Degrees    Left Shoulder Internal Rotation  --   FIR to bra   Left Shoulder External Rotation  --   FER to T3   Cervical Flexion  58    Cervical  Extension  45 - pain & "pulling" in chest    Cervical - Right Side Bend  33    Cervical - Left Side Bend  28 - pain & "pulling" in neck    Cervical - Right Rotation  75    Cervical - Left Rotation  57      Strength   Right Shoulder Flexion  4+/5    Right Shoulder Extension  4/5    Right Shoulder ABduction  4+/5    Right Shoulder Internal Rotation  4+/5    Right Shoulder External Rotation  4/5    Left Shoulder Flexion  4+/5    Left Shoulder Extension  4/5    Left Shoulder ABduction  4+/5    Left Shoulder Internal Rotation  4+/5    Left Shoulder External Rotation  4/5                   [facility] Adult PT Treatment/Exercise - 05/22/19 0802      Self-Care   Other Self-Care Comments   Answered patient's questions regarding home TENS set-up, detailing electrode placement patterns to address pectoralis pain as well as cervical, thoracic and shoulder pain.       Shoulder Exercises: ROM/Strengthening   Nustep  L3 x 6 min (UE/LE)      Manual Therapy   Manual Therapy  Taping    Kinesiotex  Inhibit Muscle      Kinesiotix   Inhibit Muscle   B UT & cervicothoracic paraspinals 30%;  L pec minor - 2 strips ~30% from insertion on coracoid process with 1st strip extending medially toward sternum and upper breast and 2nd strip essentially parallel to 1st strip but slightly lateral               PT Short Term Goals - 05/11/19 0810      PT SHORT TERM GOAL #1   Title  Patient will be independent with initial HEP    Status  Achieved   04/16/19     PT SHORT TERM GOAL #2   Title  Patient to demonstrate understanding of good posture    Status  Achieved   04/16/2019     PT SHORT TERM GOAL #3   Title  Patient to report pain reduction in frequency and intensity by >/= 25%    Status  Achieved   30% improved as  of 05/08/19       PT Long Term Goals - 05/22/19 0810      PT LONG TERM GOAL #1   Title  Patient will be independent with ongoing/advanced HEP    Status  Partially  Met      PT LONG TERM GOAL #2   Title  Patient to demonstrate appropriate posture and body mechanics needed for daily activities    Status  Achieved      PT LONG TERM GOAL #3   Title  Patient to report pain reduction in frequency and intensity by >/= 50%    Status  Achieved      PT LONG TERM GOAL #4   Title  Patient to improve cervical and B shoulder AROM to WFL/WNL without pain provocation    Status  Partially Met   05/22/19 - met for shoulder ROM     PT LONG TERM GOAL #5   Title  Patient to report ability to sleep as well as perform ADLs and household tasks without pain interference    Status  Partially Met            Plan - 05/22/19 0853    Clinical Impression Statement  Janea reporting ~50% improvement in pain and ability to function with decreased pain interference since starting PT, although still reports some sleep disturbance as well as limitations with lifting and activities requiring more overhead reaching or looking up. She notes improving postural awareness, and reports making a concerted effort to look up when walking for exercise where her previous tendency was to look down while walking. B shoulder ROM now essentially WFL/WNL, however limitations still persist in cervical ROM due to pain and "pulling" with greatest restriction noted in cervical extension & L lateral flexion. She reports good compliance with HEP and can perform good return demonstration of HEP but will continue to benefit from progression/update of HEP. Overall Keslie is progressing well with PT with all STGs and LTG #3 (postural awareness) met along with remaining LTGs at least partially met. Patient has only been able to attend 6 of 12 approved Medicaid visits due to transportation issues (car trouble) and family emergency with her son, but would continue to benefit from skilled PT to further address ongoing pain and functional limitations, therefore anticipate PT will recommend recert for additional 2x/wk  for 4-6 weeks as of the new year.    Personal Factors and Comorbidities  Comorbidity 3+;Time since onset of injury/illness/exacerbation;Past/Current Experience    Comorbidities  CAD s/p NSTEMI 10/30/16 & CABG x 4 01/10/18; HTN; cervicalgia; carpal tunnel syndrome s/p CTR 02/12/16; GERD; bronchitis    Examination-Activity Limitations  Carry;Lift;Reach Overhead;Sleep    Examination-Participation Restrictions  Laundry;Shop;Cleaning    Rehab Potential  Good    PT Frequency  2x / week    PT Duration  8 weeks    PT Treatment/Interventions  ADLs/Self Care Home Management;Cryotherapy;Electrical Stimulation;Iontophoresis 34m/ml Dexamethasone;Moist Heat;Ultrasound;Functional mobility training;Therapeutic activities;Therapeutic exercise;Neuromuscular re-education;Patient/family education;Manual techniques;Scar mobilization;Passive range of motion;Dry needling;Taping;Vasopneumatic Device;Spinal Manipulations;Joint Manipulations    PT Next Visit Plan  VS check PRN; postural stretching and strengthening; manual therapy including STM/MFR to for muscle spams/tightness; modalities PRN    PT Home Exercise Plan  04/02/19 - 3-way doorway pec stretch (high position deferred as of 04/10/19), reverse shoulder rolls, scap retraction, cervical retraction; 04/12/19 - UT, LS & SCM stretches, yellow (progressed to red as of 05/01/19) TB rows & retraction; 05/08/19 - UE brachial plexus nerve glide    Consulted and Agree  with Plan of Care  Patient       Patient will benefit from skilled therapeutic intervention in order to improve the following deficits and impairments:  Cardiopulmonary status limiting activity, Decreased activity tolerance, Decreased mobility, Decreased range of motion, Decreased scar mobility, Decreased strength, Hypomobility, Increased fascial restricitons, Increased muscle spasms, Impaired perceived functional ability, Impaired flexibility, Impaired UE functional use, Improper body mechanics, Postural dysfunction,  Pain  Visit Diagnosis: Other symptoms and signs involving the musculoskeletal system  Cervicalgia  Abnormal posture  Muscle weakness (generalized)     Problem List Patient Active Problem List   Diagnosis Date Noted  . Atypical chest pain 10/24/2018  . S/P CABG x 4 01/10/2018  . Hyperlipidemia 01/09/2018  . Unstable angina ([place]) 01/05/2018  . CAD (coronary artery disease), native coronary artery 12/28/2017  . Asymptomatic microscopic hematuria 11/14/2017  . Iron deficiency anemia 09/05/2017  . Cervicalgia 12/18/2015  . Benign essential HTN 04/08/2015  . Metatarsalgia of right foot 02/11/2015    Percival Spanish, PT, MPT 05/22/2019, 10:54 AM  [facility] [address]  [place], [place], [address] Phone: [phone]   Fax:  [phone]  Name: TASHUNDA VANDEZANDE MRN: [id number] Date of Birth: 03/11/1956

## 2019-05-24 ENCOUNTER — Ambulatory Visit: Payer: Medicaid Other | Admitting: Physical Therapy

## 2019-05-24 ENCOUNTER — Encounter: Payer: Self-pay | Admitting: Physical Therapy

## 2019-05-24 ENCOUNTER — Other Ambulatory Visit: Payer: Self-pay

## 2019-05-24 DIAGNOSIS — M6281 Muscle weakness (generalized): Secondary | ICD-10-CM

## 2019-05-24 DIAGNOSIS — R29898 Other symptoms and signs involving the musculoskeletal system: Secondary | ICD-10-CM | POA: Diagnosis not present

## 2019-05-24 DIAGNOSIS — R293 Abnormal posture: Secondary | ICD-10-CM

## 2019-05-24 DIAGNOSIS — M542 Cervicalgia: Secondary | ICD-10-CM | POA: Diagnosis not present

## 2019-05-24 NOTE — Patient Instructions (Signed)
    Home exercise program created by Yohannes Waibel, PT.  For questions, please contact Rida Loudin via phone at [phone] or email at [email or web address]  [facility] [address]  [address] [place], [place], [address] Phone: [phone]   Fax:  [phone]    

## 2019-05-24 NOTE — Therapy (Addendum)
[facility] [address]  [place], [place], [address] Phone: [phone]   Fax:  [phone]  Physical Therapy Treatment / Recert  Patient Details  Name: Diana [name] MRN: [id number] Date of Birth: 1956/02/06 Referring Provider (PT): Jenne Campus, MD   Encounter Date: 05/24/2019  PT End of Session - 05/24/19 0802    Visit Number  11    Number of Visits  24    Date for PT Re-Evaluation  07/13/19    Authorization Type  Medicaid    Authorization Time Period  04/20/19 - 05/31/19    Authorization - Visit Number  7    Authorization - Number of Visits  12    PT Start Time  0802    PT Stop Time  0852    PT Time Calculation (min)  50 min    Activity Tolerance  Patient tolerated treatment well    Behavior During Therapy  [facility] for tasks assessed/performed       Past Medical History:  Diagnosis Date  . Allergy   . Arthritis    feet  . Bronchitis 10/30/2016  . Carpal tunnel syndrome   . Coronary artery disease   . GERD (gastroesophageal reflux disease)   . Hyperlipidemia   . Hypertension   . Non-ST elevation (NSTEMI) myocardial infarction ([place]) 10/30/2016  . NSTEMI (non-ST elevated myocardial infarction) ([place]) 10/30/2016    Past Surgical History:  Procedure Laterality Date  . CARPAL TUNNEL RELEASE Left 02/12/2016   Procedure: LEFT CARPAL TUNNEL RELEASE;  Surgeon: Daryll Brod, MD;  Location: [place];  Service: Orthopedics;  Laterality: Left;  FAB  . CORONARY ARTERY BYPASS GRAFT N/A 01/10/2018   Procedure: CORONARY ARTERY BYPASS GRAFTING (CABG) TIMES 4, USING LEFT INTERNAL MAMMARY ARTERY TO OM1  AND ENDOSCOPICALLY HARVESTED RIGHT SAPHENOUS VEIN TO DIAGONAL, AND SEQUENTIALLY TO OM2 AND DISTAL CIRCUMFLEX;  Surgeon: Grace Isaac, MD;  Location: [place];  Service: Open Heart Surgery;  Laterality: N/A;  . LEFT HEART CATH AND CORONARY ANGIOGRAPHY N/A 01/05/2018   Procedure: LEFT HEART CATH AND CORONARY  ANGIOGRAPHY;  Surgeon: Troy Sine, MD;  Location: [place] [facility];  Service: Cardiovascular;  Laterality: N/A;  . TEE WITHOUT CARDIOVERSION N/A 01/10/2018   Procedure: TRANSESOPHAGEAL ECHOCARDIOGRAM (TEE);  Surgeon: Grace Isaac, MD;  Location: [place];  Service: Open Heart Surgery;  Laterality: N/A;  . TONSILLECTOMY    . TUBAL LIGATION    . WRIST SURGERY Right     There were no vitals filed for this visit.  Subjective Assessment - 05/24/19 0805    Subjective  Pt reporting extending the tape down her back seemed to help. Reports she was reaching to the back seat with her L arm yesterday when she felt a sharp pain.    Pertinent History  CABG x 4 - 01/10/18    Patient Stated Goals  "to have the pressure in my chest relieved"    Currently in Pain?  Yes    Pain Score  3     Pain Location  Chest    Pain Orientation  Left;Anterior;Medial    Pain Descriptors / Indicators  Sharp    Pain Type  Chronic pain    Pain Frequency  Intermittent    Pain Score  2    Pain Location  Neck   & upper back/shoulders   Pain Orientation  Right;Left    Pain Descriptors / Indicators  Sore  Pain Type  Chronic pain    Pain Frequency  Intermittent         [facility] PT Assessment - 05/24/19 0802      Assessment   Medical Diagnosis  Atypical chest pain    Referring Provider (PT)  Jenne Campus, MD    Onset Date/Surgical Date  --   Fall 2019   Hand Dominance  Right    Next MD Visit  Feb 2021      Prior Function   Level of Independence  Independent    Vocation  Unemployed   since surgery   Leisure  online exercises 3x/wk, walking 5 days/week                   [facility] Adult PT Treatment/Exercise - 05/24/19 0802      Exercises   Exercises  Neck      Neck Exercises: Theraband   Shoulder External Rotation  10 reps;Red    Shoulder External Rotation Limitations  hooklying on pool noodle - cues for scap retraction into noodle    Horizontal ABduction  10 reps;Red    Horizontal  ABduction Limitations  hooklying on pool noodle - cues for scap retraction into noodle    Other Theraband Exercises  Alt UE diagonals + scap retraction with red TB x 10; hooklying on pool noodle      Shoulder Exercises: ROM/Strengthening   UBE (Upper Arm Bike)  L2.5 x 6 min (3' fwd/3' back)      Shoulder Exercises: Stretch   Cross Chest Stretch  60 seconds;1 rep    Cross Chest Stretch Limitations  "snow angel" stretch over pool noodle      Manual Therapy   Manual Therapy  Soft tissue mobilization;Myofascial release;Taping    Soft tissue mobilization  STM to L medial pec major/minor & sternal incision    Myofascial Release  manual TPR to L medial pec major/minor    Kinesiotex  Inhibit Muscle      Kinesiotix   Inhibit Muscle   B UT & LS 30%; L pec minor - 2 strips ~30% from insertion on coracoid process with 1st strip extending medially toward sternum and upper breast and 2nd strip essentially parallel to 1st strip but slightly lateral             PT Education - 05/24/19 0852    Education Details  HEP update - hooklying scapular retraction exercises with red TB    Person(s) Educated  Patient    Methods  Explanation;Demonstration;Handout    Comprehension  Verbalized understanding;Returned demonstration;Need further instruction       PT Short Term Goals - 05/11/19 0810      PT SHORT TERM GOAL #1   Title  Patient will be independent with initial HEP    Status  Achieved   04/16/19     PT SHORT TERM GOAL #2   Title  Patient to demonstrate understanding of good posture    Status  Achieved   04/16/2019     PT SHORT TERM GOAL #3   Title  Patient to report pain reduction in frequency and intensity by >/= 25%    Status  Achieved   30% improved as of 05/08/19       PT Long Term Goals - 05/24/19 0810      PT LONG TERM GOAL #1   Title  Patient will be independent with ongoing/advanced HEP    Status  Partially Met    Target Date  07/13/19  PT LONG TERM GOAL #2    Title  Patient to demonstrate appropriate posture and body mechanics needed for daily activities    Status  Achieved      PT LONG TERM GOAL #3   Title  Patient to report pain reduction in frequency and intensity by >/= 75%    Status  Revised    Target Date  07/13/19      PT LONG TERM GOAL #4   Title  Patient to improve cervical and B shoulder AROM to WFL/WNL without pain provocation    Status  Partially Met   05/22/19 - met for shoulder ROM   Target Date  07/13/19      PT LONG TERM GOAL #5   Title  Patient to report ability to sleep as well as perform ADLs and household tasks without pain interference    Status  Partially Met    Target Date  07/13/19            Plan - 05/24/19 3419    Clinical Impression Statement  Agnes reporting exacerbation of her chest pain after feeling a sharp pain in her L chest and L arm while attempting to reach into the back seat of the care from the passenger's seat yesterday, with pain remaining elevated today. Patient ttp over sternal incision from open heart surgery with decreased scar mobility as well as ttp and increased muscle tension noted in medial pecs. Addressed these areas with STM/MFR including scar mobilization followed by kinesiotape application with patient noting less ttp and feeling of muscle spasm following manual therapy. Provided instruction in self-STM/scar mobilization for replication at home. Patient requesting review of hook lying scapular retraction exercise for home as she has been able to use a rolled towel over a broom handle to replicate the pool noodle used in therapy sessions. Laronica reporting ~50% improvement in pain and ability to function with decreased pain interference since starting PT, although still reports some sleep disturbance as well as limitations with lifting and activities requiring more overhead reaching or looking up. She notes improving postural awareness, and reports making a concerted effort to look up when  walking for exercise where her previous tendency was to look down while walking. B shoulder ROM now essentially WFL/WNL, however limitations still persist in cervical ROM due to pain and "pulling" with greatest restriction noted in cervical extension & L lateral flexion. She reports good compliance with HEP and can perform good return demonstration of HEP but will continue to benefit from progression/update of HEP. Overall Jamileth is progressing well with PT with all STGs and LTG #3 (postural awareness) met along with remaining LTGs at least partially met. Patient will continue to benefit from skilled PT to further address ongoing pain and functional limitations, therefore will recommend recert for additional 2x/wk for 4-6 weeks as of the new year.    Personal Factors and Comorbidities  Comorbidity 3+;Time since onset of injury/illness/exacerbation;Past/Current Experience    Comorbidities  CAD s/p NSTEMI 10/30/16 & CABG x 4 01/10/18; HTN; cervicalgia; carpal tunnel syndrome s/p CTR 02/12/16; GERD; bronchitis    Examination-Activity Limitations  Carry;Lift;Reach Overhead;Sleep    Examination-Participation Restrictions  Laundry;Shop;Cleaning    Rehab Potential  Good    PT Frequency  2x / week    PT Duration  6 weeks   4-6 weeks   PT Treatment/Interventions  ADLs/Self Care Home Management;Cryotherapy;Electrical Stimulation;Iontophoresis 43m/ml Dexamethasone;Moist Heat;Ultrasound;Functional mobility training;Therapeutic activities;Therapeutic exercise;Neuromuscular re-education;Patient/family education;Manual techniques;Scar mobilization;Passive range of motion;Dry needling;Taping;Vasopneumatic Device;Spinal Manipulations;Joint Manipulations  PT Next Visit Plan  VS check PRN; postural stretching and strengthening; manual therapy including STM/MFR to for muscle spams/tightness; modalities PRN    PT Home Exercise Plan  04/02/19 - 3-way doorway pec stretch (high position deferred as of 04/10/19), reverse shoulder  rolls, scap retraction, cervical retraction; 04/12/19 - UT, LS & SCM stretches, yellow (progressed to red as of 05/01/19) TB rows & retraction; 05/08/19 - UE brachial plexus nerve glide; 05/24/19 - hooklying scapular retraction exercises with red TB    Consulted and Agree with Plan of Care  Patient       Patient will benefit from skilled therapeutic intervention in order to improve the following deficits and impairments:  Cardiopulmonary status limiting activity, Decreased activity tolerance, Decreased mobility, Decreased range of motion, Decreased scar mobility, Decreased strength, Hypomobility, Increased fascial restricitons, Increased muscle spasms, Impaired perceived functional ability, Impaired flexibility, Impaired UE functional use, Improper body mechanics, Postural dysfunction, Pain  Visit Diagnosis: Other symptoms and signs involving the musculoskeletal system - Plan: PT plan of care cert/re-cert  Cervicalgia - Plan: PT plan of care cert/re-cert  Abnormal posture - Plan: PT plan of care cert/re-cert  Muscle weakness (generalized) - Plan: PT plan of care cert/re-cert     Problem List Patient Active Problem List   Diagnosis Date Noted  . Atypical chest pain 10/24/2018  . S/P CABG x 4 01/10/2018  . Hyperlipidemia 01/09/2018  . Unstable angina ([place]) 01/05/2018  . CAD (coronary artery disease), native coronary artery 12/28/2017  . Asymptomatic microscopic hematuria 11/14/2017  . Iron deficiency anemia 09/05/2017  . Cervicalgia 12/18/2015  . Benign essential HTN 04/08/2015  . Metatarsalgia of right foot 02/11/2015    Percival Spanish, PT, MPT 05/24/2019, 4:35 PM  [facility] [address]  [place], [place], [address] Phone: [phone]   Fax:  [phone]  Name: ALIZAYA OSHEA MRN: [id number] Date of Birth: 02-Oct-1955

## 2019-05-30 ENCOUNTER — Other Ambulatory Visit: Payer: Self-pay

## 2019-05-30 ENCOUNTER — Ambulatory Visit: Payer: Medicaid Other | Admitting: Physical Therapy

## 2019-05-30 ENCOUNTER — Encounter: Payer: Self-pay | Admitting: Physical Therapy

## 2019-05-30 DIAGNOSIS — R29898 Other symptoms and signs involving the musculoskeletal system: Secondary | ICD-10-CM | POA: Diagnosis not present

## 2019-05-30 DIAGNOSIS — M542 Cervicalgia: Secondary | ICD-10-CM

## 2019-05-30 DIAGNOSIS — M6281 Muscle weakness (generalized): Secondary | ICD-10-CM

## 2019-05-30 DIAGNOSIS — R293 Abnormal posture: Secondary | ICD-10-CM | POA: Diagnosis not present

## 2019-05-30 NOTE — Therapy (Signed)
[facility] [address]  [place], [place], [address] Phone: [phone]   Fax:  [phone]  Physical Therapy Treatment  Patient Details  Name: Diana [name] MRN: [id number] Date of Birth: Nov 08, 1955 Referring Provider (PT): Jenne Campus, MD   Encounter Date: 05/30/2019  PT End of Session - 05/30/19 0803    Visit Number  12    Number of Visits  24    Date for PT Re-Evaluation  07/13/19    Authorization Type  Medicaid    Authorization Time Period  04/20/19 - 05/31/19    Authorization - Visit Number  8    Authorization - Number of Visits  12    PT Start Time  0803    PT Stop Time  0901    PT Time Calculation (min)  58 min    Activity Tolerance  Patient tolerated treatment well    Behavior During Therapy  [facility] for tasks assessed/performed       Past Medical History:  Diagnosis Date  . Allergy   . Arthritis    feet  . Bronchitis 10/30/2016  . Carpal tunnel syndrome   . Coronary artery disease   . GERD (gastroesophageal reflux disease)   . Hyperlipidemia   . Hypertension   . Non-ST elevation (NSTEMI) myocardial infarction ([place]) 10/30/2016  . NSTEMI (non-ST elevated myocardial infarction) ([place]) 10/30/2016    Past Surgical History:  Procedure Laterality Date  . CARPAL TUNNEL RELEASE Left 02/12/2016   Procedure: LEFT CARPAL TUNNEL RELEASE;  Surgeon: Daryll Brod, MD;  Location: [place];  Service: Orthopedics;  Laterality: Left;  FAB  . CORONARY ARTERY BYPASS GRAFT N/A 01/10/2018   Procedure: CORONARY ARTERY BYPASS GRAFTING (CABG) TIMES 4, USING LEFT INTERNAL MAMMARY ARTERY TO OM1  AND ENDOSCOPICALLY HARVESTED RIGHT SAPHENOUS VEIN TO DIAGONAL, AND SEQUENTIALLY TO OM2 AND DISTAL CIRCUMFLEX;  Surgeon: Grace Isaac, MD;  Location: [place];  Service: Open Heart Surgery;  Laterality: N/A;  . LEFT HEART CATH AND CORONARY ANGIOGRAPHY N/A 01/05/2018   Procedure: LEFT HEART CATH AND CORONARY ANGIOGRAPHY;   Surgeon: Troy Sine, MD;  Location: [place] [facility];  Service: Cardiovascular;  Laterality: N/A;  . TEE WITHOUT CARDIOVERSION N/A 01/10/2018   Procedure: TRANSESOPHAGEAL ECHOCARDIOGRAM (TEE);  Surgeon: Grace Isaac, MD;  Location: [place];  Service: Open Heart Surgery;  Laterality: N/A;  . TONSILLECTOMY    . TUBAL LIGATION    . WRIST SURGERY Right     There were no vitals filed for this visit.  Subjective Assessment - 05/30/19 0805    Subjective  Patient reporting her neck and shoulders are bothering her more today, probably because she has been continuing to try and clean out her storage unit.    Pertinent History  CABG x 4 - 01/10/18    Patient Stated Goals  "to have the pressure in my chest relieved"    Currently in Pain?  Yes    Pain Score  2     Pain Location  Chest    Pain Orientation  Left;Anterior;Medial    Pain Descriptors / Indicators  Stabbing   "pulling"   Pain Type  Chronic pain    Pain Frequency  Intermittent    Pain Score  5    Pain Location  Neck   & upper back/shoulders   Pain Orientation  Right;Left    Pain Descriptors / Indicators  --   "just hurting"   Pain Type  Chronic pain                       [facility] Adult PT Treatment/Exercise - 05/30/19 0844      Shoulder Exercises: ROM/Strengthening   UBE (Upper Arm Bike)  L2.5 x 6 min (3' fwd/3' back)      Modalities   Modalities  Electrical Stimulation;Moist Heat      Moist Heat Therapy   Number Minutes Moist Heat  15 Minutes    Moist Heat Location  Cervical;Shoulder      Electrical Stimulation   Electrical Stimulation Location  B upper shoulders    Electrical Stimulation Action  IFC    Electrical Stimulation Parameters  80-150 Hz, intensity to pt tolerance x 15'    Electrical Stimulation Goals  Pain;Tone      Manual Therapy   Manual Therapy  Soft tissue mobilization;Myofascial release;Scapular mobilization    Soft tissue mobilization  STM to B lower cervical paraspinals, UT/LS  and periscapular muscles    Myofascial Release  manual TPR to R>L LS, L>R Ut, R rhomboids    Scapular Mobilization  R scapular mobs - all directions       Trigger Point Dry Needling - 05/30/19 0803    Consent Given?  Yes    Muscles Treated Head and Neck  Upper trapezius;Levator scapulae    Muscles Treated Upper Quadrant  Rhomboids    Upper Trapezius Response  Twitch reponse elicited;Palpable increased muscle length   Left   Levator Scapulae Response  Twitch response elicited;Palpable increased muscle length   Bilateral   Rhomboids Response  Twitch response elicited;Palpable increased muscle length   Right            PT Short Term Goals - 05/11/19 0810      PT SHORT TERM GOAL #1   Title  Patient will be independent with initial HEP    Status  Achieved   04/16/19     PT SHORT TERM GOAL #2   Title  Patient to demonstrate understanding of good posture    Status  Achieved   04/16/2019     PT SHORT TERM GOAL #3   Title  Patient to report pain reduction in frequency and intensity by >/= 25%    Status  Achieved   30% improved as of 05/08/19       PT Long Term Goals - 05/24/19 0810      PT LONG TERM GOAL #1   Title  Patient will be independent with ongoing/advanced HEP    Status  Partially Met    Target Date  07/13/19      PT LONG TERM GOAL #2   Title  Patient to demonstrate appropriate posture and body mechanics needed for daily activities    Status  Achieved      PT LONG TERM GOAL #3   Title  Patient to report pain reduction in frequency and intensity by >/= 75%    Status  Revised    Target Date  07/13/19      PT LONG TERM GOAL #4   Title  Patient to improve cervical and B shoulder AROM to WFL/WNL without pain provocation    Status  Partially Met   05/22/19 - met for shoulder ROM   Target Date  07/13/19      PT LONG TERM GOAL #5   Title  Patient to report ability to sleep as well as perform ADLs and household tasks without pain interference  Status   Partially Met    Target Date  07/13/19            Plan - 05/30/19 0810    Clinical Impression Statement  Diana [name] noting increased neck and shoulder soreness, R>L today, as she continues to work to clean out her storage unit. Increased muscle tension persisting in upper shoulder and periscapular muscles, especially R LS and rhomboids today which was addressed with manual therapy including STM/MFR, DN and scapular mobilization followed by estim and moist heat to B upper shoulders to promote further muscle relaxation. Encouraged patient to remain aware of posture and lifting mechanics as she continues to empty out her storage unit to avoid re-exacerbating her pain.    Personal Factors and Comorbidities  Comorbidity 3+;Time since onset of injury/illness/exacerbation;Past/Current Experience    Comorbidities  CAD s/p NSTEMI 10/30/16 & CABG x 4 01/10/18; HTN; cervicalgia; carpal tunnel syndrome s/p CTR 02/12/16; GERD; bronchitis    Examination-Activity Limitations  Carry;Lift;Reach Overhead;Sleep    Examination-Participation Restrictions  Laundry;Shop;Cleaning    Rehab Potential  Good    PT Frequency  2x / week    PT Duration  6 weeks   4-6 weeks   PT Treatment/Interventions  ADLs/Self Care Home Management;Cryotherapy;Electrical Stimulation;Iontophoresis 64m/ml Dexamethasone;Moist Heat;Ultrasound;Functional mobility training;Therapeutic activities;Therapeutic exercise;Neuromuscular re-education;Patient/family education;Manual techniques;Scar mobilization;Passive range of motion;Dry needling;Taping;Vasopneumatic Device;Spinal Manipulations;Joint Manipulations    PT Next Visit Plan  VS check PRN; postural stretching and strengthening; manual therapy including STM/MFR to for muscle spams/tightness; modalities PRN    PT Home Exercise Plan  04/02/19 - 3-way doorway pec stretch (high position deferred as of 04/10/19), reverse shoulder rolls, scap retraction, cervical retraction; 04/12/19 - UT, LS & SCM stretches,  yellow (progressed to red as of 05/01/19) TB rows & retraction; 05/08/19 - UE brachial plexus nerve glide; 05/24/19 - hooklying scapular retraction exercises with red TB    Consulted and Agree with Plan of Care  Patient       Patient will benefit from skilled therapeutic intervention in order to improve the following deficits and impairments:  Cardiopulmonary status limiting activity, Decreased activity tolerance, Decreased mobility, Decreased range of motion, Decreased scar mobility, Decreased strength, Hypomobility, Increased fascial restricitons, Increased muscle spasms, Impaired perceived functional ability, Impaired flexibility, Impaired UE functional use, Improper body mechanics, Postural dysfunction, Pain  Visit Diagnosis: Other symptoms and signs involving the musculoskeletal system  Cervicalgia  Abnormal posture  Muscle weakness (generalized)     Problem List Patient Active Problem List   Diagnosis Date Noted  . Atypical chest pain 10/24/2018  . S/P CABG x 4 01/10/2018  . Hyperlipidemia 01/09/2018  . [place] ([place] 01/05/2018  . CAD (coronary artery disease), native coronary artery 12/28/2017  . Asymptomatic microscopic hematuria 11/14/2017  . Iron deficiency anemia 09/05/2017  . Cervicalgia 12/18/2015  . Benign essential HTN 04/08/2015  . Metatarsalgia of right foot 02/11/2015    JPercival Spanish PT, MPT 05/30/2019, 2:29 PM  [facility] [place] [address]Phone: [phone]  Fax:  [phone] Name: PGRETCHEN WEINFELDMRN: [id number] of Birth: [birth year]

## 2019-06-05 ENCOUNTER — Ambulatory Visit: Payer: Medicaid Other | Attending: Cardiology | Admitting: Physical Therapy

## 2019-06-05 ENCOUNTER — Encounter: Payer: Self-pay | Admitting: Physical Therapy

## 2019-06-05 ENCOUNTER — Other Ambulatory Visit: Payer: Self-pay

## 2019-06-05 DIAGNOSIS — R293 Abnormal posture: Secondary | ICD-10-CM | POA: Diagnosis not present

## 2019-06-05 DIAGNOSIS — M542 Cervicalgia: Secondary | ICD-10-CM | POA: Diagnosis not present

## 2019-06-05 DIAGNOSIS — M6281 Muscle weakness (generalized): Secondary | ICD-10-CM | POA: Diagnosis not present

## 2019-06-05 DIAGNOSIS — R29898 Other symptoms and signs involving the musculoskeletal system: Secondary | ICD-10-CM

## 2019-06-05 NOTE — Therapy (Signed)
[facility] [address]  [place], [place], [address] Phone: [phone]   Fax:  [phone]  Physical Therapy Treatment  Patient Details  Name: Diana [name] MRN: [id number] Date of Birth: 06-20-1955 Referring Provider (PT): Jenne Campus, MD   Encounter Date: 06/05/2019  PT End of Session - 06/05/19 0803    Visit Number  13    Number of Visits  24    Date for PT Re-Evaluation  07/13/19    Authorization Type  Medicaid    Authorization Time Period  06/04/19 - 06/13/19    Authorization - Visit Number  1    Authorization - Number of Visits  3    PT Start Time  0803    PT Stop Time  0851    PT Time Calculation (min)  48 min    Activity Tolerance  Patient tolerated treatment well    Behavior During Therapy  [facility] for tasks assessed/performed       Past Medical History:  Diagnosis Date  . Allergy   . Arthritis    feet  . Bronchitis 10/30/2016  . Carpal tunnel syndrome   . Coronary artery disease   . GERD (gastroesophageal reflux disease)   . Hyperlipidemia   . Hypertension   . Non-ST elevation (NSTEMI) myocardial infarction ([place]) 10/30/2016  . NSTEMI (non-ST elevated myocardial infarction) ([place]) 10/30/2016    Past Surgical History:  Procedure Laterality Date  . CARPAL TUNNEL RELEASE Left 02/12/2016   Procedure: LEFT CARPAL TUNNEL RELEASE;  Surgeon: Daryll Brod, MD;  Location: [place];  Service: Orthopedics;  Laterality: Left;  FAB  . CORONARY ARTERY BYPASS GRAFT N/A 01/10/2018   Procedure: CORONARY ARTERY BYPASS GRAFTING (CABG) TIMES 4, USING LEFT INTERNAL MAMMARY ARTERY TO OM1  AND ENDOSCOPICALLY HARVESTED RIGHT SAPHENOUS VEIN TO DIAGONAL, AND SEQUENTIALLY TO OM2 AND DISTAL CIRCUMFLEX;  Surgeon: Grace Isaac, MD;  Location: [place];  Service: Open Heart Surgery;  Laterality: N/A;  . LEFT HEART CATH AND CORONARY ANGIOGRAPHY N/A 01/05/2018   Procedure: LEFT HEART CATH AND CORONARY ANGIOGRAPHY;   Surgeon: Troy Sine, MD;  Location: [place] [facility];  Service: Cardiovascular;  Laterality: N/A;  . TEE WITHOUT CARDIOVERSION N/A 01/10/2018   Procedure: TRANSESOPHAGEAL ECHOCARDIOGRAM (TEE);  Surgeon: Grace Isaac, MD;  Location: [place];  Service: Open Heart Surgery;  Laterality: N/A;  . TONSILLECTOMY    . TUBAL LIGATION    . WRIST SURGERY Right     There were no vitals filed for this visit.  Subjective Assessment - 06/05/19 0805    Subjective  Pt reporting benefit from the DN last session but missed the kinesiotape.    Pertinent History  CABG x 4 - 01/10/18    Patient Stated Goals  "to have the pressure in my chest relieved"    Currently in Pain?  Yes    Pain Score  4     Pain Location  Chest    Pain Orientation  Left;Anterior;Medial    Pain Descriptors / Indicators  Discomfort    Pain Type  Chronic pain    Pain Score  7    Pain Location  Neck   & shoulder/upper back   Pain Orientation  Right    Pain Descriptors / Indicators  Sore;Heaviness;Throbbing    Pain Type  Chronic pain    Pain Frequency  Intermittent  [facility] Adult PT Treatment/Exercise - 06/05/19 0803      Self-Care   Posture  Review of proper posture with daily household chores, esp washing dishes.       Exercises   Exercises  Neck      Neck Exercises: Machines for Strengthening   Cybex Row  15# x 10 - cues for scap retraction    Lat Pull  10# x 10 - verbal/tactile cues to avoid shoulder shrug      Shoulder Exercises: ROM/Strengthening   UBE (Upper Arm Bike)  L2.5 x 6 min (3' fwd/3' back)      Shoulder Exercises: IT sales professional  30 seconds;2 reps   Advertising copywriter Limitations  3-way doorway pec stretch - cues for proper alignment and increased hold time      Manual Therapy   Manual Therapy  Soft tissue mobilization;Myofascial release;Taping    Soft tissue mobilization  STM to L medial pec major/minor & sternal incision, R UT    Myofascial  Release  manual TPR to L medial pec major/minor & R UT    Kinesiotex  Inhibit Muscle      Kinesiotix   Inhibit Muscle   B UT & LS 30%; L pec minor - 2 strips ~30% from insertion on coracoid process with 1st strip extending medially toward sternum and upper breast and 2nd strip essentially parallel to 1st strip but slightly lateral               PT Short Term Goals - 05/11/19 0810      PT SHORT TERM GOAL #1   Title  Patient will be independent with initial HEP    Status  Achieved   04/16/19     PT SHORT TERM GOAL #2   Title  Patient to demonstrate understanding of good posture    Status  Achieved   04/16/2019     PT SHORT TERM GOAL #3   Title  Patient to report pain reduction in frequency and intensity by >/= 25%    Status  Achieved   30% improved as of 05/08/19       PT Long Term Goals - 05/24/19 0810      PT LONG TERM GOAL #1   Title  Patient will be independent with ongoing/advanced HEP    Status  Partially Met    Target Date  07/13/19      PT LONG TERM GOAL #2   Title  Patient to demonstrate appropriate posture and body mechanics needed for daily activities    Status  Achieved      PT LONG TERM GOAL #3   Title  Patient to report pain reduction in frequency and intensity by >/= 75%    Status  Revised    Target Date  07/13/19      PT LONG TERM GOAL #4   Title  Patient to improve cervical and B shoulder AROM to WFL/WNL without pain provocation    Status  Partially Met   05/22/19 - met for shoulder ROM   Target Date  07/13/19      PT LONG TERM GOAL #5   Title  Patient to report ability to sleep as well as perform ADLs and household tasks without pain interference    Status  Partially Met    Target Date  07/13/19            Plan - 06/05/19 0850    Clinical Impression Statement  Olin Hauser  reporting increased pain in both her L chest and R neck/shoulder/upper back today w/o known trigger as she did not go to her storage unit over the holiday weekend. She  did note some increased pain and UE radicular symptoms while having to manual wash her dishes for the last few days as her dishwasher was not working - reviewed posture and body mechanics for such tasks to promote more neutral spinal alignment. Introduced Engineer, maintenance to reinforce postural strengthening/stability with patient requiring cues to avoid shoulder shrug and emphasize scapular retraction and depression during pull-downs. Continued increased muscle tension and TPs present in R UT and L pecs today, although not as pronounced as last session prior to DN. Kinesiotape reapplied today at patient request as she continues to report increased pain w/o tape.    Personal Factors and Comorbidities  Comorbidity 3+;Time since onset of injury/illness/exacerbation;Past/Current Experience    Comorbidities  CAD s/p NSTEMI 10/30/16 & CABG x 4 01/10/18; HTN; cervicalgia; carpal tunnel syndrome s/p CTR 02/12/16; GERD; bronchitis    Examination-Activity Limitations  Carry;Lift;Reach Overhead;Sleep    Examination-Participation Restrictions  Laundry;Shop;Cleaning    Rehab Potential  Good    PT Frequency  2x / week    PT Duration  6 weeks   4-6 weeks   PT Treatment/Interventions  ADLs/Self Care Home Management;Cryotherapy;Electrical Stimulation;Iontophoresis 47m/ml Dexamethasone;Moist Heat;Ultrasound;Functional mobility training;Therapeutic activities;Therapeutic exercise;Neuromuscular re-education;Patient/family education;Manual techniques;Scar mobilization;Passive range of motion;Dry needling;Taping;Vasopneumatic Device;Spinal Manipulations;Joint Manipulations    PT Next Visit Plan  VS check PRN; postural stretching and strengthening; manual therapy including STM/MFR, DN &/or tapting for muscle spams/tightness; modalities PRN    PT Home Exercise Plan  04/02/19 - 3-way doorway pec stretch (high position deferred as of 04/10/19 - re-instated 06/05/19), reverse shoulder rolls, scap retraction, cervical retraction;  04/12/19 - UT, LS & SCM stretches, yellow (progressed to red as of 05/01/19) TB rows & retraction; 05/08/19 - UE brachial plexus nerve glide; 05/24/19 - hooklying scapular retraction exercises with red TB    Consulted and Agree with Plan of Care  Patient       Patient will benefit from skilled therapeutic intervention in order to improve the following deficits and impairments:  Cardiopulmonary status limiting activity, Decreased activity tolerance, Decreased mobility, Decreased range of motion, Decreased scar mobility, Decreased strength, Hypomobility, Increased fascial restricitons, Increased muscle spasms, Impaired perceived functional ability, Impaired flexibility, Impaired UE functional use, Improper body mechanics, Postural dysfunction, Pain  Visit Diagnosis: Other symptoms and signs involving the musculoskeletal system  Cervicalgia  Abnormal posture  Muscle weakness (generalized)     Problem List Patient Active Problem List   Diagnosis Date Noted  . Atypical chest pain 10/24/2018  . S/P CABG x 4 01/10/2018  . Hyperlipidemia 01/09/2018  . [place] 01/05/2018  . CAD (coronary artery disease), native coronary artery 12/28/2017  . Asymptomatic microscopic hematuria 11/14/2017  . Iron deficiency anemia 09/05/2017  . Cervicalgia 12/18/2015  . Benign essential HTN 04/08/2015  . Metatarsalgia of right foot 02/11/2015    JPercival Spanish PT, MPT 06/05/2019, 12:26 PM  [facility] [place] [address]Phone: [phone]  Fax:  [phone] Name: PCRISSY MCCREADIEMRN: [id number] of Birth: 806-Jul-1957

## 2019-06-07 DIAGNOSIS — I10 Essential (primary) hypertension: Secondary | ICD-10-CM | POA: Diagnosis not present

## 2019-06-07 DIAGNOSIS — E785 Hyperlipidemia, unspecified: Secondary | ICD-10-CM | POA: Diagnosis not present

## 2019-06-07 DIAGNOSIS — R1310 Dysphagia, unspecified: Secondary | ICD-10-CM | POA: Diagnosis not present

## 2019-06-07 DIAGNOSIS — D509 Iron deficiency anemia, unspecified: Secondary | ICD-10-CM | POA: Diagnosis not present

## 2019-06-07 DIAGNOSIS — M542 Cervicalgia: Secondary | ICD-10-CM | POA: Diagnosis not present

## 2019-06-07 DIAGNOSIS — M940 Chondrocostal junction syndrome [Tietze]: Secondary | ICD-10-CM | POA: Insufficient documentation

## 2019-06-07 DIAGNOSIS — R05 Cough: Secondary | ICD-10-CM | POA: Diagnosis not present

## 2019-06-08 ENCOUNTER — Encounter: Payer: Medicaid Other | Admitting: Physical Therapy

## 2019-06-12 ENCOUNTER — Ambulatory Visit: Payer: Medicaid Other | Admitting: Physical Therapy

## 2019-06-12 ENCOUNTER — Encounter: Payer: Self-pay | Admitting: Physical Therapy

## 2019-06-12 ENCOUNTER — Other Ambulatory Visit: Payer: Self-pay

## 2019-06-12 DIAGNOSIS — R29898 Other symptoms and signs involving the musculoskeletal system: Secondary | ICD-10-CM | POA: Diagnosis not present

## 2019-06-12 DIAGNOSIS — M542 Cervicalgia: Secondary | ICD-10-CM | POA: Diagnosis not present

## 2019-06-12 DIAGNOSIS — M6281 Muscle weakness (generalized): Secondary | ICD-10-CM

## 2019-06-12 DIAGNOSIS — R293 Abnormal posture: Secondary | ICD-10-CM | POA: Diagnosis not present

## 2019-06-12 NOTE — Therapy (Signed)
[facility] [address]  [place], [place], [address] Phone: [phone]   Fax:  [phone]  Physical Therapy Treatment  Patient Details  Name: Diana [name] MRN: [id number] Date of Birth: 12/05/1955 Referring Provider (PT): Jenne Campus, MD   Encounter Date: 06/12/2019  PT End of Session - 06/12/19 0803    Visit Number  14    Number of Visits  26    Date for PT Re-Evaluation  07/24/19    Authorization Type  Medicaid    Authorization Time Period  06/04/19 - 06/13/19    Authorization - Visit Number  2    Authorization - Number of Visits  3    PT Start Time  0803    PT Stop Time  0914    PT Time Calculation (min)  71 min    Activity Tolerance  Patient tolerated treatment well    Behavior During Therapy  [facility] for tasks assessed/performed       Past Medical History:  Diagnosis Date  . Allergy   . Arthritis    feet  . Bronchitis 10/30/2016  . Carpal tunnel syndrome   . Coronary artery disease   . GERD (gastroesophageal reflux disease)   . Hyperlipidemia   . Hypertension   . Non-ST elevation (NSTEMI) myocardial infarction ([place]) 10/30/2016  . NSTEMI (non-ST elevated myocardial infarction) ([place]) 10/30/2016    Past Surgical History:  Procedure Laterality Date  . CARPAL TUNNEL RELEASE Left 02/12/2016   Procedure: LEFT CARPAL TUNNEL RELEASE;  Surgeon: Daryll Brod, MD;  Location: [place];  Service: Orthopedics;  Laterality: Left;  FAB  . CORONARY ARTERY BYPASS GRAFT N/A 01/10/2018   Procedure: CORONARY ARTERY BYPASS GRAFTING (CABG) TIMES 4, USING LEFT INTERNAL MAMMARY ARTERY TO OM1  AND ENDOSCOPICALLY HARVESTED RIGHT SAPHENOUS VEIN TO DIAGONAL, AND SEQUENTIALLY TO OM2 AND DISTAL CIRCUMFLEX;  Surgeon: Grace Isaac, MD;  Location: [facility];  Service: Open Heart Surgery;  Laterality: N/A;  . LEFT HEART CATH AND CORONARY ANGIOGRAPHY N/A 01/05/2018   Procedure: LEFT HEART CATH AND CORONARY ANGIOGRAPHY;   Surgeon: Troy Sine, MD;  Location: [place] [facility];  Service: Cardiovascular;  Laterality: N/A;  . TEE WITHOUT CARDIOVERSION N/A 01/10/2018   Procedure: TRANSESOPHAGEAL ECHOCARDIOGRAM (TEE);  Surgeon: Grace Isaac, MD;  Location: [facility];  Service: Open Heart Surgery;  Laterality: N/A;  . TONSILLECTOMY    . TUBAL LIGATION    . WRIST SURGERY Right     There were no vitals filed for this visit.  Subjective Assessment - 06/12/19 0810    Subjective  Pt reporting her pain seems better this morning, but thinks it is because she has been resting.    Pertinent History  CABG x 4 - 01/10/18    Patient Stated Goals  "to have the pressure in my chest relieved"    Currently in Pain?  Yes    Pain Score  3     Pain Location  Chest    Pain Orientation  Left;Anterior;Medial    Pain Descriptors / Indicators  Sharp    Pain Type  Chronic pain    Pain Frequency  Intermittent    Pain Score  3    Pain Location  Shoulder    Pain Orientation  Right    Pain Descriptors / Indicators  Sore    Pain Type  Chronic pain    Pain Frequency  Intermittent  [facility] PT Assessment - 06/12/19 0803      Assessment   Medical Diagnosis  Atypical chest pain    Referring Provider (PT)  Jenne Campus, MD    Onset Date/Surgical Date  --   Fall 2019   Hand Dominance  Right    Next MD Visit  Feb 2021      AROM   Right Shoulder Flexion  155 Degrees    Right Shoulder ABduction  142 Degrees   pulling in chest   Right Shoulder Internal Rotation  --   FIR to L1 - sharp pain in chest   Right Shoulder External Rotation  --   FER to T3   Left Shoulder Flexion  150 Degrees    Left Shoulder ABduction  130 Degrees   pulling in chest   Left Shoulder Internal Rotation  --   FIR to L1 - sharp pain in chest   Left Shoulder External Rotation  --   FER to T3   Cervical Flexion  60    Cervical Extension  50 - "pulling" in chest    Cervical - Right Side Bend  32 - "pain/pulling" L upper shoulder     Cervical - Left Side Bend  28    Cervical - Right Rotation  70    Cervical - Left Rotation  62                   [facility] Adult PT Treatment/Exercise - 06/12/19 0803      Shoulder Exercises: ROM/Strengthening   UBE (Upper Arm Bike)  L2.5 x 6 min (3' fwd/3' back)      Modalities   Modalities  Electrical Stimulation;Moist Heat      Moist Heat Therapy   Number Minutes Moist Heat  15 Minutes    Moist Heat Location  Cervical;Shoulder   B periscapular region & anterior chest     Electrical Stimulation   Electrical Stimulation Location  B periscapular region    Electrical Stimulation Action  IFC    Electrical Stimulation Parameters  80-150 Hz, intensity to pt tolerance x 15'    Electrical Stimulation Goals  Pain;Tone      Manual Therapy   Manual Therapy  Soft tissue mobilization;Myofascial release;Passive ROM    Soft tissue mobilization  STM to L medial pec major/minor & sternocostal junction; Scar mobilization to sternal incision    Passive ROM  manual B pec stretch with scap retraction & depression    Kinesiotex  Inhibit Muscle;Create Space      Kinesiotix   Create Space  30% along sternum from xiphoid process to sternal notch + perpendicular strip at level of greatest tenderness    Inhibit Muscle   B UT & LS/thoracic paraspinals 30%                PT Short Term Goals - 05/11/19 0810      PT SHORT TERM GOAL #1   Title  Patient will be independent with initial HEP    Status  Achieved   04/16/19     PT SHORT TERM GOAL #2   Title  Patient to demonstrate understanding of good posture    Status  Achieved   04/16/2019     PT SHORT TERM GOAL #3   Title  Patient to report pain reduction in frequency and intensity by >/= 25%    Status  Achieved   30% improved as of 05/08/19       PT Long Term Goals -  06/12/19 0813      PT LONG TERM GOAL #1   Title  Patient will be independent with ongoing/advanced HEP    Status  Partially Met    Target Date  07/24/19       PT LONG TERM GOAL #2   Title  Patient to demonstrate appropriate posture and body mechanics needed for daily activities    Status  Achieved      PT LONG TERM GOAL #3   Title  Patient to report pain reduction in frequency and intensity by >/= 75%    Status  On-going    Target Date  07/24/19      PT LONG TERM GOAL #4   Title  Patient to improve cervical and B shoulder AROM to WFL/WNL without pain provocation    Status  Partially Met   05/22/19 - met for shoulder ROM   Target Date  07/24/19      PT LONG TERM GOAL #5   Title  Patient to report ability to sleep as well as perform ADLs and household tasks without pain interference    Status  Partially Met    Target Date  07/24/19            Plan - 06/12/19 0854    Clinical Impression Statement  Diana [name] reporting >50% improvement since start of PT with decreasing pain and increasing ease of neck and shoulder motions however still noting limitation with extremes of motion due to pain and pulling, mostly in chest along medial L pecs and sternocostal junction, and to a lesser extent in upper shoulders and scapular region. Pain and continued tightness limit her with overhead activities and reaching behind her back and also contribute to her increased anxiety. She notes benefit from stretches and exercise as well as taping but unable to achieve lasting relief thus far. Diana [name] will continue to benefit from skilled PT to further address ongoing pain and functional limitations to allow for return to normal activity w/o pain interference.    Personal Factors and Comorbidities  Comorbidity 3+;Time since onset of injury/illness/exacerbation;Past/Current Experience    Comorbidities  CAD s/p NSTEMI 10/30/16 & CABG x 4 01/10/18; HTN; cervicalgia; carpal tunnel syndrome s/p CTR 02/12/16; GERD; bronchitis    Examination-Activity Limitations  Carry;Lift;Reach Overhead;Sleep    Examination-Participation Restrictions  Laundry;Shop;Cleaning    Rehab Potential   Good    PT Frequency  2x / week    PT Duration  6 weeks   4-6 weeks   PT Treatment/Interventions  ADLs/Self Care Home Management;Cryotherapy;Electrical Stimulation;Iontophoresis 22m/ml Dexamethasone;Moist Heat;Ultrasound;Functional mobility training;Therapeutic activities;Therapeutic exercise;Neuromuscular re-education;Patient/family education;Manual techniques;Scar mobilization;Passive range of motion;Dry needling;Taping;Vasopneumatic Device;Spinal Manipulations;Joint Manipulations    PT Next Visit Plan  VS check PRN; postural stretching and strengthening; manual therapy including STM/MFR, DN &/or tapting for muscle spams/tightness; modalities PRN    PT Home Exercise Plan  04/02/19 - 3-way doorway pec stretch (high position deferred as of 04/10/19 - re-instated 06/05/19), reverse shoulder rolls, scap retraction, cervical retraction; 04/12/19 - UT, LS & SCM stretches, yellow (progressed to red as of 05/01/19) TB rows & retraction; 05/08/19 - UE brachial plexus nerve glide; 05/24/19 - hooklying scapular retraction exercises with red TB    Consulted and Agree with Plan of Care  Patient       Patient will benefit from skilled therapeutic intervention in order to improve the following deficits and impairments:  Cardiopulmonary status limiting activity, Decreased activity tolerance, Decreased mobility, Decreased range of motion, Decreased scar mobility, Decreased strength, Hypomobility, Increased  fascial restricitons, Increased muscle spasms, Impaired perceived functional ability, Impaired flexibility, Impaired UE functional use, Improper body mechanics, Postural dysfunction, Pain  Visit Diagnosis: Other symptoms and signs involving the musculoskeletal system  Cervicalgia  Abnormal posture  Muscle weakness (generalized)     Problem List Patient Active Problem List   Diagnosis Date Noted  . Atypical chest pain 10/24/2018  . S/P CABG x 4 01/10/2018  . Hyperlipidemia 01/09/2018  . Unstable angina  ([place]) 01/05/2018  . CAD (coronary artery disease), native coronary artery 12/28/2017  . Asymptomatic microscopic hematuria 11/14/2017  . Iron deficiency anemia 09/05/2017  . Cervicalgia 12/18/2015  . Benign essential HTN 04/08/2015  . Metatarsalgia of right foot 02/11/2015    Percival Spanish, PT, MPT 06/12/2019, 12:45 PM  [facility] [address]  [place], [place], [address] Phone: [phone]   Fax:  [phone]  Name: Diana [name] MRN: [id number] Date of Birth: 08/20/1955

## 2019-06-15 ENCOUNTER — Ambulatory Visit: Payer: Medicaid Other | Admitting: Physical Therapy

## 2019-06-15 ENCOUNTER — Other Ambulatory Visit: Payer: Self-pay

## 2019-06-15 ENCOUNTER — Encounter: Payer: Self-pay | Admitting: Physical Therapy

## 2019-06-15 DIAGNOSIS — M542 Cervicalgia: Secondary | ICD-10-CM

## 2019-06-15 DIAGNOSIS — R293 Abnormal posture: Secondary | ICD-10-CM

## 2019-06-15 DIAGNOSIS — R29898 Other symptoms and signs involving the musculoskeletal system: Secondary | ICD-10-CM | POA: Diagnosis not present

## 2019-06-15 DIAGNOSIS — M6281 Muscle weakness (generalized): Secondary | ICD-10-CM

## 2019-06-15 NOTE — Therapy (Signed)
[facility] [address]  [place] , [place], [address] Phone: [phone]   Fax:  [phone]  Physical Therapy Treatment  Patient Details  Name: Diana [name] MRN: [id number] Date of Birth: 04/04/1956 Referring Provider (PT): Jenne Campus, MD   Encounter Date: 06/15/2019  PT End of Session - 06/15/19 0848    Visit Number  15    Number of Visits  26    Date for PT Re-Evaluation  07/24/19    Authorization Type  Medicaid    Authorization Time Period  06/15/19 - 07/26/19    Authorization - Visit Number  1    Authorization - Number of Visits  12    PT Start Time  0848    PT Stop Time  0956    PT Time Calculation (min)  68 min    Activity Tolerance  Patient tolerated treatment well    Behavior During Therapy  [facility] for tasks assessed/performed       Past Medical History:  Diagnosis Date  . Allergy   . Arthritis    feet  . Bronchitis 10/30/2016  . Carpal tunnel syndrome   . Coronary artery disease   . GERD (gastroesophageal reflux disease)   . Hyperlipidemia   . Hypertension   . Non-ST elevation (NSTEMI) myocardial infarction ([place]) 10/30/2016  . NSTEMI (non-ST elevated myocardial infarction) ([place]) 10/30/2016    Past Surgical History:  Procedure Laterality Date  . CARPAL TUNNEL RELEASE Left 02/12/2016   Procedure: LEFT CARPAL TUNNEL RELEASE;  Surgeon: Daryll Brod, MD;  Location: [place];  Service: Orthopedics;  Laterality: Left;  FAB  . CORONARY ARTERY BYPASS GRAFT N/A 01/10/2018   Procedure: CORONARY ARTERY BYPASS GRAFTING (CABG) TIMES 4, USING LEFT INTERNAL MAMMARY ARTERY TO OM1  AND ENDOSCOPICALLY HARVESTED RIGHT SAPHENOUS VEIN TO DIAGONAL, AND SEQUENTIALLY TO OM2 AND DISTAL CIRCUMFLEX;  Surgeon: Grace Isaac, MD;  Location: [place];  Service: Open Heart Surgery;  Laterality: N/A;  . LEFT HEART CATH AND CORONARY ANGIOGRAPHY N/A 01/05/2018   Procedure: LEFT HEART CATH AND CORONARY ANGIOGRAPHY;   Surgeon: Troy Sine, MD;  Location: [place] [facility];  Service: Cardiovascular;  Laterality: N/A;  . TEE WITHOUT CARDIOVERSION N/A 01/10/2018   Procedure: TRANSESOPHAGEAL ECHOCARDIOGRAM (TEE);  Surgeon: Grace Isaac, MD;  Location: [facility];  Service: Open Heart Surgery;  Laterality: N/A;  . TONSILLECTOMY    . TUBAL LIGATION    . WRIST SURGERY Right     There were no vitals filed for this visit.  Subjective Assessment - 06/15/19 0851    Subjective  Pt reporting increased benefit from latest revision of taping patterns with essentially no pain reported this morning - very pleased. Was able to walk more w/o pain interference but still notes feeling of "heaviness" in anterior chest..    Pertinent History  CABG x 4 - 01/10/18    Patient Stated Goals  "to have the pressure in my chest relieved"    Currently in Pain?  Yes    Pain Score  0-No pain    Pain Location  Chest    Pain Orientation  Left;Anterior;Medial    Pain Descriptors / Indicators  Heaviness   no more sharp pain   Pain Type  Chronic pain    Pain Score  0    Pain Location  Shoulder    Pain Orientation  Right    Pain Type  Chronic pain  [facility] Adult PT Treatment/Exercise - 06/15/19 0848      Neck Exercises: Theraband   Shoulder External Rotation  10 reps;Red    Shoulder External Rotation Limitations  standing against pool noodle on wall - cues for scap retraction into noodle    Horizontal ABduction  10 reps;Red    Horizontal ABduction Limitations  standing against pool noodle on wall - cues for scap retraction into noodle    Other Theraband Exercises  Alt UE diagonals + scap retraction with red TB x 10; standing against pool noodle on wall - cues for scap retraction into noodle      Neck Exercises: Standing   Wall Push Ups  10 reps    Wall Push Ups Limitations  push-up plus - transitioned to green Pball d/t wrist discomfort      Shoulder Exercises: Standing   Row  Both;10  reps;Theraband;Strengthening    Theraband Level (Shoulder Row)  Level 3 (Green)    Row Limitations  cues to tuck elbows into ribs + scap retraction as well as cues to avoid shoulder shrug    Retraction  Both;10 reps;Theraband;Strengthening    Theraband Level (Shoulder Retraction)  Level 3 (Green)    Retraction Limitations  scap retraction/depression + mini-shoulder extension; cues to avoid shoulder hike      Shoulder Exercises: ROM/Strengthening   UBE (Upper Arm Bike)  L2.5 x 6 min (3' fwd/3' back)      Modalities   Modalities  Electrical Stimulation;Moist Heat      Moist Heat Therapy   Number Minutes Moist Heat  15 Minutes    Moist Heat Location  Cervical;Shoulder   B periscapular region & anterior chest     Electrical Stimulation   Electrical Stimulation Location  B periscapular region    Electrical Stimulation Action  IFC    Electrical Stimulation Parameters  80-150 Hz, intensity to pt tolerance x 15'    Electrical Stimulation Goals  Pain;Tone      Manual Therapy   Manual Therapy  Taping      Kinesiotix   Create Space  30% along sternum from xiphoid process to sternal notch - deferred perpendicular strip d/t increased skin sensitivity over breasts   black Rock Tape   Inhibit Muscle   B UT & LS/thoracic paraspinals 30%    black Rock Tape              PT Short Term Goals - 05/11/19 0810      PT SHORT TERM GOAL #1   Title  Patient will be independent with initial HEP    Status  Achieved   04/16/19     PT SHORT TERM GOAL #2   Title  Patient to demonstrate understanding of good posture    Status  Achieved   04/16/2019     PT SHORT TERM GOAL #3   Title  Patient to report pain reduction in frequency and intensity by >/= 25%    Status  Achieved   30% improved as of 05/08/19       PT Long Term Goals - 06/12/19 0813      PT LONG TERM GOAL #1   Title  Patient will be independent with ongoing/advanced HEP    Status  Partially Met    Target Date  07/24/19       PT LONG TERM GOAL #2   Title  Patient to demonstrate appropriate posture and body mechanics needed for daily activities    Status  Achieved  PT LONG TERM GOAL #3   Title  Patient to report pain reduction in frequency and intensity by >/= 75%    Status  On-going    Target Date  07/24/19      PT LONG TERM GOAL #4   Title  Patient to improve cervical and B shoulder AROM to WFL/WNL without pain provocation    Status  Partially Met   05/22/19 - met for shoulder ROM   Target Date  07/24/19      PT LONG TERM GOAL #5   Title  Patient to report ability to sleep as well as perform ADLs and household tasks without pain interference    Status  Partially Met    Target Date  07/24/19            Plan - 06/15/19 0936    Clinical Impression Statement  Diana [name] noting significant benefit from latest taping pattern with almost no pain this morning and able to increase her walking and exercise activity at home. Reviewed HEP and provided instruction in exercise progression for HEP with progression of hooklying scapular retraction to standing and increased resistance with rows to green TB with good tolerance. Continued progression of proximal upper body postural strengthening and stretching will all activities well tolerated. Rock tape reapplied with latest pattern w/o perpendicular sternal strip due to breast sensitivity given positive response from last application.    Personal Factors and Comorbidities  Comorbidity 3+;Time since onset of injury/illness/exacerbation;Past/Current Experience    Comorbidities  CAD s/p NSTEMI 10/30/16 & CABG x 4 01/10/18; HTN; cervicalgia; carpal tunnel syndrome s/p CTR 02/12/16; GERD; bronchitis    Examination-Activity Limitations  Carry;Lift;Reach Overhead;Sleep    Examination-Participation Restrictions  Laundry;Shop;Cleaning    Rehab Potential  Good    PT Frequency  2x / week    PT Duration  6 weeks   4-6 weeks   PT Treatment/Interventions  ADLs/Self Care Home  Management;Cryotherapy;Electrical Stimulation;Iontophoresis 90m/ml Dexamethasone;Moist Heat;Ultrasound;Functional mobility training;Therapeutic activities;Therapeutic exercise;Neuromuscular re-education;Patient/family education;Manual techniques;Scar mobilization;Passive range of motion;Dry needling;Taping;Vasopneumatic Device;Spinal Manipulations;Joint Manipulations    PT Next Visit Plan  VS check PRN; postural stretching and strengthening; manual therapy including STM/MFR, DN &/or taping for muscle spams/tightness; modalities PRN    PT Home Exercise Plan  04/02/19 - 3-way doorway pec stretch (high position deferred as of 04/10/19 - re-instated 06/05/19), reverse shoulder rolls, scap retraction, cervical retraction; 04/12/19 - UT, LS & SCM stretches, yellow (progressed to red as of 05/01/19 & green as of 06/15/19) TB rows & retraction; 05/08/19 - UE brachial plexus nerve glide; 05/24/19 - hooklying scapular retraction exercises with red TB    Consulted and Agree with Plan of Care  Patient       Patient will benefit from skilled therapeutic intervention in order to improve the following deficits and impairments:  Cardiopulmonary status limiting activity, Decreased activity tolerance, Decreased mobility, Decreased range of motion, Decreased scar mobility, Decreased strength, Hypomobility, Increased fascial restricitons, Increased muscle spasms, Impaired perceived functional ability, Impaired flexibility, Impaired UE functional use, Improper body mechanics, Postural dysfunction, Pain  Visit Diagnosis: Other symptoms and signs involving the musculoskeletal system  Cervicalgia  Abnormal posture  Muscle weakness (generalized)     Problem List Patient Active Problem List   Diagnosis Date Noted  . Atypical chest pain 10/24/2018  . S/P CABG x 4 01/10/2018  . Hyperlipidemia 01/09/2018  . Unstable angina ([place] 01/05/2018  . CAD (coronary artery disease), native coronary artery 12/28/2017  . Asymptomatic  microscopic hematuria 11/14/2017  . Iron deficiency  anemia 09/05/2017  . Cervicalgia 12/18/2015  . Benign essential HTN 04/08/2015  . Metatarsalgia of right foot 02/11/2015    Percival Spanish, PT, MPT 06/15/2019, 12:26 PM  [facility] [address]  [place], [place], [address] Phone: [phone]   Fax:  [phone]  Name: Diana [name] MRN: [id number] Date of Birth: 1955-11-05

## 2019-06-19 ENCOUNTER — Ambulatory Visit: Payer: Medicaid Other | Admitting: Physical Therapy

## 2019-06-20 ENCOUNTER — Ambulatory Visit: Payer: Medicaid Other | Admitting: Physical Therapy

## 2019-06-21 DIAGNOSIS — R0789 Other chest pain: Secondary | ICD-10-CM | POA: Diagnosis not present

## 2019-06-21 DIAGNOSIS — R1310 Dysphagia, unspecified: Secondary | ICD-10-CM | POA: Diagnosis not present

## 2019-06-21 DIAGNOSIS — G894 Chronic pain syndrome: Secondary | ICD-10-CM | POA: Diagnosis not present

## 2019-06-21 DIAGNOSIS — F4323 Adjustment disorder with mixed anxiety and depressed mood: Secondary | ICD-10-CM | POA: Diagnosis not present

## 2019-06-21 DIAGNOSIS — I25119 Atherosclerotic heart disease of native coronary artery with unspecified angina pectoris: Secondary | ICD-10-CM | POA: Diagnosis not present

## 2019-06-21 DIAGNOSIS — R05 Cough: Secondary | ICD-10-CM | POA: Diagnosis not present

## 2019-06-21 DIAGNOSIS — I10 Essential (primary) hypertension: Secondary | ICD-10-CM | POA: Diagnosis not present

## 2019-06-21 DIAGNOSIS — G8918 Other acute postprocedural pain: Secondary | ICD-10-CM | POA: Diagnosis not present

## 2019-06-22 ENCOUNTER — Ambulatory Visit: Payer: Medicaid Other | Admitting: Physical Therapy

## 2019-06-22 ENCOUNTER — Encounter: Payer: Self-pay | Admitting: Physical Therapy

## 2019-06-22 ENCOUNTER — Other Ambulatory Visit: Payer: Self-pay

## 2019-06-22 DIAGNOSIS — R293 Abnormal posture: Secondary | ICD-10-CM | POA: Diagnosis not present

## 2019-06-22 DIAGNOSIS — M542 Cervicalgia: Secondary | ICD-10-CM | POA: Diagnosis not present

## 2019-06-22 DIAGNOSIS — M6281 Muscle weakness (generalized): Secondary | ICD-10-CM

## 2019-06-22 DIAGNOSIS — R29898 Other symptoms and signs involving the musculoskeletal system: Secondary | ICD-10-CM

## 2019-06-22 NOTE — Therapy (Signed)
[facility] [address]  [place], [place], [address] Phone: [phone]   Fax:  [phone]  Physical Therapy Treatment  Patient Details  Name: Diana [name] MRN: [id number] Date of Birth: [birth year] Referring Provider (PT): Jenne Campus, MD   Encounter Date: 06/22/2019  PT End of Session - 06/22/19 0802    Visit Number  16    Number of Visits  26    Date for PT Re-Evaluation  07/24/19    Authorization Type  Medicaid    Authorization Time Period  06/15/19 - 07/26/19    Authorization - Visit Number  2    Authorization - Number of Visits  12    PT Start Time  0802    PT Stop Time  0901    PT Time Calculation (min)  59 min    Activity Tolerance  Patient tolerated treatment well    Behavior During Therapy  [facility] for tasks assessed/performed       Past Medical History:  Diagnosis Date  . Allergy   . Arthritis    feet  . Bronchitis 10/30/2016  . Carpal tunnel syndrome   . Coronary artery disease   . GERD (gastroesophageal reflux disease)   . Hyperlipidemia   . Hypertension   . Non-ST elevation (NSTEMI) myocardial infarction ([place]) 10/30/2016  . NSTEMI (non-ST elevated myocardial infarction) ([place]) 10/30/2016    Past Surgical History:  Procedure Laterality Date  . CARPAL TUNNEL RELEASE Left 02/12/2016   Procedure: LEFT CARPAL TUNNEL RELEASE;  Surgeon: Daryll Brod, MD;  Location: [place];  Service: Orthopedics;  Laterality: Left;  FAB  . CORONARY ARTERY BYPASS GRAFT N/A 01/10/2018   Procedure: CORONARY ARTERY BYPASS GRAFTING (CABG) TIMES 4, USING LEFT INTERNAL MAMMARY ARTERY TO OM1  AND ENDOSCOPICALLY HARVESTED RIGHT SAPHENOUS VEIN TO DIAGONAL, AND SEQUENTIALLY TO OM2 AND DISTAL CIRCUMFLEX;  Surgeon: Grace Isaac, MD;  Location: [place];  Service: Open Heart Surgery;  Laterality: N/A;  . LEFT HEART CATH AND CORONARY ANGIOGRAPHY N/A 01/05/2018   Procedure: LEFT HEART CATH AND CORONARY ANGIOGRAPHY;   Surgeon: Troy Sine, MD;  Location: [place] [facility];  Service: Cardiovascular;  Laterality: N/A;  . TEE WITHOUT CARDIOVERSION N/A 01/10/2018   Procedure: TRANSESOPHAGEAL ECHOCARDIOGRAM (TEE);  Surgeon: Grace Isaac, MD;  Location: [place];  Service: Open Heart Surgery;  Laterality: N/A;  . TONSILLECTOMY    . TUBAL LIGATION    . WRIST SURGERY Right     There were no vitals filed for this visit.  Subjective Assessment - 06/22/19 0806    Subjective  Pt still noting the difference when she doesn't have the tape on.    Pertinent History  CABG x 4 - 01/10/18    Patient Stated Goals  "to have the pressure in my chest relieved"    Currently in Pain?  Yes    Pain Score  2     Pain Location  Chest    Pain Orientation  Left;Anterior;Medial    Pain Descriptors / Indicators  --   "something sticking in"   Pain Type  Chronic pain    Pain Score  3    Pain Location  Scapula    Pain Orientation  Left    Pain Descriptors / Indicators  Cramping    Pain Type  Chronic pain    Pain Frequency  Intermittent  [facility] Adult PT Treatment/Exercise - 06/22/19 0802      Neck Exercises: Theraband   Shoulder External Rotation  10 reps;Red    Shoulder External Rotation Limitations  standing against door frame as tactile cue for scap retraction    Horizontal ABduction  10 reps;Red    Horizontal ABduction Limitations  standing against door frame as tactile cue for scap retraction    Other Theraband Exercises  Alt UE diagonals + scap retraction with red TB x 10; standing against door frame as tactile cue for scap retraction      Shoulder Exercises: ROM/Strengthening   UBE (Upper Arm Bike)  L2.5 x 6 min (3' fwd/3' back)      Shoulder Exercises: Stretch   Other Shoulder Stretches  B rhomboid stretch holding onto doorframe 2 x 30 sec      Manual Therapy   Manual Therapy  Soft tissue mobilization;Myofascial release;Scapular mobilization;Taping    Soft tissue  mobilization  STM/DTM to L LS, rhomboids, mid/lower trap & subscapularis    Myofascial Release  manual TPR to L LS, rhomboids, mid/lower trap & subscapularis      Kinesiotix   Create Space  30% along sternum from xiphoid process to sternal notch + perpendicular strip at level of greatest tenderness   blue argyle Rock Tape   Inhibit Muscle   B UT & LS/thoracic paraspinals 30%    blue argyle [employer] Dry Needling - 06/22/19 0802    Consent Given?  Yes    Muscles Treated Head and Neck  Levator scapulae    Muscles Treated Upper Quadrant  Rhomboids;Subscapularis    Levator Scapulae Response  Twitch response elicited;Palpable increased muscle length   Left   Rhomboids Response  Twitch response elicited;Palpable increased muscle length   Left   Subscapularis Response  Twitch response elicited;Palpable increased muscle length   Left          PT Education - 06/22/19 0859    Education Details  HEP progression - standing versions of scapular retraction (horiz ABD, ER & diagonals) with red TB, rhomboid stretch    Person(s) Educated  Patient    Methods  Explanation;Demonstration;Handout    Comprehension  Verbalized understanding;Returned demonstration;Need further instruction       PT Short Term Goals - 05/11/19 0810      PT SHORT TERM GOAL #1   Title  Patient will be independent with initial HEP    Status  Achieved   04/16/19     PT SHORT TERM GOAL #2   Title  Patient to demonstrate understanding of good posture    Status  Achieved   04/16/2019     PT SHORT TERM GOAL #3   Title  Patient to report pain reduction in frequency and intensity by >/= 25%    Status  Achieved   30% improved as of 05/08/19       PT Long Term Goals - 06/12/19 0813      PT LONG TERM GOAL #1   Title  Patient will be independent with ongoing/advanced HEP    Status  Partially Met    Target Date  07/24/19      PT LONG TERM GOAL #2   Title  Patient to demonstrate appropriate  posture and body mechanics needed for daily activities    Status  Achieved      PT LONG TERM GOAL #3   Title  Patient to report pain reduction in frequency and  intensity by >/= 75%    Status  On-going    Target Date  07/24/19      PT LONG TERM GOAL #4   Title  Patient to improve cervical and B shoulder AROM to WFL/WNL without pain provocation    Status  Partially Met   05/22/19 - met for shoulder ROM   Target Date  07/24/19      PT LONG TERM GOAL #5   Title  Patient to report ability to sleep as well as perform ADLs and household tasks without pain interference    Status  Partially Met    Target Date  07/24/19            Plan - 06/22/19 0810    Clinical Impression Statement  Monika reporting her overall pain continues to lessen but still notes increased pain in absence of kinesiotaping. Pain today more along medial L scapula with cramping noted in this area. Multiple taut bands and TPs identified in L LS, rhomboids, mid/lower trap and subscapularis which were addressed with manual therapy including DN with good twitch response elicited resulting in palpable reduction in muscle tension. Taping reapplied at patient request to further encourage muscle relaxation. Reviewed recent exercise progression as well as provided instruction in rhomboid stretch to promote further normalization of muscle tension, with update HEP handout provided.    Personal Factors and Comorbidities  Comorbidity 3+;Time since onset of injury/illness/exacerbation;Past/Current Experience    Comorbidities  CAD s/p NSTEMI 10/30/16 & CABG x 4 01/10/18; HTN; cervicalgia; carpal tunnel syndrome s/p CTR 02/12/16; GERD; bronchitis    Examination-Activity Limitations  Carry;Lift;Reach Overhead;Sleep    Examination-Participation Restrictions  Laundry;Shop;Cleaning    Rehab Potential  Good    PT Frequency  2x / week    PT Duration  6 weeks   4-6 weeks   PT Treatment/Interventions  ADLs/Self Care Home  Management;Cryotherapy;Electrical Stimulation;Iontophoresis 78m/ml Dexamethasone;Moist Heat;Ultrasound;Functional mobility training;Therapeutic activities;Therapeutic exercise;Neuromuscular re-education;Patient/family education;Manual techniques;Scar mobilization;Passive range of motion;Dry needling;Taping;Vasopneumatic Device;Spinal Manipulations;Joint Manipulations    PT Next Visit Plan  VS check PRN; postural stretching and strengthening; manual therapy including STM/MFR, DN &/or taping for muscle spams/tightness; modalities PRN    PT Home Exercise Plan  04/02/19 - 3-way doorway pec stretch (high position deferred as of 04/10/19 - re-instated 06/05/19), reverse shoulder rolls, scap retraction, cervical retraction; 04/12/19 - UT, LS & SCM stretches, yellow (progressed to red as of 05/01/19 & green as of 06/15/19) TB rows & retraction; 05/08/19 - UE brachial plexus nerve glide; 05/24/19 - hooklying scapular retraction exercises with red TB; 06/21/18 - standing versions of scapular retraction (horiz ABD, ER & diagonals) with red TB, rhomboid stretch    Consulted and Agree with Plan of Care  Patient       Patient will benefit from skilled therapeutic intervention in order to improve the following deficits and impairments:  Cardiopulmonary status limiting activity, Decreased activity tolerance, Decreased mobility, Decreased range of motion, Decreased scar mobility, Decreased strength, Hypomobility, Increased fascial restricitons, Increased muscle spasms, Impaired perceived functional ability, Impaired flexibility, Impaired UE functional use, Improper body mechanics, Postural dysfunction, Pain  Visit Diagnosis: Other symptoms and signs involving the musculoskeletal system  Cervicalgia  Abnormal posture  Muscle weakness (generalized)     Problem List Patient Active Problem List   Diagnosis Date Noted  . Atypical chest pain 10/24/2018  . S/P CABG x 4 01/10/2018  . Hyperlipidemia 01/09/2018  . Unstable  angina ([place] 01/05/2018  . CAD (coronary artery disease), native coronary artery 12/28/2017  .  Asymptomatic microscopic hematuria 11/14/2017  . Iron deficiency anemia 09/05/2017  . Cervicalgia 12/18/2015  . Benign essential HTN 04/08/2015  . Metatarsalgia of right foot 02/11/2015    Percival Spanish, PT, MPT 06/22/2019, 9:29 AM  [facility] [address]  [place], [place], [address] Phone: [phone]   Fax:  [phone]  Name: CURTISHA BENDIX MRN: [id number] Date of Birth: 1955-12-11

## 2019-06-22 NOTE — Patient Instructions (Signed)
    Home exercise program created by JoAnne Kreis, PT.  For questions, please contact JoAnne via phone at [phone] or email at [email or web address]  [facility] [address]  [address] [place], [place], [address] Phone: [phone]   Fax:  [phone]    

## 2019-06-26 ENCOUNTER — Other Ambulatory Visit: Payer: Self-pay

## 2019-06-26 ENCOUNTER — Ambulatory Visit: Payer: Medicaid Other | Admitting: Physical Therapy

## 2019-06-26 ENCOUNTER — Encounter: Payer: Self-pay | Admitting: Physical Therapy

## 2019-06-26 DIAGNOSIS — R29898 Other symptoms and signs involving the musculoskeletal system: Secondary | ICD-10-CM

## 2019-06-26 DIAGNOSIS — R293 Abnormal posture: Secondary | ICD-10-CM | POA: Diagnosis not present

## 2019-06-26 DIAGNOSIS — M542 Cervicalgia: Secondary | ICD-10-CM

## 2019-06-26 DIAGNOSIS — M6281 Muscle weakness (generalized): Secondary | ICD-10-CM | POA: Diagnosis not present

## 2019-06-26 NOTE — Therapy (Signed)
[facility] [place] [facility] [address]  [place], [place], [address] Phone: [phone]   Fax:  [phone]  Physical Therapy Treatment  Patient Details  Name: Diana [name] MRN: [id number] Date of Birth: 07-20-1955 Referring Provider (PT): Jenne Campus, MD   Encounter Date: 06/26/2019  PT End of Session - 06/26/19 0934    Visit Number  17    Number of Visits  26    Date for PT Re-Evaluation  07/24/19    Authorization Type  Medicaid    Authorization Time Period  06/15/19 - 07/26/19    Authorization - Visit Number  3    Authorization - Number of Visits  12    PT Start Time  0762    PT Stop Time  1018    PT Time Calculation (min)  44 min    Activity Tolerance  Patient tolerated treatment well    Behavior During Therapy  [facility] for tasks assessed/performed       Past Medical History:  Diagnosis Date  . Allergy   . Arthritis    feet  . Bronchitis 10/30/2016  . Carpal tunnel syndrome   . Coronary artery disease   . GERD (gastroesophageal reflux disease)   . Hyperlipidemia   . Hypertension   . Non-ST elevation (NSTEMI) myocardial infarction ([place]) 10/30/2016  . NSTEMI (non-ST elevated myocardial infarction) ([place]) 10/30/2016    Past Surgical History:  Procedure Laterality Date  . CARPAL TUNNEL RELEASE Left 02/12/2016   Procedure: LEFT CARPAL TUNNEL RELEASE;  Surgeon: Daryll Brod, MD;  Location: [facility];  Service: Orthopedics;  Laterality: Left;  FAB  . CORONARY ARTERY BYPASS GRAFT N/A 01/10/2018   Procedure: CORONARY ARTERY BYPASS GRAFTING (CABG) TIMES 4, USING LEFT INTERNAL MAMMARY ARTERY TO OM1  AND ENDOSCOPICALLY HARVESTED RIGHT SAPHENOUS VEIN TO DIAGONAL, AND SEQUENTIALLY TO OM2 AND DISTAL CIRCUMFLEX;  Surgeon: Grace Isaac, MD;  Location: [facility];  Service: Open Heart Surgery;  Laterality: N/A;  . LEFT HEART CATH AND CORONARY ANGIOGRAPHY N/A 01/05/2018   Procedure: LEFT HEART CATH AND CORONARY ANGIOGRAPHY;   Surgeon: Troy Sine, MD;  Location: [facility];  Service: Cardiovascular;  Laterality: N/A;  . TEE WITHOUT CARDIOVERSION N/A 01/10/2018   Procedure: TRANSESOPHAGEAL ECHOCARDIOGRAM (TEE);  Surgeon: Grace Isaac, MD;  Location: [facility];  Service: Open Heart Surgery;  Laterality: N/A;  . TONSILLECTOMY    . TUBAL LIGATION    . WRIST SURGERY Right     There were no vitals filed for this visit.  Subjective Assessment - 06/26/19 0937    Subjective  Pt reporting not so much pain today as "heaviness" in the chest and no pain in neck, shoulder or scapular area. Feels like manual therapy,DN and taping last visit helped.    Pertinent History  CABG x 4 - 01/10/18    Patient Stated Goals  "to have the pressure in my chest relieved"    Currently in Pain?  Yes    Pain Score  0-No pain    Pain Location  Chest    Pain Orientation  Left;Anterior;Medial    Pain Descriptors / Indicators  Heaviness    Multiple Pain Sites  No                       [facility] Adult PT Treatment/Exercise - 06/26/19 0934      Neck Exercises: Standing   Wall Push Ups  10 reps  Wall Push Ups Limitations  push-up plus on green Pball    Upper Extremity D1  10 reps;Extension;Theraband    Theraband Level (UE D1)  Level 1 (Yellow)    Upper Extremity D2  10 reps;Extension;Theraband    Theraband Level (UE D2)  Level 1 (Yellow)      Shoulder Exercises: Prone   Extension  Both;AROM;Strengthening;10 reps    Extension Limitations  I's - leaning over green Pball on floor    Horizontal ABduction 1  Both;AROM;Strengthening   7 reps - stopped d/t increased pain   Horizontal ABduction 1 Limitations  T's - leaning over green Pball on floor      Shoulder Exercises: ROM/Strengthening   UBE (Upper Arm Bike)  L2.5 x 6 min (3' fwd/3' back)      Manual Therapy   Manual Therapy  Taping      Kinesiotix   Create Space  30% along sternum from xiphoid process to sternal notch + perpendicular strip at level of  greatest tenderness   blue argyle Rock Tape with blue KinesioTex fro cross strip   Inhibit Muscle   B UT & LS/thoracic paraspinals 30%    blue argyle Rock Tape     Neck Exercises: Stretches   Chest Stretch  30 seconds;3 reps    Chest Stretch Limitations  seated walk-out with back arched over green Pball                PT Short Term Goals - 05/11/19 0810      PT SHORT TERM GOAL #1   Title  Patient will be independent with initial HEP    Status  Achieved   04/16/19     PT SHORT TERM GOAL #2   Title  Patient to demonstrate understanding of good posture    Status  Achieved   04/16/2019     PT SHORT TERM GOAL #3   Title  Patient to report pain reduction in frequency and intensity by >/= 25%    Status  Achieved   30% improved as of 05/08/19       PT Long Term Goals - 06/12/19 0813      PT LONG TERM GOAL #1   Title  Patient will be independent with ongoing/advanced HEP    Status  Partially Met    Target Date  07/24/19      PT LONG TERM GOAL #2   Title  Patient to demonstrate appropriate posture and body mechanics needed for daily activities    Status  Achieved      PT LONG TERM GOAL #3   Title  Patient to report pain reduction in frequency and intensity by >/= 75%    Status  On-going    Target Date  07/24/19      PT LONG TERM GOAL #4   Title  Patient to improve cervical and B shoulder AROM to WFL/WNL without pain provocation    Status  Partially Met   05/22/19 - met for shoulder ROM   Target Date  07/24/19      PT LONG TERM GOAL #5   Title  Patient to report ability to sleep as well as perform ADLs and household tasks without pain interference    Status  Partially Met    Target Date  07/24/19            Plan - 06/26/19 0936    Clinical Impression Statement  Diana [name] reporting good pain relief from manual therapy, DN and taping last  visit, noting only some heaviness in the anterior chest today. Able to progress scapular strengthening to prone over Pball  but limited tolerance with T's d/t increased pain related to fatigue. Fatigue also limiting progression of UE diagonals to PNF patterns. Diana [name] will continue to benefit from skilled PT for further postural strengthening to minimize or eliminate fatigue related muscle soreness/pain. In the meantime taping reapplied at patient request given ongoing benefit noted.    Personal Factors and Comorbidities  Comorbidity 3+;Time since onset of injury/illness/exacerbation;Past/Current Experience    Comorbidities  CAD s/p NSTEMI 10/30/16 & CABG x 4 01/10/18; HTN; cervicalgia; carpal tunnel syndrome s/p CTR 02/12/16; GERD; bronchitis    Examination-Activity Limitations  Carry;Lift;Reach Overhead;Sleep    Examination-Participation Restrictions  Laundry;Shop;Cleaning    Rehab Potential  Good    PT Frequency  2x / week    PT Duration  6 weeks   4-6 weeks   PT Treatment/Interventions  ADLs/Self Care Home Management;Cryotherapy;Electrical Stimulation;Iontophoresis 8m/ml Dexamethasone;Moist Heat;Ultrasound;Functional mobility training;Therapeutic activities;Therapeutic exercise;Neuromuscular re-education;Patient/family education;Manual techniques;Scar mobilization;Passive range of motion;Dry needling;Taping;Vasopneumatic Device;Spinal Manipulations;Joint Manipulations    PT Next Visit Plan  VS check PRN; postural stretching and strengthening; manual therapy including STM/MFR, DN &/or taping for muscle spams/tightness; modalities PRN    PT Home Exercise Plan  04/02/19 - 3-way doorway pec stretch (high position deferred as of 04/10/19 - re-instated 06/05/19), reverse shoulder rolls, scap retraction, cervical retraction; 04/12/19 - UT, LS & SCM stretches, yellow (progressed to red as of 05/01/19 & green as of 06/15/19) TB rows & retraction; 05/08/19 - UE brachial plexus nerve glide; 05/24/19 - hooklying scapular retraction exercises with red TB; 06/21/18 - standing versions of scapular retraction (horiz ABD, ER & diagonals) with red TB,  rhomboid stretch    Consulted and Agree with Plan of Care  Patient       Patient will benefit from skilled therapeutic intervention in order to improve the following deficits and impairments:  Cardiopulmonary status limiting activity, Decreased activity tolerance, Decreased mobility, Decreased range of motion, Decreased scar mobility, Decreased strength, Hypomobility, Increased fascial restricitons, Increased muscle spasms, Impaired perceived functional ability, Impaired flexibility, Impaired UE functional use, Improper body mechanics, Postural dysfunction, Pain  Visit Diagnosis: Other symptoms and signs involving the musculoskeletal system  Cervicalgia  Abnormal posture  Muscle weakness (generalized)     Problem List Patient Active Problem List   Diagnosis Date Noted  . Atypical chest pain 10/24/2018  . S/P CABG x 4 01/10/2018  . Hyperlipidemia 01/09/2018  . [place] 01/05[place]2018  . CAD (coronary artery disease), native coronary artery 12/28/2017  . Asymptomatic microscopic hematuria 11/14/2017  . Iron deficiency anemia 09/05/2017  . Cervicalgia 12/18/2015  . Benign essential HTN 04/08/2015  . Metatarsalgia of right foot 02/11/2015    JPercival Spanish PT, MPT 06/26/2019, 11:46 AM  [facility] [place] [address]Phone: [phone]  Fax:  [phone] Name: Diana CAPUTOMRN: [id number] of Birth: 8July 21, 1957

## 2019-06-29 ENCOUNTER — Ambulatory Visit: Payer: Medicaid Other | Admitting: Physical Therapy

## 2019-06-29 ENCOUNTER — Other Ambulatory Visit: Payer: Self-pay

## 2019-06-29 ENCOUNTER — Encounter: Payer: Self-pay | Admitting: Physical Therapy

## 2019-06-29 DIAGNOSIS — M6281 Muscle weakness (generalized): Secondary | ICD-10-CM | POA: Diagnosis not present

## 2019-06-29 DIAGNOSIS — R293 Abnormal posture: Secondary | ICD-10-CM | POA: Diagnosis not present

## 2019-06-29 DIAGNOSIS — M542 Cervicalgia: Secondary | ICD-10-CM | POA: Diagnosis not present

## 2019-06-29 DIAGNOSIS — R29898 Other symptoms and signs involving the musculoskeletal system: Secondary | ICD-10-CM

## 2019-06-29 NOTE — Therapy (Signed)
[facility] [address]  [place], [place], [address] Phone: [phone]   Fax:  [phone]  Physical Therapy Treatment  Patient Details  Name: Diana [name] MRN: [id number] Date of Birth: [birth year] Referring Provider (PT): Jenne Campus, MD   Encounter Date: 06/29/2019  PT End of Session - 06/29/19 0806    Visit Number  18    Number of Visits  26    Date for PT Re-Evaluation  07/24/19    Authorization Type  Medicaid    Authorization Time Period  06/15/19 - 07/26/19    Authorization - Visit Number  4    Authorization - Number of Visits  12    PT Start Time  0806    PT Stop Time  0859    PT Time Calculation (min)  53 min    Activity Tolerance  Patient tolerated treatment [name]    Behavior During Therapy  [facility] for tasks assessed/performed       Past Medical History:  Diagnosis Date  . Allergy   . Arthritis    feet  . Bronchitis 10/30/2016  . Carpal tunnel syndrome   . Coronary artery disease   . GERD (gastroesophageal reflux disease)   . Hyperlipidemia   . Hypertension   . Non-ST elevation (NSTEMI) myocardial infarction ([place]) 10/30/2016  . NSTEMI (non-ST elevated myocardial infarction) ([place]) 10/30/2016    Past Surgical History:  Procedure Laterality Date  . CARPAL TUNNEL RELEASE Left 02/12/2016   Procedure: LEFT CARPAL TUNNEL RELEASE;  Surgeon: Daryll Brod, MD;  Location: [facility];  Service: Orthopedics;  Laterality: Left;  FAB  . CORONARY ARTERY BYPASS GRAFT N/A 01/10/2018   Procedure: CORONARY ARTERY BYPASS GRAFTING (CABG) TIMES 4, USING LEFT INTERNAL MAMMARY ARTERY TO OM1  AND ENDOSCOPICALLY HARVESTED RIGHT SAPHENOUS VEIN TO DIAGONAL, AND SEQUENTIALLY TO OM2 AND DISTAL CIRCUMFLEX;  Surgeon: Grace Isaac, MD;  Location: [facility];  Service: Open Heart Surgery;  Laterality: N/A;  . LEFT HEART CATH AND CORONARY ANGIOGRAPHY N/A 01/05/2018   Procedure: LEFT HEART CATH AND CORONARY ANGIOGRAPHY;   Surgeon: Troy Sine, MD;  Location: [facility];  Service: Cardiovascular;  Laterality: N/A;  . TEE WITHOUT CARDIOVERSION N/A 01/10/2018   Procedure: TRANSESOPHAGEAL ECHOCARDIOGRAM (TEE);  Surgeon: Grace Isaac, MD;  Location: [facility];  Service: Open Heart Surgery;  Laterality: N/A;  . TONSILLECTOMY    . TUBAL LIGATION    . WRIST SURGERY Right     There were no vitals filed for this visit.  Subjective Assessment - 06/29/19 0808    Subjective  Pt reporting she "kept catching a cramp" in her chest last night but no shoulder or scapular pain today.    Pertinent History  CABG x 4 - 01/10/18    Patient Stated Goals  "to have the pressure in my chest relieved"    Currently in Pain?  Yes    Pain Score  2     Pain Location  Chest    Pain Orientation  Left;Anterior;Medial    Pain Descriptors / Indicators  Heaviness    Pain Type  Chronic pain    Pain Frequency  Intermittent    Multiple Pain Sites  No    Pain Score  0    Pain Location  Shoulder                       [facility] Adult PT Treatment/Exercise - 06/29/19  [id number]      Neck Exercises: Standing   Upper Extremity D1  10 reps;Flexion;Extension;Theraband    Theraband Level (UE D1)  Level 1 (Yellow)    Upper Extremity D2  10 reps;Flexion;Extension;Theraband    Theraband Level (UE D2)  Level 1 (Yellow)      Shoulder Exercises: ROM/Strengthening   UBE (Upper Arm Bike)  L2.5 x 6 min (3' fwd/3' back)      Shoulder Exercises: IT sales professional  30 seconds;2 reps   Advertising copywriter Limitations  3-way doorway pec stretch - cues for proper alignment and increased hold time      Hand Exercises for Cervical Radiculopathy   Other Hand Exercise for Cervical Radiculopathy  L radial nerve glide x 10      Modalities   Modalities  Iontophoresis      Iontophoresis   Type of Iontophoresis  Dexamethasone    Location  L mid sternocostal junction    Dose  80 mA-min; 1.0 mL    Time  4-6 hr patch (#1 of 6)       Manual Therapy   Manual Therapy  Taping      Kinesiotix   Inhibit Muscle   B UT & LS/thoracic paraspinals 30%    blue KinesioTex tape            PT Education - 06/29/19 0859    Education Details  HEP update - radial nerve glide, UE PNF patterns with yellow TB; Ionto patch instructions and precautions    Person(s) Educated  Patient    Methods  Explanation;Demonstration;Verbal cues;Handout    Comprehension  Verbalized understanding;Returned demonstration;Verbal cues required;Need further instruction       PT Short Term Goals - 05/11/19 0810      PT SHORT TERM GOAL #1   Title  Patient will be independent with initial HEP    Status  Achieved   04/16/19     PT SHORT TERM GOAL #2   Title  Patient to demonstrate understanding of good posture    Status  Achieved   04/16/2019     PT SHORT TERM GOAL #3   Title  Patient to report pain reduction in frequency and intensity by >/= 25%    Status  Achieved   30% improved as of 05/08/19       PT Long Term Goals - 06/29/19 0859      PT LONG TERM GOAL #1   Title  Patient will be independent with ongoing/advanced HEP    Status  Partially Met    Target Date  07/24/19      PT LONG TERM GOAL #2   Title  Patient to demonstrate appropriate posture and body mechanics needed for daily activities    Status  Achieved      PT LONG TERM GOAL #3   Title  Patient to report pain reduction in frequency and intensity by >/= 75%    Status  Partially Met    Target Date  07/24/19      PT LONG TERM GOAL #4   Title  Patient to improve cervical and B shoulder AROM to WFL/WNL without pain provocation    Status  Partially Met   05/22/19 - met for shoulder ROM   Target Date  07/24/19      PT LONG TERM GOAL #5   Title  Patient to report ability to sleep as [name] as perform ADLs and household tasks without pain interference  Status  Partially Met    Target Date  07/24/19            Plan - 06/29/19 0859    Clinical Impression  Statement  Diana [name] reporting she remains pain free in neck and shoulder area today but still experiencing low level sternocostal pain on L - LTG #3 now partially met for neck and shoulders. Reviewed 3-way doorway stretch with patient reporting mid-level stretch best targeting area of continued pain. She demonstrated better tolerance for UE PNF diagonals today and was able to complete both D1 & D2 patterns for both flexion and extension on L, although still reporting some L UE fatigue. She also notes that when she leans on L arm at home, she will occasionally feel like her arm is cramping in a pattern consistent with the radial nerve, therefore instructed her in L radial nerve glides. HEP updated to include nerve glides and PNF patterns at patient request. Given ongoing sternocostal pain/irritation, initiated trial of iontophoresis patch in place of taping today (taping still applied to posterior neck/shoulders at patient request) and will assess response next visit. Diana [name] with overall pain levels continuing to decline with improving activity tolerance with decreased pain interference. She will continue from skilled PT to address remaining deficits.    Personal Factors and Comorbidities  Comorbidity 3+;Time since onset of injury/illness/exacerbation;Past/Current Experience    Comorbidities  CAD s/p NSTEMI 10/30/16 & CABG x 4 01/10/18; HTN; cervicalgia; carpal tunnel syndrome s/p CTR 02/12/16; GERD; bronchitis    Examination-Activity Limitations  Carry;Lift;Reach Overhead;Sleep    Examination-Participation Restrictions  Laundry;Shop;Cleaning    Rehab Potential  Good    PT Frequency  2x / week    PT Duration  6 weeks   4-6 weeks   PT Treatment/Interventions  ADLs/Self Care Home Management;Cryotherapy;Electrical Stimulation;Iontophoresis 23m/ml Dexamethasone;Moist Heat;Ultrasound;Functional mobility training;Therapeutic activities;Therapeutic exercise;Neuromuscular re-education;Patient/family  education;Manual techniques;Scar mobilization;Passive range of motion;Dry needling;Taping;Vasopneumatic Device;Spinal Manipulations;Joint Manipulations    PT Next Visit Plan  VS check PRN; postural stretching and strengthening; manual therapy including STM/MFR, DN &/or taping for muscle spams/tightness; modalities PRN    PT Home Exercise Plan  04/02/19 - 3-way doorway pec stretch (high position deferred as of 04/10/19 - re-instated 06/05/19), reverse shoulder rolls, scap retraction, cervical retraction; 04/12/19 - UT, LS & SCM stretches, yellow (progressed to red as of 05/01/19 & green as of 06/15/19) TB rows & retraction; 05/08/19 - UE brachial plexus nerve glide; 05/24/19 - hooklying scapular retraction exercises with red TB; 06/22/19 - standing versions of scapular retraction (horiz ABD, ER & diagonals) with red TB, rhomboid stretch; 06/29/19 - radial nerve glide, UE PNF patterns with yellow TB    Consulted and Agree with Plan of Care  Patient       Patient will benefit from skilled therapeutic intervention in order to improve the following deficits and impairments:  Cardiopulmonary status limiting activity, Decreased activity tolerance, Decreased mobility, Decreased range of motion, Decreased scar mobility, Decreased strength, Hypomobility, Increased fascial restricitons, Increased muscle spasms, Impaired perceived functional ability, Impaired flexibility, Impaired UE functional use, Improper body mechanics, Postural dysfunction, Pain  Visit Diagnosis: Other symptoms and signs involving the musculoskeletal system  Cervicalgia  Abnormal posture  Muscle weakness (generalized)     Problem List Patient Active Problem List   Diagnosis Date Noted  . Atypical chest pain 10/24/2018  . S/P CABG x 4 01/10/2018  . Hyperlipidemia 01/09/2018  . Unstable angina ([place] 01/05/2018  . CAD (coronary artery disease), native coronary artery 12/28/2017  .  Asymptomatic microscopic hematuria 11/14/2017  . Iron  deficiency anemia 09/05/2017  . Cervicalgia 12/18/2015  . Benign essential HTN 04/08/2015  . Metatarsalgia of right foot 02/11/2015    Percival Spanish, PT, MPT 06/29/2019, 9:28 AM  [facility] [address]  [place], [place], [address] Phone: [phone]   Fax:  [phone]  Name: Diana [name] MRN: [id number] Date of Birth: July 24, 1955

## 2019-06-29 NOTE — Patient Instructions (Signed)
  Home exercise program created by Dayan Desa, PT.  For questions, please contact Chrislyn Seedorf via phone at [phone] or email at [email or web address]  [facility] [address]  [address] [place], [place], [address] Phone: [phone]   Fax:  [phone]      IONTOPHORESIS PATIENT PRECAUTIONS & CONTRAINDICATIONS:  . Redness under one or both electrodes can occur.  This characterized by a uniform redness that usually disappears within 12 hours of treatment. . Small pinhead size blisters may result in response to the drug.  Contact your physician if the problem persists more than 24 hours. . On rare occasions, iontophoresis therapy can result in temporary skin reactions such as rash, inflammation, irritation or burns.  The skin reactions may be the result of individual sensitivity to the ionic solution used, the condition of the skin at the start of treatment, reaction to the materials in the electrodes, allergies or sensitivity to dexamethasone, or a poor connection between the patch and your skin.  Discontinue using iontophoresis if you have any of these reactions and report to your therapist. . Remove the Patch or electrodes if you have any undue sensation of pain or burning during the treatment and report discomfort to your therapist. . Tell your Therapist if you have had known adverse reactions to the application of electrical current. . Approximate treatment time is 4-6 hours.  Remove the patch after 6 hours. . The Patch can be worn during normal activity, however excessive motion where the electrodes have been placed can cause poor contact between the skin and the electrode or uneven electrical current resulting in greater risk of skin irritation. . Keep out of the reach of children.   . DO NOT use if you have a cardiac pacemaker or any other electrically sensitive implanted device. . DO NOT use if you have a known  sensitivity to dexamethasone. . DO NOT use during Magnetic Resonance Imaging (MRI). . DO NOT use over broken or compromised skin (e.g. sunburn, cuts, or acne) due to the increased risk of skin reaction. . DO NOT SHAVE over the area to be treated:  To establish good contact between the Patch and the skin, excessive hair may be clipped. . DO NOT place the Patch or electrodes on or over your eyes, directly over your heart, or brain. . DO NOT reuse the Patch or electrodes as this may cause burns to occur.   For questions, please contact your therapist at:  [facility] [place] [address]  [address] [place], [place], [address] Phone: [phone]   Fax:  [phone]  

## 2019-07-03 ENCOUNTER — Ambulatory Visit: Payer: Medicaid Other | Admitting: Physical Therapy

## 2019-07-05 DIAGNOSIS — I10 Essential (primary) hypertension: Secondary | ICD-10-CM | POA: Diagnosis not present

## 2019-07-05 DIAGNOSIS — E875 Hyperkalemia: Secondary | ICD-10-CM | POA: Diagnosis not present

## 2019-07-05 DIAGNOSIS — M79672 Pain in left foot: Secondary | ICD-10-CM | POA: Diagnosis not present

## 2019-07-06 ENCOUNTER — Other Ambulatory Visit: Payer: Self-pay

## 2019-07-06 ENCOUNTER — Ambulatory Visit: Payer: Medicaid Other | Attending: Cardiology | Admitting: Physical Therapy

## 2019-07-06 ENCOUNTER — Encounter: Payer: Self-pay | Admitting: Physical Therapy

## 2019-07-06 DIAGNOSIS — M6281 Muscle weakness (generalized): Secondary | ICD-10-CM | POA: Insufficient documentation

## 2019-07-06 DIAGNOSIS — R293 Abnormal posture: Secondary | ICD-10-CM

## 2019-07-06 DIAGNOSIS — M542 Cervicalgia: Secondary | ICD-10-CM | POA: Insufficient documentation

## 2019-07-06 DIAGNOSIS — R29898 Other symptoms and signs involving the musculoskeletal system: Secondary | ICD-10-CM

## 2019-07-06 NOTE — Therapy (Signed)
[facility] [place] [address]  [place], [place], [address] Phone: [phone]   Fax:  [phone]  Physical Therapy Treatment  Patient Details  Name: Diana [name] MRN: [id number] Date of Birth: 16-Oct-1955 Referring Provider (PT): Jenne Campus, MD   Encounter Date: 07/06/2019  PT End of Session - 07/06/19 0801    Visit Number  19    Number of Visits  26    Date for PT Re-Evaluation  07/24/19    Authorization Type  Medicaid    Authorization Time Period  06/15/19 - 07/26/19    Authorization - Visit Number  5    Authorization - Number of Visits  12    PT Start Time  0801    PT Stop Time  0848    PT Time Calculation (min)  47 min    Activity Tolerance  Patient tolerated treatment well    Behavior During Therapy  [facility] for tasks assessed/performed       Past Medical History:  Diagnosis Date  . Allergy   . Arthritis    feet  . Bronchitis 10/30/2016  . Carpal tunnel syndrome   . Coronary artery disease   . GERD (gastroesophageal reflux disease)   . Hyperlipidemia   . Hypertension   . Non-ST elevation (NSTEMI) myocardial infarction ([place]) 10/30/2016  . NSTEMI (non-ST elevated myocardial infarction) ([place]) 10/30/2016    Past Surgical History:  Procedure Laterality Date  . CARPAL TUNNEL RELEASE Left 02/12/2016   Procedure: LEFT CARPAL TUNNEL RELEASE;  Surgeon: Daryll Brod, MD;  Location: [place];  Service: Orthopedics;  Laterality: Left;  FAB  . CORONARY ARTERY BYPASS GRAFT N/A 01/10/2018   Procedure: CORONARY ARTERY BYPASS GRAFTING (CABG) TIMES 4, USING LEFT INTERNAL MAMMARY ARTERY TO OM1  AND ENDOSCOPICALLY HARVESTED RIGHT SAPHENOUS VEIN TO DIAGONAL, AND SEQUENTIALLY TO OM2 AND DISTAL CIRCUMFLEX;  Surgeon: Grace Isaac, MD;  Location: [place];  Service: Open Heart Surgery;  Laterality: N/A;  . LEFT HEART CATH AND CORONARY ANGIOGRAPHY N/A 01/05/2018   Procedure: LEFT HEART CATH AND CORONARY ANGIOGRAPHY;   Surgeon: Troy Sine, MD;  Location: [place] [facility];  Service: Cardiovascular;  Laterality: N/A;  . TEE WITHOUT CARDIOVERSION N/A 01/10/2018   Procedure: TRANSESOPHAGEAL ECHOCARDIOGRAM (TEE);  Surgeon: Grace Isaac, MD;  Location: [place];  Service: Open Heart Surgery;  Laterality: N/A;  . TONSILLECTOMY    . TUBAL LIGATION    . WRIST SURGERY Right     There were no vitals filed for this visit.  Subjective Assessment - 07/06/19 0804    Subjective  Pt reporting a big difference with the ionto patch last visit. States her MD took her off the muscle relaxant because it was giving her headaches.    Pertinent History  CABG x 4 - 01/10/18    Patient Stated Goals  "to have the pressure in my chest relieved"    Currently in Pain?  No/denies                       [facility] Adult PT Treatment/Exercise - 07/06/19 0801      Neck Exercises: Machines for Strengthening   Cybex Row  15# x 8 - cues for scap retraction and abd bracing    Cybex Chest Press  5# x 7 - cues to stop reverse motion at point of slight chest/pec stretch    Lat Pull  10# x 4   limited  d/t c/o shoulder pain     Neck Exercises: Standing   Upper Extremity D1  10 reps;Flexion;Extension;Theraband    Theraband Level (UE D1)  Level 1 (Yellow)    Upper Extremity D2  10 reps;Flexion;Extension;Theraband    Theraband Level (UE D2)  Level 1 (Yellow)      Shoulder Exercises: ROM/Strengthening   UBE (Upper Arm Bike)  L2.5 x 6 min (3' fwd/3' back)      Modalities   Modalities  Iontophoresis      Iontophoresis   Type of Iontophoresis  Dexamethasone    Location  L mid sternocostal junction    Dose  80 mA-min; 1.0 mL    Time  4-6 hr patch (#2 of 6)      Manual Therapy   Manual Therapy  Taping      Kinesiotix   Inhibit Muscle   B UT 30%    blue KinesioTex tape              PT Short Term Goals - 05/11/19 0810      PT SHORT TERM GOAL #1   Title  Patient will be independent with initial HEP     Status  Achieved   04/16/19     PT SHORT TERM GOAL #2   Title  Patient to demonstrate understanding of good posture    Status  Achieved   04/16/2019     PT SHORT TERM GOAL #3   Title  Patient to report pain reduction in frequency and intensity by >/= 25%    Status  Achieved   30% improved as of 05/08/19       PT Long Term Goals - 06/29/19 0859      PT LONG TERM GOAL #1   Title  Patient will be independent with ongoing/advanced HEP    Status  Partially Met    Target Date  07/24/19      PT LONG TERM GOAL #2   Title  Patient to demonstrate appropriate posture and body mechanics needed for daily activities    Status  Achieved      PT LONG TERM GOAL #3   Title  Patient to report pain reduction in frequency and intensity by >/= 75%    Status  Partially Met    Target Date  07/24/19      PT LONG TERM GOAL #4   Title  Patient to improve cervical and B shoulder AROM to WFL/WNL without pain provocation    Status  Partially Met   05/22/19 - met for shoulder ROM   Target Date  07/24/19      PT LONG TERM GOAL #5   Title  Patient to report ability to sleep as well as perform ADLs and household tasks without pain interference    Status  Partially Met    Target Date  07/24/19            Plan - 07/06/19 0806    Clinical Impression Statement  Diana [name] reporting good relief from ionto patch last visit with no pain reported this morning. She still notes some fatigue related discomfort with exercise or excessive upper body activity but feels like radial nerve glides have helped when she starts to feel the cramping sensation in her arm and hand. She feels that her posture has improved which is evident throughout the treatment session. PNF diagonals reviewed with minor clarifications for movement pattern - patient reporting improving tolerance (less fatigue noted). Machine strengthening focusing on chest and scapular  strengthening with good tolerance but sone fatigue noted. Treatment  concluded with reapplication of ionto patch and taping only for UT today.    Personal Factors and Comorbidities  Comorbidity 3+;Time since onset of injury/illness/exacerbation;Past/Current Experience    Comorbidities  CAD s/p NSTEMI 10/30/16 & CABG x 4 01/10/18; HTN; cervicalgia; carpal tunnel syndrome s/p CTR 02/12/16; GERD; bronchitis    Examination-Activity Limitations  Carry;Lift;Reach Overhead;Sleep    Examination-Participation Restrictions  Laundry;Shop;Cleaning    Rehab Potential  Good    PT Frequency  2x / week    PT Duration  6 weeks   4-6 weeks   PT Treatment/Interventions  ADLs/Self Care Home Management;Cryotherapy;Electrical Stimulation;Iontophoresis 62m/ml Dexamethasone;Moist Heat;Ultrasound;Functional mobility training;Therapeutic activities;Therapeutic exercise;Neuromuscular re-education;Patient/family education;Manual techniques;Scar mobilization;Passive range of motion;Dry needling;Taping;Vasopneumatic Device;Spinal Manipulations;Joint Manipulations    PT Next Visit Plan  VS check PRN; postural stretching and strengthening; manual therapy including STM/MFR, DN &/or taping for muscle spams/tightness; modalities PRN    PT Home Exercise Plan  04/02/19 - 3-way doorway pec stretch (high position deferred as of 04/10/19 - re-instated 06/05/19), reverse shoulder rolls, scap retraction, cervical retraction; 04/12/19 - UT, LS & SCM stretches, yellow (progressed to red as of 05/01/19 & green as of 06/15/19) TB rows & retraction; 05/08/19 - UE brachial plexus nerve glide; 05/24/19 - hooklying scapular retraction exercises with red TB; 06/22/19 - standing versions of scapular retraction (horiz ABD, ER & diagonals) with red TB, rhomboid stretch; 06/29/19 - radial nerve glide, UE PNF patterns with yellow TB    Consulted and Agree with Plan of Care  Patient       Patient will benefit from skilled therapeutic intervention in order to improve the following deficits and impairments:  Cardiopulmonary status  limiting activity, Decreased activity tolerance, Decreased mobility, Decreased range of motion, Decreased scar mobility, Decreased strength, Hypomobility, Increased fascial restricitons, Increased muscle spasms, Impaired perceived functional ability, Impaired flexibility, Impaired UE functional use, Improper body mechanics, Postural dysfunction, Pain  Visit Diagnosis: Other symptoms and signs involving the musculoskeletal system  Cervicalgia  Abnormal posture  Muscle weakness (generalized)     Problem List Patient Active Problem List   Diagnosis Date Noted  . Atypical chest pain 10/24/2018  . S/P CABG x 4 01/10/2018  . Hyperlipidemia 01/09/2018  . Unstable angina ([place] 01/05/2018  . CAD (coronary artery disease), native coronary artery 12/28/2017  . Asymptomatic microscopic hematuria 11/14/2017  . Iron deficiency anemia 09/05/2017  . Cervicalgia 12/18/2015  . Benign essential HTN 04/08/2015  . Metatarsalgia of right foot 02/11/2015    JPercival Spanish PT, MPT 07/06/2019, 8:58 AM  [facility] [place] [address]Phone: [phone]  Fax:  [phone] Name: Diana DUPUISMRN: [id number] of Birth: [birth year]

## 2019-07-10 ENCOUNTER — Other Ambulatory Visit: Payer: Self-pay

## 2019-07-10 ENCOUNTER — Encounter: Payer: Self-pay | Admitting: Physical Therapy

## 2019-07-10 ENCOUNTER — Ambulatory Visit: Payer: Medicaid Other | Admitting: Physical Therapy

## 2019-07-10 DIAGNOSIS — M542 Cervicalgia: Secondary | ICD-10-CM | POA: Diagnosis not present

## 2019-07-10 DIAGNOSIS — R29898 Other symptoms and signs involving the musculoskeletal system: Secondary | ICD-10-CM | POA: Diagnosis not present

## 2019-07-10 DIAGNOSIS — M6281 Muscle weakness (generalized): Secondary | ICD-10-CM | POA: Diagnosis not present

## 2019-07-10 DIAGNOSIS — R293 Abnormal posture: Secondary | ICD-10-CM | POA: Diagnosis not present

## 2019-07-10 NOTE — Patient Instructions (Signed)
    Home exercise program created by Diana [name], PT.  For questions, please contact Diana [name] via phone at [phone] or email at [name]  [name] [place] [address]  [address] [place], [place], [address] Phone: [phone]   Fax:  [phone]    

## 2019-07-10 NOTE — Therapy (Signed)
[facility] [place] [address]  [place], [place], [address] Phone: [phone]   Fax:  [phone]  Physical Therapy Treatment / Progress Note  Patient Details  Name: Diana [name] MRN: [id number] Date of Birth: [birth year] Referring Provider (PT): Jenne Campus, MD  Progress Note  Reporting Period 05/22/2019 to 07/10/2019  See note below for Objective Data and Assessment of Progress/Goals.     Encounter Date: 07/10/2019  PT End of Session - 07/10/19 0801    Visit Number  20    Number of Visits  26    Date for PT Re-Evaluation  07/26/19    Authorization Type  Medicaid    Authorization Time Period  06/15/19 - 07/26/19    Authorization - Visit Number  6    Authorization - Number of Visits  12    PT Start Time  0801    PT Stop Time  0851    PT Time Calculation (min)  50 min    Activity Tolerance  Patient tolerated treatment well    Behavior During Therapy  [facility] for tasks assessed/performed       Past Medical History:  Diagnosis Date  . Allergy   . Arthritis    feet  . Bronchitis 10/30/2016  . Carpal tunnel syndrome   . Coronary artery disease   . GERD (gastroesophageal reflux disease)   . Hyperlipidemia   . Hypertension   . Non-ST elevation (NSTEMI) myocardial infarction ([place]) 10/30/2016  . NSTEMI (non-ST elevated myocardial infarction) ([place]) 10/30/2016    Past Surgical History:  Procedure Laterality Date  . CARPAL TUNNEL RELEASE Left 02/12/2016   Procedure: LEFT CARPAL TUNNEL RELEASE;  Surgeon: Daryll Brod, MD;  Location: [place];  Service: Orthopedics;  Laterality: Left;  FAB  . CORONARY ARTERY BYPASS GRAFT N/A 01/10/2018   Procedure: CORONARY ARTERY BYPASS GRAFTING (CABG) TIMES 4, USING LEFT INTERNAL MAMMARY ARTERY TO OM1  AND ENDOSCOPICALLY HARVESTED RIGHT SAPHENOUS VEIN TO DIAGONAL, AND SEQUENTIALLY TO OM2 AND DISTAL CIRCUMFLEX;  Surgeon: Grace Isaac, MD;  Location: [place];  Service: Open  Heart Surgery;  Laterality: N/A;  . LEFT HEART CATH AND CORONARY ANGIOGRAPHY N/A 01/05/2018   Procedure: LEFT HEART CATH AND CORONARY ANGIOGRAPHY;  Surgeon: Troy Sine, MD;  Location: [place] [facility];  Service: Cardiovascular;  Laterality: N/A;  . TEE WITHOUT CARDIOVERSION N/A 01/10/2018   Procedure: TRANSESOPHAGEAL ECHOCARDIOGRAM (TEE);  Surgeon: Grace Isaac, MD;  Location: [place];  Service: Open Heart Surgery;  Laterality: N/A;  . TONSILLECTOMY    . TUBAL LIGATION    . WRIST SURGERY Right     There were no vitals filed for this visit.  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    Pertinent History  CABG x 4 - 01/10/18    Patient Stated Goals  "to have the pressure in my chest relieved"    Currently in Pain?  Yes    Pain Score  2     Pain Location  Chest    Pain Orientation  Left;Anterior;Medial;Lower    Pain Descriptors / Indicators  Discomfort;Sore    Pain Type  Chronic pain    Pain Frequency  Intermittent    Pain Score  0    Pain Location  Shoulder  [facility] PT Assessment - 07/10/19 0801      Assessment   Medical Diagnosis  Atypical chest pain    Referring Provider (PT)  Jenne Campus, MD    Onset Date/Surgical Date  --   Fall 2019   Hand Dominance  Right    Next MD Visit  Feb 2021      AROM   Right Shoulder Flexion  158 Degrees    Right Shoulder ABduction  166 Degrees    Right Shoulder Internal Rotation  --   FIR to T10 - mild discomfort in medial scapular region   Right Shoulder External Rotation  --   FER to T3   Left Shoulder Flexion  156 Degrees    Left Shoulder ABduction  162 Degrees    Left Shoulder Internal Rotation  --   FIR to T10 - mild discomfort in medial scapular region   Left Shoulder External Rotation  --   FER to T3   Cervical Flexion  60    Cervical Extension  57 - "pulling" in lower  chest (under breasts)    Cervical - Right Side Bend  35    Cervical - Left Side Bend  34 - mild discomfort on L    Cervical - Right Rotation  80    Cervical - Left Rotation  80      Strength   Right Shoulder Flexion  4+/5    Right Shoulder Extension  5/5    Right Shoulder ABduction  5/5    Right Shoulder Internal Rotation  5/5    Right Shoulder External Rotation  4+/5    Left Shoulder Flexion  4+/5    Left Shoulder Extension  5/5    Left Shoulder ABduction  5/5    Left Shoulder Internal Rotation  5/5    Left Shoulder External Rotation  4+/5                   [facility] Adult PT Treatment/Exercise - 07/10/19 0801      Shoulder Exercises: Prone   Extension  Both;AROM;Strengthening;10 reps    Extension Limitations  I's - leaning over green Pball on floor    Horizontal ABduction 1  Both;5 reps      Shoulder Exercises: ROM/Strengthening   UBE (Upper Arm Bike)  L2.5 x 6 min (3' fwd/3' back)             PT Education - 07/10/19 0851    Education Details  Review of current progress/status with PT including plan to transtition to HEP at end of current certification; Pball exercises for HEP per patient request    Person(s) Educated  Patient    Methods  Explanation;Demonstration;Verbal cues;Tactile cues;Handout    Comprehension  Verbalized understanding;Returned demonstration;Verbal cues required;Tactile cues required;Need further instruction       PT Short Term Goals - 05/11/19 0810      PT SHORT TERM GOAL #1   Title  Patient will be independent with initial HEP    Status  Achieved   04/16/19     PT SHORT TERM GOAL #2   Title  Patient to demonstrate understanding of good posture    Status  Achieved   04/16/2019     PT SHORT TERM GOAL #3   Title  Patient to report pain reduction in frequency and intensity by >/= 25%    Status  Achieved   30% improved as of 05/08/19       PT Long Term Goals - 07/10/19  0808      PT LONG TERM GOAL #1   Title  Patient will be  independent with ongoing/advanced HEP    Status  Partially Met    Target Date  07/26/19      PT LONG TERM GOAL #2   Title  Patient to demonstrate appropriate posture and body mechanics needed for daily activities    Status  Achieved   05/22/19     PT LONG TERM GOAL #3   Title  Patient to report pain reduction in frequency and intensity by >/= 75%    Status  Partially Met   07/10/19 - 70% improved   Target Date  07/26/19      PT LONG TERM GOAL #4   Title  Patient to improve cervical and B shoulder AROM to WFL/WNL without pain provocation    Status  Partially Met   07/10/19 - cervical & B shoulder ROM WNL but mild discomfort with cervical extension & L lateral flexion as well as B shoulder IR   Target Date  07/26/19      PT LONG TERM GOAL #5   Title  Patient to report ability to sleep as well as perform ADLs and household tasks without pain interference    Status  Partially Met   07/10/19 - only occasional sleep disturbance when pain present in periscapular area   Target Date  07/26/19            Plan - 07/10/19 0808    Clinical Impression Statement  Denny reporting 70% improvement in overall pain since start of PT. 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 She reports good awareness of posture and tries to utilize good body mechanics with daily activities. She reports no limitations with typical daily tasks and notes only minimal sleep disturbance due to pain, typically only if scapular pain present. Cervical and B shoulder AROM now WNL but mild chest discomfort still noted with cervical extension, mild L upper shoulder discomfort with L lateral flexion as well as mild periscapular discomfort noted with B shoulder IR. All goals currently at least partially met and patient appears to be on track to transition to HEP at end of current  certification period, therefore will plan to focus on review and update of HEP as indicated over remaining visits.    Personal Factors and Comorbidities  Comorbidity 3+;Time since onset of injury/illness/exacerbation;Past/Current Experience    Comorbidities  CAD s/p NSTEMI 10/30/16 & CABG x 4 01/10/18; HTN; cervicalgia; carpal tunnel syndrome s/p CTR 02/12/16; GERD; bronchitis    Examination-Activity Limitations  Carry;Lift;Reach Overhead;Sleep    Examination-Participation Restrictions  Laundry;Shop;Cleaning    Rehab Potential  Good    PT Frequency  2x / week    PT Duration  6 weeks   4-6 weeks   PT Treatment/Interventions  ADLs/Self Care Home Management;Cryotherapy;Electrical Stimulation;Iontophoresis 84m/ml Dexamethasone;Moist Heat;Ultrasound;Functional mobility training;Therapeutic activities;Therapeutic exercise;Neuromuscular re-education;Patient/family education;Manual techniques;Scar mobilization;Passive range of motion;Dry needling;Taping;Vasopneumatic Device;Spinal Manipulations;Joint Manipulations    PT Next Visit Plan  HEP review & update as indicated; postural stretching and strengthening; manual therapy including STM/MFR, DN &/or taping for muscle spams/tightness; modalities PRN; VS check PRN    PT Home Exercise Plan  04/02/19 - 3-way doorway pec stretch (high position deferred as of 04/10/19 - re-instated 06/05/19), reverse shoulder rolls, scap retraction, cervical retraction; 04/12/19 - UT, LS & SCM stretches,  yellow (progressed to red as of 05/01/19 & green as of 06/15/19) TB rows & retraction; 05/08/19 - UE brachial plexus nerve glide; 05/24/19 - hooklying scapular retraction exercises with red TB; 06/22/19 - standing versions of scapular retraction (horiz ABD, ER & diagonals) with red TB, rhomboid stretch; 06/29/19 - radial nerve glide, UE PNF patterns with yellow TB; 07/10/19 - I's/T's/W's over 65cm Pball, lower chest/abdominal stretch over Pball, 3-way prayer stretch with Pball    Consulted and  Agree with Plan of Care  Patient       Patient will benefit from skilled therapeutic intervention in order to improve the following deficits and impairments:  Cardiopulmonary status limiting activity, Decreased activity tolerance, Decreased mobility, Decreased range of motion, Decreased scar mobility, Decreased strength, Hypomobility, Increased fascial restricitons, Increased muscle spasms, Impaired perceived functional ability, Impaired flexibility, Impaired UE functional use, Improper body mechanics, Postural dysfunction, Pain  Visit Diagnosis: Other symptoms and signs involving the musculoskeletal system  Cervicalgia  Abnormal posture  Muscle weakness (generalized)     Problem List Patient Active Problem List   Diagnosis Date Noted  . Atypical chest pain 10/24/2018  . S/P CABG x 4 01/10/2018  . Hyperlipidemia 01/09/2018  . Unstable angina ([place]) 01/05/2018  . CAD (coronary artery disease), native coronary artery 12/28/2017  . Asymptomatic microscopic hematuria 11/14/2017  . Iron deficiency anemia 09/05/2017  . Cervicalgia 12/18/2015  . Benign essential HTN 04/08/2015  . Metatarsalgia of right foot 02/11/2015    Percival Spanish, PT, MPT 07/10/2019, 12:26 PM  [facility] [address]  [place], [place], [address] Phone: [phone]   Fax:  [phone]  Name: Diana [name] MRN: [id number] Date of Birth: [birth year]

## 2019-07-13 ENCOUNTER — Ambulatory Visit: Payer: Medicaid Other | Admitting: Physical Therapy

## 2019-07-19 ENCOUNTER — Ambulatory Visit: Payer: Medicaid Other | Admitting: Physical Therapy

## 2019-07-20 ENCOUNTER — Other Ambulatory Visit: Payer: Self-pay

## 2019-07-20 ENCOUNTER — Encounter: Payer: Self-pay | Admitting: Physical Therapy

## 2019-07-20 ENCOUNTER — Ambulatory Visit: Payer: Medicaid Other | Admitting: Physical Therapy

## 2019-07-20 DIAGNOSIS — M6281 Muscle weakness (generalized): Secondary | ICD-10-CM | POA: Diagnosis not present

## 2019-07-20 DIAGNOSIS — M542 Cervicalgia: Secondary | ICD-10-CM

## 2019-07-20 DIAGNOSIS — R29898 Other symptoms and signs involving the musculoskeletal system: Secondary | ICD-10-CM

## 2019-07-20 DIAGNOSIS — R293 Abnormal posture: Secondary | ICD-10-CM | POA: Diagnosis not present

## 2019-07-20 NOTE — Therapy (Signed)
[facility] [address]  [place], [place], [address] Phone: [phone]   Fax:  [phone]  Physical Therapy Treatment  Patient Details  Name: Diana [name] MRN: [id number] Date of Birth: 01-21-1956 Referring Provider (PT): Jenne Campus, MD   Encounter Date: 07/20/2019  PT End of Session - 07/20/19 1233    Visit Number  21    Number of Visits  26    Date for PT Re-Evaluation  07/26/19    Authorization Type  Medicaid    Authorization Time Period  06/15/19 - 07/26/19    Authorization - Visit Number  7    Authorization - Number of Visits  12    PT Start Time  2202    PT Stop Time  1320    PT Time Calculation (min)  47 min    Activity Tolerance  Patient tolerated treatment well    Behavior During Therapy  [facility] for tasks assessed/performed       Past Medical History:  Diagnosis Date  . Allergy   . Arthritis    feet  . Bronchitis 10/30/2016  . Carpal tunnel syndrome   . Coronary artery disease   . GERD (gastroesophageal reflux disease)   . Hyperlipidemia   . Hypertension   . Non-ST elevation (NSTEMI) myocardial infarction ([place]) 10/30/2016  . NSTEMI (non-ST elevated myocardial infarction) ([place]) 10/30/2016    Past Surgical History:  Procedure Laterality Date  . CARPAL TUNNEL RELEASE Left 02/12/2016   Procedure: LEFT CARPAL TUNNEL RELEASE;  Surgeon: Daryll Brod, MD;  Location: [place];  Service: Orthopedics;  Laterality: Left;  FAB  . CORONARY ARTERY BYPASS GRAFT N/A 01/10/2018   Procedure: CORONARY ARTERY BYPASS GRAFTING (CABG) TIMES 4, USING LEFT INTERNAL MAMMARY ARTERY TO OM1  AND ENDOSCOPICALLY HARVESTED RIGHT SAPHENOUS VEIN TO DIAGONAL, AND SEQUENTIALLY TO OM2 AND DISTAL CIRCUMFLEX;  Surgeon: Grace Isaac, MD;  Location: [place];  Service: Open Heart Surgery;  Laterality: N/A;  . LEFT HEART CATH AND CORONARY ANGIOGRAPHY N/A 01/05/2018   Procedure: LEFT HEART CATH AND CORONARY ANGIOGRAPHY;   Surgeon: Troy Sine, MD;  Location: [facility];  Service: Cardiovascular;  Laterality: N/A;  . TEE WITHOUT CARDIOVERSION N/A 01/10/2018   Procedure: TRANSESOPHAGEAL ECHOCARDIOGRAM (TEE);  Surgeon: Grace Isaac, MD;  Location: [place];  Service: Open Heart Surgery;  Laterality: N/A;  . TONSILLECTOMY    . TUBAL LIGATION    . WRIST SURGERY Right     There were no vitals filed for this visit.  Subjective Assessment - 07/20/19 1236    Subjective  Pt reporting she slipped on black ice on Sunday catching herself on her car door and her L chest and L neck and shoulder have been hurting since.    Pertinent History  CABG x 4 - 01/10/18    Patient Stated Goals  "to have the pressure in my chest relieved"    Currently in Pain?  Yes    Pain Score  4     Pain Location  Chest    Pain Orientation  Left    Pain Descriptors / Indicators  Heaviness    Pain Type  Chronic pain    Pain Frequency  Constant    Pain Score  7   6-7/10   Pain Location  Neck   & L shoulder   Pain Orientation  Left    Pain Descriptors / Indicators  --   "just hurts"  Pain Type  Chronic pain    Pain Frequency  Constant                       [facility] Adult PT Treatment/Exercise - 07/20/19 1233      Shoulder Exercises: ROM/Strengthening   UBE (Upper Arm Bike)  L3.0 x 6 min (3' fwd/3' back)      Modalities   Modalities  Iontophoresis      Iontophoresis   Type of Iontophoresis  Dexamethasone    Location  L mid sternocostal junction    Dose  80 mA-min; 1.0 mL    Time  4-6 hr patch (#3 of 6 to sternum), (#1 of 6 to L UT/LS)      Manual Therapy   Manual Therapy  Soft tissue mobilization;Myofascial release;Passive ROM    Soft tissue mobilization  STM to B lower cervical paraspinals, UT/LS and periscapular muscles; gentle STM to L medial pec major/minor & sternal incision    Myofascial Release  manual TPR to L UT/LS; pin and stretch to L UT/LS    Passive ROM  manual gentle B pec stretch with  overpressure into scap retraction & depression for UT/LS stretch       Trigger Point Dry Needling - 07/20/19 1233    Consent Given?  Yes    Muscles Treated Head and Neck  Upper trapezius;Levator scapulae    Upper Trapezius Response  Twitch reponse elicited;Palpable increased muscle length   Left   Levator Scapulae Response  Twitch response elicited;Palpable increased muscle length   Left            PT Short Term Goals - 05/11/19 0810      PT SHORT TERM GOAL #1   Title  Patient will be independent with initial HEP    Status  Achieved   04/16/19     PT SHORT TERM GOAL #2   Title  Patient to demonstrate understanding of good posture    Status  Achieved   04/16/2019     PT SHORT TERM GOAL #3   Title  Patient to report pain reduction in frequency and intensity by >/= 25%    Status  Achieved   30% improved as of 05/08/19       PT Long Term Goals - 07/10/19 0808      PT LONG TERM GOAL #1   Title  Patient will be independent with ongoing/advanced HEP    Status  Partially Met    Target Date  07/26/19      PT LONG TERM GOAL #2   Title  Patient to demonstrate appropriate posture and body mechanics needed for daily activities    Status  Achieved   05/22/19     PT LONG TERM GOAL #3   Title  Patient to report pain reduction in frequency and intensity by >/= 75%    Status  Partially Met   07/10/19 - 70% improved   Target Date  07/26/19      PT LONG TERM GOAL #4   Title  Patient to improve cervical and B shoulder AROM to WFL/WNL without pain provocation    Status  Partially Met   07/10/19 - cervical & B shoulder ROM WNL but mild discomfort with cervical extension & L lateral flexion as well as B shoulder IR   Target Date  07/26/19      PT LONG TERM GOAL #5   Title  Patient to report ability to sleep as well  as perform ADLs and household tasks without pain interference    Status  Partially Met   07/10/19 - only occasional sleep disturbance when pain present in periscapular  area   Target Date  07/26/19            Plan - 07/20/19 1320    Clinical Impression Statement  Diana [name] reporting exacerbation of the pain in her chest and L upper shoulder/neck since Sunday when she slipped on black ice catching herself with her L hand/arm on her car door. She is very ttp over her L mid sternocostal junction and medial pecs as well ttp with increased muscle tension in L UT and LS. Addressed all areas with manual therapy including gentle STM to chest and STM/DTM with MFR/TPR and DN to L UT/LS followed by stretching with manual overpressure from PT. Pain and tension somewhat improved in upper shoulder following manual therapy but she remained very ttp along the L mid sternocostal junction and medial pecs, therefore session concluded with ionto patches to promote further reduction in pain and inflammation. Diana [name] had been progressing well with PT and was on track to transition to HEP next week, however given recent acute exacerbation, may need to consider recert if pain not improved by end of current Medicaid authorization.    Personal Factors and Comorbidities  Comorbidity 3+;Time since onset of injury/illness/exacerbation;Past/Current Experience    Comorbidities  CAD s/p NSTEMI 10/30/16 & CABG x 4 01/10/18; HTN; cervicalgia; carpal tunnel syndrome s/p CTR 02/12/16; GERD; bronchitis    Examination-Activity Limitations  Carry;Lift;Reach Overhead;Sleep    Examination-Participation Restrictions  Laundry;Shop;Cleaning    Rehab Potential  Good    PT Frequency  2x / week    PT Duration  6 weeks   4-6 weeks   PT Treatment/Interventions  ADLs/Self Care Home Management;Cryotherapy;Electrical Stimulation;Iontophoresis 36m/ml Dexamethasone;Moist Heat;Ultrasound;Functional mobility training;Therapeutic activities;Therapeutic exercise;Neuromuscular re-education;Patient/family education;Manual techniques;Scar mobilization;Passive range of motion;Dry needling;Taping;Vasopneumatic Device;Spinal  Manipulations;Joint Manipulations    PT Next Visit Plan  HEP review & update as indicated; postural stretching and strengthening; manual therapy including STM/MFR, DN &/or taping for muscle spams/tightness; modalities PRN; VS check PRN    PT Home Exercise Plan  04/02/19 - 3-way doorway pec stretch (high position deferred as of 04/10/19 - re-instated 06/05/19), reverse shoulder rolls, scap retraction, cervical retraction; 04/12/19 - UT, LS & SCM stretches, yellow (progressed to red as of 05/01/19 & green as of 06/15/19) TB rows & retraction; 05/08/19 - UE brachial plexus nerve glide; 05/24/19 - hooklying scapular retraction exercises with red TB; 06/22/19 - standing versions of scapular retraction (horiz ABD, ER & diagonals) with red TB, rhomboid stretch; 06/29/19 - radial nerve glide, UE PNF patterns with yellow TB; 07/10/19 - I's/T's/W's over 65cm Pball, lower chest/abdominal stretch over Pball, 3-way prayer stretch with Pball    Consulted and Agree with Plan of Care  Patient       Patient will benefit from skilled therapeutic intervention in order to improve the following deficits and impairments:  Cardiopulmonary status limiting activity, Decreased activity tolerance, Decreased mobility, Decreased range of motion, Decreased scar mobility, Decreased strength, Hypomobility, Increased fascial restricitons, Increased muscle spasms, Impaired perceived functional ability, Impaired flexibility, Impaired UE functional use, Improper body mechanics, Postural dysfunction, Pain  Visit Diagnosis: Other symptoms and signs involving the musculoskeletal system  Cervicalgia  Abnormal posture  Muscle weakness (generalized)     Problem List Patient Active Problem List   Diagnosis Date Noted  . Atypical chest pain 10/24/2018  . S/P CABG x 4 01/10/2018  .  Hyperlipidemia 01/09/2018  . Unstable angina ([place]) 01/05/2018  . CAD (coronary artery disease), native coronary artery 12/28/2017  . Asymptomatic microscopic  hematuria 11/14/2017  . Iron deficiency anemia 09/05/2017  . Cervicalgia 12/18/2015  . Benign essential HTN 04/08/2015  . Metatarsalgia of right foot 02/11/2015    Percival Spanish, PT, MPT 07/20/2019, 2:02 PM  [facility] [address]  [place], [place], [address] Phone: [phone]   Fax:  [phone]  Name: Diana [name] MRN: [id number] Date of Birth: 1956/05/09

## 2019-07-23 ENCOUNTER — Other Ambulatory Visit: Payer: Self-pay

## 2019-07-23 ENCOUNTER — Ambulatory Visit: Payer: Medicaid Other | Admitting: Physical Therapy

## 2019-07-23 ENCOUNTER — Encounter: Payer: Self-pay | Admitting: Physical Therapy

## 2019-07-23 DIAGNOSIS — M542 Cervicalgia: Secondary | ICD-10-CM

## 2019-07-23 DIAGNOSIS — R29898 Other symptoms and signs involving the musculoskeletal system: Secondary | ICD-10-CM | POA: Diagnosis not present

## 2019-07-23 DIAGNOSIS — R293 Abnormal posture: Secondary | ICD-10-CM

## 2019-07-23 DIAGNOSIS — M6281 Muscle weakness (generalized): Secondary | ICD-10-CM

## 2019-07-23 NOTE — Therapy (Signed)
[facility] [address]  [place], [place], [address] Phone: [phone]   Fax:  [phone]  Physical Therapy Treatment  Patient Details  Name: Diana [name] MRN: [id number] Date of Birth: 06/22/1955 Referring Provider (PT): Jenne Campus, MD   Encounter Date: 07/23/2019  PT End of Session - 07/23/19 0802    Visit Number  22    Number of Visits  26    Date for PT Re-Evaluation  07/26/19    Authorization Type  Medicaid    Authorization Time Period  06/15/19 - 07/26/19    Authorization - Visit Number  8    Authorization - Number of Visits  12    PT Start Time  0802    PT Stop Time  0852    PT Time Calculation (min)  50 min    Activity Tolerance  Patient tolerated treatment well    Behavior During Therapy  [facility] for tasks assessed/performed       Past Medical History:  Diagnosis Date  . Allergy   . Arthritis    feet  . Bronchitis 10/30/2016  . Carpal tunnel syndrome   . Coronary artery disease   . GERD (gastroesophageal reflux disease)   . Hyperlipidemia   . Hypertension   . Non-ST elevation (NSTEMI) myocardial infarction ([place]) 10/30/2016  . NSTEMI (non-ST elevated myocardial infarction) ([place]) 10/30/2016    Past Surgical History:  Procedure Laterality Date  . CARPAL TUNNEL RELEASE Left 02/12/2016   Procedure: LEFT CARPAL TUNNEL RELEASE;  Surgeon: Daryll Brod, MD;  Location: [employer];  Service: Orthopedics;  Laterality: Left;  FAB  . CORONARY ARTERY BYPASS GRAFT N/A 01/10/2018   Procedure: CORONARY ARTERY BYPASS GRAFTING (CABG) TIMES 4, USING LEFT INTERNAL MAMMARY ARTERY TO OM1  AND ENDOSCOPICALLY HARVESTED RIGHT SAPHENOUS VEIN TO DIAGONAL, AND SEQUENTIALLY TO OM2 AND DISTAL CIRCUMFLEX;  Surgeon: Grace Isaac, MD;  Location: [facility];  Service: Open Heart Surgery;  Laterality: N/A;  . LEFT HEART CATH AND CORONARY ANGIOGRAPHY N/A 01/05/2018   Procedure: LEFT HEART CATH AND CORONARY ANGIOGRAPHY;   Surgeon: Troy Sine, MD;  Location: [facility];  Service: Cardiovascular;  Laterality: N/A;  . TEE WITHOUT CARDIOVERSION N/A 01/10/2018   Procedure: TRANSESOPHAGEAL ECHOCARDIOGRAM (TEE);  Surgeon: Grace Isaac, MD;  Location: [place];  Service: Open Heart Surgery;  Laterality: N/A;  . TONSILLECTOMY    . TUBAL LIGATION    . WRIST SURGERY Right     There were no vitals filed for this visit.  Subjective Assessment - 07/23/19 0806    Subjective  Pt reporting pain slightly improved over the weekend but still flared up from catching herself during slip on the ice last week.    Pertinent History  CABG x 4 - 01/10/18    Patient Stated Goals  "to have the pressure in my chest relieved"    Currently in Pain?  Yes    Pain Score  3     Pain Location  Chest    Pain Orientation  Left;Anterior;Medial    Pain Descriptors / Indicators  Heaviness    Pain Type  Chronic pain    Pain Frequency  Constant    Pain Score  5    Pain Location  Neck   & L shoulder   Pain Orientation  Left    Pain Type  Chronic pain    Pain Frequency  Constant  [facility] PT Assessment - 07/23/19 0802      Assessment   Medical Diagnosis  Atypical chest pain    Referring Provider (PT)  Robert Krasowski, MD    Onset Date/Surgical Date  --   Fall 2019   Hand Dominance  Right    Next MD Visit  Feb 2021      AROM   Right Shoulder Flexion  154 Degrees   pain in posterior shoulder   Right Shoulder ABduction  162 Degrees    Right Shoulder Internal Rotation  --   FIR to T10 - mild discomfort in medial scapular region   Right Shoulder External Rotation  --   FER to T3   Left Shoulder Flexion  158 Degrees   pain in lateral shoulder & upper arm   Left Shoulder ABduction  165 Degrees   pain in lateral shoulder & upper arm   Left Shoulder Internal Rotation  --   FIR to T10 - mild discomfort in medial scapular region   Left Shoulder External Rotation  --   FER to T3   Cervical Flexion  48    Cervical  Extension  61 - "pulling" along medial L scapula    Cervical - Right Side Bend  36    Cervical - Left Side Bend  32 - "pulling in L side of neck    Cervical - Right Rotation  78    Cervical - Left Rotation  65 - "pinching"                   [facility] Adult PT Treatment/Exercise - 07/23/19 0802      Exercises   Exercises  Neck;Shoulder      Neck Exercises: Theraband   Shoulder External Rotation  10 reps;Red    Shoulder External Rotation Limitations  standing against door frame as tactile cue for scap retraction    Horizontal ABduction  10 reps;Red    Horizontal ABduction Limitations  standing against door frame as tactile cue for scap retraction    Other Theraband Exercises  Alt UE diagonals + scap retraction with red TB x 10; standing against door frame as tactile cue for scap retraction      Shoulder Exercises: Standing   Row  Both;10 reps;Theraband;Strengthening    Theraband Level (Shoulder Row)  Level 3 (Green)    Row Limitations  cues to tuck elbows into ribs + scap retraction as well as cues to avoid shoulder shrug    Retraction  Both;10 reps;Theraband;Strengthening    Theraband Level (Shoulder Retraction)  Level 3 (Green)    Retraction Limitations  scap retraction/depression + mini-shoulder extension; cues to avoid shoulder hike      Shoulder Exercises: ROM/Strengthening   UBE (Upper Arm Bike)  L3.0 x 6 min (3' fwd/3' back)      Manual Therapy   Manual Therapy  Taping      K[employer]ix   Create Space  30% along sternum from xiphoid process to sternal notch   Light blue Kinesio Tex Gold Light Touch + (sensitive skin)   Inhibit Muscle   B UT & LS/thoracic paraspinals 30%    blue KinesioTex tape     Neck Exercises: Stretches   Upper Trapezius Stretch  Left;30 seconds;1 rep    Levator Stretch  Left;30 seconds;1 rep    Other Neck Stretches  R/L SCM & scalene stretches 2 x 30 sec               PT Short   Term Goals - 05/11/19 0810      PT SHORT TERM GOAL #1    Title  Patient will be independent with initial HEP    Status  Achieved   04/16/19     PT SHORT TERM GOAL #2   Title  Patient to demonstrate understanding of good posture    Status  Achieved   04/16/2019     PT SHORT TERM GOAL #3   Title  Patient to report pain reduction in frequency and intensity by >/= 25%    Status  Achieved   30% improved as of 05/08/19       PT Long Term Goals - 07/10/19 0808      PT LONG TERM GOAL #1   Title  Patient will be independent with ongoing/advanced HEP    Status  Partially Met    Target Date  07/26/19      PT LONG TERM GOAL #2   Title  Patient to demonstrate appropriate posture and body mechanics needed for daily activities    Status  Achieved   05/22/19     PT LONG TERM GOAL #3   Title  Patient to report pain reduction in frequency and intensity by >/= 75%    Status  Partially Met   07/10/19 - 70% improved   Target Date  07/26/19      PT LONG TERM GOAL #4   Title  Patient to improve cervical and B shoulder AROM to WFL/WNL without pain provocation    Status  Partially Met   07/10/19 - cervical & B shoulder ROM WNL but mild discomfort with cervical extension & L lateral flexion as well as B shoulder IR   Target Date  07/26/19      PT LONG TERM GOAL #5   Title  Patient to report ability to sleep as well as perform ADLs and household tasks without pain interference    Status  Partially Met   07/10/19 - only occasional sleep disturbance when pain present in periscapular area   Target Date  07/26/19            Plan - 07/23/19 0809    Clinical Impression Statement  Diana [name] reporting some improvement in pain following manual therapy and ionto patches last visit but pain remains elevated since slip and near fall 8 days ago.  Cervical ROM slightly decreased in flexion and L rotation with increased discomfort noted in multiple planes, however shoulder ROM unchanged or slight better albeit with increased discomfort noted. Patient still hopeful  to proceed with transition to HEP as planned on next visit, therefore session focusing on initiation of HEP review, confirming technique for stretches and clarifying theraband colors for strengthening exercises. Patient to bring HEP handouts to next visit to allow for notations to better clarify ongoing resistance levels and reps. UE diagonals deferred for now due to pain reported during home attempts but will potentially reattempt next visit pending pain levels.    Personal Factors and Comorbidities  Comorbidity 3+;Time since onset of injury/illness/exacerbation;Past/Current Experience    Comorbidities  CAD s/p NSTEMI 10/30/16 & CABG x 4 01/10/18; HTN; cervicalgia; carpal tunnel syndrome s/p CTR 02/12/16; GERD; bronchitis    Examination-Activity Limitations  Carry;Lift;Reach Overhead;Sleep    Examination-Participation Restrictions  Laundry;Shop;Cleaning    Rehab Potential  Good    PT Frequency  2x / week    PT Duration  6 weeks   4-6 weeks   PT Treatment/Interventions  ADLs/Self Care Home Management;Cryotherapy;Electrical Stimulation;Iontophoresis 4mg/ml Dexamethasone;Moist   Heat;Ultrasound;Functional mobility training;Therapeutic activities;Therapeutic exercise;Neuromuscular re-education;Patient/family education;Manual techniques;Scar mobilization;Passive range of motion;Dry needling;Taping;Vasopneumatic Device;Spinal Manipulations;Joint Manipulations    PT Next Visit Plan  recert vs transition to HEP +/- 30-day hold    PT Home Exercise Plan  04/02/19 - 3-way doorway pec stretch (high position deferred as of 04/10/19 - re-instated 06/05/19), reverse shoulder rolls, scap retraction, cervical retraction; 04/12/19 - UT, LS & SCM stretches, yellow (progressed to red as of 05/01/19 & green as of 06/15/19) TB rows & retraction; 05/08/19 - UE brachial plexus nerve glide; 05/24/19 - hooklying scapular retraction exercises with red TB; 06/22/19 - standing versions of scapular retraction (horiz ABD, ER & diagonals) with red  TB, rhomboid stretch; 06/29/19 - radial nerve glide, UE PNF patterns with yellow TB; 07/10/19 - I's/T's/W's over 65cm Pball, lower chest/abdominal stretch over Pball, 3-way prayer stretch with Pball    Consulted and Agree with Plan of Care  Patient       Patient will benefit from skilled therapeutic intervention in order to improve the following deficits and impairments:  Cardiopulmonary status limiting activity, Decreased activity tolerance, Decreased mobility, Decreased range of motion, Decreased scar mobility, Decreased strength, Hypomobility, Increased fascial restricitons, Increased muscle spasms, Impaired perceived functional ability, Impaired flexibility, Impaired UE functional use, Improper body mechanics, Postural dysfunction, Pain  Visit Diagnosis: Other symptoms and signs involving the musculoskeletal system  Cervicalgia  Abnormal posture  Muscle weakness (generalized)     Problem List Patient Active Problem List   Diagnosis Date Noted  . Atypical chest pain 10/24/2018  . S/P CABG x 4 01/10/2018  . Hyperlipidemia 01/09/2018  . Unstable angina ([place]) 01/05/2018  . CAD (coronary artery disease), native coronary artery 12/28/2017  . Asymptomatic microscopic hematuria 11/14/2017  . Iron deficiency anemia 09/05/2017  . Cervicalgia 12/18/2015  . Benign essential HTN 04/08/2015  . Metatarsalgia of right foot 02/11/2015    Percival Spanish, PT, MPT 07/23/2019, 8:21 PM  [facility] [address]  [place], [place], [address] Phone: [phone]   Fax:  [phone]  Name: Diana [name] MRN: [id number] Date of Birth: [birth year]

## 2019-07-25 ENCOUNTER — Other Ambulatory Visit: Payer: Self-pay

## 2019-07-25 ENCOUNTER — Ambulatory Visit: Payer: Medicaid Other | Admitting: Physical Therapy

## 2019-07-25 ENCOUNTER — Encounter: Payer: Medicaid Other | Admitting: Physical Therapy

## 2019-07-25 DIAGNOSIS — R293 Abnormal posture: Secondary | ICD-10-CM

## 2019-07-25 DIAGNOSIS — M542 Cervicalgia: Secondary | ICD-10-CM | POA: Diagnosis not present

## 2019-07-25 DIAGNOSIS — R29898 Other symptoms and signs involving the musculoskeletal system: Secondary | ICD-10-CM

## 2019-07-25 DIAGNOSIS — M6281 Muscle weakness (generalized): Secondary | ICD-10-CM | POA: Diagnosis not present

## 2019-07-25 NOTE — Therapy (Signed)
[facility] [address]  [place], [place], [address] Phone: [phone]   Fax:  [phone]  Physical Therapy Treatment / Recert  Patient Details  Name: Diana [name] MRN: [id number] Date of Birth: August 12, 1955 Referring Provider (PT): Jenne Campus, MD   Encounter Date: 07/25/2019  PT End of Session - 07/25/19 0802    Visit Number  23    Number of Visits  27    Date for PT Re-Evaluation  08/22/19    Authorization Type  Medicaid    Authorization Time Period  06/15/19 - 07/26/19    Authorization - Visit Number  9    Authorization - Number of Visits  12    PT Start Time  0802    PT Stop Time  3785    PT Time Calculation (min)  55 min    Activity Tolerance  Patient tolerated treatment well    Behavior During Therapy  [facility] for tasks assessed/performed       Past Medical History:  Diagnosis Date  . Allergy   . Arthritis    feet  . Bronchitis 10/30/2016  . Carpal tunnel syndrome   . Coronary artery disease   . GERD (gastroesophageal reflux disease)   . Hyperlipidemia   . Hypertension   . Non-ST elevation (NSTEMI) myocardial infarction ([place]) 10/30/2016  . NSTEMI (non-ST elevated myocardial infarction) ([place]) 10/30/2016    Past Surgical History:  Procedure Laterality Date  . CARPAL TUNNEL RELEASE Left 02/12/2016   Procedure: LEFT CARPAL TUNNEL RELEASE;  Surgeon: Daryll Brod, MD;  Location: [place];  Service: Orthopedics;  Laterality: Left;  FAB  . CORONARY ARTERY BYPASS GRAFT N/A 01/10/2018   Procedure: CORONARY ARTERY BYPASS GRAFTING (CABG) TIMES 4, USING LEFT INTERNAL MAMMARY ARTERY TO OM1  AND ENDOSCOPICALLY HARVESTED RIGHT SAPHENOUS VEIN TO DIAGONAL, AND SEQUENTIALLY TO OM2 AND DISTAL CIRCUMFLEX;  Surgeon: Grace Isaac, MD;  Location: [place];  Service: Open Heart Surgery;  Laterality: N/A;  . LEFT HEART CATH AND CORONARY ANGIOGRAPHY N/A 01/05/2018   Procedure: LEFT HEART CATH AND CORONARY  ANGIOGRAPHY;  Surgeon: Troy Sine, MD;  Location: [place] [facility];  Service: Cardiovascular;  Laterality: N/A;  . TEE WITHOUT CARDIOVERSION N/A 01/10/2018   Procedure: TRANSESOPHAGEAL ECHOCARDIOGRAM (TEE);  Surgeon: Grace Isaac, MD;  Location: [facility];  Service: Open Heart Surgery;  Laterality: N/A;  . TONSILLECTOMY    . TUBAL LIGATION    . WRIST SURGERY Right     There were no vitals filed for this visit.  Subjective Assessment - 07/25/19 0805    Subjective  Pt feels like the tape helped with both neck and chest pain better today.    Pertinent History  CABG x 4 - 01/10/18    Patient Stated Goals  "to have the pressure in my chest relieved"    Currently in Pain?  Yes    Pain Score  2     Pain Location  Chest    Pain Orientation  Left;Anterior;Medial    Pain Descriptors / Indicators  Heaviness    Pain Type  Chronic pain    Pain Frequency  Constant    Pain Score  4    Pain Location  Neck   & L shoulder   Pain Orientation  Left    Pain Descriptors / Indicators  Sore    Pain Type  Chronic pain    Pain Frequency  Constant  [facility] PT Assessment - 07/25/19 0802      Assessment   Medical Diagnosis  Atypical chest pain    Referring Provider (PT)  Jenne Campus, MD    Onset Date/Surgical Date  --   Fall 2019   Hand Dominance  Right    Next MD Visit  ~March 2021      Prior Function   Level of Independence  Independent    Vocation  Unemployed   since surgery   Leisure  online exercises 3x/wk, walking 5 days/week      AROM   Right Shoulder Flexion  154 Degrees    Right Shoulder ABduction  146 Degrees    Right Shoulder Internal Rotation  --   FIR to T10 - mild discomfort in medial scapular region   Right Shoulder External Rotation  --   FER to T3   Left Shoulder Flexion  140 Degrees   pain in lateral shoulder & upper arm   Left Shoulder ABduction  131 Degrees   pain in lateral neck, shoulder & upper arm   Left Shoulder Internal Rotation  --   FIR to  T10 - mild discomfort in medial scapular region   Left Shoulder External Rotation  --   FER to T3   Cervical Flexion  56    Cervical Extension  52 - "pulling" in chest    Cervical - Right Side Bend  36    Cervical - Left Side Bend  32 - "pulling in L side of neck & scapula    Cervical - Right Rotation  68    Cervical - Left Rotation  65 - "pinching"                   [facility] Adult PT Treatment/Exercise - 07/25/19 0802      Exercises   Exercises  Neck;Shoulder      Shoulder Exercises: ROM/Strengthening   UBE (Upper Arm Bike)  L3.0 x 6 min (3' fwd/3' back)      Manual Therapy   Manual Therapy  Taping      Kinesiotix   Create Space  30% across L pec & downward along medial breast/lateral sternum   Light blue Kinesio Tex Gold Light Touch + (sensitive skin)   Inhibit Muscle   B UT & LS/thoracic paraspinals 30%    blue KinesioTex tape            PT Education - 07/25/19 1002    Education Details  Review of current HEP focusing on exercise & resistance modification due to recent exacerbation; Progressive Muscle Relaxation Techniques & Grounding Techniques for stress management    Person(s) Educated  Patient    Methods  Explanation;Demonstration;Handout    Comprehension  Verbalized understanding;Need further instruction       PT Short Term Goals - 05/11/19 0810      PT SHORT TERM GOAL #1   Title  Patient will be independent with initial HEP    Status  Achieved   04/16/19     PT SHORT TERM GOAL #2   Title  Patient to demonstrate understanding of good posture    Status  Achieved   04/16/2019     PT SHORT TERM GOAL #3   Title  Patient to report pain reduction in frequency and intensity by >/= 25%    Status  Achieved   30% improved as of 05/08/19       PT Long Term Goals - 07/25/19 0141  PT LONG TERM GOAL #1   Title  Patient will be independent with ongoing/advanced HEP    Status  Partially Met    Target Date  08/22/19      PT LONG TERM GOAL #2    Title  Patient to demonstrate appropriate posture and body mechanics needed for daily activities    Status  Achieved   05/22/19     PT LONG TERM GOAL #3   Title  Patient to report pain reduction in frequency and intensity by >/= 75%    Status  Partially Met   07/25/19 - 70% improved   Target Date  08/22/19      PT LONG TERM GOAL #4   Title  Patient to improve cervical and B shoulder AROM to WFL/WNL without pain provocation    Status  Partially Met    Target Date  08/22/19      PT LONG TERM GOAL #5   Title  Patient to report ability to sleep as well as perform ADLs and household tasks without pain interference    Status  Partially Met    Target Date  08/22/19            Plan - 07/25/19 0857    Clinical Impression Statement  Diana [name] reporting increased pain in her chest and L upper shoulder/neck from recent exacerbation (slip and near fall on black ice on 07/15/19 where she caught herself with her L hand/arm on her car door) is starting to subside and feels that pain is overall 70% improved since initial eval. Prior to above incident, patient had been nearly pain free with full ROM and only very minimal limitations in activity tolerance. Recent pain exacerbation has resulted in decreased neck and shoulder ROM relative to measurements taken on 07/10/19 prior to incident. Increased neck and shoulder pain, and to a lesser degree muscular chest pain, now continues to cause her difficulty with sleeping, washing dishes, vacuuming, carrying laundry, and driving especially checking blind spot or reaching to back seat. Patient had been on track to transition to HEP and end of POC but now with recent exacerbation LTGs only partially met, therefore will recommend recert with frequency reduced to 1x/wk for up to 4 weeks to allow restoration of full ROM and activity tolerance with minimal to no pain interference.    Personal Factors and Comorbidities  Comorbidity 3+;Time since onset of  injury/illness/exacerbation;Past/Current Experience    Comorbidities  CAD s/p NSTEMI 10/30/16 & CABG x 4 01/10/18; HTN; cervicalgia; carpal tunnel syndrome s/p CTR 02/12/16; GERD; bronchitis    Examination-Activity Limitations  Carry;Lift;Reach Overhead;Sleep    Examination-Participation Restrictions  Laundry;Shop;Cleaning    Rehab Potential  Good    PT Frequency  1x / week    PT Duration  4 weeks   4-6 weeks   PT Treatment/Interventions  ADLs/Self Care Home Management;Cryotherapy;Electrical Stimulation;Iontophoresis 53m/ml Dexamethasone;Moist Heat;Ultrasound;Functional mobility training;Therapeutic activities;Therapeutic exercise;Neuromuscular re-education;Patient/family education;Manual techniques;Scar mobilization;Passive range of motion;Dry needling;Taping;Vasopneumatic Device;Spinal Manipulations;Joint Manipulations    PT Next Visit Plan  postural stretching and strengthening; manual therapy including STM/MFR, DN &/or taping for muscle spams/tightness; modalities PRN; HEP review & update as indicated; VS check PRN    PT Home Exercise Plan  04/02/19 - 3-way doorway pec stretch (high position deferred as of 04/10/19 - re-instated 06/05/19), reverse shoulder rolls, scap retraction, cervical retraction; 04/12/19 - UT, LS & SCM stretches, yellow (progressed to red as of 05/01/19 & green as of 06/15/19) TB rows & retraction; 05/08/19 - UE brachial plexus nerve  glide; 05/24/19 - hooklying scapular retraction exercises with red TB; 06/22/19 - standing versions of scapular retraction (horiz ABD, ER & diagonals) with red TB, rhomboid stretch; 06/29/19 - radial nerve glide, UE PNF patterns with yellow TB; 07/10/19 - I's/T's/W's over 65cm Pball, lower chest/abdominal stretch over Pball, 3-way prayer stretch with Pball    Consulted and Agree with Plan of Care  Patient       Patient will benefit from skilled therapeutic intervention in order to improve the following deficits and impairments:  Cardiopulmonary status limiting  activity, Decreased activity tolerance, Decreased mobility, Decreased range of motion, Decreased scar mobility, Decreased strength, Hypomobility, Increased fascial restricitons, Increased muscle spasms, Impaired perceived functional ability, Impaired flexibility, Impaired UE functional use, Improper body mechanics, Postural dysfunction, Pain  Visit Diagnosis: Other symptoms and signs involving the musculoskeletal system - Plan: PT plan of care cert/re-cert  Cervicalgia - Plan: PT plan of care cert/re-cert  Abnormal posture - Plan: PT plan of care cert/re-cert  Muscle weakness (generalized) - Plan: PT plan of care cert/re-cert     Problem List Patient Active Problem List   Diagnosis Date Noted  . Atypical chest pain 10/24/2018  . S/P CABG x 4 01/10/2018  . Hyperlipidemia 01/09/2018  . Unstable angina ([place]) 01/05/2018  . CAD (coronary artery disease), native coronary artery 12/28/2017  . Asymptomatic microscopic hematuria 11/14/2017  . Iron deficiency anemia 09/05/2017  . Cervicalgia 12/18/2015  . Benign essential HTN 04/08/2015  . Metatarsalgia of right foot 02/11/2015    Percival Spanish, PT, MPT 07/25/2019, 12:35 PM  [facility] [address]  [place], [place], [address] Phone: [phone]   Fax:  [phone]  Name: Diana [name] MRN: [id number] Date of Birth: 1956-02-01

## 2019-07-26 ENCOUNTER — Encounter: Payer: Medicaid Other | Admitting: Physical Therapy

## 2019-07-30 DIAGNOSIS — D509 Iron deficiency anemia, unspecified: Secondary | ICD-10-CM | POA: Diagnosis not present

## 2019-07-30 DIAGNOSIS — E875 Hyperkalemia: Secondary | ICD-10-CM | POA: Diagnosis not present

## 2019-07-30 DIAGNOSIS — Z79899 Other long term (current) drug therapy: Secondary | ICD-10-CM | POA: Diagnosis not present

## 2019-07-30 DIAGNOSIS — I10 Essential (primary) hypertension: Secondary | ICD-10-CM | POA: Diagnosis not present

## 2019-08-02 ENCOUNTER — Ambulatory Visit: Payer: Medicaid Other | Attending: Cardiology | Admitting: Physical Therapy

## 2019-08-02 ENCOUNTER — Other Ambulatory Visit: Payer: Self-pay

## 2019-08-02 ENCOUNTER — Encounter: Payer: Self-pay | Admitting: Physical Therapy

## 2019-08-02 DIAGNOSIS — R29898 Other symptoms and signs involving the musculoskeletal system: Secondary | ICD-10-CM | POA: Diagnosis not present

## 2019-08-02 DIAGNOSIS — R293 Abnormal posture: Secondary | ICD-10-CM | POA: Diagnosis not present

## 2019-08-02 DIAGNOSIS — M6281 Muscle weakness (generalized): Secondary | ICD-10-CM | POA: Diagnosis not present

## 2019-08-02 DIAGNOSIS — M542 Cervicalgia: Secondary | ICD-10-CM

## 2019-08-02 NOTE — Therapy (Signed)
[facility] [address]  [place], [place], [address] Phone: [phone]   Fax:  [phone]  Physical Therapy Treatment  Patient Details  Name: Diana [name] MRN: [id number] Date of Birth: 06-04-1955 Referring Provider (PT): Jenne Campus, MD   Encounter Date: 08/02/2019  PT End of Session - 08/02/19 0846    Visit Number  24    Number of Visits  27    Date for PT Re-Evaluation  08/22/19    Authorization Type  Medicaid    Authorization Time Period  08/02/19 - 08/29/19    Authorization - Visit Number  1    Authorization - Number of Visits  4    PT Start Time  0846    PT Stop Time  1000    PT Time Calculation (min)  74 min    Activity Tolerance  Patient tolerated treatment well    Behavior During Therapy  [facility] for tasks assessed/performed       Past Medical History:  Diagnosis Date  . Allergy   . Arthritis    feet  . Bronchitis 10/30/2016  . Carpal tunnel syndrome   . Coronary artery disease   . GERD (gastroesophageal reflux disease)   . Hyperlipidemia   . Hypertension   . Non-ST elevation (NSTEMI) myocardial infarction ([place]) 10/30/2016  . NSTEMI (non-ST elevated myocardial infarction) ([place]) 10/30/2016    Past Surgical History:  Procedure Laterality Date  . CARPAL TUNNEL RELEASE Left 02/12/2016   Procedure: LEFT CARPAL TUNNEL RELEASE;  Surgeon: Daryll Brod, MD;  Location: [place];  Service: Orthopedics;  Laterality: Left;  FAB  . CORONARY ARTERY BYPASS GRAFT N/A 01/10/2018   Procedure: CORONARY ARTERY BYPASS GRAFTING (CABG) TIMES 4, USING LEFT INTERNAL MAMMARY ARTERY TO OM1  AND ENDOSCOPICALLY HARVESTED RIGHT SAPHENOUS VEIN TO DIAGONAL, AND SEQUENTIALLY TO OM2 AND DISTAL CIRCUMFLEX;  Surgeon: Grace Isaac, MD;  Location: [facility];  Service: Open Heart Surgery;  Laterality: N/A;  . LEFT HEART CATH AND CORONARY ANGIOGRAPHY N/A 01/05/2018   Procedure: LEFT HEART CATH AND CORONARY ANGIOGRAPHY;   Surgeon: Troy Sine, MD;  Location: [place] [facility];  Service: Cardiovascular;  Laterality: N/A;  . TEE WITHOUT CARDIOVERSION N/A 01/10/2018   Procedure: TRANSESOPHAGEAL ECHOCARDIOGRAM (TEE);  Surgeon: Grace Isaac, MD;  Location: [place];  Service: Open Heart Surgery;  Laterality: N/A;  . TONSILLECTOMY    . TUBAL LIGATION    . WRIST SURGERY Right     There were no vitals filed for this visit.  Subjective Assessment - 08/02/19 0852    Subjective  Pt reporting her blood pressure has been running low so her PCP stopped a few of her of her meds and changed the dose on her iron as of her visit on Monday. Also notes she has had a death in the family. She reports the heaviness in her chest has been getting worse since te weekend.    Pertinent History  CABG x 4 - 01/10/18    Patient Stated Goals  "to have the pressure in my chest relieved"    Currently in Pain?  Yes    Pain Score  5     Pain Location  Chest    Pain Orientation  Left;Anterior;Medial    Pain Descriptors / Indicators  Heaviness    Pain Type  Chronic pain    Pain Radiating Towards  wrapping around ribs to L scapula    Pain Frequency  Constant    Pain Score  5    Pain Location  Neck   & upper shoulder   Pain Orientation  Left    Pain Descriptors / Indicators  Sore    Pain Type  Chronic pain    Pain Frequency  Constant                       [facility] Adult PT Treatment/Exercise - 08/02/19 0846      Shoulder Exercises: ROM/Strengthening   UBE (Upper Arm Bike)  L2.5 x 6 min (3' fwd/3' back)      Modalities   Modalities  Electrical Stimulation;Moist Heat      Moist Heat Therapy   Number Minutes Moist Heat  15 Minutes    Moist Heat Location  Cervical;Shoulder   B periscapular region & anterior chest     Electrical Stimulation   Electrical Stimulation Location  L shoulder complex    Electrical Stimulation Action  IFC    Electrical Stimulation Parameters  80-150 Hz, intensity to pt tolerance x 15'     Electrical Stimulation Goals  Pain;Tone      Manual Therapy   Manual Therapy  Soft tissue mobilization;Myofascial release;Scapular mobilization;Passive ROM    Manual therapy comments  supine & R side lying    Soft tissue mobilization  STM to B lower cervical paraspinals, UT/LS and periscapular muscles (L>R); gentle STM to L medial pec major/minor & sternal incision    Myofascial Release  manual TPR to L UT/LS & rhomboids; pin and stretch to L UT/LS; L subscapular release    Scapular Mobilization  L scapular mobs - all directions    Passive ROM  manual gentle B pec stretch with overpressure into scap retraction & depression for UT/LS stretch      Kinesiotix   Create Space  30% along sternum from xiphoid process to sternal notch + 30% across L pec & downward along medial breast/lateral sternum   Light blue Kinesio Tex Gold Light Touch + (sensitive skin)   Inhibit Muscle   B UT & LS/thoracic paraspinals 30%    blue KinesioTex tape            PT Education - 08/02/19 0945    Education Details  Deep breathing as a relaxation technique    Person(s) Educated  Patient    Methods  Explanation;Demonstration;Handout    Comprehension  Verbalized understanding;Need further instruction       PT Short Term Goals - 05/11/19 0810      PT SHORT TERM GOAL #1   Title  Patient will be independent with initial HEP    Status  Achieved   04/16/19     PT SHORT TERM GOAL #2   Title  Patient to demonstrate understanding of good posture    Status  Achieved   04/16/2019     PT SHORT TERM GOAL #3   Title  Patient to report pain reduction in frequency and intensity by >/= 25%    Status  Achieved   30% improved as of 05/08/19       PT Long Term Goals - 07/25/19 0822      PT LONG TERM GOAL #1   Title  Patient will be independent with ongoing/advanced HEP    Status  Partially Met    Target Date  08/22/19      PT LONG TERM GOAL #2   Title  Patient to demonstrate appropriate posture and body  mechanics needed  for daily activities    Status  Achieved   05/22/19     PT LONG TERM GOAL #3   Title  Patient to report pain reduction in frequency and intensity by >/= 75%    Status  Partially Met   07/25/19 - 70% improved   Target Date  08/22/19      PT LONG TERM GOAL #4   Title  Patient to improve cervical and B shoulder AROM to WFL/WNL without pain provocation    Status  Partially Met    Target Date  08/22/19      PT LONG TERM GOAL #5   Title  Patient to report ability to sleep as well as perform ADLs and household tasks without pain interference    Status  Partially Met    Target Date  08/22/19            Plan - 08/02/19 0945    Clinical Impression Statement  Jalesa reports her pain/heaviness in her chest has been getting worse since the weekend, but notes increasing personal stress due to her living situation (no longer staying with her mother and does not have a current permanent residence) and a recent death in the family. She saw her PCP on Monday who made a few changes in her meds and has referred her to talk with a psychologist. Treatment session today focusing on pain management with manual therapy, stretching and joint mobilization to reduce muscle tension/tightness followed by taping (as patient continues to note benefit) and estim with moist heat (as patient has been unable to use her home TENS unit as she cannot reach the places where she needs to place the electrodes w/o assistance and is no longer living with her mother who had previously assisted her). Patient reporting pain reduced by end of session and was encouraged to continue to try to use the relaxation techniques provided last session as well as deep breathing for relaxation in addition to her ongoing HEP.    Personal Factors and Comorbidities  Comorbidity 3+;Time since onset of injury/illness/exacerbation;Past/Current Experience    Comorbidities  CAD s/p NSTEMI 10/30/16 & CABG x 4 01/10/18; HTN; cervicalgia;  carpal tunnel syndrome s/p CTR 02/12/16; GERD; bronchitis    Examination-Activity Limitations  Carry;Lift;Reach Overhead;Sleep    Examination-Participation Restrictions  Laundry;Shop;Cleaning    Rehab Potential  Good    PT Frequency  1x / week    PT Duration  4 weeks   4-6 weeks   PT Treatment/Interventions  ADLs/Self Care Home Management;Cryotherapy;Electrical Stimulation;Iontophoresis '4mg'$ /ml Dexamethasone;Moist Heat;Ultrasound;Functional mobility training;Therapeutic activities;Therapeutic exercise;Neuromuscular re-education;Patient/family education;Manual techniques;Scar mobilization;Passive range of motion;Dry needling;Taping;Vasopneumatic Device;Spinal Manipulations;Joint Manipulations    PT Next Visit Plan  postural stretching and strengthening; manual therapy including STM/MFR, DN &/or taping for muscle spams/tightness; modalities PRN; HEP review & update as indicated; VS check PRN    PT Home Exercise Plan  04/02/19 - 3-way doorway pec stretch (high position deferred as of 04/10/19 - re-instated 06/05/19), reverse shoulder rolls, scap retraction, cervical retraction; 04/12/19 - UT, LS & SCM stretches, yellow (progressed to red as of 05/01/19 & green as of 06/15/19) TB rows & retraction; 05/08/19 - UE brachial plexus nerve glide; 05/24/19 - hooklying scapular retraction exercises with red TB; 06/22/19 - standing versions of scapular retraction (horiz ABD, ER & diagonals) with red TB, rhomboid stretch; 06/29/19 - radial nerve glide, UE PNF patterns with yellow TB; 07/10/19 - I's/T's/W's over 65cm Pball, lower chest/abdominal stretch over Pball, 3-way prayer stretch with Pball    Consulted  and Agree with Plan of Care  Patient       Patient will benefit from skilled therapeutic intervention in order to improve the following deficits and impairments:  Cardiopulmonary status limiting activity, Decreased activity tolerance, Decreased mobility, Decreased range of motion, Decreased scar mobility, Decreased  strength, Hypomobility, Increased fascial restricitons, Increased muscle spasms, Impaired perceived functional ability, Impaired flexibility, Impaired UE functional use, Improper body mechanics, Postural dysfunction, Pain  Visit Diagnosis: Other symptoms and signs involving the musculoskeletal system  Cervicalgia  Abnormal posture  Muscle weakness (generalized)     Problem List Patient Active Problem List   Diagnosis Date Noted  . Atypical chest pain 10/24/2018  . S/P CABG x 4 01/10/2018  . Hyperlipidemia 01/09/2018  . Unstable angina ([place]) 01/05/2018  . CAD (coronary artery disease), native coronary artery 12/28/2017  . Asymptomatic microscopic hematuria 11/14/2017  . Iron deficiency anemia 09/05/2017  . Cervicalgia 12/18/2015  . Benign essential HTN 04/08/2015  . Metatarsalgia of right foot 02/11/2015    Percival Spanish, PT, MPT 08/02/2019, 12:24 PM  [facility] [address]  [place], [place], [address] Phone: [phone]   Fax:  [phone]  Name: LEAANN NEVILS MRN: [id number] Date of Birth: 10/23/1955

## 2019-08-08 ENCOUNTER — Ambulatory Visit: Payer: Medicaid Other | Admitting: Physical Therapy

## 2019-08-15 ENCOUNTER — Other Ambulatory Visit: Payer: Self-pay

## 2019-08-15 ENCOUNTER — Encounter: Payer: Self-pay | Admitting: Physical Therapy

## 2019-08-15 ENCOUNTER — Ambulatory Visit: Payer: Medicaid Other | Admitting: Physical Therapy

## 2019-08-15 DIAGNOSIS — M542 Cervicalgia: Secondary | ICD-10-CM | POA: Diagnosis not present

## 2019-08-15 DIAGNOSIS — R29898 Other symptoms and signs involving the musculoskeletal system: Secondary | ICD-10-CM

## 2019-08-15 DIAGNOSIS — M6281 Muscle weakness (generalized): Secondary | ICD-10-CM

## 2019-08-15 DIAGNOSIS — R293 Abnormal posture: Secondary | ICD-10-CM | POA: Diagnosis not present

## 2019-08-15 NOTE — Patient Instructions (Signed)
    Home exercise program created by Paulina Muchmore, PT.  For questions, please contact Arnisha Laffoon via phone at [phone] or email at [email or web address]  [facility] [address]  [address] [place], [place], [address] Phone: [phone]   Fax:  [phone]    

## 2019-08-15 NOTE — Therapy (Signed)
[facility] [address]  [place], [place], [address] Phone: [phone]   Fax:  [phone]  Physical Therapy Treatment  Patient Details  Name: Diana [name] MRN: [id number] Date of Birth: [birth year] Referring Provider (PT): Jenne Campus, MD   Encounter Date: 08/15/2019  PT End of Session - 08/15/19 0802    Visit Number  25    Number of Visits  27    Date for PT Re-Evaluation  08/22/19    Authorization Type  Medicaid    Authorization Time Period  08/02/19 - 08/29/19    Authorization - Visit Number  2    Authorization - Number of Visits  4    PT Start Time  0802    PT Stop Time  0918    PT Time Calculation (min)  76 min    Activity Tolerance  Patient tolerated treatment well    Behavior During Therapy  [facility] for tasks assessed/performed       Past Medical History:  Diagnosis Date  . Allergy   . Arthritis    feet  . Bronchitis 10/30/2016  . Carpal tunnel syndrome   . Coronary artery disease   . GERD (gastroesophageal reflux disease)   . Hyperlipidemia   . Hypertension   . Non-ST elevation (NSTEMI) myocardial infarction ([place]) 10/30/2016  . NSTEMI (non-ST elevated myocardial infarction) ([place]) 10/30/2016    Past Surgical History:  Procedure Laterality Date  . CARPAL TUNNEL RELEASE Left 02/12/2016   Procedure: LEFT CARPAL TUNNEL RELEASE;  Surgeon: Daryll Brod, MD;  Location: [place];  Service: Orthopedics;  Laterality: Left;  FAB  . CORONARY ARTERY BYPASS GRAFT N/A 01/10/2018   Procedure: CORONARY ARTERY BYPASS GRAFTING (CABG) TIMES 4, USING LEFT INTERNAL MAMMARY ARTERY TO OM1  AND ENDOSCOPICALLY HARVESTED RIGHT SAPHENOUS VEIN TO DIAGONAL, AND SEQUENTIALLY TO OM2 AND DISTAL CIRCUMFLEX;  Surgeon: Grace Isaac, MD;  Location: [place];  Service: Open Heart Surgery;  Laterality: N/A;  . LEFT HEART CATH AND CORONARY ANGIOGRAPHY N/A 01/05/2018   Procedure: LEFT HEART CATH AND CORONARY ANGIOGRAPHY;   Surgeon: Troy Sine, MD;  Location: [place] CV LAB;  Service: Cardiovascular;  Laterality: N/A;  . TEE WITHOUT CARDIOVERSION N/A 01/10/2018   Procedure: TRANSESOPHAGEAL ECHOCARDIOGRAM (TEE);  Surgeon: Grace Isaac, MD;  Location: [place];  Service: Open Heart Surgery;  Laterality: N/A;  . TONSILLECTOMY    . TUBAL LIGATION    . WRIST SURGERY Right     There were no vitals filed for this visit.  Subjective Assessment - 08/15/19 0804    Subjective  Pt reports her shoulder pain is a little better but her chest is still more painful.    Pertinent History  CABG x 4 - 01/10/18    Patient Stated Goals  "to have the pressure in my chest relieved"    Currently in Pain?  Yes    Pain Score  5     Pain Location  Chest    Pain Orientation  Left;Anterior;Medial    Pain Descriptors / Indicators  Heaviness    Pain Type  Chronic pain    Pain Frequency  Constant    Pain Score  2    Pain Location  Shoulder    Pain Orientation  Left    Pain Descriptors / Indicators  Sore    Pain Type  Chronic pain    Pain Frequency  Intermittent  [facility] Adult PT Treatment/Exercise - 08/15/19 0802      Neck Exercises: Theraband   Shoulder External Rotation  10 reps   yellow TB   Shoulder External Rotation Limitations  hooklying on pool noodle - cues for scap retraction into noodle    Horizontal ABduction  10 reps   yellow TB   Horizontal ABduction Limitations  hooklying on pool noodle - cues for scap retraction into noodle    Other Theraband Exercises  Alt UE diagonals + scap retraction with yellow TB x 10; hooklying on pool noodle      Shoulder Exercises: Standing   Horizontal ABduction  Both;10 reps;Theraband;Strengthening    Theraband Level (Shoulder Horizontal ABduction)  Level 1 (Yellow)    Horizontal ABduction Limitations  standing against pool noodle on wall    External Rotation  Both;10 reps;Theraband;Strengthening    Theraband Level (Shoulder External  Rotation)  Level 1 (Yellow)    External Rotation Limitations  standing against pool noodle on wall    Row  Both;10 reps;Theraband;Strengthening    Theraband Level (Shoulder Row)  Level 2 (Red)    Row Limitations  cues to tuck elbows into ribs + scap retraction as well as cues to avoid shoulder shrug    Retraction  Both;10 reps;Theraband;Strengthening    Theraband Level (Shoulder Retraction)  Level 2 (Red)    Retraction Limitations  scap retraction/depression + mini-shoulder extension; cues to avoid shoulder hike    Diagonals  Both;10 reps;Theraband;Strengthening    Theraband Level (Shoulder Diagonals)  Level 1 (Yellow)    Diagonals Limitations  standing against pool noodle on wall      Shoulder Exercises: IT sales professional  30 seconds;2 reps   Advertising copywriter Limitations  3-way doorway B pec stretch + L only stretch for mid position    Other Shoulder Stretches  B rhomboid stretch holding onto doorframe 2 x 30 sec      Manual Therapy   Manual Therapy  Taping      Kinesiotix   Create Space  30% along sternum from xiphoid process to sternal notch + 30% across L pec & downward along medial breast/lateral sternum   Light blue Kinesio Tex Gold Light Touch + (sensitive skin)   Inhibit Muscle   B UT 30%    blue KinesioTex tape     Neck Exercises: Stretches   Upper Trapezius Stretch  Left;30 seconds;1 rep    Upper Trapezius Stretch Limitations  hand anchored on edge of seat + slight overpressure    Levator Stretch  Left;30 seconds;1 rep    Levator Stretch Limitations  hand anchored on edge of seat + slight overpressure    Chest Stretch  30 seconds;2 reps    Chest Stretch Limitations  snow angel pec stretch over pool noodle    Other Neck Stretches  R/L SCM & scalene stretches 2 x 30 sec             PT Education - 08/15/19 0910    Education Details  HEP review & consolidation    Person(s) Educated  Patient    Methods  Explanation;Demonstration;Handout     Comprehension  Verbalized understanding;Returned demonstration       PT Short Term Goals - 05/11/19 0810      PT SHORT TERM GOAL #1   Title  Patient will be independent with initial HEP    Status  Achieved   04/16/19     PT SHORT TERM GOAL #2  Title  Patient to demonstrate understanding of good posture    Status  Achieved   04/16/2019     PT SHORT TERM GOAL #3   Title  Patient to report pain reduction in frequency and intensity by >/= 25%    Status  Achieved   30% improved as of 05/08/19       PT Long Term Goals - 07/25/19 0822      PT LONG TERM GOAL #1   Title  Patient will be independent with ongoing/advanced HEP    Status  Partially Met    Target Date  08/22/19      PT LONG TERM GOAL #2   Title  Patient to demonstrate appropriate posture and body mechanics needed for daily activities    Status  Achieved   05/22/19     PT LONG TERM GOAL #3   Title  Patient to report pain reduction in frequency and intensity by >/= 75%    Status  Partially Met   07/25/19 - 70% improved   Target Date  08/22/19      PT LONG TERM GOAL #4   Title  Patient to improve cervical and B shoulder AROM to WFL/WNL without pain provocation    Status  Partially Met    Target Date  08/22/19      PT LONG TERM GOAL #5   Title  Patient to report ability to sleep as well as perform ADLs and household tasks without pain interference    Status  Partially Met    Target Date  08/22/19            Plan - 08/15/19 0807    Clinical Impression Statement  Rya reports shoulder pain is improving but chest pain remains elevated. She feels like breathing relaxation techniques provided are helping. Initiated comprehensive HEP review and consolidation adjusting exercises and resistance levels to address ongoing issues and current tolerance with revised HEP handout provided. Patient admitting to limited performance of some of the stretches and exercises that would be most beneficial. Patient also noting she  purchased a physioball, therefore will plan to review these exercises and update as appropriate on final visit next week. Kinesiotape reapplied at patient request as she continues to note benefit from this. She plans to purchase tape, therefore will provide instruction in application next visit. Anticipate she will be ready to transition to her HEP as planned on final visit next week.    Personal Factors and Comorbidities  Comorbidity 3+;Time since onset of injury/illness/exacerbation;Past/Current Experience    Comorbidities  CAD s/p NSTEMI 10/30/16 & CABG x 4 01/10/18; HTN; cervicalgia; carpal tunnel syndrome s/p CTR 02/12/16; GERD; bronchitis    Examination-Activity Limitations  Carry;Lift;Reach Overhead;Sleep    Examination-Participation Restrictions  Laundry;Shop;Cleaning    Rehab Potential  Good    PT Frequency  1x / week    PT Duration  4 weeks   4-6 weeks   PT Treatment/Interventions  ADLs/Self Care Home Management;Cryotherapy;Electrical Stimulation;Iontophoresis 39m/ml Dexamethasone;Moist Heat;Ultrasound;Functional mobility training;Therapeutic activities;Therapeutic exercise;Neuromuscular re-education;Patient/family education;Manual techniques;Scar mobilization;Passive range of motion;Dry needling;Taping;Vasopneumatic Device;Spinal Manipulations;Joint Manipulations    PT Next Visit Plan  postural stretching and strengthening; manual therapy including STM/MFR, DN &/or taping for muscle spams/tightness; modalities PRN; HEP review & update as indicated; VS check PRN    PT Home Exercise Plan  04/02/19 - 3-way doorway pec stretch (high position deferred as of 04/10/19 - re-instated 06/05/19), reverse shoulder rolls, scap retraction, cervical retraction; 04/12/19 - UT, LS & SCM stretches, yellow (progressed  to red as of 05/01/19 & green as of 06/15/19) TB rows & retraction; 05/08/19 - UE brachial plexus nerve glide; 05/24/19 - hooklying scapular retraction exercises with red TB; 06/22/19 - standing versions of  scapular retraction (horiz ABD, ER & diagonals) with red TB, rhomboid stretch; 06/29/19 - radial nerve glide, UE PNF patterns with yellow TB; 07/10/19 - I's/T's/W's over 65cm Pball, lower chest/abdominal stretch over Pball, 3-way prayer stretch with Pball    Consulted and Agree with Plan of Care  Patient       Patient will benefit from skilled therapeutic intervention in order to improve the following deficits and impairments:  Cardiopulmonary status limiting activity, Decreased activity tolerance, Decreased mobility, Decreased range of motion, Decreased scar mobility, Decreased strength, Hypomobility, Increased fascial restricitons, Increased muscle spasms, Impaired perceived functional ability, Impaired flexibility, Impaired UE functional use, Improper body mechanics, Postural dysfunction, Pain  Visit Diagnosis: Other symptoms and signs involving the musculoskeletal system  Cervicalgia  Abnormal posture  Muscle weakness (generalized)     Problem List Patient Active Problem List   Diagnosis Date Noted  . Atypical chest pain 10/24/2018  . S/P CABG x 4 01/10/2018  . Hyperlipidemia 01/09/2018  . Unstable angina ([place]) 01/05/2018  . CAD (coronary artery disease), native coronary artery 12/28/2017  . Asymptomatic microscopic hematuria 11/14/2017  . Iron deficiency anemia 09/05/2017  . Cervicalgia 12/18/2015  . Benign essential HTN 04/08/2015  . Metatarsalgia of right foot 02/11/2015    Percival Spanish, PT, MPT 08/15/2019, 1:26 PM  [facility] [address]  [place], [place], [address] Phone: [phone]   Fax:  [phone]  Name: ZEEVA COURSER MRN: [id number] Date of Birth: 06-07-1955

## 2019-08-17 DIAGNOSIS — I25118 Atherosclerotic heart disease of native coronary artery with other forms of angina pectoris: Secondary | ICD-10-CM | POA: Diagnosis not present

## 2019-08-17 DIAGNOSIS — I10 Essential (primary) hypertension: Secondary | ICD-10-CM | POA: Diagnosis not present

## 2019-08-17 DIAGNOSIS — M7741 Metatarsalgia, right foot: Secondary | ICD-10-CM | POA: Diagnosis not present

## 2019-08-17 DIAGNOSIS — J301 Allergic rhinitis due to pollen: Secondary | ICD-10-CM | POA: Diagnosis not present

## 2019-08-17 DIAGNOSIS — G4700 Insomnia, unspecified: Secondary | ICD-10-CM | POA: Diagnosis not present

## 2019-08-17 DIAGNOSIS — R1310 Dysphagia, unspecified: Secondary | ICD-10-CM | POA: Diagnosis not present

## 2019-08-17 DIAGNOSIS — G8918 Other acute postprocedural pain: Secondary | ICD-10-CM | POA: Diagnosis not present

## 2019-08-17 DIAGNOSIS — R0789 Other chest pain: Secondary | ICD-10-CM | POA: Diagnosis not present

## 2019-08-17 DIAGNOSIS — D509 Iron deficiency anemia, unspecified: Secondary | ICD-10-CM | POA: Diagnosis not present

## 2019-08-22 ENCOUNTER — Ambulatory Visit: Payer: Medicaid Other | Admitting: Physical Therapy

## 2019-08-22 ENCOUNTER — Other Ambulatory Visit: Payer: Self-pay

## 2019-08-22 DIAGNOSIS — R29898 Other symptoms and signs involving the musculoskeletal system: Secondary | ICD-10-CM | POA: Diagnosis not present

## 2019-08-22 DIAGNOSIS — M6281 Muscle weakness (generalized): Secondary | ICD-10-CM | POA: Diagnosis not present

## 2019-08-22 DIAGNOSIS — R293 Abnormal posture: Secondary | ICD-10-CM

## 2019-08-22 DIAGNOSIS — M542 Cervicalgia: Secondary | ICD-10-CM | POA: Diagnosis not present

## 2019-08-22 NOTE — Therapy (Addendum)
[facility] [address]  [place], [place], [address] Phone: [phone]   Fax:  [phone]  Physical Therapy Treatment / Discharge Summary  Patient Details  Name: Diana [name] MRN: [id number] Date of Birth: [birth year] Referring Provider (PT): Jenne Campus, MD   Encounter Date: 08/22/2019  PT End of Session - 08/22/19 0846    Visit Number  26    Number of Visits  27    Date for PT Re-Evaluation  08/22/19    Authorization Type  Medicaid    Authorization Time Period  08/02/19 - 08/29/19    Authorization - Visit Number  3    Authorization - Number of Visits  4    PT Start Time  0846    PT Stop Time  0930    PT Time Calculation (min)  44 min    Activity Tolerance  Patient tolerated treatment well    Behavior During Therapy  [facility] for tasks assessed/performed       Past Medical History:  Diagnosis Date  . Allergy   . Arthritis    feet  . Bronchitis 10/30/2016  . Carpal tunnel syndrome   . Coronary artery disease   . GERD (gastroesophageal reflux disease)   . Hyperlipidemia   . Hypertension   . Non-ST elevation (NSTEMI) myocardial infarction ([place]) 10/30/2016  . NSTEMI (non-ST elevated myocardial infarction) ([place]) 10/30/2016    Past Surgical History:  Procedure Laterality Date  . CARPAL TUNNEL RELEASE Left 02/12/2016   Procedure: LEFT CARPAL TUNNEL RELEASE;  Surgeon: Daryll Brod, MD;  Location: [place];  Service: Orthopedics;  Laterality: Left;  FAB  . CORONARY ARTERY BYPASS GRAFT N/A 01/10/2018   Procedure: CORONARY ARTERY BYPASS GRAFTING (CABG) TIMES 4, USING LEFT INTERNAL MAMMARY ARTERY TO OM1  AND ENDOSCOPICALLY HARVESTED RIGHT SAPHENOUS VEIN TO DIAGONAL, AND SEQUENTIALLY TO OM2 AND DISTAL CIRCUMFLEX;  Surgeon: Grace Isaac, MD;  Location: [facility];  Service: Open Heart Surgery;  Laterality: N/A;  . LEFT HEART CATH AND CORONARY ANGIOGRAPHY N/A 01/05/2018   Procedure: LEFT HEART CATH AND  CORONARY ANGIOGRAPHY;  Surgeon: Troy Sine, MD;  Location: [place] CV LAB;  Service: Cardiovascular;  Laterality: N/A;  . TEE WITHOUT CARDIOVERSION N/A 01/10/2018   Procedure: TRANSESOPHAGEAL ECHOCARDIOGRAM (TEE);  Surgeon: Grace Isaac, MD;  Location: [facility];  Service: Open Heart Surgery;  Laterality: N/A;  . TONSILLECTOMY    . TUBAL LIGATION    . WRIST SURGERY Right     There were no vitals filed for this visit.  Subjective Assessment - 08/22/19 0849    Subjective  Pt reports she has all the equipment she needs to keep up with the exercise at home (swiss ball, pool noodle, therabands) plus the kinesiotape and TENS unit for pain management PRN.    Pertinent History  CABG x 4 - 01/10/18    Patient Stated Goals  "to have the pressure in my chest relieved"    Currently in Pain?  Yes    Pain Score  2     Pain Location  Chest    Pain Orientation  Left;Anterior;Medial    Pain Descriptors / Indicators  Heaviness    Pain Type  Chronic pain    Pain Frequency  Constant    Pain Score  0    Pain Location  Shoulder    Pain Orientation  Left         [facility] PT Assessment - 08/22/19  0846      Assessment   Medical Diagnosis  Atypical chest pain    Referring Provider (PT)  Jenne Campus, MD    Onset Date/Surgical Date  --   Fall 2019   Hand Dominance  Right      AROM   Right Shoulder Flexion  152 Degrees    Right Shoulder ABduction  162 Degrees    Right Shoulder Internal Rotation  --   FIR to T10   Right Shoulder External Rotation  --   FER to T3   Left Shoulder Flexion  150 Degrees    Left Shoulder ABduction  158 Degrees    Left Shoulder Internal Rotation  --   FIR to T10   Left Shoulder External Rotation  --   FER to T3   Cervical Flexion  58    Cervical Extension  62 - "pulling" in chest    Cervical - Right Side Bend  38    Cervical - Left Side Bend  32    Cervical - Right Rotation  78    Cervical - Left Rotation  66      Strength   Right Shoulder Flexion   5/5    Right Shoulder ABduction  5/5    Right Shoulder Internal Rotation  5/5    Right Shoulder External Rotation  4+/5    Left Shoulder Flexion  5/5    Left Shoulder ABduction  5/5    Left Shoulder Internal Rotation  5/5    Left Shoulder External Rotation  4+/5                   [facility] Adult PT Treatment/Exercise - 08/22/19 0846      Self-Care   Self-Care  Scar Mobilizations    Scar Mobilizations  Reviewed scar mobilization for sternal incision from open heart surgery.      Shoulder Exercises: ROM/Strengthening   UBE (Upper Arm Bike)  L2.5 x 6 min (3' fwd/3' back)             PT Education - 08/22/19 0930    Education Details  Scar massage/mobilization for sternal incision to reduce "pulliing" with cervical motion; final clarification of HEP    Person(s) Educated  Patient    Methods  Explanation;Demonstration;Handout    Comprehension  Verbalized understanding;Returned demonstration       PT Short Term Goals - 05/11/19 0810      PT SHORT TERM GOAL #1   Title  Patient will be independent with initial HEP    Status  Achieved   04/16/19     PT SHORT TERM GOAL #2   Title  Patient to demonstrate understanding of good posture    Status  Achieved   04/16/2019     PT SHORT TERM GOAL #3   Title  Patient to report pain reduction in frequency and intensity by >/= 25%    Status  Achieved   30% improved as of 05/08/19       PT Long Term Goals - 08/22/19 0856      PT LONG TERM GOAL #1   Title  Patient will be independent with ongoing/advanced HEP    Status  Achieved   08/22/19     PT LONG TERM GOAL #2   Title  Patient to demonstrate appropriate posture and body mechanics needed for daily activities    Status  Achieved   05/22/19     PT LONG TERM GOAL #3   Title  Patient to report pain reduction in frequency and intensity by >/= 75%    Status  Achieved   08/22/19 - 75% improved     PT LONG TERM GOAL #4   Title  Patient to improve cervical and B  shoulder AROM to WFL/WNL without pain provocation    Status  Achieved   08/22/19     PT LONG TERM GOAL #5   Title  Patient to report ability to sleep as well as perform ADLs and household tasks without pain interference    Status  Achieved   08/22/19           Plan - 08/22/19 0855    Personal Factors and Comorbidities  Comorbidity 3+;Time since onset of injury/illness/exacerbation;Past/Current Experience    Comorbidities  CAD s/p NSTEMI 10/30/16 & CABG x 4 01/10/18; HTN; cervicalgia; carpal tunnel syndrome s/p CTR 02/12/16; GERD; bronchitis    Examination-Activity Limitations  Carry;Lift;Reach Overhead;Sleep    Examination-Participation Restrictions  Laundry;Shop;Cleaning    Rehab Potential  Good    PT Frequency  1x / week    PT Duration  4 weeks   4-6 weeks   PT Treatment/Interventions  ADLs/Self Care Home Management;Cryotherapy;Electrical Stimulation;Iontophoresis 2m/ml Dexamethasone;Moist Heat;Ultrasound;Functional mobility training;Therapeutic activities;Therapeutic exercise;Neuromuscular re-education;Patient/family education;Manual techniques;Scar mobilization;Passive range of motion;Dry needling;Taping;Vasopneumatic Device;Spinal Manipulations;Joint Manipulations    PT Next Visit Plan  30-day hold    PT Home Exercise Plan  04/02/19 - 3-way doorway pec stretch (high position deferred as of 04/10/19 - re-instated 06/05/19), reverse shoulder rolls, scap retraction, cervical retraction; 04/12/19 - UT, LS & SCM stretches, yellow (progressed to red as of 05/01/19 & green as of 06/15/19) TB rows & retraction; 05/08/19 - UE brachial plexus nerve glide; 05/24/19 - hooklying scapular retraction exercises with red TB; 06/22/19 - standing versions of scapular retraction (horiz ABD, ER & diagonals) with red TB, rhomboid stretch; 06/29/19 - radial nerve glide, UE PNF patterns with yellow TB; 07/10/19 - I's/T's/W's over 65cm Pball, lower chest/abdominal stretch over Pball, 3-way prayer stretch with Pball     Consulted and Agree with Plan of Care  Patient       Patient will benefit from skilled therapeutic intervention in order to improve the following deficits and impairments:  Cardiopulmonary status limiting activity, Decreased activity tolerance, Decreased mobility, Decreased range of motion, Decreased scar mobility, Decreased strength, Hypomobility, Increased fascial restricitons, Increased muscle spasms, Impaired perceived functional ability, Impaired flexibility, Impaired UE functional use, Improper body mechanics, Postural dysfunction, Pain  Visit Diagnosis: Other symptoms and signs involving the musculoskeletal system  Cervicalgia  Abnormal posture  Muscle weakness (generalized)     Problem List Patient Active Problem List   Diagnosis Date Noted  . Atypical chest pain 10/24/2018  . S/P CABG x 4 01/10/2018  . Hyperlipidemia 01/09/2018  . Unstable angina ([place] 01/05/2018  . CAD (coronary artery disease), native coronary artery 12/28/2017  . Asymptomatic microscopic hematuria 11/14/2017  . Iron deficiency anemia 09/05/2017  . Cervicalgia 12/18/2015  . Benign essential HTN 04/08/2015  . Metatarsalgia of right foot 02/11/2015    JPercival Spanish PT, MPT 08/22/2019, 2:00 PM  [facility] [place] [address]Phone: [phone]  Fax:  [phone] Name: Diana WOHLFARTHMRN: [id number] of Birth: 809/26/1957 PHYSICAL THERAPY DISCHARGE SUMMARY  Visits from Start of Care: 26  Current functional level related to goals / functional outcomes:   Refer to above clinical impression for status as  of last visit on 08/22/2019. Patient was placed on hold for 30 days and has not needed to return to PT, therefore will proceed with discharge from PT for this episode.   Remaining deficits:   As above.   Education / Equipment:   HEP  Plan: Patient agrees to discharge.  Patient goals were met.  Patient is being discharged due to meeting the stated rehab goals.  ?????     Percival Spanish, PT, MPT 10/10/19, 9:30 AM  [facility] [address]  [place], [place], [address] Phone: [phone]   Fax:  [phone]

## 2019-08-24 ENCOUNTER — Other Ambulatory Visit: Payer: Self-pay

## 2019-08-24 ENCOUNTER — Ambulatory Visit: Payer: Medicaid Other | Admitting: Podiatry

## 2019-08-24 ENCOUNTER — Encounter: Payer: Self-pay | Admitting: Podiatry

## 2019-08-24 ENCOUNTER — Ambulatory Visit: Payer: Self-pay

## 2019-08-24 DIAGNOSIS — M2142 Flat foot [pes planus] (acquired), left foot: Secondary | ICD-10-CM

## 2019-08-24 DIAGNOSIS — M79675 Pain in left toe(s): Secondary | ICD-10-CM

## 2019-08-24 DIAGNOSIS — M2141 Flat foot [pes planus] (acquired), right foot: Secondary | ICD-10-CM

## 2019-08-24 DIAGNOSIS — M722 Plantar fascial fibromatosis: Secondary | ICD-10-CM

## 2019-08-24 DIAGNOSIS — M19072 Primary osteoarthritis, left ankle and foot: Secondary | ICD-10-CM

## 2019-08-24 NOTE — Patient Instructions (Signed)

## 2019-08-24 NOTE — Progress Notes (Signed)
Subjective:  Patient ID: Diana [name], female    DOB: 1956/05/09,  MRN: [id number]  Chief Complaint  Patient presents with  . Foot Pain    left foot pain    64 y.o. female presents with the above complaint.  Patient presents with left first metatarsophalangeal joint pain.  Patient states that this has been going on for quite some time now.  Patient had a quadruple bypass back in September 2019 and is recovering from that.  Patient is doing physical therapy for that.  Patient was seen previously long time ago by Dr. Amalia Hailey for the right foot.  However today after the surgery patient is has had constant pain to the left metatarsophalangeal joint.  It is dull achy in nature.  Patient has not tried anything for this.  She has not seen anyone else for this.  She would like to know if there is what other treatment options to treat this.   Review of Systems: Negative except as noted in the HPI. Denies N/V/F/Ch.  Past Medical History:  Diagnosis Date  . Allergy   . Arthritis    feet  . Bronchitis 10/30/2016  . Carpal tunnel syndrome   . Coronary artery disease   . GERD (gastroesophageal reflux disease)   . Hyperlipidemia   . Hypertension   . Non-ST elevation (NSTEMI) myocardial infarction ([place]) 10/30/2016  . NSTEMI (non-ST elevated myocardial infarction) ([place]) 10/30/2016    Current Outpatient Medications:  .  acetaminophen (TYLENOL) 500 MG tablet, Take 500 mg by mouth every 6 (six) hours as needed., Disp: , Rfl:  .  ALPRAZolam (XANAX) 0.5 MG tablet, Take 1 tablet (0.5 mg total) by mouth 2 (two) times daily as needed for anxiety., Disp: 20 tablet, Rfl: 0 .  aspirin EC 81 MG tablet, Take 81 mg by mouth daily., Disp: , Rfl:  .  atorvastatin (LIPITOR) 80 MG tablet, Take 0.5 tablets (40 mg total) by mouth daily at 6 PM., Disp: 60 tablet, Rfl: 1 .  cetirizine (ZYRTEC) 10 MG tablet, Take 10 mg by mouth daily., Disp: , Rfl:  .  DULoxetine (CYMBALTA) 30 MG capsule, Take 30 mg by mouth daily., Disp: ,  Rfl:  .  ferrous gluconate (FERGON) 324 MG tablet, Take 2 tablets by mouth daily. , Disp: , Rfl:  .  hydrochlorothiazide (HYDRODIURIL) 12.5 MG tablet, Take 12.5 mg by mouth daily., Disp: , Rfl:  .  lisinopril (ZESTRIL) 5 MG tablet, Take 1 tablet by mouth daily., Disp: , Rfl:  .  losartan-hydrochlorothiazide (HYZAAR) 50-12.5 MG tablet, Take 1 tablet by mouth daily., Disp: , Rfl:  .  meloxicam (MOBIC) 7.5 MG tablet, Take 1 tablet by mouth daily., Disp: , Rfl:  .  Metoprolol Tartrate 75 MG TABS, Take 75 mg by mouth every 12 (twelve) hours., Disp: 60 tablet, Rfl: 1 .  montelukast (SINGULAIR) 10 MG tablet, Take 10 mg by mouth at bedtime., Disp: , Rfl:  .  nitroGLYCERIN (NITROSTAT) 0.4 MG SL tablet, Place 1 tablet (0.4 mg total) under the tongue every 5 (five) minutes as needed for chest pain., Disp: 25 tablet, Rfl: 3 .  omeprazole (PRILOSEC) 20 MG capsule, Take 1 capsule (20 mg total) by mouth daily., Disp: 90 capsule, Rfl: 1 .  pregabalin (LYRICA) 200 MG capsule, Take 200 mg by mouth 3 (three) times daily. Held by MD, Disp: , Rfl:  .  promethazine (PHENERGAN) 25 MG tablet, Take 25 mg by mouth every 6 (six) hours as needed for nausea or vomiting., Disp: ,  Rfl:  .  traZODone (DESYREL) 50 MG tablet, Take 25-50 mg by mouth at bedtime as needed for sleep., Disp: , Rfl:  .  Vitamin D, Cholecalciferol, 50 MCG (2000 UT) CAPS, Take 1 capsule by mouth daily., Disp: , Rfl:   Social History   Tobacco Use  Smoking Status Never Smoker  Smokeless Tobacco Never Used    Allergies  Allergen Reactions  . Contrast Media [Iodinated Diagnostic Agents] Anaphylaxis    Had CT scan 12/14/17, had trouble breathing, throat swelled  . Penicillins Rash and Other (See Comments)    PATIENT HAS HAD A PCN REACTION WITH IMMEDIATE RASH, FACIAL/TONGUE/THROAT SWELLING, SOB, OR LIGHTHEADEDNESS WITH HYPOTENSION:  #  #  YES  #  #  Has patient had a PCN reaction causing severe rash involving mucus membranes or skin necrosis:  no Has  patient had a PCN reaction that required hospitalization:  no Has patient had a PCN reaction occurring within the last 10 years: no   . Methocarbamol Nausea Only and Other (See Comments)    headache  . Codeine Anxiety    Dizziness and headache   Objective:  There were no vitals filed for this visit. There is no height or weight on file to calculate BMI. Constitutional Well developed. Well nourished.  Vascular Dorsalis pedis pulses palpable bilaterally. Posterior tibial pulses palpable bilaterally. Capillary refill normal to all digits.  No cyanosis or clubbing noted. Pedal hair growth normal.  Neurologic Normal speech. Oriented to person, place, and time. Epicritic sensation to light touch grossly present bilaterally.  Dermatologic Nails well groomed and normal in appearance. No open wounds. No skin lesions.  Orthopedic:  Pain on palpation to the left first metatarsophalangeal joint.  Pain with range of motion of the metatarsophalangeal joint.  Intra-articular pain noted.  Crepitus was felt.  No concern for first interspace neuroma negative Mulder's click.  No extensor or flexor tendinitis noted.   Radiographs: 3 views of skeletally mature adult left foot: Mild arthritic changes noted at the left first metatarsophalangeal joint.  No joint still fights or loose bodies noted. Bipartite sesamoid noted.  There is decreasing calcaneal inclination angle increase in talar declination angle mild anterior break in the cyma line.  Mild arthritis noted at the midfoot.  Mild elevatus of the first met noted.    Assessment:   1. Plantar fasciitis of left foot    Plan:  Patient was evaluated and treated and all questions answered.  Left first metatarsophalangeal joint arthritis -I explained to the patient the etiology of arthritis and various treatment options schedule discussed with the patient.  The arthritic changes are very mild in nature and barely appreciated on x-ray however clinically  all the signs and symptoms point to arthritic changes with crepitus as well as pain/intra-articular pain with range of motion.  I believe patient will benefit from a steroid injection to help decrease the inflammatory component from arthritis.  Patient agrees to the plan would like to proceed with a steroid injection. -A steroid injection was performed at left first metatarsophalangeal joint using 1% plain Lidocaine and 10 mg of Kenalog. This was well tolerated.   My flexible pes planus -I explained to the patient the etiology of pes planus deformity and various treatment options were discussed.  I believe patient will benefit from custom-made orthotics with a Morton's extension given that the patient has too much stress in the left metatarsophalangeal joint.  I will discuss this with Liliane Channel and she will follow-up with Liliane Channel for  custom-made orthotics with Morton's extension.  Return in about 1 week (around 08/31/2019) for Sched with Liliane Channel for [employer].

## 2019-09-03 DIAGNOSIS — R0789 Other chest pain: Secondary | ICD-10-CM | POA: Diagnosis not present

## 2019-09-03 DIAGNOSIS — M542 Cervicalgia: Secondary | ICD-10-CM | POA: Diagnosis not present

## 2019-09-03 DIAGNOSIS — Z1231 Encounter for screening mammogram for malignant neoplasm of breast: Secondary | ICD-10-CM | POA: Diagnosis not present

## 2019-09-03 DIAGNOSIS — E875 Hyperkalemia: Secondary | ICD-10-CM | POA: Diagnosis not present

## 2019-09-03 DIAGNOSIS — G8918 Other acute postprocedural pain: Secondary | ICD-10-CM | POA: Diagnosis not present

## 2019-09-03 DIAGNOSIS — D509 Iron deficiency anemia, unspecified: Secondary | ICD-10-CM | POA: Diagnosis not present

## 2019-09-03 DIAGNOSIS — R1310 Dysphagia, unspecified: Secondary | ICD-10-CM | POA: Diagnosis not present

## 2019-09-03 DIAGNOSIS — G894 Chronic pain syndrome: Secondary | ICD-10-CM | POA: Diagnosis not present

## 2019-09-04 ENCOUNTER — Encounter: Payer: Self-pay | Admitting: Nurse Practitioner

## 2019-09-04 DIAGNOSIS — I10 Essential (primary) hypertension: Secondary | ICD-10-CM | POA: Diagnosis not present

## 2019-09-04 DIAGNOSIS — R3121 Asymptomatic microscopic hematuria: Secondary | ICD-10-CM | POA: Diagnosis not present

## 2019-09-04 DIAGNOSIS — F4323 Adjustment disorder with mixed anxiety and depressed mood: Secondary | ICD-10-CM | POA: Diagnosis not present

## 2019-09-06 ENCOUNTER — Ambulatory Visit: Payer: Medicaid Other | Admitting: Orthotics

## 2019-09-06 ENCOUNTER — Other Ambulatory Visit: Payer: Self-pay

## 2019-09-06 DIAGNOSIS — M2142 Flat foot [pes planus] (acquired), left foot: Secondary | ICD-10-CM

## 2019-09-06 DIAGNOSIS — M79675 Pain in left toe(s): Secondary | ICD-10-CM

## 2019-09-06 DIAGNOSIS — M2141 Flat foot [pes planus] (acquired), right foot: Secondary | ICD-10-CM

## 2019-09-06 DIAGNOSIS — M19072 Primary osteoarthritis, left ankle and foot: Secondary | ICD-10-CM

## 2019-09-06 NOTE — Progress Notes (Signed)
Patient is going to try to find old f/o that may be in storage and additons can be added to them that make less expensive for her; she doesn't feel she can afford the $438 for new ones.

## 2019-09-15 NOTE — Progress Notes (Signed)
09/15/2019 Diana [name] [id number] 06/14/1955   CHIEF COMPLAINT:  Dysphagia, iron deficiency anemia   HISTORY OF PRESENT ILLNESS:  Diana [name] is a 64 year old female with a past medical history of anxiety,  depression, hypertension, coronary artery disease, NSTEMI 10/2016, s/p 4 vessel CABG 8916, diastolic CHF, cervical spine disease and costochondritis. Past tubal ligation, tonsillectomy, right wrist surgery and left thumb surgery.  She was referred to our office by Harrison Mons PA-C at [facility] for further evaluation regarding dysphagia and iron deficiency anemia.  She complains of having dysphagia which started a few months after her coronary bypass surgery.  She describes having certain foods such as vegetables and meat gets stuck to be upper esophagus.  Sometimes fluids such as smoothies also gets stuck.  She typically gags and coughs then the stuck food or liquid passes down the esophagus.  Her dysphagia episodes occur approximately 3-6 times monthly.  No associated heartburn or upper abdominal pain.  She is taking Omeprazole 20 mg once daily, she reported taking this medication for the past 3 years due to having abdominal bloat symptoms. She denies having any lower abdominal pain.  She is passing a normal formed brown stool once daily as long as she takes MiraLAX daily.  She has prior constipation associated with taking oral iron.  She was previously taking ferrous gluconate 324 mg once daily.  However, her iron levels dropped recently and she is now taking ferrous gluconate 2 tabs daily.  She reports having a history of iron deficiency anemia since 2016 or 2017. She saw a hematologist in 2019 without significant findings. She received IV iron x 30 August 2017.   She underwent a Cologuard test 02/28/2019 which was negative.  She denies ever having a screening colonoscopy.  She takes aspirin 81 mg daily.  She takes Meloxicam 15 mg once daily due to chest wall pain which developed after  her coronary bypass surgery.  No other NSAIDs.  No alcohol use.  No fever, sweats or chills.  No weight loss. No other complaints today. No family history of esophageal, gastric or colon cancer.   Lab results 09/03/2019: Glucose 89.  BUN 24.  Creatinine 0.69.  Sodium 141.  Potassium 4.4.  Chloride 102.  CO2 21.  Calcium 8.8.  Lab results 09/04/2019: WBC 3.9.  Hemoglobin 10.2.  Hematocrit 33.9.  MCV 80.9.  Platelet 186.   Lab results 07/30/2019: WBC 3.1.  RBC 4.10.  Hemoglobin 9.6.  Hematocrit 32.2.  MCV 78.4.  Platelet 167.  Glucose 94.  BUN 18.  Creatinine 0.99.  Sodium 143.  Potassium 4.0. T.bili 0.4.  Alk phos 77.  AST 31.  ALT 24.  Lab results 07/28/2018: Iron 82.  TIBC 239.  Iron saturation 34.  CBC Latest Ref Rng & Units 01/16/2018 01/14/2018 01/13/2018  WBC 4.0 - 10.5 K/uL 9.9 13.4(H) 16.1(H)  Hemoglobin 12.0 - 15.0 g/dL 9.2(L) 9.1(L) 9.0(L)  Hematocrit 36.0 - 46.0 % 28.7(L) 27.7(L) 28.6(L)  Platelets 150 - 400 K/uL 241 151 108(L)     ECHO 03/15/2019:   Left ventricular ejection fraction, by visual estimation, is 50 to 55%. The left ventricle has  normal function. Normal left ventricular size. Left ventricular septal wall thickness was mildly  increased. There is no left ventricular hypertrophy.  Left ventricular diastolic Doppler parameters are consistent with pseudonormalization pattern  of LV diastolic filling (Grade II Diastolic Dysfunction).    Past Medical History:  Diagnosis Date   Allergy  Arthritis    feet   Bronchitis 10/30/2016   Carpal tunnel syndrome    Coronary artery disease    GERD (gastroesophageal reflux disease)    Hyperlipidemia    Hypertension    Non-ST elevation (NSTEMI) myocardial infarction ([place]) 10/30/2016   NSTEMI (non-ST elevated myocardial infarction) ([place]) 10/30/2016   Past Surgical History:  Procedure Laterality Date   CARPAL TUNNEL RELEASE Left 02/12/2016   Procedure: LEFT CARPAL TUNNEL RELEASE;  Surgeon: Daryll Brod, MD;  Location: [facility];  Service: Orthopedics;  Laterality: Left;  FAB   CORONARY ARTERY BYPASS GRAFT N/A 01/10/2018   Procedure: CORONARY ARTERY BYPASS GRAFTING (CABG) TIMES 4, USING LEFT INTERNAL MAMMARY ARTERY TO OM1  AND ENDOSCOPICALLY HARVESTED RIGHT SAPHENOUS VEIN TO DIAGONAL, AND SEQUENTIALLY TO OM2 AND DISTAL CIRCUMFLEX;  Surgeon: Grace Isaac, MD;  Location: [facility];  Service: Open Heart Surgery;  Laterality: N/A;   LEFT HEART CATH AND CORONARY ANGIOGRAPHY N/A 01/05/2018   Procedure: LEFT HEART CATH AND CORONARY ANGIOGRAPHY;  Surgeon: Troy Sine, MD;  Location: [facility];  Service: Cardiovascular;  Laterality: N/A;   TEE WITHOUT CARDIOVERSION N/A 01/10/2018   Procedure: TRANSESOPHAGEAL ECHOCARDIOGRAM (TEE);  Surgeon: Grace Isaac, MD;  Location: [facility];  Service: Open Heart Surgery;  Laterality: N/A;   TONSILLECTOMY     TUBAL LIGATION     WRIST SURGERY Right     Family History: Mother 19 with thyroid disease. Father's history unknown.   Social History: Single. She has 2 sons. Nonsmoker. No alcohol or drug use. Sister with benign brain tumor. Brother with alcoholic liver disease died at the age of . Sister died from HIV complications.   Allergies  Allergen Reactions   Contrast Media [Iodinated Diagnostic Agents] Anaphylaxis    Had CT scan 12/14/17, had trouble breathing, throat swelled   Penicillins Rash and Other (See Comments)    PATIENT HAS HAD A PCN REACTION WITH IMMEDIATE RASH, FACIAL/TONGUE/THROAT SWELLING, SOB, OR LIGHTHEADEDNESS WITH HYPOTENSION:  #  #  YES  #  #  Has patient had a PCN reaction causing severe rash involving mucus membranes or skin necrosis:  no Has patient had a PCN reaction that required hospitalization:  no Has patient had a PCN reaction occurring within the last 10 years: no    Methocarbamol Nausea Only and Other (See Comments)    headache   Codeine Anxiety    Dizziness and headache      Outpatient Encounter Medications as  of 09/17/2019  Medication Sig   acetaminophen (TYLENOL) 500 MG tablet Take 500 mg by mouth every 6 (six) hours as needed.   ALPRAZolam (XANAX) 0.5 MG tablet Take 1 tablet (0.5 mg total) by mouth 2 (two) times daily as needed for anxiety.   aspirin EC 81 MG tablet Take 81 mg by mouth daily.   atorvastatin (LIPITOR) 80 MG tablet Take 0.5 tablets (40 mg total) by mouth daily at 6 PM.   cetirizine (ZYRTEC) 10 MG tablet Take 10 mg by mouth daily.   DULoxetine (CYMBALTA) 30 MG capsule Take 30 mg by mouth daily.   ferrous gluconate (FERGON) 324 MG tablet Take 2 tablets by mouth daily.    hydrochlorothiazide (HYDRODIURIL) 12.5 MG tablet Take 12.5 mg by mouth daily.   lisinopril (ZESTRIL) 5 MG tablet Take 1 tablet by mouth daily.   losartan-hydrochlorothiazide (HYZAAR) 50-12.5 MG tablet Take 1 tablet by mouth daily.   meloxicam (MOBIC) 7.5 MG tablet Take 1 tablet by mouth daily.  Metoprolol Tartrate 75 MG TABS Take 75 mg by mouth every 12 (twelve) hours.   montelukast (SINGULAIR) 10 MG tablet Take 10 mg by mouth at bedtime.   nitroGLYCERIN (NITROSTAT) 0.4 MG SL tablet Place 1 tablet (0.4 mg total) under the tongue every 5 (five) minutes as needed for chest pain.   omeprazole (PRILOSEC) 20 MG capsule Take 1 capsule (20 mg total) by mouth daily.   pregabalin (LYRICA) 200 MG capsule Take 200 mg by mouth 3 (three) times daily. Held by MD   promethazine (PHENERGAN) 25 MG tablet Take 25 mg by mouth every 6 (six) hours as needed for nausea or vomiting.   traZODone (DESYREL) 50 MG tablet Take 25-50 mg by mouth at bedtime as needed for sleep.   Vitamin D, Cholecalciferol, 50 MCG (2000 UT) CAPS Take 1 capsule by mouth daily.   No facility-administered encounter medications on file as of 09/17/2019.     REVIEW OF SYSTEMS: All other systems reviewed and negative except where noted in the History of Present Illness.   PHYSICAL EXAM: BP (!) 142/80    Pulse 67    Temp 97.6 F (36.4 C)    Ht  '5\' 2"'  (1.575 m)    Wt 149 lb (67.6 kg)    BMI 27.25 kg/m  General: Well developed  64 year old female in no acute distress. Head: Normocephalic and atraumatic. Eyes:  Sclerae non-icteric, conjunctive pink. Ears: Normal auditory acuity. Mouth: Dentition intact. No ulcers or lesions.  Neck: Supple, no lymphadenopathy or thyromegaly.  Lungs: Clear bilaterally to auscultation without wheezes, crackles or rhonchi. Heart: Regular rate and rhythm. No murmur, rub or gallop appreciated.  Abdomen: Soft,  non distended. Mild tenderness RLQ and central lower abdomen without rebound or guarding. No masses. No hepatosplenomegaly. Normoactive bowel sounds x 4 quadrants. Small scar below the xyphoid intact.  Rectal: Deferred.  Musculoskeletal: Symmetrical with no gross deformities. Skin: Warm and dry. No rash or lesions on visible extremities. Extremities: No edema. Neurological: Alert oriented x 4, no focal deficits.  Psychological:  Alert and cooperative. Normal mood and affect.  ASSESSMENT AND PLAN:  2. 64 year old female with IDA. -EGD and colonoscopy benefits and risks discussed including risk with sedation, risk of bleeding, perforation and infection  -Check iron panel, TTG and IgA with next lab draw, will order around 10/04/2019 -Further follow up to be determined after EGD and colonoscopy completed   2. Dysphagia -See plan in # 1 -Continue Omeprazole 61m daily for now -Take 2 sips of water before swallowing any food or pills, cut food into small pieces and chew food thoroughly  2. Stable CAD,  NSTEMI 2018, s/p 4 vessel CABG 2019 followed by Dr. KAgustin Cree  3. Chronic chest wall pain on Meloxicam and physical therapy/rehab        CC:  JHarrison Mons PA

## 2019-09-17 ENCOUNTER — Encounter: Payer: Self-pay | Admitting: Nurse Practitioner

## 2019-09-17 ENCOUNTER — Ambulatory Visit: Payer: Medicaid Other | Admitting: Nurse Practitioner

## 2019-09-17 VITALS — BP 142/80 | HR 67 | Temp 97.6°F | Ht 62.0 in | Wt 149.0 lb

## 2019-09-17 DIAGNOSIS — R1310 Dysphagia, unspecified: Secondary | ICD-10-CM | POA: Diagnosis not present

## 2019-09-17 DIAGNOSIS — D508 Other iron deficiency anemias: Secondary | ICD-10-CM

## 2019-09-17 MED ORDER — NA SULFATE-K SULFATE-MG SULF 17.5-3.13-1.6 GM/177ML PO SOLN: 1.0000 | Freq: Once | ORAL | 0 refills | Status: AC

## 2019-09-17 NOTE — Patient Instructions (Signed)
If you are age 62 or older, your body mass index should be between 23-30. Your Body mass index is 27.25 kg/m. If this is out of the aforementioned range listed, please consider follow up with your Primary Care Provider.  If you are age 67 or younger, your body mass index should be between 19-25. Your Body mass index is 27.25 kg/m. If this is out of the aformentioned range listed, please consider follow up with your Primary Care Provider.    Due to recent changes in healthcare laws, you may see the results of your imaging and laboratory studies on MyChart before your provider has had a chance to review them.  We understand that in some cases there may be results that are confusing or concerning to you. Not all laboratory results come back in the same time frame and the provider may be waiting for multiple results in order to interpret others.  Please give [place] 48 hours in order for your provider to thoroughly review all the results before contacting the office for clarification of your results.   We have sent the following medications to your pharmacy for you to pick up at your convenience:  Suprep( colonoscopy prep)

## 2019-09-17 NOTE — Progress Notes (Signed)
I agree with the above note, plan 

## 2019-09-20 NOTE — Progress Notes (Signed)
Tracy, pls contact patient and provide order for a CBC, iron, iron saturation, TIBC, Ferritin, tTg and IgA level to be done 10/04/2019.

## 2019-09-21 ENCOUNTER — Ambulatory Visit: Payer: Medicaid Other | Admitting: Podiatry

## 2019-09-21 ENCOUNTER — Encounter: Payer: Self-pay | Admitting: Podiatry

## 2019-09-21 ENCOUNTER — Other Ambulatory Visit: Payer: Self-pay

## 2019-09-21 DIAGNOSIS — M19072 Primary osteoarthritis, left ankle and foot: Secondary | ICD-10-CM

## 2019-09-21 DIAGNOSIS — L84 Corns and callosities: Secondary | ICD-10-CM

## 2019-09-21 DIAGNOSIS — M216X2 Other acquired deformities of left foot: Secondary | ICD-10-CM

## 2019-09-21 DIAGNOSIS — M79675 Pain in left toe(s): Secondary | ICD-10-CM

## 2019-09-21 NOTE — Progress Notes (Signed)
Subjective:  Patient ID: Diana [name], female    DOB: May 29, 1956,  MRN: [id number]  Chief Complaint  Patient presents with  . Foot Pain    pt is here for left foot pain, pt states that pain is also elevated to the touch, pt also states that the injection she recieved last time has helped as well.    64 y.o. female presents with the above complaint.  P patient presents with a follow-up of left first metatarsophalangeal joint pain.  Patient states the injection helped her a lot.  Her pain has resolved to the first metatarsophalangeal joint.  She now has a secondary complaints of interdigital space pain from second toe rubbing against the first toe.  There is no corn formation hyperkeratotic lesion.  She is also waiting to get orthotics she was casted for them last week.  She denies any other acute complaints.   Review of Systems: Negative except as noted in the HPI. Denies N/V/F/Ch.  Past Medical History:  Diagnosis Date  . Allergy   . Arthritis    feet  . Bronchitis 10/30/2016  . Carpal tunnel syndrome   . Coronary artery disease   . GERD (gastroesophageal reflux disease)   . Hyperlipidemia   . Hypertension   . Non-ST elevation (NSTEMI) myocardial infarction ([place]) 10/30/2016  . NSTEMI (non-ST elevated myocardial infarction) ([place]) 10/30/2016    Current Outpatient Medications:  .  acetaminophen (TYLENOL) 500 MG tablet, Take 500 mg by mouth every 6 (six) hours as needed., Disp: , Rfl:  .  Adhesive Tape (MUELLER KINESIOLOGY) TAPE, Apply as directed to reduce muscle/joint pain, Disp: , Rfl:  .  ALPRAZolam (XANAX) 0.5 MG tablet, Take 1 tablet (0.5 mg total) by mouth 2 (two) times daily as needed for anxiety., Disp: 20 tablet, Rfl: 0 .  aspirin EC 81 MG tablet, Take 81 mg by mouth daily., Disp: , Rfl:  .  atorvastatin (LIPITOR) 80 MG tablet, Take 0.5 tablets (40 mg total) by mouth daily at 6 PM., Disp: 60 tablet, Rfl: 1 .  cetirizine (ZYRTEC) 10 MG tablet, Take 10 mg by mouth daily., Disp: ,  Rfl:  .  DULoxetine (CYMBALTA) 30 MG capsule, Take 30 mg by mouth daily., Disp: , Rfl:  .  ferrous gluconate (FERGON) 324 MG tablet, Take 2 tablets by mouth daily. , Disp: , Rfl:  .  losartan-hydrochlorothiazide (HYZAAR) 50-12.5 MG tablet, Take 1 tablet by mouth daily., Disp: , Rfl:  .  meloxicam (MOBIC) 15 MG tablet, Take 1 tablet by mouth daily. , Disp: , Rfl:  .  Metoprolol Tartrate 75 MG TABS, Take 75 mg by mouth every 12 (twelve) hours., Disp: 60 tablet, Rfl: 1 .  nitroGLYCERIN (NITROSTAT) 0.4 MG SL tablet, Place 1 tablet (0.4 mg total) under the tongue every 5 (five) minutes as needed for chest pain., Disp: 25 tablet, Rfl: 3 .  omeprazole (PRILOSEC) 20 MG capsule, Take 1 capsule (20 mg total) by mouth daily., Disp: 90 capsule, Rfl: 1 .  pregabalin (LYRICA) 200 MG capsule, Take 150 mg by mouth 3 (three) times daily. Held by MD, Disp: , Rfl:  .  traZODone (DESYREL) 50 MG tablet, Take 25-50 mg by mouth at bedtime as needed for sleep., Disp: , Rfl:  .  Vitamin D, Cholecalciferol, 50 MCG (2000 UT) CAPS, Take 1 capsule by mouth daily., Disp: , Rfl:   Social History   Tobacco Use  Smoking Status Never Smoker  Smokeless Tobacco Never Used    Allergies  Allergen  Reactions  . Contrast Media [Iodinated Diagnostic Agents] Anaphylaxis    Had CT scan 12/14/17, had trouble breathing, throat swelled  . Penicillins Rash and Other (See Comments)    PATIENT HAS HAD A PCN REACTION WITH IMMEDIATE RASH, FACIAL/TONGUE/THROAT SWELLING, SOB, OR LIGHTHEADEDNESS WITH HYPOTENSION:  #  #  YES  #  #  Has patient had a PCN reaction causing severe rash involving mucus membranes or skin necrosis:  no Has patient had a PCN reaction that required hospitalization:  no Has patient had a PCN reaction occurring within the last 10 years: no   . Methocarbamol Nausea Only and Other (See Comments)    headache  . Codeine Anxiety    Dizziness and headache   Objective:  There were no vitals filed for this visit. There is  no height or weight on file to calculate BMI. Constitutional Well developed. Well nourished.  Vascular Dorsalis pedis pulses palpable bilaterally. Posterior tibial pulses palpable bilaterally. Capillary refill normal to all digits.  No cyanosis or clubbing noted. Pedal hair growth normal.  Neurologic Normal speech. Oriented to person, place, and time. Epicritic sensation to light touch grossly present bilaterally.  Dermatologic Nails well groomed and normal in appearance. No open wounds. No skin lesions.  Orthopedic:  No pain on palpation to the left first metatarsophalangeal joint.  No pain with range of motion of the metatarsophalangeal joint.  No intra-articular pain noted.  Crepitus was felt.  No concern for first interspace neuroma negative Mulder's click.  No extensor or flexor tendinitis noted.  Interdigital space pain without callus formation/heloma molle.  Second toe appear to be medially deviated against the big toe causing a little bit of excessive pressure.   Radiographs: 3 views of skeletally mature adult left foot: Mild arthritic changes noted at the left first metatarsophalangeal joint.  No joint still fights or loose bodies noted. Bipartite sesamoid noted.  There is decreasing calcaneal inclination angle increase in talar declination angle mild anterior break in the cyma line.  Mild arthritis noted at the midfoot.  Mild elevatus of the first met noted.    Assessment:   1. Heloma molle   2. Arthritis of first metatarsophalangeal (MTP) joint of left foot   3. Great toe pain, left    Plan:  Patient was evaluated and treated and all questions answered.  Left first/second interdigital abrasion -I explained the patient the etiology of abrasion/possible heloma molle without hyperkeratotic lesion.  I believe patient will benefit from a spacer/toe protector to help offload the space.  Patient agrees with the plan -Toe protector/spacer was dispensed  Left first  metatarsophalangeal joint arthritis~resolved with 1 steroid injection -Patient has clinically improved considerably.  I believe patient will benefit from the orthotics that she should be picking up soon with Morton's extension to help offload her left first metatarsophalangeal joint.  My flexible pes planus -Patient was casted for orthotics and this will help address the semiflexible pes planus deformity as well as Morton's extension for left first metatarsophalangeal joint pain.  No follow-ups on file.

## 2019-09-24 ENCOUNTER — Ambulatory Visit: Payer: Medicaid Other | Admitting: Orthotics

## 2019-09-24 ENCOUNTER — Other Ambulatory Visit: Payer: Self-pay

## 2019-09-24 DIAGNOSIS — L84 Corns and callosities: Secondary | ICD-10-CM

## 2019-09-24 DIAGNOSIS — M19072 Primary osteoarthritis, left ankle and foot: Secondary | ICD-10-CM

## 2019-09-24 NOTE — Progress Notes (Signed)
Added k-wedge to RIGHT foot orthotic.

## 2019-09-24 NOTE — Addendum Note (Signed)
Addended by: Latricia Heft A on: 09/24/2019 04:54 PM   Modules accepted: Orders

## 2019-09-26 ENCOUNTER — Telehealth: Payer: Self-pay | Admitting: General Surgery

## 2019-09-26 DIAGNOSIS — K219 Gastro-esophageal reflux disease without esophagitis: Secondary | ICD-10-CM

## 2019-09-26 DIAGNOSIS — R1013 Epigastric pain: Secondary | ICD-10-CM

## 2019-09-26 DIAGNOSIS — K5900 Constipation, unspecified: Secondary | ICD-10-CM

## 2019-09-26 DIAGNOSIS — K625 Hemorrhage of anus and rectum: Secondary | ICD-10-CM

## 2019-09-26 DIAGNOSIS — D508 Other iron deficiency anemias: Secondary | ICD-10-CM

## 2019-09-26 DIAGNOSIS — R1310 Dysphagia, unspecified: Secondary | ICD-10-CM

## 2019-09-26 NOTE — Telephone Encounter (Signed)
-----   Message from Colleen M Kennedy-Smith, NP sent at 09/20/2019  5:17 AM EDT -----   ----- Message ----- From: Jacobs, Daniel P, MD Sent: 09/17/2019  12:32 PM EDT To: Colleen M Kennedy-Smith, NP    ----- Message ----- From: Kennedy-Smith, Colleen M, NP Sent: 09/17/2019  12:03 PM EDT To: Daniel P Jacobs, MD  

## 2019-09-26 NOTE — Telephone Encounter (Signed)
-----   Message from Arnaldo Natal, NP sent at 09/20/2019  5:17 AM EDT -----   ----- Message ----- From: Rachael Fee, MD Sent: 09/17/2019  12:32 PM EDT To: Arnaldo Natal, NP    ----- Message ----- From: Arnaldo Natal, NP Sent: 09/17/2019  12:03 PM EDT To: Rachael Fee, MD

## 2019-09-26 NOTE — Telephone Encounter (Signed)
Spoke with the patient and explained we need her to come in by 10/04/2019 for blood work. The patient verbalized understanding and stated she could only come on 10/02/2019 expressed that would be fine. She will come to the lab here

## 2019-10-01 DIAGNOSIS — M7741 Metatarsalgia, right foot: Secondary | ICD-10-CM | POA: Diagnosis not present

## 2019-10-01 DIAGNOSIS — D509 Iron deficiency anemia, unspecified: Secondary | ICD-10-CM | POA: Diagnosis not present

## 2019-10-03 ENCOUNTER — Other Ambulatory Visit (INDEPENDENT_AMBULATORY_CARE_PROVIDER_SITE_OTHER): Payer: Medicaid Other

## 2019-10-03 DIAGNOSIS — K625 Hemorrhage of anus and rectum: Secondary | ICD-10-CM

## 2019-10-03 DIAGNOSIS — K219 Gastro-esophageal reflux disease without esophagitis: Secondary | ICD-10-CM | POA: Diagnosis not present

## 2019-10-03 DIAGNOSIS — R1013 Epigastric pain: Secondary | ICD-10-CM | POA: Diagnosis not present

## 2019-10-03 DIAGNOSIS — K5900 Constipation, unspecified: Secondary | ICD-10-CM | POA: Diagnosis not present

## 2019-10-03 DIAGNOSIS — D508 Other iron deficiency anemias: Secondary | ICD-10-CM

## 2019-10-03 DIAGNOSIS — R1310 Dysphagia, unspecified: Secondary | ICD-10-CM | POA: Diagnosis not present

## 2019-10-03 LAB — CBC
HCT: 32.2 % — ABNORMAL LOW (ref 36.0–46.0)
Hemoglobin: 10.1 g/dL — ABNORMAL LOW (ref 12.0–15.0)
MCHC: 31.2 g/dL (ref 30.0–36.0)
MCV: 81.3 fl (ref 78.0–100.0)
Platelets: 177 10*3/uL (ref 150.0–400.0)
RBC: 3.97 Mil/uL (ref 3.87–5.11)
RDW: 15.6 % — ABNORMAL HIGH (ref 11.5–15.5)
WBC: 3.2 10*3/uL — ABNORMAL LOW (ref 4.0–10.5)

## 2019-10-03 LAB — IBC + FERRITIN
Ferritin: 1302.4 ng/mL — ABNORMAL HIGH (ref 10.0–291.0)
Iron: 74 ug/dL (ref 42–145)
Saturation Ratios: 24.8 % (ref 20.0–50.0)
Transferrin: 213 mg/dL (ref 212.0–360.0)

## 2019-10-03 LAB — IGA: IgA: 419 mg/dL — ABNORMAL HIGH (ref 68–378)

## 2019-10-04 LAB — IRON, TOTAL/TOTAL IRON BINDING CAP
%SAT: 27 % (calc) (ref 16–45)
Iron: 72 ug/dL (ref 45–160)
TIBC: 269 mcg/dL (calc) (ref 250–450)

## 2019-10-04 LAB — TISSUE TRANSGLUTAMINASE, IGA: (tTG) Ab, IgA: 1 U/mL

## 2019-10-08 ENCOUNTER — Other Ambulatory Visit: Payer: Self-pay

## 2019-10-08 DIAGNOSIS — D509 Iron deficiency anemia, unspecified: Secondary | ICD-10-CM

## 2019-10-08 DIAGNOSIS — D508 Other iron deficiency anemias: Secondary | ICD-10-CM

## 2019-10-10 ENCOUNTER — Other Ambulatory Visit: Payer: Self-pay | Admitting: *Deleted

## 2019-10-10 ENCOUNTER — Telehealth: Payer: Self-pay | Admitting: Nurse Practitioner

## 2019-10-12 ENCOUNTER — Other Ambulatory Visit: Payer: Medicaid Other

## 2019-10-14 NOTE — Progress Notes (Signed)
Patient Care Team: Harrison Mons, [place] as PCP - General (Family Medicine) Daryll Brod, MD as Consulting Physician (Orthopedic Surgery) Jacolyn Reedy, MD as Consulting Physician (Cardiology)  DIAGNOSIS:    ICD-10-CM   1. Normocytic anemia  D64.9     CHIEF COMPLIANT: Follow-up iron deficiency anemia and neutropenia  INTERVAL HISTORY: Diana [name] is a 64 y.o. with above-mentioned history of neutropenia and iron deficiency anemia. Labs on 10/03/19 showed Hg 10.1, HCT 32.2, WBC 3.2, iron saturation 24.8%, ferritin 1302. I last saw her almost 2 years ago. She presents to the clinic today for follow-up.  She had cardiac surgery August 2019 for triple bypass surgery.  She is doing much better since then.  Does not complain of any lightheadedness dizziness or shortness of breath on exertion.  ALLERGIES:  is allergic to contrast media [iodinated diagnostic agents]; penicillins; methocarbamol; and codeine.  MEDICATIONS:  Current Outpatient Medications  Medication Sig Dispense Refill  . acetaminophen (TYLENOL) 500 MG tablet Take 500 mg by mouth every 6 (six) hours as needed.    . Adhesive Tape (MUELLER KINESIOLOGY) TAPE Apply as directed to reduce muscle/joint pain    . ALPRAZolam (XANAX) 0.5 MG tablet Take 1 tablet (0.5 mg total) by mouth 2 (two) times daily as needed for anxiety. 20 tablet 0  . aspirin EC 81 MG tablet Take 81 mg by mouth daily.    Marland Kitchen atorvastatin (LIPITOR) 80 MG tablet Take 0.5 tablets (40 mg total) by mouth daily at 6 PM. 60 tablet 1  . cetirizine (ZYRTEC) 10 MG tablet Take 10 mg by mouth daily.    . DULoxetine (CYMBALTA) 30 MG capsule Take 30 mg by mouth daily.    . ferrous gluconate (FERGON) 324 MG tablet Take 2 tablets by mouth daily.     Marland Kitchen losartan-hydrochlorothiazide (HYZAAR) 50-12.5 MG tablet Take 1 tablet by mouth daily.    . meloxicam (MOBIC) 15 MG tablet Take 1 tablet by mouth daily.     . Metoprolol Tartrate 75 MG TABS Take 75 mg by mouth every 12 (twelve)  hours. 60 tablet 1  . nitroGLYCERIN (NITROSTAT) 0.4 MG SL tablet Place 1 tablet (0.4 mg total) under the tongue every 5 (five) minutes as needed for chest pain. 25 tablet 3  . omeprazole (PRILOSEC) 20 MG capsule Take 1 capsule (20 mg total) by mouth daily. 90 capsule 1  . pregabalin (LYRICA) 200 MG capsule Take 150 mg by mouth 3 (three) times daily. Held by MD    . traZODone (DESYREL) 50 MG tablet Take 25-50 mg by mouth at bedtime as needed for sleep.    . Vitamin D, Cholecalciferol, 50 MCG (2000 UT) CAPS Take 1 capsule by mouth daily.     No current facility-administered medications for this visit.    PHYSICAL EXAMINATION: ECOG PERFORMANCE STATUS: 1 - Symptomatic but completely ambulatory  Vitals:   10/15/19 1006  BP: 124/76  Pulse: 71  Resp: 18  Temp: 98.2 F (36.8 C)  SpO2: 100%   Filed Weights   10/15/19 1006  Weight: 150 lb 14.4 oz (68.4 kg)    LABORATORY DATA:  I have reviewed the data as listed CMP Latest Ref Rng & Units 03/10/2018 01/16/2018 01/14/2018  Glucose 70 - 99 mg/dL - 110(H) 111(H)  BUN 6 - 20 mg/dL - 10 11  Creatinine 0.44 - 1.00 mg/dL - 0.67 0.54  Sodium 135 - 145 mmol/L - 135 135  Potassium 3.5 - 5.1 mmol/L - 3.9 4.4  Chloride 98 -  111 mmol/L - 97(L) 96(L)  CO2 22 - 32 mmol/L - 27 29  Calcium 8.9 - 10.3 mg/dL - 9.2 8.8(L)  Total Protein 6.0 - 8.5 g/dL 7.6 - -  Total Bilirubin 0.0 - 1.2 mg/dL <0.2 - -  Alkaline Phos 39 - 117 IU/L 99 - -  AST 0 - 40 IU/L 44(H) - -  ALT 0 - 32 IU/L 49(H) - -    Lab Results  Component Value Date   WBC 3.2 (L) 10/03/2019   HGB 10.1 (L) 10/03/2019   HCT 32.2 (L) 10/03/2019   MCV 81.3 10/03/2019   PLT 177.0 10/03/2019   NEUTROABS 7.0 01/16/2018    ASSESSMENT & PLAN:  Normocytic anemia 04/19/2008: WBC 2.7, hemoglobin 10.7  IV iron given on 09/09/2017 2 doses Previous work-up: ANA negative, Z14 folic acid normal, bone marrow biopsy recommended but she did not want to do it. 10/03/2019: WBC 3.2, hemoglobin 10.1, iron  saturation 27%, ferritin 1302, IgA 419  Chronic normocytic anemia not currently due to iron deficiency: I suspect the cause of edema uppercase anemia of chronic inflammation.  This is supported by elevated ferritin levels.  Chronic leukopenia particularly the neutrophils.  She has had episodes of leukocytosis alternating with leukopenia.  It demonstrates that her bone marrow is capable of increase in the white counts if needed.  She has had this even 11 years ago and therefore I suspect that the cause is mostly benign.  I discussed with her that we could consider performing a bone marrow biopsy to once and for all make sure we do not have any concern for underlying myelodysplastic syndrome or myeloproliferative neoplasms. After much discussion we decided that since her blood counts have been waxing and waning for the past 11 years it is unlikely to be a malignant process and that we can continue to watch and monitor.  Elevated ferritin: Probably an acute phase reactant.  I decrease the iron from 4 tablets daily to 2 tablets daily. Return to clinic in 6 months with labs done ahead of time and follow-up.  No orders of the defined types were placed in this encounter.  The patient has a good understanding of the overall plan. she agrees with it. she will call with any problems that may develop before the next visit here.  Total time spent: 20 mins including face to face time and time spent for planning, charting and coordination of care  Nicholas Lose, MD 10/15/2019  I, Cloyde Reams Dorshimer, am acting as scribe for Dr. Nicholas Lose.  I have reviewed the above documentation for accuracy and completeness, and I agree with the above.

## 2019-10-15 ENCOUNTER — Inpatient Hospital Stay: Payer: Medicaid Other | Attending: Hematology and Oncology | Admitting: Hematology and Oncology

## 2019-10-15 ENCOUNTER — Other Ambulatory Visit: Payer: Self-pay

## 2019-10-15 DIAGNOSIS — R7989 Other specified abnormal findings of blood chemistry: Secondary | ICD-10-CM | POA: Insufficient documentation

## 2019-10-15 DIAGNOSIS — D72819 Decreased white blood cell count, unspecified: Secondary | ICD-10-CM | POA: Insufficient documentation

## 2019-10-15 DIAGNOSIS — D509 Iron deficiency anemia, unspecified: Secondary | ICD-10-CM | POA: Insufficient documentation

## 2019-10-15 DIAGNOSIS — Z7982 Long term (current) use of aspirin: Secondary | ICD-10-CM | POA: Diagnosis not present

## 2019-10-15 DIAGNOSIS — Z79899 Other long term (current) drug therapy: Secondary | ICD-10-CM | POA: Insufficient documentation

## 2019-10-15 DIAGNOSIS — D649 Anemia, unspecified: Secondary | ICD-10-CM | POA: Diagnosis not present

## 2019-10-15 NOTE — Assessment & Plan Note (Signed)
04/19/2008: WBC 2.7, hemoglobin 10.7  IV iron given on 09/09/2017 2 doses Previous work-up: ANA negative, N62 folic acid normal, bone marrow biopsy recommended but she did not want to do it. 10/03/2019: WBC 3.2, hemoglobin 10.1, iron saturation 27%, ferritin 1302, IgA 419  Chronic normocytic anemia not currently due to iron deficiency: I suspect the cause of edema uppercase anemia of chronic inflammation.  This is supported by elevated ferritin levels.  Chronic leukopenia particularly the neutrophils.  She has had episodes of leukocytosis alternating with leukopenia.  It demonstrates that her bone marrow is capable of increase in the white counts if needed.  She has had this even 11 years ago and therefore I suspect that the cause is mostly benign.  I discussed with her that we could consider performing a bone marrow biopsy to once and for all make sure we do not have any concern for underlying myelodysplastic syndrome or myeloproliferative neoplasms.

## 2019-10-16 ENCOUNTER — Telehealth: Payer: Self-pay | Admitting: Hematology and Oncology

## 2019-10-16 NOTE — Telephone Encounter (Signed)
Scheduled per los, patient has been called and voicemail has been left. 

## 2019-11-06 ENCOUNTER — Encounter: Payer: Medicaid Other | Admitting: Gastroenterology

## 2019-11-07 ENCOUNTER — Other Ambulatory Visit: Payer: Self-pay

## 2019-11-07 ENCOUNTER — Ambulatory Visit: Payer: Medicaid Other | Admitting: Podiatry

## 2019-11-07 DIAGNOSIS — M2142 Flat foot [pes planus] (acquired), left foot: Secondary | ICD-10-CM

## 2019-11-07 DIAGNOSIS — M2141 Flat foot [pes planus] (acquired), right foot: Secondary | ICD-10-CM | POA: Diagnosis not present

## 2019-11-07 DIAGNOSIS — L84 Corns and callosities: Secondary | ICD-10-CM

## 2019-11-08 ENCOUNTER — Encounter: Payer: Self-pay | Admitting: Podiatry

## 2019-11-08 NOTE — Progress Notes (Signed)
Subjective:  Patient ID: Diana [name], female    DOB: 1956-03-15,  MRN: [id number]  Chief Complaint  Patient presents with  . Toe Pain    Pt states still experiencing left 1st/2nd interdigital toe pain due to rubbing. Pt states "The spacers helped but they have worn out".    64 y.o. female presents with the above complaint.  Patient is following up with left first and second interdigital heloma molle.  Patient states she does not have any pain anymore.  She states that the toe protector/spacer has been helping a lot.  She will continue to do that.  She does not want any kind of surgical intervention at this time.  She would like to obtain new ones.  She denies any other acute complaints..   Review of Systems: Negative except as noted in the HPI. Denies N/V/F/Ch.  Past Medical History:  Diagnosis Date  . Allergy   . Arthritis    feet  . Bronchitis 10/30/2016  . Carpal tunnel syndrome   . Coronary artery disease   . GERD (gastroesophageal reflux disease)   . Hyperlipidemia   . Hypertension   . Non-ST elevation (NSTEMI) myocardial infarction ([place]) 10/30/2016  . NSTEMI (non-ST elevated myocardial infarction) ([place]) 10/30/2016    Current Outpatient Medications:  .  acetaminophen (TYLENOL) 500 MG tablet, Take 500 mg by mouth every 6 (six) hours as needed., Disp: , Rfl:  .  Adhesive Tape (MUELLER KINESIOLOGY) TAPE, Apply as directed to reduce muscle/joint pain, Disp: , Rfl:  .  ALPRAZolam (XANAX) 0.5 MG tablet, Take 1 tablet (0.5 mg total) by mouth 2 (two) times daily as needed for anxiety., Disp: 20 tablet, Rfl: 0 .  aspirin EC 81 MG tablet, Take 81 mg by mouth daily., Disp: , Rfl:  .  atorvastatin (LIPITOR) 80 MG tablet, Take 0.5 tablets (40 mg total) by mouth daily at 6 PM., Disp: 60 tablet, Rfl: 1 .  cetirizine (ZYRTEC) 10 MG tablet, Take 10 mg by mouth daily., Disp: , Rfl:  .  DULoxetine (CYMBALTA) 30 MG capsule, Take 30 mg by mouth daily., Disp: , Rfl:  .  ferrous gluconate (FERGON)  324 MG tablet, Take 2 tablets by mouth daily. , Disp: , Rfl:  .  losartan-hydrochlorothiazide (HYZAAR) 50-12.5 MG tablet, Take 1 tablet by mouth daily., Disp: , Rfl:  .  meloxicam (MOBIC) 15 MG tablet, Take 1 tablet by mouth daily. , Disp: , Rfl:  .  Metoprolol Tartrate 75 MG TABS, Take 75 mg by mouth every 12 (twelve) hours., Disp: 60 tablet, Rfl: 1 .  omeprazole (PRILOSEC) 20 MG capsule, Take 1 capsule (20 mg total) by mouth daily., Disp: 90 capsule, Rfl: 1 .  pregabalin (LYRICA) 200 MG capsule, Take 150 mg by mouth 3 (three) times daily. Held by MD, Disp: , Rfl:  .  traZODone (DESYREL) 50 MG tablet, Take 25-50 mg by mouth at bedtime as needed for sleep., Disp: , Rfl:  .  Vitamin D, Cholecalciferol, 50 MCG (2000 UT) CAPS, Take 1 capsule by mouth daily., Disp: , Rfl:  .  nitroGLYCERIN (NITROSTAT) 0.4 MG SL tablet, Place 1 tablet (0.4 mg total) under the tongue every 5 (five) minutes as needed for chest pain., Disp: 25 tablet, Rfl: 3  Social History   Tobacco Use  Smoking Status Never Smoker  Smokeless Tobacco Never Used    Allergies  Allergen Reactions  . Contrast Media [Iodinated Diagnostic Agents] Anaphylaxis    Had CT scan 12/14/17, had trouble breathing, throat  swelled  . Penicillins Rash and Other (See Comments)    PATIENT HAS HAD A PCN REACTION WITH IMMEDIATE RASH, FACIAL/TONGUE/THROAT SWELLING, SOB, OR LIGHTHEADEDNESS WITH HYPOTENSION:  #  #  YES  #  #  Has patient had a PCN reaction causing severe rash involving mucus membranes or skin necrosis:  no Has patient had a PCN reaction that required hospitalization:  no Has patient had a PCN reaction occurring within the last 10 years: no   . Methocarbamol Nausea Only and Other (See Comments)    headache  . Codeine Anxiety    Dizziness and headache   Objective:  There were no vitals filed for this visit. There is no height or weight on file to calculate BMI. Constitutional Well developed. Well nourished.  Vascular Dorsalis  pedis pulses palpable bilaterally. Posterior tibial pulses palpable bilaterally. Capillary refill normal to all digits.  No cyanosis or clubbing noted. Pedal hair growth normal.  Neurologic Normal speech. Oriented to person, place, and time. Epicritic sensation to light touch grossly present bilaterally.  Dermatologic Nails well groomed and normal in appearance. No open wounds. No skin lesions.  Orthopedic:  No pain on palpation to the left first metatarsophalangeal joint.  No pain with range of motion of the metatarsophalangeal joint.  No intra-articular pain noted.  Crepitus was felt.  No concern for first interspace neuroma negative Mulder's click.  No extensor or flexor tendinitis noted.  No pain with Interdigital space pain without callus formation/heloma molle.  Second toe appear to be medially deviated against the big toe causing a little bit of excessive pressure.   Radiographs: 3 views of skeletally mature adult left foot: Mild arthritic changes noted at the left first metatarsophalangeal joint.  No joint still fights or loose bodies noted. Bipartite sesamoid noted.  There is decreasing calcaneal inclination angle increase in talar declination angle mild anterior break in the cyma line.  Mild arthritis noted at the midfoot.  Mild elevatus of the first met noted.    Assessment:   1. Heloma molle   2. Pes planus of both feet    Plan:  Patient was evaluated and treated and all questions answered.  Left first/second interdigital abrasion -Clinically patient's pain has completely resolved with using the protectors and spacers.  At this time I discussed with the patient that she does not any surgical intervention as she is conservatively managing the pain.  Patient agrees with the plan and will continue to get more toe protector as needed. -More toe protector/spacer were dispensed. -I will also see her back as needed if any foot and ankle issues arises in the future.  Left first  metatarsophalangeal joint arthritis~resolved with 1 steroid injection -Patient has clinically improved considerably.  I believe patient will benefit from the orthotics that she should be picking up soon with Morton's extension to help offload her left first metatarsophalangeal joint.  My flexible pes planus -Patient was casted for orthotics and this will help address the semiflexible pes planus deformity as well as Morton's extension for left first metatarsophalangeal joint pain.  No follow-ups on file.

## 2019-11-22 DIAGNOSIS — Z91041 Radiographic dye allergy status: Secondary | ICD-10-CM | POA: Diagnosis not present

## 2019-11-22 DIAGNOSIS — Z888 Allergy status to other drugs, medicaments and biological substances status: Secondary | ICD-10-CM | POA: Diagnosis not present

## 2019-11-22 DIAGNOSIS — R072 Precordial pain: Secondary | ICD-10-CM | POA: Diagnosis not present

## 2019-11-22 DIAGNOSIS — Z7982 Long term (current) use of aspirin: Secondary | ICD-10-CM | POA: Diagnosis not present

## 2019-11-22 DIAGNOSIS — R42 Dizziness and giddiness: Secondary | ICD-10-CM | POA: Diagnosis not present

## 2019-11-22 DIAGNOSIS — M62838 Other muscle spasm: Secondary | ICD-10-CM | POA: Diagnosis not present

## 2019-11-22 DIAGNOSIS — Z79899 Other long term (current) drug therapy: Secondary | ICD-10-CM | POA: Diagnosis not present

## 2019-11-22 DIAGNOSIS — R0789 Other chest pain: Secondary | ICD-10-CM | POA: Diagnosis not present

## 2019-11-22 DIAGNOSIS — R079 Chest pain, unspecified: Secondary | ICD-10-CM | POA: Diagnosis not present

## 2019-11-22 DIAGNOSIS — R39198 Other difficulties with micturition: Secondary | ICD-10-CM | POA: Diagnosis not present

## 2019-11-22 DIAGNOSIS — Z880 Allergy status to penicillin: Secondary | ICD-10-CM | POA: Diagnosis not present

## 2019-11-22 DIAGNOSIS — R5383 Other fatigue: Secondary | ICD-10-CM | POA: Diagnosis not present

## 2019-11-22 DIAGNOSIS — R61 Generalized hyperhidrosis: Secondary | ICD-10-CM | POA: Diagnosis not present

## 2019-11-22 DIAGNOSIS — I2510 Atherosclerotic heart disease of native coronary artery without angina pectoris: Secondary | ICD-10-CM | POA: Diagnosis not present

## 2019-11-22 DIAGNOSIS — Z951 Presence of aortocoronary bypass graft: Secondary | ICD-10-CM | POA: Diagnosis not present

## 2019-11-22 DIAGNOSIS — E860 Dehydration: Secondary | ICD-10-CM | POA: Diagnosis not present

## 2019-11-22 DIAGNOSIS — R600 Localized edema: Secondary | ICD-10-CM | POA: Diagnosis not present

## 2019-11-22 DIAGNOSIS — I252 Old myocardial infarction: Secondary | ICD-10-CM | POA: Diagnosis not present

## 2019-11-22 DIAGNOSIS — Z886 Allergy status to analgesic agent status: Secondary | ICD-10-CM | POA: Diagnosis not present

## 2020-01-08 DIAGNOSIS — D649 Anemia, unspecified: Secondary | ICD-10-CM | POA: Diagnosis not present

## 2020-01-08 DIAGNOSIS — G8918 Other acute postprocedural pain: Secondary | ICD-10-CM | POA: Diagnosis not present

## 2020-01-08 DIAGNOSIS — I10 Essential (primary) hypertension: Secondary | ICD-10-CM | POA: Diagnosis not present

## 2020-01-08 DIAGNOSIS — I25118 Atherosclerotic heart disease of native coronary artery with other forms of angina pectoris: Secondary | ICD-10-CM | POA: Diagnosis not present

## 2020-01-08 DIAGNOSIS — R682 Dry mouth, unspecified: Secondary | ICD-10-CM | POA: Diagnosis not present

## 2020-01-08 DIAGNOSIS — R0789 Other chest pain: Secondary | ICD-10-CM | POA: Diagnosis not present

## 2020-01-08 DIAGNOSIS — Z1231 Encounter for screening mammogram for malignant neoplasm of breast: Secondary | ICD-10-CM | POA: Diagnosis not present

## 2020-01-08 DIAGNOSIS — E785 Hyperlipidemia, unspecified: Secondary | ICD-10-CM | POA: Diagnosis not present

## 2020-01-09 DIAGNOSIS — E785 Hyperlipidemia, unspecified: Secondary | ICD-10-CM | POA: Diagnosis not present

## 2020-01-09 DIAGNOSIS — I10 Essential (primary) hypertension: Secondary | ICD-10-CM | POA: Diagnosis not present

## 2020-01-15 DIAGNOSIS — Z1231 Encounter for screening mammogram for malignant neoplasm of breast: Secondary | ICD-10-CM | POA: Diagnosis not present

## 2020-01-22 DIAGNOSIS — M659 Synovitis and tenosynovitis, unspecified: Secondary | ICD-10-CM | POA: Diagnosis not present

## 2020-01-22 DIAGNOSIS — R52 Pain, unspecified: Secondary | ICD-10-CM | POA: Diagnosis not present

## 2020-01-22 DIAGNOSIS — M79672 Pain in left foot: Secondary | ICD-10-CM | POA: Diagnosis not present

## 2020-01-22 DIAGNOSIS — M7741 Metatarsalgia, right foot: Secondary | ICD-10-CM | POA: Diagnosis not present

## 2020-01-22 DIAGNOSIS — M79671 Pain in right foot: Secondary | ICD-10-CM | POA: Diagnosis not present

## 2020-02-12 DIAGNOSIS — Q742 Other congenital malformations of lower limb(s), including pelvic girdle: Secondary | ICD-10-CM | POA: Diagnosis not present

## 2020-02-12 DIAGNOSIS — L84 Corns and callosities: Secondary | ICD-10-CM | POA: Diagnosis not present

## 2020-02-19 DIAGNOSIS — M659 Synovitis and tenosynovitis, unspecified: Secondary | ICD-10-CM | POA: Diagnosis not present

## 2020-02-19 DIAGNOSIS — M2012 Hallux valgus (acquired), left foot: Secondary | ICD-10-CM | POA: Diagnosis not present

## 2020-02-22 DIAGNOSIS — I10 Essential (primary) hypertension: Secondary | ICD-10-CM | POA: Diagnosis not present

## 2020-02-22 DIAGNOSIS — R0789 Other chest pain: Secondary | ICD-10-CM | POA: Diagnosis not present

## 2020-02-22 DIAGNOSIS — M542 Cervicalgia: Secondary | ICD-10-CM | POA: Diagnosis not present

## 2020-02-22 DIAGNOSIS — M7741 Metatarsalgia, right foot: Secondary | ICD-10-CM | POA: Diagnosis not present

## 2020-02-22 DIAGNOSIS — H539 Unspecified visual disturbance: Secondary | ICD-10-CM | POA: Diagnosis not present

## 2020-02-22 DIAGNOSIS — G894 Chronic pain syndrome: Secondary | ICD-10-CM | POA: Diagnosis not present

## 2020-02-22 DIAGNOSIS — E785 Hyperlipidemia, unspecified: Secondary | ICD-10-CM | POA: Diagnosis not present

## 2020-02-22 DIAGNOSIS — G8918 Other acute postprocedural pain: Secondary | ICD-10-CM | POA: Diagnosis not present

## 2020-02-22 DIAGNOSIS — D649 Anemia, unspecified: Secondary | ICD-10-CM | POA: Diagnosis not present

## 2020-02-22 DIAGNOSIS — R61 Generalized hyperhidrosis: Secondary | ICD-10-CM | POA: Diagnosis not present

## 2020-02-22 DIAGNOSIS — I25118 Atherosclerotic heart disease of native coronary artery with other forms of angina pectoris: Secondary | ICD-10-CM | POA: Diagnosis not present

## 2020-03-10 DIAGNOSIS — I200 Unstable angina: Secondary | ICD-10-CM | POA: Diagnosis not present

## 2020-03-10 DIAGNOSIS — I25118 Atherosclerotic heart disease of native coronary artery with other forms of angina pectoris: Secondary | ICD-10-CM | POA: Diagnosis not present

## 2020-03-10 DIAGNOSIS — E785 Hyperlipidemia, unspecified: Secondary | ICD-10-CM | POA: Diagnosis not present

## 2020-03-10 DIAGNOSIS — I10 Essential (primary) hypertension: Secondary | ICD-10-CM | POA: Diagnosis not present

## 2020-03-10 DIAGNOSIS — Z951 Presence of aortocoronary bypass graft: Secondary | ICD-10-CM | POA: Diagnosis not present

## 2020-03-31 DIAGNOSIS — D649 Anemia, unspecified: Secondary | ICD-10-CM | POA: Diagnosis not present

## 2020-04-14 ENCOUNTER — Inpatient Hospital Stay: Payer: Medicaid Other

## 2020-04-14 DIAGNOSIS — R3121 Asymptomatic microscopic hematuria: Secondary | ICD-10-CM | POA: Diagnosis not present

## 2020-04-14 DIAGNOSIS — K5900 Constipation, unspecified: Secondary | ICD-10-CM | POA: Diagnosis not present

## 2020-04-14 DIAGNOSIS — D649 Anemia, unspecified: Secondary | ICD-10-CM | POA: Diagnosis not present

## 2020-04-14 DIAGNOSIS — Z0000 Encounter for general adult medical examination without abnormal findings: Secondary | ICD-10-CM | POA: Diagnosis not present

## 2020-04-14 DIAGNOSIS — Z113 Encounter for screening for infections with a predominantly sexual mode of transmission: Secondary | ICD-10-CM | POA: Diagnosis not present

## 2020-04-14 DIAGNOSIS — Z23 Encounter for immunization: Secondary | ICD-10-CM | POA: Diagnosis not present

## 2020-04-14 DIAGNOSIS — E559 Vitamin D deficiency, unspecified: Secondary | ICD-10-CM | POA: Diagnosis not present

## 2020-04-14 DIAGNOSIS — I10 Essential (primary) hypertension: Secondary | ICD-10-CM | POA: Diagnosis not present

## 2020-04-14 DIAGNOSIS — E785 Hyperlipidemia, unspecified: Secondary | ICD-10-CM | POA: Diagnosis not present

## 2020-04-17 ENCOUNTER — Inpatient Hospital Stay: Payer: Medicaid Other | Admitting: Hematology and Oncology

## 2020-04-28 DIAGNOSIS — H2513 Age-related nuclear cataract, bilateral: Secondary | ICD-10-CM | POA: Diagnosis not present

## 2020-08-04 ENCOUNTER — Other Ambulatory Visit: Payer: Self-pay

## 2020-08-04 ENCOUNTER — Ambulatory Visit: Payer: Medicare Other | Attending: Physician Assistant | Admitting: Physical Therapy

## 2020-08-04 DIAGNOSIS — M542 Cervicalgia: Secondary | ICD-10-CM | POA: Diagnosis not present

## 2020-08-04 DIAGNOSIS — Z9181 History of falling: Secondary | ICD-10-CM

## 2020-08-04 DIAGNOSIS — M6281 Muscle weakness (generalized): Secondary | ICD-10-CM

## 2020-08-04 DIAGNOSIS — R29898 Other symptoms and signs involving the musculoskeletal system: Secondary | ICD-10-CM

## 2020-08-04 DIAGNOSIS — R293 Abnormal posture: Secondary | ICD-10-CM

## 2020-08-04 DIAGNOSIS — M25512 Pain in left shoulder: Secondary | ICD-10-CM

## 2020-08-04 NOTE — Therapy (Signed)
[facility] [place] [address]  [address] [place], [place], [address] Phone: [phone]   Fax:  [phone]  Physical Therapy Evaluation  Patient Details  Name: Diana [name] MRN: [id number] Date of Birth: 10/13/1959 Referring Provider (PT): Porfirio Oarhelle Jeffery, [place]   Encounter Date: 08/04/2020   PT End of Session - 08/04/20 0845    Visit Number 1    Number of Visits 12    Date for PT Re-Evaluation 09/15/20    Authorization Type [employer] Medicare & [employer]    PT Start Time 0845    PT Stop Time 0935    PT Time Calculation (min) 50 min    Activity Tolerance Patient tolerated treatment well    Behavior [facility] for tasks assessed/performed           Past Medical History:  Diagnosis Date   Allergy    Arthritis    feet   Bronchitis 10/30/2016   Carpal tunnel syndrome    Coronary artery disease    GERD (gastroesophageal reflux disease)    Hyperlipidemia    Hypertension    Non-ST elevation (NSTEMI) myocardial infarction (HCC) 10/30/2016   NSTEMI (non-ST elevated myocardial infarction) (HCC) 10/30/2016    Past Surgical History:  Procedure Laterality Date   CARPAL TUNNEL RELEASE Left 02/12/2016   Procedure: LEFT CARPAL TUNNEL RELEASE;  Surgeon: Cindee SaltGary Kuzma, MD;  Location: [facility];  Service: Orthopedics;  Laterality: Left;  FAB   CORONARY ARTERY BYPASS GRAFT N/A 01/10/2018   Procedure: CORONARY ARTERY BYPASS GRAFTING (CABG) TIMES 4, USING LEFT INTERNAL MAMMARY ARTERY TO OM1  AND ENDOSCOPICALLY HARVESTED RIGHT SAPHENOUS VEIN TO DIAGONAL, AND SEQUENTIALLY TO OM2 AND DISTAL CIRCUMFLEX;  Surgeon: Delight OvensGerhardt, Edward B, MD;  Location: [facility] OR;  Service: Open Heart Surgery;  Laterality: N/A;   LEFT HEART CATH AND CORONARY ANGIOGRAPHY N/A 01/05/2018   Procedure: LEFT HEART CATH AND CORONARY ANGIOGRAPHY;  Surgeon: Lennette BihariKelly, Thomas A, MD;  Location: [facility];  Service: Cardiovascular;  Laterality:  N/A;   TEE WITHOUT CARDIOVERSION N/A 01/10/2018   Procedure: TRANSESOPHAGEAL ECHOCARDIOGRAM (TEE);  Surgeon: Delight OvensGerhardt, Edward B, MD;  [facility];  Service: Open Heart Surgery;  Laterality: N/A;   TONSILLECTOMY     TUBAL LIGATION     WRIST SURGERY Right     There were no vitals filed for this visit.    Subjective Assessment - 08/04/20 0847    Subjective Pt reports she fell last month (falling back on stairs and landing on the L side of her back) which has re-triggered her neck and chest wall pain for which she had completed a prior PT episode. Since fall, she has been having pain in L>R neck and upper shoulder. Also still notes she is stil having some chest wall pain but that does not seem worse since fall. She reports she had a cardiac stress test last week and will meet with th MD on 08/12/20 to go over the results. She also notes she has been stumbling more around her home.    Pertinent History CABG x 4 - 01/10/18    Limitations Sitting;Standing;Lifting;House hold activities    How long can you sit comfortably? 30 minutes    How long can you stand comfortably? 15 minutes    Patient Stated Goals "better movement with less pain as possible to be able to do things like reach and make up a bed"    Currently in Pain? Yes  Pain Score 7     Pain Location Neck    Pain Orientation Left    Pain Descriptors / Indicators Aching    Pain Type Acute pain;Chronic pain    Pain Radiating Towards toward L scapula and across L shoulder    Pain Onset 1 to 4 weeks ago   ~1 month   Pain Frequency Intermittent    Aggravating Factors  prolonged sitting, foward bending, carrying things (esp up stairs)    Pain Relieving Factors sitting in recliner with neck support and leg elevated, Tylenol, prescription pain meds, hot shower, TENS unit    Effect of Pain on Daily Activities has to keep grocery bags very light; discomfort with sweeping              [facility] PT Assessment - 08/04/20 0845       Assessment   Medical Diagnosis Cervicalgia, shoulder pain, chest wall pain following surgery (CABG)    Referring Provider (PT) Porfirio Oar, PA-C    Onset Date/Surgical Date --   ~1 month   Hand Dominance Right    Next MD Visit 10/14/20    Prior Therapy PT ~ 1 yr ago for similar pain issues      Precautions   Precautions None      Balance Screen   Has the patient fallen in the past 6 months Yes    How many times? 2    Has the patient had a decrease in activity level because of a fear of falling?  No    Is the patient reluctant to leave their home because of a fear of falling?  No      Home Environment   Living Environment Private residence    Living Arrangements Alone    Type of Home Apartment    Home Access Stairs to enter    Entrance Stairs-Number of Steps ~16 with landing mid way    Entrance Stairs-Rails Right;Left;Cannot reach both    Home Layout One level      Prior Function   Level of Independence Independent    Vocation Unemployed;On disability   since surgery   Leisure online exercises 2x/wk, walking 3 days/week; recently got a gym membership at [employer] but has not started yet      Posture/Postural Control   Posture/Postural Control Postural limitations    Postural Limitations Forward head;Rounded Shoulders    Posture Comments L shoulder mildly elevated      AROM   Right Shoulder Flexion 136 Degrees    Right Shoulder ABduction 142 Degrees    Right Shoulder Internal Rotation --   FIR to L1   Right Shoulder External Rotation --   FER to T2   Left Shoulder Flexion 134 Degrees    Left Shoulder ABduction 135 Degrees    Left Shoulder Internal Rotation --   FIR to L1   Left Shoulder External Rotation --   FER to T2   Cervical Flexion 46 - stretching in paraspinals    Cervical Extension 33 - pain  in neck    Cervical - Right Side Bend 34    Cervical - Left Side Bend 20 - pain in neck & L shoulder    Cervical - Right Rotation 51    Cervical - Left Rotation 34 - pain in  neck & L shoulder      Strength   Right Shoulder Flexion 4/5   slight pain   Right Shoulder ABduction 4/5   slight pain   Right  Shoulder Internal Rotation 4+/5    Right Shoulder External Rotation 4/5    Left Shoulder Flexion 4/5   slight pain   Left Shoulder ABduction 4/5   slight pain   Left Shoulder Internal Rotation 4/5    Left Shoulder External Rotation 4-/5      Palpation   Palpation comment ttp in L>R pecs, UT, LS & periscapular muscles                      Objective measurements completed on examination: See above findings.               PT Education - 08/04/20 0930    Education Details PT eval findings, anticipated POC & initial HEP - Access Code: [email or web address]    Person(s) Educated Patient    Methods Explanation;Demonstration;Verbal cues;Handout    Comprehension Verbalized understanding;Verbal cues required;Returned demonstration;Need further instruction            PT Short Term Goals - 08/04/20 0935      PT SHORT TERM GOAL #1   Title Patient will be independent with initial HEP    Status New    Target Date 08/18/20             PT Long Term Goals - 08/04/20 0935      PT LONG TERM GOAL #1   Title Patient will be independent with ongoing/advanced HEP +/- gym program for self-management at home    Status New    Target Date 09/15/20      PT LONG TERM GOAL #2   Title Improve posture and alignment with patient to demonstrate improved upright posture with posterior shoulder girdle engaged    Status New    Target Date 09/15/20      PT LONG TERM GOAL #3   Title Decrease neck, shoulder and chest wall pain by >/= 50-75% allowing patient increased ease of daily household tasks    Status New    Target Date 09/15/20      PT LONG TERM GOAL #4   Title Patient to improve cervical and B shoulder AROM to WFL/WNL without pain provocation    Status New    Target Date 09/15/20      PT LONG TERM GOAL #5   Title Patient to report ability perform  ADLs and household tasks without pain interference    Status New    Target Date 09/15/20                  Plan - 08/04/20 1243    Clinical Impression Statement Analeise is a 65 y/o female who presents to OP PT for L>R sided neck and shoulder pain along with chest wall pain resulting from a fall ~1 month ago where she fell on the stairs at her apartment complex. She has previously completed a PT episode at this location ~1 yr ago for atypical chest pain presumed to be of musculoskeletal origin by her cardiologist during which time she also noted some neck and shoulder pain that we also addressed. Current deficits include postural abnormalities, limited and painful cervical and B shoulder AROM, cervical hypomobility and ttp with increased muscle tension and TPs t/o cervical paraspinals, L>R UT, LS, scalenes, periscapular muscles as well as B pecs, along with mild L>R shoulder weakness. Pain and increased muscle tension/LOM make cleaning/sweeping her home difficult and limit her ability to lift and carry things such as groceries. Davanna will benefit from skilled PT to address above deficits  and current functional limitations as well as to decrease pain interference with daily activities. Given 2 falls in past 6 months, Shizue may also benefit from further balance assessment and treatment to reduce risk for future falls.    Comorbidities CAD s/p NSTEMI 10/30/16 & CABG x 4 01/10/18; HTN; cervicalgia; carpal tunnel syndrome s/p CTR 02/12/16; GERD; bronchitis    Examination-Activity Limitations Carry;Lift;Reach Overhead;Sleep    Examination-Participation Restrictions Cleaning;Community Activity;Laundry;Shop    Stability/Clinical Decision Making Evolving/Moderate complexity    Clinical Decision Making Moderate    Rehab Potential Good    PT Frequency 2x / week    PT Duration 6 weeks    PT Treatment/Interventions ADLs/Self Care Home Management;Cryotherapy;Electrical Stimulation;Iontophoresis 4mg /ml  Dexamethasone;Moist Heat;Ultrasound;Functional mobility training;Therapeutic activities;Therapeutic exercise;Neuromuscular re-education;Balance training;Patient/family education;Manual techniques;Scar mobilization;Passive range of motion;Dry needling;Taping;Vasopneumatic Device    PT Next Visit Plan Review initial HEP; progress postural stretches and strengthening; consider balance assessment 2 2 recent falls    PT Home Exercise Plan MedBridge Access Code: [email or web address] (3/7)    Consulted and Agree with Plan of Care Patient           Patient will benefit from skilled therapeutic intervention in order to improve the following deficits and impairments:  Cardiopulmonary status limiting activity,Decreased activity tolerance,Decreased mobility,Decreased range of motion,Decreased scar mobility,Decreased strength,Hypomobility,Increased fascial restricitons,Increased muscle spasms,Impaired perceived functional ability,Impaired flexibility,Impaired UE functional use,Improper body mechanics,Postural dysfunction,Pain,Decreased balance,Decreased endurance,Difficulty walking  Visit Diagnosis: Cervicalgia  Acute pain of left shoulder  Other symptoms and signs involving the musculoskeletal system  Abnormal posture  Muscle weakness (generalized)  History of falling     Problem List Patient Active Problem List   Diagnosis Date Noted   Normocytic anemia 10/15/2019   Dysphagia 09/17/2019   Costochondritis 06/07/2019   Seasonal allergic rhinitis due to pollen 11/03/2018   Arthropathy of cervical facet joint 11/01/2018   Neural foraminal stenosis of cervical spine 11/01/2018   Atypical chest pain 10/24/2018   Adjustment disorder with mixed anxiety and depressed mood 09/05/2018   Insomnia 03/03/2018   S/P CABG x 4 01/10/2018   Hyperlipidemia 01/09/2018   Unstable angina (HCC) 01/05/2018   CAD (coronary artery disease), native coronary artery 12/28/2017   Asymptomatic microscopic  hematuria 11/14/2017   Iron deficiency anemia 09/05/2017   Cervicalgia 12/18/2015   Benign essential HTN 04/08/2015   Metatarsalgia of right foot 02/11/2015    02/13/2015, PT, MPT 08/04/2020, 2:43 PM  [facility] [address]  [address] [place], [place], [place] Phone: [phone]   Fax:  [phone]  Name: SANIA NOY MRN: Diana Musca Date of Birth: 1956/01/13

## 2020-08-04 NOTE — Patient Instructions (Addendum)
     Access Code: [email or web address] URL: [email or web address] Date: 08/04/2020 Prepared by: Glenetta Hew  Exercises Seated Gentle Upper Trapezius Stretch - 2-3 x daily - 7 x weekly - 3 reps - 30 sec hold Gentle Levator Scapulae Stretch - 2-3 x daily - 7 x weekly - 3 reps - 30 sec hold Seated Cervical Retraction - 2 x daily - 7 x weekly - 2 sets - 10 reps - 3-5 sec hold Seated Scapular Retraction - 2 x daily - 7 x weekly - 2 sets - 10 reps - 3-5 sec hold

## 2020-08-06 ENCOUNTER — Other Ambulatory Visit: Payer: Self-pay

## 2020-08-06 ENCOUNTER — Ambulatory Visit: Payer: Medicare Other | Admitting: Physical Therapy

## 2020-08-06 ENCOUNTER — Encounter: Payer: Self-pay | Admitting: Physical Therapy

## 2020-08-06 DIAGNOSIS — R293 Abnormal posture: Secondary | ICD-10-CM

## 2020-08-06 DIAGNOSIS — R29898 Other symptoms and signs involving the musculoskeletal system: Secondary | ICD-10-CM

## 2020-08-06 DIAGNOSIS — M542 Cervicalgia: Secondary | ICD-10-CM

## 2020-08-06 DIAGNOSIS — M25512 Pain in left shoulder: Secondary | ICD-10-CM

## 2020-08-06 DIAGNOSIS — M6281 Muscle weakness (generalized): Secondary | ICD-10-CM

## 2020-08-06 DIAGNOSIS — Z9181 History of falling: Secondary | ICD-10-CM

## 2020-08-06 NOTE — Patient Instructions (Addendum)
     Access Code: [email or web address] URL: [email or web address] Date: 08/06/2020 Prepared by: Glenetta Hew  Exercises Doorway Pec Stretch at 60 Degrees Abduction with Arm Straight - 2-3 x daily - 7 x weekly - 3 reps - 30 sec hold Single Arm Doorway Pec Stretch at 60 Elevation - 2-3 x daily - 7 x weekly - 3 reps - 30 sec hold Single Arm Doorway Pec Stretch at 120 Degrees Abduction - 2-3 x daily - 7 x weekly - 3 reps - 30 sec hold Standing Bilateral Low Shoulder Row with Anchored Resistance - 1 x daily - 7 x weekly - 2 sets - 10 reps - 5 sec hold Scapular Retraction with Resistance Advanced - 1 x daily - 7 x weekly - 2 sets - 10 reps - 5 sec hold     Kinesiology tape  What is kinesiology tape?  There are many brands of kinesiology tape. KTape, [employer], [employer], Dynamic tape, to name a few.  It is an elasticized tape designed to support the body's natural healing process. This tape provides stability and support to muscles and joints without restricting motion.  It can also help decrease swelling in the area of application.  How does it work?  The tape microscopically lifts and decompresses the skin to allow for drainage of lymph (swelling) to flow away from area, reducing inflammation. The tape has the ability to help re-educate the neuromuscular system by targeting specific receptors in the skin. The presence of the tape increases the body's awareness of posture and body mechanics.  Do not use with:  . Open wounds . Skin lesions . Adhesive allergies  In some rare cases, mild/moderate skin irritation can occur. This can include redness, itchiness, or hives. If this occurs, immediately remove tape and consult your primary care physician if symptoms are severe or do not resolve within 2 days.  Safe removal of the tape:  To remove tape safely, hold nearby skin with one hand and gentle roll tape down with other hand. You can apply oil or conditioner to tape while in shower  prior to removal to loosen adhesive. DO NOT swiftly rip tape off like a band-aid, as this could cause skin tears and additional skin irritation.     For questions, please contact your therapist at:  [facility] [address]  [address] [place], [place], [address] Phone: [phone]   Fax:  [phone]

## 2020-08-06 NOTE — Therapy (Signed)
[facility] [address]  [address] [place], [place], [address] Phone: [phone]   Fax:  [phone]  Physical Therapy Treatment  Patient Details  Name: ABIAGEAL BLOWE MRN: [id number] Date of Birth: 04/04/1956 Referring Provider (PT): Porfirio Oar, [place]   Encounter Date: 08/06/2020   PT End of Session - 08/06/20 0806    Visit Number 2    Number of Visits 12    Date for PT Re-Evaluation 09/15/20    Authorization Type UHC Medicare & [employer] [place] Medicaid    PT Start Time [phone]    PT Stop Time 0858    PT Time Calculation (min) 52 min    Activity Tolerance Patient tolerated treatment well    Behavior During Therapy [facility] for tasks assessed/performed           Past Medical History:  Diagnosis Date  . Allergy   . Arthritis    feet  . Bronchitis 10/30/2016  . Carpal tunnel syndrome   . Coronary artery disease   . GERD (gastroesophageal reflux disease)   . Hyperlipidemia   . Hypertension   . Non-ST elevation (NSTEMI) myocardial infarction (HCC) 10/30/2016  . NSTEMI (non-ST elevated myocardial infarction) (HCC) 10/30/2016    Past Surgical History:  Procedure Laterality Date  . CARPAL TUNNEL RELEASE Left 02/12/2016   Procedure: LEFT CARPAL TUNNEL RELEASE;  Surgeon: Cindee Salt, MD;  Location: [facility];  Service: Orthopedics;  Laterality: Left;  FAB  . CORONARY ARTERY BYPASS GRAFT N/A 01/10/2018   Procedure: CORONARY ARTERY BYPASS GRAFTING (CABG) TIMES 4, USING LEFT INTERNAL MAMMARY ARTERY TO OM1  AND ENDOSCOPICALLY HARVESTED RIGHT SAPHENOUS VEIN TO DIAGONAL, AND SEQUENTIALLY TO OM2 AND DISTAL CIRCUMFLEX;  Surgeon: Delight Ovens, MD;  Location: [facility] OR;  Service: Open Heart Surgery;  Laterality: N/A;  . LEFT HEART CATH AND CORONARY ANGIOGRAPHY N/A 01/05/2018   Procedure: LEFT HEART CATH AND CORONARY ANGIOGRAPHY;  Surgeon: Lennette Bihari, MD;  Location: [facility] INVASIVE CV LAB;  Service: Cardiovascular;  Laterality: N/A;   . TEE WITHOUT CARDIOVERSION N/A 01/10/2018   Procedure: TRANSESOPHAGEAL ECHOCARDIOGRAM (TEE);  Surgeon: Delight Ovens, MD;  Location: [facility] OR;  Service: Open Heart Surgery;  Laterality: N/A;  . TONSILLECTOMY    . TUBAL LIGATION    . WRIST SURGERY Right     There were no vitals filed for this visit.   Subjective Assessment - 08/06/20 0810    Pertinent History CABG x 4 - 01/10/18    Patient Stated Goals "better movement with less pain as possible to be able to do things like reach and make up a bed"    Currently in Pain? Yes    Pain Score 6     Pain Location Neck   & L shoulder   Pain Orientation Mid;Left    Pain Descriptors / Indicators Aching    Pain Type Acute pain;Chronic pain    Pain Frequency Intermittent                             [facility] Adult PT Treatment/Exercise - 08/06/20 0806      Exercises   Exercises Shoulder      Neck Exercises: Seated   Neck Retraction 10 reps;5 secs    Neck Retraction Limitations cues to avoid shoulder shrug      Shoulder Exercises: Seated   Retraction Both;10 reps;Strengthening    Retraction Limitations scap retraction + depression  Shoulder Exercises: Standing   Row Both;10 reps;Strengthening;Theraband    Theraband Level (Shoulder Row) Level 1 (Yellow)    Row Limitations cues for scap retraction & depression avoiding shoulder shrug    Retraction Both;10 reps;Strengthening;Theraband    Theraband Level (Shoulder Retraction) Level 1 (Yellow)    Retraction Limitations + mini-shoulder extension      Shoulder Exercises: ROM/Strengthening   UBE (Upper Arm Bike) L1.0 x 6 min (3' fwd/3' back)      Shoulder Exercises: Stretch   Other Shoulder Stretches L/R single arm low/mid/high pecs stretches x 30 sec each      Manual Therapy   Manual Therapy Soft tissue mobilization;Myofascial release;Taping    Soft tissue mobilization STM to B lower cervical paraspinals, UT/LS and periscapular muscles (L>R) - very ttp in UT     Myofascial Release manual TPR to L UT - limited tolerance/release    Kinesiotex Inhibit Muscle      Kinesiotix   Inhibit Muscle  B UT & LS - 30%   black KinesioTex     Neck Exercises: Stretches   Upper Trapezius Stretch Left;Right;2 reps;30 seconds    Upper Trapezius Stretch Limitations hand tucked under hip to prevent shoulder shrug    Levator Stretch Left;Right;2 reps;30 seconds    Levator Stretch Limitations hand tucked under/behind hip to maintain neutral shoulder                  PT Education - 08/06/20 0858    Education Details HEP update - Access Code: [email or web address]; Review of kinesiotape wearing and removal instructions    Person(s) Educated Patient    Methods Explanation;Demonstration;Verbal cues;Tactile cues;Handout    Comprehension Verbalized understanding;Verbal cues required;Tactile cues required;Returned demonstration;Need further instruction            PT Short Term Goals - 08/06/20 0810      PT SHORT TERM GOAL #1   Title Patient will be independent with initial HEP    Status On-going    Target Date 08/18/20             PT Long Term Goals - 08/06/20 0810      PT LONG TERM GOAL #1   Title Patient will be independent with ongoing/advanced HEP +/- gym program for self-management at home    Status On-going    Target Date 09/15/20      PT LONG TERM GOAL #2   Title Improve posture and alignment with patient to demonstrate improved upright posture with posterior shoulder girdle engaged    Status On-going    Target Date 09/15/20      PT LONG TERM GOAL #3   Title Decrease neck, shoulder and chest wall pain by >/= 50-75% allowing patient increased ease of daily household tasks    Status On-going    Target Date 09/15/20      PT LONG TERM GOAL #4   Title Patient to improve cervical and B shoulder AROM to WFL/WNL without pain provocation    Status On-going    Target Date 09/15/20      PT LONG TERM GOAL #5   Title Patient to report ability perform ADLs  and household tasks without pain interference    Status On-going    Target Date 09/15/20                 Plan - 08/06/20 7829    Clinical Impression Statement Pam reports no concerns with initial HEP and only required very minor clarifications for positioning and  avoidance of extraneous movements upon HEP review. Introduced Environmental manager - pt noting better tolerance for single arm and preferring slight rotation away rather than fwd step to create stretch. Progressed scapular strengthening with yellow TB targeting scap retraction and depression to reduce elevated shoulders and associated tension in UT & LS. HEP updated to reflect stretching and strengthening progression. Pt remains very guarded in upper shouder musculature but limited tolerance for manual STM/TPR today, therefore trialed Ktape application to B UT/LS to reduce muscle tension as pt had responded positively to taping in the past.    Comorbidities CAD s/p NSTEMI 10/30/16 & CABG x 4 01/10/18; HTN; cervicalgia; carpal tunnel syndrome s/p CTR 02/12/16; GERD; bronchitis    Examination-Activity Limitations Carry;Lift;Reach Overhead;Sleep    Examination-Participation Restrictions Cleaning;Community Activity;Laundry;Shop    Rehab Potential Good    PT Frequency 2x / week    PT Duration 6 weeks    PT Treatment/Interventions ADLs/Self Care Home Management;Cryotherapy;Electrical Stimulation;Iontophoresis 4mg /ml Dexamethasone;Moist Heat;Ultrasound;Functional mobility training;Therapeutic activities;Therapeutic exercise;Neuromuscular re-education;Balance training;Patient/family education;Manual techniques;Scar mobilization;Passive range of motion;Dry needling;Taping;Vasopneumatic Device    PT Next Visit Plan Review initial HEP; progress postural stretches and strengthening; consider balance assessment 2 2 recent falls    PT Home Exercise Plan MedBridge Access Code: [email or web address] (3/7)    Consulted and Agree with Plan of Care  Patient           Patient will benefit from skilled therapeutic intervention in order to improve the following deficits and impairments:  Cardiopulmonary status limiting activity,Decreased activity tolerance,Decreased mobility,Decreased range of motion,Decreased scar mobility,Decreased strength,Hypomobility,Increased fascial restricitons,Increased muscle spasms,Impaired perceived functional ability,Impaired flexibility,Impaired UE functional use,Improper body mechanics,Postural dysfunction,Pain,Decreased balance,Decreased endurance,Difficulty walking  Visit Diagnosis: Cervicalgia  Acute pain of left shoulder  Other symptoms and signs involving the musculoskeletal system  Abnormal posture  Muscle weakness (generalized)  History of falling     Problem List Patient Active Problem List   Diagnosis Date Noted  . Normocytic anemia 10/15/2019  . Dysphagia 09/17/2019  . Costochondritis 06/07/2019  . Seasonal allergic rhinitis due to pollen 11/03/2018  . Arthropathy of cervical facet joint 11/01/2018  . Neural foraminal stenosis of cervical spine 11/01/2018  . Atypical chest pain 10/24/2018  . Adjustment disorder with mixed anxiety and depressed mood 09/05/2018  . Insomnia 03/03/2018  . S/P CABG x 4 01/10/2018  . Hyperlipidemia 01/09/2018  . Unstable angina (HCC) 01/05/2018  . CAD (coronary artery disease), native coronary artery 12/28/2017  . Asymptomatic microscopic hematuria 11/14/2017  . Iron deficiency anemia 09/05/2017  . Cervicalgia 12/18/2015  . Benign essential HTN 04/08/2015  . Metatarsalgia of right foot 02/11/2015    02/13/2015, PT, MPT 08/06/2020, 9:14 AM  [facility] [address]  [address] [place], [place], [place] Phone: [phone]   Fax:  [phone]  Name: ALEK PONCEDELEON MRN: Patience Musca Date of Birth: April 12, 1956

## 2020-08-11 ENCOUNTER — Ambulatory Visit: Payer: Medicare Other | Admitting: Physical Therapy

## 2020-08-13 ENCOUNTER — Other Ambulatory Visit: Payer: Self-pay

## 2020-08-13 ENCOUNTER — Ambulatory Visit: Payer: Medicare Other | Admitting: Physical Therapy

## 2020-08-13 ENCOUNTER — Encounter: Payer: Self-pay | Admitting: Physical Therapy

## 2020-08-13 VITALS — BP 128/78 | HR 54

## 2020-08-13 DIAGNOSIS — Z9181 History of falling: Secondary | ICD-10-CM

## 2020-08-13 DIAGNOSIS — R29898 Other symptoms and signs involving the musculoskeletal system: Secondary | ICD-10-CM

## 2020-08-13 DIAGNOSIS — M542 Cervicalgia: Secondary | ICD-10-CM | POA: Diagnosis not present

## 2020-08-13 DIAGNOSIS — M25512 Pain in left shoulder: Secondary | ICD-10-CM

## 2020-08-13 DIAGNOSIS — R293 Abnormal posture: Secondary | ICD-10-CM

## 2020-08-13 DIAGNOSIS — M6281 Muscle weakness (generalized): Secondary | ICD-10-CM

## 2020-08-13 NOTE — Therapy (Addendum)
[facility] [address]  [place], [place], [address] Phone: [phone]   Fax:  [phone]  Physical Therapy Treatment / Discharge Summary  Patient Details  Name: Diana [name] MRN: [id number] Date of Birth: August 05, 1955 Referring Provider (PT): Harrison Mons, [place]   Encounter Date: 08/13/2020   PT End of Session - 08/13/20 0801    Visit Number 3    Number of Visits 12    Date for PT Re-Evaluation 09/15/20    Authorization Type UHC Medicare & [employer] [place] Medicaid    PT Start Time 0801    PT Stop Time 0847    PT Time Calculation (min) 46 min    Activity Tolerance Patient tolerated treatment well    Behavior During Therapy [facility] for tasks assessed/performed           Past Medical History:  Diagnosis Date  . Allergy   . Arthritis    feet  . Bronchitis 10/30/2016  . Carpal tunnel syndrome   . Coronary artery disease   . GERD (gastroesophageal reflux disease)   . Hyperlipidemia   . Hypertension   . Non-ST elevation (NSTEMI) myocardial infarction ([place]) 10/30/2016  . NSTEMI (non-ST elevated myocardial infarction) ([place]) 10/30/2016    Past Surgical History:  Procedure Laterality Date  . CARPAL TUNNEL RELEASE Left 02/12/2016   Procedure: LEFT CARPAL TUNNEL RELEASE;  Surgeon: Daryll Brod, MD;  Location: [place];  Service: Orthopedics;  Laterality: Left;  FAB  . CORONARY ARTERY BYPASS GRAFT N/A 01/10/2018   Procedure: CORONARY ARTERY BYPASS GRAFTING (CABG) TIMES 4, USING LEFT INTERNAL MAMMARY ARTERY TO OM1  AND ENDOSCOPICALLY HARVESTED RIGHT SAPHENOUS VEIN TO DIAGONAL, AND SEQUENTIALLY TO OM2 AND DISTAL CIRCUMFLEX;  Surgeon: Grace Isaac, MD;  Location: [facility];  Service: Open Heart Surgery;  Laterality: N/A;  . LEFT HEART CATH AND CORONARY ANGIOGRAPHY N/A 01/05/2018   Procedure: LEFT HEART CATH AND CORONARY ANGIOGRAPHY;  Surgeon: Troy Sine, MD;  Location: [place] CV LAB;  Service:  Cardiovascular;  Laterality: N/A;  . TEE WITHOUT CARDIOVERSION N/A 01/10/2018   Procedure: TRANSESOPHAGEAL ECHOCARDIOGRAM (TEE);  Surgeon: Grace Isaac, MD;  Location: [place];  Service: Open Heart Surgery;  Laterality: N/A;  . TONSILLECTOMY    . TUBAL LIGATION    . WRIST SURGERY Right     Vitals:   08/13/20 0809  BP: 128/78  Pulse: (!) 54  SpO2: 97%     Subjective Assessment - 08/13/20 0814    Subjective Pt reports her recent cardiac stress test came back abnormal and she will need to have a cardiac cath with possible intervention next Tuesday. MD wants her to hold off on PT for now and anticipates she will start cardiac rehab after the procedure. Pt notes some benefit from kinesiotaping last visit but had difficulty removing the LS strips.    Pertinent History CABG x 4 - 01/10/18    Patient Stated Goals "better movement with less pain as possible to be able to do things like reach and make up a bed"    Currently in Pain? Yes    Pain Score 6     Pain Location Neck   & L shoulder   Pain Orientation Left    Pain Descriptors / Indicators Throbbing    Pain Type Acute pain;Chronic pain              [facility] PT Assessment - 08/13/20 0801  Assessment   Medical Diagnosis Cervicalgia, shoulder pain, chest wall pain following surgery (CABG)    Referring Provider (PT) Harrison Mons, PA-C    Onset Date/Surgical Date --   ~1 month   Hand Dominance Right    Next MD Visit 10/14/20      AROM   Right Shoulder Flexion 137 Degrees    Right Shoulder ABduction 130 Degrees   pulling in neck & shoulders   Left Shoulder Flexion 134 Degrees    Left Shoulder ABduction 124 Degrees   pulling in neck & shoulders   Cervical Flexion 45    Cervical Extension 38 - pain    Cervical - Right Side Bend 25 - pain & tightness    Cervical - Left Side Bend 18 - pain & tightness    Cervical - Right Rotation 51    Cervical - Left Rotation 45 - "pulling"                         [facility]  Adult PT Treatment/Exercise - 08/13/20 0801      Neck Exercises: Seated   Neck Retraction 10 reps;5 secs    Neck Retraction Limitations mirror feedback to avoid shoulder shrug      Shoulder Exercises: Seated   Retraction Both;10 reps;Strengthening    Retraction Limitations scap retraction + depression; mirror feedback to avoid shoulder shrug      Shoulder Exercises: Stretch   Other Shoulder Stretches L/R single arm low/mid/high pecs stretches x 30 sec each      Neck Exercises: Stretches   Upper Trapezius Stretch Left;Right;1 rep;30 seconds    Upper Trapezius Stretch Limitations hand tucked under hip to prevent shoulder shrug    Levator Stretch Left;Right;1 rep;30 seconds    Levator Stretch Limitations hand tucked under/behind hip to maintain neutral shoulder                    PT Short Term Goals - 08/13/20 0840      PT SHORT TERM GOAL #1   Title Patient will be independent with initial HEP    Status Achieved   08/13/20            PT Long Term Goals - 08/06/20 0810      PT LONG TERM GOAL #1   Title Patient will be independent with ongoing/advanced HEP +/- gym program for self-management at home    Status On-going    Target Date 09/15/20      PT LONG TERM GOAL #2   Title Improve posture and alignment with patient to demonstrate improved upright posture with posterior shoulder girdle engaged    Status On-going    Target Date 09/15/20      PT LONG TERM GOAL #3   Title Decrease neck, shoulder and chest wall pain by >/= 50-75% allowing patient increased ease of daily household tasks    Status On-going    Target Date 09/15/20      PT LONG TERM GOAL #4   Title Patient to improve cervical and B shoulder AROM to WFL/WNL without pain provocation    Status On-going    Target Date 09/15/20      PT LONG TERM GOAL #5   Title Patient to report ability perform ADLs and household tasks without pain interference    Status On-going    Target Date 09/15/20                  Plan -  08/13/20 0847    Clinical Impression Statement Pam reports she will have to take a break from PT at her cardiologist's request as she will be having a cardiac cath with possible intervention next Tuesday, after which she will likely be starting cardiac rehab. Given this, we discussed what PT HEP exercises would be appropriate for her to continue until cleared by MD to resume PT. Encouraged pt to focus on stretching and postural exercises w/o added resistance to reduce risk of Valsalva d/t potentially holding her breath or bearing down.  Pt able to perform good return demonstration but did use mirror feedback to avoid shoulder shrug with cervical and scapular retraction. Due to pending cardiac interventions, will place Pam on a 30-day hold for PT until cleared by MD to resume PT. If it will be >30 days before she is able to resume PT, we will require a new referral.    Comorbidities CAD s/p NSTEMI 10/30/16 & CABG x 4 01/10/18; HTN; cervicalgia; carpal tunnel syndrome s/p CTR 02/12/16; GERD; bronchitis    Examination-Activity Limitations Carry;Lift;Reach Overhead;Sleep    Examination-Participation Restrictions Cleaning;Community Activity;Laundry;Shop    Rehab Potential Good    PT Frequency 2x / week    PT Duration 6 weeks    PT Treatment/Interventions ADLs/Self Care Home Management;Cryotherapy;Electrical Stimulation;Iontophoresis 25m/ml Dexamethasone;Moist Heat;Ultrasound;Functional mobility training;Therapeutic activities;Therapeutic exercise;Neuromuscular re-education;Balance training;Patient/family education;Manual techniques;Scar mobilization;Passive range of motion;Dry needling;Taping;Vasopneumatic Device    PT Next Visit Plan 30-day hold d/t pending cardiac catheterization    PT Home Exercise Plan MedBridge Access Code: [email or web address] (3/7); [email or web address] (3/9)    Consulted and Agree with Plan of Care Patient           Patient will benefit from skilled therapeutic intervention in order  to improve the following deficits and impairments:  Cardiopulmonary status limiting activity,Decreased activity tolerance,Decreased mobility,Decreased range of motion,Decreased scar mobility,Decreased strength,Hypomobility,Increased fascial restricitons,Increased muscle spasms,Impaired perceived functional ability,Impaired flexibility,Impaired UE functional use,Improper body mechanics,Postural dysfunction,Pain,Decreased balance,Decreased endurance,Difficulty walking  Visit Diagnosis: Cervicalgia  Acute pain of left shoulder  Other symptoms and signs involving the musculoskeletal system  Abnormal posture  Muscle weakness (generalized)  History of falling     Problem List Patient Active Problem List   Diagnosis Date Noted  . Normocytic anemia 10/15/2019  . Dysphagia 09/17/2019  . Costochondritis 06/07/2019  . Seasonal allergic rhinitis due to pollen 11/03/2018  . Arthropathy of cervical facet joint 11/01/2018  . Neural foraminal stenosis of cervical spine 11/01/2018  . Atypical chest pain 10/24/2018  . Adjustment disorder with mixed anxiety and depressed mood 09/05/2018  . Insomnia 03/03/2018  . S/P CABG x 4 01/10/2018  . Hyperlipidemia 01/09/2018  . Unstable angina ([place] 01/05/2018  . CAD (coronary artery disease), native coronary artery 12/28/2017  . Asymptomatic microscopic hematuria 11/14/2017  . Iron deficiency anemia 09/05/2017  . Cervicalgia 12/18/2015  . Benign essential HTN 04/08/2015  . Metatarsalgia of right foot 02/11/2015    JPercival Spanish PT, MPT 08/13/2020, 10:43 AM  [facility] [place] [address]Phone: [phone]  Fax:  [phone] Name: PTILLA WILBORNMRN: [id number] of Birth: 8October 03, 1957  PHYSICAL THERAPY DISCHARGE SUMMARY  Visits from Start of Care: 3  Current functional level related to goals / functional outcomes:   Refer to above clinical  impression for status as of last visit on 08/13/2020. Patient was placed on hold for 30 days pending a scheduled cardiac catheterization and potential intervention with  anticipated cardiac rehab following this. She has not returned to PT in >30 days, therefore will proceed with discharge from PT for this episode.   Remaining deficits:   As above.   Education / Equipment:   HEP  Plan: Patient agrees to discharge.  Patient goals were partially met. Patient is being discharged due to a change in medical status.  ?????     Percival Spanish, PT, MPT 10/15/20, 9:02 AM  [facility] [address]  [place], [place], [address] Phone: [phone]   Fax:  [phone]

## 2020-08-18 ENCOUNTER — Encounter: Payer: Medicare HMO | Admitting: Physical Therapy

## 2020-08-20 ENCOUNTER — Encounter: Payer: Medicare HMO | Admitting: Physical Therapy

## 2020-08-25 ENCOUNTER — Encounter: Payer: Medicare HMO | Admitting: Physical Therapy

## 2020-08-28 ENCOUNTER — Encounter: Payer: Medicare HMO | Admitting: Physical Therapy

## 2020-09-01 ENCOUNTER — Encounter: Payer: Medicare Other | Admitting: Physical Therapy

## 2020-09-03 ENCOUNTER — Encounter: Payer: Medicare Other | Admitting: Physical Therapy

## 2020-09-09 ENCOUNTER — Encounter: Payer: Medicare Other | Admitting: Physical Therapy

## 2020-09-11 ENCOUNTER — Encounter: Payer: Medicare Other | Admitting: Physical Therapy

## 2020-09-15 ENCOUNTER — Encounter: Payer: Medicare Other | Admitting: Physical Therapy

## 2020-10-20 ENCOUNTER — Encounter: Payer: Self-pay | Admitting: Hematology and Oncology

## 2020-10-21 ENCOUNTER — Ambulatory Visit: Payer: Medicare Other | Attending: Physician Assistant | Admitting: Physical Therapy

## 2020-10-21 ENCOUNTER — Encounter: Payer: Self-pay | Admitting: Physical Therapy

## 2020-10-21 ENCOUNTER — Other Ambulatory Visit: Payer: Self-pay

## 2020-10-21 DIAGNOSIS — R29810 Facial weakness: Secondary | ICD-10-CM | POA: Diagnosis present

## 2020-10-21 DIAGNOSIS — M5442 Lumbago with sciatica, left side: Secondary | ICD-10-CM | POA: Insufficient documentation

## 2020-10-21 DIAGNOSIS — G8929 Other chronic pain: Secondary | ICD-10-CM | POA: Diagnosis present

## 2020-10-21 DIAGNOSIS — R29898 Other symptoms and signs involving the musculoskeletal system: Secondary | ICD-10-CM | POA: Insufficient documentation

## 2020-10-21 DIAGNOSIS — M6281 Muscle weakness (generalized): Secondary | ICD-10-CM

## 2020-10-21 DIAGNOSIS — R296 Repeated falls: Secondary | ICD-10-CM | POA: Diagnosis not present

## 2020-10-21 DIAGNOSIS — M5441 Lumbago with sciatica, right side: Secondary | ICD-10-CM | POA: Insufficient documentation

## 2020-10-21 DIAGNOSIS — R2681 Unsteadiness on feet: Secondary | ICD-10-CM | POA: Insufficient documentation

## 2020-10-21 NOTE — Therapy (Addendum)
[facility] [address]  [address] [place], [place], [address] Phone: [phone]   Fax:  [phone]  Physical Therapy Evaluation  Patient Details  Name: Diana [name] MRN: [id number] Date of Birth: [birth year] Referring Provider (PT): Porfirio Oar, [place]   Encounter Date: 10/21/2020   PT End of Session - 10/21/20 1443    Visit Number 1    Number of Visits 16    Date for PT Re-Evaluation 12/16/20    Authorization Type UHC Medicare & Medicaid [place]    PT Start Time 1443    PT Stop Time 1535    PT Time Calculation (min) 52 min    Activity Tolerance Patient tolerated treatment well    Behavior During Therapy [facility] for tasks assessed/performed           Past Medical History:  Diagnosis Date  . Allergy   . Arthritis    feet  . Bronchitis 10/30/2016  . Carpal tunnel syndrome   . Coronary artery disease   . GERD (gastroesophageal reflux disease)   . Hyperlipidemia   . Hypertension   . Non-ST elevation (NSTEMI) myocardial infarction (HCC) 10/30/2016  . NSTEMI (non-ST elevated myocardial infarction) (HCC) 10/30/2016    Past Surgical History:  Procedure Laterality Date  . CARPAL TUNNEL RELEASE Left 02/12/2016   Procedure: LEFT CARPAL TUNNEL RELEASE;  Surgeon: Cindee Salt, MD;  Location: [facility];  Service: Orthopedics;  Laterality: Left;  FAB  . CORONARY ARTERY BYPASS GRAFT N/A 01/10/2018   Procedure: CORONARY ARTERY BYPASS GRAFTING (CABG) TIMES 4, USING LEFT INTERNAL MAMMARY ARTERY TO OM1  AND ENDOSCOPICALLY HARVESTED RIGHT SAPHENOUS VEIN TO DIAGONAL, AND SEQUENTIALLY TO OM2 AND DISTAL CIRCUMFLEX;  Surgeon: Delight Ovens, MD;  Location: [facility] OR;  Service: Open Heart Surgery;  Laterality: N/A;  . LEFT HEART CATH AND CORONARY ANGIOGRAPHY N/A 01/05/2018   Procedure: LEFT HEART CATH AND CORONARY ANGIOGRAPHY;  Surgeon: Lennette Bihari, MD;  [facility];  Service: Cardiovascular;  Laterality: N/A;  . TEE  WITHOUT CARDIOVERSION N/A 01/10/2018   Procedure: TRANSESOPHAGEAL ECHOCARDIOGRAM (TEE);  Surgeon: Delight Ovens, MD;  Location: [facility] OR;  Service: Open Heart Surgery;  Laterality: N/A;  . TONSILLECTOMY    . TUBAL LIGATION    . WRIST SURGERY Right     There were no vitals filed for this visit.    Subjective Assessment - 10/21/20 1451    Subjective Pt reports she underwent the cardiac cath as planned but did not require any intervention, just a change in meds. When she inquired to cardiologist about resuming PT, he deferred to her PCP who referred her back to PT for deocnditioning and increased falls. She reports at least 4 recent falls (3 since the first of the year). Falls ususally proceeded by a stumble or LOB (2x while carrying something) but denies dizziness or lightheadedness. She reports falls have caused increase in knee pain as well as back/chest pain.    Pertinent History CABG x 4 - 01/10/18    Patient Stated Goals "to reduce the muscle tightness and feel steadier on my feet"    Currently in Pain? No/denies    Pain Score 0-No pain   up to 6/10   Pain Location Rib cage    Pain Orientation Right;Left    Pain Descriptors / Indicators Cramping    Pain Type Acute pain;Chronic pain    Pain Frequency Several days a week    Aggravating Factors  vacuuming, making the bed    Pain Score 0   up to 6/10   Pain Location Back    Pain Orientation Lower;Mid    Pain Descriptors / Indicators Tightness    Pain Type Chronic pain    Pain Radiating Towards pain/tightness at times in thighs and calves              [facility] - 10/21/20 1443      Assessment   Medical Diagnosis Deconditioning & frequent falls    Referring Provider (PT) Porfirio Oarhelle Jeffery, PA-C    Onset Date/Surgical Date --   ~6 months   Hand Dominance Right    Next MD Visit 11/26/20    Prior Therapy mutiple PT episodes      Precautions   Precautions Fall      Restrictions   Weight Bearing Restrictions No       Balance Screen   Has the patient fallen in the past 6 months Yes    How many times? 4    Has the patient had a decrease in activity level because of a fear of falling?  Yes    Is the patient reluctant to leave their home because of a fear of falling?  No      Home Environment   Living Environment Private residence    Living Arrangements Alone    Type of Home Apartment    Home Access Stairs to enter    Entrance Stairs-Number of Steps ~16 with landing mid way    Entrance Stairs-Rails Right;Left;Cannot reach both    Home Layout One level      Prior Function   Level of Independence Independent    Vocation Unemployed;On disability   since surgery   Leisure walking 2 days/wk alternating with aquatic pool exercise 2x/wk, online exercises on occasion      Cognition   Overall Cognitive Status Impaired/Different from baseline    Memory Impaired    Memory Impairment Decreased long term memory      Strength   Strength Assessment Site Hip;Knee;Ankle    Right/Left Hip Right;Left    Right Hip Flexion 3+/5    Right Hip Extension 2+/5    Right Hip External Rotation  3+/5    Right Hip Internal Rotation 2+/5    Right Hip ABduction 2+/5    Right Hip ADduction 2/5    Left Hip Flexion 4-/5    Left Hip Extension 2+/5    Left Hip External Rotation 3+/5    Left Hip Internal Rotation 3+/5    Left Hip ABduction 2/5    Left Hip ADduction 2/5    Right/Left Knee Right;Left    Right Knee Flexion 4-/5    Right Knee Extension 4/5    Left Knee Flexion 4-/5    Left Knee Extension 4/5    Right/Left Ankle Right;Left    Right Ankle Dorsiflexion 4-/5    Right Ankle Plantar Flexion 2/5    Left Ankle Dorsiflexion 4-/5    Left Ankle Plantar Flexion 2/5      Ambulation/Gait   Assistive device None    Gait Pattern Step-through pattern;Lateral hip instability;Poor foot clearance - left;Poor foot clearance - right   increased sway   Ambulation Surface Level;Indoor    Gait velocity 3.53 ft/sec       Standardized Balance Assessment   Standardized Balance Assessment Timed Up and Go Test;Five Times Sit to Stand;10 meter walk test    Five times sit to stand comments  23.09 sec   norm for ages 74-69 = 11.04 sec   10 Meter Walk 9.29 sec      Timed Up and Go Test   Normal TUG (seconds) 12.72      Functional Gait  Assessment   Gait assessed  Yes    Gait Level Surface Walks 20 ft in less than 7 sec but greater than 5.5 sec, uses assistive device, slower speed, mild gait deviations, or deviates 6-10 in outside of the 12 in walkway width.    Change in Gait Speed Able to change speed, demonstrates mild gait deviations, deviates 6-10 in outside of the 12 in walkway width, or no gait deviations, unable to achieve a major change in velocity, or uses a change in velocity, or uses an assistive device.    Gait with Horizontal Head Turns Performs head turns smoothly with slight change in gait velocity (eg, minor disruption to smooth gait path), deviates 6-10 in outside 12 in walkway width, or uses an assistive device.    Gait with Vertical Head Turns Performs task with slight change in gait velocity (eg, minor disruption to smooth gait path), deviates 6 - 10 in outside 12 in walkway width or uses assistive device    Gait and Pivot Turn Pivot turns safely within 3 sec and stops quickly with no loss of balance.    Step Over Obstacle Is able to step over one shoe box (4.5 in total height) without changing gait speed. No evidence of imbalance.    Gait with Narrow Base of Support Ambulates 7-9 steps.    Gait with Eyes Closed Walks 20 ft, slow speed, abnormal gait pattern, evidence for imbalance, deviates 10-15 in outside 12 in walkway width. Requires more than 9 sec to ambulate 20 ft.    Ambulating Backwards Walks 20 ft, slow speed, abnormal gait pattern, evidence for imbalance, deviates 10-15 in outside 12 in walkway width.    Steps Two feet to a stair, must use rail.    Total Score 18    FGA comment: < 19 = high  risk fall                      Objective measurements completed on examination: See above findings.                 PT Short Term Goals - 10/21/20 1535      PT SHORT TERM GOAL #1   Title Patient will be independent with initial HEP    Status New    Target Date 11/18/20             PT Long Term Goals - 10/21/20 1535      PT LONG TERM GOAL #1   Title Patient will be independent with ongoing/advanced HEP +/- gym program for self-management at home    Status New    Target Date 12/16/20      PT LONG TERM GOAL #2   Title Patient will demonstrate improved B LE strength to >/= 4/5 for improved stability and ease of mobility    Status New    Target Date 12/16/20      PT LONG TERM GOAL #3   Title Patient will decrease 5x STS time to </= 11.4 sec per age-appropriate norms    Status New    Target Date 12/16/20      PT LONG TERM GOAL #4   Title Patient will improve FGA score to >/= 25/30 to improve gait  stability and reduce risk for falls    Status New    Target Date 12/16/20      PT LONG TERM GOAL #5   Title Patient will report no further instances of falls    Status New    Target Date 12/16/20                  Plan - 10/21/20 1535    Clinical Impression Statement Orit is a 65 y/o female who presents to OP PT for deconditioning with frequent falls (4 falls in past 6 months). She had recently started a PT episode in this clinic in March for L>R sided neck and shoulder pain along with chest wall pain resulting from a fall ~1 month prior where she fell on the stairs at her apartment complex but had to stop after only 3 visits due to cardiac issues for which she underwent a cardiac catheterization which did not require intervention other than new meds. She reports pain from prior episode is now less common with no pain reported today, but some intermittent rib and back pain has recently been more of an issue. Current deficits include intermittent  pain, global B LE weakness (greatest at hips and ankles), recurrent falls with increased risk for falls as indicated by 5xSTS time of 23.09 sec and FGA score of 18/30. Mariell will benefit from skilled PT to address above deficits and current functional limitations to improve stability and decrease fall risk as well as to decrease pain interference with daily activities.    Personal Factors and Comorbidities Comorbidity 3+;Time since onset of injury/illness/exacerbation;Past/Current Experience;Fitness    Comorbidities CAD s/p NSTEMI 10/30/16 & CABG x 4 01/10/18; HTN; cervicalgia; carpal tunnel syndrome s/p CTR 02/12/16; GERD; bronchitis    Examination-Activity Limitations Locomotion Level;Stairs;Transfers    Examination-Participation Restrictions Cleaning;Community Activity;Laundry;Meal Prep;Shop    Stability/Clinical Decision Making Evolving/Moderate complexity    Clinical Decision Making Moderate    Rehab Potential Good    PT Frequency 2x / week    PT Duration 6 weeks    PT Treatment/Interventions ADLs/Self Care Home Management;Cryotherapy;Electrical Stimulation;Iontophoresis /ml Dexamethasone;Moist Heat;Ultrasound;Gait training;Stair training;Functional mobility training;Therapeutic activities;Therapeutic exercise;Balance training;Neuromuscular re-education;Patient/family education;Manual techniques;Scar mobilization;Passive range of motion;Dry needling;Energy conservation;Taping;Vasopneumatic Device    PT Next Visit Plan Establish initial HEP for LE strengthening & balance    Consulted and Agree with Plan of Care Patient           Patient will benefit from skilled therapeutic intervention in order to improve the following deficits and impairments:  Cardiopulmonary status limiting activity,Decreased activity tolerance,Decreased mobility,Decreased range of motion,Decreased scar mobility,Decreased strength,Hypomobility,Increased fascial restricitons,Increased muscle spasms,Impaired perceived  functional ability,Impaired flexibility,Impaired UE functional use,Improper body mechanics,Postural dysfunction,Pain,Decreased balance,Decreased endurance,Difficulty walking,Decreased safety awareness  Visit Diagnosis: Repeated falls  Unsteadiness on feet  Muscle weakness (generalized)  Other symptoms and signs involving the musculoskeletal system  Chronic bilateral low back pain with bilateral sciatica     Problem List Patient Active Problem List   Diagnosis Date Noted  . Normocytic anemia 10/15/2019  . Dysphagia 09/17/2019  . Costochondritis 06/07/2019  . Seasonal allergic rhinitis due to pollen 11/03/2018  . Arthropathy of cervical facet joint 11/01/2018  . Neural foraminal stenosis of cervical spine 11/01/2018  . Atypical chest pain 10/24/2018  . Adjustment disorder with mixed anxiety and depressed mood 09/05/2018  . Insomnia 03/03/2018  . S/P CABG x 4 01/10/2018  . Hyperlipidemia 01/09/2018  . Unstable angina (HCC) 01/05/2018  . CAD (coronary artery disease), native coronary artery 12/28/2017  .  Asymptomatic microscopic hematuria 11/14/2017  . Iron deficiency anemia 09/05/2017  . Cervicalgia 12/18/2015  . Benign essential HTN 04/08/2015  . Metatarsalgia of right foot 02/11/2015    Marry Guan, PT, MPT 10/21/2020, 5:05 PM  [facility] [address]  [address] [place], [place], [address] Phone: [phone]   Fax:  [phone]  Name: SELITA STAIGER MRN: [id number] Date of Birth: [birth year]

## 2020-10-30 ENCOUNTER — Encounter: Payer: Self-pay | Admitting: Hematology and Oncology

## 2020-11-04 ENCOUNTER — Other Ambulatory Visit: Payer: Self-pay

## 2020-11-04 ENCOUNTER — Ambulatory Visit: Payer: Medicare Other | Attending: Physician Assistant | Admitting: Physical Therapy

## 2020-11-04 DIAGNOSIS — M5442 Lumbago with sciatica, left side: Secondary | ICD-10-CM | POA: Insufficient documentation

## 2020-11-04 DIAGNOSIS — M6281 Muscle weakness (generalized): Secondary | ICD-10-CM | POA: Insufficient documentation

## 2020-11-04 DIAGNOSIS — R296 Repeated falls: Secondary | ICD-10-CM

## 2020-11-04 DIAGNOSIS — R2681 Unsteadiness on feet: Secondary | ICD-10-CM | POA: Diagnosis present

## 2020-11-04 DIAGNOSIS — R29898 Other symptoms and signs involving the musculoskeletal system: Secondary | ICD-10-CM

## 2020-11-04 DIAGNOSIS — M5441 Lumbago with sciatica, right side: Secondary | ICD-10-CM | POA: Diagnosis present

## 2020-11-04 DIAGNOSIS — G8929 Other chronic pain: Secondary | ICD-10-CM | POA: Diagnosis present

## 2020-11-04 NOTE — Therapy (Signed)
[facility] [address]  [address] [place], [place], [address] Phone: [phone]   Fax:  [phone]  Physical Therapy Treatment  Patient Details  Name: Diana [name] MRN: [id number] Date of Birth: 12-14-1955 Referring Provider (PT): Diana [name], [place]   Encounter Date: 11/04/2020   PT End of Session - 11/04/20 1527    Visit Number 2    Number of Visits 16    Date for PT Re-Evaluation 12/16/20    Authorization Type UHC Medicare & Medicaid [place]    PT Start Time 1527    PT Stop Time 1619    PT Time Calculation (min) 52 min    Activity Tolerance Patient tolerated treatment well    Behavior During Therapy [facility] for tasks assessed/performed           Past Medical History:  Diagnosis Date  . Allergy   . Arthritis    feet  . Bronchitis 10/30/2016  . Carpal tunnel syndrome   . Coronary artery disease   . GERD (gastroesophageal reflux disease)   . Hyperlipidemia   . Hypertension   . Non-ST elevation (NSTEMI) myocardial infarction (HCC) 10/30/2016  . NSTEMI (non-ST elevated myocardial infarction) (HCC) 10/30/2016    Past Surgical History:  Procedure Laterality Date  . CARPAL TUNNEL RELEASE Left 02/12/2016   Procedure: LEFT CARPAL TUNNEL RELEASE;  Surgeon: Cindee Salt, MD;  Location: [facility];  Service: Orthopedics;  Laterality: Left;  FAB  . CORONARY ARTERY BYPASS GRAFT N/A 01/10/2018   Procedure: CORONARY ARTERY BYPASS GRAFTING (CABG) TIMES 4, USING LEFT INTERNAL MAMMARY ARTERY TO OM1  AND ENDOSCOPICALLY HARVESTED RIGHT SAPHENOUS VEIN TO DIAGONAL, AND SEQUENTIALLY TO OM2 AND DISTAL CIRCUMFLEX;  Surgeon: Delight Ovens, MD;  Location: [facility] OR;  Service: Open Heart Surgery;  Laterality: N/A;  . LEFT HEART CATH AND CORONARY ANGIOGRAPHY N/A 01/05/2018   Procedure: LEFT HEART CATH AND CORONARY ANGIOGRAPHY;  Surgeon: Lennette Bihari, MD;  Location: [facility];  Service: Cardiovascular;  Laterality: N/A;  . TEE  WITHOUT CARDIOVERSION N/A 01/10/2018   Procedure: TRANSESOPHAGEAL ECHOCARDIOGRAM (TEE);  Surgeon: Delight Ovens, MD;  Location: [facility] OR;  Service: Open Heart Surgery;  Laterality: N/A;  . TONSILLECTOMY    . TUBAL LIGATION    . WRIST SURGERY Right     There were no vitals filed for this visit.   Subjective Assessment - 11/04/20 1531    Subjective Pt reports she was started on a new medication that starts with an "h" but cannot remember the name - she will write it down and bring it with her to next visit. She thinks the new med makes her perspire more and causes her muscles to cramp.    Pertinent History CABG x 4 - 01/10/18    Patient Stated Goals "to reduce the muscle tightness and feel steadier on my feet"    Currently in Pain? No/denies                             [facility] Adult PT Treatment/Exercise - 11/04/20 1527      Knee/Hip Exercises: Stretches   Passive Hamstring Stretch Right;Left;2 reps;30 seconds    Passive Hamstring Stretch Limitations supine with strap & seated hip hinge - pt preferring seated stretch    Hip Flexor Stretch Right;Left;2 reps;30 seconds    Hip Flexor Stretch Limitations mod thomas with strap & seated lunge position over edge  of chair - pt unsure of preference    ITB Stretch Right;Left;2 reps;30 seconds    ITB Stretch Limitations lateral lean over counter    Piriformis Stretch Right;Left;1 rep;30 seconds    Piriformis Stretch Limitations side sitting hip hinge    Gastroc Stretch Right;Left;1 rep;30 seconds    Gastroc Stretch Limitations standing runner's stretch at counter                  PT Education - 11/04/20 1618    Education Details Initial HEP instruction - Access Code: [email or web address]    Person(s) Educated Patient    Methods Explanation;Demonstration;Verbal cues;Tactile cues;Handout    Comprehension Verbalized understanding;Verbal cues required;Tactile cues required;Returned demonstration;Need further instruction             PT Short Term Goals - 11/04/20 1537      PT SHORT TERM GOAL #1   Title Patient will be independent with initial HEP    Status On-going    Target Date 11/18/20             PT Long Term Goals - 11/04/20 1537      PT LONG TERM GOAL #1   Title Patient will be independent with ongoing/advanced HEP +/- gym program for self-management at home    Status On-going    Target Date 12/16/20      PT LONG TERM GOAL #2   Title Patient will demonstrate improved B LE strength to >/= 4/5 for improved stability and ease of mobility    Status On-going    Target Date 12/16/20      PT LONG TERM GOAL #3   Title Patient will decrease 5x STS time to </= 11.4 sec per age-appropriate norms    Status On-going    Target Date 12/16/20      PT LONG TERM GOAL #4   Title Patient will improve FGA score to >/= 25/30 to improve gait stability and reduce risk for falls    Status On-going    Target Date 12/16/20      PT LONG TERM GOAL #5   Title Patient will report no further instances of falls    Status On-going    Target Date 12/16/20                 Plan - 11/04/20 1538    Clinical Impression Statement [name] reports her cardiologist started her on a new medication that makes her cramp more easily as well as perspire more heavily - discussed appropriate hydration to minimize risk for cramping as well as provided instruction in stretches to relieve tight muscles and reduce muscle cramps. Pt noting preference for seated or standing options for most stretches. Introduced ankle and hip strengthening targeting areas of greatest weakness identified on eval - some shakiness evident during strengthening exercises but otherwise well tolerated. HEP provided with preferred versions of stretches and strengthening exercises.    Comorbidities CAD s/p NSTEMI 10/30/16 & CABG x 4 01/10/18; HTN; cervicalgia; carpal tunnel syndrome s/p CTR 02/12/16; GERD; bronchitis    Rehab Potential Good    PT Frequency 2x / week     PT Duration 6 weeks    PT Treatment/Interventions ADLs/Self Care Home Management;Cryotherapy;Electrical Stimulation;Iontophoresis 4mg /ml Dexamethasone;Moist Heat;Ultrasound;Gait training;Stair training;Functional mobility training;Therapeutic activities;Therapeutic exercise;Balance training;Neuromuscular re-education;Patient/family education;Manual techniques;Scar mobilization;Passive range of motion;Dry needling;Energy conservation;Taping;Vasopneumatic Device    PT Next Visit Plan Review initial HEP; progress LE strengthening as tolerated; introduce balance activities    PT Home Exercise Plan Access Code: [email or web address] (  6/7)    Consulted and Agree with Plan of Care Patient           Patient will benefit from skilled therapeutic intervention in order to improve the following deficits and impairments:  Cardiopulmonary status limiting activity,Decreased activity tolerance,Decreased mobility,Decreased range of motion,Decreased scar mobility,Decreased strength,Hypomobility,Increased fascial restricitons,Increased muscle spasms,Impaired perceived functional ability,Impaired flexibility,Impaired UE functional use,Improper body mechanics,Postural dysfunction,Pain,Decreased balance,Decreased endurance,Difficulty walking,Decreased safety awareness  Visit Diagnosis: Repeated falls  Unsteadiness on feet  Muscle weakness (generalized)  Other symptoms and signs involving the musculoskeletal system  Chronic bilateral low back pain with bilateral sciatica     Problem List Patient Active Problem List   Diagnosis Date Noted  . Normocytic anemia 10/15/2019  . Dysphagia 09/17/2019  . Costochondritis 06/07/2019  . Seasonal allergic rhinitis due to pollen 11/03/2018  . Arthropathy of cervical facet joint 11/01/2018  . Neural foraminal stenosis of cervical spine 11/01/2018  . Atypical chest pain 10/24/2018  . Adjustment disorder with mixed anxiety and depressed mood 09/05/2018  . Insomnia 03/03/2018  .  S/P CABG x 4 01/10/2018  . Hyperlipidemia 01/09/2018  . Unstable angina (HCC) 01/05/2018  . CAD (coronary artery disease), native coronary artery 12/28/2017  . Asymptomatic microscopic hematuria 11/14/2017  . Iron deficiency anemia 09/05/2017  . Cervicalgia 12/18/2015  . Benign essential HTN 04/08/2015  . Metatarsalgia of right foot 02/11/2015    Marry Guan, PT, MPT 11/04/2020, 7:20 PM  [facility] [address]  [address] [place], [place], [address] Phone: [phone]   Fax:  [phone]  Name: Diana [name] MRN: [id number] Date of Birth: [birth year]

## 2020-11-04 NOTE — Addendum Note (Signed)
Addended by: Marry Guan on: 11/04/2020 07:19 PM   Modules accepted: Orders

## 2020-11-04 NOTE — Patient Instructions (Signed)
        Access Code: [email or web address] URL: [email or web address] Date: 11/04/2020 Prepared by: Glenetta Hew  Exercises Seated Hamstring Stretch - 2-3 x daily - 7 x weekly - 3 reps - 30 sec hold Seated Hip Flexor Stretch - 2-3 x daily - 7 x weekly - 3 reps - 30 sec hold Supine Quadriceps Stretch with Strap on Table - 2-3 x daily - 7 x weekly - 3 reps - 30 sec hold Seated Table Piriformis Stretch - 2-3 x daily - 7 x weekly - 3 reps - 30 sec hold Standing Gastroc Stretch at Counter - 2-3 x daily - 7 x weekly - 3 reps - 30 sec hold Standing ITB Stretch - 2-3 x daily - 7 x weekly - 3 reps - 30 sec hold Seated Hip Adduction Squeeze with Ball - 1 x daily - 7 x weekly - 2 sets - 10 reps - 3-5 sec hold Seated Hip Abduction with Resistance - 1 x daily - 7 x weekly - 2 sets - 10 reps - 3-5 sec hold Seated Heel Toe Raises - 1 x daily - 7 x weekly - 2 sets - 10 reps - 3 sec hold

## 2020-11-06 ENCOUNTER — Encounter: Payer: Medicare Other | Admitting: Physical Therapy

## 2020-11-10 ENCOUNTER — Emergency Department (HOSPITAL_BASED_OUTPATIENT_CLINIC_OR_DEPARTMENT_OTHER): Payer: Medicare Other

## 2020-11-10 ENCOUNTER — Encounter: Payer: Self-pay | Admitting: Hematology and Oncology

## 2020-11-10 ENCOUNTER — Emergency Department (HOSPITAL_COMMUNITY): Payer: Medicare Other

## 2020-11-10 ENCOUNTER — Observation Stay (HOSPITAL_BASED_OUTPATIENT_CLINIC_OR_DEPARTMENT_OTHER)
Admission: EM | Admit: 2020-11-10 | Discharge: 2020-11-11 | Disposition: A | Discharge status: Home / Self Care | Payer: Medicare Other | Attending: Family Medicine | Admitting: Family Medicine

## 2020-11-10 ENCOUNTER — Encounter (HOSPITAL_BASED_OUTPATIENT_CLINIC_OR_DEPARTMENT_OTHER): Payer: Self-pay | Admitting: Emergency Medicine

## 2020-11-10 DIAGNOSIS — Z951 Presence of aortocoronary bypass graft: Secondary | ICD-10-CM | POA: Diagnosis not present

## 2020-11-10 DIAGNOSIS — R2681 Unsteadiness on feet: Secondary | ICD-10-CM | POA: Insufficient documentation

## 2020-11-10 DIAGNOSIS — I2510 Atherosclerotic heart disease of native coronary artery without angina pectoris: Secondary | ICD-10-CM | POA: Insufficient documentation

## 2020-11-10 DIAGNOSIS — I6782 Cerebral ischemia: Secondary | ICD-10-CM | POA: Diagnosis not present

## 2020-11-10 DIAGNOSIS — R2689 Other abnormalities of gait and mobility: Secondary | ICD-10-CM | POA: Insufficient documentation

## 2020-11-10 DIAGNOSIS — D509 Iron deficiency anemia, unspecified: Secondary | ICD-10-CM | POA: Diagnosis present

## 2020-11-10 DIAGNOSIS — Z7982 Long term (current) use of aspirin: Secondary | ICD-10-CM | POA: Insufficient documentation

## 2020-11-10 DIAGNOSIS — I639 Cerebral infarction, unspecified: Secondary | ICD-10-CM

## 2020-11-10 DIAGNOSIS — I10 Essential (primary) hypertension: Secondary | ICD-10-CM | POA: Insufficient documentation

## 2020-11-10 DIAGNOSIS — Z79899 Other long term (current) drug therapy: Secondary | ICD-10-CM | POA: Insufficient documentation

## 2020-11-10 DIAGNOSIS — I679 Cerebrovascular disease, unspecified: Principal | ICD-10-CM | POA: Insufficient documentation

## 2020-11-10 DIAGNOSIS — M6281 Muscle weakness (generalized): Secondary | ICD-10-CM | POA: Diagnosis not present

## 2020-11-10 DIAGNOSIS — Z20822 Contact with and (suspected) exposure to covid-19: Secondary | ICD-10-CM | POA: Insufficient documentation

## 2020-11-10 DIAGNOSIS — R531 Weakness: Secondary | ICD-10-CM | POA: Diagnosis present

## 2020-11-10 LAB — CBC WITH DIFFERENTIAL/PLATELET
Abs Immature Granulocytes: 0.02 10*3/uL (ref 0.00–0.07)
Basophils Absolute: 0 10*3/uL (ref 0.0–0.1)
Basophils Relative: 0 %
Eosinophils Absolute: 0 10*3/uL (ref 0.0–0.5)
Eosinophils Relative: 1 %
HCT: 28.6 % — ABNORMAL LOW (ref 36.0–46.0)
Hemoglobin: 9.3 g/dL — ABNORMAL LOW (ref 12.0–15.0)
Immature Granulocytes: 1 %
Lymphocytes Relative: 37 %
Lymphs Abs: 1.5 10*3/uL (ref 0.7–4.0)
MCH: 25.6 pg — ABNORMAL LOW (ref 26.0–34.0)
MCHC: 32.5 g/dL (ref 30.0–36.0)
MCV: 78.8 fL — ABNORMAL LOW (ref 80.0–100.0)
Monocytes Absolute: 0.7 10*3/uL (ref 0.1–1.0)
Monocytes Relative: 16 %
Neutro Abs: 1.9 10*3/uL (ref 1.7–7.7)
Neutrophils Relative %: 45 %
Platelets: 162 10*3/uL (ref 150–400)
RBC: 3.63 MIL/uL — ABNORMAL LOW (ref 3.87–5.11)
RDW: 15.2 % (ref 11.5–15.5)
WBC: 4.1 10*3/uL (ref 4.0–10.5)
nRBC: 0 % (ref 0.0–0.2)

## 2020-11-10 LAB — MAGNESIUM: Magnesium: 1.7 mg/dL (ref 1.7–2.4)

## 2020-11-10 LAB — COMPREHENSIVE METABOLIC PANEL
ALT: 16 U/L (ref 0–44)
AST: 23 U/L (ref 15–41)
Albumin: 4.2 g/dL (ref 3.5–5.0)
Alkaline Phosphatase: 45 U/L (ref 38–126)
Anion gap: 10 (ref 5–15)
BUN: 25 mg/dL — ABNORMAL HIGH (ref 8–23)
CO2: 23 mmol/L (ref 22–32)
Calcium: 9.4 mg/dL (ref 8.9–10.3)
Chloride: 105 mmol/L (ref 98–111)
Creatinine, Ser: 1.01 mg/dL — ABNORMAL HIGH (ref 0.44–1.00)
GFR, Estimated: >60 mL/min (ref >60)
Glucose, Bld: 84 mg/dL (ref 70–99)
Potassium: 3.7 mmol/L (ref 3.5–5.1)
Sodium: 138 mmol/L (ref 135–145)
Total Bilirubin: 0.4 mg/dL (ref 0.3–1.2)
Total Protein: 7.5 g/dL (ref 6.5–8.1)

## 2020-11-10 LAB — RESP PANEL BY RT-PCR (FLU A&B, COVID) ARPGX2
Influenza A by PCR: NEGATIVE
Influenza B by PCR: NEGATIVE
SARS Coronavirus 2 by RT PCR: NEGATIVE

## 2020-11-10 LAB — ETHANOL: Alcohol, Ethyl (B): <10 mg/dL (ref <10)

## 2020-11-10 LAB — CBG MONITORING, ED: Glucose-Capillary: 85 mg/dL (ref 70–99)

## 2020-11-10 IMAGING — DX DG CHEST 2V
2 series · 2 of 2 positions shown · non-contrast
Comparison: [DATE]

CLINICAL DATA: Weakness and unsteady gait.

EXAM:
CHEST - 2 VIEW

[chest lat]
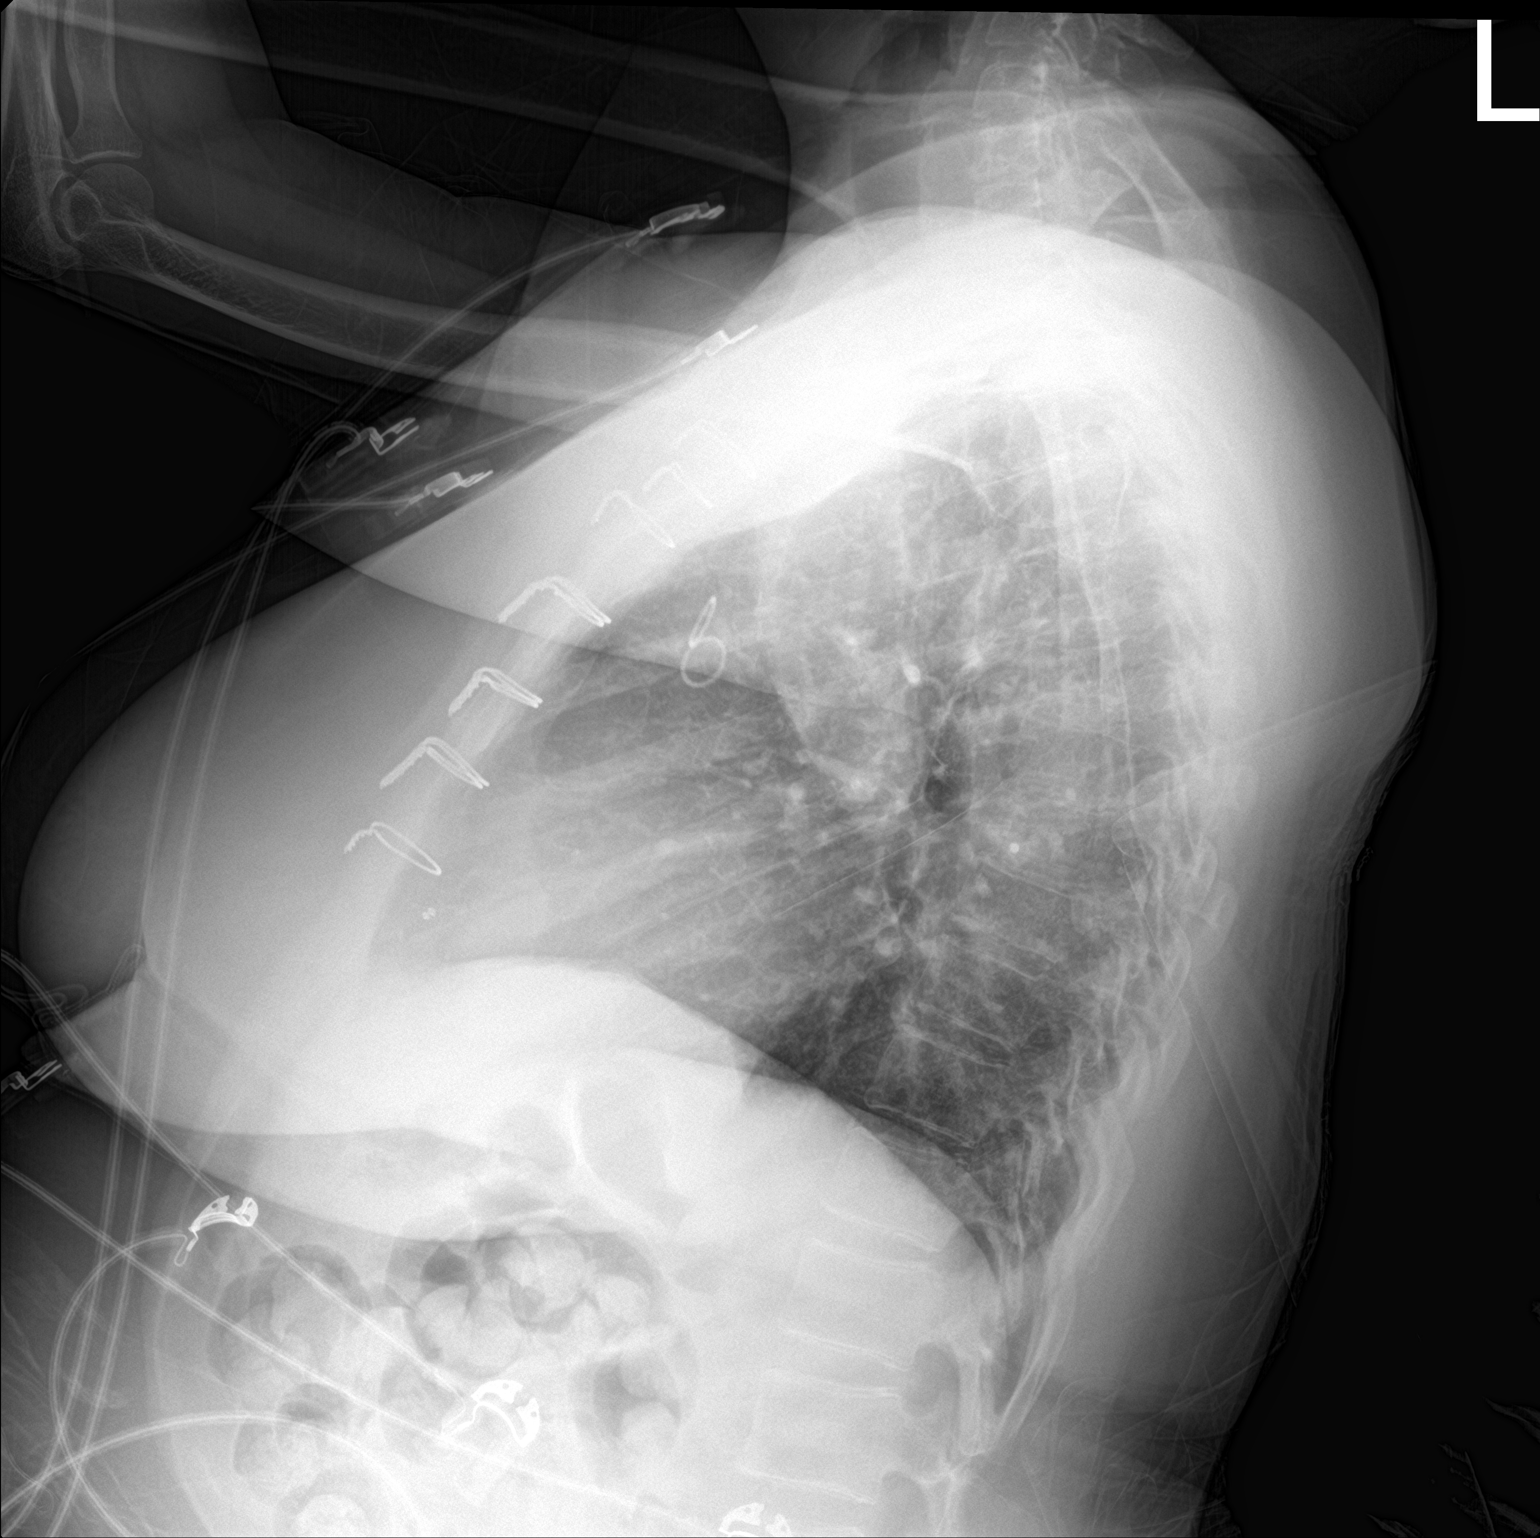

[chest ap strecther]
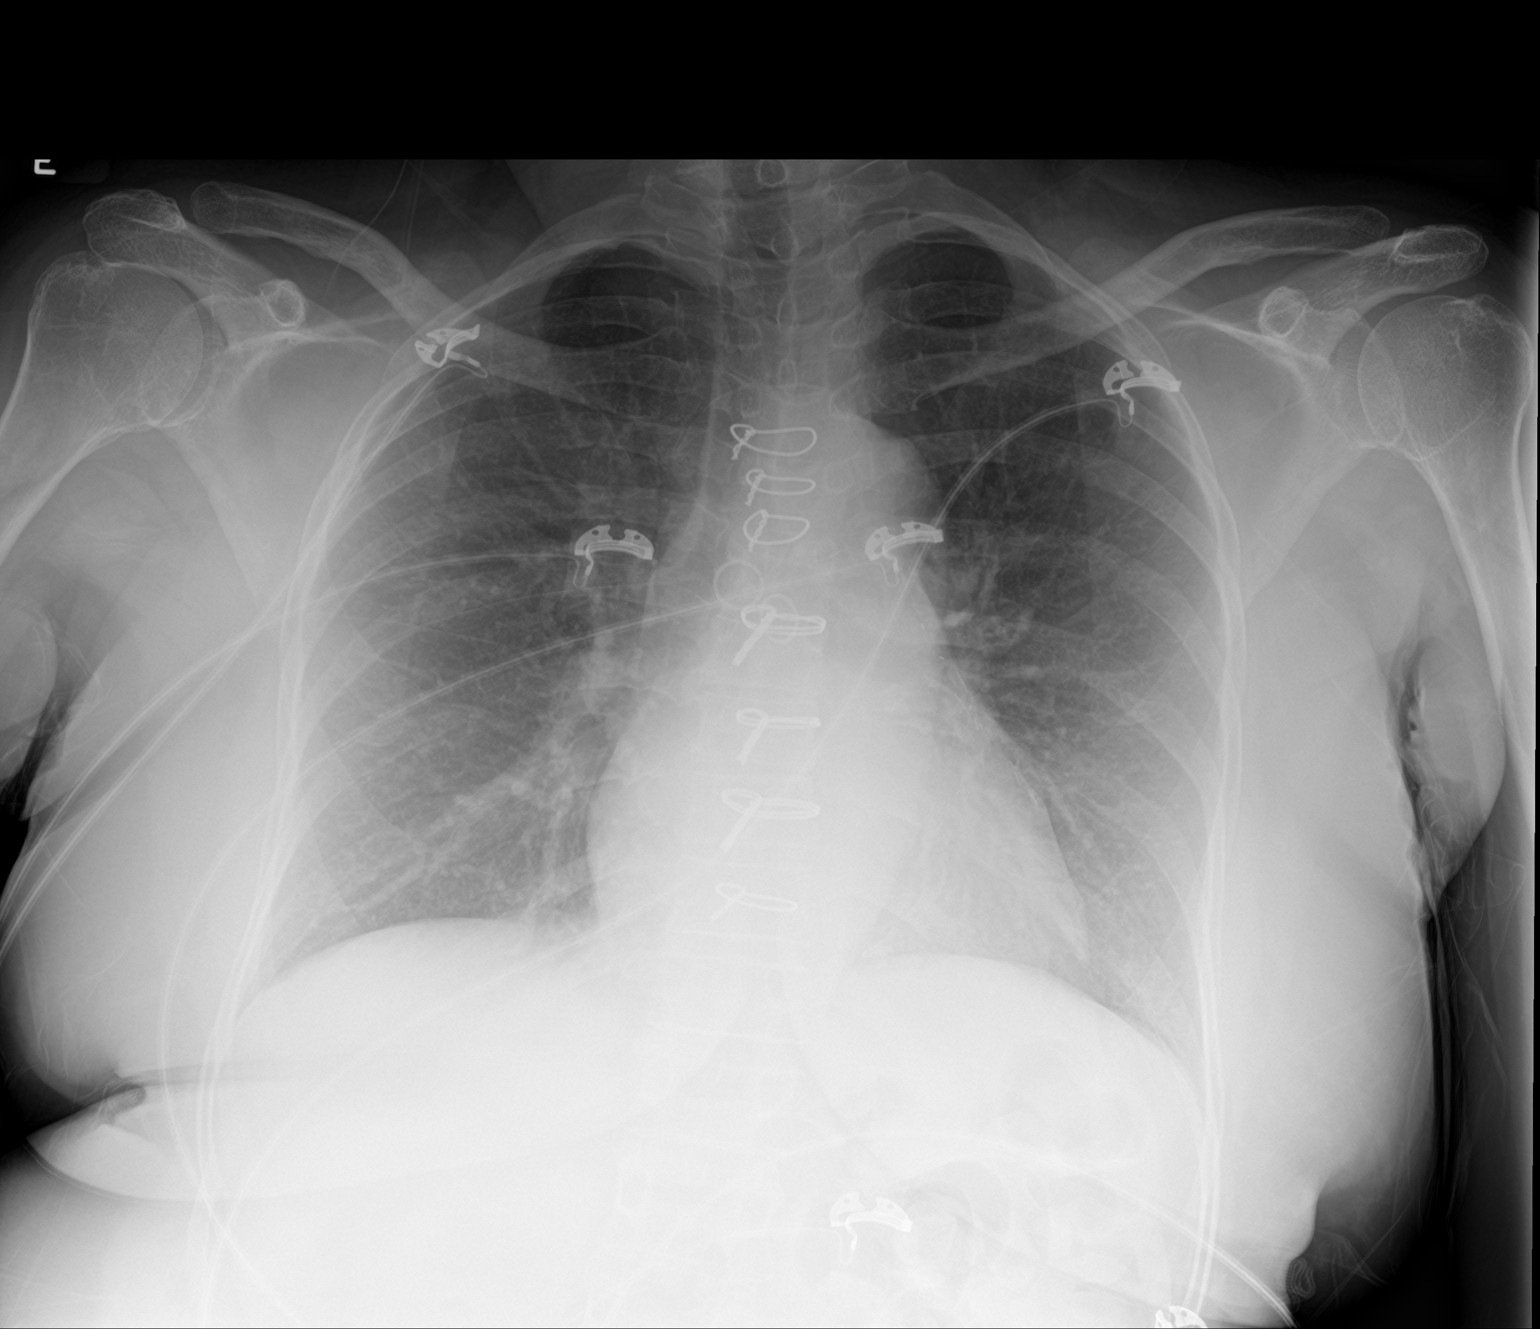

[2 of 2 positions shown; findings below may reference images not displayed]

FINDINGS: Postoperative changes in the mediastinum. Heart size and pulmonary
vascularity are normal. Lungs are clear. No pleural effusions. No
pneumothorax. Mediastinal contours appear intact.
IMPRESSION: No active cardiopulmonary disease.

## 2020-11-10 IMAGING — CT CT HEAD W/O CM
3 series · 15 of 47 positions shown, 18 images · non-contrast
Comparison: [DATE]

CLINICAL DATA: Generalized weakness for 1 week. Shaky feeling with
unsteady gait. Headache. Mental status change.

EXAM:
CT HEAD WITHOUT CONTRAST
TECHNIQUE: Contiguous axial images were obtained from the base of the skull
through the vertex without intravenous contrast.

[Series 2: head wo · axial · 0.48mm/px · z∈[+658,+804]mm · 9 of 35 slices shown, 12 images]
[im 3/35  brain]
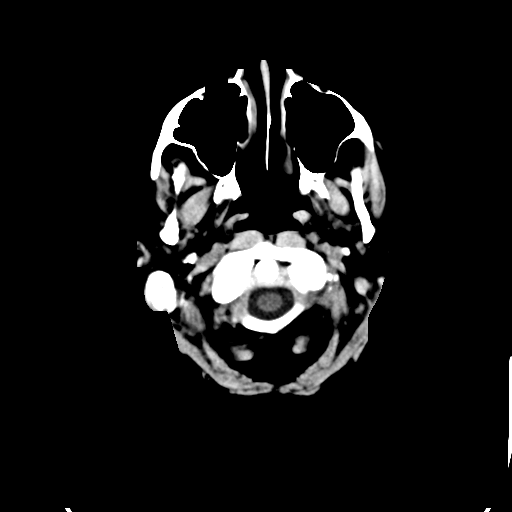
[im 3/35  bone]
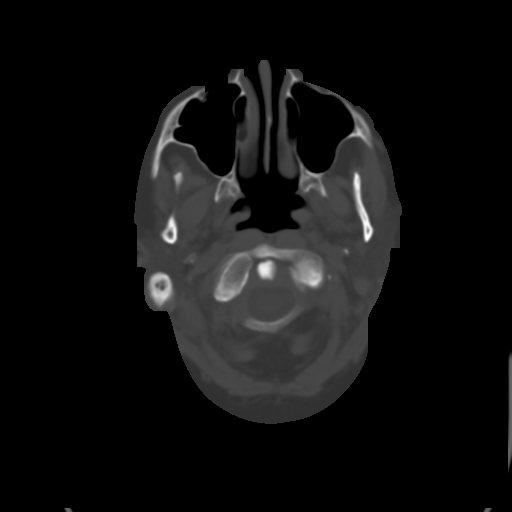
[im 6/35  brain]
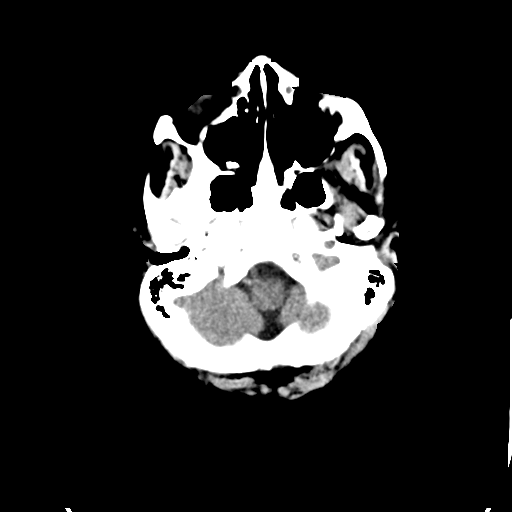
[im 10/35  brain]
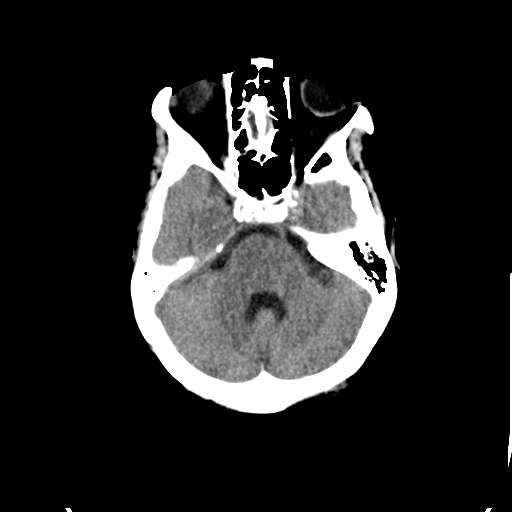
[im 13/35  brain]
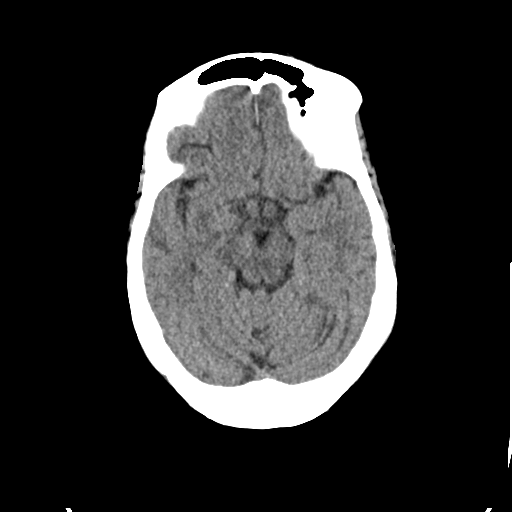
[im 18/35  brain]
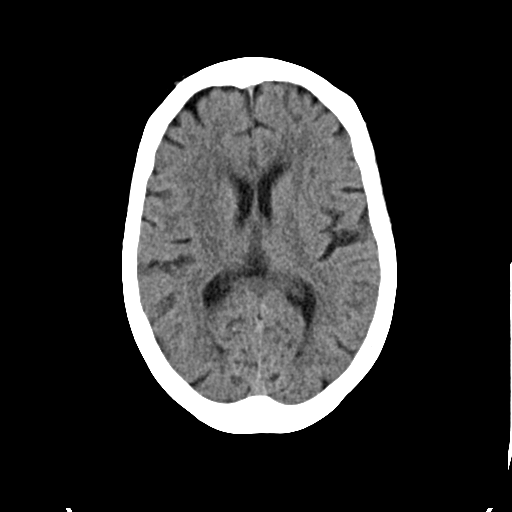
[im 18/35  bone]
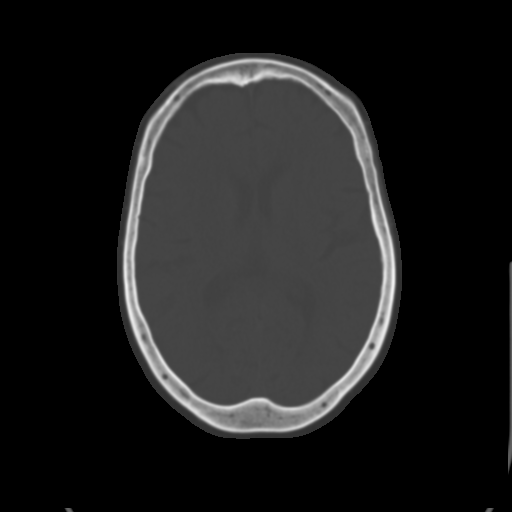
[im 22/35  brain]
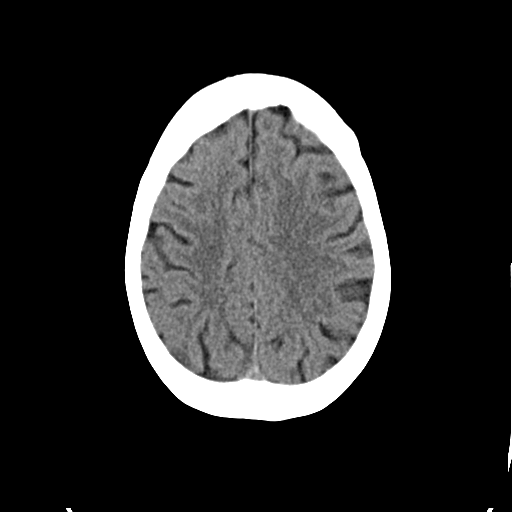
[im 25/35  brain]
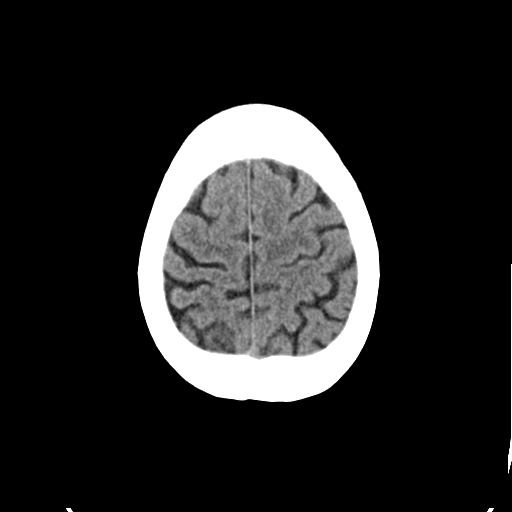
[im 29/35  brain]
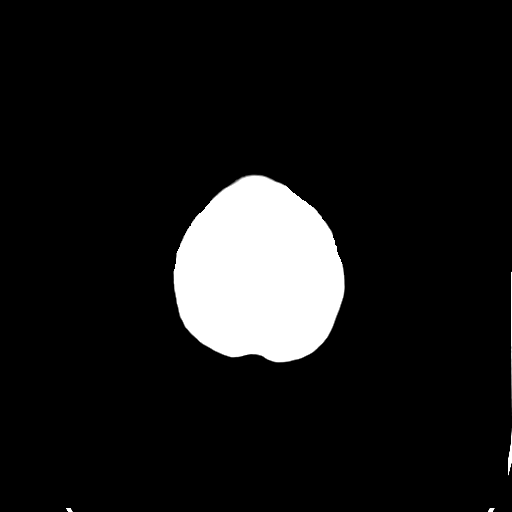
[im 32/35  brain]
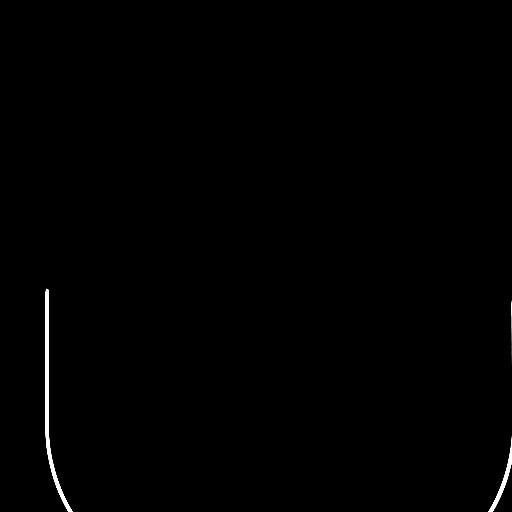
[im 32/35  bone]
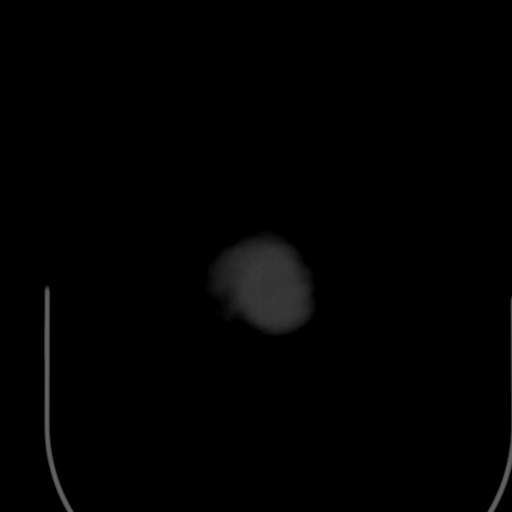

[Series 4: coronal soft · coronal · 0.33mm/px · 3 of 76 slices shown]
[im 26/76  brain]
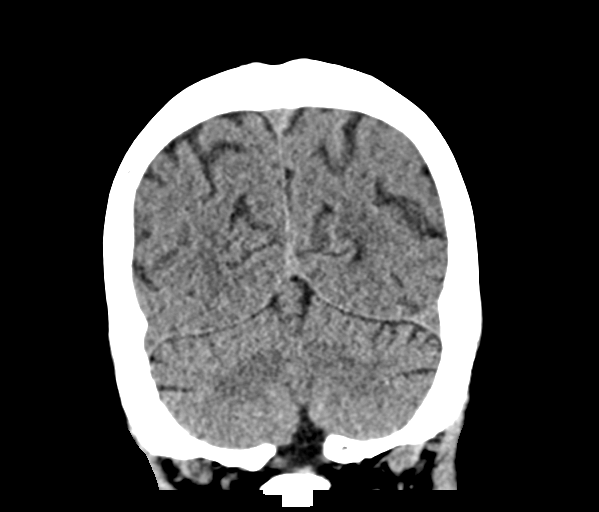
[im 34/76  brain]
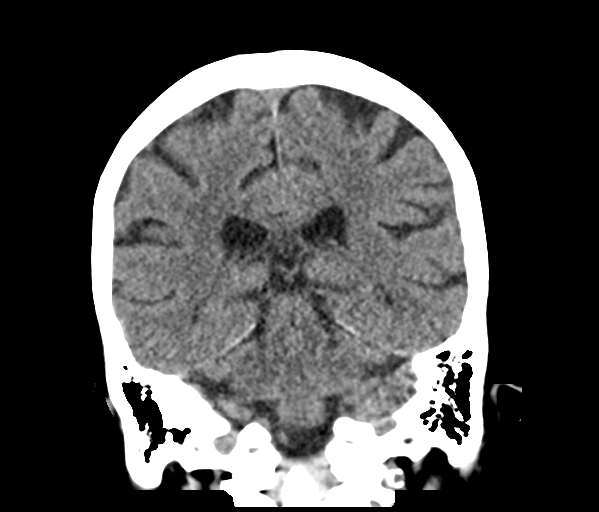
[im 42/76  brain]
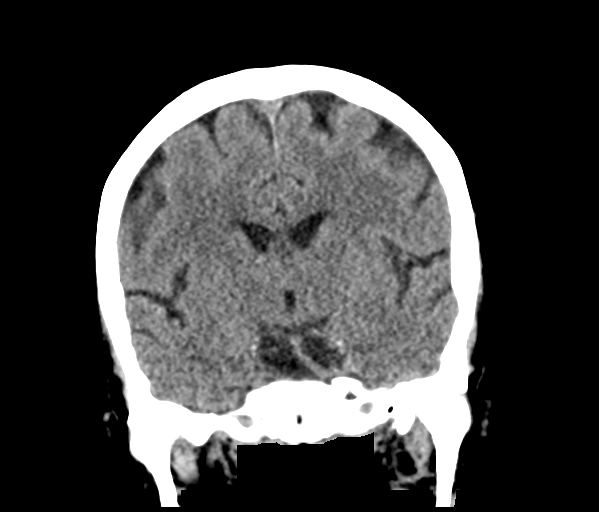

[Series 5: sag soft · sagittal · 0.33mm/px · 3 of 67 slices shown]
[im 23/67  brain]
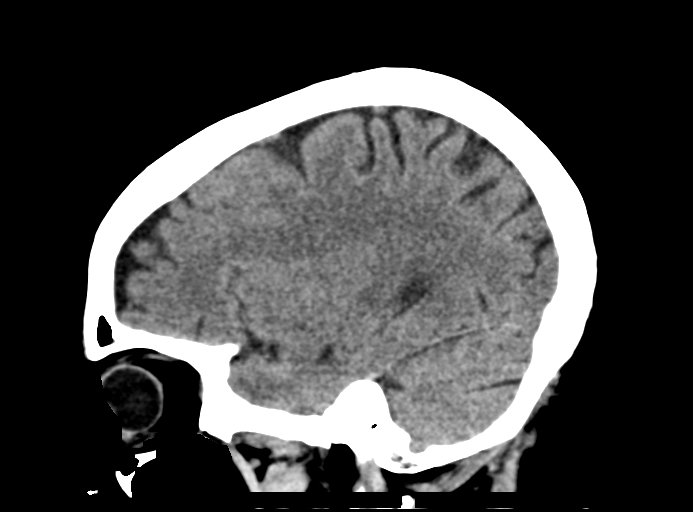
[im 34/67  brain]
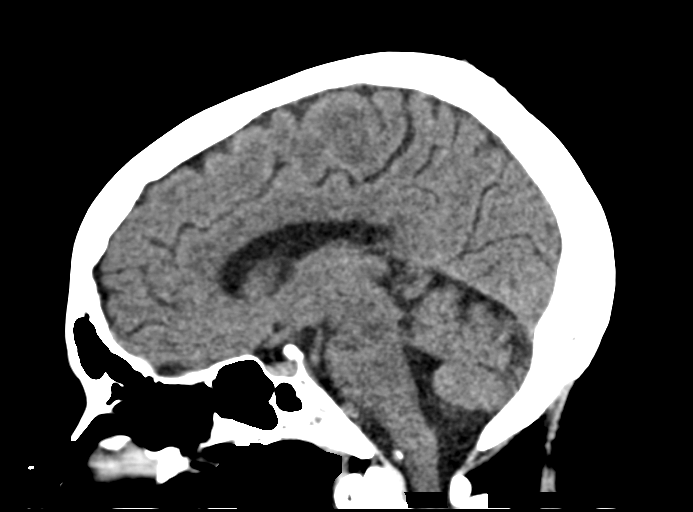
[im 45/67  brain]
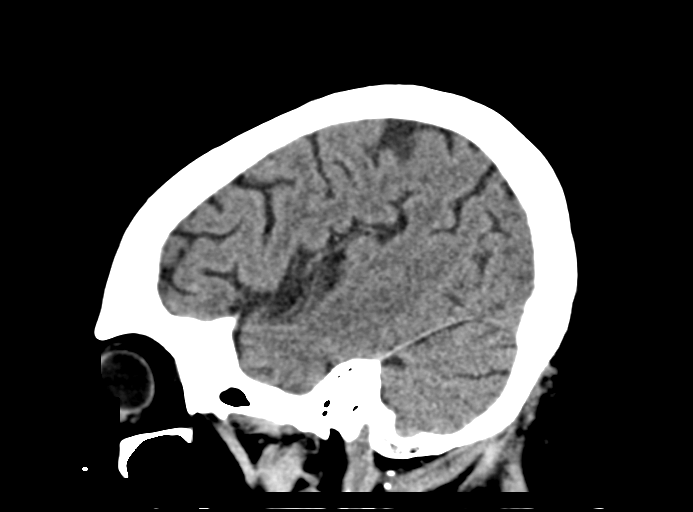

[15 of 47 positions shown; findings below may reference images not displayed]

FINDINGS: Brain: No evidence of acute infarction, hemorrhage, hydrocephalus,
extra-axial collection or mass lesion/mass effect. Mild diffuse
cerebral atrophy. Patchy low-attenuation changes in the deep white
matter consistent with small vessel ischemic changes.

Vascular: Moderate intracranial arterial vascular calcifications.

Skull: Calvarium appears intact.

Sinuses/Orbits: Paranasal sinuses and mastoid air cells are clear.

Other: None.
IMPRESSION: No acute intracranial abnormalities. Mild chronic atrophy and small
vessel ischemic changes.

## 2020-11-10 IMAGING — MR MR CERVICAL SPINE WO/W CM
6 of 8 series · 30 of 48 positions shown · IV contrast (gadavist)
Comparison: None.

CLINICAL DATA: Weakness and tremors

EXAM:
MRI CERVICAL SPINE WITHOUT AND WITH CONTRAST
TECHNIQUE: Multiplanar and multiecho pulse sequences of the cervical spine, to
include the craniocervical junction and cervicothoracic junction,
were obtained without and with intravenous contrast.
CONTRAST:  7mL GADAVIST GADOBUTROL 1 MMOL/ML IV SOLN

[Series 9: T2 · sagittal · 3.0mm · 0.69mm/px · 3 of 15 slices shown (1 of 2)]
[im 1/15]
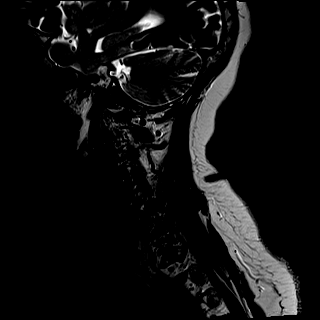
[im 8/15]
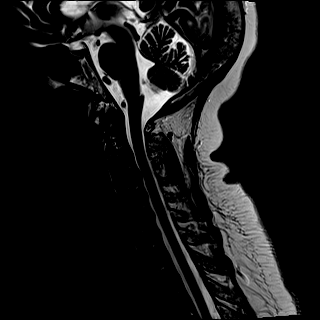
[im 15/15]
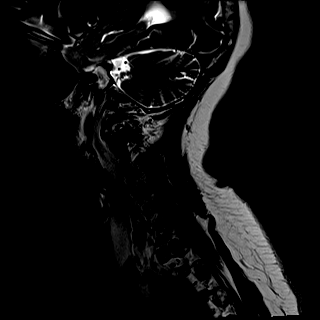

[Series 10: T1 · sagittal · 3.0mm · 0.69mm/px · 3 of 15 slices shown (1 of 2)]
[im 1/15]
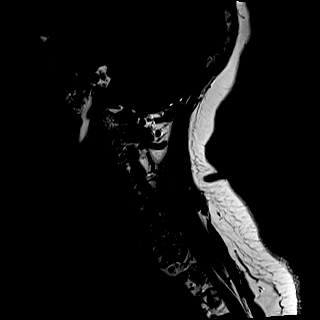
[im 8/15]
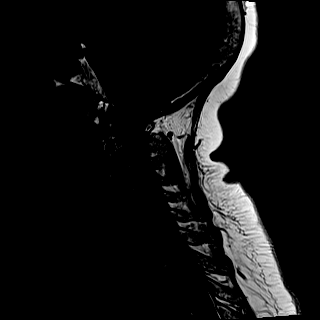
[im 15/15]
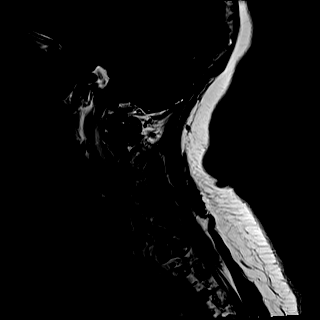

[Series 11: STIR · sagittal · 3.0mm · 0.86mm/px · 3 of 15 slices shown]
[im 1/15]
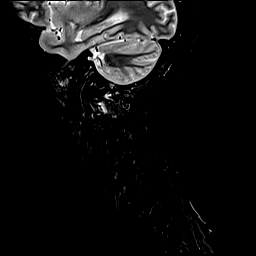
[im 8/15]
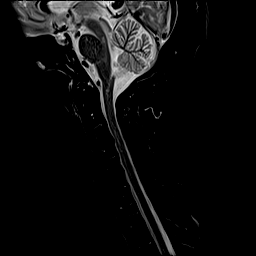
[im 15/15]
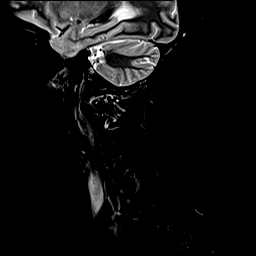

[Series 12: T2 · axial · 3.0mm · 0.66mm/px · z∈[-268,-147]mm · 9 of 40 slices shown (2 of 2)]
[im 1/40]
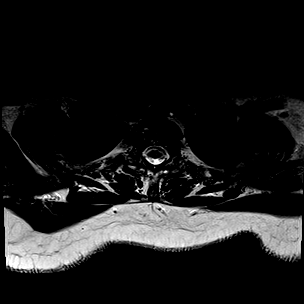
[im 5/40]
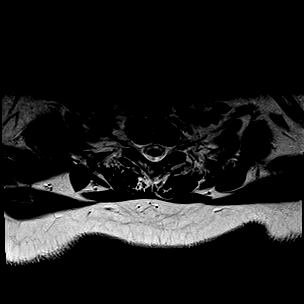
[im 10/40]
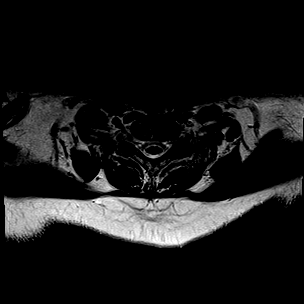
[im 15/40]
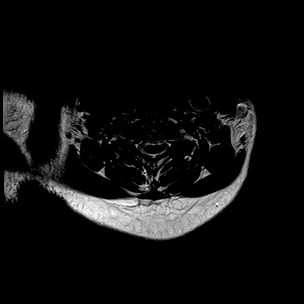
[im 20/40]
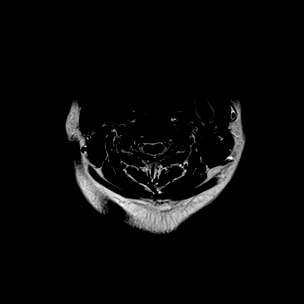
[im 25/40]
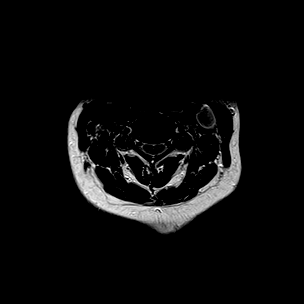
[im 30/40]
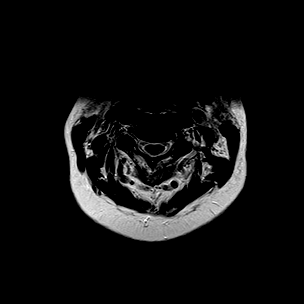
[im 35/40]
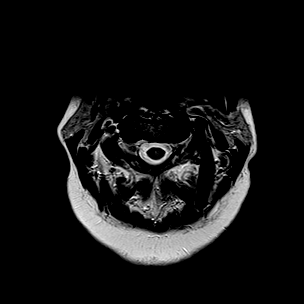
[im 40/40]
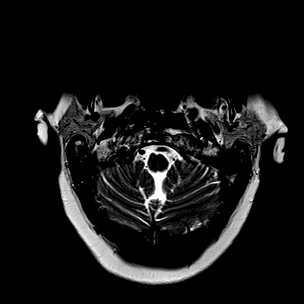

[Series 14: T1 · axial · 3.0mm · 0.39mm/px · z∈[-268,-147]mm · 9 of 40 slices shown (2 of 2)]
[im 1/40]
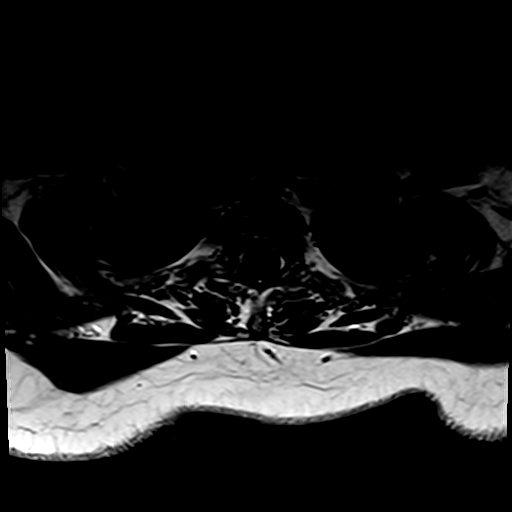
[im 5/40]
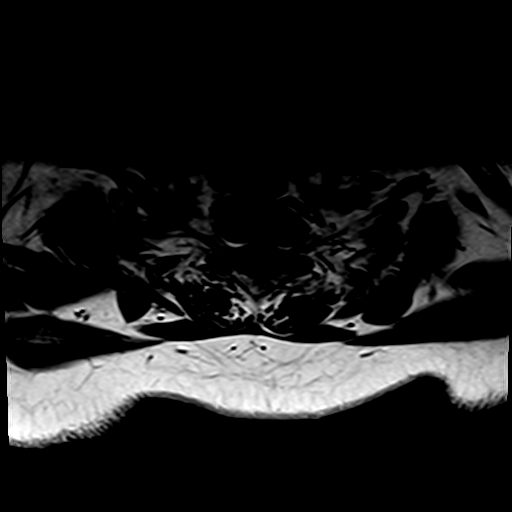
[im 10/40]
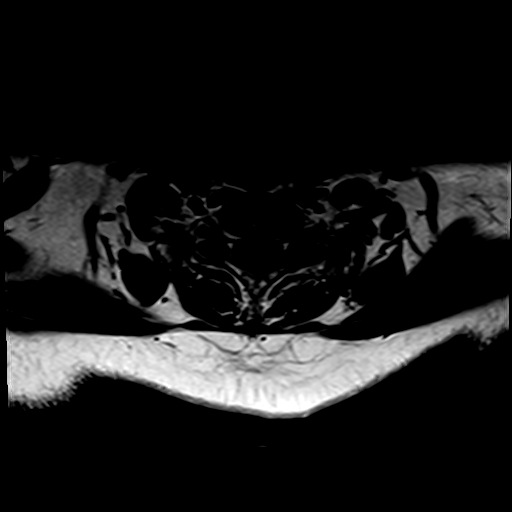
[im 15/40]
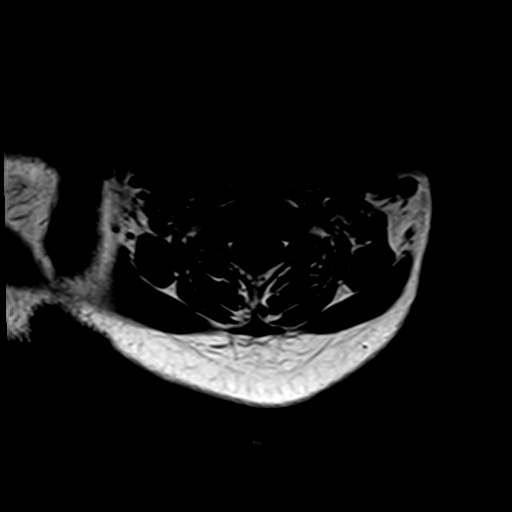
[im 20/40]
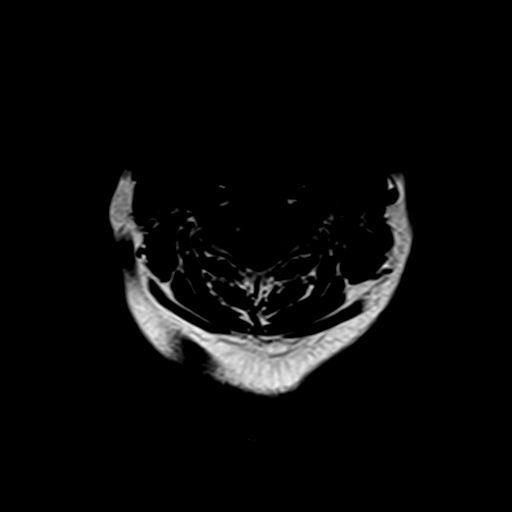
[im 25/40]
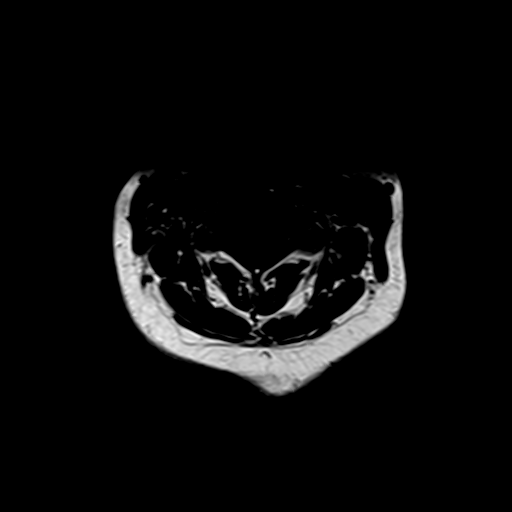
[im 30/40]
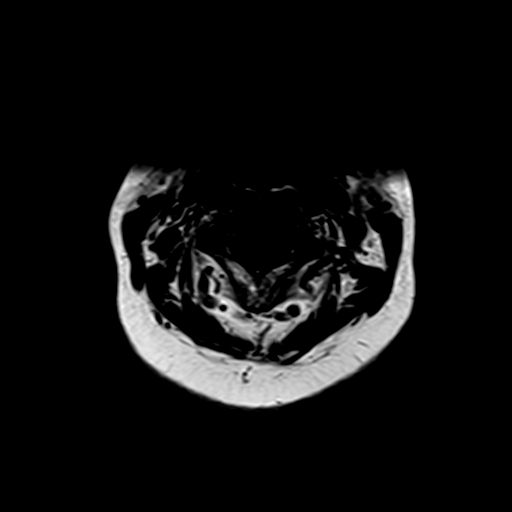
[im 35/40]
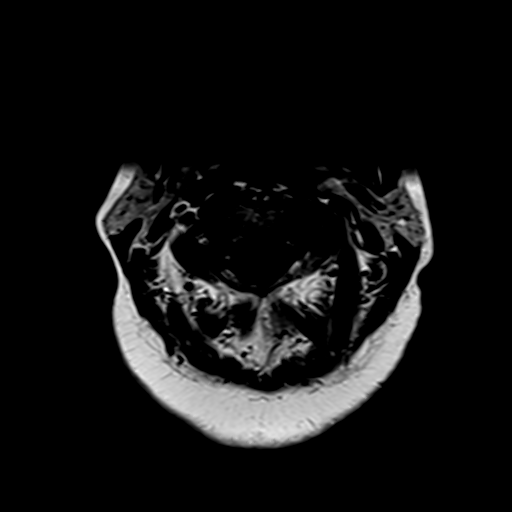
[im 40/40]
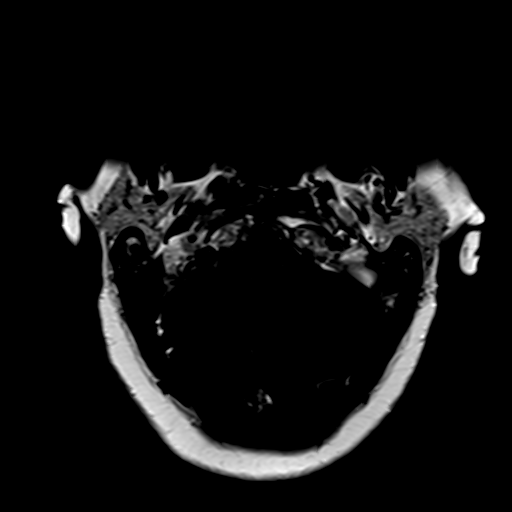

[Series 15: T1 fat-sat post-contrast · sagittal · 3.0mm · 0.43mm/px · 3 of 15 slices shown]
[im 1/15]
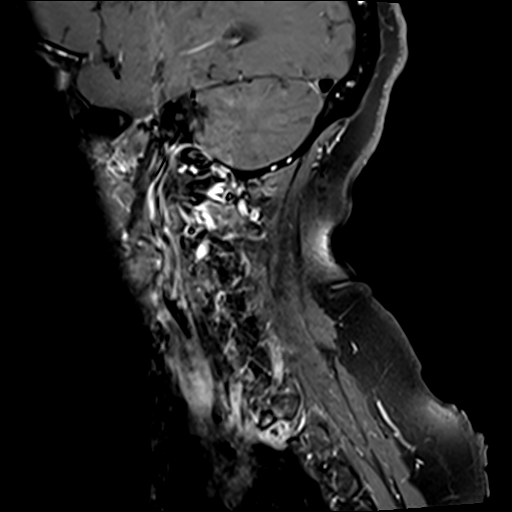
[im 8/15]
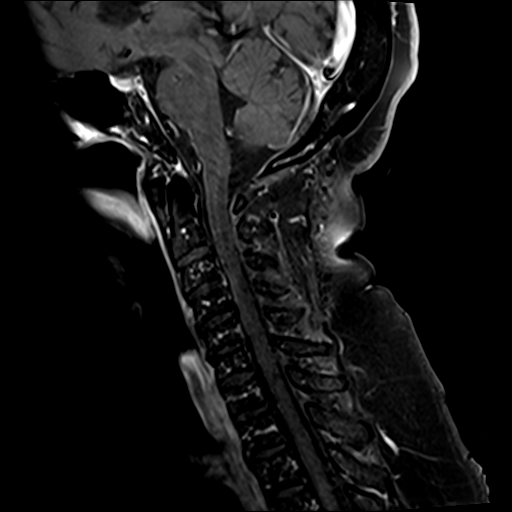
[im 15/15]
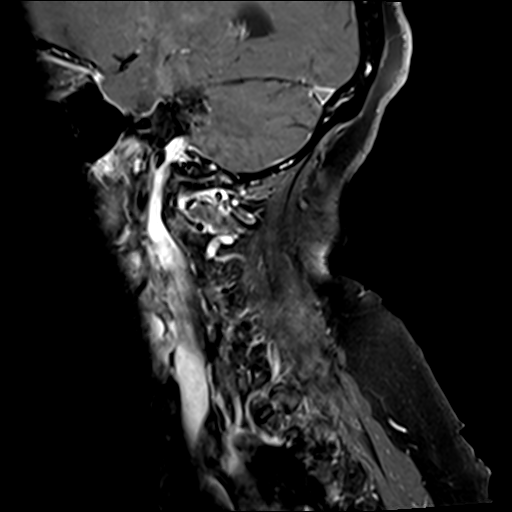

[30 of 48 positions shown; findings below may reference images not displayed]

FINDINGS: Alignment: Normal

Vertebrae: Mild nonspecific heterogeneity of the bone marrow signal.
No focal abnormality.

Cord: Normal

Posterior Fossa, vertebral arteries, paraspinal tissues: Negative

Disc levels:

C1-C2: Normal.

C2-C3: Normal disc space and facets. No spinal canal or
neuroforaminal stenosis.

C3-C4: Small disc bulge. Mild spinal canal narrowing. No foraminal
stenosis.

C4-C5: Small disc bulge with uncovertebral spurring. Mild bilateral
foraminal stenosis. No spinal canal stenosis.

C5-C6: Small left asymmetric disc bulge. No spinal canal stenosis.
Moderate left foraminal stenosis.

C6-C7: No disc herniation or stenosis.

C7-T1: Normal disc space and facets. No spinal canal or
neuroforaminal stenosis.
IMPRESSION: 1. No acute abnormality of the cervical spine.
2. Mild spinal canal stenosis at C3-C4 secondary to small disc
bulge.
3. Moderate left C5-C6 neural foraminal stenosis.
4. Mild bilateral C4-C5 neural foraminal stenosis.

## 2020-11-10 IMAGING — CT CT CERVICAL SPINE W/O CM
3 series · 14 of 33 positions shown, 17 images · non-contrast
Comparison: X-ray [DATE]

CLINICAL DATA: Neck trauma

EXAM:
CT CERVICAL SPINE WITHOUT CONTRAST
TECHNIQUE: Multidetector CT imaging of the cervical spine was performed without
intravenous contrast. Multiplanar CT image reconstructions were also
generated.

[Series 4: c spine soft · axial · 0.38mm/px · z∈[-312,-192]mm · 6 of 80 slices shown, 8 images]
[im 13/80  soft-tissue]
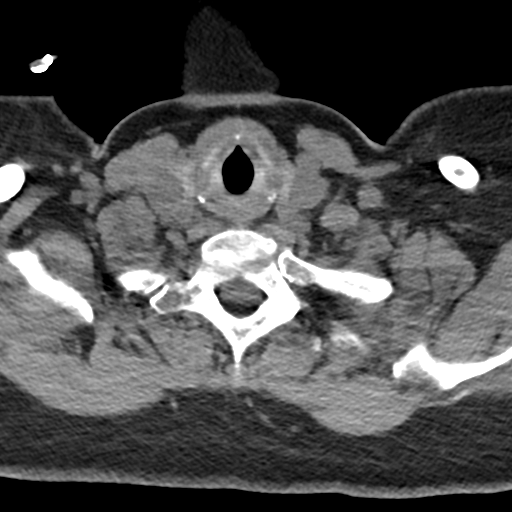
[im 13/80  bone]
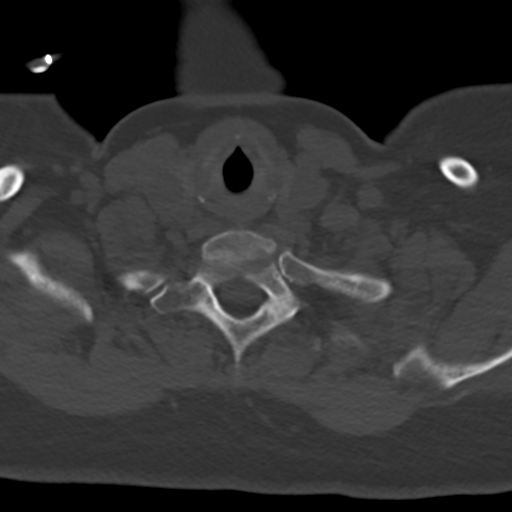
[im 25/80  bone]
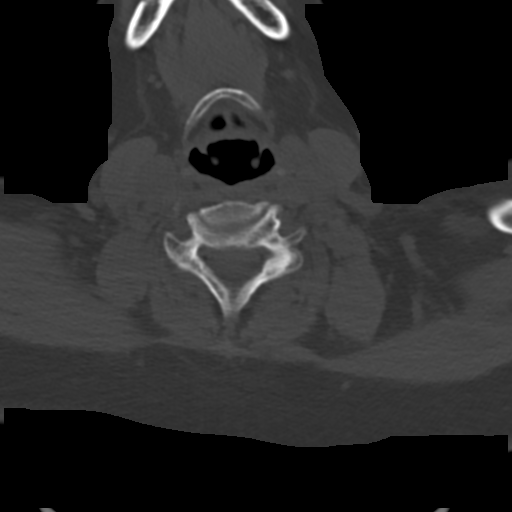
[im 37/80  bone]
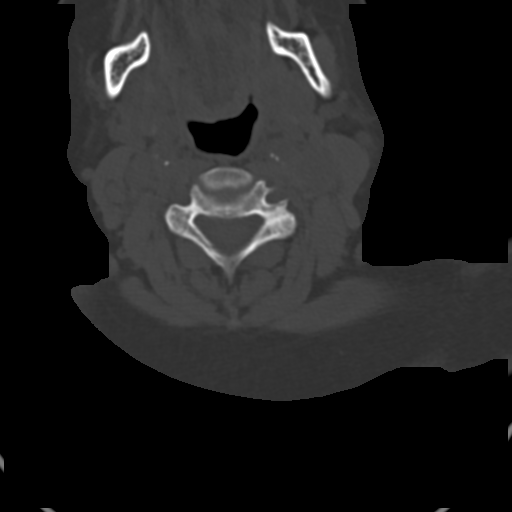
[im 49/80  bone]
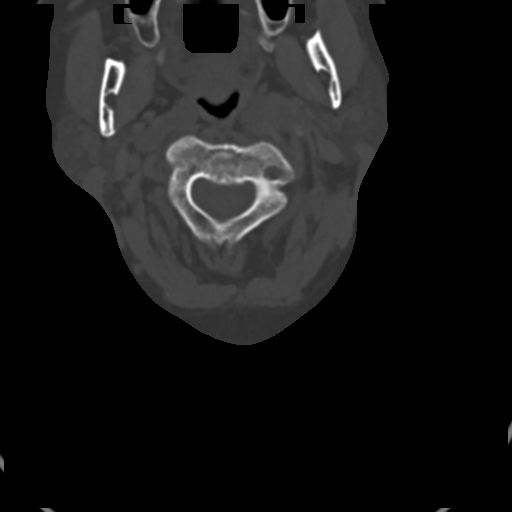
[im 61/80  soft-tissue]
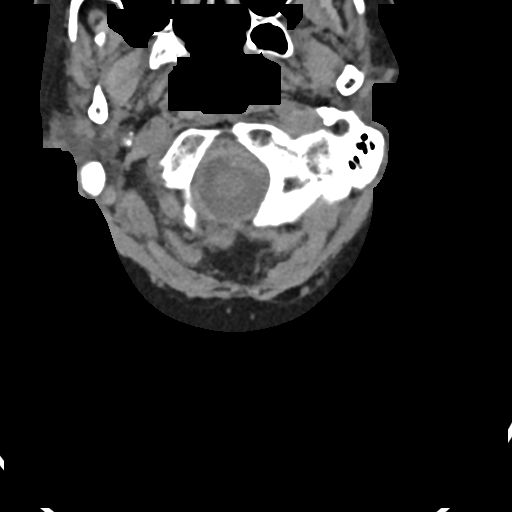
[im 61/80  bone]
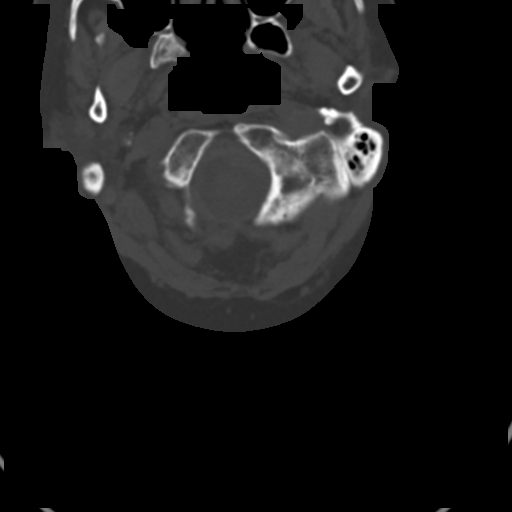
[im 73/80  bone]
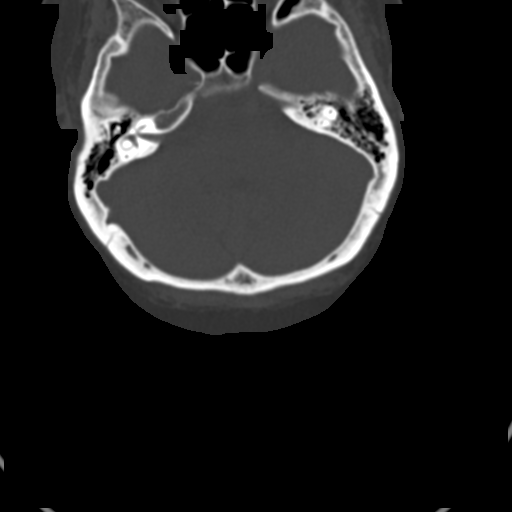

[Series 5: sagittal bone · sagittal · 0.30mm/px · 5 of 61 slices shown, 6 images]
[im 21/61  bone]
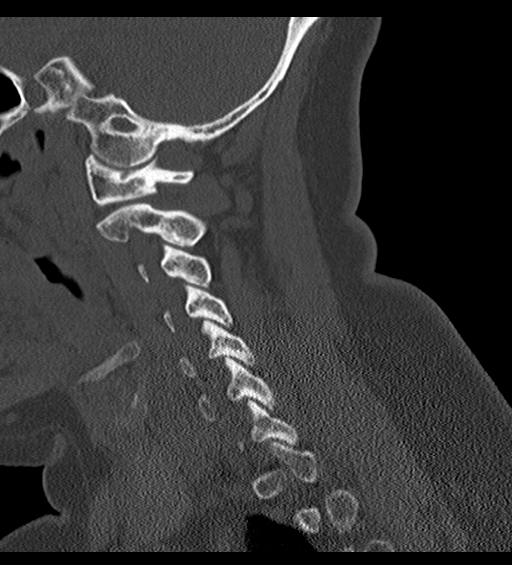
[im 26/61  bone]
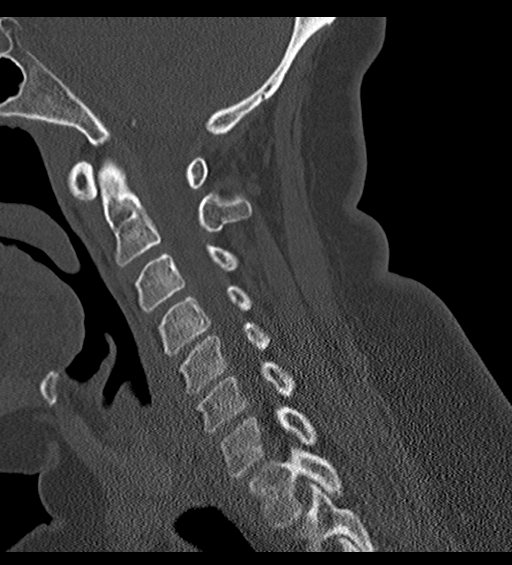
[im 31/61  soft-tissue]
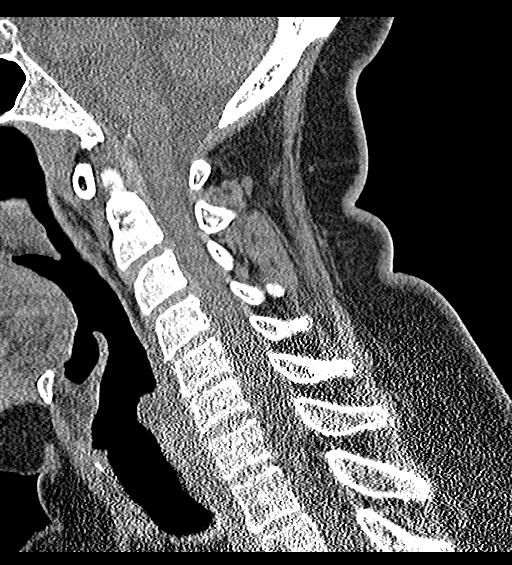
[im 31/61  bone]
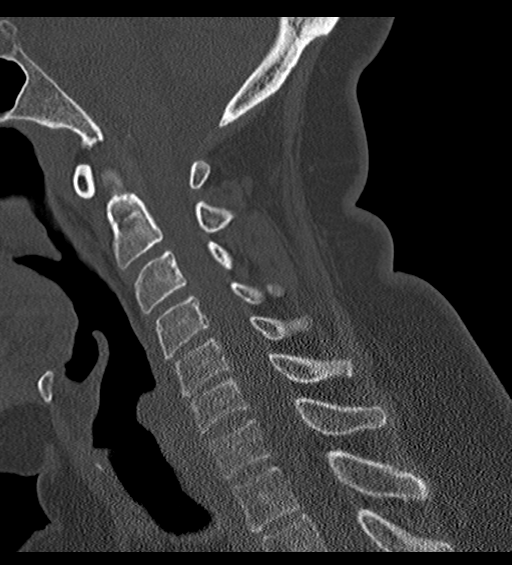
[im 36/61  bone]
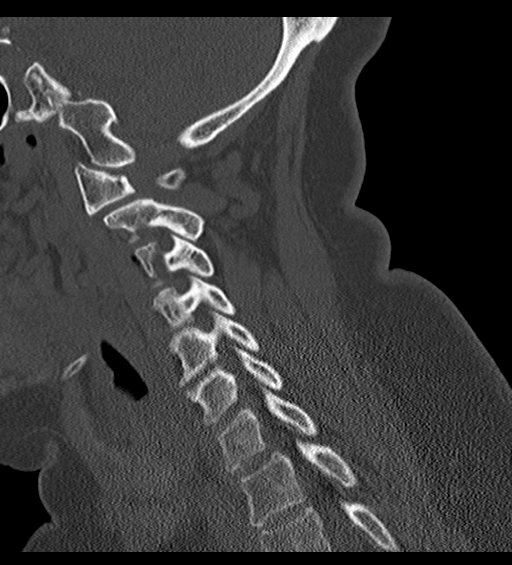
[im 41/61  bone]
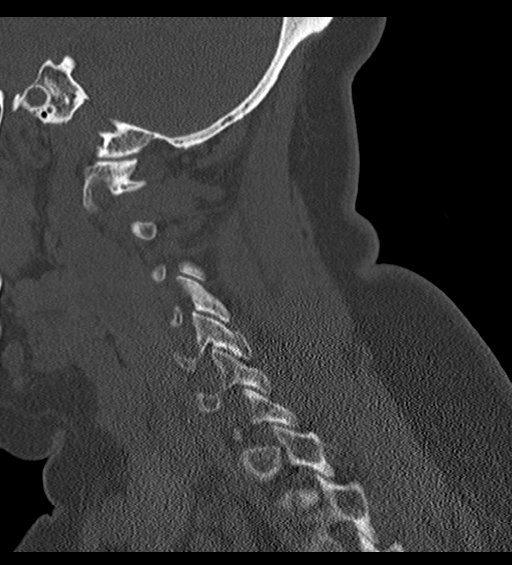

[Series 6: coronal bone · coronal · 0.23mm/px · 3 of 61 slices shown]
[im 13/61  bone]
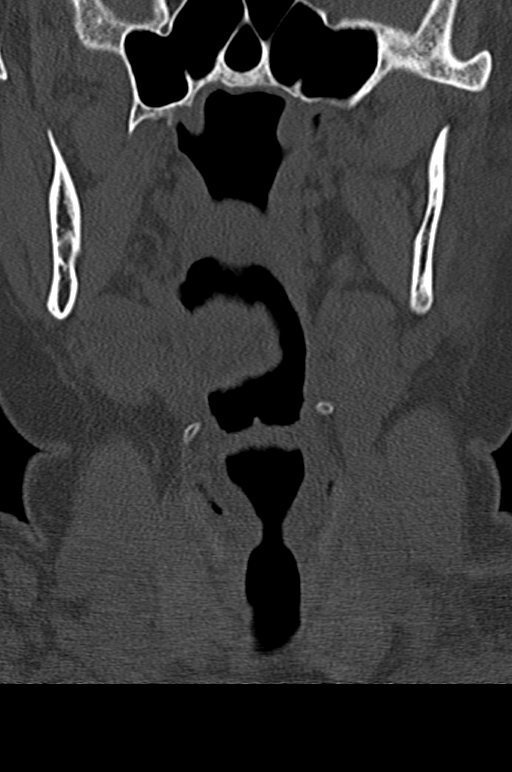
[im 25/61  bone]
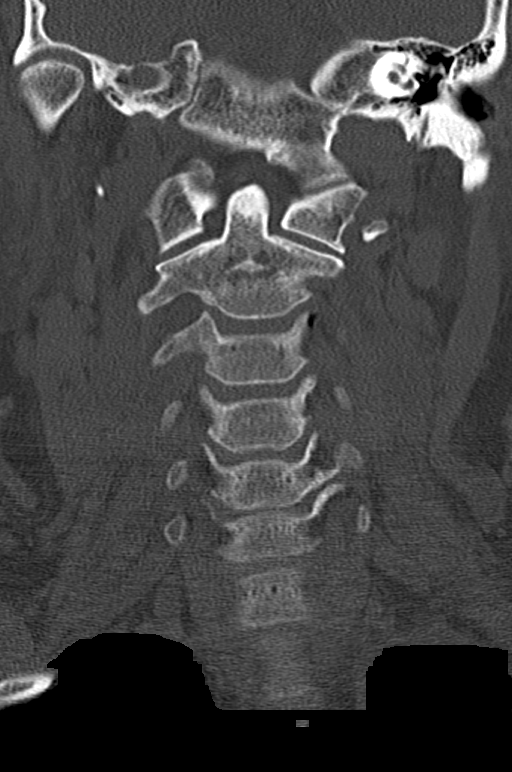
[im 37/61  bone]
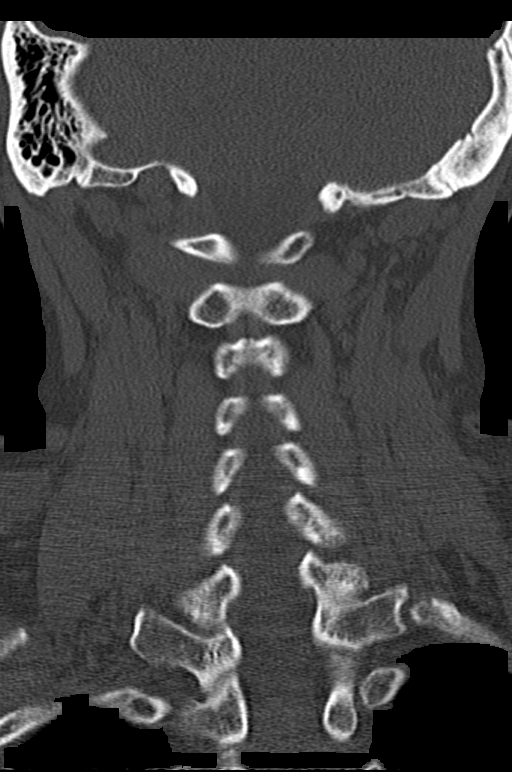

[14 of 33 positions shown; findings below may reference images not displayed]

FINDINGS: Alignment: Facet joints are aligned without dislocation or traumatic
listhesis. Dens and lateral masses are aligned.

Skull base and vertebrae: Minimally displaced fracture involving the
posterior arch of C1 on the left (series 3, image 26). Fracture
margins do not appear sclerotic. No additional fractures. No lytic
or sclerotic bone lesion identified.

Soft tissues and spinal canal: No prevertebral fluid or swelling. No
visible canal hematoma.

Disc levels: Minimal disc height loss at C5-6. No significant facet
arthropathy.

Upper chest: Included lung apices are clear.

Other: Bilateral carotid and vertebral artery atherosclerosis.
IMPRESSION: Acute-appearing minimally displaced fracture involving the posterior
arch of C1 on the left. Recommend cervical spine MRI to evaluate for
any potential ligamentous injury.

These results were called by telephone at the time of interpretation
on [DATE] at [DATE] to provider MIRTA , who verbally
acknowledged these results.

## 2020-11-10 IMAGING — MR MR HEAD WO/W CM
14 of 16 series · 40 of 48 positions shown · IV contrast (gadavist)
Comparison: None.

CLINICAL DATA: Tremors and weakness

EXAM:
MRI HEAD WITHOUT AND WITH CONTRAST
TECHNIQUE: Multiplanar, multiecho pulse sequences of the brain and surrounding
structures were obtained without and with intravenous contrast.
CONTRAST:  7mL GADAVIST GADOBUTROL 1 MMOL/ML IV SOLN

[Series 5: DWI · axial · 3.0mm · 0.88mm/px · z∈[-131,+10]mm · 6 of 96 slices shown (1 of 4)]
[im 1/96]
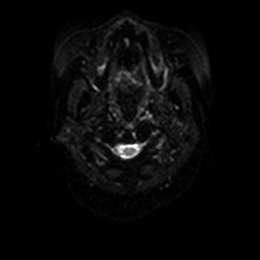
[im 20/96]
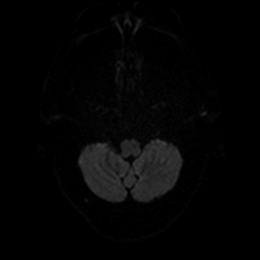
[im 39/96]
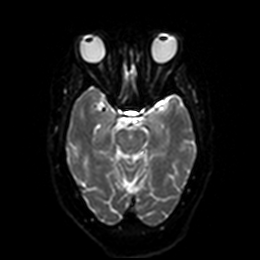
[im 58/96]
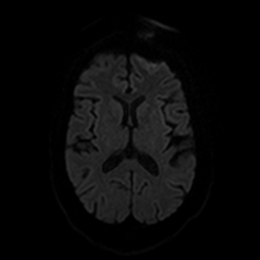
[im 77/96]
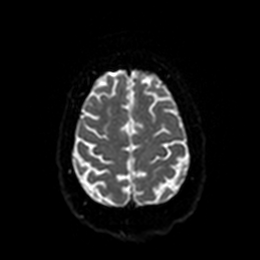
[im 96/96]
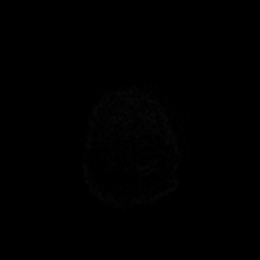

[Series 6: DWI · axial · 3.0mm · 0.88mm/px · z∈[-131,+10]mm · 2 of 48 slices shown (2 of 4)]
[im 1/48]
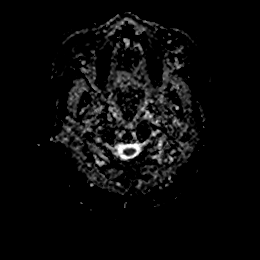
[im 48/48]
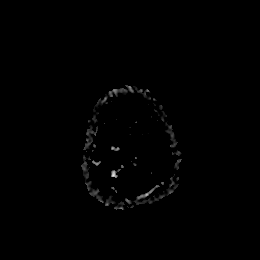

[Series 7: DWI · coronal · 4.0mm · 0.88mm/px · 4 of 68 slices shown (3 of 4)]
[im 1/68]
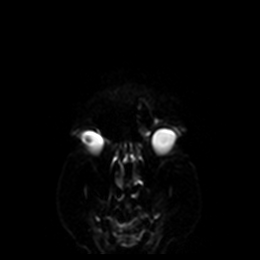
[im 23/68]
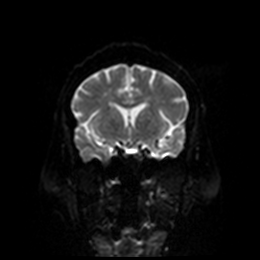
[im 45/68]
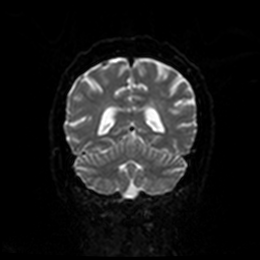
[im 68/68]
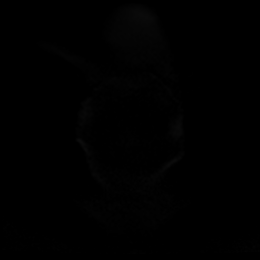

[Series 8: DWI · coronal · 4.0mm · 0.88mm/px · 1 of 34 slices shown (4 of 4)]
[im 1/34]
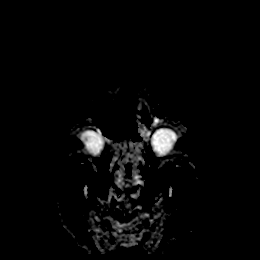

[Series 9: T1 · sagittal · 5.0mm · 0.75mm/px · 2 of 23 slices shown]
[im 1/23]
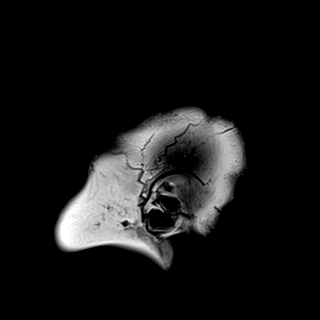
[im 23/23]
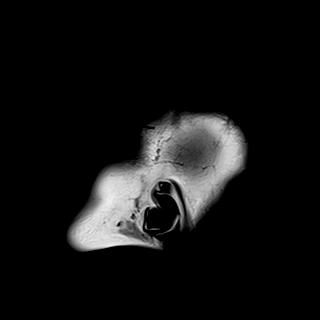

[Series 10: T2 · axial · 5.0mm · 0.72mm/px · z∈[-132,+11]mm · 2 of 25 slices shown]
[im 1/25]
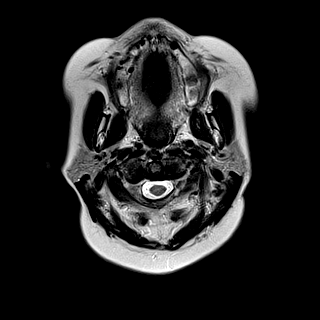
[im 25/25]
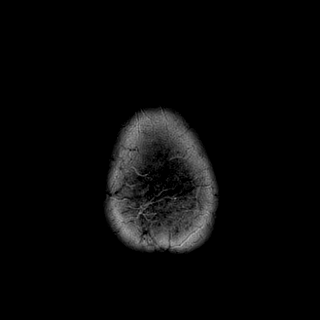

[Series 11: FLAIR · axial · 5.0mm · 0.45mm/px · z∈[-131,+13]mm · 2 of 25 slices shown]
[im 1/25]
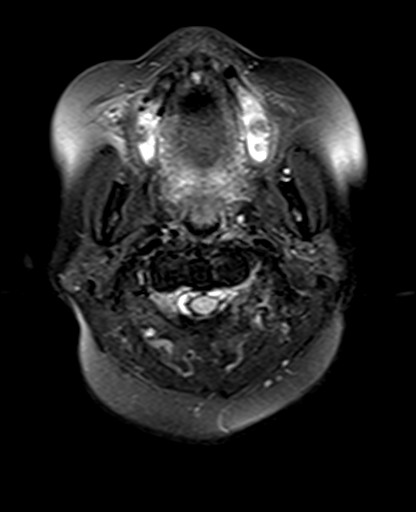
[im 25/25]
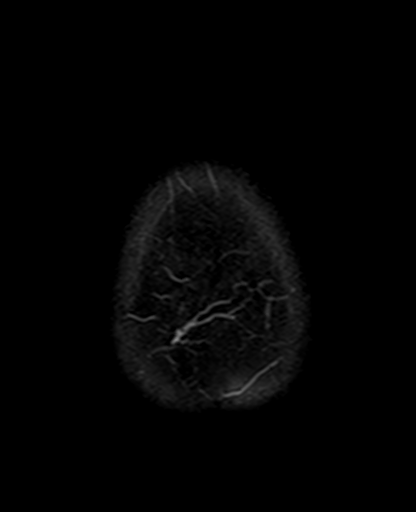

[Series 12: mag_images · axial · 3.0mm · 0.90mm/px · z∈[-135,+18]mm · 4 of 52 slices shown]
[im 1/52]
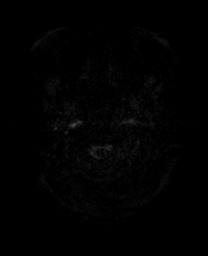
[im 18/52]
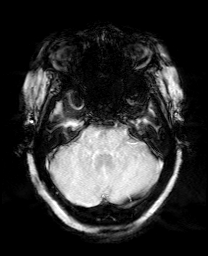
[im 35/52]
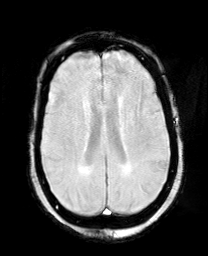
[im 52/52]
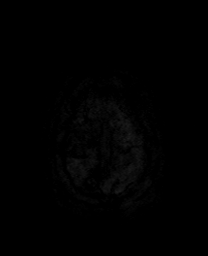

[Series 13: pha_images · axial · 3.0mm · 0.90mm/px · z∈[-135,+18]mm · 4 of 52 slices shown]
[im 1/52]
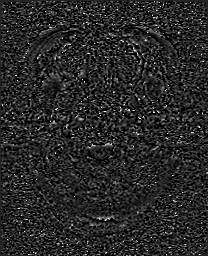
[im 18/52]
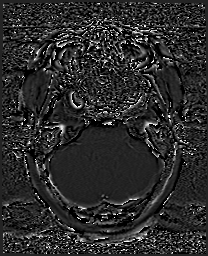
[im 35/52]
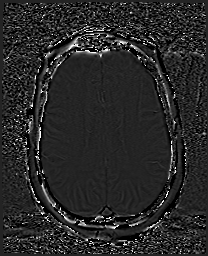
[im 52/52]
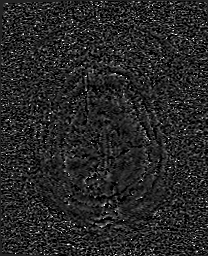

[Series 14: swi_images · axial · 3.0mm · 0.90mm/px · z∈[-135,+18]mm · 4 of 52 slices shown]
[im 1/52]
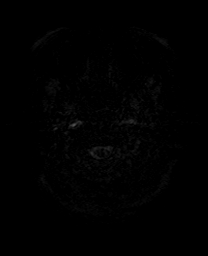
[im 18/52]
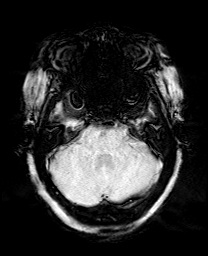
[im 35/52]
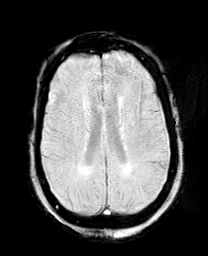
[im 52/52]
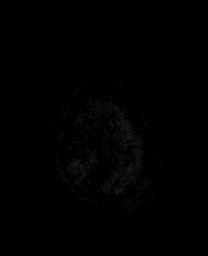

[Series 15: mip_images(sw) · axial · 24.0mm · 0.90mm/px · z∈[-124,+7]mm · 3 of 45 slices shown]
[im 1/45]
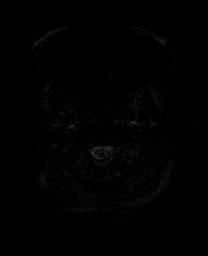
[im 23/45]
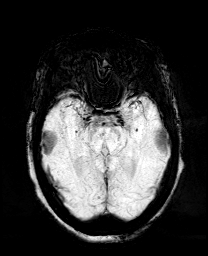
[im 45/45]
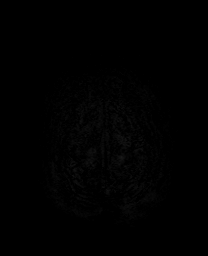

[Series 17: T2 post-contrast · coronal · 5.0mm · 0.72mm/px · 2 of 28 slices shown]
[im 1/28]
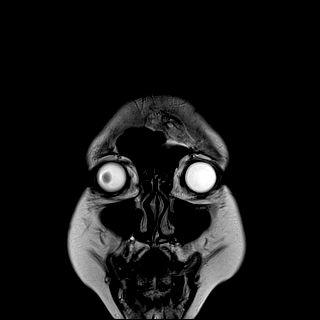
[im 28/28]
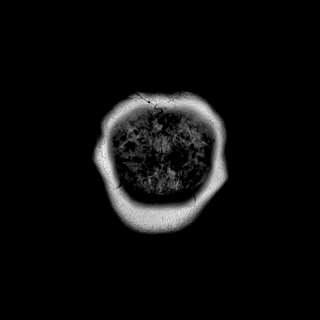

[Series 19: T1 post-contrast · coronal · 5.0mm · 0.34mm/px · 2 of 28 slices shown (1 of 2)]
[im 1/28]
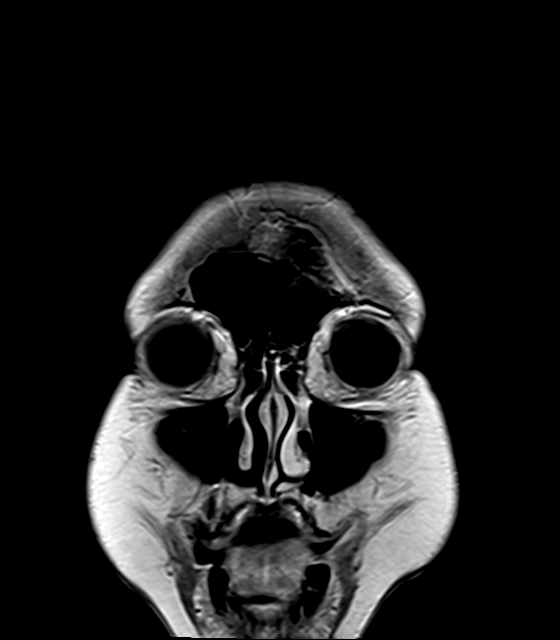
[im 28/28]
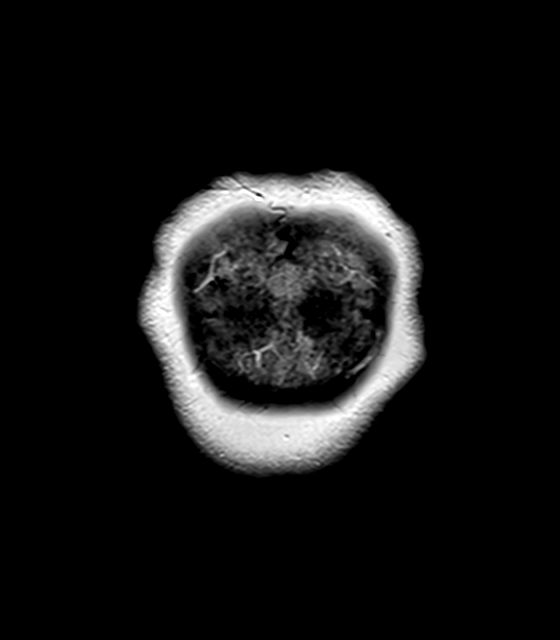

[Series 20: T1 post-contrast · sagittal · 5.0mm · 0.72mm/px · 2 of 23 slices shown (2 of 2)]
[im 1/23]
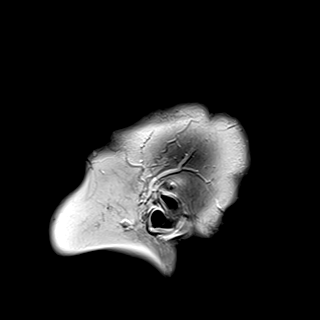
[im 23/23]
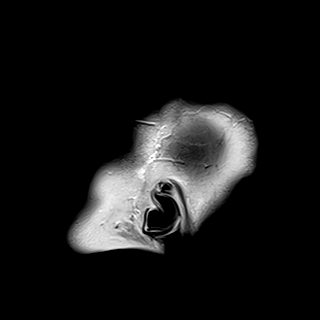

[40 of 48 positions shown; findings below may reference images not displayed]

FINDINGS: Brain: Small focus of abnormal diffusion restriction within the
right frontal corona radiata, near the base of the precentral gyrus.
Fewer than 5 scattered microhemorrhages in a nonspecific pattern.
There is multifocal hyperintense T2-weighted signal within the white
matter. Parenchymal volume and CSF spaces are normal. The midline
structures are normal. No abnormal contrast enhancement.

Vascular: Major flow voids are preserved.

Skull and upper cervical spine: Normal calvarium and skull base.
Visualized upper cervical spine and soft tissues are normal.

Sinuses/Orbits:No paranasal sinus fluid levels or advanced mucosal
thickening. No mastoid or middle ear effusion. Normal orbits.
IMPRESSION: 1. Small focus of acute ischemia within the right frontal corona
radiata, near the base of the precentral gyrus. No hemorrhage or
mass effect.
2. Findings of chronic microvascular ischemia.

## 2020-11-10 MED ORDER — SODIUM CHLORIDE 0.9 % IV BOLUS
1000.0000 mL | Freq: Once | INTRAVENOUS | Status: AC
  Administered 2020-11-10: 1000 mL via INTRAVENOUS

## 2020-11-10 MED ORDER — GADOBUTROL 1 MMOL/ML IV SOLN
7.0000 mL | Freq: Once | INTRAVENOUS | Status: AC | PRN
  Administered 2020-11-10: 7 mL via INTRAVENOUS

## 2020-11-10 MED ORDER — FENTANYL CITRATE (PF) 100 MCG/2ML IJ SOLN
50.0000 ug | Freq: Once | INTRAMUSCULAR | Status: AC
Start: 2020-11-10 — End: 2020-11-10
  Administered 2020-11-10: 50 ug via INTRAVENOUS
  Filled 2020-11-10: qty 2

## 2020-11-10 NOTE — ED Notes (Signed)
All charting completed by Evette Cristal, Rn

## 2020-11-10 NOTE — ED Triage Notes (Signed)
Pt arrives via [facility] as tx from [facility] for eval of C1 fx.

## 2020-11-10 NOTE — ED Notes (Signed)
Report given to Pattricia Boss at [facility] ED.

## 2020-11-10 NOTE — ED Notes (Signed)
Patient transported to CT 

## 2020-11-10 NOTE — ED Provider Notes (Signed)
[facility] EMERGENCY DEPARTMENT Provider Note   CSN: [id number] Arrival date & time: 11/10/20  1620     History Chief Complaint  Patient presents with   Headache   Weakness    Diana [name] is a 65 y.o. female.  The history is provided by the patient and medical records.  Headache Associated symptoms: weakness   Weakness Associated symptoms: headaches   Diana [name] is a 65 y.o. female who presents to the Emergency Department complaining of weakness. She presents the emergency department for feeling unwell for about a week. She reports sweating episodes and feeling shaky and weak all over. Symptoms are constant and worsening. She has associated frontal headache, unclear when it started. She describes it is mild in nature. She states that she wants to make sure that she is not dehydrated and that she hasn't had a stroke.  No fever, chest pain, sob, N/V/D, dysuria.  Lives alone.    No tobacco, alcohol or street drugs.      Past Medical History:  Diagnosis Date   Allergy    Arthritis    feet   Bronchitis 10/30/2016   Carpal tunnel syndrome    Coronary artery disease    GERD (gastroesophageal reflux disease)    Hyperlipidemia    Hypertension    Non-ST elevation (NSTEMI) myocardial infarction (HCC) 10/30/2016   NSTEMI (non-ST elevated myocardial infarction) (HCC) 10/30/2016    Patient Active Problem List   Diagnosis Date Noted   Normocytic anemia 10/15/2019   Dysphagia 09/17/2019   Costochondritis 06/07/2019   Seasonal allergic rhinitis due to pollen 11/03/2018   Arthropathy of cervical facet joint 11/01/2018   Neural foraminal stenosis of cervical spine 11/01/2018   Atypical chest pain 10/24/2018   Adjustment disorder with mixed anxiety and depressed mood 09/05/2018   Insomnia 03/03/2018   S/P CABG x 4 01/10/2018   Hyperlipidemia 01/09/2018   Unstable angina (HCC) 01/05/2018   CAD (coronary artery disease), native coronary artery 12/28/2017    Asymptomatic microscopic hematuria 11/14/2017   Iron deficiency anemia 09/05/2017   Cervicalgia 12/18/2015   Benign essential HTN 04/08/2015   Metatarsalgia of right foot 02/11/2015    Past Surgical History:  Procedure Laterality Date   CARPAL TUNNEL RELEASE Left 02/12/2016   Procedure: LEFT CARPAL TUNNEL RELEASE;  Surgeon: Cindee Salt, MD;  Location: [facility];  Service: Orthopedics;  Laterality: Left;  FAB   CORONARY ARTERY BYPASS GRAFT N/A 01/10/2018   Procedure: CORONARY ARTERY BYPASS GRAFTING (CABG) TIMES 4, USING LEFT INTERNAL MAMMARY ARTERY TO OM1  AND ENDOSCOPICALLY HARVESTED RIGHT SAPHENOUS VEIN TO DIAGONAL, AND SEQUENTIALLY TO OM2 AND DISTAL CIRCUMFLEX;  Surgeon: Delight Ovens, MD;  Location: [facility] OR;  Service: Open Heart Surgery;  Laterality: N/A;   LEFT HEART CATH AND CORONARY ANGIOGRAPHY N/A 01/05/2018   Procedure: LEFT HEART CATH AND CORONARY ANGIOGRAPHY;  Surgeon: Lennette Bihari, MD;  Location: [facility] INVASIVE CV LAB;  Service: Cardiovascular;  Laterality: N/A;   TEE WITHOUT CARDIOVERSION N/A 01/10/2018   Procedure: TRANSESOPHAGEAL ECHOCARDIOGRAM (TEE);  Surgeon: Delight Ovens, MD;  Location: [facility] OR;  Service: Open Heart Surgery;  Laterality: N/A;   TONSILLECTOMY     TUBAL LIGATION     WRIST SURGERY Right      OB History   No obstetric history on file.     Family History  Problem Relation Age of Onset   Thyroid disease Mother    Hypertension Mother    Brain cancer Sister  HIV Sister    Healthy Brother    Liver disease Brother     Social History   Tobacco Use   Smoking status: Never   Smokeless tobacco: Never  Vaping Use   Vaping Use: Never used  Substance Use Topics   Alcohol use: No   Drug use: No    Home Medications Prior to Admission medications   Medication Sig Start Date End Date Taking? Authorizing Provider  acetaminophen (TYLENOL) 500 MG tablet Take 500 mg by mouth every 6 (six) hours as needed.    [provider]   Adhesive Tape (MUELLER KINESIOLOGY) TAPE Apply as directed to reduce muscle/joint pain 09/03/19   [provider]  ALPRAZolam Prudy Feeler) 0.5 MG tablet Take 1 tablet (0.5 mg total) by mouth 2 (two) times daily as needed for anxiety. 02/07/18   Noni Saupe, MD  aspirin EC 81 MG tablet Take 81 mg by mouth daily.    [provider]  atorvastatin (LIPITOR) 80 MG tablet Take 0.5 tablets (40 mg total) by mouth daily at 6 PM. 04/05/18   Georgeanna Lea, MD  cetirizine (ZYRTEC) 10 MG tablet Take 10 mg by mouth daily.    [provider]  DULoxetine (CYMBALTA) 30 MG capsule Take 30 mg by mouth daily.    [provider]  ferrous gluconate (FERGON) 324 MG tablet Take 2 tablets by mouth daily.  12/04/18   [provider]  losartan-hydrochlorothiazide (HYZAAR) 50-12.5 MG tablet Take 1 tablet by mouth daily.    [provider]  meloxicam (MOBIC) 15 MG tablet Take 1 tablet by mouth daily.  02/19/19   [provider]  Metoprolol Tartrate 75 MG TABS Take 75 mg by mouth every 12 (twelve) hours. 01/19/18   Ardelle Balls, PA-C  nitroGLYCERIN (NITROSTAT) 0.4 MG SL tablet Place 1 tablet (0.4 mg total) under the tongue every 5 (five) minutes as needed for chest pain. 05/30/18 10/24/19  Camnitz, Andree Coss, MD  omeprazole (PRILOSEC) 20 MG capsule Take 1 capsule (20 mg total) by mouth daily. 12/15/17   Sherren Mocha, MD  pregabalin (LYRICA) 200 MG capsule Take 150 mg by mouth 3 (three) times daily. Held by MD 03/27/18   [provider]  ranolazine (RANEXA) 500 MG 12 hr tablet Take 1,000 mg by mouth 2 (two) times daily.    [provider]  traZODone (DESYREL) 50 MG tablet Take 25-50 mg by mouth at bedtime as needed for sleep. 03/27/18   [provider]  Vitamin D, Cholecalciferol, 50 MCG (2000 UT) CAPS Take 1 capsule by mouth daily.    [provider]    Allergies    Contrast media [iodinated diagnostic agents],  Penicillins, Methocarbamol, and Codeine  Review of Systems   Review of Systems  Neurological:  Positive for weakness and headaches.  All other systems reviewed and are negative.  Physical Exam Updated Vital Signs BP 126/86 (BP Location: Right Arm)   Pulse 68   Temp 97.6 F (36.4 C) (Oral)   Resp 20   Ht 5\' 2"  (1.575 m)   Wt 68 kg   SpO2 98%   BMI 27.44 kg/m   Physical Exam Vitals and nursing note reviewed.  Constitutional:      Appearance: She is well-developed.  HENT:     Head: Normocephalic and atraumatic.  Cardiovascular:     Rate and Rhythm: Normal rate and regular rhythm.     Heart sounds: No murmur heard. Pulmonary:  Effort: Pulmonary effort is normal. No respiratory distress.     Breath sounds: Normal breath sounds.  Abdominal:     Palpations: Abdomen is soft.     Tenderness: There is no abdominal tenderness. There is no guarding or rebound.  Musculoskeletal:        General: No swelling or tenderness.  Skin:    General: Skin is warm and dry.  Neurological:     Mental Status: She is alert and oriented to person, place, and time.     Comments: Slow speech. Slow to answer questions. 4/5 strength in BUE, 3/5 strength in BLE,slightly weaker in RLE than left (pt states due to ongoing injury). She does have trembling/jerking activity at attempts to initiate movements. 2-3+ patellar reflexes bilaterally  Psychiatric:     Comments: Flat affect    ED Results / Procedures / Treatments   Labs (all labs ordered are listed, but only abnormal results are displayed) Labs Reviewed  CBG MONITORING, ED    EKG EKG Interpretation  Date/Time:  Monday November 10 2020 16:50:17 EDT Ventricular Rate:  73 PR Interval:  158 QRS Duration: 80 QT Interval:  406 QTC Calculation: 447 R Axis:   69 Text Interpretation: Normal sinus rhythm Nonspecific ST abnormality Abnormal ECG Confirmed by Tilden Fossa [phone]) on 11/10/2020 5:05:00 PM  Radiology No results  found.  Procedures Procedures   Medications Ordered in ED Medications - No data to display  ED Course  I have reviewed the triage vital signs and the nursing notes.  Pertinent labs & imaging results that were available during my care of the patient were reviewed by me and considered in my medical decision making (see chart for details).    MDM Rules/Calculators/A&P                         patient here for evaluation of one week or more of generalized weakness, headache. She is very difficult to obtain history from she cannot describe any of her falls or dates when things occurred.  On repeat assessment patient states that she has had neck pain since before her CABG procedure. She states it was due to a fall with trauma to her neck. She also reports recurrent falls recently but is unsure when her last fall was. She does state her neck is been hurting worse since she started physical therapy 1 to 2 weeks ago  CT scan is significant for C1 fracture, thought to be acute. Discussed with Dr. Danielle Dess with neurosurgery, who recommends MRI to further evaluate.  Discussed with Dr. Rush Landmark in the [facility] emergency department who accepts the patient in transfer..  Final Clinical Impression(s) / ED Diagnoses Final diagnoses:  None    Rx / DC Orders ED Discharge Orders     None        Tilden Fossa, MD 11/10/20 2322

## 2020-11-10 NOTE — ED Triage Notes (Signed)
Generalized weakness x 1 weeks, states feels shaky, and has unsteady gait, reports HA. States h/o stroke in 2020 and CABG in 2020. Speech articulation slow, baseline unknown

## 2020-11-10 NOTE — ED Notes (Signed)
[facility] at bedside to transport patient. No acute distress noted.  

## 2020-11-11 ENCOUNTER — Observation Stay (HOSPITAL_COMMUNITY): Payer: Medicare Other

## 2020-11-11 ENCOUNTER — Observation Stay (HOSPITAL_BASED_OUTPATIENT_CLINIC_OR_DEPARTMENT_OTHER): Payer: Medicare Other

## 2020-11-11 ENCOUNTER — Encounter: Payer: Medicare Other | Admitting: Physical Therapy

## 2020-11-11 ENCOUNTER — Encounter (HOSPITAL_COMMUNITY): Payer: Self-pay | Admitting: Internal Medicine

## 2020-11-11 DIAGNOSIS — I6389 Other cerebral infarction: Secondary | ICD-10-CM | POA: Diagnosis not present

## 2020-11-11 DIAGNOSIS — I679 Cerebrovascular disease, unspecified: Secondary | ICD-10-CM | POA: Diagnosis not present

## 2020-11-11 DIAGNOSIS — I639 Cerebral infarction, unspecified: Secondary | ICD-10-CM | POA: Diagnosis present

## 2020-11-11 DIAGNOSIS — I10 Essential (primary) hypertension: Secondary | ICD-10-CM | POA: Diagnosis not present

## 2020-11-11 LAB — CBC
HCT: 28.3 % — ABNORMAL LOW (ref 36.0–46.0)
Hemoglobin: 8.8 g/dL — ABNORMAL LOW (ref 12.0–15.0)
MCH: 25.3 pg — ABNORMAL LOW (ref 26.0–34.0)
MCHC: 31.1 g/dL (ref 30.0–36.0)
MCV: 81.3 fL (ref 80.0–100.0)
Platelets: 165 10*3/uL (ref 150–400)
RBC: 3.48 MIL/uL — ABNORMAL LOW (ref 3.87–5.11)
RDW: 15.4 % (ref 11.5–15.5)
WBC: 3.3 10*3/uL — ABNORMAL LOW (ref 4.0–10.5)
nRBC: 0 % (ref 0.0–0.2)

## 2020-11-11 LAB — LIPID PANEL
Cholesterol: 123 mg/dL (ref 0–200)
HDL: 41 mg/dL (ref >40)
LDL Cholesterol: 68 mg/dL (ref 0–99)
Total CHOL/HDL Ratio: 3 RATIO
Triglycerides: 72 mg/dL (ref <150)
VLDL: 14 mg/dL (ref 0–40)

## 2020-11-11 LAB — ECHOCARDIOGRAM COMPLETE
Area-P 1/2: 3.65 cm2
Height: 62 in
S' Lateral: 2.5 cm
Weight: 2400 oz

## 2020-11-11 LAB — HIV ANTIBODY (ROUTINE TESTING W REFLEX): HIV Screen 4th Generation wRfx: NONREACTIVE

## 2020-11-11 LAB — RAPID URINE DRUG SCREEN, HOSP PERFORMED
Amphetamines: NOT DETECTED
Barbiturates: NOT DETECTED
Benzodiazepines: NOT DETECTED
Cocaine: NOT DETECTED
Opiates: NOT DETECTED
Tetrahydrocannabinol: NOT DETECTED

## 2020-11-11 LAB — CREATININE, SERUM
Creatinine, Ser: 0.9 mg/dL (ref 0.44–1.00)
GFR, Estimated: >60 mL/min (ref >60)

## 2020-11-11 IMAGING — MR MR MRA HEAD W/O CM
2 series · 19 of 48 positions shown · non-contrast
Comparison: No pertinent prior exam.

CLINICAL DATA: Stroke follow-up. Small acute white matter infarct
in the posterior right frontal lobe on MRI yesterday.

EXAM:
MRA HEAD WITHOUT CONTRAST
TECHNIQUE: Angiographic images of the Circle of Willis were acquired using MRA
technique without intravenous contrast.

[Series 3: (id) mt fs · axial · 1.4mm · 0.43mm/px · z∈[-84,-2]mm · 18 of 136 slices shown]
[im 1/136]
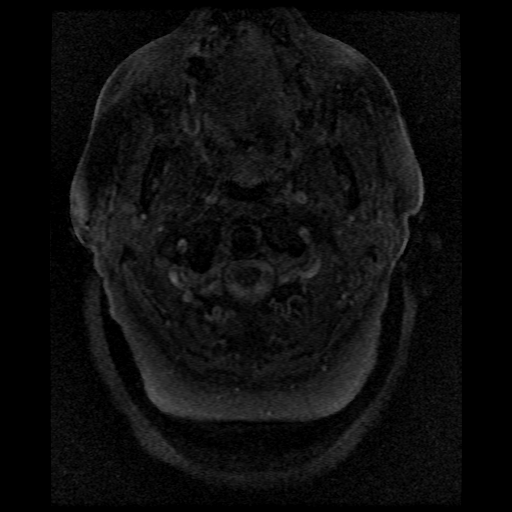
[im 3/136]
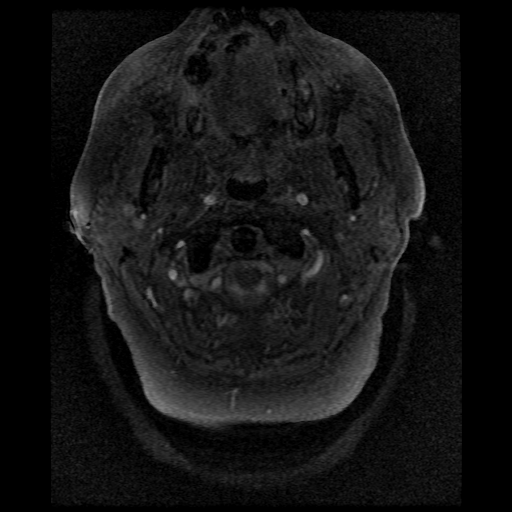
[im 6/136]
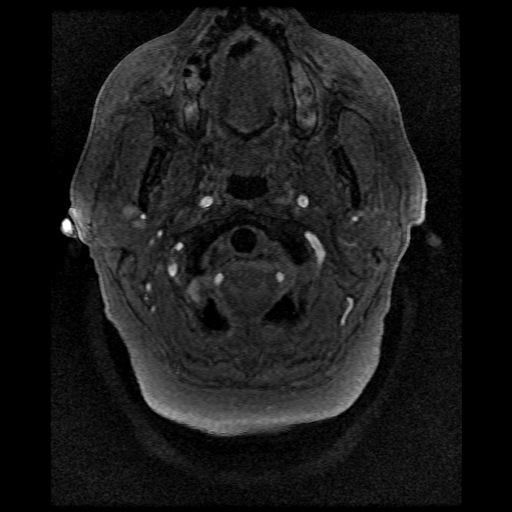
[im 9/136]
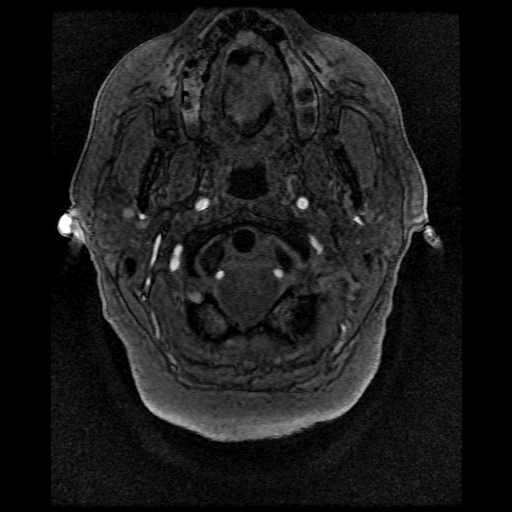
[im 12/136]
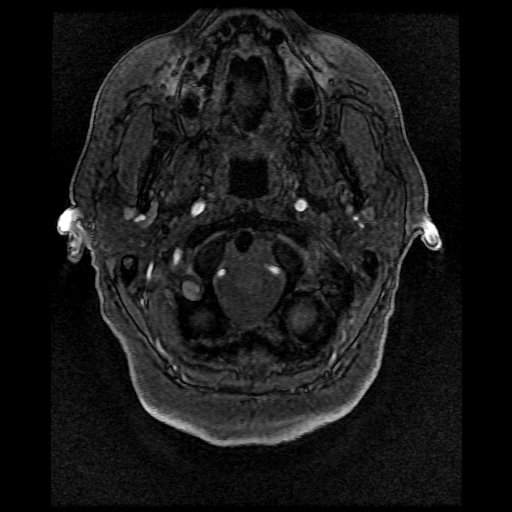
[im 15/136]
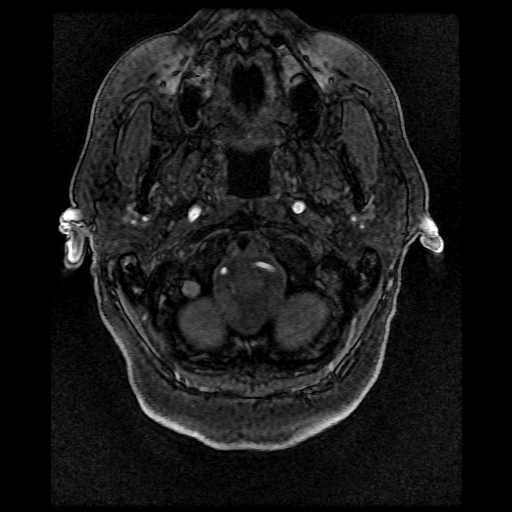
[im 18/136]
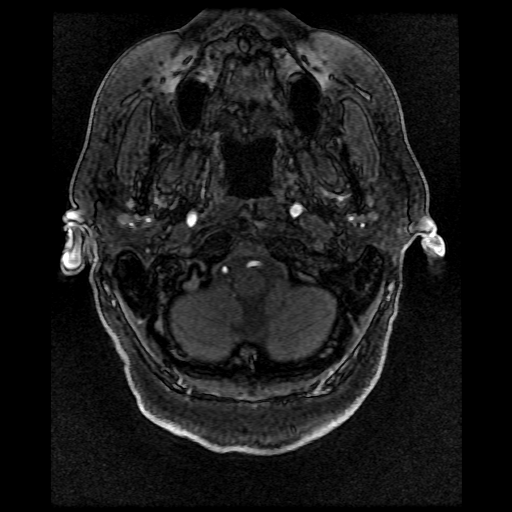
[im 21/136]
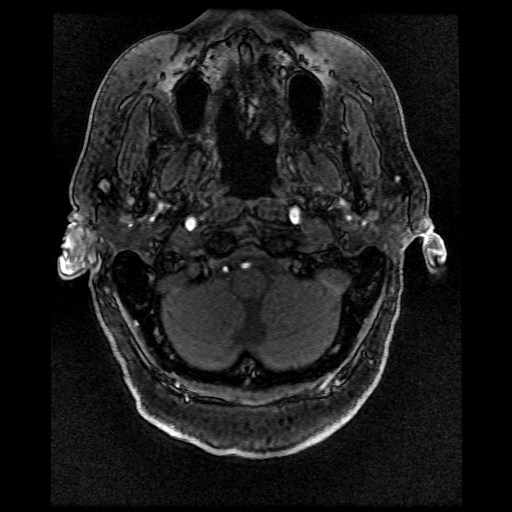
[im 24/136]
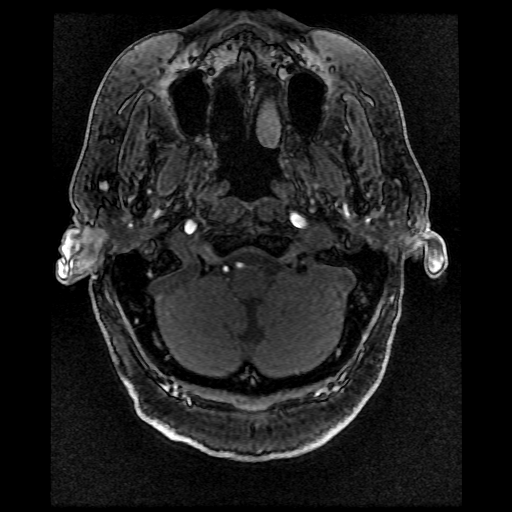
[im 27/136]
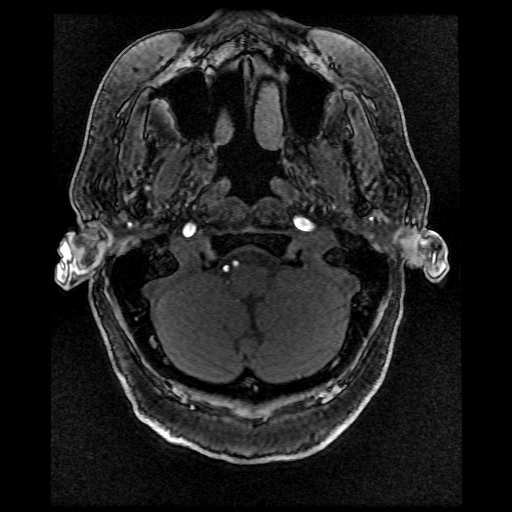
[im 42/136]
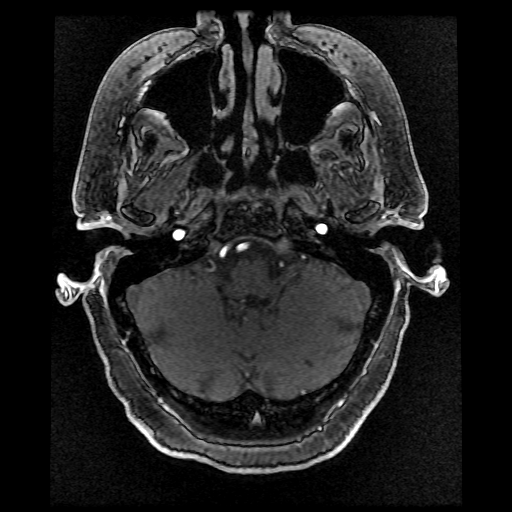
[im 59/136]
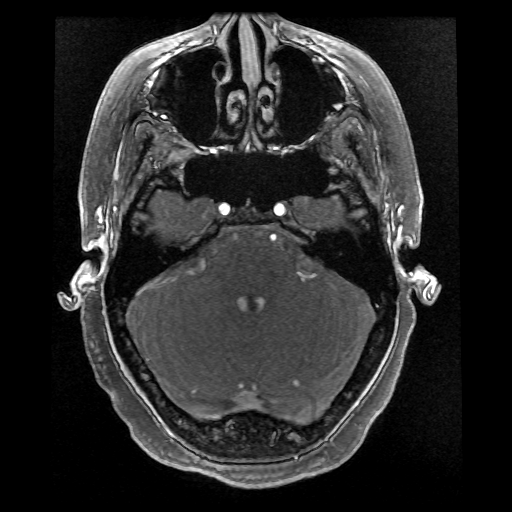
[im 68/136]
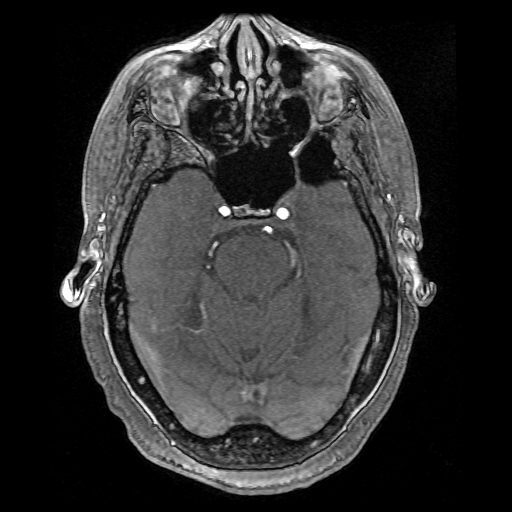
[im 77/136]
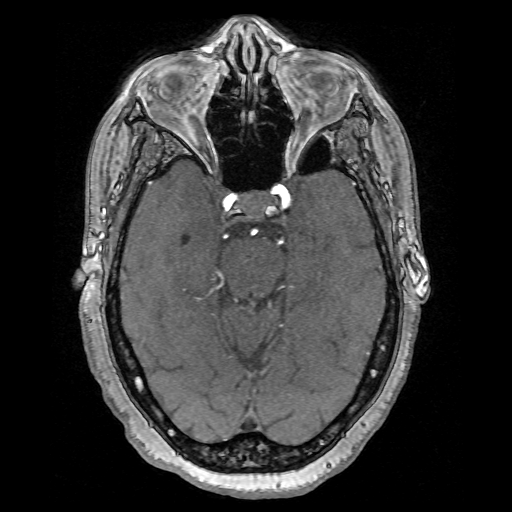
[im 94/136]
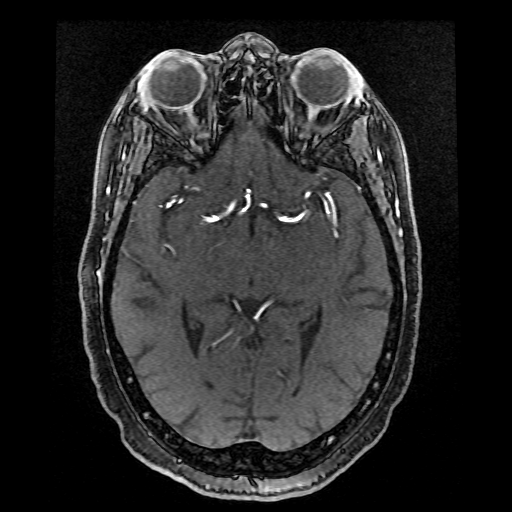
[im 112/136]
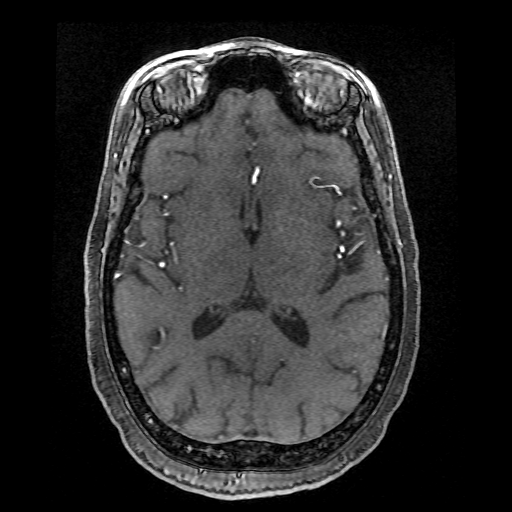
[im 115/136]
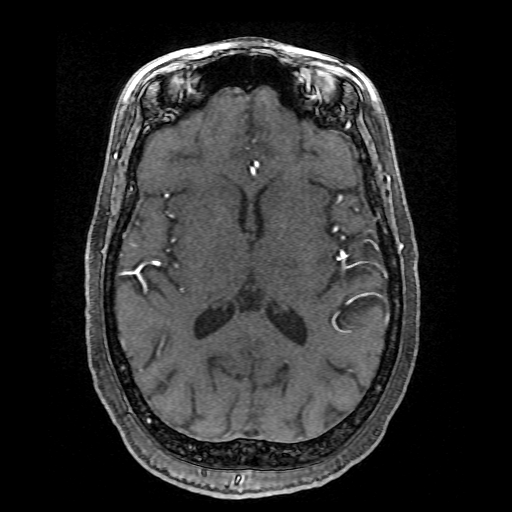
[im 130/136]
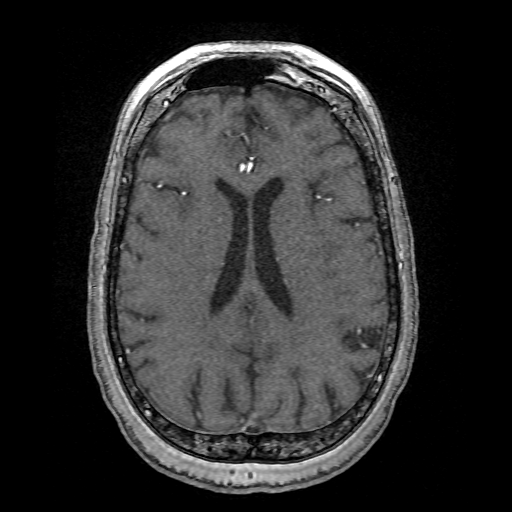

[Series 300: col:(id) mt fs · axial · 1.4mm · 0.43mm/px · 1 of 1 slices shown]
[im 1/1]
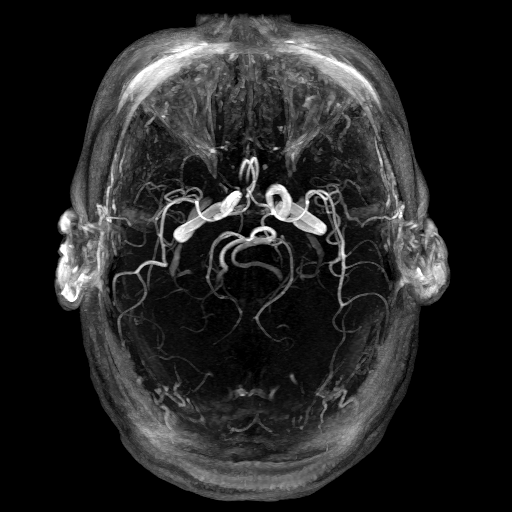

[19 of 48 positions shown; findings below may reference images not displayed]

FINDINGS: Anterior circulation: The internal carotid arteries are widely
patent from skull base to carotid termini. ACAs and MCAs are patent
without evidence of a proximal branch occlusion or significant
proximal stenosis. No aneurysm is identified.

Posterior circulation: The visualized distal vertebral arteries are
widely patent to the basilar and codominant. Patent right PICA,
bilateral AICA, and bilateral SCA origins are identified. The
basilar artery is widely patent. There are large posterior
communicating arteries bilaterally. The left P1 segment is
hypoplastic. Both PCAs are patent without evidence of a significant
proximal stenosis. No aneurysm is identified.

Anatomic variants: Fetal left PCA.
IMPRESSION: Negative head MRA.

## 2020-11-11 MED ORDER — MONTELUKAST SODIUM 10 MG PO TABS
10.0000 mg | ORAL_TABLET | Freq: Every day | ORAL | Status: DC
  Filled 2020-11-11: qty 1

## 2020-11-11 MED ORDER — RANOLAZINE ER 500 MG PO TB12
1000.0000 mg | ORAL_TABLET | Freq: Two times a day (BID) | ORAL | Status: DC
Start: 2020-11-11 — End: 2020-11-11
  Administered 2020-11-11: 1000 mg via ORAL
  Filled 2020-11-11 (×2): qty 2

## 2020-11-11 MED ORDER — DULOXETINE HCL 60 MG PO CPEP
60.0000 mg | ORAL_CAPSULE | Freq: Every day | ORAL | Status: DC
  Administered 2020-11-11: 60 mg via ORAL
  Filled 2020-11-11 (×2): qty 1

## 2020-11-11 MED ORDER — ATORVASTATIN CALCIUM 40 MG PO TABS
40.0000 mg | ORAL_TABLET | Freq: Every day | ORAL | Status: DC
  Administered 2020-11-11: 40 mg via ORAL
  Filled 2020-11-11: qty 1

## 2020-11-11 MED ORDER — ASPIRIN 325 MG PO TABS: 325.0000 mg | ORAL_TABLET | Freq: Every day | ORAL | 11 refills | Status: AC

## 2020-11-11 MED ORDER — ACETAMINOPHEN 160 MG/5ML PO SOLN: 650.0000 mg | ORAL | Status: DC | PRN

## 2020-11-11 MED ORDER — FERROUS GLUCONATE 324 (38 FE) MG PO TABS
648.0000 mg | ORAL_TABLET | Freq: Every day | ORAL | Status: DC
  Administered 2020-11-11: 648 mg via ORAL
  Filled 2020-11-11: qty 2

## 2020-11-11 MED ORDER — HYDROXYZINE HCL 10 MG PO TABS
10.0000 mg | ORAL_TABLET | Freq: Three times a day (TID) | ORAL | Status: DC | PRN
  Filled 2020-11-11: qty 1

## 2020-11-11 MED ORDER — STROKE: EARLY STAGES OF RECOVERY BOOK: Freq: Once | Status: AC

## 2020-11-11 MED ORDER — PANTOPRAZOLE SODIUM 40 MG PO TBEC
40.0000 mg | DELAYED_RELEASE_TABLET | Freq: Every day | ORAL | Status: DC
  Administered 2020-11-11: 40 mg via ORAL
  Filled 2020-11-11: qty 1

## 2020-11-11 MED ORDER — PREGABALIN 25 MG PO CAPS
150.0000 mg | ORAL_CAPSULE | Freq: Two times a day (BID) | ORAL | Status: DC
  Administered 2020-11-11: 150 mg via ORAL
  Filled 2020-11-11: qty 2

## 2020-11-11 MED ORDER — ENOXAPARIN SODIUM 40 MG/0.4ML IJ SOSY: 40.0000 mg | PREFILLED_SYRINGE | INTRAMUSCULAR | Status: DC

## 2020-11-11 MED ORDER — NAPROXEN 500 MG PO TABS: 500.0000 mg | ORAL_TABLET | Freq: Two times a day (BID) | ORAL | 2 refills | Status: AC

## 2020-11-11 MED ORDER — SODIUM CHLORIDE 0.9 % IV SOLN: INTRAVENOUS | Status: DC

## 2020-11-11 MED ORDER — ALPRAZOLAM 0.25 MG PO TABS: 0.5000 mg | ORAL_TABLET | Freq: Two times a day (BID) | ORAL | Status: DC | PRN

## 2020-11-11 MED ORDER — ACETAMINOPHEN 650 MG RE SUPP: 650.0000 mg | RECTAL | Status: DC | PRN

## 2020-11-11 MED ORDER — ASPIRIN EC 81 MG PO TBEC: 81.0000 mg | DELAYED_RELEASE_TABLET | Freq: Every day | ORAL | Status: DC

## 2020-11-11 MED ORDER — ACETAMINOPHEN 325 MG PO TABS
650.0000 mg | ORAL_TABLET | ORAL | Status: DC | PRN
  Administered 2020-11-11: 650 mg via ORAL
  Filled 2020-11-11: qty 2

## 2020-11-11 MED ORDER — HYDRALAZINE HCL 20 MG/ML IJ SOLN: 10.0000 mg | INTRAMUSCULAR | Status: DC | PRN

## 2020-11-11 MED ORDER — CLOPIDOGREL BISULFATE 75 MG PO TABS: 75.0000 mg | ORAL_TABLET | Freq: Every day | ORAL | Status: DC

## 2020-11-11 MED ORDER — ASPIRIN 325 MG PO TABS
325.0000 mg | ORAL_TABLET | Freq: Every day | ORAL | Status: DC
  Administered 2020-11-11: 325 mg via ORAL
  Filled 2020-11-11: qty 1

## 2020-11-11 MED ORDER — NAPROXEN 250 MG PO TABS
500.0000 mg | ORAL_TABLET | Freq: Two times a day (BID) | ORAL | Status: DC
  Administered 2020-11-11: 500 mg via ORAL
  Filled 2020-11-11: qty 2

## 2020-11-11 MED ORDER — METOPROLOL TARTRATE 25 MG PO TABS
75.0000 mg | ORAL_TABLET | Freq: Two times a day (BID) | ORAL | Status: DC
  Filled 2020-11-11 (×2): qty 3

## 2020-11-11 NOTE — Progress Notes (Signed)
PT Cancellation Note  Patient Details Name: Diana [name] MRN: [id number] DOB: 05-24-1956   Cancelled Treatment:    Reason Eval/Treat Not Completed: Medical issues which prohibited therapy;Patient at procedure or test/unavailable (Pt in MRI.  Await clearance by MD regarding possible C1 Fracture.  Will return after MD advisement.)   Bevelyn Buckles 11/11/2020, 9:40 AM Pavlos Yon M,PT Acute Rehab Services [phone] (pager)

## 2020-11-11 NOTE — Progress Notes (Signed)
Carotid duplex has been completed.   Preliminary results in CV Proc.   Blanch Media 11/11/2020 11:52 AM

## 2020-11-11 NOTE — ED Notes (Signed)
OT/PT at bedside working with patient.

## 2020-11-11 NOTE — ED Notes (Signed)
Patient will be discharged per attending MD. The patient has been notified along with bed control.

## 2020-11-11 NOTE — Evaluation (Signed)
Physical Therapy Evaluation & Discharge Patient Details Name: Diana [name] MRN: [id number] DOB: 08/06/1955 Today's Date: 11/11/2020   History of Present Illness  Pt is a 65 y.o. presenting 11/10/20 with several days of weakness, generally feeling unwell, and frequent falls. MRI showed small focus of acute ischemia within the right frontal corona radiata. MRI neck confirms that there is no acute C1 fx. PMH includes arthritis, CABG, HTN, NSTEMI.   Clinical Impression  Patient evaluated by Physical Therapy with no further acute PT needs identified. PTA, pt independent, drives, lives alone; currently working with outpatient PT on falls and instability. Today, pt moving well without DME, supervision for safety due to fall risk and slight deficits noted with higher level balance tasks. Pt reports family can be available for initial supervision and transportation upon return home. All education has been completed and the patient has no further questions. Acute PT is signing off. Thank you for this referral.  BP 124/74, HR 69-70s, SpO2 98% on RA    Follow Up Recommendations Outpatient PT;Supervision - Intermittent    Equipment Recommendations  None recommended by PT    Recommendations for Other Services       Precautions / Restrictions Precautions Precautions: Fall Restrictions Weight Bearing Restrictions: No      Mobility  Bed Mobility Overal bed mobility: Independent             General bed mobility comments: HOB slightly elevated    Transfers Overall transfer level: Needs assistance Equipment used: None Transfers: Sit to/from Stand Sit to Stand: Supervision            Ambulation/Gait Ambulation/Gait assistance: Min guard;Supervision Gait Distance (Feet): 400 Feet Assistive device: None Gait Pattern/deviations: Step-through pattern;Decreased stride length Gait velocity: Decreased   General Gait Details: Mostly steady gait without DME, initial min guard for balance  progressing to supervision for safety  Stairs            Wheelchair Mobility    Modified Rankin (Stroke Patients Only) Modified Rankin (Stroke Patients Only) Pre-Morbid Rankin Score: No significant disability Modified Rankin: Slight disability     Balance Overall balance assessment: Needs assistance Sitting-balance support: No upper extremity supported;Feet unsupported Sitting balance-Leahy Scale: Good Sitting balance - Comments: Able to adjust bilateral socks sitting EOB   Standing balance support: No upper extremity supported;During functional activity Standing balance-Leahy Scale: Good Standing balance comment: Can static stand and perform dynamic tasks without UE support             High level balance activites: Side stepping;Backward walking;Direction changes;Turns;Sudden stops;Head turns High Level Balance Comments: slight instability noted with stepping over objects, no LOB     Dynamic Gait Index Level Surface: Mild Impairment Change in Gait Speed: Mild Impairment Gait with Horizontal Head Turns: Normal Gait with Vertical Head Turns: Normal Gait and Pivot Turn: Normal Step Over Obstacle: Mild Impairment Step Around Obstacles: Normal Steps: Mild Impairment Total Score: 20       Pertinent Vitals/Pain Pain Assessment: No/denies pain    Home Living Family/patient expects to be discharged to:: Private residence Living Arrangements: Alone Available Help at Discharge: Family;Available PRN/intermittently Type of Home: Other(Comment) (condo) Home Access: Stairs to enter Entrance Stairs-Rails: Right;Left Entrance Stairs-Number of Steps: a few Home Layout: Two level;Able to live on main level with bedroom/bathroom Home Equipment: None      Prior Function Level of Independence: Independent         Comments: going to outpt PT 2-3x/wk due to falls and weakness;  drives; manages medications; cooks primarily using microwave and air fryer     Hand  Dominance   Dominant Hand: Right    Extremity/Trunk Assessment   Upper Extremity Assessment Upper Extremity Assessment: LUE deficits/detail LUE Coordination: decreased fine motor (mild coordination deficits; "clumsy" but using functionally)    Lower Extremity Assessment Lower Extremity Assessment: RLE deficits/detail;LLE deficits/detail RLE Deficits / Details: Strength >/ 4/5 throughout, pt denies numbness/tingling LLE Deficits / Details: Strength >/ 4/5 throughout, pt denies numbness/tingling; pt notes L foot feels heavy when lifting it, DF WFL    Cervical / Trunk Assessment Cervical / Trunk Assessment: Normal;Other exceptions (complains of R flank discomfort - since her CABG)  Communication   Communication: No difficulties;Other (comment) (pt states baseline speech)  Cognition Arousal/Alertness: Awake/alert Behavior During Therapy: WFL for tasks assessed/performed Overall Cognitive Status: No family/caregiver present to determine baseline cognitive functioning                                 General Comments: most likely cognitive deficits at baseline after CABG per conservation with pt. slow processing noted.      General Comments General comments (skin integrity, edema, etc.): Resting BP 124/74, HR 70s, SpO2 98% on RA. Educ pt on stroke signs/symptoms, teach back method utilized    Exercises     Assessment/Plan    PT Assessment All further PT needs can be met in the next venue of care  PT Problem List Decreased strength;Decreased balance;Decreased coordination;Decreased cognition       PT Treatment Interventions      PT Goals (Current goals can be found in the Care Plan section)  Acute Rehab PT Goals Patient Stated Goal: to go home PT Goal Formulation: All assessment and education complete, DC therapy    Frequency     Barriers to discharge        Co-evaluation               AM-PAC PT "6 Clicks" Mobility  Outcome Measure Help needed  turning from your back to your side while in a flat bed without using bedrails?: None Help needed moving from lying on your back to sitting on the side of a flat bed without using bedrails?: None Help needed moving to and from a bed to a chair (including a wheelchair)?: A Little Help needed standing up from a chair using your arms (e.g., wheelchair or bedside chair)?: A Little Help needed to walk in hospital room?: A Little Help needed climbing 3-5 steps with a railing? : A Little 6 Click Score: 20    End of Session Equipment Utilized During Treatment: Gait belt Activity Tolerance: Patient tolerated treatment well Patient left: in bed;with call bell/phone within reach (ED stretcher) Nurse Communication: Mobility status PT Visit Diagnosis: Other abnormalities of gait and mobility (R26.89)    Time: 3491-7915 PT Time Calculation (min) (ACUTE ONLY): 25 min   Charges:   PT Evaluation $PT Eval Low Complexity: [place], PT, DPT Acute Rehabilitation Services  Pager [phone] Office [place] 11/11/2020, 5:25 PM

## 2020-11-11 NOTE — Consult Note (Signed)
Neurology Consultation  Reason for Consult: Stroke Referring Physician: Sharilyn Sites, PA-C, ER  CC: Weakness  History is obtained from: Patient, chart  HPI: Diana [name] is a 65 y.o. female past medical history of coronary artery disease status post CABG, hypertension, hyperlipidemia, presented to the emergency room for evaluation of weakness and was noted to have a small stroke on MRI of the brain.  Patient reports that her last known well was about a week or so ago when she started getting some lightheadedness.  She reports that ever since her CABG in 2019, she has not been able to resume all her normal activities.  She started getting the "shakes" a week or so ago and has had trouble walking normally.  She has had multiple falls and also hit her head on 1 occasion to the ground.  She was having neck pain and was receiving some physical therapy for this as an outpatient as well.  She came to the emergency room at [facility] today for this generalized weakness and difficulty walking as well as new symptoms of slurred speech that began sometime today. She is accompanied by her mother who is at the bedside, who also supplemented some of the history.  She has history of depression, family was unaware of this.  Noncontrast head CT at [facility] was unremarkable.  Noncontrasted CT of the C-spine was concerning for a C1 fracture. Case was discussed over the phone with the on-call neurologist for further input, who recommended an MRI of the brain and C-spine given her history of falls and neck pain as well as abnormal CT cervical spine findings. MRI of the C-spine did not reveal an acute fracture but the MRI of the brain reveals a punctate area of infarction as described below. The ED provider was somewhat concerned that the patient's examination has some functional overlay but did not want to miss any underlying acute pathology, for which she was transferred over to [facility]. Neurological consultation was obtained after the brain MRI revealed the abnormal area-small infarction.  LKW: A week ago tpa given?: no, outside the window Premorbid modified Rankin scale (mRS):2-Post CABG  ROS: Full ROS was performed and is negative except as noted in the HPI.   Past Medical History:  Diagnosis Date   Allergy    Arthritis    feet   Bronchitis 10/30/2016   Carpal tunnel syndrome    Coronary artery disease    GERD (gastroesophageal reflux disease)    Hyperlipidemia    Hypertension    Non-ST elevation (NSTEMI) myocardial infarction (HCC) 10/30/2016   NSTEMI (non-ST elevated myocardial infarction) (HCC) 10/30/2016    Family History  Problem Relation Age of Onset   Thyroid disease Mother    Hypertension Mother    Brain cancer Sister    HIV Sister    Healthy Brother    Liver disease Brother      Social History:   reports that she has never smoked. She has never used smokeless tobacco. She reports that she does not drink alcohol and does not use drugs.  Medications  Current Facility-Administered Medications:    aspirin tablet 325 mg, 325 mg, Oral, Daily, Garlon Hatchet, PA-C  Current Outpatient Medications:    acetaminophen (TYLENOL) 500 MG tablet, Take 500 mg by mouth every 6 (six) hours as needed for headache., Disp: , Rfl:    Adhesive Tape (MUELLER KINESIOLOGY) TAPE, Apply as directed to reduce muscle/joint pain, Disp: ,  Rfl:    ALPRAZolam (XANAX) 0.5 MG tablet, Take 1 tablet (0.5 mg total) by mouth 2 (two) times daily as needed for anxiety., Disp: 20 tablet, Rfl: 0   aspirin EC 81 MG tablet, Take 81 mg by mouth daily., Disp: , Rfl:    atorvastatin (LIPITOR) 40 MG tablet, Take 40 mg by mouth daily., Disp: , Rfl:    cetirizine (ZYRTEC) 10 MG tablet, Take 10 mg by mouth daily., Disp: , Rfl:    DULoxetine (CYMBALTA) 60 MG capsule, Take 60 mg by mouth daily., Disp: , Rfl:    ferrous gluconate (FERGON) 324 MG tablet, Take 2 tablets by mouth daily. ,  Disp: , Rfl:    hydrOXYzine (ATARAX/VISTARIL) 10 MG tablet, Take 10 mg by mouth 3 (three) times daily as needed for anxiety., Disp: , Rfl:    losartan-hydrochlorothiazide (HYZAAR) 50-12.5 MG tablet, Take 1 tablet by mouth daily., Disp: , Rfl:    meloxicam (MOBIC) 15 MG tablet, Take 15 mg by mouth daily., Disp: , Rfl:    Metoprolol Tartrate 75 MG TABS, Take 75 mg by mouth every 12 (twelve) hours., Disp: 60 tablet, Rfl: 1   montelukast (SINGULAIR) 10 MG tablet, Take 10 mg by mouth at bedtime., Disp: , Rfl:    nitroGLYCERIN (NITROSTAT) 0.4 MG SL tablet, Place 1 tablet (0.4 mg total) under the tongue every 5 (five) minutes as needed for chest pain., Disp: 25 tablet, Rfl: 3   omeprazole (PRILOSEC) 20 MG capsule, Take 1 capsule (20 mg total) by mouth daily., Disp: 90 capsule, Rfl: 1   polyethylene glycol powder (GLYCOLAX/MIRALAX) 17 GM/SCOOP powder, Take 17 g by mouth every evening., Disp: , Rfl:    pregabalin (LYRICA) 150 MG capsule, Take 150 mg by mouth 2 (two) times daily., Disp: , Rfl:    ranolazine (RANEXA) 500 MG 12 hr tablet, Take 1,000 mg by mouth 2 (two) times daily., Disp: , Rfl:    traZODone (DESYREL) 50 MG tablet, Take 25-50 mg by mouth at bedtime as needed for sleep., Disp: , Rfl:    vitamin C (ASCORBIC ACID) 500 MG tablet, Take 500 mg by mouth daily., Disp: , Rfl:    Vitamin D, Cholecalciferol, 50 MCG (2000 UT) CAPS, Take 2,000 Units by mouth daily., Disp: , Rfl:    atorvastatin (LIPITOR) 80 MG tablet, Take 0.5 tablets (40 mg total) by mouth daily at 6 PM. (Patient not taking: Reported on 11/11/2020), Disp: 60 tablet, Rfl: 1   Exam: Current vital signs: BP 134/81 (BP Location: Left Arm)   Pulse 73   Temp (!) 97.5 F (36.4 C) (Oral)   Resp 16   Ht 5\' 2"  (1.575 m)   Wt 68 kg   SpO2 95%   BMI 27.44 kg/m  Vital signs in last 24 hours: Temp:  [97.5 F (36.4 C)-98.2 F (36.8 C)] 97.5 F (36.4 C) (06/13 2158) Pulse Rate:  [66-73] 73 (06/14 0009) Resp:  [16-23] 16 (06/14 0009) BP:  (124-175)/(77-93) 134/81 (06/14 0009) SpO2:  [95 %-99 %] 95 % (06/14 0009) Weight:  [68 kg] 68 kg (06/13 1629)  GENERAL: Awake, alert in NAD HEENT: - Normocephalic and atraumatic, dry mm, no LN++, no Thyromegally LUNGS - Clear to auscultation bilaterally with no wheezes CV - S1S2 RRR, no m/r/g, equal pulses bilaterally. ABDOMEN - Soft, nontender, nondistended with normoactive BS Ext: warm, well perfused, intact peripheral pulses, no edema  NEURO:  Mental status: Awake alert oriented x3 Speech is hypophonic and mildly dysarthric There is no aphasia Cranial  nerves II to XII intact Motor examination with bilateral upper extremity strength that is symmetric 5/5 without drift.  Bilateral lower extremity strength has positive Hoover sign and volitional giveaway weakness-she is unable to lift either of the lower extremity against gravity. Sensory exam: Diminished on the left face but intact on the extremities. Coordination: No gross dysmetria in upper extremities, unable to perform lower extremities NIHSS 1a Level of Conscious.: 0 1b LOC Questions: 0 1c LOC Commands: 0 2 Best Gaze: 0 3 Visual: 0 4 Facial Palsy: 0 5a Motor Arm - left: 0 5b Motor Arm - Right: 0 6a Motor Leg - Left: 2 6b Motor Leg - Right: 2 7 Limb Ataxia: 0 8 Sensory: 1 9 Best Language: 0 10 Dysarthria: 1 11 Extinct. and Inatten.: 0 TOTAL: 6    Labs I have reviewed labs in epic and the results pertinent to this consultation are:   CBC    Component Value Date/Time   WBC 4.1 11/10/2020 1734   RBC 3.63 (L) 11/10/2020 1734   HGB 9.3 (L) 11/10/2020 1734   HGB 10.9 (L) 12/09/2017 0803   HGB 11.1 07/25/2017 1602   HCT 28.6 (L) 11/10/2020 1734   HCT 35.1 07/25/2017 1602   PLT 162 11/10/2020 1734   PLT 156 12/09/2017 0803   PLT 183 07/25/2017 1602   MCV 78.8 (L) 11/10/2020 1734   MCV 80.5 12/15/2017 1548   MCV 78 (L) 07/25/2017 1602   MCH 25.6 (L) 11/10/2020 1734   MCHC 32.5 11/10/2020 1734   RDW 15.2  11/10/2020 1734   RDW 15.8 (H) 07/25/2017 1602   LYMPHSABS 1.5 11/10/2020 1734   LYMPHSABS 1.4 07/25/2017 1602   MONOABS 0.7 11/10/2020 1734   EOSABS 0.0 11/10/2020 1734   EOSABS 0.0 07/25/2017 1602   BASOSABS 0.0 11/10/2020 1734   BASOSABS 0.0 07/25/2017 1602    CMP     Component Value Date/Time   NA 138 11/10/2020 1734   NA 144 12/15/2017 1559   K 3.7 11/10/2020 1734   CL 105 11/10/2020 1734   CO2 23 11/10/2020 1734   GLUCOSE 84 11/10/2020 1734   BUN 25 (H) 11/10/2020 1734   BUN 16 12/15/2017 1559   CREATININE 1.01 (H) 11/10/2020 1734   CREATININE 0.49 (L) 01/29/2016 1606   CALCIUM 9.4 11/10/2020 1734   PROT 7.5 11/10/2020 1734   PROT 7.6 03/10/2018 1615   ALBUMIN 4.2 11/10/2020 1734   ALBUMIN 4.5 03/10/2018 1615   AST 23 11/10/2020 1734   ALT 16 11/10/2020 1734   ALKPHOS 45 11/10/2020 1734   BILITOT 0.4 11/10/2020 1734   BILITOT <0.2 03/10/2018 1615   GFRNONAA >60 11/10/2020 1734   GFRAA >60 01/16/2018 0524    Imaging I have reviewed the images obtaine  CT-scan of the brain-no acute intracranial abnormality CT C-spine-radiology reading possible C1 fracture. MR brain: Small focus of acute ischemia in the right frontal corona radiata near the base of the precentral gyrus with no hemorrhage or mass-effect. MRI of the C-spine: No acute abnormality, mild spinal canal stenosis at C3-4 and moderate left C5-6 neuroforaminal stenosis and mild bilateral C4-5 neuroforaminal stenosis.  Assessment: 65 year old with above past medical history presenting with nonspecific neurological complaints noted to have a small focus of acute ischemia in the right frontal corona radiata. Unclear etiology of the stroke at this time but requires further work-up. Has had chronic longstanding neck pain and cervicalgia issues for which she is on multiple medications as well as had a prolonged course of  depression for which she is also being treated.  Impression:  Acute ischemic stroke-etiology  under investigation History of coronary artery disease status post CABG   Recommendations: Admit to hospitalist Frequent neurochecks Telemetry Currently on aspirin 81-increase to aspirin 325. CT angio head and neck not possible due to allergy (throat swelling)- can get mRA head without contrast and carotid dopplers 2D echo A1c Lipid panel Atorvastatin 80 now and daily PT OT Speech therapy Further recommendations after the availability of test results D/w Dr Toniann Fail, admitting.  -- Milon Dikes, MD Neurologist [facility] Pager: [phone]

## 2020-11-11 NOTE — ED Provider Notes (Signed)
Transferred here from [facility] for MRI brain and cervical spine.  See prior notes for full H&P.  Briefly, 65 y.o. F here with several days of weakness, generally feeling unwell, and frequent falls.  CT head negative but felt to have C1 fracture on CT scan.    Plan:  MRI's pending.  Results for orders placed or performed during the hospital encounter of 11/10/20  Resp Panel by RT-PCR (Flu A&B, Covid) Nasopharyngeal Swab   Specimen: Nasopharyngeal Swab; Nasopharyngeal(NP) swabs in vial transport medium  Result Value Ref Range   SARS Coronavirus 2 by RT PCR NEGATIVE NEGATIVE   Influenza A by PCR NEGATIVE NEGATIVE   Influenza B by PCR NEGATIVE NEGATIVE  Comprehensive metabolic panel  Result Value Ref Range   Sodium 138 135 - 145 mmol/L   Potassium 3.7 3.5 - 5.1 mmol/L   Chloride 105 98 - 111 mmol/L   CO2 23 22 - 32 mmol/L   Glucose, Bld 84 70 - 99 mg/dL   BUN 25 (H) 8 - 23 mg/dL   Creatinine, Ser 3.36 (H) 0.44 - 1.00 mg/dL   Calcium 9.4 8.9 - 12.2 mg/dL   Total Protein 7.5 6.5 - 8.1 g/dL   Albumin 4.2 3.5 - 5.0 g/dL   AST 23 15 - 41 U/L   ALT 16 0 - 44 U/L   Alkaline Phosphatase 45 38 - 126 U/L   Total Bilirubin 0.4 0.3 - 1.2 mg/dL   GFR, Estimated >44 >97 mL/min   Anion gap 10 5 - 15  CBC with Differential  Result Value Ref Range   WBC 4.1 4.0 - 10.5 K/uL   RBC 3.63 (L) 3.87 - 5.11 MIL/uL   Hemoglobin 9.3 (L) 12.0 - 15.0 g/dL   HCT 53.0 (L) 05.1 - 10.2 %   MCV 78.8 (L) 80.0 - 100.0 fL   MCH 25.6 (L) 26.0 - 34.0 pg   MCHC 32.5 30.0 - 36.0 g/dL   RDW 11.1 73.5 - 67.0 %   Platelets 162 150 - 400 K/uL   nRBC 0.0 0.0 - 0.2 %   Neutrophils Relative % 45 %   Neutro Abs 1.9 1.7 - 7.7 K/uL   Lymphocytes Relative 37 %   Lymphs Abs 1.5 0.7 - 4.0 K/uL   Monocytes Relative 16 %   Monocytes Absolute 0.7 0.1 - 1.0 K/uL   Eosinophils Relative 1 %   Eosinophils Absolute 0.0 0.0 - 0.5 K/uL   Basophils Relative 0 %   Basophils Absolute 0.0 0.0 - 0.1 K/uL   Immature Granulocytes 1 %   Abs  Immature Granulocytes 0.02 0.00 - 0.07 K/uL  Magnesium  Result Value Ref Range   Magnesium 1.7 1.7 - 2.4 mg/dL  Ethanol  Result Value Ref Range   Alcohol, Ethyl (B) <10 <10 mg/dL  POC CBG, ED  Result Value Ref Range   Glucose-Capillary 85 70 - 99 mg/dL   DG Chest 2 View  Result Date: 11/10/2020 CLINICAL DATA:  Weakness and unsteady gait. EXAM: CHEST - 2 VIEW COMPARISON:  02/20/2018 FINDINGS: Postoperative changes in the mediastinum. Heart size and pulmonary vascularity are normal. Lungs are clear. No pleural effusions. No pneumothorax. Mediastinal contours appear intact. IMPRESSION: No active cardiopulmonary disease. Electronically Signed   By: Burman Nieves M.D.   On: 11/10/2020 18:04   CT Head Wo Contrast  Result Date: 11/10/2020 CLINICAL DATA:  Generalized weakness for 1 week. Shaky feeling with unsteady gait. Headache. Mental status change. EXAM: CT HEAD WITHOUT CONTRAST TECHNIQUE: Contiguous axial  images were obtained from the base of the skull through the vertex without intravenous contrast. COMPARISON:  04/19/2008 FINDINGS: Brain: No evidence of acute infarction, hemorrhage, hydrocephalus, extra-axial collection or mass lesion/mass effect. Mild diffuse cerebral atrophy. Patchy low-attenuation changes in the deep white matter consistent with small vessel ischemic changes. Vascular: Moderate intracranial arterial vascular calcifications. Skull: Calvarium appears intact. Sinuses/Orbits: Paranasal sinuses and mastoid air cells are clear. Other: None. IMPRESSION: No acute intracranial abnormalities. Mild chronic atrophy and small vessel ischemic changes. Electronically Signed   By: Burman Nieves M.D.   On: 11/10/2020 18:04   CT Cervical Spine Wo Contrast  Result Date: 11/10/2020 CLINICAL DATA:  Neck trauma EXAM: CT CERVICAL SPINE WITHOUT CONTRAST TECHNIQUE: Multidetector CT imaging of the cervical spine was performed without intravenous contrast. Multiplanar CT image reconstructions were  also generated. COMPARISON:  X-ray 09/17/2016 FINDINGS: Alignment: Facet joints are aligned without dislocation or traumatic listhesis. Dens and lateral masses are aligned. Skull base and vertebrae: Minimally displaced fracture involving the posterior arch of C1 on the left (series 3, image 26). Fracture margins do not appear sclerotic. No additional fractures. No lytic or sclerotic bone lesion identified. Soft tissues and spinal canal: No prevertebral fluid or swelling. No visible canal hematoma. Disc levels: Minimal disc height loss at C5-6. No significant facet arthropathy. Upper chest: Included lung apices are clear. Other: Bilateral carotid and vertebral artery atherosclerosis. IMPRESSION: Acute-appearing minimally displaced fracture involving the posterior arch of C1 on the left. Recommend cervical spine MRI to evaluate for any potential ligamentous injury. These results were called by telephone at the time of interpretation on 11/10/2020 at 7:45 pm to provider [facility] , who verbally acknowledged these results. Electronically Signed   By: Duanne Guess D.O.   On: 11/10/2020 19:45   MR Brain W and Wo Contrast  Result Date: 11/11/2020 CLINICAL DATA:  Tremors and weakness EXAM: MRI HEAD WITHOUT AND WITH CONTRAST TECHNIQUE: Multiplanar, multiecho pulse sequences of the brain and surrounding structures were obtained without and with intravenous contrast. CONTRAST:  25mL GADAVIST GADOBUTROL 1 MMOL/ML IV SOLN COMPARISON:  None. FINDINGS: Brain: Small focus of abnormal diffusion restriction within the right frontal corona radiata, near the base of the precentral gyrus. Fewer than 5 scattered microhemorrhages in a nonspecific pattern. There is multifocal hyperintense T2-weighted signal within the white matter. Parenchymal volume and CSF spaces are normal. The midline structures are normal. No abnormal contrast enhancement. Vascular: Major flow voids are preserved. Skull and upper cervical spine: Normal  calvarium and skull base. Visualized upper cervical spine and soft tissues are normal. Sinuses/Orbits:No paranasal sinus fluid levels or advanced mucosal thickening. No mastoid or middle ear effusion. Normal orbits. IMPRESSION: 1. Small focus of acute ischemia within the right frontal corona radiata, near the base of the precentral gyrus. No hemorrhage or mass effect. 2. Findings of chronic microvascular ischemia. Electronically Signed   By: Deatra Robinson M.D.   On: 11/11/2020 00:01   MR Cervical Spine W or Wo Contrast  Result Date: 11/11/2020 CLINICAL DATA:  Weakness and tremors EXAM: MRI CERVICAL SPINE WITHOUT AND WITH CONTRAST TECHNIQUE: Multiplanar and multiecho pulse sequences of the cervical spine, to include the craniocervical junction and cervicothoracic junction, were obtained without and with intravenous contrast. CONTRAST:  104mL GADAVIST GADOBUTROL 1 MMOL/ML IV SOLN COMPARISON:  None. FINDINGS: Alignment: Normal Vertebrae: Mild nonspecific heterogeneity of the bone marrow signal. No focal abnormality. Cord: Normal Posterior Fossa, vertebral arteries, paraspinal tissues: Negative Disc levels: C1-C2: Normal. C2-C3: Normal disc space and  facets. No spinal canal or neuroforaminal stenosis. C3-C4: Small disc bulge. Mild spinal canal narrowing. No foraminal stenosis. C4-C5: Small disc bulge with uncovertebral spurring. Mild bilateral foraminal stenosis. No spinal canal stenosis. C5-C6: Small left asymmetric disc bulge. No spinal canal stenosis. Moderate left foraminal stenosis. C6-C7: No disc herniation or stenosis. C7-T1: Normal disc space and facets. No spinal canal or neuroforaminal stenosis. IMPRESSION: 1. No acute abnormality of the cervical spine. 2. Mild spinal canal stenosis at C3-C4 secondary to small disc bulge. 3. Moderate left C5-C6 neural foraminal stenosis. 4. Mild bilateral C4-C5 neural foraminal stenosis. Electronically Signed   By: Deatra Robinson M.D.   On: 11/11/2020 00:04    MRI's here  without noted C1 fracture, but does have acute ischemic area in right frontal corona radiata.  Discussed with neurology, Dr. Elon Spanner-- will see in consult, 325 ASA for now, medicine admit.  Discussed with Dr. Toniann Fail-- will admit for ongoing care.   Garlon Hatchet, PA-C 11/11/20 Lajoyce Lauber, MD 11/11/20 [phone]

## 2020-11-11 NOTE — ED Notes (Addendum)
Attending notified for patient and family request for an update and results. Attending to present to the bedside momentarily.

## 2020-11-11 NOTE — Progress Notes (Signed)
STROKE TEAM PROGRESS NOTE   SUBJECTIVE (INTERVAL HISTORY) Her mom is at the bedside.  Overall her condition is stable. Still feels whole body weakness and lethargic. No significant focal deficit. MRI showed right frontal punctate to small infarct. PT/OT pending.   OBJECTIVE Temp:  [97.5 F (36.4 C)-98.2 F (36.8 C)] 98.1 F (36.7 C) (06/14 0954) Pulse Rate:  [65-73] 69 (06/14 1300) Resp:  [15-23] 17 (06/14 1300) BP: (111-175)/(70-107) 133/78 (06/14 1300) SpO2:  [95 %-100 %] 100 % (06/14 1300) Weight:  [68 kg] 68 kg (06/13 1629)  Recent Labs  Lab 11/10/20 1638  GLUCAP 85   Recent Labs  Lab 11/10/20 1734 11/11/20 0416  NA 138  --   K 3.7  --   CL 105  --   CO2 23  --   GLUCOSE 84  --   BUN 25*  --   CREATININE 1.01* 0.90  CALCIUM 9.4  --   MG 1.7  --    Recent Labs  Lab 11/10/20 1734  AST 23  ALT 16  ALKPHOS 45  BILITOT 0.4  PROT 7.5  ALBUMIN 4.2   Recent Labs  Lab 11/10/20 1734 11/11/20 0416  WBC 4.1 3.3*  NEUTROABS 1.9  --   HGB 9.3* 8.8*  HCT 28.6* 28.3*  MCV 78.8* 81.3  PLT 162 165   No results for input(s): CKTOTAL, CKMB, CKMBINDEX, TROPONINI in the last 168 hours. No results for input(s): LABPROT, INR in the last 72 hours. No results for input(s): COLORURINE, LABSPEC, PHURINE, GLUCOSEU, HGBUR, BILIRUBINUR, KETONESUR, PROTEINUR, UROBILINOGEN, NITRITE, LEUKOCYTESUR in the last 72 hours.  Invalid input(s): APPERANCEUR     Component Value Date/Time   CHOL 123 11/11/2020 0416   CHOL 128 03/10/2018 1615   TRIG 72 11/11/2020 0416   HDL 41 11/11/2020 0416   HDL 44 03/10/2018 1615   CHOLHDL 3.0 11/11/2020 0416   VLDL 14 11/11/2020 0416   LDLCALC 68 11/11/2020 0416   LDLCALC 50 03/10/2018 1615   Lab Results  Component Value Date   HGBA1C 5.6 01/09/2018   No results found for: LABOPIA, COCAINSCRNUR, LABBENZ, AMPHETMU, THCU, LABBARB  Recent Labs  Lab 11/10/20 1734  ETH <10    I have personally reviewed the radiological images below and  agree with the radiology interpretations.  DG Chest 2 View  Result Date: 11/10/2020 CLINICAL DATA:  Weakness and unsteady gait. EXAM: CHEST - 2 VIEW COMPARISON:  02/20/2018 FINDINGS: Postoperative changes in the mediastinum. Heart size and pulmonary vascularity are normal. Lungs are clear. No pleural effusions. No pneumothorax. Mediastinal contours appear intact. IMPRESSION: No active cardiopulmonary disease. Electronically Signed   By: Burman Nieves M.D.   On: 11/10/2020 18:04   CT Head Wo Contrast  Result Date: 11/10/2020 CLINICAL DATA:  Generalized weakness for 1 week. Shaky feeling with unsteady gait. Headache. Mental status change. EXAM: CT HEAD WITHOUT CONTRAST TECHNIQUE: Contiguous axial images were obtained from the base of the skull through the vertex without intravenous contrast. COMPARISON:  04/19/2008 FINDINGS: Brain: No evidence of acute infarction, hemorrhage, hydrocephalus, extra-axial collection or mass lesion/mass effect. Mild diffuse cerebral atrophy. Patchy low-attenuation changes in the deep white matter consistent with small vessel ischemic changes. Vascular: Moderate intracranial arterial vascular calcifications. Skull: Calvarium appears intact. Sinuses/Orbits: Paranasal sinuses and mastoid air cells are clear. Other: None. IMPRESSION: No acute intracranial abnormalities. Mild chronic atrophy and small vessel ischemic changes. Electronically Signed   By: Burman Nieves M.D.   On: 11/10/2020 18:04   CT Cervical Spine  Wo Contrast  Result Date: 11/10/2020 CLINICAL DATA:  Neck trauma EXAM: CT CERVICAL SPINE WITHOUT CONTRAST TECHNIQUE: Multidetector CT imaging of the cervical spine was performed without intravenous contrast. Multiplanar CT image reconstructions were also generated. COMPARISON:  X-ray 09/17/2016 FINDINGS: Alignment: Facet joints are aligned without dislocation or traumatic listhesis. Dens and lateral masses are aligned. Skull base and vertebrae: Minimally displaced  fracture involving the posterior arch of C1 on the left (series 3, image 26). Fracture margins do not appear sclerotic. No additional fractures. No lytic or sclerotic bone lesion identified. Soft tissues and spinal canal: No prevertebral fluid or swelling. No visible canal hematoma. Disc levels: Minimal disc height loss at C5-6. No significant facet arthropathy. Upper chest: Included lung apices are clear. Other: Bilateral carotid and vertebral artery atherosclerosis. IMPRESSION: Acute-appearing minimally displaced fracture involving the posterior arch of C1 on the left. Recommend cervical spine MRI to evaluate for any potential ligamentous injury. These results were called by telephone at the time of interpretation on 11/10/2020 at 7:45 pm to provider [facility] , who verbally acknowledged these results. Electronically Signed   By: Duanne Guess D.O.   On: 11/10/2020 19:45   MR ANGIO HEAD WO CONTRAST  Result Date: 11/11/2020 CLINICAL DATA:  Stroke follow-up. Small acute white matter infarct in the posterior right frontal lobe on MRI yesterday. EXAM: MRA HEAD WITHOUT CONTRAST TECHNIQUE: Angiographic images of the Circle of Willis were acquired using MRA technique without intravenous contrast. COMPARISON:  No pertinent prior exam. FINDINGS: Anterior circulation: The internal carotid arteries are widely patent from skull base to carotid termini. ACAs and MCAs are patent without evidence of a proximal branch occlusion or significant proximal stenosis. No aneurysm is identified. Posterior circulation: The visualized distal vertebral arteries are widely patent to the basilar and codominant. Patent right PICA, bilateral AICA, and bilateral SCA origins are identified. The basilar artery is widely patent. There are large posterior communicating arteries bilaterally. The left P1 segment is hypoplastic. Both PCAs are patent without evidence of a significant proximal stenosis. No aneurysm is identified. Anatomic  variants: Fetal left PCA. IMPRESSION: Negative head MRA. Electronically Signed   By: Sebastian Ache M.D.   On: 11/11/2020 09:46   MR Brain W and Wo Contrast  Result Date: 11/11/2020 CLINICAL DATA:  Tremors and weakness EXAM: MRI HEAD WITHOUT AND WITH CONTRAST TECHNIQUE: Multiplanar, multiecho pulse sequences of the brain and surrounding structures were obtained without and with intravenous contrast. CONTRAST:  28mL GADAVIST GADOBUTROL 1 MMOL/ML IV SOLN COMPARISON:  None. FINDINGS: Brain: Small focus of abnormal diffusion restriction within the right frontal corona radiata, near the base of the precentral gyrus. Fewer than 5 scattered microhemorrhages in a nonspecific pattern. There is multifocal hyperintense T2-weighted signal within the white matter. Parenchymal volume and CSF spaces are normal. The midline structures are normal. No abnormal contrast enhancement. Vascular: Major flow voids are preserved. Skull and upper cervical spine: Normal calvarium and skull base. Visualized upper cervical spine and soft tissues are normal. Sinuses/Orbits:No paranasal sinus fluid levels or advanced mucosal thickening. No mastoid or middle ear effusion. Normal orbits. IMPRESSION: 1. Small focus of acute ischemia within the right frontal corona radiata, near the base of the precentral gyrus. No hemorrhage or mass effect. 2. Findings of chronic microvascular ischemia. Electronically Signed   By: Deatra Robinson M.D.   On: 11/11/2020 00:01   MR Cervical Spine W or Wo Contrast  Result Date: 11/11/2020 CLINICAL DATA:  Weakness and tremors EXAM: MRI CERVICAL SPINE WITHOUT AND  WITH CONTRAST TECHNIQUE: Multiplanar and multiecho pulse sequences of the cervical spine, to include the craniocervical junction and cervicothoracic junction, were obtained without and with intravenous contrast. CONTRAST:  7mL GADAVIST GADOBUTROL 1 MMOL/ML IV SOLN COMPARISON:  None. FINDINGS: Alignment: Normal Vertebrae: Mild nonspecific heterogeneity of the  bone marrow signal. No focal abnormality. Cord: Normal Posterior Fossa, vertebral arteries, paraspinal tissues: Negative Disc levels: C1-C2: Normal. C2-C3: Normal disc space and facets. No spinal canal or neuroforaminal stenosis. C3-C4: Small disc bulge. Mild spinal canal narrowing. No foraminal stenosis. C4-C5: Small disc bulge with uncovertebral spurring. Mild bilateral foraminal stenosis. No spinal canal stenosis. C5-C6: Small left asymmetric disc bulge. No spinal canal stenosis. Moderate left foraminal stenosis. C6-C7: No disc herniation or stenosis. C7-T1: Normal disc space and facets. No spinal canal or neuroforaminal stenosis. IMPRESSION: 1. No acute abnormality of the cervical spine. 2. Mild spinal canal stenosis at C3-C4 secondary to small disc bulge. 3. Moderate left C5-C6 neural foraminal stenosis. 4. Mild bilateral C4-C5 neural foraminal stenosis. Electronically Signed   By: Deatra Robinson M.D.   On: 11/11/2020 00:04   ECHOCARDIOGRAM COMPLETE  Result Date: 11/11/2020    ECHOCARDIOGRAM REPORT   Patient Name:   Diana [name] Date of Exam: 11/11/2020 Medical Rec #:  [id number]      Height:       62.0 in Accession #:    [id number]     Weight:       150.0 lb Date of Birth:  September 02, 1955       BSA:          1.692 m Patient Age:    64 years       BP:           111/70 mmHg Patient Gender: F              HR:           70 bpm. Exam Location:  Inpatient Procedure: 2D Echo, Color Doppler and Cardiac Doppler Indications:    Stroke  History:        Patient has prior history of Echocardiogram examinations, most                 recent 03/15/2019. Previous Myocardial Infarction and CAD; Risk                 Factors:Hypertension and Dyslipidemia.  Sonographer:    Eulah Pont RDCS Referring Phys: [phone] Meryle Ready [facility] IMPRESSIONS  1. Left ventricular ejection fraction, by estimation, is 60 to 65%. The left ventricle has normal function. The left ventricle has no regional wall motion abnormalities. Left ventricular  diastolic parameters were normal.  2. Right ventricular systolic function is normal. The right ventricular size is normal. There is normal pulmonary artery systolic pressure.  3. The mitral valve is normal in structure. Mild mitral valve regurgitation. No evidence of mitral stenosis.  4. The aortic valve is tricuspid. Aortic valve regurgitation is trivial. No aortic stenosis is present.  5. The inferior vena cava is normal in size with greater than 50% respiratory variability, suggesting right atrial pressure of 3 mmHg. FINDINGS  Left Ventricle: Left ventricular ejection fraction, by estimation, is 60 to 65%. The left ventricle has normal function. The left ventricle has no regional wall motion abnormalities. The left ventricular internal cavity size was normal in size. There is  no left ventricular hypertrophy. Left ventricular diastolic parameters were normal. Right Ventricle: The right ventricular size is normal. Right ventricular systolic function is normal.  There is normal pulmonary artery systolic pressure. The tricuspid regurgitant velocity is 2.26 m/s, and with an assumed right atrial pressure of 3 mmHg,  the estimated right ventricular systolic pressure is 23.4 mmHg. Left Atrium: Left atrial size was normal in size. Right Atrium: Right atrial size was normal in size. Pericardium: There is no evidence of pericardial effusion. Mitral Valve: The mitral valve is normal in structure. Mild mitral valve regurgitation. No evidence of mitral valve stenosis. Tricuspid Valve: The tricuspid valve is normal in structure. Tricuspid valve regurgitation is mild . No evidence of tricuspid stenosis. Aortic Valve: The aortic valve is tricuspid. Aortic valve regurgitation is trivial. No aortic stenosis is present. Pulmonic Valve: The pulmonic valve was not well visualized. Pulmonic valve regurgitation is mild. No evidence of pulmonic stenosis. Aorta: The aortic root is normal in size and structure. Venous: The inferior vena  cava is normal in size with greater than 50% respiratory variability, suggesting right atrial pressure of 3 mmHg. IAS/Shunts: No atrial level shunt detected by color flow Doppler.  LEFT VENTRICLE PLAX 2D LVIDd:         3.60 cm  Diastology LVIDs:         2.50 cm  LV e' medial:    9.11 cm/s LV PW:         0.90 cm  LV E/e' medial:  9.0 LV IVS:        1.20 cm  LV e' lateral:   11.50 cm/s LVOT diam:     2.10 cm  LV E/e' lateral: 7.1 LV SV:         57 LV SV Index:   34 LVOT Area:     3.46 cm  RIGHT VENTRICLE RV S prime:     10.30 cm/s TAPSE (M-mode): 1.7 cm LEFT ATRIUM             Index       RIGHT ATRIUM           Index LA diam:        2.40 cm 1.42 cm/m  RA Area:     12.80 cm LA Vol (A2C):   46.9 ml 27.72 ml/m RA Volume:   29.70 ml  17.56 ml/m LA Vol (A4C):   41.0 ml 24.24 ml/m LA Biplane Vol: 44.1 ml 26.07 ml/m  AORTIC VALVE LVOT Vmax:   88.90 cm/s LVOT Vmean:  61.500 cm/s LVOT VTI:    0.166 m  AORTA Ao Root diam: 2.90 cm Ao Asc diam:  3.20 cm MITRAL VALVE               TRICUSPID VALVE MV Area (PHT): 3.65 cm    TR Peak grad:   20.4 mmHg MV Decel Time: 208 msec    TR Vmax:        226.00 cm/s MV E velocity: 81.80 cm/s MV A velocity: 93.40 cm/s  SHUNTS MV E/A ratio:  0.88        Systemic VTI:  0.17 m                            Systemic Diam: 2.10 cm Olga Millers MD Electronically signed by Olga Millers MD Signature Date/Time: 11/11/2020/1:01:00 PM    Final    VAS US CAROTID  Result Date: 11/11/2020 Carotid Arterial Duplex Study Patient Name:  Diana [name]  Date of Exam:   11/11/2020 Medical Rec #: [id number]       Accession #:  [id number] Date of Birth: 05/21/1956        Patient Gender: F Patient Age:   64Y [facility] Procedure:      VAS US CAROTID Referring Phys: [id number] Marvel PlanJINDONG Dornell Grasmick --------------------------------------------------------------------------------  Indications:  CVA. Risk Factors: Hypertension, hyperlipidemia, coronary artery disease. Performing Technologist: [place] PonderMegan  Stricklin RVS  Examination Guidelines: A complete evaluation includes B-mode imaging, spectral Doppler, color Doppler, and power Doppler as needed of all accessible portions of each vessel. Bilateral testing is considered an integral part of a complete examination. Limited examinations for reoccurring indications may be performed as noted.  Right Carotid Findings: +----------+--------+--------+--------+------------------+--------+           PSV cm/sEDV cm/sStenosisPlaque DescriptionComments +----------+--------+--------+--------+------------------+--------+ CCA Prox  75      16              heterogenous               +----------+--------+--------+--------+------------------+--------+ CCA Distal67      16              heterogenous               +----------+--------+--------+--------+------------------+--------+ ICA Prox  61      21      1-39%   heterogenous               +----------+--------+--------+--------+------------------+--------+ ICA Distal58      19                                         +----------+--------+--------+--------+------------------+--------+ ECA       83                                                 +----------+--------+--------+--------+------------------+--------+ +----------+--------+-------+--------+-------------------+           PSV cm/sEDV cmsDescribeArm Pressure (mmHG) +----------+--------+-------+--------+-------------------+ [id number]                                         +----------+--------+-------+--------+-------------------+ +---------+--------+--+--------+--+---------+ VertebralPSV cm/s51EDV cm/s12Antegrade +---------+--------+--+--------+--+---------+  Left Carotid Findings: +----------+--------+--------+--------+------------------+--------+           PSV cm/sEDV cm/sStenosisPlaque DescriptionComments +----------+--------+--------+--------+------------------+--------+ CCA Prox  62      16               heterogenous               +----------+--------+--------+--------+------------------+--------+ CCA Distal77      25              heterogenous               +----------+--------+--------+--------+------------------+--------+ ICA Prox  63      18      1-39%   heterogenous               +----------+--------+--------+--------+------------------+--------+ ICA Distal79      29                                         +----------+--------+--------+--------+------------------+--------+ ECA       103     11                                         +----------+--------+--------+--------+------------------+--------+ +----------+--------+--------+--------+-------------------+  PSV cm/sEDV cm/sDescribeArm Pressure (mmHG) +----------+--------+--------+--------+-------------------+ [id number]                                          +----------+--------+--------+--------+-------------------+ +---------+--------+--------+---------------+ VertebralPSV cm/sEDV cm/sBi- directional +---------+--------+--------+---------------+   Summary: Right Carotid: Velocities in the right ICA are consistent with a 1-39% stenosis. Left Carotid: Velocities in the left ICA are consistent with a 1-39% stenosis. Vertebrals: Right vertebral artery demonstrates antegrade flow. Left vertebral             artery demonstrates bidirectional flow. *See table(s) above for measurements and observations.     Preliminary       PHYSICAL EXAM  Temp:  [97.5 F (36.4 C)-98.2 F (36.8 C)] 98.1 F (36.7 C) (06/14 0954) Pulse Rate:  [65-73] 69 (06/14 1300) Resp:  [15-23] 17 (06/14 1300) BP: (111-175)/(70-107) 133/78 (06/14 1300) SpO2:  [95 %-100 %] 100 % (06/14 1300) Weight:  [68 kg] 68 kg (06/13 1629)  General - Well nourished, well developed, in no apparent distress, but lethargic.  Ophthalmologic - fundi not visualized due to noncooperation.  Cardiovascular - Regular rhythm and  rate.  Mental Status -  Level of arousal and orientation to time, place, and person were intact. Language including expression, naming, repetition, comprehension was assessed and found intact. Fund of Knowledge was assessed and was intact.  Cranial Nerves II - XII - II - Visual field intact OU. III, IV, VI - Extraocular movements intact. V - Facial sensation intact bilaterally. VII - Facial movement intact bilaterally. VIII - Hearing & vestibular intact bilaterally. X - Palate elevates symmetrically. XI - Chin turning & shoulder shrug intact bilaterally. XII - Tongue protrusion intact.  Motor Strength - The patient's strength was normal in all extremities and pronator drift was absent except mild giveaway weakness on the LLE.  Bulk was normal and fasciculations were absent.   Motor Tone - Muscle tone was assessed at the neck and appendages and was normal.  Reflexes - The patient's reflexes were symmetrical in all extremities and she had no pathological reflexes.  Sensory - Light touch, temperature/pinprick were assessed and were symmetrical except chronic right LE medial calf decreased light touch sensation due to saphenus vein harvest for CABG.    Coordination - The patient had normal movements in the right hand and foot with no ataxia or dysmetria. Slight dysmetria on L FTN and HTS. Tremor was absent.  Gait and Station - deferred.   ASSESSMENT/PLAN Diana [name] is a 65 y.o. female with history of CAD status post CABG in 2019, hypertension, hyperlipidemia, depression admitted for lightheadedness, generalized weakness, difficulty walking, slurred speech and several falls at home. No tPA given due to outside window.    Stroke, ? incidental:  right frontal CR small infarct, likely small vessel disease Resultant lethargy and generalize weakness with left giveaway weakness MRI right frontal corona radiata small infarct MRA negative Carotid Doppler unremarkable 2D Echo EF 60 to  65% LDL 68 HgbA1c pending Lovenox for VTE prophylaxis aspirin 81 mg daily prior to admission, now on aspirin 81 mg daily and clopidogrel 75 mg daily DAPT for 3 weeks and then Plavix alone. Patient counseled to be compliant with her antithrombotic medications Ongoing aggressive stroke risk factor management Therapy recommendations: Outpatient PT Disposition: Home today  Hypertension Stable Long term BP goal normotensive  Hyperlipidemia Home meds: Lipitor 40 LDL 68, goal < 70 Now  on Lipitor 40 Continue statin at discharge  Other Stroke Risk Factors Coronary artery disease status post CABG  Other Active Problems Depression  Hospital day # 0  Neurology will sign off. Please call with questions. Pt will follow up with stroke clinic NP at [facility] in about 4 weeks. Thanks for the consult.   Marvel Plan, MD PhD Stroke Neurology 11/11/2020 2:29 PM    To contact Stroke Continuity provider, please refer to [email or web address]. After hours, contact General Neurology

## 2020-11-11 NOTE — Discharge Summary (Signed)
Physician Discharge Summary  Diana [name] EZM:[id number] DOB: 08-05-1955 DOA: 11/10/2020  PCP: Porfirio Oar, PA  Admit date: 11/10/2020 Discharge date: 11/11/2020  Time spent: 47 minutes  Recommendations for Outpatient Follow-up:  Will need labs a CBC Chem-12 in about 1 week Please work in outpatient setting possible pancytopenia-she will need repeat labs as above-if this recurs would recommend platelet smear and scans Recommend outpatient follow-up with cardiology may require outpatient work-up with neurology   Discharge Diagnoses:  MAIN problem for hospitalization   Headache dizzy discomfort  Small corona radiata stroke ruled in   Please see below for itemized issues addressed in [facility]- refer to other progress notes for clarity if needed  Discharge Condition: Improved  Diet recommendation: Heart healthy  Filed Weights   11/10/20 1629  Weight: 68 kg    History of present illness:  54 black female CABG X4 12/2017-prior NSTEMI?,  HTN, reflux, IDA, Chronic thoracic spine pain secondary to fall with neck trauma-chronic cervicalgia Depression CVA 2020?-(Patient allergic to contrast-cannot get CTA)  Presents 11/10/2020 [facility] weakness X 1 week feeling shaky Also severe headache  patient was assessed at bedside by me and the neurologist--she tells me that she has been getting outpatient cardiac rehab and has been working with them for the past several weeks but has had this feeling of "shakiness" for the past several weeks She also has some back pain in her upper back and shoulder but is able to mobilize fairly well She reports in addition some tremulousness but no seizure  CT scan = ??C1 fracture-acute,  MRI did not confirm any fracture of the cervical spine  Neurology/neurosurgery Dr. Danielle Dess consulted--he has verbalized that patient can have mobility as tolerated given no confirmation of acute fracture  MRI brain = small infarction right corona radiata  precentral gyru  Pertinent labs and imaging WBC 3, hemoglobin 8, platelet 165 [patient does not have any known thrombocytopenia]-iron studies 2021 normal with saturation ratios 27  BUNs/creatinine baseline 10/0.6-25/1.01 EKG my over read NSR no ST-T wave changes  Echocardiogram = EF 60-65% normal pulmonary arterial pressures Carotid duplex = right carotid and left carotid 1 to 39% stenosis MRI brain = negative head MRI for stroke-fetal left PCA  Consultations: Neurology  Discharge Exam: Vitals:   11/11/20 1300 11/11/20 1330  BP: 133/78 134/75  Pulse: 69 66  Resp: 17 18  Temp:    SpO2: 100% 99%    Subj on day of d/c   Seen and examined at the bedside stable in no distress complaining of shoulder pain which is chronic (46-month history) She is able to sit up in the bed she has a very flat affect however and her mother speaks for her predominantly No chest pain, - fever, - chills, - nausea, - vomiting, - diarrhea, - melena, - emesis, - dysuria  General Exam on discharge  Awake coherent pleasant Good prosody Finger-nose-finger intact No dysdiadochokinesia S1-S2 no murmur CTA B no added sound no rales no rhonchi Vision by direct confrontation is intact Power is 5/5 Reflexes are somewhat brisk in brachial and knee I do not think she has clonus Smile is symmetric poor dentition No JVD Skin is soft nontender Musculoskeletal wise she is able to flex extend at the shoulder and empty can testing is negative ROM is intact to both lower extremities and power right hip and knee are intact although she is somewhat tremulous in her left side She does have decrease in sensation on the right side of  her shoulder compared to the left cold touch  Discharge Instructions   Discharge Instructions     Diet - low sodium heart healthy   Complete by: As directed    Increase activity slowly   Complete by: As directed    Increase activity slowly   Complete by: As directed        Allergies as of 11/11/2020       Reactions   Contrast Media [iodinated Diagnostic Agents] Anaphylaxis   Had CT scan 12/14/17, had trouble breathing, throat swelled   Penicillins Rash, Other (See Comments)   PATIENT HAS HAD A PCN REACTION WITH IMMEDIATE RASH, FACIAL/TONGUE/THROAT SWELLING, SOB, OR LIGHTHEADEDNESS WITH HYPOTENSION:  #  #  YES  #  #  Has patient had a PCN reaction causing severe rash involving mucus membranes or skin necrosis:  no Has patient had a PCN reaction that required hospitalization:  no Has patient had a PCN reaction occurring within the last 10 years: no   Methocarbamol Nausea Only, Other (See Comments)   headache   Codeine Anxiety   Dizziness and headache        Medication List     STOP taking these medications    ALPRAZolam 0.5 MG tablet Commonly known as: XANAX   aspirin EC 81 MG tablet Replaced by: aspirin 325 MG tablet   losartan-hydrochlorothiazide 50-12.5 MG tablet Commonly known as: HYZAAR   meloxicam 15 MG tablet Commonly known as: MOBIC       TAKE these medications    acetaminophen 500 MG tablet Commonly known as: TYLENOL Take 500 mg by mouth every 6 (six) hours as needed for headache.   aspirin 325 MG tablet Take 1 tablet (325 mg total) by mouth daily. Start taking on: November 12, 2020 Replaces: aspirin EC 81 MG tablet   atorvastatin 40 MG tablet Commonly known as: LIPITOR Take 40 mg by mouth daily. What changed: Another medication with the same name was removed. Continue taking this medication, and follow the directions you see here.   cetirizine 10 MG tablet Commonly known as: ZYRTEC Take 10 mg by mouth daily.   DULoxetine 60 MG capsule Commonly known as: CYMBALTA Take 60 mg by mouth daily.   ferrous gluconate 324 MG tablet Commonly known as: FERGON Take 2 tablets by mouth daily.   hydrOXYzine 10 MG tablet Commonly known as: ATARAX/VISTARIL Take 10 mg by mouth 3 (three) times daily as needed for anxiety.    Metoprolol Tartrate 75 MG Tabs Take 75 mg by mouth every 12 (twelve) hours.   montelukast 10 MG tablet Commonly known as: SINGULAIR Take 10 mg by mouth at bedtime.   Archivist as directed to reduce muscle/joint pain   naproxen 500 MG tablet Commonly known as: NAPROSYN Take 1 tablet (500 mg total) by mouth 2 (two) times daily with a meal.   nitroGLYCERIN 0.4 MG SL tablet Commonly known as: NITROSTAT Place 1 tablet (0.4 mg total) under the tongue every 5 (five) minutes as needed for chest pain.   omeprazole 20 MG capsule Commonly known as: PRILOSEC Take 1 capsule (20 mg total) by mouth daily.   polyethylene glycol powder 17 GM/SCOOP powder Commonly known as: GLYCOLAX/MIRALAX Take 17 g by mouth every evening.   pregabalin 150 MG capsule Commonly known as: LYRICA Take 150 mg by mouth 2 (two) times daily.   ranolazine 500 MG 12 hr tablet Commonly known as: RANEXA Take 1,000 mg by mouth 2 (two) times daily.   traZODone  50 MG tablet Commonly known as: DESYREL Take 25-50 mg by mouth at bedtime as needed for sleep.   vitamin C 500 MG tablet Commonly known as: ASCORBIC ACID Take 500 mg by mouth daily.   vitamin D3 50 MCG (2000 UT) Caps Take 2,000 Units by mouth daily.       Allergies  Allergen Reactions   Contrast Media [Iodinated Diagnostic Agents] Anaphylaxis    Had CT scan 12/14/17, had trouble breathing, throat swelled   Penicillins Rash and Other (See Comments)    PATIENT HAS HAD A PCN REACTION WITH IMMEDIATE RASH, FACIAL/TONGUE/THROAT SWELLING, SOB, OR LIGHTHEADEDNESS WITH HYPOTENSION:  #  #  YES  #  #  Has patient had a PCN reaction causing severe rash involving mucus membranes or skin necrosis:  no Has patient had a PCN reaction that required hospitalization:  no Has patient had a PCN reaction occurring within the last 10 years: no    Methocarbamol Nausea Only and Other (See Comments)    headache   Codeine Anxiety    Dizziness and  headache      The results of significant diagnostics from this hospitalization (including imaging, microbiology, ancillary and laboratory) are listed below for reference.    Significant Diagnostic Studies: DG Chest 2 View  Result Date: 11/10/2020 CLINICAL DATA:  Weakness and unsteady gait. EXAM: CHEST - 2 VIEW COMPARISON:  02/20/2018 FINDINGS: Postoperative changes in the mediastinum. Heart size and pulmonary vascularity are normal. Lungs are clear. No pleural effusions. No pneumothorax. Mediastinal contours appear intact. IMPRESSION: No active cardiopulmonary disease. Electronically Signed   By: Burman NievesWilliam  Stevens M.D.   On: 11/10/2020 18:04   CT Head Wo Contrast  Result Date: 11/10/2020 CLINICAL DATA:  Generalized weakness for 1 week. Shaky feeling with unsteady gait. Headache. Mental status change. EXAM: CT HEAD WITHOUT CONTRAST TECHNIQUE: Contiguous axial images were obtained from the base of the skull through the vertex without intravenous contrast. COMPARISON:  04/19/2008 FINDINGS: Brain: No evidence of acute infarction, hemorrhage, hydrocephalus, extra-axial collection or mass lesion/mass effect. Mild diffuse cerebral atrophy. Patchy low-attenuation changes in the deep white matter consistent with small vessel ischemic changes. Vascular: Moderate intracranial arterial vascular calcifications. Skull: Calvarium appears intact. Sinuses/Orbits: Paranasal sinuses and mastoid air cells are clear. Other: None. IMPRESSION: No acute intracranial abnormalities. Mild chronic atrophy and small vessel ischemic changes. Electronically Signed   By: Burman NievesWilliam  Stevens M.D.   On: 11/10/2020 18:04   CT Cervical Spine Wo Contrast  Result Date: 11/10/2020 CLINICAL DATA:  Neck trauma EXAM: CT CERVICAL SPINE WITHOUT CONTRAST TECHNIQUE: Multidetector CT imaging of the cervical spine was performed without intravenous contrast. Multiplanar CT image reconstructions were also generated. COMPARISON:  X-ray 09/17/2016  FINDINGS: Alignment: Facet joints are aligned without dislocation or traumatic listhesis. Dens and lateral masses are aligned. Skull base and vertebrae: Minimally displaced fracture involving the posterior arch of C1 on the left (series 3, image 26). Fracture margins do not appear sclerotic. No additional fractures. No lytic or sclerotic bone lesion identified. Soft tissues and spinal canal: No prevertebral fluid or swelling. No visible canal hematoma. Disc levels: Minimal disc height loss at C5-6. No significant facet arthropathy. Upper chest: Included lung apices are clear. Other: Bilateral carotid and vertebral artery atherosclerosis. IMPRESSION: Acute-appearing minimally displaced fracture involving the posterior arch of C1 on the left. Recommend cervical spine MRI to evaluate for any potential ligamentous injury. These results were called by telephone at the time of interpretation on 11/10/2020 at 7:45 pm to provider [facility] LLCELIZABETH  REES , who verbally acknowledged these results. Electronically Signed   By: Duanne Guess D.O.   On: 11/10/2020 19:45   MR ANGIO HEAD WO CONTRAST  Result Date: 11/11/2020 CLINICAL DATA:  Stroke follow-up. Small acute white matter infarct in the posterior right frontal lobe on MRI yesterday. EXAM: MRA HEAD WITHOUT CONTRAST TECHNIQUE: Angiographic images of the Circle of Willis were acquired using MRA technique without intravenous contrast. COMPARISON:  No pertinent prior exam. FINDINGS: Anterior circulation: The internal carotid arteries are widely patent from skull base to carotid termini. ACAs and MCAs are patent without evidence of a proximal branch occlusion or significant proximal stenosis. No aneurysm is identified. Posterior circulation: The visualized distal vertebral arteries are widely patent to the basilar and codominant. Patent right PICA, bilateral AICA, and bilateral SCA origins are identified. The basilar artery is widely patent. There are large posterior communicating  arteries bilaterally. The left P1 segment is hypoplastic. Both PCAs are patent without evidence of a significant proximal stenosis. No aneurysm is identified. Anatomic variants: Fetal left PCA. IMPRESSION: Negative head MRA. Electronically Signed   By: Sebastian Ache M.D.   On: 11/11/2020 09:46   MR Brain W and Wo Contrast  Result Date: 11/11/2020 CLINICAL DATA:  Tremors and weakness EXAM: MRI HEAD WITHOUT AND WITH CONTRAST TECHNIQUE: Multiplanar, multiecho pulse sequences of the brain and surrounding structures were obtained without and with intravenous contrast. CONTRAST:  7mL GADAVIST GADOBUTROL 1 MMOL/ML IV SOLN COMPARISON:  None. FINDINGS: Brain: Small focus of abnormal diffusion restriction within the right frontal corona radiata, near the base of the precentral gyrus. Fewer than 5 scattered microhemorrhages in a nonspecific pattern. There is multifocal hyperintense T2-weighted signal within the white matter. Parenchymal volume and CSF spaces are normal. The midline structures are normal. No abnormal contrast enhancement. Vascular: Major flow voids are preserved. Skull and upper cervical spine: Normal calvarium and skull base. Visualized upper cervical spine and soft tissues are normal. Sinuses/Orbits:No paranasal sinus fluid levels or advanced mucosal thickening. No mastoid or middle ear effusion. Normal orbits. IMPRESSION: 1. Small focus of acute ischemia within the right frontal corona radiata, near the base of the precentral gyrus. No hemorrhage or mass effect. 2. Findings of chronic microvascular ischemia. Electronically Signed   By: Deatra Robinson M.D.   On: 11/11/2020 00:01   MR Cervical Spine W or Wo Contrast  Result Date: 11/11/2020 CLINICAL DATA:  Weakness and tremors EXAM: MRI CERVICAL SPINE WITHOUT AND WITH CONTRAST TECHNIQUE: Multiplanar and multiecho pulse sequences of the cervical spine, to include the craniocervical junction and cervicothoracic junction, were obtained without and with  intravenous contrast. CONTRAST:  7mL GADAVIST GADOBUTROL 1 MMOL/ML IV SOLN COMPARISON:  None. FINDINGS: Alignment: Normal Vertebrae: Mild nonspecific heterogeneity of the bone marrow signal. No focal abnormality. Cord: Normal Posterior Fossa, vertebral arteries, paraspinal tissues: Negative Disc levels: C1-C2: Normal. C2-C3: Normal disc space and facets. No spinal canal or neuroforaminal stenosis. C3-C4: Small disc bulge. Mild spinal canal narrowing. No foraminal stenosis. C4-C5: Small disc bulge with uncovertebral spurring. Mild bilateral foraminal stenosis. No spinal canal stenosis. C5-C6: Small left asymmetric disc bulge. No spinal canal stenosis. Moderate left foraminal stenosis. C6-C7: No disc herniation or stenosis. C7-T1: Normal disc space and facets. No spinal canal or neuroforaminal stenosis. IMPRESSION: 1. No acute abnormality of the cervical spine. 2. Mild spinal canal stenosis at C3-C4 secondary to small disc bulge. 3. Moderate left C5-C6 neural foraminal stenosis. 4. Mild bilateral C4-C5 neural foraminal stenosis. Electronically Signed   By: Caryn Bee  Chase Picket M.D.   On: 11/11/2020 00:04   ECHOCARDIOGRAM COMPLETE  Result Date: 11/11/2020    ECHOCARDIOGRAM REPORT   Patient Name:   TYRIANA HELMKAMP Date of Exam: 11/11/2020 Medical Rec #:  [id number]      Height:       62.0 in Accession #:    [id number]     Weight:       150.0 lb Date of Birth:  1955/09/29       BSA:          1.692 m Patient Age:    64 years       BP:           111/70 mmHg Patient Gender: F              HR:           70 bpm. Exam Location:  Inpatient Procedure: 2D Echo, Color Doppler and Cardiac Doppler Indications:    Stroke  History:        Patient has prior history of Echocardiogram examinations, most                 recent 03/15/2019. Previous Myocardial Infarction and CAD; Risk                 Factors:Hypertension and Dyslipidemia.  Sonographer:    Eulah Pont RDCS Referring Phys: [phone] Meryle Ready [facility] IMPRESSIONS  1. Left ventricular  ejection fraction, by estimation, is 60 to 65%. The left ventricle has normal function. The left ventricle has no regional wall motion abnormalities. Left ventricular diastolic parameters were normal.  2. Right ventricular systolic function is normal. The right ventricular size is normal. There is normal pulmonary artery systolic pressure.  3. The mitral valve is normal in structure. Mild mitral valve regurgitation. No evidence of mitral stenosis.  4. The aortic valve is tricuspid. Aortic valve regurgitation is trivial. No aortic stenosis is present.  5. The inferior vena cava is normal in size with greater than 50% respiratory variability, suggesting right atrial pressure of 3 mmHg. FINDINGS  Left Ventricle: Left ventricular ejection fraction, by estimation, is 60 to 65%. The left ventricle has normal function. The left ventricle has no regional wall motion abnormalities. The left ventricular internal cavity size was normal in size. There is  no left ventricular hypertrophy. Left ventricular diastolic parameters were normal. Right Ventricle: The right ventricular size is normal. Right ventricular systolic function is normal. There is normal pulmonary artery systolic pressure. The tricuspid regurgitant velocity is 2.26 m/s, and with an assumed right atrial pressure of 3 mmHg,  the estimated right ventricular systolic pressure is 23.4 mmHg. Left Atrium: Left atrial size was normal in size. Right Atrium: Right atrial size was normal in size. Pericardium: There is no evidence of pericardial effusion. Mitral Valve: The mitral valve is normal in structure. Mild mitral valve regurgitation. No evidence of mitral valve stenosis. Tricuspid Valve: The tricuspid valve is normal in structure. Tricuspid valve regurgitation is mild . No evidence of tricuspid stenosis. Aortic Valve: The aortic valve is tricuspid. Aortic valve regurgitation is trivial. No aortic stenosis is present. Pulmonic Valve: The pulmonic valve was not well  visualized. Pulmonic valve regurgitation is mild. No evidence of pulmonic stenosis. Aorta: The aortic root is normal in size and structure. Venous: The inferior vena cava is normal in size with greater than 50% respiratory variability, suggesting right atrial pressure of 3 mmHg. IAS/Shunts: No atrial level shunt detected by color flow Doppler.  LEFT  VENTRICLE PLAX 2D LVIDd:         3.60 cm  Diastology LVIDs:         2.50 cm  LV e' medial:    9.11 cm/s LV PW:         0.90 cm  LV E/e' medial:  9.0 LV IVS:        1.20 cm  LV e' lateral:   11.50 cm/s LVOT diam:     2.10 cm  LV E/e' lateral: 7.1 LV SV:         57 LV SV Index:   34 LVOT Area:     3.46 cm  RIGHT VENTRICLE RV S prime:     10.30 cm/s TAPSE (M-mode): 1.7 cm LEFT ATRIUM             Index       RIGHT ATRIUM           Index LA diam:        2.40 cm 1.42 cm/m  RA Area:     12.80 cm LA Vol (A2C):   46.9 ml 27.72 ml/m RA Volume:   29.70 ml  17.56 ml/m LA Vol (A4C):   41.0 ml 24.24 ml/m LA Biplane Vol: 44.1 ml 26.07 ml/m  AORTIC VALVE LVOT Vmax:   88.90 cm/s LVOT Vmean:  61.500 cm/s LVOT VTI:    0.166 m  AORTA Ao Root diam: 2.90 cm Ao Asc diam:  3.20 cm MITRAL VALVE               TRICUSPID VALVE MV Area (PHT): 3.65 cm    TR Peak grad:   20.4 mmHg MV Decel Time: 208 msec    TR Vmax:        226.00 cm/s MV E velocity: 81.80 cm/s MV A velocity: 93.40 cm/s  SHUNTS MV E/A ratio:  0.88        Systemic VTI:  0.17 m                            Systemic Diam: 2.10 cm Olga Millers MD Electronically signed by Olga Millers MD Signature Date/Time: 11/11/2020/1:01:00 PM    Final    VAS US CAROTID  Result Date: 11/11/2020 Carotid Arterial Duplex Study Patient Name:  KAJAH SANTIZO  Date of Exam:   11/11/2020 Medical Rec #: [id number]       Accession #:    [id number] Date of Birth: [birth year]        Patient Gender: F Patient Age:   64Y Exam Location:  [facility] Procedure:      VAS US CAROTID Referring Phys: [id number] Marvel Plan  --------------------------------------------------------------------------------  Indications:  CVA. Risk Factors: Hypertension, hyperlipidemia, coronary artery disease. Performing Technologist: Argentina Ponder RVS  Examination Guidelines: A complete evaluation includes B-mode imaging, spectral Doppler, color Doppler, and power Doppler as needed of all accessible portions of each vessel. Bilateral testing is considered an integral part of a complete examination. Limited examinations for reoccurring indications may be performed as noted.  Right Carotid Findings: +----------+--------+--------+--------+------------------+--------+           PSV cm/sEDV cm/sStenosisPlaque DescriptionComments +----------+--------+--------+--------+------------------+--------+ CCA Prox  75      16              heterogenous               +----------+--------+--------+--------+------------------+--------+ CCA Distal67      16  heterogenous               +----------+--------+--------+--------+------------------+--------+ ICA Prox  61      21      1-39%   heterogenous               +----------+--------+--------+--------+------------------+--------+ ICA Distal58      19                                         +----------+--------+--------+--------+------------------+--------+ ECA       83                                                 +----------+--------+--------+--------+------------------+--------+ +----------+--------+-------+--------+-------------------+           PSV cm/sEDV cmsDescribeArm Pressure (mmHG) +----------+--------+-------+--------+-------------------+ [id number]                                         +----------+--------+-------+--------+-------------------+ +---------+--------+--+--------+--+---------+ VertebralPSV cm/s51EDV cm/s12Antegrade +---------+--------+--+--------+--+---------+  Left Carotid Findings:  +----------+--------+--------+--------+------------------+--------+           PSV cm/sEDV cm/sStenosisPlaque DescriptionComments +----------+--------+--------+--------+------------------+--------+ CCA Prox  62      16              heterogenous               +----------+--------+--------+--------+------------------+--------+ CCA Distal77      25              heterogenous               +----------+--------+--------+--------+------------------+--------+ ICA Prox  63      18      1-39%   heterogenous               +----------+--------+--------+--------+------------------+--------+ ICA Distal79      29                                         +----------+--------+--------+--------+------------------+--------+ ECA       103     11                                         +----------+--------+--------+--------+------------------+--------+ +----------+--------+--------+--------+-------------------+           PSV cm/sEDV cm/sDescribeArm Pressure (mmHG) +----------+--------+--------+--------+-------------------+ [id number]                                          +----------+--------+--------+--------+-------------------+ +---------+--------+--------+---------------+ VertebralPSV cm/sEDV cm/sBi- directional +---------+--------+--------+---------------+   Summary: Right Carotid: Velocities in the right ICA are consistent with a 1-39% stenosis. Left Carotid: Velocities in the left ICA are consistent with a 1-39% stenosis. Vertebrals: Right vertebral artery demonstrates antegrade flow. Left vertebral             artery demonstrates bidirectional flow. *See table(s) above for measurements and observations.     Preliminary     Microbiology: Recent Results (from the past 240 hour(s))  Resp  Panel by RT-PCR (Flu A&B, Covid) Nasopharyngeal Swab     Status: None   Collection Time: 11/10/20  8:02 PM   Specimen: Nasopharyngeal Swab; Nasopharyngeal(NP) swabs in vial  transport medium  Result Value Ref Range Status   SARS Coronavirus 2 by RT PCR NEGATIVE NEGATIVE Final    Comment: (NOTE) SARS-CoV-2 target nucleic acids are NOT DETECTED.  The SARS-CoV-2 RNA is generally detectable in upper respiratory specimens during the acute phase of infection. The lowest concentration of SARS-CoV-2 viral copies this assay can detect is 138 copies/mL. A negative result does not preclude SARS-Cov-2 infection and should not be used as the sole basis for treatment or other patient management decisions. A negative result may occur with  improper specimen collection/handling, submission of specimen other than nasopharyngeal swab, presence of viral mutation(s) within the areas targeted by this assay, and inadequate number of viral copies(<138 copies/mL). A negative result must be combined with clinical observations, patient history, and epidemiological information. The expected result is Negative.  Fact Sheet for Patients:  BloggerCourse.com  Fact Sheet for Healthcare Providers:  SeriousBroker.it  This test is no t yet approved or cleared by the [place] FDA and  has been authorized for detection and/or diagnosis of SARS-CoV-2 by FDA under an Emergency Use Authorization (EUA). This EUA will remain  in effect (meaning this test can be used) for the duration of the COVID-19 declaration under Section 564(b)(1) of the Act, 21 U.S.C.section 360bbb-3(b)(1), unless the authorization is terminated  or revoked sooner.       Influenza A by PCR NEGATIVE NEGATIVE Final   Influenza B by PCR NEGATIVE NEGATIVE Final    Comment: (NOTE) The Xpert Xpress SARS-CoV-2/FLU/RSV plus assay is intended as an aid in the diagnosis of influenza from Nasopharyngeal swab specimens and should not be used as a sole basis for treatment. Nasal washings and aspirates are unacceptable for Xpert Xpress SARS-CoV-2/FLU/RSV testing.  Fact  Sheet for Patients: BloggerCourse.com  Fact Sheet for Healthcare Providers: SeriousBroker.it  This test is not yet approved or cleared by the [place] FDA and has been authorized for detection and/or diagnosis of SARS-CoV-2 by FDA under an Emergency Use Authorization (EUA). This EUA will remain in effect (meaning this test can be used) for the duration of the COVID-19 declaration under Section 564(b)(1) of the Act, 21 U.S.C. section 360bbb-3(b)(1), unless the authorization is terminated or revoked.  Performed at [facility], [address] [place], [place] [address]      Labs: Basic Metabolic Panel: Recent Labs  Lab 11/10/20 1734 11/11/20 0416  NA 138  --   K 3.7  --   CL 105  --   CO2 23  --   GLUCOSE 84  --   BUN 25*  --   CREATININE 1.01* 0.90  CALCIUM 9.4  --   MG 1.7  --    Liver Function Tests: Recent Labs  Lab 11/10/20 1734  AST 23  ALT 16  ALKPHOS 45  BILITOT 0.4  PROT 7.5  ALBUMIN 4.2   No results for input(s): LIPASE, AMYLASE in the last 168 hours. No results for input(s): AMMONIA in the last 168 hours. CBC: Recent Labs  Lab 11/10/20 1734 11/11/20 0416  WBC 4.1 3.3*  NEUTROABS 1.9  --   HGB 9.3* 8.8*  HCT 28.6* 28.3*  MCV 78.8* 81.3  PLT 162 165   Cardiac Enzymes: No results for input(s): CKTOTAL, CKMB, CKMBINDEX, TROPONINI in the last 168 hours. BNP: BNP (  last 3 results) No results for input(s): BNP in the last 8760 hours.  ProBNP (last 3 results) No results for input(s): PROBNP in the last 8760 hours.  CBG: Recent Labs  Lab 11/10/20 1638  GLUCAP 85       Signed:  Rhetta Mura MD   [facility] 11/11/2020, 4:45 PM

## 2020-11-11 NOTE — H&P (Addendum)
History and Physical    Diana [name]:[id number] DOB: 05/03/1956 DOA: 11/10/2020  PCP: Diana Oar, PA  Patient coming from: Home.  Chief Complaint: Unsteady gait.  HPI: Diana [name] is a 65 y.o. female with history of CAD status post CABG, hypertension has been experiencing unsteady gait for the last 1 week and at times shaking episodes.  Patient's mother also has felt that patient was having slurred speech today.  Denies any chest pain or shortness of breath.  ED Course: In the ER given the symptoms patient had CT head followed by MRI brain which showed stroke and admitted for further work-up.  Patient's COVID test was negative and labs were at baseline.  COVID test was negative.  EKG shows normal sinus rhythm.  Review of Systems: As per HPI, rest all negative.   Past Medical History:  Diagnosis Date   Allergy    Arthritis    feet   Bronchitis 10/30/2016   Carpal tunnel syndrome    Coronary artery disease    GERD (gastroesophageal reflux disease)    Hyperlipidemia    Hypertension    Non-ST elevation (NSTEMI) myocardial infarction (HCC) 10/30/2016   NSTEMI (non-ST elevated myocardial infarction) (HCC) 10/30/2016    Past Surgical History:  Procedure Laterality Date   CARPAL TUNNEL RELEASE Left 02/12/2016   Procedure: LEFT CARPAL TUNNEL RELEASE;  Surgeon: Diana Salt, MD;  Location: [facility];  Service: Orthopedics;  Laterality: Left;  FAB   CORONARY ARTERY BYPASS GRAFT N/A 01/10/2018   Procedure: CORONARY ARTERY BYPASS GRAFTING (CABG) TIMES 4, USING LEFT INTERNAL MAMMARY ARTERY TO OM1  AND ENDOSCOPICALLY HARVESTED RIGHT SAPHENOUS VEIN TO DIAGONAL, AND SEQUENTIALLY TO OM2 AND DISTAL CIRCUMFLEX;  Surgeon: Diana Ovens, MD;  Location: [facility] OR;  Service: Open Heart Surgery;  Laterality: N/A;   LEFT HEART CATH AND CORONARY ANGIOGRAPHY N/A 01/05/2018   Procedure: LEFT HEART CATH AND CORONARY ANGIOGRAPHY;  Surgeon: Diana Bihari, MD;  Location: [facility] INVASIVE CV  LAB;  Service: Cardiovascular;  Laterality: N/A;   TEE WITHOUT CARDIOVERSION N/A 01/10/2018   Procedure: TRANSESOPHAGEAL ECHOCARDIOGRAM (TEE);  Surgeon: Diana Ovens, MD;  Location: [facility] OR;  Service: Open Heart Surgery;  Laterality: N/A;   TONSILLECTOMY     TUBAL LIGATION     WRIST SURGERY Right      reports that she has never smoked. She has never used smokeless tobacco. She reports that she does not drink alcohol and does not use drugs.  Allergies  Allergen Reactions   Contrast Media [Iodinated Diagnostic Agents] Anaphylaxis    Had CT scan 12/14/17, had trouble breathing, throat swelled   Penicillins Rash and Other (See Comments)    PATIENT HAS HAD A PCN REACTION WITH IMMEDIATE RASH, FACIAL/TONGUE/THROAT SWELLING, SOB, OR LIGHTHEADEDNESS WITH HYPOTENSION:  #  #  YES  #  #  Has patient had a PCN reaction causing severe rash involving mucus membranes or skin necrosis:  no Has patient had a PCN reaction that required hospitalization:  no Has patient had a PCN reaction occurring within the last 10 years: no    Methocarbamol Nausea Only and Other (See Comments)    headache   Codeine Anxiety    Dizziness and headache    Family History  Problem Relation Age of Onset   Thyroid disease Mother    Hypertension Mother    Brain cancer Sister    HIV Sister    Healthy Brother    Liver disease Brother  Prior to Admission medications   Medication Sig Start Date End Date Taking? Authorizing Provider  acetaminophen (TYLENOL) 500 MG tablet Take 500 mg by mouth every 6 (six) hours as needed for headache.   Yes [provider]  Adhesive Tape (MUELLER KINESIOLOGY) TAPE Apply as directed to reduce muscle/joint pain 09/03/19  Yes [provider]  ALPRAZolam (XANAX) 0.5 MG tablet Take 1 tablet (0.5 mg total) by mouth 2 (two) times daily as needed for anxiety. 02/07/18  Yes Lezlie Lye, Meda Coffee, MD  aspirin EC 81 MG tablet Take 81 mg by mouth daily.   Yes [provider]  atorvastatin (LIPITOR) 40 MG tablet Take 40 mg by mouth daily. 08/27/20  Yes [provider]  cetirizine (ZYRTEC) 10 MG tablet Take 10 mg by mouth daily.   Yes [provider]  DULoxetine (CYMBALTA) 60 MG capsule Take 60 mg by mouth daily. 09/28/20  Yes [provider]  ferrous gluconate (FERGON) 324 MG tablet Take 2 tablets by mouth daily.  12/04/18  Yes [provider]  hydrOXYzine (ATARAX/VISTARIL) 10 MG tablet Take 10 mg by mouth 3 (three) times daily as needed for anxiety. 10/20/20  Yes [provider]  losartan-hydrochlorothiazide (HYZAAR) 50-12.5 MG tablet Take 1 tablet by mouth daily.   Yes [provider]  meloxicam (MOBIC) 15 MG tablet Take 15 mg by mouth daily. 02/19/19  Yes [provider]  Metoprolol Tartrate 75 MG TABS Take 75 mg by mouth every 12 (twelve) hours. 01/19/18  Yes Joycelyn Man, Donielle M, PA-C  montelukast (SINGULAIR) 10 MG tablet Take 10 mg by mouth at bedtime.   Yes [provider]  nitroGLYCERIN (NITROSTAT) 0.4 MG SL tablet Place 1 tablet (0.4 mg total) under the tongue every 5 (five) minutes as needed for chest pain. 05/30/18 11/11/20 Yes Camnitz, Diana Daphine Deutscher, MD  omeprazole (PRILOSEC) 20 MG capsule Take 1 capsule (20 mg total) by mouth daily. 12/15/17  Yes Diana Mocha, MD  polyethylene glycol powder (GLYCOLAX/MIRALAX) 17 GM/SCOOP powder Take 17 g by mouth every evening.   Yes [provider]  pregabalin (LYRICA) 150 MG capsule Take 150 mg by mouth 2 (two) times daily. 03/27/18  Yes [provider]  ranolazine (RANEXA) 500 MG 12 hr tablet Take 1,000 mg by mouth 2 (two) times daily.   Yes [provider]  traZODone (DESYREL) 50 MG tablet Take 25-50 mg by mouth at bedtime as needed for sleep. 03/27/18  Yes [provider]  vitamin C (ASCORBIC ACID) 500 MG tablet Take 500 mg by mouth daily.   Yes [provider]  Vitamin D, Cholecalciferol, 50 MCG (2000 UT) CAPS  Take 2,000 Units by mouth daily.   Yes [provider]  atorvastatin (LIPITOR) 80 MG tablet Take 0.5 tablets (40 mg total) by mouth daily at 6 PM. Patient not taking: Reported on 11/11/2020 04/05/18   Georgeanna Lea, MD    Physical Exam: Constitutional: Moderately built and nourished. Vitals:   11/10/20 2000 11/10/20 2100 11/10/20 2158 11/11/20 0009  BP: (!) 175/93 (!) 152/86 (!) 152/78 134/81  Pulse: 73 72 71 73  Resp: (!) 23 (!) 21 16 16   Temp:  97.7 F (36.5 C) (!) 97.5 F (36.4 C)   TempSrc:   Oral   SpO2: 98% 98% 96% 95%  Weight:      Height:       Eyes: Anicteric no pallor. ENMT: No discharge from the ears eyes nose and mouth. Neck: No mass felt.  No neck rigidity. Respiratory: No rhonchi or crepitations. Cardiovascular: S1-S2 heard. Abdomen: Soft nontender bowel sounds present. Musculoskeletal: No edema. Skin: No rash. Neurologic: Alert awake oriented to time place and person.  Moves all extremities 5 x 5.  No facial asymmetry tongue is midline. Psychiatric: Appears normal.  Normal affect.   Labs on Admission: I have personally reviewed following labs and imaging studies  CBC: Recent Labs  Lab 11/10/20 1734  WBC 4.1  NEUTROABS 1.9  HGB 9.3*  HCT 28.6*  MCV 78.8*  PLT 162   Basic Metabolic Panel: Recent Labs  Lab 11/10/20 1734  NA 138  K 3.7  CL 105  CO2 23  GLUCOSE 84  BUN 25*  CREATININE 1.01*  CALCIUM 9.4  MG 1.7   GFR: Estimated Creatinine Clearance: 52.2 mL/min (A) (by C-G formula based on SCr of 1.01 mg/dL (H)). Liver Function Tests: Recent Labs  Lab 11/10/20 1734  AST 23  ALT 16  ALKPHOS 45  BILITOT 0.4  PROT 7.5  ALBUMIN 4.2   No results for input(s): LIPASE, AMYLASE in the last 168 hours. No results for input(s): AMMONIA in the last 168 hours. Coagulation Profile: No results for input(s): INR, PROTIME in the last 168 hours. Cardiac Enzymes: No results for input(s): CKTOTAL, CKMB, CKMBINDEX, TROPONINI in the last  168 hours. BNP (last 3 results) No results for input(s): PROBNP in the last 8760 hours. HbA1C: No results for input(s): HGBA1C in the last 72 hours. CBG: Recent Labs  Lab 11/10/20 1638  GLUCAP 85   Lipid Profile: No results for input(s): CHOL, HDL, LDLCALC, TRIG, CHOLHDL, LDLDIRECT in the last 72 hours. Thyroid Function Tests: No results for input(s): TSH, T4TOTAL, FREET4, T3FREE, THYROIDAB in the last 72 hours. Anemia Panel: No results for input(s): VITAMINB12, FOLATE, FERRITIN, TIBC, IRON, RETICCTPCT in the last 72 hours. Urine analysis:    Component Value Date/Time   COLORURINE YELLOW 01/14/2018 0433   APPEARANCEUR CLEAR 01/14/2018 0433   LABSPEC 1.017 01/14/2018 0433   PHURINE 8.0 01/14/2018 0433   GLUCOSEU NEGATIVE 01/14/2018 0433   HGBUR SMALL (A) 01/14/2018 0433   BILIRUBINUR NEGATIVE 01/14/2018 0433   BILIRUBINUR negative 12/15/2017 1555   KETONESUR 20 (A) 01/14/2018 0433   PROTEINUR NEGATIVE 01/14/2018 0433   UROBILINOGEN 0.2 12/15/2017 1555   NITRITE NEGATIVE 01/14/2018 0433   LEUKOCYTESUR NEGATIVE 01/14/2018 0433   Sepsis Labs: @LABRCNTIP (procalcitonin:4,lacticidven:4) ) Recent Results (from the past 240 hour(s))  Resp Panel by RT-PCR (Flu A&B, Covid) Nasopharyngeal Swab     Status: None   Collection Time: 11/10/20  8:02 PM   Specimen: Nasopharyngeal Swab; Nasopharyngeal(NP) swabs in vial transport medium  Result Value Ref Range Status   SARS Coronavirus 2 by RT PCR NEGATIVE NEGATIVE Final    Comment: (NOTE) SARS-CoV-2 target nucleic acids are NOT DETECTED.  The SARS-CoV-2 RNA is generally detectable in upper respiratory specimens during the acute phase of infection. The lowest concentration of SARS-CoV-2 viral copies this assay can detect is 138 copies/mL. A negative result does not preclude SARS-Cov-2 infection and should not be used as the sole basis for treatment or other patient management decisions. A negative result may occur with  improper  specimen collection/handling, submission of specimen other than nasopharyngeal swab, presence of viral mutation(s) within the areas targeted by this assay, and inadequate number of viral copies(<138 copies/mL). A negative result must be combined with clinical observations, patient history, and epidemiological information. The expected result is Negative.  Fact Sheet for Patients:  11/12/20  Fact  Sheet for Healthcare Providers:  SeriousBroker.it  This test is no t yet approved or cleared by the [place] FDA and  has been authorized for detection and/or diagnosis of SARS-CoV-2 by FDA under an Emergency Use Authorization (EUA). This EUA Diana remain  in effect (meaning this test can be used) for the duration of the COVID-19 declaration under Section 564(b)(1) of the Act, 21 U.S.C.section 360bbb-3(b)(1), unless the authorization is terminated  or revoked sooner.       Influenza A by PCR NEGATIVE NEGATIVE Final   Influenza B by PCR NEGATIVE NEGATIVE Final    Comment: (NOTE) The Xpert Xpress SARS-CoV-2/FLU/RSV plus assay is intended as an aid in the diagnosis of influenza from Nasopharyngeal swab specimens and should not be used as a sole basis for treatment. Nasal washings and aspirates are unacceptable for Xpert Xpress SARS-CoV-2/FLU/RSV testing.  Fact Sheet for Patients: BloggerCourse.com  Fact Sheet for Healthcare Providers: SeriousBroker.it  This test is not yet approved or cleared by the [place] FDA and has been authorized for detection and/or diagnosis of SARS-CoV-2 by FDA under an Emergency Use Authorization (EUA). This EUA Diana remain in effect (meaning this test can be used) for the duration of the COVID-19 declaration under Section 564(b)(1) of the Act, 21 U.S.C. section 360bbb-3(b)(1), unless the authorization is terminated or revoked.  Performed at  [facility], [address] [place], [place] [address]      Radiological Exams on Admission: DG Chest 2 View  Result Date: 11/10/2020 CLINICAL DATA:  Weakness and unsteady gait. EXAM: CHEST - 2 VIEW COMPARISON:  02/20/2018 FINDINGS: Postoperative changes in the mediastinum. Heart size and pulmonary vascularity are normal. Lungs are clear. No pleural effusions. No pneumothorax. Mediastinal contours appear intact. IMPRESSION: No active cardiopulmonary disease. Electronically Signed   By: Burman Nieves M.D.   On: 11/10/2020 18:04   CT Head Wo Contrast  Result Date: 11/10/2020 CLINICAL DATA:  Generalized weakness for 1 week. Shaky feeling with unsteady gait. Headache. Mental status change. EXAM: CT HEAD WITHOUT CONTRAST TECHNIQUE: Contiguous axial images were obtained from the base of the skull through the vertex without intravenous contrast. COMPARISON:  04/19/2008 FINDINGS: Brain: No evidence of acute infarction, hemorrhage, hydrocephalus, extra-axial collection or mass lesion/mass effect. Mild diffuse cerebral atrophy. Patchy low-attenuation changes in the deep white matter consistent with small vessel ischemic changes. Vascular: Moderate intracranial arterial vascular calcifications. Skull: Calvarium appears intact. Sinuses/Orbits: Paranasal sinuses and mastoid air cells are clear. Other: None. IMPRESSION: No acute intracranial abnormalities. Mild chronic atrophy and small vessel ischemic changes. Electronically Signed   By: Burman Nieves M.D.   On: 11/10/2020 18:04   CT Cervical Spine Wo Contrast  Result Date: 11/10/2020 CLINICAL DATA:  Neck trauma EXAM: CT CERVICAL SPINE WITHOUT CONTRAST TECHNIQUE: Multidetector CT imaging of the cervical spine was performed without intravenous contrast. Multiplanar CT image reconstructions were also generated. COMPARISON:  X-ray 09/17/2016 FINDINGS: Alignment: Facet joints are aligned without dislocation or traumatic listhesis. Dens and lateral  masses are aligned. Skull base and vertebrae: Minimally displaced fracture involving the posterior arch of C1 on the left (series 3, image 26). Fracture margins do not appear sclerotic. No additional fractures. No lytic or sclerotic bone lesion identified. Soft tissues and spinal canal: No prevertebral fluid or swelling. No visible canal hematoma. Disc levels: Minimal disc height loss at C5-6. No significant facet arthropathy. Upper chest: Included lung apices are clear. Other: Bilateral carotid and vertebral artery atherosclerosis. IMPRESSION: Acute-appearing minimally displaced fracture involving  the posterior arch of C1 on the left. Recommend cervical spine MRI to evaluate for any potential ligamentous injury. These results were called by telephone at the time of interpretation on 11/10/2020 at 7:45 pm to provider [facility] REES , who verbally acknowledged these results. Electronically Signed   By: Duanne GuessNicholas  Plundo D.O.   On: 11/10/2020 19:45   MR Brain W and Wo Contrast  Result Date: 11/11/2020 CLINICAL DATA:  Tremors and weakness EXAM: MRI HEAD WITHOUT AND WITH CONTRAST TECHNIQUE: Multiplanar, multiecho pulse sequences of the brain and surrounding structures were obtained without and with intravenous contrast. CONTRAST:  7mL GADAVIST GADOBUTROL 1 MMOL/ML IV SOLN COMPARISON:  None. FINDINGS: Brain: Small focus of abnormal diffusion restriction within the right frontal corona radiata, near the base of the precentral gyrus. Fewer than 5 scattered microhemorrhages in a nonspecific pattern. There is multifocal hyperintense T2-weighted signal within the white matter. Parenchymal volume and CSF spaces are normal. The midline structures are normal. No abnormal contrast enhancement. Vascular: Major flow voids are preserved. Skull and upper cervical spine: Normal calvarium and skull base. Visualized upper cervical spine and soft tissues are normal. Sinuses/Orbits:No paranasal sinus fluid levels or advanced mucosal  thickening. No mastoid or middle ear effusion. Normal orbits. IMPRESSION: 1. Small focus of acute ischemia within the right frontal corona radiata, near the base of the precentral gyrus. No hemorrhage or mass effect. 2. Findings of chronic microvascular ischemia. Electronically Signed   By: Deatra RobinsonKevin  Herman M.D.   On: 11/11/2020 00:01   MR Cervical Spine W or Wo Contrast  Result Date: 11/11/2020 CLINICAL DATA:  Weakness and tremors EXAM: MRI CERVICAL SPINE WITHOUT AND WITH CONTRAST TECHNIQUE: Multiplanar and multiecho pulse sequences of the cervical spine, to include the craniocervical junction and cervicothoracic junction, were obtained without and with intravenous contrast. CONTRAST:  7mL GADAVIST GADOBUTROL 1 MMOL/ML IV SOLN COMPARISON:  None. FINDINGS: Alignment: Normal Vertebrae: Mild nonspecific heterogeneity of the bone marrow signal. No focal abnormality. Cord: Normal Posterior Fossa, vertebral arteries, paraspinal tissues: Negative Disc levels: C1-C2: Normal. C2-C3: Normal disc space and facets. No spinal canal or neuroforaminal stenosis. C3-C4: Small disc bulge. Mild spinal canal narrowing. No foraminal stenosis. C4-C5: Small disc bulge with uncovertebral spurring. Mild bilateral foraminal stenosis. No spinal canal stenosis. C5-C6: Small left asymmetric disc bulge. No spinal canal stenosis. Moderate left foraminal stenosis. C6-C7: No disc herniation or stenosis. C7-T1: Normal disc space and facets. No spinal canal or neuroforaminal stenosis. IMPRESSION: 1. No acute abnormality of the cervical spine. 2. Mild spinal canal stenosis at C3-C4 secondary to small disc bulge. 3. Moderate left C5-C6 neural foraminal stenosis. 4. Mild bilateral C4-C5 neural foraminal stenosis. Electronically Signed   By: Deatra RobinsonKevin  Herman M.D.   On: 11/11/2020 00:04    EKG: Independently reviewed.  Normal sinus rhythm.  Assessment/Plan Principal Problem:   Acute CVA (cerebrovascular accident) [facility](HCC) Active Problems:   Benign  essential HTN   Iron deficiency anemia   CAD (coronary artery disease), native coronary artery   S/P CABG x 4    Acute CVA -discussed with on-call neurologist Dr. Tyler Aasverall at this time plan is to get MR angio of the head and carotid Doppler 2D echo monitor in telemetry patient passed stroke swallow neurochecks aspirin statin and physical therapy.  Check hemoglobin A1c lipid panel. Hypertension -allow for permissive hypertension.  As needed IV hydralazine for systolic blood pressure more than 220. History of CAD status post CABG denies any chest pain.  Continue antiplatelet agents beta-blockers and statins. Anemia -  appears to be chronic follow CBC.   DVT prophylaxis: Lovenox. Code Status: Full code. Family Communication: Patient's mother. Disposition Plan: Home. Consults called: Neurologist. Admission status: Observation.   Eduard Clos MD [facility] Pager [phone].  If 7PM-7AM, please contact night-coverage [email or web address] Password [facility]  11/11/2020, 2:49 AM

## 2020-11-11 NOTE — Progress Notes (Signed)
OT Cancellation Note  Patient Details Name: Diana [name] MRN: [id number] DOB: 07-26-1955   Cancelled Treatment:    Reason Eval/Treat Not Completed: Other (comment) Per CT, acute appearing minimally displaced C1 fx. Will assess/mobilize once cleared by MD.  Jannifer Hick 11/11/2020, 9:06 AM Luisa Dago, OT/L   Acute OT Clinical Specialist Acute Rehabilitation Services Pager [phone] Office [phone]

## 2020-11-11 NOTE — Progress Notes (Signed)
Occupational Therapy Evaluation Patient Details Name: NATALIA WITTMEYER MRN: [id number] DOB: 10-Aug-1955 Today's Date: 11/11/2020    History of Present Illness Pt is a 65 y.o. presenting 11/10/20 with several days of weakness, generally feeling unwell, and frequent falls. MRI showed small focus of acute ischemia within the right frontal corona radiata. MRI neck confirms that there is no acute C1 fx (cleared by neuro surgery). PMH includes arthritis, CABG, HTN, NSTEMI.   Clinical Impression   PTA pt lives independently alone. She is involved in outpt PT 2-3x/wk. Pt demonstrates mild L sided incoordination, but functional. Slow processing noted however pt scored in the Normal range on the Short Blessed Test of cognition.  Pt states she has had a "little problem with thinking" since her CABG surgery 2 years ago. Able to describe all current medications and asking if medications would be changed. Pt endorses that she has forgotten medications on a couple of occasions. Discussed having family help her set timers to help with medication management. Pt states she feels safe going home and that her family can stay with her initially and assist with driving until she returns completely to baseline. No further OT indicated at this time.     Follow Up Recommendations  No OT follow up;Supervision - Intermittent (S with IADL tasks; refrain from driving at this time)    Equipment Recommendations  None recommended by OT    Recommendations for Other Services       Precautions / Restrictions Precautions Precautions: Fall Restrictions Weight Bearing Restrictions: No      Mobility Bed Mobility Overal bed mobility: Independent             General bed mobility comments: HOB slightly elevated    Transfers Overall transfer level: Needs assistance Equipment used: None Transfers: Sit to/from Stand Sit to Stand: Supervision              Balance Overall balance assessment: Needs  assistance Sitting-balance support: No upper extremity supported;Feet unsupported Sitting balance-Leahy Scale: Good Sitting balance - Comments: Able to adjust bilateral socks sitting EOB   Standing balance support: No upper extremity supported;During functional activity Standing balance-Leahy Scale: Good Standing balance comment: Can static stand and perform dynamic tasks without UE support             High level balance activites: Side stepping;Backward walking;Direction changes;Turns;Sudden stops;Head turns High Level Balance Comments: slight instability noted with stepping over objects, no LOB           ADL either performed or assessed with clinical judgement   ADL Overall ADL's : At baseline                                       General ADL Comments: Appears at baseline with basic ADL tasks; able to describe all medications she is currently taking although unable ot name them - asking appropriate questions about possible medication changes and DC plans     Vision Baseline Vision/History: Wears glasses Wears Glasses: Reading only Patient Visual Report: No change from baseline Vision Assessment?: Yes Eye Alignment: Within Functional Limits Ocular Range of Motion: Within Functional Limits Alignment/Gaze Preference: Within Defined Limits Tracking/Visual Pursuits: Able to track stimulus in all quads without difficulty Saccades: Within functional limits Convergence: Within functional limits Visual Fields: No apparent deficits     Perception Perception Comments: appears intact   Praxis Praxis Praxis tested?: Within functional limits  Pertinent Vitals/Pain Pain Assessment: No/denies pain     Hand Dominance Right   Extremity/Trunk Assessment Upper Extremity Assessment Upper Extremity Assessment: LUE deficits/detail LUE Coordination: decreased fine motor (mild coordination deficits; "clumsy" but using functionally)   Lower Extremity  Assessment Lower Extremity Assessment: RLE deficits/detail;LLE deficits/detail;Defer to PT evaluation   Cervical / Trunk Assessment Cervical / Trunk Assessment: Normal;Other exceptions (complains of R flank discomfort - since her CABG)   Communication Communication Communication: No difficulties;Other (comment) (pt states at baseline)   Cognition Arousal/Alertness: Awake/alert Behavior During Therapy: WFL for tasks assessed/performed Overall Cognitive Status: No family/caregiver present to determine baseline cognitive functioning (most likely cognitive deficits at baseline after CABG per conservation with pt. slow processing noted. Assessed with the Short Blessed Test - scored a 2 which places her in the normal range)                                     General Comments  Resting BP 124/74, HR 70s, SpO2 98% on RA    Exercises     Shoulder Instructions      Home Living Family/patient expects to be discharged to:: Private residence Living Arrangements: Alone Available Help at Discharge: Family;Available PRN/intermittently Type of Home: Other(Comment) (condo) Home Access: Stairs to enter Entrance Stairs-Number of Steps: a few Entrance Stairs-Rails: Right;Left Home Layout: Two level;Able to live on main level with bedroom/bathroom     Bathroom Shower/Tub: Tub/shower unit;Curtain   Firefighter: Standard Bathroom Accessibility: Yes How Accessible: Accessible via walker Home Equipment: None          Prior Functioning/Environment Level of Independence: Independent        Comments: going to outpt PT 2-3x/wk due to falls and weakness; drives; manages medications; cooks primarily using microwave and air fryer        OT Problem List: Decreased coordination;Pain      OT Treatment/Interventions:      OT Goals(Current goals can be found in the care plan section) Acute Rehab OT Goals Patient Stated Goal: to go home OT Goal Formulation: All assessment and  education complete, DC therapy  OT Frequency:     Barriers to D/C:            Co-evaluation PT/OT/SLP Co-Evaluation/Treatment: Yes            AM-PAC OT "6 Clicks" Daily Activity     Outcome Measure Help from another person eating meals?: None Help from another person taking care of personal grooming?: None Help from another person toileting, which includes using toliet, bedpan, or urinal?: None Help from another person bathing (including washing, rinsing, drying)?: None Help from another person to put on and taking off regular upper body clothing?: None Help from another person to put on and taking off regular lower body clothing?: None 6 Click Score: 24   End of Session Equipment Utilized During Treatment: Gait belt Nurse Communication: Mobility status  Activity Tolerance: Patient tolerated treatment well Patient left: in bed;with call bell/phone within reach (ED stretcher)  OT Visit Diagnosis: Unsteadiness on feet (R26.81);Muscle weakness (generalized) (M62.81)                Time: 1025-8527 OT Time Calculation (min): 20 min Charges:  OT General Charges $OT Visit: 1 Visit OT Evaluation $OT Eval Low Complexity: 1 Low  Johnathon Mittal, OT/L   Acute OT Clinical Specialist Acute Rehabilitation Services Pager [phone] Office [phone]   [employer]  11/11/2020, 4:02 PM

## 2020-11-11 NOTE — ED Notes (Signed)
Patient transported to MRI 

## 2020-11-11 NOTE — ED Notes (Signed)
Patient has returned from Echo

## 2020-11-11 NOTE — Progress Notes (Signed)
  Echocardiogram 2D Echocardiogram has been performed.  Augustine Radar 11/11/2020, 11:43 AM

## 2020-11-11 NOTE — ED Notes (Signed)
Patient transported to Echo.

## 2020-11-11 NOTE — ED Notes (Signed)
Attempted Report x1.   

## 2020-11-11 NOTE — ED Notes (Signed)
Discharge instructions given to mother whose at bedside along with the patient. They verbalize agreement to the instructions and do not have any questions.

## 2020-11-11 NOTE — ED Notes (Signed)
Admitting MD at bedside. Notified him that pt is in MRI. Notified primary RN that MD wants to be messaged when pt gets back.

## 2020-11-11 NOTE — Hospital Course (Addendum)
12 black female CABG X4 12/2017-prior NSTEMI?,  HTN, reflux, IDA, Chronic thoracic spine pain secondary to fall with neck trauma-chronic cervicalgia Depression CVA 2020?-(Patient allergic to contrast-cannot get CTA)  Presents 11/10/2020 [facility] [place] weakness X 1 week feeling shaky Also severe headache  CT scan = ??C1 fracture-acute,  MRI did not confirm any fracture of the cervical spine  Neurology/neurosurgery Dr. Danielle Dess consulted--he has verbalized that patient can have mobility as tolerated given no confirmation of acute fracture  MRI brain = small infarction right corona radiata precentral gyru  Pertinent labs and imaging WBC 3, hemoglobin 8, platelet 165 [patient does not have any known thrombocytopenia]-iron studies 2021 normal with saturation ratios 27  BUNs/creatinine baseline 10/0.6-25/1.01 EKG my over read NSR no ST-T wave changes   Echocardiogram = EF 60-65% normal pulmonary arterial pressures Carotid duplex = right carotid and left carotid 1 to 39% stenosis MRI brain = negative head MRI for stroke-fetal left PCA

## 2020-11-11 NOTE — ED Notes (Signed)
Happy meal w/ sprite provided.

## 2020-11-11 NOTE — Consult Note (Signed)
Reason for Consult: Possible neck injury weakness in extremities Referring Physician: [name] is an 65 y.o. female.  HPI: The patient is a 65 year old individual who is a rather poor and vague historian.  She admits to a fall perhaps a few weeks ago and admits to feeling weak over the last few weeks time and notes that she has had some diffuse neck pain.  She was seen in the emergency room at [facility].  She was transferred here because it was felt that she might have a C1 fracture that would require an MRI scan.  The MRI scan was finally performed earlier this morning.  I reviewed the CT of the cervical spine which demonstrates that the patient has incomplete formation of the posterior arch of C1 on the left side with a gap in the bone between the left arch and the lateral mass.  The MRI confirms that there is no acute evidence of a fracture in this region and the patient has some typical spondylitic changes throughout the cervical spine.  MRI of the brain demonstrates that the patient has evidence of a fairly recent small lacunar infarct in the parietal region on the right and an old pontine infarct.  She notes that she feels much stronger today.  Past Medical History:  Diagnosis Date   Allergy    Arthritis    feet   Bronchitis 10/30/2016   Carpal tunnel syndrome    Coronary artery disease    GERD (gastroesophageal reflux disease)    Hyperlipidemia    Hypertension    Non-ST elevation (NSTEMI) myocardial infarction (HCC) 10/30/2016   NSTEMI (non-ST elevated myocardial infarction) (HCC) 10/30/2016    Past Surgical History:  Procedure Laterality Date   CARPAL TUNNEL RELEASE Left 02/12/2016   Procedure: LEFT CARPAL TUNNEL RELEASE;  Surgeon: Cindee Salt, MD;  Location: [facility];  Service: Orthopedics;  Laterality: Left;  FAB   CORONARY ARTERY BYPASS GRAFT N/A 01/10/2018   Procedure: CORONARY ARTERY BYPASS GRAFTING (CABG) TIMES 4, USING LEFT INTERNAL  MAMMARY ARTERY TO OM1  AND ENDOSCOPICALLY HARVESTED RIGHT SAPHENOUS VEIN TO DIAGONAL, AND SEQUENTIALLY TO OM2 AND DISTAL CIRCUMFLEX;  Surgeon: Delight Ovens, MD;  Location: [facility] OR;  Service: Open Heart Surgery;  Laterality: N/A;   LEFT HEART CATH AND CORONARY ANGIOGRAPHY N/A 01/05/2018   Procedure: LEFT HEART CATH AND CORONARY ANGIOGRAPHY;  Surgeon: Lennette Bihari, MD;  Location: [facility] INVASIVE CV [facility];  Service: Cardiovascular;  Laterality: N/A;   TEE WITHOUT CARDIOVERSION N/A 01/10/2018   Procedure: TRANSESOPHAGEAL ECHOCARDIOGRAM (TEE);  Surgeon: Delight Ovens, MD;  Location: [facility] OR;  Service: Open Heart Surgery;  Laterality: N/A;   TONSILLECTOMY     TUBAL LIGATION     WRIST SURGERY Right     Family History  Problem Relation Age of Onset   Thyroid disease Mother    Hypertension Mother    Brain cancer Sister    HIV Sister    Healthy Brother    Liver disease Brother     Social History:  reports that she has never smoked. She has never used smokeless tobacco. She reports that she does not drink alcohol and does not use drugs.  Allergies:  Allergies  Allergen Reactions   Contrast Media [Iodinated Diagnostic Agents] Anaphylaxis    Had CT scan 12/14/17, had trouble breathing, throat swelled   Penicillins Rash and Other (See Comments)    PATIENT HAS HAD A PCN REACTION WITH IMMEDIATE RASH,  FACIAL/TONGUE/THROAT SWELLING, SOB, OR LIGHTHEADEDNESS WITH HYPOTENSION:  #  #  YES  #  #  Has patient had a PCN reaction causing severe rash involving mucus membranes or skin necrosis:  no Has patient had a PCN reaction that required hospitalization:  no Has patient had a PCN reaction occurring within the last 10 years: no    Methocarbamol Nausea Only and Other (See Comments)    headache   Codeine Anxiety    Dizziness and headache    Medications: I have reviewed the patient's current medications.  Results for orders placed or performed during the hospital encounter of 11/10/20 (from the past 48  hour(s))  POC CBG, ED     Status: None   Collection Time: 11/10/20  4:38 PM  Result Value Ref Range   Glucose-Capillary 85 70 - 99 mg/dL    Comment: Glucose reference range applies only to samples taken after fasting for at least 8 hours.  Comprehensive metabolic panel     Status: Abnormal   Collection Time: 11/10/20  5:34 PM  Result Value Ref Range   Sodium 138 135 - 145 mmol/L   Potassium 3.7 3.5 - 5.1 mmol/L   Chloride 105 98 - 111 mmol/L   CO2 23 22 - 32 mmol/L   Glucose, Bld 84 70 - 99 mg/dL    Comment: Glucose reference range applies only to samples taken after fasting for at least 8 hours.   BUN 25 (H) 8 - 23 mg/dL   Creatinine, Ser 1.61 (H) 0.44 - 1.00 mg/dL   Calcium 9.4 8.9 - 09.6 mg/dL   Total Protein 7.5 6.5 - 8.1 g/dL   Albumin 4.2 3.5 - 5.0 g/dL   AST 23 15 - 41 U/L   ALT 16 0 - 44 U/L   Alkaline Phosphatase 45 38 - 126 U/L   Total Bilirubin 0.4 0.3 - 1.2 mg/dL   GFR, Estimated >04 >54 mL/min    Comment: (NOTE) Calculated using the CKD-EPI Creatinine Equation (2021)    Anion gap 10 5 - 15    Comment: Performed at [facility], [address] [place], [place] [address]  CBC with Differential     Status: Abnormal   Collection Time: 11/10/20  5:34 PM  Result Value Ref Range   WBC 4.1 4.0 - 10.5 K/uL   RBC 3.63 (L) 3.87 - 5.11 MIL/uL   Hemoglobin 9.3 (L) 12.0 - 15.0 g/dL   HCT 91.4 (L) 78.2 - 95.6 %   MCV 78.8 (L) 80.0 - 100.0 fL   MCH 25.6 (L) 26.0 - 34.0 pg   MCHC 32.5 30.0 - 36.0 g/dL   RDW 21.3 08.6 - 57.8 %   Platelets 162 150 - 400 K/uL   nRBC 0.0 0.0 - 0.2 %   Neutrophils Relative % 45 %   Neutro Abs 1.9 1.7 - 7.7 K/uL   Lymphocytes Relative 37 %   Lymphs Abs 1.5 0.7 - 4.0 K/uL   Monocytes Relative 16 %   Monocytes Absolute 0.7 0.1 - 1.0 K/uL   Eosinophils Relative 1 %   Eosinophils Absolute 0.0 0.0 - 0.5 K/uL   Basophils Relative 0 %   Basophils Absolute 0.0 0.0 - 0.1 K/uL   Immature Granulocytes 1 %   Abs Immature Granulocytes 0.02  0.00 - 0.07 K/uL    Comment: Performed at [facility], [address] [place], [place] [address]  Magnesium     Status: None   Collection  Time: 11/10/20  5:34 PM  Result Value Ref Range   Magnesium 1.7 1.7 - 2.4 mg/dL    Comment: Performed at [facility], [address] [place], [place] [address]  Ethanol     Status: None   Collection Time: 11/10/20  5:34 PM  Result Value Ref Range   Alcohol, Ethyl (B) <10 <10 mg/dL    Comment:        LOWEST DETECTABLE LIMIT FOR SERUM ALCOHOL IS 10 mg/dL FOR MEDICAL PURPOSES ONLY Performed at [facility], [address] [place], [place] [address]   Resp Panel by RT-PCR (Flu A&B, Covid) Nasopharyngeal Swab     Status: None   Collection Time: 11/10/20  8:02 PM   Specimen: Nasopharyngeal Swab; Nasopharyngeal(NP) swabs in vial transport medium  Result Value Ref Range   SARS Coronavirus 2 by RT PCR NEGATIVE NEGATIVE    Comment: (NOTE) SARS-CoV-2 target nucleic acids are NOT DETECTED.  The SARS-CoV-2 RNA is generally detectable in upper respiratory specimens during the acute phase of infection. The lowest concentration of SARS-CoV-2 viral copies this assay can detect is 138 copies/mL. A negative result does not preclude SARS-Cov-2 infection and should not be used as the sole basis for treatment or other patient management decisions. A negative result may occur with  improper specimen collection/handling, submission of specimen other than nasopharyngeal swab, presence of viral mutation(s) within the areas targeted by this assay, and inadequate number of viral copies(<138 copies/mL). A negative result must be combined with clinical observations, patient history, and epidemiological information. The expected result is Negative.  Fact Sheet for Patients:  BloggerCourse.com  Fact Sheet for Healthcare Providers:  SeriousBroker.it  This test is no t yet approved or  cleared by the [place] FDA and  has been authorized for detection and/or diagnosis of SARS-CoV-2 by FDA under an Emergency Use Authorization (EUA). This EUA will remain  in effect (meaning this test can be used) for the duration of the COVID-19 declaration under Section 564(b)(1) of the Act, 21 U.S.C.section 360bbb-3(b)(1), unless the authorization is terminated  or revoked sooner.       Influenza A by PCR NEGATIVE NEGATIVE   Influenza B by PCR NEGATIVE NEGATIVE    Comment: (NOTE) The Xpert Xpress SARS-CoV-2/FLU/RSV plus assay is intended as an aid in the diagnosis of influenza from Nasopharyngeal swab specimens and should not be used as a sole basis for treatment. Nasal washings and aspirates are unacceptable for Xpert Xpress SARS-CoV-2/FLU/RSV testing.  Fact Sheet for Patients: BloggerCourse.com  Fact Sheet for Healthcare Providers: SeriousBroker.it  This test is not yet approved or cleared by the [place] FDA and has been authorized for detection and/or diagnosis of SARS-CoV-2 by FDA under an Emergency Use Authorization (EUA). This EUA will remain in effect (meaning this test can be used) for the duration of the COVID-19 declaration under Section 564(b)(1) of the Act, 21 U.S.C. section 360bbb-3(b)(1), unless the authorization is terminated or revoked.  Performed at [facility], [address] [place], [place] [address]   HIV Antibody (routine testing w rflx)     Status: None   Collection Time: 11/11/20  4:16 AM  Result Value Ref Range   HIV Screen 4th Generation wRfx Non Reactive Non Reactive    Comment: Performed at [facility], [address], [place], [place] [address]  CBC     Status: Abnormal   Collection Time: 11/11/20  4:16 AM  Result Value  Ref Range   WBC 3.3 (L) 4.0 - 10.5 K/uL   RBC 3.48 (L) 3.87 - 5.11 MIL/uL   Hemoglobin 8.8 (L) 12.0 - 15.0 g/dL   HCT 29.5 (L) 62.1 - 30.8 %    MCV 81.3 80.0 - 100.0 fL   MCH 25.3 (L) 26.0 - 34.0 pg   MCHC 31.1 30.0 - 36.0 g/dL   RDW 65.7 84.6 - 96.2 %   Platelets 165 150 - 400 K/uL   nRBC 0.0 0.0 - 0.2 %    Comment: Performed at [facility], [address], [place], [place] [address]  Creatinine, serum     Status: None   Collection Time: 11/11/20  4:16 AM  Result Value Ref Range   Creatinine, Ser 0.90 0.44 - 1.00 mg/dL   GFR, Estimated >13 >24 mL/min    Comment: (NOTE) Calculated using the CKD-EPI Creatinine Equation (2021) Performed at [facility], [address] [place], [place] [address]   Lipid panel     Status: None   Collection Time: 11/11/20  4:16 AM  Result Value Ref Range   Cholesterol 123 0 - 200 mg/dL   Triglycerides 72 <725 mg/dL   HDL 41 >36 mg/dL   Total CHOL/HDL Ratio 3.0 RATIO   VLDL 14 0 - 40 mg/dL   LDL Cholesterol 68 0 - 99 mg/dL    Comment:        Total Cholesterol/HDL:CHD Risk Coronary Heart Disease Risk Table                     Men   Women  1/2 Average Risk   3.4   3.3  Average Risk       5.0   4.4  2 X Average Risk   9.6   7.1  3 X Average Risk  23.4   11.0        Use the calculated Patient Ratio above and the CHD Risk Table to determine the patient's CHD Risk.        ATP III CLASSIFICATION (LDL):  <100     mg/dL   Optimal  644-034  mg/dL   Near or Above                    Optimal  130-159  mg/dL   Borderline  742-595  mg/dL   High  >638     mg/dL   Very High Performed at [facility], [address] [place], [place] [address]     DG Chest 2 View  Result Date: 11/10/2020 CLINICAL DATA:  Weakness and unsteady gait. EXAM: CHEST - 2 VIEW COMPARISON:  02/20/2018 FINDINGS: Postoperative changes in the mediastinum. Heart size and pulmonary vascularity are normal. Lungs are clear. No pleural effusions. No pneumothorax. Mediastinal contours appear intact. IMPRESSION: No active cardiopulmonary disease. Electronically Signed   By: Burman Nieves M.D.   On: 11/10/2020  18:04   CT Head Wo Contrast  Result Date: 11/10/2020 CLINICAL DATA:  Generalized weakness for 1 week. Shaky feeling with unsteady gait. Headache. Mental status change. EXAM: CT HEAD WITHOUT CONTRAST TECHNIQUE: Contiguous axial images were obtained from the base of the skull through the vertex without intravenous contrast. COMPARISON:  04/19/2008 FINDINGS: Brain: No evidence of acute infarction, hemorrhage, hydrocephalus, extra-axial collection or mass lesion/mass effect. Mild diffuse cerebral atrophy. Patchy low-attenuation changes in the deep white matter consistent with small vessel ischemic changes. Vascular: Moderate intracranial arterial vascular calcifications. Skull: Calvarium  appears intact. Sinuses/Orbits: Paranasal sinuses and mastoid air cells are clear. Other: None. IMPRESSION: No acute intracranial abnormalities. Mild chronic atrophy and small vessel ischemic changes. Electronically Signed   By: Burman Nieves M.D.   On: 11/10/2020 18:04   CT Cervical Spine Wo Contrast  Result Date: 11/10/2020 CLINICAL DATA:  Neck trauma EXAM: CT CERVICAL SPINE WITHOUT CONTRAST TECHNIQUE: Multidetector CT imaging of the cervical spine was performed without intravenous contrast. Multiplanar CT image reconstructions were also generated. COMPARISON:  X-ray 09/17/2016 FINDINGS: Alignment: Facet joints are aligned without dislocation or traumatic listhesis. Dens and lateral masses are aligned. Skull base and vertebrae: Minimally displaced fracture involving the posterior arch of C1 on the left (series 3, image 26). Fracture margins do not appear sclerotic. No additional fractures. No lytic or sclerotic bone lesion identified. Soft tissues and spinal canal: No prevertebral fluid or swelling. No visible canal hematoma. Disc levels: Minimal disc height loss at C5-6. No significant facet arthropathy. Upper chest: Included lung apices are clear. Other: Bilateral carotid and vertebral artery atherosclerosis. IMPRESSION:  Acute-appearing minimally displaced fracture involving the posterior arch of C1 on the left. Recommend cervical spine MRI to evaluate for any potential ligamentous injury. These results were called by telephone at the time of interpretation on 11/10/2020 at 7:45 pm to provider [facility] , who verbally acknowledged these results. Electronically Signed   By: Duanne Guess D.O.   On: 11/10/2020 19:45   MR ANGIO HEAD WO CONTRAST  Result Date: 11/11/2020 CLINICAL DATA:  Stroke follow-up. Small acute white matter infarct in the posterior right frontal lobe on MRI yesterday. EXAM: MRA HEAD WITHOUT CONTRAST TECHNIQUE: Angiographic images of the Circle of Willis were acquired using MRA technique without intravenous contrast. COMPARISON:  No pertinent prior exam. FINDINGS: Anterior circulation: The internal carotid arteries are widely patent from skull base to carotid termini. ACAs and MCAs are patent without evidence of a proximal branch occlusion or significant proximal stenosis. No aneurysm is identified. Posterior circulation: The visualized distal vertebral arteries are widely patent to the basilar and codominant. Patent right PICA, bilateral AICA, and bilateral SCA origins are identified. The basilar artery is widely patent. There are large posterior communicating arteries bilaterally. The left P1 segment is hypoplastic. Both PCAs are patent without evidence of a significant proximal stenosis. No aneurysm is identified. Anatomic variants: Fetal left PCA. IMPRESSION: Negative head MRA. Electronically Signed   By: Sebastian Ache M.D.   On: 11/11/2020 09:46   MR Brain W and Wo Contrast  Result Date: 11/11/2020 CLINICAL DATA:  Tremors and weakness EXAM: MRI HEAD WITHOUT AND WITH CONTRAST TECHNIQUE: Multiplanar, multiecho pulse sequences of the brain and surrounding structures were obtained without and with intravenous contrast. CONTRAST:  19mL GADAVIST GADOBUTROL 1 MMOL/ML IV SOLN COMPARISON:  None. FINDINGS:  Brain: Small focus of abnormal diffusion restriction within the right frontal corona radiata, near the base of the precentral gyrus. Fewer than 5 scattered microhemorrhages in a nonspecific pattern. There is multifocal hyperintense T2-weighted signal within the white matter. Parenchymal volume and CSF spaces are normal. The midline structures are normal. No abnormal contrast enhancement. Vascular: Major flow voids are preserved. Skull and upper cervical spine: Normal calvarium and skull base. Visualized upper cervical spine and soft tissues are normal. Sinuses/Orbits:No paranasal sinus fluid levels or advanced mucosal thickening. No mastoid or middle ear effusion. Normal orbits. IMPRESSION: 1. Small focus of acute ischemia within the right frontal corona radiata, near the base of the precentral gyrus. No hemorrhage or mass effect.  2. Findings of chronic microvascular ischemia. Electronically Signed   By: Deatra Robinson M.D.   On: 11/11/2020 00:01   MR Cervical Spine W or Wo Contrast  Result Date: 11/11/2020 CLINICAL DATA:  Weakness and tremors EXAM: MRI CERVICAL SPINE WITHOUT AND WITH CONTRAST TECHNIQUE: Multiplanar and multiecho pulse sequences of the cervical spine, to include the craniocervical junction and cervicothoracic junction, were obtained without and with intravenous contrast. CONTRAST:  53mL GADAVIST GADOBUTROL 1 MMOL/ML IV SOLN COMPARISON:  None. FINDINGS: Alignment: Normal Vertebrae: Mild nonspecific heterogeneity of the bone marrow signal. No focal abnormality. Cord: Normal Posterior Fossa, vertebral arteries, paraspinal tissues: Negative Disc levels: C1-C2: Normal. C2-C3: Normal disc space and facets. No spinal canal or neuroforaminal stenosis. C3-C4: Small disc bulge. Mild spinal canal narrowing. No foraminal stenosis. C4-C5: Small disc bulge with uncovertebral spurring. Mild bilateral foraminal stenosis. No spinal canal stenosis. C5-C6: Small left asymmetric disc bulge. No spinal canal stenosis.  Moderate left foraminal stenosis. C6-C7: No disc herniation or stenosis. C7-T1: Normal disc space and facets. No spinal canal or neuroforaminal stenosis. IMPRESSION: 1. No acute abnormality of the cervical spine. 2. Mild spinal canal stenosis at C3-C4 secondary to small disc bulge. 3. Moderate left C5-C6 neural foraminal stenosis. 4. Mild bilateral C4-C5 neural foraminal stenosis. Electronically Signed   By: Deatra Robinson M.D.   On: 11/11/2020 00:04   ECHOCARDIOGRAM COMPLETE  Result Date: 11/11/2020    ECHOCARDIOGRAM REPORT   Patient Name:   Diana [name] Date of Exam: 11/11/2020 Medical Rec #:  [id number]      Height:       62.0 in Accession #:    [id number]     Weight:       150.0 lb Date of Birth:  03-12-1956       BSA:          1.692 m Patient Age:    64 years       BP:           111/70 mmHg Patient Gender: F              HR:           70 bpm. Exam Location:  Inpatient Procedure: 2D Echo, Color Doppler and Cardiac Doppler Indications:    Stroke  History:        Patient has prior history of Echocardiogram examinations, most                 recent 03/15/2019. Previous Myocardial Infarction and CAD; Risk                 Factors:Hypertension and Dyslipidemia.  Sonographer:    Eulah Pont RDCS Referring Phys: [phone] Meryle Ready [facility] IMPRESSIONS  1. Left ventricular ejection fraction, by estimation, is 60 to 65%. The left ventricle has normal function. The left ventricle has no regional wall motion abnormalities. Left ventricular diastolic parameters were normal.  2. Right ventricular systolic function is normal. The right ventricular size is normal. There is normal pulmonary artery systolic pressure.  3. The mitral valve is normal in structure. Mild mitral valve regurgitation. No evidence of mitral stenosis.  4. The aortic valve is tricuspid. Aortic valve regurgitation is trivial. No aortic stenosis is present.  5. The inferior vena cava is normal in size with greater than 50% respiratory variability,  suggesting right atrial pressure of 3 mmHg. FINDINGS  Left Ventricle: Left ventricular ejection fraction, by estimation, is 60 to 65%. The left ventricle has normal function. The  left ventricle has no regional wall motion abnormalities. The left ventricular internal cavity size was normal in size. There is  no left ventricular hypertrophy. Left ventricular diastolic parameters were normal. Right Ventricle: The right ventricular size is normal. Right ventricular systolic function is normal. There is normal pulmonary artery systolic pressure. The tricuspid regurgitant velocity is 2.26 m/s, and with an assumed right atrial pressure of 3 mmHg,  the estimated right ventricular systolic pressure is 23.4 mmHg. Left Atrium: Left atrial size was normal in size. Right Atrium: Right atrial size was normal in size. Pericardium: There is no evidence of pericardial effusion. Mitral Valve: The mitral valve is normal in structure. Mild mitral valve regurgitation. No evidence of mitral valve stenosis. Tricuspid Valve: The tricuspid valve is normal in structure. Tricuspid valve regurgitation is mild . No evidence of tricuspid stenosis. Aortic Valve: The aortic valve is tricuspid. Aortic valve regurgitation is trivial. No aortic stenosis is present. Pulmonic Valve: The pulmonic valve was not well visualized. Pulmonic valve regurgitation is mild. No evidence of pulmonic stenosis. Aorta: The aortic root is normal in size and structure. Venous: The inferior vena cava is normal in size with greater than 50% respiratory variability, suggesting right atrial pressure of 3 mmHg. IAS/Shunts: No atrial level shunt detected by color flow Doppler.  LEFT VENTRICLE PLAX 2D LVIDd:         3.60 cm  Diastology LVIDs:         2.50 cm  LV e' medial:    9.11 cm/s LV PW:         0.90 cm  LV E/e' medial:  9.0 LV IVS:        1.20 cm  LV e' lateral:   11.50 cm/s LVOT diam:     2.10 cm  LV E/e' lateral: 7.1 LV SV:         57 LV SV Index:   34 LVOT Area:      3.46 cm  RIGHT VENTRICLE RV S prime:     10.30 cm/s TAPSE (M-mode): 1.7 cm LEFT ATRIUM             Index       RIGHT ATRIUM           Index LA diam:        2.40 cm 1.42 cm/m  RA Area:     12.80 cm LA Vol (A2C):   46.9 ml 27.72 ml/m RA Volume:   29.70 ml  17.56 ml/m LA Vol (A4C):   41.0 ml 24.24 ml/m LA Biplane Vol: 44.1 ml 26.07 ml/m  AORTIC VALVE LVOT Vmax:   88.90 cm/s LVOT Vmean:  61.500 cm/s LVOT VTI:    0.166 m  AORTA Ao Root diam: 2.90 cm Ao Asc diam:  3.20 cm MITRAL VALVE               TRICUSPID VALVE MV Area (PHT): 3.65 cm    TR Peak grad:   20.4 mmHg MV Decel Time: 208 msec    TR Vmax:        226.00 cm/s MV E velocity: 81.80 cm/s MV A velocity: 93.40 cm/s  SHUNTS MV E/A ratio:  0.88        Systemic VTI:  0.17 m                            Systemic Diam: 2.10 cm Olga Millers MD Electronically signed by Olga Millers MD Signature Date/Time: 11/11/2020/1:01:00  PM    Final    VAS US CAROTID  Result Date: 11/11/2020 Carotid Arterial Duplex Study Patient Name:  Diana [name]  Date of Exam:   11/11/2020 Medical Rec #: [id number]       Accession #:    [id number] Date of Birth: April 24, 1956        Patient Gender: F Patient Age:   64Y Exam Location:  [facility] Procedure:      VAS US CAROTID Referring Phys: [id number] Marvel Plan --------------------------------------------------------------------------------  Indications:  CVA. Risk Factors: Hypertension, hyperlipidemia, coronary artery disease. Performing Technologist: Argentina Ponder RVS  Examination Guidelines: A complete evaluation includes B-mode imaging, spectral Doppler, color Doppler, and power Doppler as needed of all accessible portions of each vessel. Bilateral testing is considered an integral part of a complete examination. Limited examinations for reoccurring indications may be performed as noted.  Right Carotid Findings: +----------+--------+--------+--------+------------------+--------+           PSV cm/sEDV cm/sStenosisPlaque  DescriptionComments +----------+--------+--------+--------+------------------+--------+ CCA Prox  75      16              heterogenous               +----------+--------+--------+--------+------------------+--------+ CCA Distal67      16              heterogenous               +----------+--------+--------+--------+------------------+--------+ ICA Prox  61      21      1-39%   heterogenous               +----------+--------+--------+--------+------------------+--------+ ICA Distal58      19                                         +----------+--------+--------+--------+------------------+--------+ ECA       83                                                 +----------+--------+--------+--------+------------------+--------+ +----------+--------+-------+--------+-------------------+           PSV cm/sEDV cmsDescribeArm Pressure (mmHG) +----------+--------+-------+--------+-------------------+ [id number]                                         +----------+--------+-------+--------+-------------------+ +---------+--------+--+--------+--+---------+ VertebralPSV cm/s51EDV cm/s12Antegrade +---------+--------+--+--------+--+---------+  Left Carotid Findings: +----------+--------+--------+--------+------------------+--------+           PSV cm/sEDV cm/sStenosisPlaque DescriptionComments +----------+--------+--------+--------+------------------+--------+ CCA Prox  62      16              heterogenous               +----------+--------+--------+--------+------------------+--------+ CCA Distal77      25              heterogenous               +----------+--------+--------+--------+------------------+--------+ ICA Prox  63      18      1-39%   heterogenous               +----------+--------+--------+--------+------------------+--------+ ICA Distal79      29                                          +----------+--------+--------+--------+------------------+--------+  ECA       103     11                                         +----------+--------+--------+--------+------------------+--------+ +----------+--------+--------+--------+-------------------+           PSV cm/sEDV cm/sDescribeArm Pressure (mmHG) +----------+--------+--------+--------+-------------------+ [id number]                                          +----------+--------+--------+--------+-------------------+ +---------+--------+--------+---------------+ VertebralPSV cm/sEDV cm/sBi- directional +---------+--------+--------+---------------+   Summary: Right Carotid: Velocities in the right ICA are consistent with a 1-39% stenosis. Left Carotid: Velocities in the left ICA are consistent with a 1-39% stenosis. Vertebrals: Right vertebral artery demonstrates antegrade flow. Left vertebral             artery demonstrates bidirectional flow. *See table(s) above for measurements and observations.     Preliminary     Review of Systems  Constitutional:  Positive for activity change.  HENT: Negative.    Eyes: Negative.   Respiratory: Negative.    Cardiovascular: Negative.   Gastrointestinal: Negative.   Endocrine: Negative.   Genitourinary: Negative.   Musculoskeletal:  Positive for neck pain.  Allergic/Immunologic: Negative.   Neurological:  Positive for weakness.       Frequent falls  Hematological: Negative.   Psychiatric/Behavioral: Negative.    Blood pressure 133/78, pulse 69, temperature 98.1 F (36.7 C), resp. rate 17, height  (1.575 m), weight 68 kg, SpO2 100 %. Physical Exam Constitutional:      Appearance: She is well-developed.  HENT:     Head: Normocephalic and atraumatic.  Eyes:     Extraocular Movements: Extraocular movements intact.     Pupils: Pupils are equal, round, and reactive to light.  Pulmonary:     Effort: Pulmonary effort is normal.     Breath sounds: Normal breath sounds.   Abdominal:     General: Bowel sounds are normal.     Palpations: Abdomen is soft.  Musculoskeletal:        General: Normal range of motion.     Cervical back: Normal range of motion and neck supple.  Skin:    General: Skin is warm and dry.  Neurological:     Mental Status: She is alert.     Comments: Patient is awake and alert and coherent.  He is oriented to the time place and person.  Her motor function upper extremity demonstrates good strength in the deltoids biceps triceps grips and intrinsics all graded 4+ out of 5 tone and bulk appear intact deep tendon reflexes are 1+ in the biceps 1+ and triceps lower extremity strength is also good to confrontational testing and reflexes are 1+ in the patellae and the Achilles both Babinski responses equivocal in either lower extremity.  There is no evidence of a Hoffmann's.  Cranial nerve examination appears intact.  Psychiatric:        Mood and Affect: Mood normal.        Speech: Speech normal.        Behavior: Behavior normal.    Assessment/Plan: History of multiple falls in the recent past without any evidence of an acute injury in the cervical spine.  Possible recent stroke.  No neurosurgical intervention is needed at this time and patient can be  mobilized.  Work-up and follow-up by neurology would be suggested as the patient may have had a recent TIA or small stroke that is completed.  Shary KeyHenry J Demetries Coia 11/11/2020, 2:33 PM

## 2020-11-11 NOTE — ED Notes (Signed)
MD Lars Masson paged for pt c/o of pain in back and shoulder unrelieved by Tylenol.

## 2020-11-12 ENCOUNTER — Telehealth: Payer: Self-pay | Admitting: *Deleted

## 2020-11-12 LAB — HEMOGLOBIN A1C
Hgb A1c MFr Bld: 5.7 % — ABNORMAL HIGH (ref 4.8–5.6)
Mean Plasma Glucose: 117 mg/dL

## 2020-11-12 NOTE — Telephone Encounter (Signed)
Transition Care Management Unsuccessful Follow-up Telephone Call  Date of discharge and from where:  11/11/2020 - [facility]  Attempts:  1st Attempt  Reason for unsuccessful TCM follow-up call:  Unable to leave message

## 2020-11-13 ENCOUNTER — Ambulatory Visit: Payer: Medicare Other | Admitting: Physical Therapy

## 2020-11-13 ENCOUNTER — Other Ambulatory Visit: Payer: Self-pay

## 2020-11-13 DIAGNOSIS — R2681 Unsteadiness on feet: Secondary | ICD-10-CM

## 2020-11-13 DIAGNOSIS — R296 Repeated falls: Secondary | ICD-10-CM | POA: Diagnosis not present

## 2020-11-13 DIAGNOSIS — G8929 Other chronic pain: Secondary | ICD-10-CM

## 2020-11-13 DIAGNOSIS — R29898 Other symptoms and signs involving the musculoskeletal system: Secondary | ICD-10-CM

## 2020-11-13 DIAGNOSIS — M6281 Muscle weakness (generalized): Secondary | ICD-10-CM

## 2020-11-13 NOTE — Therapy (Signed)
[facility] [address]  [address] [place], [place], [address] Phone: [phone]   Fax:  [phone]  Physical Therapy Treatment  Patient Details  Name: Diana [name] MRN: [id number] Date of Birth: 1956-04-06 Referring Provider (PT): Porfirio Oar, [place]   Encounter Date: 11/13/2020   PT End of Session - 11/13/20 1535     Visit Number 3    Number of Visits 16    Date for PT Re-Evaluation 12/16/20    Authorization Type UHC Medicare & Medicaid [place]    PT Start Time 1535    PT Stop Time 1639    PT Time Calculation (min) 64 min    Activity Tolerance Patient tolerated treatment well    Behavior During Therapy [facility] for tasks assessed/performed             Past Medical History:  Diagnosis Date   Allergy    Arthritis    feet   Bronchitis 10/30/2016   Carpal tunnel syndrome    Coronary artery disease    GERD (gastroesophageal reflux disease)    Hyperlipidemia    Hypertension    Non-ST elevation (NSTEMI) myocardial infarction (HCC) 10/30/2016   NSTEMI (non-ST elevated myocardial infarction) (HCC) 10/30/2016    Past Surgical History:  Procedure Laterality Date   CARPAL TUNNEL RELEASE Left 02/12/2016   Procedure: LEFT CARPAL TUNNEL RELEASE;  Surgeon: Cindee Salt, MD;  Location: [facility];  Service: Orthopedics;  Laterality: Left;  FAB   CORONARY ARTERY BYPASS GRAFT N/A 01/10/2018   Procedure: CORONARY ARTERY BYPASS GRAFTING (CABG) TIMES 4, USING LEFT INTERNAL MAMMARY ARTERY TO OM1  AND ENDOSCOPICALLY HARVESTED RIGHT SAPHENOUS VEIN TO DIAGONAL, AND SEQUENTIALLY TO OM2 AND DISTAL CIRCUMFLEX;  Surgeon: Delight Ovens, MD;  Location: [facility] OR;  Service: Open Heart Surgery;  Laterality: N/A;   LEFT HEART CATH AND CORONARY ANGIOGRAPHY N/A 01/05/2018   Procedure: LEFT HEART CATH AND CORONARY ANGIOGRAPHY;  Surgeon: Lennette Bihari, MD;  Location: [facility] INVASIVE CV LAB;  Service: Cardiovascular;  Laterality: N/A;   TEE WITHOUT  CARDIOVERSION N/A 01/10/2018   Procedure: TRANSESOPHAGEAL ECHOCARDIOGRAM (TEE);  Surgeon: Delight Ovens, MD;  Location: [facility] OR;  Service: Open Heart Surgery;  Laterality: N/A;   TONSILLECTOMY     TUBAL LIGATION     WRIST SURGERY Right     There were no vitals filed for this visit.   Subjective Assessment - 11/13/20 1540     Subjective Pt reports she went to the [facility] ED on Monday due to worsening of her "shakes" and slurring of her speech - she was transferred to [facility] and worked up for what was identified as a small infarct in the R frontal corona radiata. She was sent home with some med changes and was told to continue with her PT.    Pertinent History CABG x 4 - 01/10/18    Patient Stated Goals "to reduce the muscle tightness and feel steadier on my feet"    Currently in Pain? Yes    Pain Score 7     Pain Location Neck   & upper shoulder   Pain Orientation Lower    Pain Descriptors / Indicators Sharp    Pain Type Acute pain;Chronic pain    Pain Score 5    Pain Location Foot    Pain Orientation Right;Left;Upper    Pain Descriptors / Indicators Dull   "dull" on R; "poking" on L   Pain Type Acute pain;Chronic  pain                               [facility] Adult PT Treatment/Exercise - 11/13/20 1535       Knee/Hip Exercises: Stretches   Passive Hamstring Stretch Right;Left;1 rep;30 seconds    Passive Hamstring Stretch Limitations supine with strap & seated hip hinge - pt preferring seated stretch    Hip Flexor Stretch Right;Left;1 rep;30 seconds    Hip Flexor Stretch Limitations seated lunge position over edge of chair    ITB Stretch Right;Left;1 rep;30 seconds    ITB Stretch Limitations lateral lean over counter    Piriformis Stretch Right;Left;1 rep;30 seconds    Piriformis Stretch Limitations side sitting hip hinge    Gastroc Stretch Right;Left;1 rep;30 seconds    Gastroc Stretch Limitations standing runner's stretch at counter      Knee/Hip Exercises:  Aerobic   Recumbent Bike L1 x 6 min      Knee/Hip Exercises: Seated   Ball Squeeze 10 x 3-5"    Clamshell with TheraBand Yellow   alt hip ABD/ER 10 x 3"   Other Seated Knee/Hip Exercises Heel/toe raises 10 x 3"                      PT Short Term Goals - 11/04/20 1537       PT SHORT TERM GOAL #1   Title Patient will be independent with initial HEP    Status On-going    Target Date 11/18/20               PT Long Term Goals - 11/04/20 1537       PT LONG TERM GOAL #1   Title Patient will be independent with ongoing/advanced HEP +/- gym program for self-management at home    Status On-going    Target Date 12/16/20      PT LONG TERM GOAL #2   Title Patient will demonstrate improved B LE strength to >/= 4/5 for improved stability and ease of mobility    Status On-going    Target Date 12/16/20      PT LONG TERM GOAL #3   Title Patient will decrease 5x STS time to </= 11.4 sec per age-appropriate norms    Status On-going    Target Date 12/16/20      PT LONG TERM GOAL #4   Title Patient will improve FGA score to >/= 25/30 to improve gait stability and reduce risk for falls    Status On-going    Target Date 12/16/20      PT LONG TERM GOAL #5   Title Patient will report no further instances of falls    Status On-going    Target Date 12/16/20                   Plan - 11/13/20 1639     Clinical Impression Statement [name] returns to PT today after being evaluated in the ED on Mon/Tues for worsening weakness and slurred speech at which time she was determined to have experienced a TIA due to a small lacunar infarct in the R frontal corona radiata. She reports the MDs made some changes in her meds and discharged her home with instructions to continue PT. Communication with her neurologist, Dr. Marvel Plan, did not indicate any specific new focus for PT or new concerns or precautions. Given her time at the hospital, she did  not have much opportunity to  attempt the HEP provided on her last PT visit, therefore majority of today's session focused on review and clarification of the HEP, with minor modifications made to promote better understanding and tolerance. Will continue to progress LE strengthening and balance training in upcoming visits as well as address ongoing neck and shoulder pain as indicated.    Comorbidities CAD s/p NSTEMI 10/30/16 & CABG x 4 01/10/18; HTN; cervicalgia; carpal tunnel syndrome s/p CTR 02/12/16; GERD; bronchitis    Rehab Potential Good    PT Frequency 2x / week    PT Duration 6 weeks    PT Treatment/Interventions ADLs/Self Care Home Management;Cryotherapy;Electrical Stimulation;Iontophoresis 4mg /ml Dexamethasone;Moist Heat;Ultrasound;Gait training;Stair training;Functional mobility training;Therapeutic activities;Therapeutic exercise;Balance training;Neuromuscular re-education;Patient/family education;Manual techniques;Scar mobilization;Passive range of motion;Dry needling;Energy conservation;Taping;Vasopneumatic Device    PT Next Visit Plan progress LE strengthening as tolerated; introduce balance activities    PT Home Exercise Plan Access Code: [email or web address] (6/7)    Consulted and Agree with Plan of Care Patient             Patient will benefit from skilled therapeutic intervention in order to improve the following deficits and impairments:  Cardiopulmonary status limiting activity, Decreased activity tolerance, Decreased mobility, Decreased range of motion, Decreased scar mobility, Decreased strength, Hypomobility, Increased fascial restricitons, Increased muscle spasms, Impaired perceived functional ability, Impaired flexibility, Impaired UE functional use, Improper body mechanics, Postural dysfunction, Pain, Decreased balance, Decreased endurance, Difficulty walking, Decreased safety awareness  Visit Diagnosis: Repeated falls  Unsteadiness on feet  Muscle weakness (generalized)  Other symptoms and signs involving the  musculoskeletal system  Chronic bilateral low back pain with bilateral sciatica     Problem List Patient Active Problem List   Diagnosis Date Noted   Acute CVA (cerebrovascular accident) (HCC) 11/11/2020   Normocytic anemia 10/15/2019   Dysphagia 09/17/2019   Costochondritis 06/07/2019   Seasonal allergic rhinitis due to pollen 11/03/2018   Arthropathy of cervical facet joint 11/01/2018   Neural foraminal stenosis of cervical spine 11/01/2018   Atypical chest pain 10/24/2018   Adjustment disorder with mixed anxiety and depressed mood 09/05/2018   Insomnia 03/03/2018   S/P CABG x 4 01/10/2018   Hyperlipidemia 01/09/2018   Unstable angina (HCC) 01/05/2018   CAD (coronary artery disease), native coronary artery 12/28/2017   Asymptomatic microscopic hematuria 11/14/2017   Iron deficiency anemia 09/05/2017   Cervicalgia 12/18/2015   Benign essential HTN 04/08/2015   Metatarsalgia of right foot 02/11/2015    02/13/2015, PT, MPT 11/13/2020, 5:33 PM  [facility] [address]  [address] [place], [place], [place] Phone: [phone]   Fax:  [phone]  Name: Diana [name] MRN: Patience Musca Date of Birth: January 12, 1956

## 2020-11-13 NOTE — Telephone Encounter (Signed)
Transition Care Management Follow-up Telephone Call Date of discharge and from where: 11/11/2020 - [facility] How have you been since you were released from the hospital? "I am okay" Any questions or concerns? No  Items Reviewed: Did the pt receive and understand the discharge instructions provided? Yes  Medications obtained and verified? Yes  Other? No  Any new allergies since your discharge? No  Dietary orders reviewed? No Do you have support at home? Yes    Functional Questionnaire: (I = Independent and D = Dependent) ADLs: I  Bathing/Dressing- I  Meal Prep- I  Eating- I  Maintaining continence- I  Transferring/Ambulation- I  Managing Meds- I  Follow up appointments reviewed:  PCP Hospital f/u appt confirmed? No  - PCP outside of [facility] f/u appt confirmed? No  - advised her per her transition summary, she needs to make an appointment with a Neurologist Are transportation arrangements needed? No  If their condition worsens, is the pt aware to call PCP or go to the Emergency Dept.? Yes Was the patient provided with contact information for the PCP's office or ED? Yes Was to pt encouraged to call back with questions or concerns? Yes

## 2020-11-18 ENCOUNTER — Ambulatory Visit: Payer: Medicare Other | Admitting: Physical Therapy

## 2020-11-18 ENCOUNTER — Encounter: Payer: Self-pay | Admitting: Physical Therapy

## 2020-11-18 ENCOUNTER — Other Ambulatory Visit: Payer: Self-pay

## 2020-11-18 DIAGNOSIS — R2681 Unsteadiness on feet: Secondary | ICD-10-CM

## 2020-11-18 DIAGNOSIS — R296 Repeated falls: Secondary | ICD-10-CM | POA: Diagnosis not present

## 2020-11-18 DIAGNOSIS — M5442 Lumbago with sciatica, left side: Secondary | ICD-10-CM

## 2020-11-18 DIAGNOSIS — M6281 Muscle weakness (generalized): Secondary | ICD-10-CM

## 2020-11-18 DIAGNOSIS — G8929 Other chronic pain: Secondary | ICD-10-CM

## 2020-11-18 DIAGNOSIS — R29898 Other symptoms and signs involving the musculoskeletal system: Secondary | ICD-10-CM

## 2020-11-18 NOTE — Therapy (Signed)
[facility] [address]  [place], [place], [address] Phone: [phone]   Fax:  [phone]  Physical Therapy Treatment  Patient Details  Name: Diana [name] MRN: [id number] Date of Birth: 05-02-1956 Referring Provider (PT): Harrison Mons, [place]   Encounter Date: 11/18/2020   PT End of Session - 11/18/20 1521     Visit Number 4    Number of Visits 16    Date for PT Re-Evaluation 12/16/20    Authorization Type UHC Medicare & Medicaid [place]    PT Start Time 1324    PT Stop Time 4010    PT Time Calculation (min) 52 min    Activity Tolerance Patient tolerated treatment well    Behavior During Therapy [facility] for tasks assessed/performed             Past Medical History:  Diagnosis Date   Allergy    Arthritis    feet   Bronchitis 10/30/2016   Carpal tunnel syndrome    Coronary artery disease    GERD (gastroesophageal reflux disease)    Hyperlipidemia    Hypertension    Non-ST elevation (NSTEMI) myocardial infarction ([place]) 10/30/2016   NSTEMI (non-ST elevated myocardial infarction) ([place]) 10/30/2016    Past Surgical History:  Procedure Laterality Date   CARPAL TUNNEL RELEASE Left 02/12/2016   Procedure: LEFT CARPAL TUNNEL RELEASE;  Surgeon: Daryll Brod, MD;  Location: [place];  Service: Orthopedics;  Laterality: Left;  FAB   CORONARY ARTERY BYPASS GRAFT N/A 01/10/2018   Procedure: CORONARY ARTERY BYPASS GRAFTING (CABG) TIMES 4, USING LEFT INTERNAL MAMMARY ARTERY TO OM1  AND ENDOSCOPICALLY HARVESTED RIGHT SAPHENOUS VEIN TO DIAGONAL, AND SEQUENTIALLY TO OM2 AND DISTAL CIRCUMFLEX;  Surgeon: Grace Isaac, MD;  Location: [place];  Service: Open Heart Surgery;  Laterality: N/A;   LEFT HEART CATH AND CORONARY ANGIOGRAPHY N/A 01/05/2018   Procedure: LEFT HEART CATH AND CORONARY ANGIOGRAPHY;  Surgeon: Troy Sine, MD;  Location: [place] CV LAB;  Service: Cardiovascular;  Laterality: N/A;   TEE WITHOUT  CARDIOVERSION N/A 01/10/2018   Procedure: TRANSESOPHAGEAL ECHOCARDIOGRAM (TEE);  Surgeon: Grace Isaac, MD;  Location: [place];  Service: Open Heart Surgery;  Laterality: N/A;   TONSILLECTOMY     TUBAL LIGATION     WRIST SURGERY Right     There were no vitals filed for this visit.   Subjective Assessment - 11/18/20 1530     Subjective Pt brought a posture brace with her to PT today requesting assist in how to don and adjust the brace.    Pertinent History CABG x 4 - 01/10/18    Patient Stated Goals "to reduce the muscle tightness and feel steadier on my feet"    Currently in Pain? Yes    Pain Score 6     Pain Location Leg    Pain Orientation Right;Left    Pain Descriptors / Indicators Aching    Pain Type Chronic pain                               [facility] Adult PT Treatment/Exercise - 11/18/20 1521       Lumbar Exercises: Seated   Other Seated Lumbar Exercises R/L yellow TB pallof press x 10   seated on dynadisc with feet on Airex pad     Knee/Hip Exercises: Aerobic   Recumbent Bike L2 x 6 min  Knee/Hip Exercises: Standing   Heel Raises Both;10 reps;3 seconds    Heel Raises Limitations heel & toes raises with counter support    Hip Flexion Both;10 reps;AROM;Stengthening;Knee bent    Hip Flexion Limitations march with UE support on counter    Hip Abduction Right;Left;10 reps;AROM;Stengthening;Knee straight    Abduction Limitations UE support on counter for balance    Hip Extension Right;Left;5 reps;AROM;Stengthening;Knee straight    Extension Limitations reps limited d/t increased shakiness with fatigue    Functional Squat 10 reps;3 seconds    Functional Squat Limitations counter squat      Shoulder Exercises: Seated   Extension Limitations cues for abd bracing & scap retraction/depression   seated on dynadisc with feet on Airex pad   Row Both;10 reps;Strengthening;Theraband    Theraband Level (Shoulder Row) Level 1 (Yellow)    Row Limitations  cues for abd bracing & scap retraction/depression avoiding shoulder elevation/shrug   seated on dynadisc with feet on Airex pad                     PT Short Term Goals - 11/18/20 1536       PT SHORT TERM GOAL #1   Title Patient will be independent with initial HEP    Status Achieved   11/18/20              PT Long Term Goals - 11/04/20 1537       PT LONG TERM GOAL #1   Title Patient will be independent with ongoing/advanced HEP +/- gym program for self-management at home    Status On-going    Target Date 12/16/20      PT LONG TERM GOAL #2   Title Patient will demonstrate improved B LE strength to >/= 4/5 for improved stability and ease of mobility    Status On-going    Target Date 12/16/20      PT LONG TERM GOAL #3   Title Patient will decrease 5x STS time to </= 11.4 sec per age-appropriate norms    Status On-going    Target Date 12/16/20      PT LONG TERM GOAL #4   Title Patient will improve FGA score to >/= 25/30 to improve gait stability and reduce risk for falls    Status On-going    Target Date 12/16/20      PT LONG TERM GOAL #5   Title Patient will report no further instances of falls    Status On-going    Target Date 12/16/20                   Plan - 11/18/20 1538     Clinical Impression Statement Diana [name] brought a posture brace with her to PT requesting assistance with donning and adjusting the straps on the brace. PT secured brace with appropriate tension to remind her about maintaining good posture without limiting her motion - pt cautioned not to slouch into brace but rather to treat brace as a gentle reminder to keep her shoulders back and scapula engaged. Diana [name] reporting initial HEP going well and denies need for further review today - STG met. Progressed strengthening to standing position today but pt fatiguing quickly and becoming "shakey" on her feet therefore remainder of session shifting focus to core activation/strengthening and  balance in sitting on dynadisc with better tolerance.    Comorbidities CAD s/p NSTEMI 10/30/16 & CABG x 4 01/10/18; HTN; cervicalgia; carpal tunnel syndrome s/p CTR 02/12/16; GERD; bronchitis  Rehab Potential Good    PT Frequency 2x / week    PT Duration 6 weeks    PT Treatment/Interventions ADLs/Self Care Home Management;Cryotherapy;Electrical Stimulation;Iontophoresis 52m/ml Dexamethasone;Moist Heat;Ultrasound;Gait training;Stair training;Functional mobility training;Therapeutic activities;Therapeutic exercise;Balance training;Neuromuscular re-education;Patient/family education;Manual techniques;Scar mobilization;Passive range of motion;Dry needling;Energy conservation;Taping;Vasopneumatic Device    PT Next Visit Plan progress LE strengthening as tolerated; progress balance activities    PT Home Exercise Plan Access Code: [email or web address](6/7)    Consulted and Agree with Plan of Care Patient             Patient will benefit from skilled therapeutic intervention in order to improve the following deficits and impairments:  Cardiopulmonary status limiting activity, Decreased activity tolerance, Decreased mobility, Decreased range of motion, Decreased scar mobility, Decreased strength, Hypomobility, Increased fascial restricitons, Increased muscle spasms, Impaired perceived functional ability, Impaired flexibility, Impaired UE functional use, Improper body mechanics, Postural dysfunction, Pain, Decreased balance, Decreased endurance, Difficulty walking, Decreased safety awareness  Visit Diagnosis: Repeated falls  Unsteadiness on feet  Muscle weakness (generalized)  Other symptoms and signs involving the musculoskeletal system  Chronic bilateral low back pain with bilateral sciatica     Problem List Patient Active Problem List   Diagnosis Date Noted   Acute [place]) [place] 11/11/[place]   Normocytic anemia 10/15/2019   Dysphagia 09/17/2019   Costochondritis 06/07/2019    Seasonal allergic rhinitis due to pollen 11/03/2018   Arthropathy of cervical facet joint 11/01/2018   Neural foraminal stenosis of cervical spine 11/01/2018   Atypical chest pain 10/24/2018   Adjustment disorder with mixed anxiety and depressed mood 09/05/2018   Insomnia 03/03/2018   S/P CABG x 4 01/10/2018   Hyperlipidemia 01/09/2018   Unstable angina (HCC) 01/05/2018   CAD (coronary artery disease), native coronary artery 12/28/2017   Asymptomatic microscopic hematuria 11/14/2017   Iron deficiency anemia 09/05/2017   Cervicalgia 12/18/2015   Benign essential HTN 04/08/2015   Metatarsalgia of right foot 02/11/2015    JPercival Spanish PT, MPT 11/18/2020, 5:57 PM  [facility] [address] [place] [address]Phone: [phone]  Fax:  [phone] Name: Diana KIESLINGMRN: [id number] of Birth: 8Jun 27, 1957

## 2020-11-20 ENCOUNTER — Other Ambulatory Visit: Payer: Self-pay

## 2020-11-20 ENCOUNTER — Ambulatory Visit: Payer: Medicare Other | Admitting: Physical Therapy

## 2020-11-20 ENCOUNTER — Encounter: Payer: Self-pay | Admitting: Physical Therapy

## 2020-11-20 DIAGNOSIS — M6281 Muscle weakness (generalized): Secondary | ICD-10-CM

## 2020-11-20 DIAGNOSIS — M5442 Lumbago with sciatica, left side: Secondary | ICD-10-CM

## 2020-11-20 DIAGNOSIS — R296 Repeated falls: Secondary | ICD-10-CM | POA: Diagnosis not present

## 2020-11-20 DIAGNOSIS — R2681 Unsteadiness on feet: Secondary | ICD-10-CM

## 2020-11-20 DIAGNOSIS — R29898 Other symptoms and signs involving the musculoskeletal system: Secondary | ICD-10-CM

## 2020-11-20 DIAGNOSIS — G8929 Other chronic pain: Secondary | ICD-10-CM

## 2020-11-20 NOTE — Therapy (Addendum)
[facility] [address]  [place], [place], [address] Phone: [phone]   Fax:  [phone]  Physical Therapy Treatment  Patient Details  Name: Diana [name] MRN: [id number] Date of Birth: [birth year] Referring Provider (PT): Harrison Mons, [place]   Encounter Date: 11/20/2020   PT End of Session - 11/20/20 1534     Visit Number 5    Number of Visits 16    Date for PT Re-Evaluation 12/16/20    Authorization Type UHC Medicare & Medicaid [place]    PT Start Time 0768    PT Stop Time 1622    PT Time Calculation (min) 48 min    Activity Tolerance Patient tolerated treatment well    Behavior During Therapy [facility] for tasks assessed/performed             Past Medical History:  Diagnosis Date   Allergy    Arthritis    feet   Bronchitis 10/30/2016   Carpal tunnel syndrome    Coronary artery disease    GERD (gastroesophageal reflux disease)    Hyperlipidemia    Hypertension    Non-ST elevation (NSTEMI) myocardial infarction ([place]) 10/30/2016   NSTEMI (non-ST elevated myocardial infarction) ([place]) 10/30/2016    Past Surgical History:  Procedure Laterality Date   CARPAL TUNNEL RELEASE Left 02/12/2016   Procedure: LEFT CARPAL TUNNEL RELEASE;  Surgeon: Daryll Brod, MD;  Location: [place];  Service: Orthopedics;  Laterality: Left;  FAB   CORONARY ARTERY BYPASS GRAFT N/A 01/10/2018   Procedure: CORONARY ARTERY BYPASS GRAFTING (CABG) TIMES 4, USING LEFT INTERNAL MAMMARY ARTERY TO OM1  AND ENDOSCOPICALLY HARVESTED RIGHT SAPHENOUS VEIN TO DIAGONAL, AND SEQUENTIALLY TO OM2 AND DISTAL CIRCUMFLEX;  Surgeon: Grace Isaac, MD;  Location: [place];  Service: Open Heart Surgery;  Laterality: N/A;   LEFT HEART CATH AND CORONARY ANGIOGRAPHY N/A 01/05/2018   Procedure: LEFT HEART CATH AND CORONARY ANGIOGRAPHY;  Surgeon: Troy Sine, MD;  Location: [place] [facility];  Service: Cardiovascular;  Laterality: N/A;   TEE WITHOUT  CARDIOVERSION N/A 01/10/2018   Procedure: TRANSESOPHAGEAL ECHOCARDIOGRAM (TEE);  Surgeon: Grace Isaac, MD;  Location: [facility];  Service: Open Heart Surgery;  Laterality: N/A;   TONSILLECTOMY     TUBAL LIGATION     WRIST SURGERY Right     There were no vitals filed for this visit.   Subjective Assessment - 11/20/20 1539     Subjective Pt reports her hip is hurting today after bending over to mop her floor earlier today. She states she tried her postural brace aroudn the house yesterday and while walking earlier today - she states she feels like she still needs to get used to it.    Pertinent History CABG x 4 - 01/10/18    Patient Stated Goals "to reduce the muscle tightness and feel steadier on my feet"    Currently in Pain? Yes    Pain Score 5     Pain Location Hip    Pain Orientation Left    Aggravating Factors  mopping earlier today                               [facility] Adult PT Treatment/Exercise - 11/20/20 1534       Lumbar Exercises: Seated   Sit to Stand 5 reps    Sit to Stand Limitations arms crossed on chest - cues  for fwd wt shift shoulders over knees to faciliate fwd momentum into stand      Lumbar Exercises: Supine   Clam 10 reps    Clam Limitations TrA + yellow TB alt hip ABD/ER    Bent Knee Raise 20 reps;3 seconds    Bent Knee Raise Limitations brace marching    Bridge 10 reps;3 seconds    Bridge Limitations + yellow TB hip ABD isometric    Other Supine Lumbar Exercises --      Knee/Hip Exercises: Aerobic   Nustep L4 x 6 min (UE/LE)      Shoulder Exercises: Supine   Horizontal ABduction Both;10 reps;Strengthening;Theraband    Theraband Level (Shoulder Horizontal ABduction) Level 1 (Yellow)    Horizontal ABduction Limitations cues for TrA activation & scap retraction    External Rotation Both;10 reps;Strengthening;Theraband    Theraband Level (Shoulder External Rotation) Level 1 (Yellow)    External Rotation Limitations cues for TrA  activation & scap retraction    Diagonals Both;10 reps;Strengthening;Theraband    Theraband Level (Shoulder Diagonals) Level 1 (Yellow)    Diagonals Limitations cues for TrA activation & scap retraction                    PT Education - 11/20/20 1618     Education Details HEP update - core & proximal UE/LE strengthening - Access Code: [email or web address]    Person(s) Educated Patient    Methods Explanation;Demonstration;Verbal cues;Tactile cues;Handout    Comprehension Verbalized understanding;Verbal cues required;Tactile cues required;Returned demonstration;Need further instruction              PT Short Term Goals - 11/18/20 1536       PT SHORT TERM GOAL #1   Title Patient will be independent with initial HEP    Status Achieved   11/18/20              PT Long Term Goals - 11/04/20 1537       PT LONG TERM GOAL #1   Title Patient will be independent with ongoing/advanced HEP +/- gym program for self-management at home    Status On-going    Target Date 12/16/20      PT LONG TERM GOAL #2   Title Patient will demonstrate improved B LE strength to >/= 4/5 for improved stability and ease of mobility    Status On-going    Target Date 12/16/20      PT LONG TERM GOAL #3   Title Patient will decrease 5x STS time to </= 11.4 sec per age-appropriate norms    Status On-going    Target Date 12/16/20      PT LONG TERM GOAL #4   Title Patient will improve FGA score to >/= 25/30 to improve gait stability and reduce risk for falls    Status On-going    Target Date 12/16/20      PT LONG TERM GOAL #5   Title Patient will report no further instances of falls    Status On-going    Target Date 12/16/20                   Plan - 11/20/20 1543     Clinical Impression Statement Diana [name] reports HEP still going well with no need for review/update - STG met. Given fatigue with standing exercises last session, shifted focus to core and proximal UE/LE strengthening in  hooklying for most of visit, alternating between upper and lower body to minimize fatigue - pt reporting  good tolerance and requesting home instructions for these exercises. Worked on functional strengthening with sit <> stand w/o UE assist, providing cues for rocking/weight shift to create momentum and promote proper alignment for most effective muscle activation - pt able to complete 5 reps with good technique but mild shakiness noted with fatigue on final rep.    Comorbidities CAD s/p NSTEMI 10/30/16 & CABG x 4 01/10/18; HTN; cervicalgia; carpal tunnel syndrome s/p CTR 02/12/16; GERD; bronchitis    Rehab Potential Good    PT Frequency 2x / week    PT Duration 6 weeks    PT Treatment/Interventions ADLs/Self Care Home Management;Cryotherapy;Electrical Stimulation;Iontophoresis 72m/ml Dexamethasone;Moist Heat;Ultrasound;Gait training;Stair training;Functional mobility training;Therapeutic activities;Therapeutic exercise;Balance training;Neuromuscular re-education;Patient/family education;Manual techniques;Scar mobilization;Passive range of motion;Dry needling;Energy conservation;Taping;Vasopneumatic Device    PT Next Visit Plan progress LE strengthening as tolerated; progress balance activities    PT Home Exercise Plan Access Code: [email or web address](6/7), [email or web address](6/23)    Consulted and Agree with Plan of Care Patient             Patient will benefit from skilled therapeutic intervention in order to improve the following deficits and impairments:  Cardiopulmonary status limiting activity, Decreased activity tolerance, Decreased mobility, Decreased range of motion, Decreased scar mobility, Decreased strength, Hypomobility, Increased fascial restricitons, Increased muscle spasms, Impaired perceived functional ability, Impaired flexibility, Impaired UE functional use, Improper body mechanics, Postural dysfunction, Pain, Decreased balance, Decreased endurance, Difficulty walking, Decreased safety awareness  Visit  Diagnosis: Repeated falls  Unsteadiness on feet  Muscle weakness (generalized)  Other symptoms and signs involving the musculoskeletal system  Chronic bilateral low back pain with bilateral sciatica     Problem List Patient Active Problem List   Diagnosis Date Noted   Acute CV[facility]bro[facility]) [facility] 11/11/2020   Normocytic anemia 10/15/2019   Dysphagia 09/17/2019   Costochondritis 06/07/2019   Seasonal allergic rhinitis due to pollen 11/03/2018   Arthropathy of cervical facet joint 11/01/2018   Neural foraminal stenosis of cervical spine 11/01/2018   Atypical chest pain 10/24/2018   Adjustment disorder with mixed anxiety and depressed mood 09/05/2018   Insomnia 03/03/2018   S/P CABG x 4 01/10/2018   Hyperlipidemia 01/09/2018   Unstable angina (HCC) 01/05/2018   CAD (coronary artery disease), native coronary artery 12/28/2017   Asymptomatic microscopic hematuria 11/14/2017   Iron deficiency anemia 09/05/2017   Cervicalgia 12/18/2015   Benign essential HTN 04/08/2015   Metatarsalgia of right foot 02/11/2015    JPercival Spanish PT, MPT 11/20/2020, 7:38 PM  [facility] [address] [place] [address]Phone: [phone]  Fax:  [phone] Name: Diana PIANKAMRN: [id number] of Birth: 809/30/1957

## 2020-11-20 NOTE — Patient Instructions (Signed)
     Access Code: [email or web address] URL: [email or web address] Date: 11/20/2020 Prepared by: Glenetta Hew  Exercises Supine March with Resistance Band - 1 x daily - 7 x weekly - 2 sets - 10 reps - 2-3 sec hold hold Hooklying Isometric Clamshell - 1 x daily - 7 x weekly - 2 sets - 10 reps - 3 sec hold Supine Bridge with Resistance Band - 1 x daily - 7 x weekly - 2 sets - 10 reps - 3-5 sec hold Supine Shoulder Horizontal Abduction with Resistance - 1 x daily - 7 x weekly - 2 sets - 10 reps - 3 sec hold Supine Bilateral Shoulder External Rotation with Resistance - 1 x daily - 7 x weekly - 2 sets - 10 reps - 3 sec hold

## 2020-11-25 ENCOUNTER — Ambulatory Visit: Payer: Medicare Other | Admitting: Physical Therapy

## 2020-11-25 ENCOUNTER — Other Ambulatory Visit: Payer: Self-pay

## 2020-11-25 ENCOUNTER — Encounter: Payer: Self-pay | Admitting: Physical Therapy

## 2020-11-25 DIAGNOSIS — R296 Repeated falls: Secondary | ICD-10-CM | POA: Diagnosis not present

## 2020-11-25 DIAGNOSIS — M6281 Muscle weakness (generalized): Secondary | ICD-10-CM

## 2020-11-25 DIAGNOSIS — G8929 Other chronic pain: Secondary | ICD-10-CM

## 2020-11-25 DIAGNOSIS — R2681 Unsteadiness on feet: Secondary | ICD-10-CM

## 2020-11-25 DIAGNOSIS — R29898 Other symptoms and signs involving the musculoskeletal system: Secondary | ICD-10-CM

## 2020-11-25 DIAGNOSIS — M5441 Lumbago with sciatica, right side: Secondary | ICD-10-CM

## 2020-11-25 NOTE — Therapy (Signed)
[facility] [address]  [place], [place], [address] Phone: [phone]   Fax:  [phone]  Physical Therapy Treatment / Progress Note  Patient Details  Name: Diana [name] MRN: [id number] Date of Birth: 1955-08-29 Referring Provider (PT): Harrison Mons, PA-C  Progress Note  Reporting Period 10/21/2020 to 11/25/2020  See note below for Objective Data and Assessment of Progress/Goals.     Encounter Date: 11/25/2020   PT End of Session - 11/25/20 1530     Visit Number 6    Number of Visits 16    Date for PT Re-Evaluation 12/16/20    Authorization Type UHC Medicare & Medicaid [place]    Progress Note Due on Visit 16   MD PN completed on visit #6 - 11/25/20   PT Start Time 1530    PT Stop Time 1632    PT Time Calculation (min) 62 min    Activity Tolerance Patient tolerated treatment well    Behavior During Therapy [facility] for tasks assessed/performed             Past Medical History:  Diagnosis Date   Allergy    Arthritis    feet   Bronchitis 10/30/2016   Carpal tunnel syndrome    Coronary artery disease    GERD (gastroesophageal reflux disease)    Hyperlipidemia    Hypertension    Non-ST elevation (NSTEMI) myocardial infarction ([place]) 10/30/2016   NSTEMI (non-ST elevated myocardial infarction) ([place]) 10/30/2016    Past Surgical History:  Procedure Laterality Date   CARPAL TUNNEL RELEASE Left 02/12/2016   Procedure: LEFT CARPAL TUNNEL RELEASE;  Surgeon: Daryll Brod, MD;  Location: [facility];  Service: Orthopedics;  Laterality: Left;  FAB   CORONARY ARTERY BYPASS GRAFT N/A 01/10/2018   Procedure: CORONARY ARTERY BYPASS GRAFTING (CABG) TIMES 4, USING LEFT INTERNAL MAMMARY ARTERY TO OM1  AND ENDOSCOPICALLY HARVESTED RIGHT SAPHENOUS VEIN TO DIAGONAL, AND SEQUENTIALLY TO OM2 AND DISTAL CIRCUMFLEX;  Surgeon: Grace Isaac, MD;  Location: [facility];  Service: Open Heart Surgery;  Laterality: N/A;   LEFT  HEART CATH AND CORONARY ANGIOGRAPHY N/A 01/05/2018   Procedure: LEFT HEART CATH AND CORONARY ANGIOGRAPHY;  Surgeon: Troy Sine, MD;  Location: [place] [facility];  Service: Cardiovascular;  Laterality: N/A;   TEE WITHOUT CARDIOVERSION N/A 01/10/2018   Procedure: TRANSESOPHAGEAL ECHOCARDIOGRAM (TEE);  Surgeon: Grace Isaac, MD;  Location: [place] [facility];  Service: Open Heart Surgery;  Laterality: N/A;   TONSILLECTOMY     TUBAL LIGATION     WRIST SURGERY Right     There were no vitals filed for this visit.   Subjective Assessment - 11/25/20 1532     Subjective Pt reports PT has been helping - she doesn't get the cramps as much and her knees are feeling better. She states she still get the "shakes" when she does too much. Also her neck and shoulder will start to hurt more when she has been more active like today.    Pertinent History CABG x 4 - 01/10/18    Patient Stated Goals "to reduce the muscle tightness and feel steadier on my feet"    Currently in Pain? Yes    Pain Score 7     Pain Location Neck    Pain Orientation Right;Left    Pain Descriptors / Indicators Aching    Pain Type Chronic pain  [facility] PT Assessment - 11/25/20 1530       Assessment   Medical Diagnosis Deconditioning & frequent falls    Referring Provider (PT) Harrison Mons, PA-C    Onset Date/Surgical Date --   ~6 months   Next MD Visit 11/26/20      Strength   Right Hip Flexion 4-/5    Right Hip Extension 3-/5    Right Hip External Rotation  4-/5    Right Hip Internal Rotation 4-/5    Right Hip ABduction 4-/5    Right Hip ADduction 3+/5    Left Hip Flexion 4-/5    Left Hip Extension 2+/5    Left Hip External Rotation 3+/5    Left Hip Internal Rotation 3+/5    Left Hip ABduction 3/5    Left Hip ADduction 3-/5    Right Knee Flexion 4/5    Right Knee Extension 4+/5    Left Knee Flexion 4/5    Left Knee Extension 4/5    Right Ankle Dorsiflexion 4-/5    Right Ankle Plantar Flexion 4-/5     Left Ankle Dorsiflexion 4-/5    Left Ankle Plantar Flexion 2/5      Ambulation/Gait   Gait velocity 4.13 ft/sec      Standardized Balance Assessment   Five times sit to stand comments  15.9 sec   norm for ages 65-69 = 11.04 sec   10 Meter Walk 7.94 sec      Timed Up and Go Test   Normal TUG (seconds) 10.62      Functional Gait  Assessment   Gait Level Surface Walks 20 ft in less than 5.5 sec, no assistive devices, good speed, no evidence for imbalance, normal gait pattern, deviates no more than 6 in outside of the 12 in walkway width.    Change in Gait Speed Able to change speed, demonstrates mild gait deviations, deviates 6-10 in outside of the 12 in walkway width, or no gait deviations, unable to achieve a major change in velocity, or uses a change in velocity, or uses an assistive device.    Gait with Horizontal Head Turns Performs head turns smoothly with slight change in gait velocity (eg, minor disruption to smooth gait path), deviates 6-10 in outside 12 in walkway width, or uses an assistive device.    Gait with Vertical Head Turns Performs task with slight change in gait velocity (eg, minor disruption to smooth gait path), deviates 6 - 10 in outside 12 in walkway width or uses assistive device    Gait and Pivot Turn Pivot turns safely within 3 sec and stops quickly with no loss of balance.    Step Over Obstacle Is able to step over 2 stacked shoe boxes taped together (9 in total height) without changing gait speed. No evidence of imbalance.    Gait with Narrow Base of Support Is able to ambulate for 10 steps heel to toe with no staggering.    Gait with Eyes Closed Walks 20 ft, slow speed, abnormal gait pattern, evidence for imbalance, deviates 10-15 in outside 12 in walkway width. Requires more than 9 sec to ambulate 20 ft.    Ambulating Backwards Walks 20 ft, uses assistive device, slower speed, mild gait deviations, deviates 6-10 in outside 12 in walkway width.    Steps  Alternating feet, must use rail.    Total Score 23    FGA comment: 19-24 = medium risk fall  [facility] Adult PT Treatment/Exercise - 11/25/20 1530       Knee/Hip Exercises: Aerobic   Recumbent Bike L2 x 6 min      Manual Therapy   Manual Therapy Soft tissue mobilization    Soft tissue mobilization STM to B lower cervical paraspinals, UT/LS, periscapular muscles and pecs  (L>R)                      PT Short Term Goals - 11/18/20 1536       PT SHORT TERM GOAL #1   Title Patient will be independent with initial HEP    Status Achieved   11/18/20              PT Long Term Goals - 11/25/20 1536       PT LONG TERM GOAL #1   Title Patient will be independent with ongoing/advanced HEP +/- gym program for self-management at home    Status On-going    Target Date 12/16/20      PT LONG TERM GOAL #2   Title Patient will demonstrate improved B LE strength to >/= 4/5 for improved stability and ease of mobility    Status Partially Met    Target Date 12/16/20      PT LONG TERM GOAL #3   Title Patient will decrease 5x STS time to </= 11.4 sec per age-appropriate norms    Baseline 23.09 sec    Status On-going   11/25/20 - reduced to 15.9 sec   Target Date 12/16/20      PT LONG TERM GOAL #4   Title Patient will improve FGA score to >/= 25/30 to improve gait stability and reduce risk for falls    Baseline 18/30    Status On-going   11/25/20 - increased to 23/30   Target Date 12/16/20      PT LONG TERM GOAL #5   Title Patient will report no further instances of falls    Status Partially Met   11/25/20 - no recent falls reported   Target Date 12/16/20                   Plan - 11/25/20 1620     Clinical Impression Statement Tyronda feels that she is improving with PT, noting her knees feel better with decreased muscle cramping but still notes her muscles get "shakey" with fatigue. Her overall LE strength is improving but  weakness still noted proximally in LEs (L>R) as well as L plantarflexion and she remains quick to fatigue with activities. Improvements noted in all balance measures with 5xSTS reduced from 23.09 sec to 15.9 sec, TUG decreased from 12.72 sec to 10.62 sec, gait speed increased from 3.53 ft/sec to 4.13 ft/sec and FGA increased from 18/30 to 23/30, indicating a reduction from a high to medium fall risk. Adalind has met her STG and is demonstrating progress toward all LTGs but will continue to benefit from skilled PT for further strengthening for improved stability and endurance to reduce her fall risk.    Comorbidities CAD s/p NSTEMI 10/30/16 & CABG x 4 01/10/18; HTN; cervicalgia; carpal tunnel syndrome s/p CTR 02/12/16; GERD; bronchitis    Rehab Potential Good    PT Frequency 2x / week    PT Duration 6 weeks    PT Treatment/Interventions ADLs/Self Care Home Management;Cryotherapy;Electrical Stimulation;Iontophoresis 64m/ml Dexamethasone;Moist Heat;Ultrasound;Gait training;Stair training;Functional mobility training;Therapeutic activities;Therapeutic exercise;Balance training;Neuromuscular re-education;Patient/family education;Manual techniques;Scar mobilization;Passive range of motion;Dry needling;Energy conservation;Taping;Vasopneumatic Device    PT  Next Visit Plan progress LE strengthening as tolerated; progress balance activities    PT Home Exercise Plan Access Code: [email or web address] (6/7), [email or web address] (6/23)    Consulted and Agree with Plan of Care Patient             Patient will benefit from skilled therapeutic intervention in order to improve the following deficits and impairments:  Cardiopulmonary status limiting activity, Decreased activity tolerance, Decreased mobility, Decreased range of motion, Decreased scar mobility, Decreased strength, Hypomobility, Increased fascial restricitons, Increased muscle spasms, Impaired perceived functional ability, Impaired flexibility, Impaired UE functional use, Improper  body mechanics, Postural dysfunction, Pain, Decreased balance, Decreased endurance, Difficulty walking, Decreased safety awareness  Visit Diagnosis: Repeated falls  Unsteadiness on feet  Muscle weakness (generalized)  Other symptoms and signs involving the musculoskeletal system  Chronic bilateral low back pain with bilateral sciatica     Problem List Patient Active Problem List   Diagnosis Date Noted   Acute CVA (cerebrovascular accident) ([place]) 11/11/2020   Normocytic anemia 10/15/2019   Dysphagia 09/17/2019   Costochondritis 06/07/2019   Seasonal allergic rhinitis due to pollen 11/03/2018   Arthropathy of cervical facet joint 11/01/2018   Neural foraminal stenosis of cervical spine 11/01/2018   Atypical chest pain 10/24/2018   Adjustment disorder with mixed anxiety and depressed mood 09/05/2018   Insomnia 03/03/2018   S/P CABG x 4 01/10/2018   Hyperlipidemia 01/09/2018   Unstable angina (HCC) 01/05/2018   CAD (coronary artery disease), native coronary artery 12/28/2017   Asymptomatic microscopic hematuria 11/14/2017   Iron deficiency anemia 09/05/2017   Cervicalgia 12/18/2015   Benign essential HTN 04/08/2015   Metatarsalgia of right foot 02/11/2015    Percival Spanish, PT, MPT 11/25/2020, 4:39 PM  [facility] [address]  [place], [place], [address] Phone: [phone]   Fax:  [phone]  Name: DAZJA HOUCHIN MRN: [id number] Date of Birth: 1955/09/03

## 2020-11-27 ENCOUNTER — Ambulatory Visit: Payer: Medicare Other | Admitting: Physical Therapy

## 2020-11-27 ENCOUNTER — Other Ambulatory Visit: Payer: Self-pay

## 2020-11-27 ENCOUNTER — Encounter: Payer: Self-pay | Admitting: Physical Therapy

## 2020-11-27 DIAGNOSIS — R29898 Other symptoms and signs involving the musculoskeletal system: Secondary | ICD-10-CM

## 2020-11-27 DIAGNOSIS — G8929 Other chronic pain: Secondary | ICD-10-CM

## 2020-11-27 DIAGNOSIS — R296 Repeated falls: Secondary | ICD-10-CM | POA: Diagnosis not present

## 2020-11-27 DIAGNOSIS — M6281 Muscle weakness (generalized): Secondary | ICD-10-CM

## 2020-11-27 DIAGNOSIS — R2681 Unsteadiness on feet: Secondary | ICD-10-CM

## 2020-11-27 NOTE — Therapy (Signed)
[facility] [address]  [place], [place], [address] Phone: [phone]   Fax:  [phone]  Physical Therapy Treatment  Patient Details  Name: Diana [name] MRN: [id number] Date of Birth: [birth year] Referring Provider (PT): Harrison Mons, [place]   Encounter Date: 11/27/2020   PT End of Session - 11/27/20 1530     Visit Number 7    Number of Visits 16    Date for PT Re-Evaluation 12/16/20    Authorization Type UHC Medicare & Medicaid [place]    Progress Note Due on Visit 69   MD PN completed on visit #6 - 11/25/20   PT Start Time 1530    PT Stop Time 1614    PT Time Calculation (min) 44 min    Activity Tolerance Patient tolerated treatment well    Behavior During Therapy [facility] for tasks assessed/performed             Past Medical History:  Diagnosis Date   Allergy    Arthritis    feet   Bronchitis 10/30/2016   Carpal tunnel syndrome    Coronary artery disease    GERD (gastroesophageal reflux disease)    Hyperlipidemia    Hypertension    Non-ST elevation (NSTEMI) myocardial infarction ([place]) 10/30/2016   NSTEMI (non-ST elevated myocardial infarction) ([place]) 10/30/2016    Past Surgical History:  Procedure Laterality Date   CARPAL TUNNEL RELEASE Left 02/12/2016   Procedure: LEFT CARPAL TUNNEL RELEASE;  Surgeon: Daryll Brod, MD;  Location: [place];  Service: Orthopedics;  Laterality: Left;  FAB   CORONARY ARTERY BYPASS GRAFT N/A 01/10/2018   Procedure: CORONARY ARTERY BYPASS GRAFTING (CABG) TIMES 4, USING LEFT INTERNAL MAMMARY ARTERY TO OM1  AND ENDOSCOPICALLY HARVESTED RIGHT SAPHENOUS VEIN TO DIAGONAL, AND SEQUENTIALLY TO OM2 AND DISTAL CIRCUMFLEX;  Surgeon: Grace Isaac, MD;  Location: [place];  Service: Open Heart Surgery;  Laterality: N/A;   LEFT HEART CATH AND CORONARY ANGIOGRAPHY N/A 01/05/2018   Procedure: LEFT HEART CATH AND CORONARY ANGIOGRAPHY;  Surgeon: Troy Sine, MD;  Location: [place] [facility];  Service: Cardiovascular;  Laterality: N/A;   TEE WITHOUT CARDIOVERSION N/A 01/10/2018   Procedure: TRANSESOPHAGEAL ECHOCARDIOGRAM (TEE);  Surgeon: Grace Isaac, MD;  Location: [facility];  Service: Open Heart Surgery;  Laterality: N/A;   TONSILLECTOMY     TUBAL LIGATION     WRIST SURGERY Right     There were no vitals filed for this visit.   Subjective Assessment - 11/27/20 1536     Subjective Pt reports her PCP has referred her to see several specialist including ortho, neuro, breast cancer specialist and someone to check her esophagus.    Pertinent History CABG x 4 - 01/10/18    Patient Stated Goals "to reduce the muscle tightness and feel steadier on my feet"    Currently in Pain? Yes    Pain Score 6     Pain Location Neck   & shoulder   Pain Orientation Left;Right   L>R   Pain Descriptors / Indicators Aching    Pain Type Chronic pain                               [facility] Adult PT Treatment/Exercise - 11/27/20 1530       Lumbar Exercises: Seated   Sit to Stand 10 reps    Sit to Stand Limitations  holding yellow (2000 Gr) med ball - cues for fwd wt shift shoulders over knees to faciliate fwd momentum into stand      Knee/Hip Exercises: Aerobic   Recumbent Bike L2 x 6 min      Knee/Hip Exercises: Standing   Hip Extension Right;Left;2 sets;5 sets;Stengthening;Knee straight    Extension Limitations looped yellow TB at ankles for 1st set on R LE but discontinued for remainder as pt feeling like she cannot activate motion on L with band      Knee/Hip Exercises: Seated   Clamshell with TheraBand Red   alt hip ABD/ER 10 x 3"   Other Seated Knee/Hip Exercises R/L Fitter leg press (1 black) x 10    Marching Right;Left;10 reps;Strengthening    Marching Limitations looped red TB at knee with cues for TrA activation      Shoulder Exercises: Seated   Horizontal ABduction Both;10 reps;Strengthening;Theraband    Theraband Level (Shoulder Horizontal  ABduction) Level 1 (Yellow)    Horizontal ABduction Limitations cues for upright posture & scap retraction    External Rotation Both;10 reps;Strengthening;Theraband    External Rotation Limitations cues for upright posture & scap retraction   towel rolls under ebows to maintain neutral shoulders   Diagonals Both;10 reps;Strengthening;Theraband    Theraband Level (Shoulder Diagonals) Level 1 (Yellow)    Diagonals Limitations cues for upright posture & scap retraction      Shoulder Exercises: Standing   Row Both;10 reps;Strengthening;Theraband    Theraband Level (Shoulder Row) Level 1 (Yellow)    Row Limitations cues for TrA activation & scap retraction    Retraction Both;10 reps;Strengthening;Theraband    Theraband Level (Shoulder Retraction) Level 1 (Yellow)    Retraction Limitations cues for TrA activation & scap retraction                      PT Short Term Goals - 11/18/20 1536       PT SHORT TERM GOAL #1   Title Patient will be independent with initial HEP    Status Achieved   11/18/20              PT Long Term Goals - 11/25/20 1536       PT LONG TERM GOAL #1   Title Patient will be independent with ongoing/advanced HEP +/- gym program for self-management at home    Status On-going    Target Date 12/16/20      PT LONG TERM GOAL #2   Title Patient will demonstrate improved B LE strength to >/= 4/5 for improved stability and ease of mobility    Status Partially Met    Target Date 12/16/20      PT LONG TERM GOAL #3   Title Patient will decrease 5x STS time to </= 11.4 sec per age-appropriate norms    Baseline 23.09 sec    Status On-going   11/25/20 - reduced to 15.9 sec   Target Date 12/16/20      PT LONG TERM GOAL #4   Title Patient will improve FGA score to >/= 25/30 to improve gait stability and reduce risk for falls    Baseline 18/30    Status On-going   11/25/20 - increased to 23/30   Target Date 12/16/20      PT LONG TERM GOAL #5   Title  Patient will report no further instances of falls    Status Partially Met   11/25/20 - no recent falls reported   Target  Date 12/16/20                   Plan - 11/27/20 1614     Clinical Impression Statement Diana [name] reports her visit with her PCP went well yesterday but she has been referred to f/u with multiple specialists including neuro to f/u from her CVA and ortho to address her neck. Therapeutic exercises focusing on core activation while progressing LE strengthening with increased resistance and more upright positioning. Diana [name] to fatigue easily with increased shakiness evident 2 fatigue, requiring alternating between upper and lower body exercises/activities as well as limited standing activities and intermittent rest breaks to mitigate fatigue.    Comorbidities CAD s/p NSTEMI 10/30/16 & CABG x 4 01/10/18; HTN; cervicalgia; carpal tunnel syndrome s/p CTR 02/12/16; GERD; bronchitis    Rehab Potential Good    PT Frequency 2x / week    PT Duration 6 weeks    PT Treatment/Interventions ADLs/Self Care Home Management;Cryotherapy;Electrical Stimulation;Iontophoresis 55m/ml Dexamethasone;Moist Heat;Ultrasound;Gait training;Stair training;Functional mobility training;Therapeutic activities;Therapeutic exercise;Balance training;Neuromuscular re-education;Patient/family education;Manual techniques;Scar mobilization;Passive range of motion;Dry needling;Energy conservation;Taping;Vasopneumatic Device    PT Next Visit Plan progress LE strengthening as tolerated; progress balance activities    PT Home Exercise Plan Access Code: [email or web address]), [email or web address](6/23)    Consulted and Agree with Plan of Care Patient             Patient will benefit from skilled therapeutic intervention in order to improve the following deficits and impairments:  Cardiopulmonary status limiting activity, Decreased activity tolerance, Decreased mobility, Decreased range of motion, Decreased scar mobility, Decreased  strength, Hypomobility, Increased fascial restricitons, Increased muscle spasms, Impaired perceived functional ability, Impaired flexibility, Impaired UE functional use, Improper body mechanics, Postural dysfunction, Pain, Decreased balance, Decreased endurance, Difficulty walking, Decreased safety awareness  Visit Diagnosis: Repeated falls  Unsteadiness on feet  Muscle weakness (generalized)  Other symptoms and signs involving the musculoskeletal system  Chronic bilateral low back pain with bilateral sciatica     Problem List Patient Active Problem List   Diagnosis Date Noted   Acute CVA (cerebrovascular accident) ([place] 11/11/2020   Normocytic anemia 10/15/2019   Dysphagia 09/17/2019   Costochondritis 06/07/2019   Seasonal allergic rhinitis due to pollen 11/03/2018   Arthropathy of cervical facet joint 11/01/2018   Neural foraminal stenosis of cervical spine 11/01/2018   Atypical chest pain 10/24/2018   Adjustment disorder with mixed anxiety and depressed mood 09/05/2018   Insomnia 03/03/2018   S/P CABG x 4 01/10/2018   Hyperlipidemia 01/09/2018   Unstable angina (HCC) 01/05/2018   CAD (coronary artery disease), native coronary artery 12/28/2017   Asymptomatic microscopic hematuria 11/14/2017   Iron deficiency anemia 09/05/2017   Cervicalgia 12/18/2015   Benign essential HTN 04/08/2015   Metatarsalgia of right foot 02/11/2015    JPercival Spanish PT, MPT 11/27/2020, 7:25 PM  [facility] [address] [place] [address]Phone: [phone]  Fax:  [phone] Name: PMALIYA MARICHMRN: [id number] of Birth: [birth year]

## 2020-12-02 ENCOUNTER — Encounter: Payer: Medicare Other | Admitting: Physical Therapy

## 2020-12-04 ENCOUNTER — Other Ambulatory Visit: Payer: Self-pay

## 2020-12-04 ENCOUNTER — Encounter: Payer: Self-pay | Admitting: Physical Therapy

## 2020-12-04 ENCOUNTER — Ambulatory Visit: Payer: Medicare Other | Attending: Physician Assistant | Admitting: Physical Therapy

## 2020-12-04 ENCOUNTER — Encounter: Payer: Self-pay | Admitting: Hematology and Oncology

## 2020-12-04 DIAGNOSIS — G8929 Other chronic pain: Secondary | ICD-10-CM | POA: Insufficient documentation

## 2020-12-04 DIAGNOSIS — M542 Cervicalgia: Secondary | ICD-10-CM | POA: Insufficient documentation

## 2020-12-04 DIAGNOSIS — R293 Abnormal posture: Secondary | ICD-10-CM | POA: Insufficient documentation

## 2020-12-04 DIAGNOSIS — M6281 Muscle weakness (generalized): Secondary | ICD-10-CM | POA: Diagnosis present

## 2020-12-04 DIAGNOSIS — R29898 Other symptoms and signs involving the musculoskeletal system: Secondary | ICD-10-CM | POA: Insufficient documentation

## 2020-12-04 DIAGNOSIS — R296 Repeated falls: Secondary | ICD-10-CM

## 2020-12-04 DIAGNOSIS — R2681 Unsteadiness on feet: Secondary | ICD-10-CM | POA: Diagnosis present

## 2020-12-04 DIAGNOSIS — M25561 Pain in right knee: Secondary | ICD-10-CM | POA: Insufficient documentation

## 2020-12-04 DIAGNOSIS — M5441 Lumbago with sciatica, right side: Secondary | ICD-10-CM | POA: Insufficient documentation

## 2020-12-04 DIAGNOSIS — M5442 Lumbago with sciatica, left side: Secondary | ICD-10-CM | POA: Insufficient documentation

## 2020-12-04 NOTE — Therapy (Signed)
[facility] [address]  [place], [place], [address] Phone: [phone]   Fax:  [phone]  Physical Therapy Treatment  Patient Details  Name: Diana [name] MRN: [id number] Date of Birth: 02/19/1956 Referring Provider (PT): Harrison Mons, [place]   Encounter Date: 12/04/2020   PT End of Session - 12/04/20 1530     Visit Number 8    Number of Visits 16    Date for PT Re-Evaluation 12/16/20    Authorization Type [employer]HC Medicare & Medicaid [place]    Progress Note Due on Visit 51   MD PN completed on visit #6 - 11/25/20   PT Start Time 1530    PT Stop Time 1622    PT Time Calculation (min) 52 min    Activity Tolerance Patient tolerated treatment well    Behavior During Therapy [facility] for tasks assessed/performed             Past Medical History:  Diagnosis Date   Allergy    Arthritis    feet   Bronchitis 10/30/2016   Carpal tunnel syndrome    Coronary artery disease    GERD (gastroesophageal reflux disease)    Hyperlipidemia    Hypertension    Non-ST elevation (NSTEMI) myocardial infarction ([place]) 10/30/2016   NSTEMI (non-ST elevated myocardial infarction) ([place]) 10/30/2016    Past Surgical History:  Procedure Laterality Date   CARPAL TUNNEL RELEASE Left 02/12/2016   Procedure: LEFT CARPAL TUNNEL RELEASE;  Surgeon: Daryll Brod, MD;  Location: [facility];  Service: Orthopedics;  Laterality: Left;  FAB   CORONARY ARTERY BYPASS GRAFT N/A 01/10/2018   Procedure: CORONARY ARTERY BYPASS GRAFTING (CABG) TIMES 4, USING LEFT INTERNAL MAMMARY ARTERY TO OM1  AND ENDOSCOPICALLY HARVESTED RIGHT SAPHENOUS VEIN TO DIAGONAL, AND SEQUENTIALLY TO OM2 AND DISTAL CIRCUMFLEX;  Surgeon: Grace Isaac, MD;  Location: [facility];  Service: Open Heart Surgery;  Laterality: N/A;   LEFT HEART CATH AND CORONARY ANGIOGRAPHY N/A 01/05/2018   Procedure: LEFT HEART CATH AND CORONARY ANGIOGRAPHY;  Surgeon: Troy Sine, MD;  Location: [place] [facility];  Service: Cardiovascular;  Laterality: N/A;   TEE WITHOUT CARDIOVERSION N/A 01/10/2018   Procedure: TRANSESOPHAGEAL ECHOCARDIOGRAM (TEE);  Surgeon: Grace Isaac, MD;  Location: [facility];  Service: Open Heart Surgery;  Laterality: N/A;   TONSILLECTOMY     TUBAL LIGATION     WRIST SURGERY Right     There were no vitals filed for this visit.   Subjective Assessment - 12/04/20 1533     Subjective Pt reports she missed her last visit due to a death in the family. Recent visit with hematologist revealing mild anemia and probable dehydration - she was encouraged to drink at least 64 oz of fluid per day.    Pertinent History CABG x 4 - 01/10/18    Patient Stated Goals "to reduce the muscle tightness and feel steadier on my feet"    Currently in Pain? Yes    Pain Score 8     Pain Location Neck   and shoulders   Pain Orientation Left;Right   L>R   Pain Descriptors / Indicators Aching    Pain Type Chronic pain                               [facility] Adult PT Treatment/Exercise - 12/04/20 1530       Neck Exercises:  Seated   Neck Retraction 10 reps;5 secs    Shoulder Rolls Backwards;10 reps      Knee/Hip Exercises: Aerobic   Nustep L4 x 6 min (UE/LE)      Modalities   Modalities Moist Heat      Moist Heat Therapy   Number Minutes Moist Heat 10 Minutes    Moist Heat Location Cervical      Manual Therapy   Manual Therapy Soft tissue mobilization;Myofascial release;Passive ROM    Soft tissue mobilization STM to B lower cervical paraspinals, UT/LS, periscapular muscles and pecs  (L>R)    Myofascial Release manual TPR to L>R UT & LS; gentle suboccipital release    Passive ROM gentle B manual UT & LS stretches      Neck Exercises: Stretches   Upper Trapezius Stretch Right;Left;2 reps;30 seconds    Levator Stretch Right;Left;2 reps;30 seconds                      PT Short Term Goals - 11/18/20 1536       PT SHORT TERM GOAL #1   Title  Patient will be independent with initial HEP    Status Achieved   11/18/20              PT Long Term Goals - 11/25/20 1536       PT LONG TERM GOAL #1   Title Patient will be independent with ongoing/advanced HEP +/- gym program for self-management at home    Status On-going    Target Date 12/16/20      PT LONG TERM GOAL #2   Title Patient will demonstrate improved B LE strength to >/= 4/5 for improved stability and ease of mobility    Status Partially Met    Target Date 12/16/20      PT LONG TERM GOAL #3   Title Patient will decrease 5x STS time to </= 11.4 sec per age-appropriate norms    Baseline 23.09 sec    Status On-going   11/25/20 - reduced to 15.9 sec   Target Date 12/16/20      PT LONG TERM GOAL #4   Title Patient will improve FGA score to >/= 25/30 to improve gait stability and reduce risk for falls    Baseline 18/30    Status On-going   11/25/20 - increased to 23/30   Target Date 12/16/20      PT LONG TERM GOAL #5   Title Patient will report no further instances of falls    Status Partially Met   11/25/20 - no recent falls reported   Target Date 12/16/20                   Plan - 12/04/20 1612     Clinical Impression Statement Diana [name] arriving to PT with severe neck and shoulder pain with frontal headache. She denies any weakness, loss of sensation or changes in mobility or speech, but headache making it hard to concentrate or tolerate any activity. Increased muscle tension evident in B upper shoulders with elevated, rounded shoulders and forward head present. Addressed muscle tension/tightness with manual therapy and review of postural exercises and stretches using mirror feedback for better self-awareness. Diana [name] reported pain significantly decreased following manual therapy and exercises, stating pain down to 4/10 by end of session. Moist heat applied to promote further muscle relaxation and pain reduction. Reinforced continued awareness of posture as well  as home stretching to prevent return of/increase in pain.  Will plan to resume progression of LE strengthening and balance activities as tolerated next session.    Comorbidities CAD s/p NSTEMI 10/30/16 & CABG x 4 01/10/18; HTN; cervicalgia; carpal tunnel syndrome s/p CTR 02/12/16; GERD; bronchitis    Rehab Potential Good    PT Frequency 2x / week    PT Duration 6 weeks    PT Treatment/Interventions ADLs/Self Care Home Management;Cryotherapy;Electrical Stimulation;Iontophoresis 92m/ml Dexamethasone;Moist Heat;Ultrasound;Gait training;Stair training;Functional mobility training;Therapeutic activities;Therapeutic exercise;Balance training;Neuromuscular re-education;Patient/family education;Manual techniques;Scar mobilization;Passive range of motion;Dry needling;Energy conservation;Taping;Vasopneumatic Device    PT Next Visit Plan progress LE strengthening as tolerated; progress balance activities    PT Home Exercise Plan Access Code: [email or web address]/7), [email or web address](6/23)    Consulted and Agree with Plan of Care Patient             Patient will benefit from skilled therapeutic intervention in order to improve the following deficits and impairments:  Cardiopulmonary status limiting activity, Decreased activity tolerance, Decreased mobility, Decreased range of motion, Decreased scar mobility, Decreased strength, Hypomobility, Increased fascial restricitons, Increased muscle spasms, Impaired perceived functional ability, Impaired flexibility, Impaired UE functional use, Improper body mechanics, Postural dysfunction, Pain, Decreased balance, Decreased endurance, Difficulty walking, Decreased safety awareness  Visit Diagnosis: Repeated falls  Unsteadiness on feet  Muscle weakness (generalized)  Other symptoms and signs involving the musculoskeletal system  Chronic bilateral low back pain with bilateral sciatica     Problem List Patient Active Problem List   Diagnosis Date Noted   Acute CVA  (ce[place]brovascular accident) ([place] 11/11/2020   Normocytic anemia 10/15/2019   Dysphagia 09/17/2019   Costochondritis 06/07/2019   Seasonal allergic rhinitis due to pollen 11/03/2018   Arthropathy of cervical facet joint 11/01/2018   Neural foraminal stenosis of cervical spine 11/01/2018   Atypical chest pain 10/24/2018   Adjustment disorder with mixed anxiety and depressed mood 09/05/2018   Insomnia 03/03/2018   S/P CABG x 4 01/10/2018   Hyperlipidemia 01/09/2018   Unstable angina (HCC) 01/05/2018   CAD (coronary artery disease), native coronary artery 12/28/2017   Asymptomatic microscopic hematuria 11/14/2017   Iron deficiency anemia 09/05/2017   Cervicalgia 12/18/2015   Benign essential HTN 04/08/2015   Metatarsalgia of right foot 02/11/2015    JPercival Spanish PT, MPT 12/04/2020, 6:10 PM  [facility] [address] [place] [address]Phone: [phone]  Fax:  [phone] Name: Diana UMALIMRN: [id number] of Birth: 8Apr 19, 1957

## 2020-12-09 ENCOUNTER — Ambulatory Visit: Payer: Medicare Other | Admitting: Physical Therapy

## 2020-12-09 ENCOUNTER — Other Ambulatory Visit: Payer: Self-pay

## 2020-12-09 ENCOUNTER — Encounter: Payer: Self-pay | Admitting: Physical Therapy

## 2020-12-09 DIAGNOSIS — M25561 Pain in right knee: Secondary | ICD-10-CM

## 2020-12-09 DIAGNOSIS — M542 Cervicalgia: Secondary | ICD-10-CM

## 2020-12-09 DIAGNOSIS — R296 Repeated falls: Secondary | ICD-10-CM

## 2020-12-09 DIAGNOSIS — M6281 Muscle weakness (generalized): Secondary | ICD-10-CM

## 2020-12-09 DIAGNOSIS — R2681 Unsteadiness on feet: Secondary | ICD-10-CM

## 2020-12-09 DIAGNOSIS — R293 Abnormal posture: Secondary | ICD-10-CM

## 2020-12-09 DIAGNOSIS — R29898 Other symptoms and signs involving the musculoskeletal system: Secondary | ICD-10-CM

## 2020-12-09 NOTE — Patient Instructions (Signed)
    Access Code: [email or web address] URL: [email or web address] Date: 12/09/2020 Prepared by: Glenetta Hew  Exercises Supine Straight Leg Hip Adduction and Quad Set with Newman Pies - 1 x daily - 7 x weekly - 2 sets - 10 reps - 3-5 sec hold Supine Hip Adduction Isometric with Ball - 2 x daily - 7 x weekly - 2 sets - 10 reps - 5 sec hold

## 2020-12-09 NOTE — Therapy (Addendum)
[facility] [address]  [place], [place], [address] Phone: [phone]   Fax:  [phone]  Physical Therapy Re-Evaluation  Patient Details  Name: Diana [name] MRN: [id number] Date of Birth: Sep 05, 1955 Referring Provider (PT): Melrose Nakayama, MD   Encounter Date: 12/09/2020   PT End of Session - 12/09/20 1530     Visit Number 9    Number of Visits 25    Date for PT Re-Evaluation 02/03/21    Authorization Type UHC Medicare & Medicaid [place]    Progress Note Due on Visit 19   Re-Eval/Recert completed on visit #9 - 12/09/20   PT Start Time 1530    PT Stop Time 1623    PT Time Calculation (min) 53 min    Activity Tolerance Patient tolerated treatment well    Behavior During Therapy [facility] for tasks assessed/performed             Past Medical History:  Diagnosis Date   Allergy    Arthritis    feet   Bronchitis 10/30/2016   Carpal tunnel syndrome    Coronary artery disease    GERD (gastroesophageal reflux disease)    Hyperlipidemia    Hypertension    Non-ST elevation (NSTEMI) myocardial infarction ([place]) 10/30/2016   NSTEMI (non-ST elevated myocardial infarction) ([place]) 10/30/2016    Past Surgical History:  Procedure Laterality Date   CARPAL TUNNEL RELEASE Left 02/12/2016   Procedure: LEFT CARPAL TUNNEL RELEASE;  Surgeon: Daryll Brod, MD;  Location: [place];  Service: Orthopedics;  Laterality: Left;  FAB   CORONARY ARTERY BYPASS GRAFT N/A 01/10/2018   Procedure: CORONARY ARTERY BYPASS GRAFTING (CABG) TIMES 4, USING LEFT INTERNAL MAMMARY ARTERY TO OM1  AND ENDOSCOPICALLY HARVESTED RIGHT SAPHENOUS VEIN TO DIAGONAL, AND SEQUENTIALLY TO OM2 AND DISTAL CIRCUMFLEX;  Surgeon: Grace Isaac, MD;  Location: [place];  Service: Open Heart Surgery;  Laterality: N/A;   LEFT HEART CATH AND CORONARY ANGIOGRAPHY N/A 01/05/2018   Procedure: LEFT HEART CATH AND CORONARY ANGIOGRAPHY;  Surgeon: Troy Sine, MD;   Location: [place] [facility];  Service: Cardiovascular;  Laterality: N/A;   TEE WITHOUT CARDIOVERSION N/A 01/10/2018   Procedure: TRANSESOPHAGEAL ECHOCARDIOGRAM (TEE);  Surgeon: Grace Isaac, MD;  Location: [place];  Service: Open Heart Surgery;  Laterality: N/A;   TONSILLECTOMY     TUBAL LIGATION     WRIST SURGERY Right     There were no vitals filed for this visit.    Subjective Assessment - 12/09/20 1535     Subjective Pt reports she saw the ortho MD, Dr. Rhona Raider, yesterday who wants to add PT for her neck and her knee. Neck pain is chronic since her open heart surgery, while R knee pain has only been an issue for a little more than a month following a fall in the parking lot.    Pertinent History CABG x 4 - 01/10/18    Limitations Sitting;Standing;Walking;House hold activities    How long can you sit comfortably? 15 minutes    How long can you stand comfortably? unsure    How long can you walk comfortably? unsure    Diagnostic tests 11/10/20 - cervical MRI: 1. No acute abnormality of the cervical spine.  2. Mild spinal canal stenosis at C3-C4 secondary to small disc bulge.  3. Moderate left C5-C6 neural foraminal stenosis.  4. Mild bilateral C4-C5 neural foraminal stenosis.    Patient Stated Goals "to reduce the  muscle tightness and feel steadier on my feet"    Currently in Pain? Yes    Pain Score 9     Pain Location Neck    Pain Orientation Upper;Mid    Pain Descriptors / Indicators Dull;Aching    Pain Type Chronic pain    Pain Onset More than a month ago    Effect of Pain on Daily Activities limits reaching overhead, houssehold chores such as sweeping    Pain Score 4    Pain Location Knee    Pain Orientation Right    Pain Descriptors / Indicators Pressure    Pain Type Acute pain    Pain Onset More than a month ago    Aggravating Factors  kneeling, a lot of bending in her knee                [facility] PT Assessment - 12/09/20 1530       Assessment   Medical  Diagnosis Cervical spondylosis; R knee chondromalacia patella    Referring Provider (PT) Melrose Nakayama, MD    Onset Date/Surgical Date --   Chronic for neck; 1-2 months for R knee pain   Hand Dominance Right    Next MD Visit 4-6 wks for Dr. Rhona Raider; next week for Harrison Mons, PA-C (original referring provider)    Prior Therapy mutiple PT episodes      Precautions   Precautions Fall      Prior Function   Level of Independence Independent    Vocation Unemployed;On disability   since surgery   Leisure walking 2 days/wk alternating with aquatic pool exercise 2x/wk, online exercises on occasion      Posture/Postural Control   Posture/Postural Control Postural limitations    Postural Limitations Forward head;Rounded Shoulders      AROM   Right Shoulder Flexion 127 Degrees    Right Shoulder ABduction 122 Degrees    Right Shoulder Internal Rotation --   FIR to L4   Right Shoulder External Rotation --   FER to top of shoulder   Left Shoulder Flexion 128 Degrees    Left Shoulder ABduction 125 Degrees    Left Shoulder Internal Rotation --   FIR to L4   Left Shoulder External Rotation --   FER to top of shoulder   Right Knee Extension -2    Right Knee Flexion 124    Left Knee Extension -2    Left Knee Flexion 129    Cervical Flexion 30    Cervical Extension 18    Cervical - Right Side Bend 19    Cervical - Left Side Bend 8    Cervical - Right Rotation 13    Cervical - Left Rotation 18      Strength   Overall Strength Comments LE MMT as of 11/25/20    Right Shoulder Flexion 3+/5    Right Shoulder ABduction 3+/5    Right Shoulder Internal Rotation 3+/5    Right Shoulder External Rotation 3+/5    Left Shoulder Flexion 3+/5    Left Shoulder ABduction 3+/5    Left Shoulder Internal Rotation 3+/5    Left Shoulder External Rotation 3+/5    Right Hip Flexion 4-/5    Right Hip Extension 3-/5    Right Hip External Rotation  4-/5    Right Hip Internal Rotation 4-/5    Right Hip  ABduction 4-/5    Right Hip ADduction 3+/5    Left Hip Flexion 4-/5    Left Hip Extension  2+/5    Left Hip External Rotation 3+/5    Left Hip Internal Rotation 3+/5    Left Hip ABduction 3/5    Left Hip ADduction 3-/5    Right Knee Flexion 4/5    Right Knee Extension 4+/5    Left Knee Flexion 4/5    Left Knee Extension 4/5    Right Ankle Dorsiflexion 4-/5    Right Ankle Plantar Flexion 4-/5    Left Ankle Dorsiflexion 4-/5    Left Ankle Plantar Flexion 2/5      Flexibility   Soft Tissue Assessment /Muscle Length yes    Hamstrings mild tight R>L    Quadriceps mod tight R>L    ITB mod tight R>L    Piriformis mod tight R>L      Palpation   Patella mobility R patella painful with mobs, limited medial glide   no VMO activation with R quad set   Palpation comment increased mucsle tension and TTP t/o cervical paraspinals, UT and LS                        Objective measurements completed on examination: See above findings.               PT Education - 12/09/20 1620     Education Details Updated POC & HEP - Access Code: [email or web address]    Person(s) Educated Patient    Methods Explanation;Demonstration;Verbal cues;Handout    Comprehension Verbalized understanding;Verbal cues required;Returned demonstration;Need further instruction              PT Short Term Goals - 11/18/20 1536       PT SHORT TERM GOAL #1   Title Patient will be independent with initial HEP    Status Achieved   11/18/20              PT Long Term Goals - 12/09/20 1614       PT LONG TERM GOAL #1   Title Patient will be independent with ongoing/advanced HEP +/- gym program for self-management at home    Status On-going    Target Date 02/03/21      PT LONG TERM GOAL #2   Title Patient will demonstrate improved B LE strength to >/= 4/5 for improved stability and ease of mobility    Status Partially Met    Target Date 02/03/21      PT LONG TERM GOAL #3   Title Patient will  decrease 5x STS time to </= 11.4 sec per age-appropriate norms    Baseline 23.09 sec    Status On-going   11/25/20 - reduced to 15.9 sec   Target Date 02/03/21      PT LONG TERM GOAL #4   Title Patient will improve FGA score to >/= 25/30 to improve gait stability and reduce risk for falls    Baseline 18/30    Status On-going   11/25/20 - increased to 23/30   Target Date 02/03/21      PT LONG TERM GOAL #5   Title Patient will report no further instances of falls    Status Partially Met   11/25/20 - no recent falls reported   Target Date 02/03/21      PT LONG TERM GOAL #6   Title Improve posture and alignment with patient to demonstrate improved upright posture with posterior shoulder girdle engaged    Status New    Target Date 02/03/21      PT LONG  TERM GOAL #7   Title Decrease neck, shoulder and R knee pain by >/= 50% allowing patient increased ease of daily household tasks    Status New    Target Date 02/03/21      PT LONG TERM GOAL #8   Title Patient to improve cervical and B shoulder AROM to WFL/WNL without pain provocation    Status New    Target Date 02/03/21      PT LONG TERM GOAL  #9   TITLE Patient to report ability perform ADLs and household tasks without neck or knee pain interference    Status New    Target Date 02/03/21                    Plan - 12/09/20 1623     Clinical Impression Statement Diana [name] is active with PT per a referral from her PCP for deconditioning with frequent falls but now has an additional referral from Dr. Melrose Nakayama to address cervical spondylosis and R knee chondromalacia patella. Her neck pain is chronic originating following open heart surgery for CABG x 4 in 12/2017. She has been treated for the neck pain as part of prior PT episode in 2020/2021 for atypical chest wall pain as well as briefly earlier this year as part of episode related to a fall which exacerbated her neck and shoulder pain (this episode was cut short due per her  cardiologist due to plan for cardiac intervention with subsequent cardiac rehab). The knee pain is more acute but also resulting from a fall 1-2 months ago. Current deficits include neck and R peripatellar pain, postural abnormalities, limited and painful cervical and B shoulder AROM, cervical hypomobility and ttp with increased muscle tension and TPs t/o cervical paraspinals, L>R UT, LS, scalenes, periscapular muscles as well as B pecs, along with mod B shoulder weakness as well as ongoing B LE weakness which has been improving with PT thus far. Pain and increased muscle tension/LOM make cleaning/sweeping her home difficult and limit her ability to bend/squat or lift and carry things such as groceries. Diana [name] will benefit from skilled PT to address above deficits and current functional limitations as well as to decrease pain interference with daily activities. In addition to cervical and R knee issues, we will continue to address her ongoing balance and debility deficits contributing to her increased fall risk.    Comorbidities CAD s/p NSTEMI 10/30/16 & CABG x 4 01/10/18; HTN; cervicalgia; carpal tunnel syndrome s/p CTR 02/12/16; GERD; bronchitis    Rehab Potential Good    PT Frequency 2x / week    PT Duration 6 weeks    PT Treatment/Interventions ADLs/Self Care Home Management;Cryotherapy;Electrical Stimulation;Iontophoresis 79m/ml Dexamethasone;Moist Heat;Ultrasound;Gait training;Stair training;Functional mobility training;Therapeutic activities;Therapeutic exercise;Balance training;Neuromuscular re-education;Patient/family education;Manual techniques;Scar mobilization;Passive range of motion;Dry needling;Energy conservation;Taping;Vasopneumatic Device    PT Next Visit Plan create initial HEP for cervical pain/LOM; progress LE strengthening as tolerated; progress balance activities    PT Home Exercise Plan Access Code: [email or web address](6/7), [email or web address](6/23), [email or web address](7/12)    Consulted and Agree with Plan of Care  Patient             Patient will benefit from skilled therapeutic intervention in order to improve the following deficits and impairments:  Cardiopulmonary status limiting activity, Decreased activity tolerance, Decreased mobility, Decreased range of motion, Decreased scar mobility, Decreased strength, Hypomobility, Increased fascial restricitons, Increased muscle spasms, Impaired perceived functional ability, Impaired flexibility, Impaired UE functional use, Improper body mechanics, Postural dysfunction, Pain, Decreased  balance, Decreased endurance, Difficulty walking, Decreased safety awareness  Visit Diagnosis: Repeated falls - Plan: PT plan of care cert/re-cert  Unsteadiness on feet - Plan: PT plan of care cert/re-cert  Muscle weakness (generalized) - Plan: PT plan of care cert/re-cert  Other symptoms and signs involving the musculoskeletal system - Plan: PT plan of care cert/re-cert  Cervicalgia - Plan: PT plan of care cert/re-cert  Abnormal posture - Plan: PT plan of care cert/re-cert  Acute pain of right knee - Plan: PT plan of care cert/re-cert     Problem List Patient Active Problem List   Diagnosis Date Noted   Acute CVA (cerebrovascular accident) ([place]) 11/11/2020   Normocytic anemia 10/15/2019   Dysphagia 09/17/2019   Costochondritis 06/07/2019   Seasonal allergic rhinitis due to pollen 11/03/2018   Arthropathy of cervical facet joint 11/01/2018   Neural foraminal stenosis of cervical spine 11/01/2018   Atypical chest pain 10/24/2018   Adjustment disorder with mixed anxiety and depressed mood 09/05/2018   Insomnia 03/03/2018   S/P CABG x 4 01/10/2018   Hyperlipidemia 01/09/2018   Unstable angina ([place]) 01/05/2018   CAD (coronary artery disease), native coronary artery 12/28/2017   Asymptomatic microscopic hematuria 11/14/2017   Iron deficiency anemia 09/05/2017   Cervicalgia 12/18/2015   Benign essential HTN 04/08/2015   Metatarsalgia of right foot  02/11/2015    Percival Spanish, PT, MPT 12/09/2020, 8:24 PM  [facility] [address]  [place], [place], [address] Phone: [phone]   Fax:  [phone]  Name: Diana [name] MRN: [id number] Date of Birth: 22-May-1956

## 2020-12-11 ENCOUNTER — Ambulatory Visit: Payer: Medicare Other | Admitting: Physical Therapy

## 2020-12-11 ENCOUNTER — Other Ambulatory Visit: Payer: Self-pay

## 2020-12-11 DIAGNOSIS — M6281 Muscle weakness (generalized): Secondary | ICD-10-CM

## 2020-12-11 DIAGNOSIS — R296 Repeated falls: Secondary | ICD-10-CM

## 2020-12-11 DIAGNOSIS — R29898 Other symptoms and signs involving the musculoskeletal system: Secondary | ICD-10-CM

## 2020-12-11 DIAGNOSIS — M542 Cervicalgia: Secondary | ICD-10-CM

## 2020-12-11 DIAGNOSIS — M25561 Pain in right knee: Secondary | ICD-10-CM

## 2020-12-11 DIAGNOSIS — R293 Abnormal posture: Secondary | ICD-10-CM

## 2020-12-11 DIAGNOSIS — R2681 Unsteadiness on feet: Secondary | ICD-10-CM

## 2020-12-11 NOTE — Therapy (Signed)
[facility] [address]  [place], [place], [address] Phone: [phone]   Fax:  [phone]  Physical Therapy Treatment  Patient Details  Name: PROMISS LABARBERA MRN: [id number] Date of Birth: 1956-03-17 Referring Provider (PT): Melrose Nakayama, MD   Encounter Date: 12/11/2020   PT End of Session - 12/11/20 1524     Visit Number 10    Number of Visits 25    Date for PT Re-Evaluation 02/03/21    Authorization Type UHC Medicare & Medicaid [place]    Progress Note Due on Visit 19   Re-Eval/Recert completed on visit #9 - 12/09/20   PT Start Time 1524    PT Stop Time 1618    PT Time Calculation (min) 54 min    Activity Tolerance Patient tolerated treatment well    Behavior During Therapy [facility] for tasks assessed/performed             Past Medical History:  Diagnosis Date   Allergy    Arthritis    feet   Bronchitis 10/30/2016   Carpal tunnel syndrome    Coronary artery disease    GERD (gastroesophageal reflux disease)    Hyperlipidemia    Hypertension    Non-ST elevation (NSTEMI) myocardial infarction ([place]) 10/30/2016   NSTEMI (non-ST elevated myocardial infarction) ([place]) 10/30/2016    Past Surgical History:  Procedure Laterality Date   CARPAL TUNNEL RELEASE Left 02/12/2016   Procedure: LEFT CARPAL TUNNEL RELEASE;  Surgeon: Daryll Brod, MD;  Location: [place];  Service: Orthopedics;  Laterality: Left;  FAB   CORONARY ARTERY BYPASS GRAFT N/A 01/10/2018   Procedure: CORONARY ARTERY BYPASS GRAFTING (CABG) TIMES 4, USING LEFT INTERNAL MAMMARY ARTERY TO OM1  AND ENDOSCOPICALLY HARVESTED RIGHT SAPHENOUS VEIN TO DIAGONAL, AND SEQUENTIALLY TO OM2 AND DISTAL CIRCUMFLEX;  Surgeon: Grace Isaac, MD;  Location: [place];  Service: Open Heart Surgery;  Laterality: N/A;   LEFT HEART CATH AND CORONARY ANGIOGRAPHY N/A 01/05/2018   Procedure: LEFT HEART CATH AND CORONARY ANGIOGRAPHY;  Surgeon: Troy Sine, MD;   Location: [place] [facility];  Service: Cardiovascular;  Laterality: N/A;   TEE WITHOUT CARDIOVERSION N/A 01/10/2018   Procedure: TRANSESOPHAGEAL ECHOCARDIOGRAM (TEE);  Surgeon: Grace Isaac, MD;  Location: [place];  Service: Open Heart Surgery;  Laterality: N/A;   TONSILLECTOMY     TUBAL LIGATION     WRIST SURGERY Right     There were no vitals filed for this visit.   Subjective Assessment - 12/11/20 1528     Subjective Pt reports she just saw the gastoenterologist yesterday and he changed her meds. Will see the neurologist next week.    Pertinent History CABG x 4 - 01/10/18    Diagnostic tests 11/10/20 - cervical MRI: 1. No acute abnormality of the cervical spine.  2. Mild spinal canal stenosis at C3-C4 secondary to small disc bulge.  3. Moderate left C5-C6 neural foraminal stenosis.  4. Mild bilateral C4-C5 neural foraminal stenosis.    Patient Stated Goals "to reduce the muscle tightness and feel steadier on my feet"    Currently in Pain? Yes    Pain Score 6     Pain Location Neck    Pain Orientation Mid    Pain Descriptors / Indicators Pressure    Pain Type Chronic pain    Pain Score 4    Pain Location Shoulder    Pain Orientation Right    Pain Descriptors /  Indicators Pins and needles    Pain Type Chronic pain    Pain Score 2    Pain Location Knee    Pain Orientation Right    Pain Descriptors / Indicators Dull    Pain Type Acute pain                               [facility] Adult PT Treatment/Exercise - 12/11/20 1524       Neck Exercises: Seated   Neck Retraction 10 reps;5 secs      Knee/Hip Exercises: Aerobic   Nustep L5 x 6 min (UE/LE)      Shoulder Exercises: Prone   Extension Right;Left;10 reps;AROM;Strengthening    Extension Limitations cues for scap retraction while avoiding shoulder shrug      Shoulder Exercises: Standing   Extension Both;10 reps;Strengthening;Theraband    Theraband Level (Shoulder Extension) Level 1 (Yellow)     Extension Limitations cues for TrA activation & scap retraction    Row Both;10 reps;Strengthening;Theraband    Theraband Level (Shoulder Row) Level 1 (Yellow)    Row Limitations cues for TrA activation & scap retraction    Retraction Both;10 reps;Strengthening;Theraband    Theraband Level (Shoulder Retraction) Level 1 (Yellow)    Retraction Limitations TB draped over shoulders and crossed behind back - cues for scap retraction & depression pushing down and slightly outward on TB      Neck Exercises: Stretches   Upper Trapezius Stretch Right;Left;2 reps;30 seconds    Upper Trapezius Stretch Limitations cues to adjust degree of stretch to avoid increased pain    Levator Stretch Right;Left;2 reps;30 seconds    Corner Stretch 3 reps;30 seconds    Corner Stretch Limitations 3-way doorway pac stretch - cues to avoid lumbar hyperxtension and create stretch by stepping fwd   high position deferred from HEP                     PT Short Term Goals - 11/18/20 1536       PT SHORT TERM GOAL #1   Title Patient will be independent with initial HEP    Status Achieved   11/18/20              PT Long Term Goals - 12/09/20 1614       PT LONG TERM GOAL #1   Title Patient will be independent with ongoing/advanced HEP +/- gym program for self-management at home    Status On-going    Target Date 02/03/21      PT LONG TERM GOAL #2   Title Patient will demonstrate improved B LE strength to >/= 4/5 for improved stability and ease of mobility    Status Partially Met    Target Date 02/03/21      PT LONG TERM GOAL #3   Title Patient will decrease 5x STS time to </= 11.4 sec per age-appropriate norms    Baseline 23.09 sec    Status On-going   11/25/20 - reduced to 15.9 sec   Target Date 02/03/21      PT LONG TERM GOAL #4   Title Patient will improve FGA score to >/= 25/30 to improve gait stability and reduce risk for falls    Baseline 18/30    Status On-going   11/25/20 - increased to  23/30   Target Date 02/03/21      PT LONG TERM GOAL #5   Title Patient will report  no further instances of falls    Status Partially Met   11/25/20 - no recent falls reported   Target Date 02/03/21      PT LONG TERM GOAL #6   Title Improve posture and alignment with patient to demonstrate improved upright posture with posterior shoulder girdle engaged    Status New    Target Date 02/03/21      PT LONG TERM GOAL #7   Title Decrease neck, shoulder and R knee pain by >/= 50% allowing patient increased ease of daily household tasks    Status New    Target Date 02/03/21      PT LONG TERM GOAL #8   Title Patient to improve cervical and B shoulder AROM to WFL/WNL without pain provocation    Status New    Target Date 02/03/21      PT LONG TERM GOAL  #9   TITLE Patient to report ability perform ADLs and household tasks without neck or knee pain interference    Status New    Target Date 02/03/21                   Plan - 12/11/20 1534     Clinical Impression Statement Caidance reports changes in her meds from the gastroenterologist but has not yet seen the neurologist. Reviewed postural stretches and exercises from prior PT episodes, modifying resisted scap retraction as pt reports no place to anchor therabands at home. Pt requiring extensive verbal and tactile cues to ensure proper technique with exercises but reported good comfort level by end of session. She denied any increased pain or need for modalities.    Comorbidities CAD s/p NSTEMI 10/30/16 & CABG x 4 01/10/18; HTN; cervicalgia; carpal tunnel syndrome s/p CTR 02/12/16; GERD; bronchitis    Rehab Potential Good    PT Frequency 2x / week    PT Duration 6 weeks    PT Treatment/Interventions ADLs/Self Care Home Management;Cryotherapy;Electrical Stimulation;Iontophoresis 33m/ml Dexamethasone;Moist Heat;Ultrasound;Gait training;Stair training;Functional mobility training;Therapeutic activities;Therapeutic exercise;Balance  training;Neuromuscular re-education;Patient/family education;Manual techniques;Scar mobilization;Passive range of motion;Dry needling;Energy conservation;Taping;Vasopneumatic Device    PT Next Visit Plan create initial HEP for cervical pain/LOM; progress LE strengthening as tolerated; progress balance activities    PT Home Exercise Plan Access Code: [email or web address](6/7), [email or web address](6/23), [email or web address](7/12), [email or web address] (7/14)    Consulted and Agree with Plan of Care Patient             Patient will benefit from skilled therapeutic intervention in order to improve the following deficits and impairments:  Cardiopulmonary status limiting activity, Decreased activity tolerance, Decreased mobility, Decreased range of motion, Decreased scar mobility, Decreased strength, Hypomobility, Increased fascial restricitons, Increased muscle spasms, Impaired perceived functional ability, Impaired flexibility, Impaired UE functional use, Improper body mechanics, Postural dysfunction, Pain, Decreased balance, Decreased endurance, Difficulty walking, Decreased safety awareness  Visit Diagnosis: Repeated falls  Unsteadiness on feet  Muscle weakness (generalized)  Other symptoms and signs involving the musculoskeletal system  Cervicalgia  Abnormal posture  Acute pain of right knee     Problem List Patient Active Problem List   Diagnosis Date Noted   Acute CVA (cerebrovascular accident) ([place] 11/11/2020   Normocytic anemia 10/15/2019   Dysphagia 09/17/2019   Costochondritis 06/07/2019   Seasonal allergic rhinitis due to pollen 11/03/2018   Arthropathy of cervical facet joint 11/01/2018   Neural foraminal stenosis of cervical spine 11/01/2018   Atypical chest pain 10/24/2018   Adjustment disorder with mixed anxiety and depressed mood 09/05/2018   Insomnia  03/03/2018   S/P CABG x 4 01/10/2018   Hyperlipidemia 01/09/2018   Unstable angina ([place]) 01/05/2018   CAD (coronary artery disease), native coronary  artery 12/28/2017   Asymptomatic microscopic hematuria 11/14/2017   Iron deficiency anemia 09/05/2017   Cervicalgia 12/18/2015   Benign essential HTN 04/08/2015   Metatarsalgia of right foot 02/11/2015    Percival Spanish, PT, MPT 12/11/2020, 4:35 PM  [facility] [address]  [place], [place], [address] Phone: [phone]   Fax:  [phone]  Name: JODILYN GIESE MRN: [id number] Date of Birth: 22-Oct-1955

## 2020-12-11 NOTE — Patient Instructions (Signed)
    Access Code: [email or web address] URL: [email or web address] Date: 12/11/2020 Prepared by: Glenetta Hew  Exercises Doorway Pec Stretch at 60 Degrees Abduction with Arm Straight - 2-3 x daily - 7 x weekly - 3 reps - 30 sec hold Single Arm Doorway Pec Stretch at 60 Elevation - 2-3 x daily - 7 x weekly - 3 reps - 30 sec hold Seated Gentle Upper Trapezius Stretch - 2-3 x daily - 7 x weekly - 3 reps - 30 sec hold Gentle Levator Scapulae Stretch - 2-3 x daily - 7 x weekly - 3 reps - 30 sec hold Prone Shoulder Extension - Single Arm - 1 x daily - 7 x weekly - 2 sets - 10 reps - 3 sec hold

## 2020-12-16 ENCOUNTER — Ambulatory Visit: Payer: Medicare Other | Admitting: Physical Therapy

## 2020-12-16 ENCOUNTER — Other Ambulatory Visit: Payer: Self-pay

## 2020-12-16 ENCOUNTER — Encounter: Payer: Self-pay | Admitting: Physical Therapy

## 2020-12-16 VITALS — BP 122/74 | HR 73

## 2020-12-16 DIAGNOSIS — R296 Repeated falls: Secondary | ICD-10-CM

## 2020-12-16 DIAGNOSIS — M542 Cervicalgia: Secondary | ICD-10-CM

## 2020-12-16 DIAGNOSIS — M6281 Muscle weakness (generalized): Secondary | ICD-10-CM

## 2020-12-16 DIAGNOSIS — R2681 Unsteadiness on feet: Secondary | ICD-10-CM

## 2020-12-16 DIAGNOSIS — R293 Abnormal posture: Secondary | ICD-10-CM

## 2020-12-16 DIAGNOSIS — M25561 Pain in right knee: Secondary | ICD-10-CM

## 2020-12-16 DIAGNOSIS — R29898 Other symptoms and signs involving the musculoskeletal system: Secondary | ICD-10-CM

## 2020-12-16 NOTE — Therapy (Signed)
[facility] [address]  [place], [place], [address] Phone: [phone]   Fax:  [phone]  Physical Therapy Treatment  Patient Details  Name: Diana [name] MRN: [id number] Date of Birth: 04-30-1956 Referring Provider (PT): Melrose Nakayama, MD   Encounter Date: 12/16/2020   PT End of Session - 12/16/20 1531     Visit Number 11    Number of Visits 25    Date for PT Re-Evaluation 02/03/21    Authorization Type UHC Medicare & Medicaid [place]    Progress Note Due on Visit 19   Re-Eval/Recert completed on visit #9 - 12/09/20   PT Start Time 1531    PT Stop Time 1626    PT Time Calculation (min) 55 min    Activity Tolerance Patient tolerated treatment well    Behavior During Therapy [facility] for tasks assessed/performed             Past Medical History:  Diagnosis Date   Allergy    Arthritis    feet   Bronchitis 10/30/2016   Carpal tunnel syndrome    Coronary artery disease    GERD (gastroesophageal reflux disease)    Hyperlipidemia    Hypertension    Non-ST elevation (NSTEMI) myocardial infarction ([place]) 10/30/2016   NSTEMI (non-ST elevated myocardial infarction) ([place]) 10/30/2016    Past Surgical History:  Procedure Laterality Date   CARPAL TUNNEL RELEASE Left 02/12/2016   Procedure: LEFT CARPAL TUNNEL RELEASE;  Surgeon: Daryll Brod, MD;  Location: [place];  Service: Orthopedics;  Laterality: Left;  FAB   CORONARY ARTERY BYPASS GRAFT N/A 01/10/2018   Procedure: CORONARY ARTERY BYPASS GRAFTING (CABG) TIMES 4, USING LEFT INTERNAL MAMMARY ARTERY TO OM1  AND ENDOSCOPICALLY HARVESTED RIGHT SAPHENOUS VEIN TO DIAGONAL, AND SEQUENTIALLY TO OM2 AND DISTAL CIRCUMFLEX;  Surgeon: Grace Isaac, MD;  Location: [place];  Service: Open Heart Surgery;  Laterality: N/A;   LEFT HEART CATH AND CORONARY ANGIOGRAPHY N/A 01/05/2018   Procedure: LEFT HEART CATH AND CORONARY ANGIOGRAPHY;  Surgeon: Troy Sine, MD;   Location: [place] CV [facility];  Service: Cardiovascular;  Laterality: N/A;   TEE WITHOUT CARDIOVERSION N/A 01/10/2018   Procedure: TRANSESOPHAGEAL ECHOCARDIOGRAM (TEE);  Surgeon: Grace Isaac, MD;  Location: [place];  Service: Open Heart Surgery;  Laterality: N/A;   TONSILLECTOMY     TUBAL LIGATION     WRIST SURGERY Right     Vitals:   12/16/20 1537  BP: 122/74  Pulse: 73  SpO2: 96%     Subjective Assessment - 12/16/20 1537     Subjective Pt reports seh hurting all along her L side (head down to feet) today which is causing het ot shake.    Pertinent History CABG x 4 - 01/10/18    Diagnostic tests 11/10/20 - cervical MRI: 1. No acute abnormality of the cervical spine.  2. Mild spinal canal stenosis at C3-C4 secondary to small disc bulge.  3. Moderate left C5-C6 neural foraminal stenosis.  4. Mild bilateral C4-C5 neural foraminal stenosis.    Patient Stated Goals "to reduce the muscle tightness and feel steadier on my feet"    Currently in Pain? Yes    Pain Score 7     Pain Location Generalized   entire L side from head to foot   Pain Orientation Left    Pain Descriptors / Indicators Pressure;Aching    Pain Type Acute pain;Chronic pain    Pain Frequency Constant  [place] Adult PT Treatment/Exercise - 12/16/20 1531       Knee/Hip Exercises: Aerobic   Recumbent Bike L1 x 1 min - discontinued d/t "shakiness"      Modalities   Modalities Electrical Stimulation;Moist Heat      Moist Heat Therapy   Number Minutes Moist Heat 15 Minutes    Moist Heat Location Cervical;Hip      Electrical Stimulation   Electrical Stimulation Location B UT/LS    Electrical Stimulation Action IFC    Electrical Stimulation Parameters 80-150 Hz, intensity to pt tol x 15'    Electrical Stimulation Goals Pain;Tone      Manual Therapy   Manual Therapy Soft tissue mobilization;Myofascial release;Passive ROM;Manual Traction    Soft tissue mobilization  STM to B cervical paraspinals, scalenes, UT/LS, periscapular muscles and pecs  (L>R), L temporalis    Myofascial Release manual TPR to L>R UT & LS; gentle suboccipital release    Passive ROM gentle B manual UT & LS stretches    Manual Traction gentle cervical distaction with prolonged holds                      PT Short Term Goals - 11/18/20 1536       PT SHORT TERM GOAL #1   Title Patient will be independent with initial HEP    Status Achieved   11/18/20              PT Long Term Goals - 12/16/20 1610       PT LONG TERM GOAL #1   Title Patient will be independent with ongoing/advanced HEP +/- gym program for self-management at home    Status On-going    Target Date 02/03/21      PT LONG TERM GOAL #2   Title Patient will demonstrate improved B LE strength to >/= 4/5 for improved stability and ease of mobility    Status Partially Met    Target Date 02/03/21      PT LONG TERM GOAL #3   Title Patient will decrease 5x STS time to </= 11.4 sec per age-appropriate norms    Baseline 23.09 sec    Status On-going   11/25/20 - reduced to 15.9 sec   Target Date 02/03/21      PT LONG TERM GOAL #4   Title Patient will improve FGA score to >/= 25/30 to improve gait stability and reduce risk for falls    Baseline 18/30    Status On-going   11/25/20 - increased to 23/30   Target Date 02/03/21      PT LONG TERM GOAL #5   Title Patient will report no further instances of falls    Status Partially Met   11/25/20 - no recent falls reported   Target Date 02/03/21      PT LONG TERM GOAL #6   Title Improve posture and alignment with patient to demonstrate improved upright posture with posterior shoulder girdle engaged    Status On-going    Target Date 02/03/21      PT LONG TERM GOAL #7   Title Decrease neck, shoulder and R knee pain by >/= 50% allowing patient increased ease of daily household tasks    Status On-going    Target Date 02/03/21      PT LONG TERM GOAL #8    Title Patient to improve cervical and B shoulder AROM to WFL/WNL without pain provocation    Status On-going    Target  Date 02/03/21      PT LONG TERM GOAL  #9   TITLE Patient to report ability perform ADLs and household tasks without neck or knee pain interference    Status On-going    Target Date 02/03/21                   Plan - 12/16/20 1611     Clinical Impression Statement Diana [name] arrived to PT complaining of generalized L sided pain from head to toe today - originated in neck and shoulder yesterday and progressed to entire L side after attempting to do some housework earlier today. Pain causing her to shake and pt concerned about if she was having a seizure or another ministroke. Stroke screen negative, VS WNL and symptoms not really consistent with either CVA or seizures - pt opting not to go to ER to be checked out but will inform neurologist of her symptoms when she sees MD next week. Attempted to complete warm-up on recumbent bike but pt unable due to shakiness, therefore shifted therapy focus to pain management with MT and modalities addressing neck and shoulder pain primarily as this is what pt believes expanded to the current pain she is experiencing. Pt reports pain down to 5/10 following MT and inquiring if estim would further help her pain, therefore session concluded with estim to neck shoulder area and moist heat packs to neck and hips to promote further pain reduction.    Comorbidities CAD s/p NSTEMI 10/30/16 & CABG x 4 01/10/18; HTN; cervicalgia; carpal tunnel syndrome s/p CTR 02/12/16; GERD; bronchitis    Rehab Potential Good    PT Frequency 2x / week    PT Duration 6 weeks    PT Treatment/Interventions ADLs/Self Care Home Management;Cryotherapy;Electrical Stimulation;Iontophoresis 44m/ml Dexamethasone;Moist Heat;Ultrasound;Gait training;Stair training;Functional mobility training;Therapeutic activities;Therapeutic exercise;Balance training;Neuromuscular  re-education;Patient/family education;Manual techniques;Scar mobilization;Passive range of motion;Dry needling;Energy conservation;Taping;Vasopneumatic Device    PT Next Visit Plan review initial HEP for cervical pain/LOM; progress LE strengthening as tolerated; progress balance activities    PT Home Exercise Plan Access Code: [email or web address](6/7), [email or web address](6/23), [email or web address](7/12), [email or web address] (7/14)    Consulted and Agree with Plan of Care Patient             Patient will benefit from skilled therapeutic intervention in order to improve the following deficits and impairments:  Cardiopulmonary status limiting activity, Decreased activity tolerance, Decreased mobility, Decreased range of motion, Decreased scar mobility, Decreased strength, Hypomobility, Increased fascial restricitons, Increased muscle spasms, Impaired perceived functional ability, Impaired flexibility, Impaired UE functional use, Improper body mechanics, Postural dysfunction, Pain, Decreased balance, Decreased endurance, Difficulty walking, Decreased safety awareness  Visit Diagnosis: Repeated falls  Unsteadiness on feet  Muscle weakness (generalized)  Other symptoms and signs involving the musculoskeletal system  Cervicalgia  Abnormal posture  Acute pain of right knee     Problem List Patient Active Problem List   Diagnosis Date Noted   Acute CV[place] ([place]) [place] 11/11/[place]   Normocytic anemia 10/15/2019   Dysphagia 09/17/2019   Costochondritis 06/07/2019   Seasonal allergic rhinitis due to pollen 11/03/2018   Arthropathy of cervical facet joint 11/01/2018   Neural foraminal stenosis of cervical spine 11/01/2018   Atypical chest pain 10/24/2018   Adjustment disorder with mixed anxiety and depressed mood 09/05/2018   Insomnia 03/03/2018   S/P CABG x 4 01/10/2018   Hyperlipidemia 01/09/2018   Unstable angina (HCC) 01/05/2018   CAD (coronary artery disease), native coronary artery 12/28/2017    Asymptomatic microscopic hematuria  11/14/2017   Iron deficiency anemia 09/05/2017   Cervicalgia 12/18/2015   Benign essential HTN 04/08/2015   Metatarsalgia of right foot 02/11/2015    Percival Spanish, PT, MPT 12/16/2020, 6:29 PM  [facility] [address]  [place], [place], [address] Phone: [phone]   Fax:  [phone]  Name: Diana [name] MRN: [id number] Date of Birth: August 13, 1955

## 2020-12-23 ENCOUNTER — Other Ambulatory Visit: Payer: Self-pay

## 2020-12-23 ENCOUNTER — Ambulatory Visit: Payer: Medicare Other | Admitting: Physical Therapy

## 2020-12-23 ENCOUNTER — Encounter: Payer: Self-pay | Admitting: Physical Therapy

## 2020-12-23 DIAGNOSIS — R293 Abnormal posture: Secondary | ICD-10-CM

## 2020-12-23 DIAGNOSIS — R296 Repeated falls: Secondary | ICD-10-CM | POA: Diagnosis not present

## 2020-12-23 DIAGNOSIS — R29898 Other symptoms and signs involving the musculoskeletal system: Secondary | ICD-10-CM

## 2020-12-23 DIAGNOSIS — M25561 Pain in right knee: Secondary | ICD-10-CM

## 2020-12-23 DIAGNOSIS — M6281 Muscle weakness (generalized): Secondary | ICD-10-CM

## 2020-12-23 DIAGNOSIS — M542 Cervicalgia: Secondary | ICD-10-CM

## 2020-12-23 DIAGNOSIS — R2681 Unsteadiness on feet: Secondary | ICD-10-CM

## 2020-12-23 NOTE — Patient Instructions (Signed)
     Access Code: [email or web address] URL: [email or web address] Date: 12/23/2020 Prepared by: Glenetta Hew  Exercises Doorway Pec Stretch at 60 Degrees Abduction with Arm Straight - 2-3 x daily - 7 x weekly - 3 reps - 30 sec hold Single Arm Doorway Pec Stretch at 60 Elevation - 2-3 x daily - 7 x weekly - 3 reps - 30 sec hold Seated Gentle Upper Trapezius Stretch - 2-3 x daily - 7 x weekly - 3 reps - 30 sec hold Gentle Levator Scapulae Stretch - 2-3 x daily - 7 x weekly - 3 reps - 30 sec hold Seated Scalene Stretch with Towel - 2 x daily - 7 x weekly - 3 reps - 30 sec hold Mid-Lower Cervical Extension SNAG with Strap - 1 x daily - 7 x weekly - 2 sets - 10 reps - 3 sec hold Seated Assisted Cervical Rotation with Towel - 1 x daily - 7 x weekly - 2 sets - 10 reps - 3 sec hold Seated Shoulder Row with Anchored Resistance - 1 x daily - 7 x weekly - 2 sets - 10 reps - 3 hold hold Seated Shoulder Extension and Scapular Retraction with Resistance - 1 x daily - 7 x weekly - 2 sets - 10 reps - 3 sec hold

## 2020-12-23 NOTE — Therapy (Signed)
[facility] [address]  [place], [place], [address] Phone: [phone]   Fax:  [phone]  Physical Therapy Treatment  Patient Details  Name: Diana [name] MRN: [id number] Date of Birth: [birth year] Referring Provider (PT): Melrose Nakayama, MD   Encounter Date: 12/23/2020   PT End of Session - 12/23/20 1447     Visit Number 12    Number of Visits 25    Date for PT Re-Evaluation 02/03/21    Authorization Type UHC Medicare & Medicaid [place]    Progress Note Due on Visit 19   Re-Eval/Recert completed on visit #9 - 12/09/20   PT Start Time 1447    PT Stop Time 1532    PT Time Calculation (min) 45 min    Activity Tolerance Patient tolerated treatment well    Behavior During Therapy [facility] for tasks assessed/performed             Past Medical History:  Diagnosis Date   Allergy    Arthritis    feet   Bronchitis 10/30/2016   Carpal tunnel syndrome    Coronary artery disease    GERD (gastroesophageal reflux disease)    Hyperlipidemia    Hypertension    Non-ST elevation (NSTEMI) myocardial infarction ([place]) 10/30/2016   NSTEMI (non-ST elevated myocardial infarction) ([place]) 10/30/2016    Past Surgical History:  Procedure Laterality Date   CARPAL TUNNEL RELEASE Left 02/12/2016   Procedure: LEFT CARPAL TUNNEL RELEASE;  Surgeon: Daryll Brod, MD;  Location: [place];  Service: Orthopedics;  Laterality: Left;  FAB   CORONARY ARTERY BYPASS GRAFT N/A 01/10/2018   Procedure: CORONARY ARTERY BYPASS GRAFTING (CABG) TIMES 4, USING LEFT INTERNAL MAMMARY ARTERY TO OM1  AND ENDOSCOPICALLY HARVESTED RIGHT SAPHENOUS VEIN TO DIAGONAL, AND SEQUENTIALLY TO OM2 AND DISTAL CIRCUMFLEX;  Surgeon: Grace Isaac, MD;  Location: [place];  Service: Open Heart Surgery;  Laterality: N/A;   LEFT HEART CATH AND CORONARY ANGIOGRAPHY N/A 01/05/2018   Procedure: LEFT HEART CATH AND CORONARY ANGIOGRAPHY;  Surgeon: Troy Sine, MD;   Location: [place] CV [facility];  Service: Cardiovascular;  Laterality: N/A;   TEE WITHOUT CARDIOVERSION N/A 01/10/2018   Procedure: TRANSESOPHAGEAL ECHOCARDIOGRAM (TEE);  Surgeon: Grace Isaac, MD;  Location: [place];  Service: Open Heart Surgery;  Laterality: N/A;   TONSILLECTOMY     TUBAL LIGATION     WRIST SURGERY Right     There were no vitals filed for this visit.   Subjective Assessment - 12/23/20 1449     Subjective Pt reports she feels better today - only mild pain at 4/10.    Pertinent History CABG x 4 - 01/10/18    Diagnostic tests 11/10/20 - cervical MRI: 1. No acute abnormality of the cervical spine.  2. Mild spinal canal stenosis at C3-C4 secondary to small disc bulge.  3. Moderate left C5-C6 neural foraminal stenosis.  4. Mild bilateral C4-C5 neural foraminal stenosis.    Patient Stated Goals "to reduce the muscle tightness and feel steadier on my feet"    Currently in Pain? Yes    Pain Location Neck   & shoulders   Pain Orientation Right;Left    Pain Descriptors / Indicators Pressure    Pain Type Chronic pain    Pain Frequency Constant  [place] Adult PT Treatment/Exercise - 12/23/20 1447       Neck Exercises: Machines for Strengthening   UBE (Upper Arm Bike) L1.0 x 6 min (3' fwd/3' back)      Neck Exercises: Seated   Neck Retraction 10 reps;5 secs    Cervical Rotation Right;Left;5 reps   2 sets   Cervical Rotation Limitations rotation SNAGs with pillowcase    Other Seated Exercise Cervical extension SNAGs with pillowcase10 x 3"      Shoulder Exercises: Supine   Horizontal ABduction Both;10 reps;Strengthening;Theraband    Theraband Level (Shoulder Horizontal ABduction) Level 1 (Yellow)    Horizontal ABduction Limitations cues for TrA activation & scap retraction      Shoulder Exercises: Seated   Extension Both;10 reps;Strengthening;Theraband    Theraband Level (Shoulder Extension) Level 1 (Yellow)    Extension  Limitations cues for abd bracing & scap retraction/depression avoiding shoulder elevation/shrug    Row Both;10 reps;Strengthening;Theraband    Theraband Level (Shoulder Row) Level 1 (Yellow)    Row Limitations cues for abd bracing & scap retraction/depression avoiding shoulder elevation/shrug      Neck Exercises: Stretches   Upper Trapezius Stretch Right;Left;2 reps;30 seconds    Levator Stretch Right;Left;2 reps;30 seconds    Other Neck Stretches R/L scalene stretch with towel over shoulder                    PT Education - 12/23/20 1531     Education Details Review & update of cervical & shoulder HEP - Access Code: [email or web address]    Person(s) Educated Patient    Methods Explanation;Demonstration;Verbal cues;Tactile cues;Handout    Comprehension Verbalized understanding;Verbal cues required;Tactile cues required;Returned demonstration;Need further instruction              PT Short Term Goals - 11/18/20 1536       PT SHORT TERM GOAL #1   Title Patient will be independent with initial HEP    Status Achieved   11/18/20              PT Long Term Goals - 12/16/20 1610       PT LONG TERM GOAL #1   Title Patient will be independent with ongoing/advanced HEP +/- gym program for self-management at home    Status On-going    Target Date 02/03/21      PT LONG TERM GOAL #2   Title Patient will demonstrate improved B LE strength to >/= 4/5 for improved stability and ease of mobility    Status Partially Met    Target Date 02/03/21      PT LONG TERM GOAL #3   Title Patient will decrease 5x STS time to </= 11.4 sec per age-appropriate norms    Baseline 23.09 sec    Status On-going   11/25/20 - reduced to 15.9 sec   Target Date 02/03/21      PT LONG TERM GOAL #4   Title Patient will improve FGA score to >/= 25/30 to improve gait stability and reduce risk for falls    Baseline 18/30    Status On-going   11/25/20 - increased to 23/30   Target Date 02/03/21      PT LONG  TERM GOAL #5   Title Patient will report no further instances of falls    Status Partially Met   11/25/20 - no recent falls reported   Target Date 02/03/21      PT LONG TERM GOAL #6   Title Improve posture  and alignment with patient to demonstrate improved upright posture with posterior shoulder girdle engaged    Status On-going    Target Date 02/03/21      PT LONG TERM GOAL #7   Title Decrease neck, shoulder and R knee pain by >/= 50% allowing patient increased ease of daily household tasks    Status On-going    Target Date 02/03/21      PT LONG TERM GOAL #8   Title Patient to improve cervical and B shoulder AROM to WFL/WNL without pain provocation    Status On-going    Target Date 02/03/21      PT LONG TERM GOAL  #9   TITLE Patient to report ability perform ADLs and household tasks without neck or knee pain interference    Status On-going    Target Date 02/03/21                   Plan - 12/23/20 1452     Clinical Impression Statement Diana [name] reports her pain is much better than last visit with pain today predominantly in B neck and shoulders. Able to complete review and instruction in cervical/shoulder HEP, with some modifications/alternatives provided for stretches as well as introduction of cervical AAROM and postural strengthening. Diana [name] continues to demonstrate limited endurance/exercise tolerance with visible shaking evident with fatigue - cautioned pt to avoid pushing to point of fatigue with home performance, adjusting reps and allowing for rest breaks as she builds up her exercise tolerance.    Comorbidities CAD s/p NSTEMI 10/30/16 & CABG x 4 01/10/18; HTN; cervicalgia; carpal tunnel syndrome s/p CTR 02/12/16; GERD; bronchitis    Rehab Potential Good    PT Frequency 2x / week    PT Duration 6 weeks    PT Treatment/Interventions ADLs/Self Care Home Management;Cryotherapy;Electrical Stimulation;Iontophoresis 77m/ml Dexamethasone;Moist Heat;Ultrasound;Gait training;Stair  training;Functional mobility training;Therapeutic activities;Therapeutic exercise;Balance training;Neuromuscular re-education;Patient/family education;Manual techniques;Scar mobilization;Passive range of motion;Dry needling;Energy conservation;Taping;Vasopneumatic Device    PT Next Visit Plan review initial HEP for cervical pain/LOM; progress LE strengthening as tolerated; progress balance activities    PT Home Exercise Plan Access Code: [email or web address](6/7), [email or web address](6/23), [email or web address](7/12), [email or web address] (7/14, updated 7/26)    Consulted and Agree with Plan of Care Patient             Patient will benefit from skilled therapeutic intervention in order to improve the following deficits and impairments:  Cardiopulmonary status limiting activity, Decreased activity tolerance, Decreased mobility, Decreased range of motion, Decreased scar mobility, Decreased strength, Hypomobility, Increased fascial restricitons, Increased muscle spasms, Impaired perceived functional ability, Impaired flexibility, Impaired UE functional use, Improper body mechanics, Postural dysfunction, Pain, Decreased balance, Decreased endurance, Difficulty walking, Decreased safety awareness  Visit Diagnosis: Repeated falls  Unsteadiness on feet  Muscle weakness (generalized)  Other symptoms and signs involving the musculoskeletal system  Cervicalgia  Abnormal posture  Acute pain of right knee     Problem List Patient Active Problem List   Diagnosis Date Noted   Ac[place] 11/11/[place]   Normocytic anemia 10/15/2019   Dysphagia 09/17/2019   Costochondritis 06/07/2019   Seasonal allergic rhinitis due to pollen 11/03/2018   Arthropathy of cervical facet joint 11/01/2018   Neural foraminal stenosis of cervical spine 11/01/2018   Atypical chest pain 10/24/2018   Adjustment disorder with mixed anxiety and depressed mood 09/05/2018   Insomnia 03/03/2018   S/P CABG x 4 01/10/2018    Hyperlipidemia 01/09/2018   Unstable angina (HCC) 01/05/2018   CAD (coronary  artery disease), native coronary artery 12/28/2017   Asymptomatic microscopic hematuria 11/14/2017   Iron deficiency anemia 09/05/2017   Cervicalgia 12/18/2015   Benign essential HTN 04/08/2015   Metatarsalgia of right foot 02/11/2015    Percival Spanish, PT, MPT 12/23/2020, 7:57 PM  [facility] [address]  [place], [place], [address] Phone: [phone]   Fax:  [phone]  Name: Diana [name] MRN: [id number] Date of Birth: 1956-03-04

## 2020-12-26 ENCOUNTER — Encounter: Payer: Medicare Other | Admitting: Physical Therapy

## 2020-12-30 ENCOUNTER — Other Ambulatory Visit: Payer: Self-pay

## 2020-12-30 ENCOUNTER — Encounter: Payer: Self-pay | Admitting: Physical Therapy

## 2020-12-30 ENCOUNTER — Ambulatory Visit: Payer: Medicare Other | Attending: Physician Assistant | Admitting: Physical Therapy

## 2020-12-30 DIAGNOSIS — R29898 Other symptoms and signs involving the musculoskeletal system: Secondary | ICD-10-CM | POA: Diagnosis present

## 2020-12-30 DIAGNOSIS — G8929 Other chronic pain: Secondary | ICD-10-CM | POA: Diagnosis present

## 2020-12-30 DIAGNOSIS — M542 Cervicalgia: Secondary | ICD-10-CM | POA: Insufficient documentation

## 2020-12-30 DIAGNOSIS — M25561 Pain in right knee: Secondary | ICD-10-CM | POA: Diagnosis present

## 2020-12-30 DIAGNOSIS — R2681 Unsteadiness on feet: Secondary | ICD-10-CM | POA: Insufficient documentation

## 2020-12-30 DIAGNOSIS — M6281 Muscle weakness (generalized): Secondary | ICD-10-CM | POA: Insufficient documentation

## 2020-12-30 DIAGNOSIS — M5441 Lumbago with sciatica, right side: Secondary | ICD-10-CM | POA: Diagnosis present

## 2020-12-30 DIAGNOSIS — R296 Repeated falls: Secondary | ICD-10-CM | POA: Diagnosis not present

## 2020-12-30 DIAGNOSIS — R293 Abnormal posture: Secondary | ICD-10-CM | POA: Diagnosis present

## 2020-12-30 DIAGNOSIS — M5442 Lumbago with sciatica, left side: Secondary | ICD-10-CM | POA: Diagnosis present

## 2020-12-30 NOTE — Therapy (Signed)
[facility] [address]  [place], [place], [address] Phone: [phone]   Fax:  [phone]  Physical Therapy Treatment  Patient Details  Name: Diana [name] MRN: [id number] Date of Birth: [birth year] Referring Provider (PT): Melrose Nakayama, MD   Encounter Date: 12/30/2020   PT End of Session - 12/30/20 1535     Visit Number 13    Number of Visits 25    Date for PT Re-Evaluation 02/03/21    Authorization Type UHC Medicare & Medicaid [place]    Progress Note Due on Visit 19   Re-Eval/Recert completed on visit #9 - 12/09/20   PT Start Time 8250    PT Stop Time 5397    PT Time Calculation (min) 58 min    Activity Tolerance Patient tolerated treatment well    Behavior During Therapy [facility] for tasks assessed/performed             Past Medical History:  Diagnosis Date   Allergy    Arthritis    feet   Bronchitis 10/30/2016   Carpal tunnel syndrome    Coronary artery disease    GERD (gastroesophageal reflux disease)    Hyperlipidemia    Hypertension    Non-ST elevation (NSTEMI) myocardial infarction ([place]) 10/30/2016   NSTEMI (non-ST elevated myocardial infarction) ([place]) 10/30/2016    Past Surgical History:  Procedure Laterality Date   CARPAL TUNNEL RELEASE Left 02/12/2016   Procedure: LEFT CARPAL TUNNEL RELEASE;  Surgeon: Daryll Brod, MD;  Location: [place];  Service: Orthopedics;  Laterality: Left;  FAB   CORONARY ARTERY BYPASS GRAFT N/A 01/10/2018   Procedure: CORONARY ARTERY BYPASS GRAFTING (CABG) TIMES 4, USING LEFT INTERNAL MAMMARY ARTERY TO OM1  AND ENDOSCOPICALLY HARVESTED RIGHT SAPHENOUS VEIN TO DIAGONAL, AND SEQUENTIALLY TO OM2 AND DISTAL CIRCUMFLEX;  Surgeon: Grace Isaac, MD;  Location: [place];  Service: Open Heart Surgery;  Laterality: N/A;   LEFT HEART CATH AND CORONARY ANGIOGRAPHY N/A 01/05/2018   Procedure: LEFT HEART CATH AND CORONARY ANGIOGRAPHY;  Surgeon: Troy Sine, MD;  Location:  [place] [facility];  Service: Cardiovascular;  Laterality: N/A;   TEE WITHOUT CARDIOVERSION N/A 01/10/2018   Procedure: TRANSESOPHAGEAL ECHOCARDIOGRAM (TEE);  Surgeon: Grace Isaac, MD;  Location: [place];  Service: Open Heart Surgery;  Laterality: N/A;   TONSILLECTOMY     TUBAL LIGATION     WRIST SURGERY Right     There were no vitals filed for this visit.   Subjective Assessment - 12/30/20 1539     Subjective Pt reports she missed her last visit due to feeling poorly after her COVID booster - "it took the whole weekend for my arm to start feeling right" and still feels soreness in her arm. Also not feeling quite herself as they have stopped some of her meds in prep for her upcoming endoscopy for esphogeal dilation.    Pertinent History CABG x 4 - 01/10/18    Diagnostic tests 11/10/20 - cervical MRI: 1. No acute abnormality of the cervical spine.  2. Mild spinal canal stenosis at C3-C4 secondary to small disc bulge.  3. Moderate left C5-C6 neural foraminal stenosis.  4. Mild bilateral C4-C5 neural foraminal stenosis.    Patient Stated Goals "to reduce the muscle tightness and feel steadier on my feet"    Currently in Pain? Yes    Pain Score 4     Pain Location Neck   & shoulders   Pain  Orientation Right;Left    Pain Descriptors / Indicators Other (Comment)   "stiffness'   Pain Type Chronic pain    Pain Frequency Constant                               [facility] Adult PT Treatment/Exercise - 12/30/20 1535       Neck Exercises: Machines for Strengthening   UBE (Upper Arm Bike) L2.0 x 6 min (3' fwd/3' back)      Neck Exercises: Stretches   Chest Stretch 3 reps;30 seconds    Chest Stretch Limitations "snow angel" stretch hooklying on towel roll along spine      Shoulder Exercises: Supine   Horizontal ABduction Both;10 reps;Strengthening;Theraband    Theraband Level (Shoulder Horizontal ABduction) Level 1 (Yellow)    Horizontal ABduction Limitations hooklying on  towel roll - cues for TrA activation & scap retraction into towel roll    External Rotation Both;10 reps;Strengthening;Theraband    Theraband Level (Shoulder External Rotation) Level 1 (Yellow)    External Rotation Limitations hooklying on towel roll - cues for TrA activation & scap retraction into towel roll    Diagonals Both;10 reps;Strengthening;Theraband    Theraband Level (Shoulder Diagonals) Level 1 (Yellow)    Diagonals Limitations hooklying on towel roll - cues for TrA activation & scap retraction into towel roll      Modalities   Modalities Moist Heat      Moist Heat Therapy   Number Minutes Moist Heat 15 Minutes    Moist Heat Location Cervical;Shoulder   Thoracic spine     Manual Therapy   Manual Therapy Soft tissue mobilization;Myofascial release;Passive ROM;Manual Traction    Soft tissue mobilization STM to B cervical paraspinals, scalenes, UT/LS and pecs  (L>R)    Myofascial Release manual TPR to L>R UT & LS; gentle suboccipital release    Passive ROM gentle B manual UT & LS stretches    Manual Traction gentle cervical distaction with prolonged holds                      PT Short Term Goals - 11/18/20 1536       PT SHORT TERM GOAL #1   Title Patient will be independent with initial HEP    Status Achieved   11/18/20              PT Long Term Goals - 12/16/20 1610       PT LONG TERM GOAL #1   Title Patient will be independent with ongoing/advanced HEP +/- gym program for self-management at home    Status On-going    Target Date 02/03/21      PT LONG TERM GOAL #2   Title Patient will demonstrate improved B LE strength to >/= 4/5 for improved stability and ease of mobility    Status Partially Met    Target Date 02/03/21      PT LONG TERM GOAL #3   Title Patient will decrease 5x STS time to </= 11.4 sec per age-appropriate norms    Baseline 23.09 sec    Status On-going   11/25/20 - reduced to 15.9 sec   Target Date 02/03/21      PT LONG TERM  GOAL #4   Title Patient will improve FGA score to >/= 25/30 to improve gait stability and reduce risk for falls    Baseline 18/30    Status On-going   11/25/20 -  increased to 23/30   Target Date 02/03/21      PT LONG TERM GOAL #5   Title Patient will report no further instances of falls    Status Partially Met   11/25/20 - no recent falls reported   Target Date 02/03/21      PT LONG TERM GOAL #6   Title Improve posture and alignment with patient to demonstrate improved upright posture with posterior shoulder girdle engaged    Status On-going    Target Date 02/03/21      PT LONG TERM GOAL #7   Title Decrease neck, shoulder and R knee pain by >/= 50% allowing patient increased ease of daily household tasks    Status On-going    Target Date 02/03/21      PT LONG TERM GOAL #8   Title Patient to improve cervical and B shoulder AROM to WFL/WNL without pain provocation    Status On-going    Target Date 02/03/21      PT LONG TERM GOAL  #9   TITLE Patient to report ability perform ADLs and household tasks without neck or knee pain interference    Status On-going    Target Date 02/03/21                   Plan - 12/30/20 1618     Clinical Impression Statement Pam reports she had a bad reaction to her latest COVID vaccine booster from which it took her several days to recover, causing her to miss her last PT appointment. She reports limited performance of the latest HEP update as a result but denies need for review and states the pillowcase assisted SNAGs seem to be helping her neck. Pt wishing to proceed with strengthening exercises for her neck and shoulders, therefore progressed yellow TB scapular strengthening with TrA activation for core stability in hooklying on towel roll - some fatigue noted requiring rest breaks between exercises but otherwise well tolerated. Session concluded with manual therapy to promote muscle relaxation in neck and upper shoulders followed by moist heat at  pt request.    Comorbidities CAD s/p NSTEMI 10/30/16 & CABG x 4 01/10/18; HTN; cervicalgia; carpal tunnel syndrome s/p CTR 02/12/16; GERD; bronchitis    Rehab Potential Good    PT Frequency 2x / week    PT Duration 6 weeks    PT Treatment/Interventions ADLs/Self Care Home Management;Cryotherapy;Electrical Stimulation;Iontophoresis 78m/ml Dexamethasone;Moist Heat;Ultrasound;Gait training;Stair training;Functional mobility training;Therapeutic activities;Therapeutic exercise;Balance training;Neuromuscular re-education;Patient/family education;Manual techniques;Scar mobilization;Passive range of motion;Dry needling;Energy conservation;Taping;Vasopneumatic Device    PT Next Visit Plan review initial HEP for cervical pain/LOM; progress LE strengthening as tolerated; progress balance activities    PT Home Exercise Plan Access Code: [email or web address](6/7), [email or web address](6/23), [email or web address](7/12), [email or web address] (7/14, updated 7/26)    Consulted and Agree with Plan of Care Patient             Patient will benefit from skilled therapeutic intervention in order to improve the following deficits and impairments:  Cardiopulmonary status limiting activity, Decreased activity tolerance, Decreased mobility, Decreased range of motion, Decreased scar mobility, Decreased strength, Hypomobility, Increased fascial restricitons, Increased muscle spasms, Impaired perceived functional ability, Impaired flexibility, Impaired UE functional use, Improper body mechanics, Postural dysfunction, Pain, Decreased balance, Decreased endurance, Difficulty walking, Decreased safety awareness  Visit Diagnosis: Repeated falls  Unsteadiness on feet  Muscle weakness (generalized)  Other symptoms and signs involving the musculoskeletal system  Cervicalgia  Abnormal posture  Acute pain of right knee  Problem List Patient Active Problem List   Diagnosis Date Noted   Acute CVA (cerebrovascular accident) ([place]) 11/11/2020   Normocytic anemia  10/15/2019   Dysphagia 09/17/2019   Costochondritis 06/07/2019   Seasonal allergic rhinitis due to pollen 11/03/2018   Arthropathy of cervical facet joint 11/01/2018   Neural foraminal stenosis of cervical spine 11/01/2018   Atypical chest pain 10/24/2018   Adjustment disorder with mixed anxiety and depressed mood 09/05/2018   Insomnia 03/03/2018   S/P CABG x 4 01/10/2018   Hyperlipidemia 01/09/2018   Unstable angina ([place]) 01/05/2018   CAD (coronary artery disease), native coronary artery 12/28/2017   Asymptomatic microscopic hematuria 11/14/2017   Iron deficiency anemia 09/05/2017   Cervicalgia 12/18/2015   Benign essential HTN 04/08/2015   Metatarsalgia of right foot 02/11/2015    Percival Spanish, PT, MPT 12/30/2020, 8:05 PM  [facility] [address]  [place], [place], [address] Phone: [phone]   Fax:  [phone]  Name: KETA VANVALKENBURGH MRN: [id number] Date of Birth: [birth year]

## 2021-01-02 ENCOUNTER — Encounter: Payer: Medicare Other | Admitting: Physical Therapy

## 2021-01-06 ENCOUNTER — Encounter: Payer: Medicare Other | Admitting: Physical Therapy

## 2021-01-08 ENCOUNTER — Encounter: Payer: Self-pay | Admitting: Adult Health

## 2021-01-08 ENCOUNTER — Ambulatory Visit (INDEPENDENT_AMBULATORY_CARE_PROVIDER_SITE_OTHER): Payer: Medicare Other | Admitting: Adult Health

## 2021-01-08 ENCOUNTER — Other Ambulatory Visit: Payer: Self-pay

## 2021-01-08 ENCOUNTER — Inpatient Hospital Stay: Payer: Medicaid Other | Admitting: Adult Health

## 2021-01-08 VITALS — BP 122/84 | HR 66 | Ht 62.0 in | Wt 149.4 lb

## 2021-01-08 DIAGNOSIS — E785 Hyperlipidemia, unspecified: Secondary | ICD-10-CM

## 2021-01-08 DIAGNOSIS — I10 Essential (primary) hypertension: Secondary | ICD-10-CM

## 2021-01-08 DIAGNOSIS — M542 Cervicalgia: Secondary | ICD-10-CM

## 2021-01-08 DIAGNOSIS — I639 Cerebral infarction, unspecified: Secondary | ICD-10-CM

## 2021-01-08 DIAGNOSIS — R400 Somnolence: Secondary | ICD-10-CM

## 2021-01-08 DIAGNOSIS — G8929 Other chronic pain: Secondary | ICD-10-CM

## 2021-01-08 DIAGNOSIS — M549 Dorsalgia, unspecified: Secondary | ICD-10-CM

## 2021-01-08 NOTE — Patient Instructions (Signed)
Continue aspirin 325 mg daily  and atorvastatin 40 mg daily for secondary stroke prevention  Continue to follow up with PCP regarding cholesterol and blood pressure management  Maintain strict control of hypertension with blood pressure goal below 130/90 and cholesterol with LDL cholesterol (bad cholesterol) goal below 70 mg/dL.   Continue working with therapies for continued neck, shoulder and headaches - may need to follow back up with orthopedics to further evaluate possible other causes contributing to continued pain       Thank you for coming to see [place] at [facility]. I hope we have been able to provide you high quality care today.  You may receive a patient satisfaction survey over the next few weeks. We would appreciate your feedback and comments so that we may continue to improve ourselves and the health of our patients.

## 2021-01-08 NOTE — Progress Notes (Signed)
[facility] [address] [place].  [address] [phone]       HOSPITAL FOLLOW UP NOTE  Ms. Diana [name] Date of Birth:  Apr 17, 1956 Medical Record Number:  [id number]   Reason for Referral:  hospital stroke follow up    SUBJECTIVE:   CHIEF COMPLAINT:  Hospital stroke follow-up Chief Complaint  Patient presents with   Follow-up    Rm 2 alone here for Hsp f/u pt reports she has been doing ok shoulder, dull head ache,and neck pain have been more noticeable.      HPI:   Ms. Diana [name] is a 65 y.o. female with history of CAD status post CABG in 2019, hypertension, hyperlipidemia, depression admitted on 11/10/2020 for lightheadedness, generalized weakness, difficulty walking, slurred speech and several falls at home.  Personally reviewed hospitalization pertinent progress notes, lab work and imaging.  Evaluated by Dr. Roda Shutters for right frontal CR infarct likely secondary to small vessel disease possible incidental finding with residual lethargy, generalized weakness and left giveaway weakness. MR brain also showed evidence of old pontine infarct.  MRA and carotid Dopplers unremarkable.  EF 60 to 65%.  LDL 68 on atorvastatin 40 mg daily.  A1c 5.7.  Recommended DAPT for 3 weeks and Plavix alone as on aspirin PTA although discharged on aspirin 325mg  daily.  C/o ongoing neck pain with CT scan significant for C1 fracture although no evidence of fractures on MRI.  PT/OT recommended outpatient PT  Today, 01/08/2021, Ms. Olds is being seen for hospital follow-up unaccompanied.  Overall doing well without new stroke/TIA symptoms.  Continues to experience shoulder and neck pain as well as headache currently working with PT which has been ongoing even prior to her stroke.  Reports being evaluated by orthopedics although unable to view via epic.  Thankfully, she has not had any recent additional falls.  Remains on aspirin 325 mg daily and atorvastatin 40 mg daily.  Blood  pressure today 122/84.  She also complains of excessive daytime fatigue and snoring.  She requests evaluation for possible sleep apnea.  No further neurologic concerns at this time.      ROS:   14 system review of systems performed and negative with exception of those listed in HPI  PMH:  Past Medical History:  Diagnosis Date   Allergy    Arthritis    feet   Bronchitis 10/30/2016   Carpal tunnel syndrome    Coronary artery disease    GERD (gastroesophageal reflux disease)    Hyperlipidemia    Hypertension    Non-ST elevation (NSTEMI) myocardial infarction ([facility]) 10/30/2016   NSTEMI (non-ST elevated myocardial infarction) ([facility]) 10/30/2016    PSH:  Past Surgical History:  Procedure Laterality Date   CARPAL TUNNEL RELEASE Left 02/12/2016   Procedure: LEFT CARPAL TUNNEL RELEASE;  Surgeon: 02/14/2016, MD;  Location: [facility];  Service: Orthopedics;  Laterality: Left;  FAB   CORONARY ARTERY BYPASS GRAFT N/A 01/10/2018   Procedure: CORONARY ARTERY BYPASS GRAFTING (CABG) TIMES 4, USING LEFT INTERNAL MAMMARY ARTERY TO OM1  AND ENDOSCOPICALLY HARVESTED RIGHT SAPHENOUS VEIN TO DIAGONAL, AND SEQUENTIALLY TO OM2 AND DISTAL CIRCUMFLEX;  Surgeon: 01/12/2018, MD;  Location: [facility];  Service: Open Heart Surgery;  Laterality: N/A;   LEFT HEART CATH AND CORONARY ANGIOGRAPHY N/A 01/05/2018   Procedure: LEFT HEART CATH AND CORONARY ANGIOGRAPHY;  Surgeon: 03/07/2018, MD;  Location: [facility];  Service: Cardiovascular;  Laterality: N/A;   TEE WITHOUT  CARDIOVERSION N/A 01/10/2018   Procedure: TRANSESOPHAGEAL ECHOCARDIOGRAM (TEE);  Surgeon: Delight Ovens, MD;  Location: [facility];  Service: Open Heart Surgery;  Laterality: N/A;   TONSILLECTOMY     TUBAL LIGATION     WRIST SURGERY Right     Social History:  Social History   Socioeconomic History   Marital status: Single    Spouse name: Alcario Drought   Number of children: 2   Years of education: 12th grade   Highest education  level: Not on file  Occupational History   Occupation: Warden/ranger: [employer]  Tobacco Use   Smoking status: Never   Smokeless tobacco: Never  Vaping Use   Vaping Use: Never used  Substance and Sexual Activity   Alcohol use: No   Drug use: No   Sexual activity: Not Currently  Other Topics Concern   Not on file  Social History Narrative   Divorced from her husband. She has two adult sons from that union . One lives in [place], the other lives in [place] (as a Solicitor) following Office manager.   Married Alcario Drought 05/2014.   Social Determinants of Health   Financial Resource Strain: Not on file  Food Insecurity: Not on file  Transportation Needs: Not on file  Physical Activity: Not on file  Stress: Not on file  Social Connections: Not on file  Intimate Partner Violence: Not on file    Family History:  Family History  Problem Relation Age of Onset   Thyroid disease Mother    Hypertension Mother    Brain cancer Sister    HIV Sister    Healthy Brother    Liver disease Brother     Medications:   Current Outpatient Medications on File Prior to Visit  Medication Sig Dispense Refill   acetaminophen (TYLENOL) 500 MG tablet Take 500 mg by mouth every 6 (six) hours as needed for headache.     Adhesive Tape (MUELLER KINESIOLOGY) TAPE Apply as directed to reduce muscle/joint pain     aspirin 325 MG tablet Take 1 tablet (325 mg total) by mouth daily. 30 tablet 11   atorvastatin (LIPITOR) 40 MG tablet Take 40 mg by mouth daily.     cetirizine (ZYRTEC) 10 MG tablet Take 10 mg by mouth daily.     DULoxetine (CYMBALTA) 60 MG capsule Take 60 mg by mouth daily.     ferrous gluconate (FERGON) 324 MG tablet Take 2 tablets by mouth daily.      hydrOXYzine (ATARAX/VISTARIL) 10 MG tablet Take 10 mg by mouth 3 (three) times daily as needed for anxiety.     Metoprolol Tartrate 75 MG TABS Take 75 mg by mouth every 12 (twelve) hours. 60 tablet 1   montelukast (SINGULAIR) 10 MG tablet  Take 10 mg by mouth at bedtime.     naproxen (NAPROSYN) 500 MG tablet Take 1 tablet (500 mg total) by mouth 2 (two) times daily with a meal. 60 tablet 2   pantoprazole (PROTONIX) 40 MG tablet Take 40 mg by mouth 2 (two) times daily.     polyethylene glycol powder (GLYCOLAX/MIRALAX) 17 GM/SCOOP powder Take 17 g by mouth every evening.     pregabalin (LYRICA) 150 MG capsule Take 150 mg by mouth 2 (two) times daily.     ranolazine (RANEXA) 500 MG 12 hr tablet Take 1,000 mg by mouth 2 (two) times daily.     traZODone (DESYREL) 50 MG tablet Take 25-50 mg by mouth at bedtime as  needed for sleep.     vitamin C (ASCORBIC ACID) 500 MG tablet Take 500 mg by mouth daily.     Vitamin D, Cholecalciferol, 50 MCG (2000 UT) CAPS Take 2,000 Units by mouth daily.     nitroGLYCERIN (NITROSTAT) 0.4 MG SL tablet Place 1 tablet (0.4 mg total) under the tongue every 5 (five) minutes as needed for chest pain. 25 tablet 3   [DISCONTINUED] losartan-hydrochlorothiazide (HYZAAR) 50-12.5 MG tablet Take 1 tablet by mouth daily.     No current facility-administered medications on file prior to visit.    Allergies:   Allergies  Allergen Reactions   Contrast Media [Iodinated Diagnostic Agents] Anaphylaxis    Had CT scan 12/14/17, had trouble breathing, throat swelled   Penicillins Rash and Other (See Comments)    PATIENT HAS HAD A PCN REACTION WITH IMMEDIATE RASH, FACIAL/TONGUE/THROAT SWELLING, SOB, OR LIGHTHEADEDNESS WITH HYPOTENSION:  #  #  YES  #  #  Has patient had a PCN reaction causing severe rash involving mucus membranes or skin necrosis:  no Has patient had a PCN reaction that required hospitalization:  no Has patient had a PCN reaction occurring within the last 10 years: no    Methocarbamol Nausea Only and Other (See Comments)    headache   Codeine Anxiety    Dizziness and headache      OBJECTIVE:  Physical Exam  Vitals:   01/08/21 1510  BP: 122/84  Pulse: 66  SpO2: 97%  Weight: 149 lb 6 oz (67.8  kg)  Height: 5\' 2"  (1.575 m)   Body mass index is 27.32 kg/m. No results found.  General: well developed, well nourished, pleasant middle-age African-American female, seated, in no evident distress Head: head normocephalic and atraumatic.   Neck: supple with no carotid or supraclavicular bruits Cardiovascular: regular rate and rhythm, no murmurs Musculoskeletal: no deformity Skin:  no rash/petichiae Vascular:  Normal pulses all extremities   Neurologic Exam Mental Status: Awake and fully alert.  Fluent speech and language.  Oriented to place and time. Recent and remote memory intact. Attention span, concentration and fund of knowledge appropriate. Mood and affect appropriate.  Cranial Nerves: Fundoscopic exam reveals sharp disc margins. Pupils equal, briskly reactive to light. Extraocular movements full without nystagmus. Visual fields full to confrontation. Hearing intact. Facial sensation intact. Face, tongue, palate moves normally and symmetrically.  Motor: Normal bulk and tone. Normal strength in all tested extremity muscles Sensory.: intact to touch , pinprick , position and vibratory sensation.  Coordination: Rapid alternating movements normal in all extremities. Finger-to-nose and heel-to-shin performed accurately bilaterally. Gait and Station: Arises from chair without difficulty. Stance is normal. Gait demonstrates normal stride length and balance without use of assistive device.  Reflexes: 1+ and symmetric. Toes downgoing.     NIHSS  0 Modified Rankin  0      ASSESSMENT: Diana [name] is a 65 y.o. year old female with right frontal CR infarct on 11/10/2020 secondary to small vessel disease possibly incidental finding after presenting with lightheadedness, generalized weakness, difficulty walking, slurred speech and several falls at home.   Vascular risk factors include HTN, HLD, and CAD s/p CABG 2019.      PLAN:  R CR stroke : Continue aspirin 325 mg daily  and  atorvastatin 40 mg daily for secondary stroke prevention.  Discussed secondary stroke prevention measures and importance of close PCP follow up for aggressive stroke risk factor management. I have gone over the pathophysiology of stroke, warning signs and  symptoms, risk factors and their management in some detail with instructions to go to the closest emergency room for symptoms of concern. HTN: BP goal <130/90.  Stable on current regimen per PCP HLD: LDL goal <70. Recent LDL 68 on atorvastatin 40 mg daily.  Chronic neck pain: Continue to work with PT. may need to schedule follow-up with orthopedics although unable to view note via epic (may not had any further recommendations). Eval by neurosurgery during admission without recommendations.  Continue to follow with PCP for further evaluation - if all remains unremarkable, may benefit from establishing care with pain clinic At risk for sleep apnea: Referral placed to [facility] sleep clinic for further evaluation of possible sleep apnea      CC:  [facility] provider: Dr. Pearlean Brownie PCP: Porfirio Oar, PA    I spent 49 minutes of face-to-face and non-face-to-face time with patient.  This included previsit chart review including review of recent hospitalization, lab review, study review, order entry, electronic health record documentation, patient education regarding recent stroke and likely etiology, secondary stroke prevention measures and importance of managing stroke risk factors, chronic neck pain and answered all questions to patient satisfaction   Ihor Austin, AGNP-BC  [facility] [address] [place], [place] [address]  Phone [phone] Fax [phone] Note: This document was prepared with digital dictation and possible smart phrase technology. Any transcriptional errors that result from this process are unintentional.

## 2021-01-09 ENCOUNTER — Encounter: Payer: Medicare Other | Admitting: Physical Therapy

## 2021-01-12 NOTE — Progress Notes (Signed)
I agree with the above plan 

## 2021-01-13 ENCOUNTER — Ambulatory Visit: Payer: Medicare Other | Admitting: Physical Therapy

## 2021-01-13 ENCOUNTER — Encounter: Payer: Self-pay | Admitting: Physical Therapy

## 2021-01-13 ENCOUNTER — Other Ambulatory Visit: Payer: Self-pay

## 2021-01-13 DIAGNOSIS — R296 Repeated falls: Secondary | ICD-10-CM

## 2021-01-13 DIAGNOSIS — R2681 Unsteadiness on feet: Secondary | ICD-10-CM

## 2021-01-13 DIAGNOSIS — M542 Cervicalgia: Secondary | ICD-10-CM

## 2021-01-13 DIAGNOSIS — M25561 Pain in right knee: Secondary | ICD-10-CM

## 2021-01-13 DIAGNOSIS — R29898 Other symptoms and signs involving the musculoskeletal system: Secondary | ICD-10-CM

## 2021-01-13 DIAGNOSIS — M6281 Muscle weakness (generalized): Secondary | ICD-10-CM

## 2021-01-13 DIAGNOSIS — R293 Abnormal posture: Secondary | ICD-10-CM

## 2021-01-13 NOTE — Therapy (Signed)
[facility] [address]  [place], [place], [address] Phone: [phone]   Fax:  [phone]  Physical Therapy Treatment  Patient Details  Name: Diana [name] MRN: [id number] Date of Birth: 09/10/1955 Referring Provider (PT): Harrison Mons, [place] & Melrose Nakayama, MD   Encounter Date: 01/13/2021   PT End of Session - 01/13/21 1534     Visit Number 14    Number of Visits 25    Date for PT Re-Evaluation 02/03/21    Authorization Type UHC Medicare & Medicaid [place]    Progress Note Due on Visit 19   Re-Eval/Recert completed on visit #9 - 12/09/20   PT Start Time 16    PT Stop Time 1632    PT Time Calculation (min) 58 min    Activity Tolerance Patient tolerated treatment well    Behavior During Therapy [facility] for tasks assessed/performed             Past Medical History:  Diagnosis Date   Allergy    Arthritis    feet   Bronchitis 10/30/2016   Carpal tunnel syndrome    Coronary artery disease    GERD (gastroesophageal reflux disease)    Hyperlipidemia    Hypertension    Non-ST elevation (NSTEMI) myocardial infarction ([place]) 10/30/2016   NSTEMI (non-ST elevated myocardial infarction) ([place]) 10/30/2016    Past Surgical History:  Procedure Laterality Date   CARPAL TUNNEL RELEASE Left 02/12/2016   Procedure: LEFT CARPAL TUNNEL RELEASE;  Surgeon: Daryll Brod, MD;  Location: [place];  Service: Orthopedics;  Laterality: Left;  FAB   CORONARY ARTERY BYPASS GRAFT N/A 01/10/2018   Procedure: CORONARY ARTERY BYPASS GRAFTING (CABG) TIMES 4, USING LEFT INTERNAL MAMMARY ARTERY TO OM1  AND ENDOSCOPICALLY HARVESTED RIGHT SAPHENOUS VEIN TO DIAGONAL, AND SEQUENTIALLY TO OM2 AND DISTAL CIRCUMFLEX;  Surgeon: Grace Isaac, MD;  Location: [place];  Service: Open Heart Surgery;  Laterality: N/A;   LEFT HEART CATH AND CORONARY ANGIOGRAPHY N/A 01/05/2018   Procedure: LEFT HEART CATH AND CORONARY ANGIOGRAPHY;  Surgeon: Troy Sine, MD;  Location: [place] [facility];  Service: Cardiovascular;  Laterality: N/A;   TEE WITHOUT CARDIOVERSION N/A 01/10/2018   Procedure: TRANSESOPHAGEAL ECHOCARDIOGRAM (TEE);  Surgeon: Grace Isaac, MD;  Location: [place];  Service: Open Heart Surgery;  Laterality: N/A;   TONSILLECTOMY     TUBAL LIGATION     WRIST SURGERY Right     There were no vitals filed for this visit.   Subjective Assessment - 01/13/21 1538     Subjective Pt reports she saw the neurologist as the f/u from her CVA. She states the neurologist has referred her to see a neurosurgeon for her neck. Pt feels her balance seems to be getting better but still feels weak (in her core) and is still having neck and upper shoulder pain.    Pertinent History CABG x 4 - 01/10/18    Diagnostic tests 11/10/20 - cervical MRI: 1. No acute abnormality of the cervical spine.  2. Mild spinal canal stenosis at C3-C4 secondary to small disc bulge.  3. Moderate left C5-C6 neural foraminal stenosis.  4. Mild bilateral C4-C5 neural foraminal stenosis.    Patient Stated Goals "to reduce the muscle tightness and feel steadier on my feet"    Currently in Pain? Yes    Pain Score 6     Pain Location Neck   & shoulders   Pain Orientation Right;Left  Pain Descriptors / Indicators Pressure    Pain Type Chronic pain    Pain Frequency Constant                [facility] PT Assessment - 01/13/21 1534       Assessment   Medical Diagnosis Cervical spondylosis; R knee chondromalacia patella    Referring Provider (PT) Harrison Mons, PA-C & Melrose Nakayama, MD      Strength   Right Shoulder Flexion 4-/5    Right Shoulder ABduction 4-/5    Right Shoulder Internal Rotation 4/5    Right Shoulder External Rotation 4-/5    Left Shoulder Flexion 4-/5    Left Shoulder ABduction 4-/5    Left Shoulder Internal Rotation 4/5    Left Shoulder External Rotation 4-/5    Right Hip Flexion 4-/5    Right Hip Extension 3-/5    Right Hip External  Rotation  4/5    Right Hip Internal Rotation 4/5    Right Hip ABduction 4-/5    Right Hip ADduction 3-/5    Left Hip Flexion 3+/5    Left Hip Extension 3-/5    Left Hip External Rotation 3+/5    Left Hip Internal Rotation 3+/5    Left Hip ABduction 3+/5    Left Hip ADduction 3-/5    Right Knee Flexion 4+/5    Right Knee Extension 4+/5    Left Knee Flexion 4/5    Left Knee Extension 4/5    Right Ankle Dorsiflexion 4/5    Left Ankle Dorsiflexion 4/5      Ambulation/Gait   Gait velocity 4.37 ft/sec      Standardized Balance Assessment   Five times sit to stand comments  13.44 sec   norm for ages 30-69 = 11.04 sec   10 Meter Walk 7.50 sec      Functional Gait  Assessment   Gait Level Surface Walks 20 ft in less than 5.5 sec, no assistive devices, good speed, no evidence for imbalance, normal gait pattern, deviates no more than 6 in outside of the 12 in walkway width.    Change in Gait Speed Able to change speed, demonstrates mild gait deviations, deviates 6-10 in outside of the 12 in walkway width, or no gait deviations, unable to achieve a major change in velocity, or uses a change in velocity, or uses an assistive device.    Gait with Horizontal Head Turns Performs head turns smoothly with no change in gait. Deviates no more than 6 in outside 12 in walkway width    Gait with Vertical Head Turns Performs head turns with no change in gait. Deviates no more than 6 in outside 12 in walkway width.    Gait and Pivot Turn Pivot turns safely within 3 sec and stops quickly with no loss of balance.    Step Over Obstacle Is able to step over 2 stacked shoe boxes taped together (9 in total height) without changing gait speed. No evidence of imbalance.    Gait with Narrow Base of Support Is able to ambulate for 10 steps heel to toe with no staggering.    Gait with Eyes Closed Walks 20 ft, slow speed, abnormal gait pattern, evidence for imbalance, deviates 10-15 in outside 12 in walkway width.  Requires more than 9 sec to ambulate 20 ft.    Ambulating Backwards Walks 20 ft, uses assistive device, slower speed, mild gait deviations, deviates 6-10 in outside 12 in walkway width.    Steps Alternating  feet, no rail.    Total Score 26    FGA comment: 25-28 = low risk fall                           [facility] Adult PT Treatment/Exercise - 01/13/21 1534       Self-Care   Self-Care Posture    Posture Instruction in proper posture with daily household chores such as sweeping & mopping, as well as recommended sleeping positions.      Knee/Hip Exercises: Aerobic   Nustep L5 x 6 min (UE/LE)      Shoulder Exercises: Supine   Horizontal ABduction Both;10 reps;Strengthening;Theraband    Theraband Level (Shoulder Horizontal ABduction) Level 1 (Yellow)    Horizontal ABduction Limitations cues for TrA activation & scap retraction      Modalities   Modalities Moist Heat      Moist Heat Therapy   Number Minutes Moist Heat 10 Minutes    Moist Heat Location Cervical;Shoulder   Thoracic spine                     PT Short Term Goals - 11/18/20 1536       PT SHORT TERM GOAL #1   Title Patient will be independent with initial HEP    Status Achieved   11/18/20              PT Long Term Goals - 01/13/21 1537       PT LONG TERM GOAL #1   Title Patient will be independent with ongoing/advanced HEP +/- gym program for self-management at home    Status Partially Met    Target Date 02/03/21      PT LONG TERM GOAL #2   Title Patient will demonstrate improved B LE strength to >/= 4/5 for improved stability and ease of mobility    Status Partially Met    Target Date 02/03/21      PT LONG TERM GOAL #3   Title Patient will decrease 5x STS time to </= 11.4 sec per age-appropriate norms    Baseline 23.09 sec    Status On-going   11/25/20 - reduced to 15.9 sec; 01/13/21 - reduced to 13.44 sec   Target Date 02/03/21      PT LONG TERM GOAL #4   Title Patient will  improve FGA score to >/= 25/30 to improve gait stability and reduce risk for falls    Baseline 18/30    Status Achieved   01/13/21 - FGA = 26/30     PT LONG TERM GOAL #5   Title Patient will report no further instances of falls    Status Partially Met   01/13/21 - no recent falls reported     PT LONG TERM GOAL #6   Title Improve posture and alignment with patient to demonstrate improved upright posture with posterior shoulder girdle engaged    Status Partially Met      PT LONG TERM GOAL #7   Title Decrease neck, shoulder and R knee pain by >/= 50% allowing patient increased ease of daily household tasks    Status On-going      PT LONG TERM GOAL #8   Title Patient to improve cervical and B shoulder AROM to WFL/WNL without pain provocation    Status On-going      PT LONG TERM GOAL  #9   TITLE Patient to report ability perform ADLs and household tasks without neck  or knee pain interference    Status On-going                   Plan - 01/13/21 1537     Clinical Impression Statement Pam reports the f/u with the neurologist following her CVA went well but the neurologist is referring her to a neurosurgeon or having her f/u with Dr. Rhona Raider regarding her neck pain. She also notes less shakiness and better balance since her PCP stopped one of her meds. Pt reports no recent falls and continues to demonstrate gains with standardized balance testing with 5x STS time reduced to 13.44 sec and FGA increased to 26/30 - LTG #3 progressing, LTG #4 now met, and LTG #5 partially met. Her UE strength is improving but she continues to lack consistency with postural awareness/engagement. LE strength remains weakest proximally contributing to continued core weakness, therefore will plan to continue core and proximal UE/LE strengthening in upcoming visits. Addressed pt questions related to hooklying exercises from HEP. Also reviewed posture related to sleeping positions as well as household chores such as  sweeping and mopping and will plan to further elaborate on this next visit.    Comorbidities CAD s/p NSTEMI 10/30/16 & CABG x 4 01/10/18; HTN; cervicalgia; carpal tunnel syndrome s/p CTR 02/12/16; GERD; bronchitis    Rehab Potential Good    PT Frequency 2x / week    PT Duration 6 weeks    PT Treatment/Interventions ADLs/Self Care Home Management;Cryotherapy;Electrical Stimulation;Iontophoresis 69m/ml Dexamethasone;Moist Heat;Ultrasound;Gait training;Stair training;Functional mobility training;Therapeutic activities;Therapeutic exercise;Balance training;Neuromuscular re-education;Patient/family education;Manual techniques;Scar mobilization;Passive range of motion;Dry needling;Energy conservation;Taping;Vasopneumatic Device    PT Next Visit Plan review initial HEP for cervical pain/LOM; posture & body mechanics education; progress core & proximal UE/LE strengthening as tolerated; progress balance activities    PT Home Exercise Plan Access Code: [email or web address](6/7), [email or web address](6/23), [email or web address](7/12), [email or web address] (7/14, updated 7/26)    Consulted and Agree with Plan of Care Patient             Patient will benefit from skilled therapeutic intervention in order to improve the following deficits and impairments:  Cardiopulmonary status limiting activity, Decreased activity tolerance, Decreased mobility, Decreased range of motion, Decreased scar mobility, Decreased strength, Hypomobility, Increased fascial restricitons, Increased muscle spasms, Impaired perceived functional ability, Impaired flexibility, Impaired UE functional use, Improper body mechanics, Postural dysfunction, Pain, Decreased balance, Decreased endurance, Difficulty walking, Decreased safety awareness  Visit Diagnosis: Repeated falls  Unsteadiness on feet  Muscle weakness (generalized)  Other symptoms and signs involving the musculoskeletal system  Cervicalgia  Abnormal posture  Acute pain of right knee     Problem List Patient  Active Problem List   Diagnosis Date Noted   Acute CVA (cerebrovascular accident) ([place] 11/11/2020   Normocytic anemia 10/15/2019   Dysphagia 09/17/2019   Costochondritis 06/07/2019   Seasonal allergic rhinitis due to pollen 11/03/2018   Arthropathy of cervical facet joint 11/01/2018   Neural foraminal stenosis of cervical spine 11/01/2018   Atypical chest pain 10/24/2018   Adjustment disorder with mixed anxiety and depressed mood 09/05/2018   Insomnia 03/03/2018   S/P CABG x 4 01/10/2018   Hyperlipidemia 01/09/2018   Unstable angina (HCC) 01/05/2018   CAD (coronary artery disease), native coronary artery 12/28/2017   Asymptomatic microscopic hematuria 11/14/2017   Iron deficiency anemia 09/05/2017   Cervicalgia 12/18/2015   Benign essential HTN 04/08/2015   Metatarsalgia of right foot 02/11/2015    JPercival Spanish PT, MPT 01/13/2021, 5:00 PM  [facility] [address]  [place], [place], [address] Phone: [phone]   Fax:  [phone]  Name: TERIE LEAR MRN: [id number] Date of Birth: Oct 27, 1955

## 2021-01-16 ENCOUNTER — Encounter: Payer: Medicare Other | Admitting: Physical Therapy

## 2021-01-20 ENCOUNTER — Encounter: Payer: Self-pay | Admitting: Physical Therapy

## 2021-01-20 ENCOUNTER — Ambulatory Visit: Payer: Medicare Other | Admitting: Physical Therapy

## 2021-01-20 ENCOUNTER — Other Ambulatory Visit: Payer: Self-pay

## 2021-01-20 DIAGNOSIS — M25561 Pain in right knee: Secondary | ICD-10-CM

## 2021-01-20 DIAGNOSIS — R293 Abnormal posture: Secondary | ICD-10-CM

## 2021-01-20 DIAGNOSIS — M6281 Muscle weakness (generalized): Secondary | ICD-10-CM

## 2021-01-20 DIAGNOSIS — R2681 Unsteadiness on feet: Secondary | ICD-10-CM

## 2021-01-20 DIAGNOSIS — R29898 Other symptoms and signs involving the musculoskeletal system: Secondary | ICD-10-CM

## 2021-01-20 DIAGNOSIS — R296 Repeated falls: Secondary | ICD-10-CM | POA: Diagnosis not present

## 2021-01-20 DIAGNOSIS — M542 Cervicalgia: Secondary | ICD-10-CM

## 2021-01-20 NOTE — Therapy (Signed)
[facility] [address]  [place], [place], [address] Phone: [phone]   Fax:  [phone]  Physical Therapy Treatment  Patient Details  Name: Diana [name] MRN: [id number] Date of Birth: 01-24-1956 Referring Provider (PT): Harrison Mons, [place] & Melrose Nakayama, MD   Encounter Date: 01/20/2021   PT End of Session - 01/20/21 1535     Visit Number 15    Number of Visits 25    Date for PT Re-Evaluation 02/03/21    Authorization Type UHC Medicare & Medicaid [place]    Progress Note Due on Visit 19   Re-Eval/Recert completed on visit #9 - 12/09/20   PT Start Time 1535    PT Stop Time 1629    PT Time Calculation (min) 54 min    Activity Tolerance Patient tolerated treatment well    Behavior During Therapy [facility] for tasks assessed/performed             Past Medical History:  Diagnosis Date   Allergy    Arthritis    feet   Bronchitis 10/30/2016   Carpal tunnel syndrome    Coronary artery disease    GERD (gastroesophageal reflux disease)    Hyperlipidemia    Hypertension    Non-ST elevation (NSTEMI) myocardial infarction ([place]) 10/30/2016   NSTEMI (non-ST elevated myocardial infarction) ([place]) 10/30/2016    Past Surgical History:  Procedure Laterality Date   CARPAL TUNNEL RELEASE Left 02/12/2016   Procedure: LEFT CARPAL TUNNEL RELEASE;  Surgeon: Daryll Brod, MD;  Location: [place];  Service: Orthopedics;  Laterality: Left;  FAB   CORONARY ARTERY BYPASS GRAFT N/A 01/10/2018   Procedure: CORONARY ARTERY BYPASS GRAFTING (CABG) TIMES 4, USING LEFT INTERNAL MAMMARY ARTERY TO OM1  AND ENDOSCOPICALLY HARVESTED RIGHT SAPHENOUS VEIN TO DIAGONAL, AND SEQUENTIALLY TO OM2 AND DISTAL CIRCUMFLEX;  Surgeon: Grace Isaac, MD;  Location: [place];  Service: Open Heart Surgery;  Laterality: N/A;   LEFT HEART CATH AND CORONARY ANGIOGRAPHY N/A 01/05/2018   Procedure: LEFT HEART CATH AND CORONARY ANGIOGRAPHY;  Surgeon: Troy Sine, MD;  Location: [place] [facility];  Service: Cardiovascular;  Laterality: N/A;   TEE WITHOUT CARDIOVERSION N/A 01/10/2018   Procedure: TRANSESOPHAGEAL ECHOCARDIOGRAM (TEE);  Surgeon: Grace Isaac, MD;  Location: [place];  Service: Open Heart Surgery;  Laterality: N/A;   TONSILLECTOMY     TUBAL LIGATION     WRIST SURGERY Right     There were no vitals filed for this visit.   Subjective Assessment - 01/20/21 1538     Pertinent History CABG x 4 - 01/10/18    Diagnostic tests 11/10/20 - cervical MRI: 1. No acute abnormality of the cervical spine.  2. Mild spinal canal stenosis at C3-C4 secondary to small disc bulge.  3. Moderate left C5-C6 neural foraminal stenosis.  4. Mild bilateral C4-C5 neural foraminal stenosis.    Patient Stated Goals "to reduce the muscle tightness and feel steadier on my feet"    Currently in Pain? Yes    Pain Score 5     Pain Location Neck   & upper back   Pain Descriptors / Indicators Pressure    Pain Type Chronic pain    Pain Frequency Constant                               [facility] Adult PT Treatment/Exercise - 01/20/21 1535  Neck Exercises: Machines for Strengthening   UBE (Upper Arm Bike) L2.0 x 6 min (3' fwd/3' back)      Neck Exercises: Seated   Other Seated Exercise Cervical extension SNAGs with pillowcase10 x 3"    Other Seated Exercise Upper thoracic extension over back of chair with hands clasped behind neck 10 x 3"      Neck Exercises: Stretches   Other Neck Stretches R/L scalene stretch with strap over shoulder 2 x 30 sec      Shoulder Exercises: Seated   Extension Both;10 reps;Strengthening;Theraband    Theraband Level (Shoulder Extension) Level 1 (Yellow)    Extension Limitations cues for abd bracing & scap retraction/depression avoiding shoulder elevation/shrug    Row Both;10 reps;Strengthening;Theraband    Theraband Level (Shoulder Row) Level 1 (Yellow)    Row Limitations cues for abd bracing & scap  retraction/depression tucking elbows into ribs, avoiding shoulder elevation/shrug      Modalities   Modalities Moist Heat      Moist Heat Therapy   Number Minutes Moist Heat 15 Minutes    Moist Heat Location Cervical;Shoulder   Thoracic spine                   PT Education - 01/20/21 1614     Education Details Review & update of cervical, thoracic & shoulder HEP - Access Code: [email or web address]    Person(s) Educated Patient    Methods Explanation;Demonstration;Verbal cues;Tactile cues;Handout    Comprehension Verbalized understanding;Verbal cues required;Tactile cues required;Returned demonstration;Need further instruction              PT Short Term Goals - 11/18/20 1536       PT SHORT TERM GOAL #1   Title Patient will be independent with initial HEP    Status Achieved   11/18/20              PT Long Term Goals - 01/13/21 1537       PT LONG TERM GOAL #1   Title Patient will be independent with ongoing/advanced HEP +/- gym program for self-management at home    Status Partially Met    Target Date 02/03/21      PT LONG TERM GOAL #2   Title Patient will demonstrate improved B LE strength to >/= 4/5 for improved stability and ease of mobility    Status Partially Met    Target Date 02/03/21      PT LONG TERM GOAL #3   Title Patient will decrease 5x STS time to </= 11.4 sec per age-appropriate norms    Baseline 23.09 sec    Status On-going   11/25/20 - reduced to 15.9 sec; 01/13/21 - reduced to 13.44 sec   Target Date 02/03/21      PT LONG TERM GOAL #4   Title Patient will improve FGA score to >/= 25/30 to improve gait stability and reduce risk for falls    Baseline 18/30    Status Achieved   01/13/21 - FGA = 26/30     PT LONG TERM GOAL #5   Title Patient will report no further instances of falls    Status Partially Met   01/13/21 - no recent falls reported     PT LONG TERM GOAL #6   Title Improve posture and alignment with patient to demonstrate improved  upright posture with posterior shoulder girdle engaged    Status Partially Met      PT LONG TERM GOAL #7   Title Decrease  neck, shoulder and R knee pain by >/= 50% allowing patient increased ease of daily household tasks    Status On-going      PT LONG TERM GOAL #8   Title Patient to improve cervical and B shoulder AROM to WFL/WNL without pain provocation    Status On-going      PT LONG TERM GOAL  #9   TITLE Patient to report ability perform ADLs and household tasks without neck or knee pain interference    Status On-going                   Plan - 01/20/21 1543     Clinical Impression Statement Diana [name] reports continued pain in neck and upper back and states she has been focusing on this with her HEP. Reviewed cervical HEP providing correction and/or clarification as indicated to achieve proper technique and positioning with exercises. Pt noting more restriction from upper back than neck when it comes to extension, therefore added thoracic extension over back of chair with pt able to provide good return demonstration. Diana [name] feels like she will need to continue beyond the end of her current POC to continue ROM and strengthening for both upper body and lumbopelvic/LE strengthening. Since she has not been focusing as much on lower body with HEP recently, encouraged her to alternate days between upper and lower body so as not to leave out half of the HEP as well as to allow for rest days on off days - pt verbalized understanding.    Comorbidities CAD s/p NSTEMI 10/30/16 & CABG x 4 01/10/18; HTN; cervicalgia; carpal tunnel syndrome s/p CTR 02/12/16; GERD; bronchitis    Rehab Potential Good    PT Frequency 2x / week    PT Duration 6 weeks    PT Treatment/Interventions ADLs/Self Care Home Management;Cryotherapy;Electrical Stimulation;Iontophoresis 56m/ml Dexamethasone;Moist Heat;Ultrasound;Gait training;Stair training;Functional mobility training;Therapeutic activities;Therapeutic exercise;Balance  training;Neuromuscular re-education;Patient/family education;Manual techniques;Scar mobilization;Passive range of motion;Dry needling;Energy conservation;Taping;Vasopneumatic Device    PT Next Visit Plan posture & body mechanics education; progress core & proximal UE/LE strengthening as tolerated; progress balance activities    PT Home Exercise Plan Access Code: [email or web address](6/7), [email or web address](6/23), [email or web address](7/12), [email or web address] (7/14, updated 7/26 & 8/22)    Consulted and Agree with Plan of Care Patient             Patient will benefit from skilled therapeutic intervention in order to improve the following deficits and impairments:  Cardiopulmonary status limiting activity, Decreased activity tolerance, Decreased mobility, Decreased range of motion, Decreased scar mobility, Decreased strength, Hypomobility, Increased fascial restricitons, Increased muscle spasms, Impaired perceived functional ability, Impaired flexibility, Impaired UE functional use, Improper body mechanics, Postural dysfunction, Pain, Decreased balance, Decreased endurance, Difficulty walking, Decreased safety awareness  Visit Diagnosis: Repeated falls  Unsteadiness on feet  Muscle weakness (generalized)  Other symptoms and signs involving the musculoskeletal system  Cervicalgia  Abnormal posture  Acute pain of right knee     Problem List Patient Active Problem List   Diagnosis Date Noted   Acute CVA (cerebrovascular accident) ([place] 11/11/2020   Normocytic anemia 10/15/2019   Dysphagia 09/17/2019   Costochondritis 06/07/2019   Seasonal allergic rhinitis due to pollen 11/03/2018   Arthropathy of cervical facet joint 11/01/2018   Neural foraminal stenosis of cervical spine 11/01/2018   Atypical chest pain 10/24/2018   Adjustment disorder with mixed anxiety and depressed mood 09/05/2018   Insomnia 03/03/2018   S/P CABG x 4 01/10/2018   Hyperlipidemia 01/09/2018   Unstable angina (HCC)  01/05/2018   CAD (coronary  artery disease), native coronary artery 12/28/2017   Asymptomatic microscopic hematuria 11/14/2017   Iron deficiency anemia 09/05/2017   Cervicalgia 12/18/2015   Benign essential HTN 04/08/2015   Metatarsalgia of right foot 02/11/2015    Percival Spanish, PT, MPT 01/20/2021, 4:31 PM  [facility] [address]  [place], [place], [address] Phone: [phone]   Fax:  [phone]  Name: Diana [name] MRN: [id number] Date of Birth: [birth year]

## 2021-01-20 NOTE — Patient Instructions (Signed)
   Access Code: [email or web address] URL: [email or web address] Date: 01/20/2021 Prepared by: Glenetta Hew  Exercises Doorway Pec Stretch at 60 Degrees Abduction with Arm Straight - 2-3 x daily - 7 x weekly - 3 reps - 30 sec hold Single Arm Doorway Pec Stretch at 60 Elevation - 2-3 x daily - 7 x weekly - 3 reps - 30 sec hold Seated Gentle Upper Trapezius Stretch - 2-3 x daily - 7 x weekly - 3 reps - 30 sec hold Gentle Levator Scapulae Stretch - 2-3 x daily - 7 x weekly - 3 reps - 30 sec hold Seated Scalene Stretch with Towel - 2 x daily - 7 x weekly - 3 reps - 30 sec hold Mid-Lower Cervical Extension SNAG with Strap - 1 x daily - 7 x weekly - 2 sets - 10 reps - 3 sec hold Seated Assisted Cervical Rotation with Towel - 1 x daily - 7 x weekly - 2 sets - 10 reps - 3 sec hold Seated Shoulder Row with Anchored Resistance - 1 x daily - 7 x weekly - 2 sets - 10 reps - 3 hold hold Seated Shoulder Extension and Scapular Retraction with Resistance - 1 x daily - 7 x weekly - 2 sets - 10 reps - 3 sec hold Seated Thoracic Lumbar Extension with Pectoralis Stretch - 2-3 x daily - 7 x weekly - 2 sets - 10 reps - 3 sec hold

## 2021-01-22 ENCOUNTER — Encounter: Payer: Self-pay | Admitting: Physical Therapy

## 2021-01-22 ENCOUNTER — Other Ambulatory Visit: Payer: Self-pay

## 2021-01-22 ENCOUNTER — Ambulatory Visit: Payer: Medicare Other | Admitting: Physical Therapy

## 2021-01-22 DIAGNOSIS — R29898 Other symptoms and signs involving the musculoskeletal system: Secondary | ICD-10-CM

## 2021-01-22 DIAGNOSIS — R296 Repeated falls: Secondary | ICD-10-CM | POA: Diagnosis not present

## 2021-01-22 DIAGNOSIS — R2681 Unsteadiness on feet: Secondary | ICD-10-CM

## 2021-01-22 DIAGNOSIS — M6281 Muscle weakness (generalized): Secondary | ICD-10-CM

## 2021-01-22 DIAGNOSIS — R293 Abnormal posture: Secondary | ICD-10-CM

## 2021-01-22 DIAGNOSIS — M542 Cervicalgia: Secondary | ICD-10-CM

## 2021-01-22 DIAGNOSIS — M25561 Pain in right knee: Secondary | ICD-10-CM

## 2021-01-22 NOTE — Therapy (Signed)
[facility] [address]  [place], [place], [address] Phone: [phone]   Fax:  [phone]  Physical Therapy Treatment  Patient Details  Name: Diana [name] MRN: [id number] Date of Birth: 12-Sep-1955 Referring Provider (PT): Harrison Mons, [place] & [place] Nakayama, MD   Encounter Date: 01/22/2021   PT End of Session - 01/22/21 1417     Visit Number 16    Number of Visits 25    Date for PT Re-Evaluation 02/03/21    Authorization Type UHC Medicare & Medicaid [place]    Progress Note Due on Visit 19   Re-Eval/Recert completed on visit #9 - 12/09/20   PT Start Time 1417   Pt arrived late - thought her appt was for 2:20   PT Stop Time 1448    PT Time Calculation (min) 31 min    Activity Tolerance Patient tolerated treatment well    Behavior During Therapy [facility] for tasks assessed/performed             Past Medical History:  Diagnosis Date   Allergy    Arthritis    feet   Bronchitis 10/30/2016   Carpal tunnel syndrome    Coronary artery disease    GERD (gastroesophageal reflux disease)    Hyperlipidemia    Hypertension    Non-ST elevation (NSTEMI) myocardial infarction ([place]) 10/30/2016   NSTEMI (non-ST elevated myocardial infarction) ([place]) 10/30/2016    Past Surgical History:  Procedure Laterality Date   CARPAL TUNNEL RELEASE Left 02/12/2016   Procedure: LEFT CARPAL TUNNEL RELEASE;  Surgeon: Daryll Brod, MD;  Location: [place];  Service: Orthopedics;  Laterality: Left;  FAB   CORONARY ARTERY BYPASS GRAFT N/A 01/10/2018   Procedure: CORONARY ARTERY BYPASS GRAFTING (CABG) TIMES 4, USING LEFT INTERNAL MAMMARY ARTERY TO OM1  AND ENDOSCOPICALLY HARVESTED RIGHT SAPHENOUS VEIN TO DIAGONAL, AND SEQUENTIALLY TO OM2 AND DISTAL CIRCUMFLEX;  Surgeon: Grace Isaac, MD;  Location: [place];  Service: Open Heart Surgery;  Laterality: N/A;   LEFT HEART CATH AND CORONARY ANGIOGRAPHY N/A 01/05/2018   Procedure: LEFT HEART  CATH AND CORONARY ANGIOGRAPHY;  Surgeon: Troy Sine, MD;  Location: [facility];  Service: Cardiovascular;  Laterality: N/A;   TEE WITHOUT CARDIOVERSION N/A 01/10/2018   Procedure: TRANSESOPHAGEAL ECHOCARDIOGRAM (TEE);  Surgeon: Grace Isaac, MD;  Location: [place];  Service: Open Heart Surgery;  Laterality: N/A;   TONSILLECTOMY     TUBAL LIGATION     WRIST SURGERY Right     There were no vitals filed for this visit.   Subjective Assessment - 01/22/21 1422     Subjective Pt reports she is not having much pain but has been having more cramps in her legs both at night and during the day. Also notes he has been stumbling more the past few days.    Pertinent History CABG x 4 - 01/10/18    Diagnostic tests 11/10/20 - cervical MRI: 1. No acute abnormality of the cervical spine.  2. Mild spinal canal stenosis at C3-C4 secondary to small disc bulge.  3. Moderate left C5-C6 neural foraminal stenosis.  4. Mild bilateral C4-C5 neural foraminal stenosis.    Patient Stated Goals "to reduce the muscle tightness and feel steadier on my feet"    Currently in Pain? No/denies  [facility] Adult PT Treatment/Exercise - 01/22/21 1417       Self-Care   Self-Care Posture    Posture Reviewed education for proper posture and body mechanics with daily mobility and household chores such as sweeping & mopping, as well as recommended sleeping positions.      Knee/Hip Exercises: Stretches   Passive Hamstring Stretch Right;Left;2 reps;30 seconds    Passive Hamstring Stretch Limitations seated hip hinge +/- strap      Knee/Hip Exercises: Aerobic   Recumbent Bike L2-1 x 4 min                    PT Education - 01/22/21 1445     Education Details Posture and body mechanics education    Person(s) Educated Patient    Methods Explanation;Demonstration;Verbal cues;Handout    Comprehension Verbalized understanding;Verbal cues required;Need further  instruction              PT Short Term Goals - 11/18/20 1536       PT SHORT TERM GOAL #1   Title Patient will be independent with initial HEP    Status Achieved   11/18/20              PT Long Term Goals - 01/13/21 1537       PT LONG TERM GOAL #1   Title Patient will be independent with ongoing/advanced HEP +/- gym program for self-management at home    Status Partially Met    Target Date 02/03/21      PT LONG TERM GOAL #2   Title Patient will demonstrate improved B LE strength to >/= 4/5 for improved stability and ease of mobility    Status Partially Met    Target Date 02/03/21      PT LONG TERM GOAL #3   Title Patient will decrease 5x STS time to </= 11.4 sec per age-appropriate norms    Baseline 23.09 sec    Status On-going   11/25/20 - reduced to 15.9 sec; 01/13/21 - reduced to 13.44 sec   Target Date 02/03/21      PT LONG TERM GOAL #4   Title Patient will improve FGA score to >/= 25/30 to improve gait stability and reduce risk for falls    Baseline 18/30    Status Achieved   01/13/21 - FGA = 26/30     PT LONG TERM GOAL #5   Title Patient will report no further instances of falls    Status Partially Met   01/13/21 - no recent falls reported     PT LONG TERM GOAL #6   Title Improve posture and alignment with patient to demonstrate improved upright posture with posterior shoulder girdle engaged    Status Partially Met      PT LONG TERM GOAL #7   Title Decrease neck, shoulder and R knee pain by >/= 50% allowing patient increased ease of daily household tasks    Status On-going      PT LONG TERM GOAL #8   Title Patient to improve cervical and B shoulder AROM to WFL/WNL without pain provocation    Status On-going      PT LONG TERM GOAL  #9   TITLE Patient to report ability perform ADLs and household tasks without neck or knee pain interference    Status On-going                   Plan - 01/22/21 1448     Clinical Impression  Statement Pam  arrived late due confusing her appointment time therefore treatment time limited today. She reported increasing incidence of HS cramping both at night and during the day, therefore reviewed factors likely to contribute to increased muscle cramps such as hydration, diet and meds, and reviewed relevant stretching to help mitigate muscle tension/tightness contributing to cramps. Remainder of session focused on education for proper posture and body mechanics to reduce neck, shoulder and back strain with typical daily tasks - handout provided for reference at home with pt acknowledging good understanding.    Comorbidities CAD s/p NSTEMI 10/30/16 & CABG x 4 01/10/18; HTN; cervicalgia; carpal tunnel syndrome s/p CTR 02/12/16; GERD; bronchitis    Rehab Potential Good    PT Frequency 2x / week    PT Duration 6 weeks    PT Treatment/Interventions ADLs/Self Care Home Management;Cryotherapy;Electrical Stimulation;Iontophoresis 21m/ml Dexamethasone;Moist Heat;Ultrasound;Gait training;Stair training;Functional mobility training;Therapeutic activities;Therapeutic exercise;Balance training;Neuromuscular re-education;Patient/family education;Manual techniques;Scar mobilization;Passive range of motion;Dry needling;Energy conservation;Taping;Vasopneumatic Device    PT Next Visit Plan progress core & proximal UE/LE strengthening as tolerated; progress balance activities; review posture & body mechanics education PRN    PT Home Exercise Plan Access Code: [email or web address](6/7), [email or web address](6/23), [email or web address](7/12), [email or web address] (7/14, updated 7/26 & 8/22)    Consulted and Agree with Plan of Care Patient             Patient will benefit from skilled therapeutic intervention in order to improve the following deficits and impairments:  Cardiopulmonary status limiting activity, Decreased activity tolerance, Decreased mobility, Decreased range of motion, Decreased scar mobility, Decreased strength, Hypomobility, Increased fascial restricitons,  Increased muscle spasms, Impaired perceived functional ability, Impaired flexibility, Impaired UE functional use, Improper body mechanics, Postural dysfunction, Pain, Decreased balance, Decreased endurance, Difficulty walking, Decreased safety awareness  Visit Diagnosis: Repeated falls  Unsteadiness on feet  Muscle weakness (generalized)  Other symptoms and signs involving the musculoskeletal system  Cervicalgia  Abnormal posture  Acute pain of right knee     Problem List Patient Active Problem List   Diagnosis Date Noted   Acute CVA (cerebrovascular accident) ([facility] 11/11/2020   Normocytic anemia 10/15/2019   Dysphagia 09/17/2019   Costochondritis 06/07/2019   Seasonal allergic rhinitis due to pollen 11/03/2018   Arthropathy of cervical facet joint 11/01/2018   Neural foraminal stenosis of cervical spine 11/01/2018   Atypical chest pain 10/24/2018   Adjustment disorder with mixed anxiety and depressed mood 09/05/2018   Insomnia 03/03/2018   S/P CABG x 4 01/10/2018   Hyperlipidemia 01/09/2018   Unstable angina ([facility]) 01/05/2018   CAD (coronary artery disease), native coronary artery 12/28/2017   Asymptomatic microscopic hematuria 11/14/2017   Iron deficiency anemia 09/05/2017   Cervicalgia 12/18/2015   Benign essential HTN 04/08/2015   Metatarsalgia of right foot 02/11/2015    JPercival Spanish PT, MPT 01/22/2021, 4:16 PM  [facility] [address] [place] [address]: [phone]  Fax:  [phone] Name: Diana MARADIAGAMRN: [id number] of Birth: [birth year]

## 2021-01-22 NOTE — Patient Instructions (Signed)

## 2021-01-23 ENCOUNTER — Encounter: Payer: Medicare Other | Admitting: Physical Therapy

## 2021-01-27 ENCOUNTER — Ambulatory Visit: Payer: Medicare Other | Admitting: Physical Therapy

## 2021-01-27 ENCOUNTER — Other Ambulatory Visit: Payer: Self-pay

## 2021-01-27 ENCOUNTER — Encounter: Payer: Self-pay | Admitting: Physical Therapy

## 2021-01-27 VITALS — BP 138/72 | HR 77

## 2021-01-27 DIAGNOSIS — M5441 Lumbago with sciatica, right side: Secondary | ICD-10-CM

## 2021-01-27 DIAGNOSIS — M25561 Pain in right knee: Secondary | ICD-10-CM

## 2021-01-27 DIAGNOSIS — R296 Repeated falls: Secondary | ICD-10-CM | POA: Diagnosis not present

## 2021-01-27 DIAGNOSIS — R2681 Unsteadiness on feet: Secondary | ICD-10-CM

## 2021-01-27 DIAGNOSIS — R29898 Other symptoms and signs involving the musculoskeletal system: Secondary | ICD-10-CM

## 2021-01-27 DIAGNOSIS — M6281 Muscle weakness (generalized): Secondary | ICD-10-CM

## 2021-01-27 DIAGNOSIS — G8929 Other chronic pain: Secondary | ICD-10-CM

## 2021-01-27 DIAGNOSIS — R293 Abnormal posture: Secondary | ICD-10-CM

## 2021-01-27 DIAGNOSIS — M542 Cervicalgia: Secondary | ICD-10-CM

## 2021-01-27 NOTE — Therapy (Signed)
[facility] [address]  [place], [place], [address] Phone: [phone]   Fax:  [phone]  Physical Therapy Treatment  Patient Details  Name: Diana [name] MRN: [id number] Date of Birth: [birth year] Referring Provider (PT): Harrison Mons, [place] & [place] Nakayama, MD   Encounter Date: 01/27/2021   PT End of Session - 01/27/21 1532     Visit Number 17    Number of Visits 25    Date for PT Re-Evaluation 02/03/21    Authorization Type UHC Medicare & Medicaid [place]    Progress Note Due on Visit 19   Re-Eval/Recert completed on visit #9 - 12/09/20   PT Start Time 1532    PT Stop Time 1634    PT Time Calculation (min) 62 min    Activity Tolerance Patient tolerated treatment well    Behavior During Therapy [facility] for tasks assessed/performed             Past Medical History:  Diagnosis Date   Allergy    Arthritis    feet   Bronchitis 10/30/2016   Carpal tunnel syndrome    Coronary artery disease    GERD (gastroesophageal reflux disease)    Hyperlipidemia    Hypertension    Non-ST elevation (NSTEMI) myocardial infarction ([place]) 10/30/2016   NSTEMI (non-ST elevated myocardial infarction) ([place]) 10/30/2016    Past Surgical History:  Procedure Laterality Date   CARPAL TUNNEL RELEASE Left 02/12/2016   Procedure: LEFT CARPAL TUNNEL RELEASE;  Surgeon: Daryll Brod, MD;  Location: [place];  Service: Orthopedics;  Laterality: Left;  FAB   CORONARY ARTERY BYPASS GRAFT N/A 01/10/2018   Procedure: CORONARY ARTERY BYPASS GRAFTING (CABG) TIMES 4, USING LEFT INTERNAL MAMMARY ARTERY TO OM1  AND ENDOSCOPICALLY HARVESTED RIGHT SAPHENOUS VEIN TO DIAGONAL, AND SEQUENTIALLY TO OM2 AND DISTAL CIRCUMFLEX;  Surgeon: Grace Isaac, MD;  Location: [facility];  Service: Open Heart Surgery;  Laterality: N/A;   LEFT HEART CATH AND CORONARY ANGIOGRAPHY N/A 01/05/2018   Procedure: LEFT HEART CATH AND CORONARY ANGIOGRAPHY;  Surgeon: Troy Sine, MD;  Location: [place] [facility];  Service: Cardiovascular;  Laterality: N/A;   TEE WITHOUT CARDIOVERSION N/A 01/10/2018   Procedure: TRANSESOPHAGEAL ECHOCARDIOGRAM (TEE);  Surgeon: Grace Isaac, MD;  Location: [place];  Service: Open Heart Surgery;  Laterality: N/A;   TONSILLECTOMY     TUBAL LIGATION     WRIST SURGERY Right     Vitals:   01/27/21 1536  BP: 138/72  Pulse: 77  SpO2: 96%     Subjective Assessment - 01/27/21 1536     Subjective Pt reports increased pain and soreness from being busy helping her mother move things around for her brother to move in. R shoulder/arm sore from pneumonia vaccine yesterday.    Pertinent History CABG x 4 - 01/10/18    Diagnostic tests 11/10/20 - cervical MRI: 1. No acute abnormality of the cervical spine.  2. Mild spinal canal stenosis at C3-C4 secondary to small disc bulge.  3. Moderate left C5-C6 neural foraminal stenosis.  4. Mild bilateral C4-C5 neural foraminal stenosis.    Patient Stated Goals "to reduce the muscle tightness and feel steadier on my feet"    Currently in Pain? Yes    Pain Score 8     Pain Location Neck   & R arm   Pain Orientation Mid;Right;Left    Pain Descriptors / Indicators Pressure;Sore    Pain Type Chronic  pain    Pain Frequency Constant    Pain Score 4    Pain Location Back    Pain Orientation Right    Pain Descriptors / Indicators Aching;Pressure    Pain Type Chronic pain    Pain Frequency Intermittent                               [facility] Adult PT Treatment/Exercise - 01/27/21 1532       Neck Exercises: Machines for Strengthening   UBE (Upper Arm Bike) L2.0 x 6 min (3' fwd/3' back)      Lumbar Exercises: Stretches   Single Knee to Chest Stretch Right;Left;1 rep;30 seconds    Single Knee to Chest Stretch Limitations hooklying    Double Knee to Chest Stretch 3 reps;10 seconds    Double Knee to Chest Stretch Limitations feet on orange Pball - gentle overpressure from PT     Lower Trunk Rotation 5 reps;10 seconds    Piriformis Stretch Right;Left;1 rep;30 seconds    Piriformis Stretch Limitations hooklying KTOS    Figure 4 Stretch 1 rep;30 seconds;Supine;With overpressure      Modalities   Modalities Moist Heat      Moist Heat Therapy   Number Minutes Moist Heat 15 Minutes    Moist Heat Location Cervical;Shoulder;Lumbar Spine   Thoracic spine     Manual Therapy   Manual Therapy Soft tissue mobilization;Myofascial release;Passive ROM;Manual Traction    Soft tissue mobilization STM to B cervical paraspinals, scalenes, UT/LS and pecs  (L>R); STM/DTM to R glutes & piriformis    Myofascial Release manual TPR to L>R UT, LS and scalenes; L medial glutes & piriformis    Passive ROM gentle B manual UT & LS stretches, gentle cervical ROM in all planes    Manual Traction gentle cervical distaction with prolonged holds                      PT Short Term Goals - 11/18/20 1536       PT SHORT TERM GOAL #1   Title Patient will be independent with initial HEP    Status Achieved   11/18/20              PT Long Term Goals - 01/13/21 1537       PT LONG TERM GOAL #1   Title Patient will be independent with ongoing/advanced HEP +/- gym program for self-management at home    Status Partially Met    Target Date 02/03/21      PT LONG TERM GOAL #2   Title Patient will demonstrate improved B LE strength to >/= 4/5 for improved stability and ease of mobility    Status Partially Met    Target Date 02/03/21      PT LONG TERM GOAL #3   Title Patient will decrease 5x STS time to </= 11.4 sec per age-appropriate norms    Baseline 23.09 sec    Status On-going   11/25/20 - reduced to 15.9 sec; 01/13/21 - reduced to 13.44 sec   Target Date 02/03/21      PT LONG TERM GOAL #4   Title Patient will improve FGA score to >/= 25/30 to improve gait stability and reduce risk for falls    Baseline 18/30    Status Achieved   01/13/21 - FGA = 26/30     PT LONG TERM  GOAL #5   Title  Patient will report no further instances of falls    Status Partially Met   01/13/21 - no recent falls reported     PT LONG TERM GOAL #6   Title Improve posture and alignment with patient to demonstrate improved upright posture with posterior shoulder girdle engaged    Status Partially Met      PT LONG TERM GOAL #7   Title Decrease neck, shoulder and R knee pain by >/= 50% allowing patient increased ease of daily household tasks    Status On-going      PT LONG TERM GOAL #8   Title Patient to improve cervical and B shoulder AROM to WFL/WNL without pain provocation    Status On-going      PT LONG TERM GOAL  #9   TITLE Patient to report ability perform ADLs and household tasks without neck or knee pain interference    Status On-going                   Plan - 01/27/21 1619     Clinical Impression Statement Pam reports increased "all over" pain today especially in neck, shoulders and back resulting from trying help her mother move things around while making room for her brother to move in along with increased R shoulder pain from a pneumonia vaccine administered yesterday. Therapy session focusing on manual STM and TPR followed by gentle stretching and ROM with pt reporting neck and shoulder pain decreased by half and LBP reduced to 3/10 by end of session. Moist heat applied at end of session to promote further relaxation and reduction in pain.    Comorbidities CAD s/p NSTEMI 10/30/16 & CABG x 4 01/10/18; HTN; cervicalgia; carpal tunnel syndrome s/p CTR 02/12/16; GERD; bronchitis    Rehab Potential Good    PT Frequency 2x / week    PT Duration 6 weeks    PT Treatment/Interventions ADLs/Self Care Home Management;Cryotherapy;Electrical Stimulation;Iontophoresis 66m/ml Dexamethasone;Moist Heat;Ultrasound;Gait training;Stair training;Functional mobility training;Therapeutic activities;Therapeutic exercise;Balance training;Neuromuscular re-education;Patient/family  education;Manual techniques;Scar mobilization;Passive range of motion;Dry needling;Energy conservation;Taping;Vasopneumatic Device    PT Next Visit Plan progress core & proximal UE/LE strengthening as tolerated; progress balance activities; review posture & body mechanics education PRN    PT Home Exercise Plan Access Code: [email or web address](6/7), [email or web address](6/23), [email or web address](7/12), [email or web address] (7/14, updated 7/26 & 8/22)    Consulted and Agree with Plan of Care Patient             Patient will benefit from skilled therapeutic intervention in order to improve the following deficits and impairments:  Cardiopulmonary status limiting activity, Decreased activity tolerance, Decreased mobility, Decreased range of motion, Decreased scar mobility, Decreased strength, Hypomobility, Increased fascial restricitons, Increased muscle spasms, Impaired perceived functional ability, Impaired flexibility, Impaired UE functional use, Improper body mechanics, Postural dysfunction, Pain, Decreased balance, Decreased endurance, Difficulty walking, Decreased safety awareness  Visit Diagnosis: Repeated falls  Unsteadiness on feet  Muscle weakness (generalized)  Other symptoms and signs involving the musculoskeletal system  Cervicalgia  Abnormal posture  Acute pain of right knee  Chronic bilateral low back pain with bilateral sciatica     Problem List Patient Active Problem List   Diagnosis Date Noted   Acute CVA (ce[place]brovascular accident) ([place] 11/11/2020   Normocytic anemia 10/15/2019   Dysphagia 09/17/2019   Costochondritis 06/07/2019   Seasonal allergic rhinitis due to pollen 11/03/2018   Arthropathy of cervical facet joint 11/01/2018   Neural foraminal stenosis of cervical spine 11/01/2018   Atypical chest pain 10/24/2018  Adjustment disorder with mixed anxiety and depressed mood 09/05/2018   Insomnia 03/03/2018   S/P CABG x 4 01/10/2018   Hyperlipidemia 01/09/2018   Unstable angina ([place]) 01/05/2018    CAD (coronary artery disease), native coronary artery 12/28/2017   Asymptomatic microscopic hematuria 11/14/2017   Iron deficiency anemia 09/05/2017   Cervicalgia 12/18/2015   Benign essential HTN 04/08/2015   Metatarsalgia of right foot 02/11/2015    Percival Spanish, PT, MPT 01/27/2021, 8:44 PM  [facility] [address]  [place], [place], [address] Phone: [phone]   Fax:  [phone]  Name: CYNTHYA YAM MRN: [id number] Date of Birth: 27-Dec-1955

## 2021-01-29 ENCOUNTER — Encounter: Payer: Self-pay | Admitting: Physical Therapy

## 2021-01-29 ENCOUNTER — Ambulatory Visit: Payer: Medicare Other | Attending: Physician Assistant | Admitting: Physical Therapy

## 2021-01-29 ENCOUNTER — Other Ambulatory Visit: Payer: Self-pay

## 2021-01-29 DIAGNOSIS — R2681 Unsteadiness on feet: Secondary | ICD-10-CM | POA: Diagnosis present

## 2021-01-29 DIAGNOSIS — R296 Repeated falls: Secondary | ICD-10-CM | POA: Insufficient documentation

## 2021-01-29 DIAGNOSIS — M25561 Pain in right knee: Secondary | ICD-10-CM | POA: Diagnosis present

## 2021-01-29 DIAGNOSIS — M5442 Lumbago with sciatica, left side: Secondary | ICD-10-CM | POA: Insufficient documentation

## 2021-01-29 DIAGNOSIS — M542 Cervicalgia: Secondary | ICD-10-CM | POA: Diagnosis present

## 2021-01-29 DIAGNOSIS — R29898 Other symptoms and signs involving the musculoskeletal system: Secondary | ICD-10-CM | POA: Diagnosis present

## 2021-01-29 DIAGNOSIS — G8929 Other chronic pain: Secondary | ICD-10-CM

## 2021-01-29 DIAGNOSIS — M6281 Muscle weakness (generalized): Secondary | ICD-10-CM | POA: Diagnosis present

## 2021-01-29 DIAGNOSIS — R293 Abnormal posture: Secondary | ICD-10-CM | POA: Diagnosis present

## 2021-01-29 DIAGNOSIS — M5441 Lumbago with sciatica, right side: Secondary | ICD-10-CM | POA: Diagnosis present

## 2021-01-29 NOTE — Therapy (Signed)
[facility] [address]  [place], [place], [address] Phone: [phone]   Fax:  [phone]  Physical Therapy Treatment  Patient Details  Name: Diana [name] MRN: [id number] Date of Birth: 04/29/1956 Referring Provider (PT): Harrison Mons, [place] & Melrose Nakayama, MD   Encounter Date: 01/29/2021   PT End of Session - 01/29/21 1533     Visit Number 18    Number of Visits 25    Date for PT Re-Evaluation 02/03/21    Authorization Type UHC Medicare & Medicaid [place]    Progress Note Due on Visit 19   Re-Eval/Recert completed on visit #9 - 12/09/20   PT Start Time 1533    PT Stop Time 1635    PT Time Calculation (min) 62 min    Activity Tolerance Patient tolerated treatment well    Behavior During Therapy [facility] for tasks assessed/performed             Past Medical History:  Diagnosis Date   Allergy    Arthritis    feet   Bronchitis 10/30/2016   Carpal tunnel syndrome    Coronary artery disease    GERD (gastroesophageal reflux disease)    Hyperlipidemia    Hypertension    Non-ST elevation (NSTEMI) myocardial infarction ([place]) 10/30/2016   NSTEMI (non-ST elevated myocardial infarction) ([place]) 10/30/2016    Past Surgical History:  Procedure Laterality Date   CARPAL TUNNEL RELEASE Left 02/12/2016   Procedure: LEFT CARPAL TUNNEL RELEASE;  Surgeon: Daryll Brod, MD;  Location: [place];  Service: Orthopedics;  Laterality: Left;  FAB   CORONARY ARTERY BYPASS GRAFT N/A 01/10/2018   Procedure: CORONARY ARTERY BYPASS GRAFTING (CABG) TIMES 4, USING LEFT INTERNAL MAMMARY ARTERY TO OM1  AND ENDOSCOPICALLY HARVESTED RIGHT SAPHENOUS VEIN TO DIAGONAL, AND SEQUENTIALLY TO OM2 AND DISTAL CIRCUMFLEX;  Surgeon: Grace Isaac, MD;  Location: [place];  Service: Open Heart Surgery;  Laterality: N/A;   LEFT HEART CATH AND CORONARY ANGIOGRAPHY N/A 01/05/2018   Procedure: LEFT HEART CATH AND CORONARY ANGIOGRAPHY;  Surgeon: Troy Sine, MD;  Location: [place] CV LAB;  Service: Cardiovascular;  Laterality: N/A;   TEE WITHOUT CARDIOVERSION N/A 01/10/2018   Procedure: TRANSESOPHAGEAL ECHOCARDIOGRAM (TEE);  Surgeon: Grace Isaac, MD;  Location: [place];  Service: Open Heart Surgery;  Laterality: N/A;   TONSILLECTOMY     TUBAL LIGATION     WRIST SURGERY Right     There were no vitals filed for this visit.   Subjective Assessment - 01/29/21 1536     Pertinent History CABG x 4 - 01/10/18    Diagnostic tests 11/10/20 - cervical MRI: 1. No acute abnormality of the cervical spine.  2. Mild spinal canal stenosis at C3-C4 secondary to small disc bulge.  3. Moderate left C5-C6 neural foraminal stenosis.  4. Mild bilateral C4-C5 neural foraminal stenosis.    Patient Stated Goals "to reduce the muscle tightness and feel steadier on my feet"    Currently in Pain? Yes    Pain Score 6     Pain Location Neck   & shoulder   Pain Orientation Right    Pain Descriptors / Indicators Aching    Pain Score 4    Pain Location Back    Pain Orientation Lower    Pain Descriptors / Indicators Aching  [place] Adult PT Treatment/Exercise - 01/29/21 1533       Neck Exercises: Machines for Strengthening   UBE (Upper Arm Bike) L2.0 x 6 min (3' fwd/3' back)      Neck Exercises: Stretches   Upper Trapezius Stretch Right;Left;2 reps;30 seconds    Upper Trapezius Stretch Limitations strap over shoulder to assist with shoulder depression    Levator Stretch Right;Left;2 reps;30 seconds    Levator Stretch Limitations strap over shoulder to assist with shoulder depression    Other Neck Stretches R/L scalene stretch with strap over shoulder 2 x 30 sec      Shoulder Exercises: Supine   Horizontal ABduction Both;10 reps;Strengthening;Theraband    Theraband Level (Shoulder Horizontal ABduction) Level 1 (Yellow)    Horizontal ABduction Limitations cues for TrA activation & scap retraction       Shoulder Exercises: Standing   Extension Both;10 reps;Strengthening;Theraband    Theraband Level (Shoulder Extension) Level 1 (Yellow)    Extension Limitations cues for TrA activation & scap retraction    Row Both;10 reps;Strengthening;Theraband    Theraband Level (Shoulder Row) Level 1 (Yellow)    Row Limitations cues for TrA activation & scap retraction      Manual Therapy   Manual Therapy Taping      Kinesiotix   Inhibit Muscle  B UT & LS - 30% stretch; B lumbosacral paraspinals - 30-50% stretch   navy RockTape                     PT Short Term Goals - 11/18/20 1536       PT SHORT TERM GOAL #1   Title Patient will be independent with initial HEP    Status Achieved   11/18/20              PT Long Term Goals - 01/13/21 1537       PT LONG TERM GOAL #1   Title Patient will be independent with ongoing/advanced HEP +/- gym program for self-management at home    Status Partially Met    Target Date 02/03/21      PT LONG TERM GOAL #2   Title Patient will demonstrate improved B LE strength to >/= 4/5 for improved stability and ease of mobility    Status Partially Met    Target Date 02/03/21      PT LONG TERM GOAL #3   Title Patient will decrease 5x STS time to </= 11.4 sec per age-appropriate norms    Baseline 23.09 sec    Status On-going   11/25/20 - reduced to 15.9 sec; 01/13/21 - reduced to 13.44 sec   Target Date 02/03/21      PT LONG TERM GOAL #4   Title Patient will improve FGA score to >/= 25/30 to improve gait stability and reduce risk for falls    Baseline 18/30    Status Achieved   01/13/21 - FGA = 26/30     PT LONG TERM GOAL #5   Title Patient will report no further instances of falls    Status Partially Met   01/13/21 - no recent falls reported     PT LONG TERM GOAL #6   Title Improve posture and alignment with patient to demonstrate improved upright posture with posterior shoulder girdle engaged    Status Partially Met      PT LONG TERM  GOAL #7   Title Decrease neck, shoulder and R knee pain by >/= 50% allowing patient increased ease  of daily household tasks    Status On-going      PT LONG TERM GOAL #8   Title Patient to improve cervical and B shoulder AROM to WFL/WNL without pain provocation    Status On-going      PT LONG TERM GOAL  #9   TITLE Patient to report ability perform ADLs and household tasks without neck or knee pain interference    Status On-going                   Plan - 01/29/21 1630     Clinical Impression Statement Pam reports her pain is a little better than last visit but still elevated - she requested kinesiotape application to see if that will help the muscle relax as it has been beneficial for her in the past, therefore tape applied for B UT & LS as well as trial of taping for LBP along B lumbosacral paraspinals. Reviewed relevant stretches along with postural and core strengthening to promote muscle engagement for improved postural support - pt admitting to only focusing on stretches at home, therefore encouraged balance of stretching and strengthening with HEP. Session concluded with moist heat application to promote further muscle relaxation. Given recent pain exacerbation, ongoing weakness and decreased activity tolerance, anticipate pt will benefit from recert on goal assessment next visit.    Comorbidities CAD s/p NSTEMI 10/30/16 & CABG x 4 01/10/18; HTN; cervicalgia; carpal tunnel syndrome s/p CTR 02/12/16; GERD; bronchitis    Rehab Potential Good    PT Frequency 2x / week    PT Duration 6 weeks    PT Treatment/Interventions ADLs/Self Care Home Management;Cryotherapy;Electrical Stimulation;Iontophoresis 45m/ml Dexamethasone;Moist Heat;Ultrasound;Gait training;Stair training;Functional mobility training;Therapeutic activities;Therapeutic exercise;Balance training;Neuromuscular re-education;Patient/family education;Manual techniques;Scar mobilization;Passive range of motion;Dry needling;Energy  conservation;Taping;Vasopneumatic Device    PT Next Visit Plan Recert; progress core & proximal UE/LE strengthening as tolerated; progress balance activities; review posture & body mechanics education PRN    PT Home Exercise Plan Access Code: [email or web address](6/7), [email or web address](6/23), [email or web address](7/12), [email or web address] (7/14, updated 7/26 & 8/22)    Consulted and Agree with Plan of Care Patient             Patient will benefit from skilled therapeutic intervention in order to improve the following deficits and impairments:  Cardiopulmonary status limiting activity, Decreased activity tolerance, Decreased mobility, Decreased range of motion, Decreased scar mobility, Decreased strength, Hypomobility, Increased fascial restricitons, Increased muscle spasms, Impaired perceived functional ability, Impaired flexibility, Impaired UE functional use, Improper body mechanics, Postural dysfunction, Pain, Decreased balance, Decreased endurance, Difficulty walking, Decreased safety awareness  Visit Diagnosis: Repeated falls  Unsteadiness on feet  Muscle weakness (generalized)  Other symptoms and signs involving the musculoskeletal system  Cervicalgia  Abnormal posture  Acute pain of right knee  Chronic bilateral low back pain with bilateral sciatica     Problem List Patient Active Problem List   Diagnosis Date Noted   Acute CV[place] ([place]bro[place] accident) [place] 11/11/2020   Normocytic anemia 10/15/2019   Dysphagia 09/17/2019   Costochondritis 06/07/2019   Seasonal allergic rhinitis due to pollen 11/03/2018   Arthropathy of cervical facet joint 11/01/2018   Neural foraminal stenosis of cervical spine 11/01/2018   Atypical chest pain 10/24/2018   Adjustment disorder with mixed anxiety and depressed mood 09/05/2018   Insomnia 03/03/2018   S/P CABG x 4 01/10/2018   Hyperlipidemia 01/09/2018   Unstable angina (HCC) 01/05/2018   CAD (coronary artery disease), native coronary artery 12/28/2017    Asymptomatic microscopic hematuria 11/14/2017  Iron deficiency anemia 09/05/2017   Cervicalgia 12/18/2015   Benign essential HTN 04/08/2015   Metatarsalgia of right foot 02/11/2015    Percival Spanish, PT, MPT 01/29/2021, 4:32 PM  [facility] [address]  [place], [place], [address] Phone: [phone]   Fax:  [phone]  Name: JUDINE ARCINIEGA MRN: [id number] Date of Birth: 1956-04-25

## 2021-01-30 ENCOUNTER — Encounter: Payer: Medicare Other | Admitting: Physical Therapy

## 2021-02-03 ENCOUNTER — Other Ambulatory Visit: Payer: Self-pay

## 2021-02-03 ENCOUNTER — Ambulatory Visit: Payer: Medicare Other | Admitting: Physical Therapy

## 2021-02-03 ENCOUNTER — Encounter: Payer: Self-pay | Admitting: Physical Therapy

## 2021-02-03 DIAGNOSIS — R296 Repeated falls: Secondary | ICD-10-CM | POA: Diagnosis not present

## 2021-02-03 DIAGNOSIS — R29898 Other symptoms and signs involving the musculoskeletal system: Secondary | ICD-10-CM

## 2021-02-03 DIAGNOSIS — M542 Cervicalgia: Secondary | ICD-10-CM

## 2021-02-03 DIAGNOSIS — R293 Abnormal posture: Secondary | ICD-10-CM

## 2021-02-03 DIAGNOSIS — M6281 Muscle weakness (generalized): Secondary | ICD-10-CM

## 2021-02-03 DIAGNOSIS — R2681 Unsteadiness on feet: Secondary | ICD-10-CM

## 2021-02-03 NOTE — Therapy (Addendum)
[facility] [address]  [place], [place], [address] Phone: [phone]   Fax:  [phone]  Physical Therapy Treatment / Recert  Patient Details  Name: Diana [name] MRN: [id number] Date of Birth: September 08, 1955 Referring Provider (PT): Harrison Mons, PA-C (PCP) & Melrose Nakayama, MD  Progress Note  Reporting Period 12/09/2020 to 02/03/2021  See note below for Objective Data and Assessment of Progress/Goals.     Encounter Date: 02/03/2021   PT End of Session - 02/03/21 1535     Visit Number 19    Number of Visits 31    Date for PT Re-Evaluation 03/17/21    Authorization Type UHC Medicare & Medicaid [place]    Progress Note Due on Visit 29   Recert completed on visit #19 - 02/03/21   PT Start Time 29   Pt arrived late   PT Stop Time 1622    PT Time Calculation (min) 47 min    Activity Tolerance Patient tolerated treatment well    Behavior During Therapy [facility] for tasks assessed/performed             Past Medical History:  Diagnosis Date   Allergy    Arthritis    feet   Bronchitis 10/30/2016   Carpal tunnel syndrome    Coronary artery disease    GERD (gastroesophageal reflux disease)    Hyperlipidemia    Hypertension    Non-ST elevation (NSTEMI) myocardial infarction ([place]) 10/30/2016   NSTEMI (non-ST elevated myocardial infarction) ([place]) 10/30/2016    Past Surgical History:  Procedure Laterality Date   CARPAL TUNNEL RELEASE Left 02/12/2016   Procedure: LEFT CARPAL TUNNEL RELEASE;  Surgeon: Daryll Brod, MD;  Location: [place];  Service: Orthopedics;  Laterality: Left;  FAB   CORONARY ARTERY BYPASS GRAFT N/A 01/10/2018   Procedure: CORONARY ARTERY BYPASS GRAFTING (CABG) TIMES 4, USING LEFT INTERNAL MAMMARY ARTERY TO OM1  AND ENDOSCOPICALLY HARVESTED RIGHT SAPHENOUS VEIN TO DIAGONAL, AND SEQUENTIALLY TO OM2 AND DISTAL CIRCUMFLEX;  Surgeon: Grace Isaac, MD;  Location: [place];  Service: Open Heart  Surgery;  Laterality: N/A;   LEFT HEART CATH AND CORONARY ANGIOGRAPHY N/A 01/05/2018   Procedure: LEFT HEART CATH AND CORONARY ANGIOGRAPHY;  Surgeon: Troy Sine, MD;  Location: [place] CV LAB;  Service: Cardiovascular;  Laterality: N/A;   TEE WITHOUT CARDIOVERSION N/A 01/10/2018   Procedure: TRANSESOPHAGEAL ECHOCARDIOGRAM (TEE);  Surgeon: Grace Isaac, MD;  Location: [place];  Service: Open Heart Surgery;  Laterality: N/A;   TONSILLECTOMY     TUBAL LIGATION     WRIST SURGERY Right     There were no vitals filed for this visit.   Subjective Assessment - 02/03/21 1539     Subjective Pr reports pain is better to day but still feels more like her muscles want to cramp.    Pertinent History CABG x 4 - 01/10/18    Diagnostic tests 11/10/20 - cervical MRI: 1. No acute abnormality of the cervical spine.  2. Mild spinal canal stenosis at C3-C4 secondary to small disc bulge.  3. Moderate left C5-C6 neural foraminal stenosis.  4. Mild bilateral C4-C5 neural foraminal stenosis.    Patient Stated Goals "to reduce the muscle tightness and feel steadier on my feet"    Currently in Pain? Yes    Pain Score 4     Pain Location Neck    Pain Orientation Right    Pain Descriptors / Indicators Aching  Pain Score 4    Pain Location Leg    Pain Orientation Right;Left;Upper;Posterior    Pain Descriptors / Indicators Cramping                [facility] PT Assessment - 02/03/21 1535       Assessment   Medical Diagnosis Cervical spondylosis; R knee chondromalacia patella    Referring Provider (PT) Harrison Mons, PA-C (PCP) & Melrose Nakayama, MD    Next MD Visit 04/28/21   with PCP     Prior Function   Level of Independence Independent    Vocation Unemployed;On disability   since open heart surgery   Leisure walking 2 days/wk alternating with aquatic pool exercise 2x/wk, online exercises on occasion      AROM   Right Shoulder Flexion 140 Degrees    Right Shoulder ABduction 144 Degrees     Right Shoulder Internal Rotation --   FIR L2   Right Shoulder External Rotation --   FER T1   Left Shoulder Flexion 138 Degrees    Left Shoulder ABduction 144 Degrees    Left Shoulder Internal Rotation --   FIR L2   Left Shoulder External Rotation --   FER T1   Cervical Flexion 58    Cervical Extension 46    Cervical - Right Side Bend 30    Cervical - Left Side Bend 18    Cervical - Right Rotation 52    Cervical - Left Rotation 45      Strength   Right Shoulder Flexion 4/5    Right Shoulder ABduction 4/5    Right Shoulder Internal Rotation 4/5    Right Shoulder External Rotation 4-/5    Left Shoulder Flexion 4/5    Left Shoulder ABduction 4-/5    Left Shoulder Internal Rotation 4/5    Left Shoulder External Rotation 4-/5    Right Hip Flexion 4/5    Right Hip Extension 3-/5    Right Hip External Rotation  4/5    Right Hip Internal Rotation 4/5    Right Hip ABduction 4-/5    Right Hip ADduction 3+/5    Left Hip Flexion 4-/5    Left Hip Extension 3-/5    Left Hip External Rotation 3+/5    Left Hip Internal Rotation 3+/5    Left Hip ABduction 3/5    Left Hip ADduction 3-/5    Right Knee Flexion 4+/5    Right Knee Extension 4+/5    Left Knee Flexion 4/5    Left Knee Extension 4+/5    Right Ankle Dorsiflexion 4-/5    Left Ankle Dorsiflexion 4/5      Standardized Balance Assessment   Five times sit to stand comments  11.37 sec   norm for ages 58-69 = 11.04 sec                          [facility] Adult PT Treatment/Exercise - 02/03/21 1535       Knee/Hip Exercises: Stretches   Passive Hamstring Stretch Right;Left;2 reps;30 seconds    Passive Hamstring Stretch Limitations seated hip hinge +/- strap      Knee/Hip Exercises: Aerobic   Nustep L5 x 6 min (UE/LE)                    PT Short Term Goals - 11/18/20 1536       PT SHORT TERM GOAL #1   Title Patient will be independent with  initial HEP    Status Achieved   11/18/20               PT Long Term Goals - 02/03/21 1545       PT LONG TERM GOAL #1   Title Patient will be independent with ongoing/advanced HEP +/- gym program for self-management at home    Status Partially Met    Target Date 03/17/21      PT LONG TERM GOAL #2   Title Patient will demonstrate improved B LE strength to >/= 4/5 for improved stability and ease of mobility    Status Partially Met    Target Date 03/17/21      PT LONG TERM GOAL #3   Title Patient will decrease 5x STS time to </= 11.4 sec per age-appropriate norms    Baseline 23.09 sec    Status Achieved   11/25/20 - reduced to 15.9 sec; 01/13/21 - reduced to 13.44 sec; 02/03/21 - reduced to 11.37 sec - goal met     PT LONG TERM GOAL #4   Title Patient will improve FGA score to >/= 25/30 to improve gait stability and reduce risk for falls    Baseline 18/30    Status Achieved   01/13/21 - FGA = 26/30     PT LONG TERM GOAL #5   Title Patient will report no further instances of falls    Status Achieved   02/03/21 - no recent falls reported     PT LONG TERM GOAL #6   Title Improve posture and alignment with patient to demonstrate improved upright posture with posterior shoulder girdle engaged    Status Partially Met    Target Date 03/17/21      PT LONG TERM GOAL #7   Title Decrease neck, shoulder and R knee pain by >/= 50% allowing patient increased ease of daily household tasks    Status Partially Met   02/03/21 - 98% improvement in R knee pain; 65% improvement in neck and shoulder pain but still feels that pain prevents her from doing daily chores/household tasks   Target Date 03/17/21      PT LONG TERM GOAL #8   Title Patient to improve cervical and B shoulder AROM to WFL/WNL without pain provocation    Status Partially Met    Target Date 03/17/21      PT LONG TERM GOAL  #9   TITLE Patient to report ability perform ADLs and household tasks without neck or knee pain interference    Status On-going    Target Date 03/17/21                    Plan - 02/03/21 1616     Clinical Impression Statement Pam reports 98% reduction in R knee pain and 65% improvement in neck and shoulder pain but states the neck and shoulder pain still limit her with household tasks such as lifting and cleaning. Her neck and B shoulder ROM has improved in all planes but not quite back to WFL/WNL. Her overall B UE and LE continues to improve although her B hip strength remains significantly weaker resulting in decreased proximal and core stability as well as continued limited activity tolerance - she admits to focusing more on stretching and less strengthening with HEP of late, therefore encouraged a balance between both stretching and strengthening. She has met all of her balance related LTGs with most recent FGA score = 26/30 and 5x STS now only 11.37 sec.  Her remaining LTGs are partially met or ongoing with continued pain, abnormal muscle tension and weakness being her greatest deficits. She would like to continue PT to work on these deficits while awaiting a new othro referral for her neck and shoulder pain from her PCP (she has not been scheduled for an appointment and is not sure what MD she will be seeing). As such will recommend recert for continued PT 1-2x/wk x 4-6 weeks.    Personal Factors and Comorbidities Comorbidity 3+;Time since onset of injury/illness/exacerbation;Past/Current Experience;Fitness    Comorbidities CAD s/p NSTEMI 10/30/16 & CABG x 4 01/10/18; HTN; cervicalgia; carpal tunnel syndrome s/p CTR 02/12/16; GERD; bronchitis    Examination-Activity Limitations Locomotion Level;Stairs;Lift;Carry;Reach Overhead;Caring for Others    Examination-Participation Restrictions Cleaning;Community Activity;Laundry;Meal Prep    Rehab Potential Good    PT Frequency 2x / week    PT Duration 6 weeks    PT Treatment/Interventions ADLs/Self Care Home Management;Cryotherapy;Electrical Stimulation;Iontophoresis 85m/ml Dexamethasone;Moist  Heat;Ultrasound;Gait training;Stair training;Functional mobility training;Therapeutic activities;Therapeutic exercise;Balance training;Neuromuscular re-education;Patient/family education;Manual techniques;Scar mobilization;Passive range of motion;Dry needling;Energy conservation;Taping;Vasopneumatic Device    PT Next Visit Plan progress core & proximal UE/LE strengthening as tolerated; manual therapy + potential DN for abnormal muscle tension; review posture & body mechanics education PRN; balance activities as indicated    PT Home Exercise Plan Access Code: [email or web address](6/7), [email or web address](6/23), [email or web address](7/12), [email or web address] (7/14, updated 7/26 & 8/22)    Consulted and Agree with Plan of Care Patient             Patient will benefit from skilled therapeutic intervention in order to improve the following deficits and impairments:  Cardiopulmonary status limiting activity, Decreased activity tolerance, Decreased mobility, Decreased range of motion, Decreased scar mobility, Decreased strength, Hypomobility, Increased fascial restricitons, Increased muscle spasms, Impaired perceived functional ability, Impaired flexibility, Impaired UE functional use, Improper body mechanics, Postural dysfunction, Pain, Decreased balance, Decreased endurance, Difficulty walking, Decreased safety awareness  Visit Diagnosis: Repeated falls - Plan: PT plan of care cert/re-cert  Unsteadiness on feet - Plan: PT plan of care cert/re-cert  Muscle weakness (generalized) - Plan: PT plan of care cert/re-cert  Other symptoms and signs involving the musculoskeletal system - Plan: PT plan of care cert/re-cert  Cervicalgia - Plan: PT plan of care cert/re-cert  Abnormal posture - Plan: PT plan of care cert/re-cert     Problem List Patient Active Problem List   Diagnosis Date Noted   Acute [place] ([place]brovascular accident) ([place] 11/11/2020   Normocytic anemia 10/15/2019   Dysphagia 09/17/2019   Costochondritis 06/07/2019    Seasonal allergic rhinitis due to pollen 11/03/2018   Arthropathy of cervical facet joint 11/01/2018   Neural foraminal stenosis of cervical spine 11/01/2018   Atypical chest pain 10/24/2018   Adjustment disorder with mixed anxiety and depressed mood 09/05/2018   Insomnia 03/03/2018   S/P CABG x 4 01/10/2018   Hyperlipidemia 01/09/2018   Unstable angina (HCC) 01/05/2018   CAD (coronary artery disease), native coronary artery 12/28/2017   Asymptomatic microscopic hematuria 11/14/2017   Iron deficiency anemia 09/05/2017   Cervicalgia 12/18/2015   Benign essential HTN 04/08/2015   Metatarsalgia of right foot 02/11/2015    JPercival Spanish PT, MPT 02/03/2021, 5:42 PM  [facility] [address] [place] [address]: [phone]  Fax:  [phone] Name: PJAYAH BALTHAZARMRN: [id number] of Birth: 816-May-1957

## 2021-02-10 ENCOUNTER — Other Ambulatory Visit: Payer: Self-pay

## 2021-02-10 ENCOUNTER — Ambulatory Visit: Payer: Medicare Other | Admitting: Physical Therapy

## 2021-02-10 ENCOUNTER — Encounter: Payer: Self-pay | Admitting: Physical Therapy

## 2021-02-10 DIAGNOSIS — R29898 Other symptoms and signs involving the musculoskeletal system: Secondary | ICD-10-CM

## 2021-02-10 DIAGNOSIS — M542 Cervicalgia: Secondary | ICD-10-CM

## 2021-02-10 DIAGNOSIS — R2681 Unsteadiness on feet: Secondary | ICD-10-CM

## 2021-02-10 DIAGNOSIS — R293 Abnormal posture: Secondary | ICD-10-CM

## 2021-02-10 DIAGNOSIS — M6281 Muscle weakness (generalized): Secondary | ICD-10-CM

## 2021-02-10 DIAGNOSIS — R296 Repeated falls: Secondary | ICD-10-CM | POA: Diagnosis not present

## 2021-02-10 NOTE — Therapy (Signed)
[facility] [address]  [place], [place], [address] Phone: [phone]   Fax:  [phone]  Physical Therapy Treatment  Patient Details  Name: Diana [name] MRN: [id number] Date of Birth: 1955-12-07 Referring Provider (PT): Harrison Mons, PA-C (PCP) & Melrose Nakayama, MD   Encounter Date: 02/10/2021   PT End of Session - 02/10/21 1537     Visit Number 20    Number of Visits 31    Date for PT Re-Evaluation 03/17/21    Authorization Type UHC Medicare & Medicaid [place]    Progress Note Due on Visit 67   Recert completed on visit #19 - 02/03/21   PT Start Time 1537    PT Stop Time 1636    PT Time Calculation (min) 59 min    Activity Tolerance Patient tolerated treatment well    Behavior During Therapy [facility] for tasks assessed/performed             Past Medical History:  Diagnosis Date   Allergy    Arthritis    feet   Bronchitis 10/30/2016   Carpal tunnel syndrome    Coronary artery disease    GERD (gastroesophageal reflux disease)    Hyperlipidemia    Hypertension    Non-ST elevation (NSTEMI) myocardial infarction ([place]) 10/30/2016   NSTEMI (non-ST elevated myocardial infarction) ([place]) 10/30/2016    Past Surgical History:  Procedure Laterality Date   CARPAL TUNNEL RELEASE Left 02/12/2016   Procedure: LEFT CARPAL TUNNEL RELEASE;  Surgeon: Daryll Brod, MD;  Location: [place];  Service: Orthopedics;  Laterality: Left;  FAB   CORONARY ARTERY BYPASS GRAFT N/A 01/10/2018   Procedure: CORONARY ARTERY BYPASS GRAFTING (CABG) TIMES 4, USING LEFT INTERNAL MAMMARY ARTERY TO OM1  AND ENDOSCOPICALLY HARVESTED RIGHT SAPHENOUS VEIN TO DIAGONAL, AND SEQUENTIALLY TO OM2 AND DISTAL CIRCUMFLEX;  Surgeon: Grace Isaac, MD;  Location: [place];  Service: Open Heart Surgery;  Laterality: N/A;   LEFT HEART CATH AND CORONARY ANGIOGRAPHY N/A 01/05/2018   Procedure: LEFT HEART CATH AND CORONARY ANGIOGRAPHY;  Surgeon: Troy Sine, MD;  Location: [place] [facility];  Service: Cardiovascular;  Laterality: N/A;   TEE WITHOUT CARDIOVERSION N/A 01/10/2018   Procedure: TRANSESOPHAGEAL ECHOCARDIOGRAM (TEE);  Surgeon: Grace Isaac, MD;  Location: [place];  Service: Open Heart Surgery;  Laterality: N/A;   TONSILLECTOMY     TUBAL LIGATION     WRIST SURGERY Right     There were no vitals filed for this visit.   Subjective Assessment - 02/10/21 1546     Subjective Pt reports her pain is not too bad today - mostly in neck and shoulders, but does have a slight headache.    Pertinent History CABG x 4 - 01/10/18    Diagnostic tests 11/10/20 - cervical MRI: 1. No acute abnormality of the cervical spine.  2. Mild spinal canal stenosis at C3-C4 secondary to small disc bulge.  3. Moderate left C5-C6 neural foraminal stenosis.  4. Mild bilateral C4-C5 neural foraminal stenosis.    Patient Stated Goals "to reduce the muscle tightness and feel steadier on my feet"    Currently in Pain? Yes    Pain Score 4     Pain Location Neck   & shoulder + headache   Pain Orientation Right    Pain Descriptors / Indicators Aching    Pain Type Chronic pain    Pain Frequency Constant  [facility] PT Assessment - 02/10/21 1537       Assessment   Medical Diagnosis Deconditioning & frequent falls; Cervical spondylosis; R knee chondromalacia patella    Referring Provider (PT) Harrison Mons, PA-C (PCP) & Melrose Nakayama, MD    Next MD Visit 02/20/21 with neurosurgery; 04/28/21 with PCP                           [facility] Adult PT Treatment/Exercise - 02/10/21 1537       Neck Exercises: Seated   Neck Retraction 10 reps;5 secs    Neck Retraction Limitations isometric into ball on wall      Shoulder Exercises: Seated   Horizontal ABduction Both;10 reps;Strengthening;Theraband    Theraband Level (Shoulder Horizontal ABduction) Level 1 (Yellow)    Horizontal ABduction Limitations pool noodle along spine to  promote good posture & scap retraction    External Rotation Both;5 reps;Strengthening;Theraband   3 sets   Theraband Level (Shoulder External Rotation) Level 1 (Yellow)    External Rotation Limitations pool noodle along spine to promote good posture & scap retraction    Diagonals Both;5 reps;Strengthening;Theraband   2 sets   Theraband Level (Shoulder Diagonals) Level 1 (Yellow)    Diagonals Limitations pool noodle along spine to promote good posture & scap retraction      Shoulder Exercises: ROM/Strengthening   Wall Pushups 5 reps   3 sets   Wall Pushups Limitations push-up plus      Moist Heat Therapy   Number Minutes Moist Heat 15 Minutes    Moist Heat Location Cervical;Shoulder      Manual Therapy   Manual Therapy Soft tissue mobilization;Myofascial release;Passive ROM;Manual Traction    Soft tissue mobilization STM to B cervical paraspinals, scalenes, UT/LS and suboccipitals  (R>L)    Myofascial Release manual TPR to R>L UT    Passive ROM gentle B manual UT & LS stretches, gentle cervical ROM in all planes    Manual Traction gentle cervical distaction with prolonged holds                       PT Short Term Goals - 11/18/20 1536       PT SHORT TERM GOAL #1   Title Patient will be independent with initial HEP    Status Achieved   11/18/20              PT Long Term Goals - 02/03/21 1545       PT LONG TERM GOAL #1   Title Patient will be independent with ongoing/advanced HEP +/- gym program for self-management at home    Status Partially Met    Target Date 03/17/21      PT LONG TERM GOAL #2   Title Patient will demonstrate improved B LE strength to >/= 4/5 for improved stability and ease of mobility    Status Partially Met    Target Date 03/17/21      PT LONG TERM GOAL #3   Title Patient will decrease 5x STS time to </= 11.4 sec per age-appropriate norms    Baseline 23.09 sec    Status Achieved   11/25/20 - reduced to 15.9 sec; 01/13/21 - reduced to  13.44 sec; 02/03/21 - reduced to 11.37 sec - goal met     PT LONG TERM GOAL #4   Title Patient will improve FGA score to >/= 25/30 to improve gait stability and reduce risk for falls  Baseline 18/30    Status Achieved   01/13/21 - FGA = 26/30     PT LONG TERM GOAL #5   Title Patient will report no further instances of falls    Status Achieved   02/03/21 - no recent falls reported     PT LONG TERM GOAL #6   Title Improve posture and alignment with patient to demonstrate improved upright posture with posterior shoulder girdle engaged    Status Partially Met    Target Date 03/17/21      PT LONG TERM GOAL #7   Title Decrease neck, shoulder and R knee pain by >/= 50% allowing patient increased ease of daily household tasks    Status Partially Met   02/03/21 - 98% improvement in R knee pain; 65% improvement in neck and shoulder pain but still feels that pain prevents her from doing daily chores/household tasks   Target Date 03/17/21      PT LONG TERM GOAL #8   Title Patient to improve cervical and B shoulder AROM to WFL/WNL without pain provocation    Status Partially Met    Target Date 03/17/21      PT LONG TERM GOAL  #9   TITLE Patient to report ability perform ADLs and household tasks without neck or knee pain interference    Status On-going    Target Date 03/17/21                   Plan - 02/10/21 1552     Clinical Impression Statement Diana [name] reports R sided neck and shoulder pain less intense today but does have a R sided headache. She states she has been trying to work on more of a balance between the stretches and strengthening exercises with her HEP but tolerance for strengthening exercises during therapy session remains very limited with fatigue reported after only 5-8 reps with most exercises at the lowest level of resistance. Given limited tolerance encouraged fewer reps per set while trying to increase number of sets, allowing for rest breaks in between. Session concluded  with MT addressing R neck/shoulder pain and headache with pt reporting HA resolved following MT. Moist heat applied per pt preference to further promote muscle relaxation and pain relief.    Personal Factors and Comorbidities Comorbidity 3+;Time since onset of injury/illness/exacerbation;Past/Current Experience;Fitness    Comorbidities CAD s/p NSTEMI 10/30/16 & CABG x 4 01/10/18; HTN; cervicalgia; carpal tunnel syndrome s/p CTR 02/12/16; GERD; bronchitis    Examination-Activity Limitations Locomotion Level;Stairs;Lift;Carry;Reach Overhead;Caring for Others    Examination-Participation Restrictions Cleaning;Community Activity;Laundry;Meal Prep    Rehab Potential Good    PT Frequency 2x / week    PT Duration 6 weeks    PT Treatment/Interventions ADLs/Self Care Home Management;Cryotherapy;Electrical Stimulation;Iontophoresis 1m/ml Dexamethasone;Moist Heat;Ultrasound;Gait training;Stair training;Functional mobility training;Therapeutic activities;Therapeutic exercise;Balance training;Neuromuscular re-education;Patient/family education;Manual techniques;Scar mobilization;Passive range of motion;Dry needling;Energy conservation;Taping;Vasopneumatic Device    PT Next Visit Plan progress core & proximal UE/LE strengthening as tolerated; manual therapy + potential DN for abnormal muscle tension; review posture & body mechanics education PRN; balance activities as indicated    PT Home Exercise Plan Access Code: [email or web address](6/7), [email or web address](6/23), [email or web address](7/12), [email or web address] (7/14, updated 7/26 & 8/22)    Consulted and Agree with Plan of Care Patient             Patient will benefit from skilled therapeutic intervention in order to improve the following deficits and impairments:  Cardiopulmonary status limiting activity, Decreased activity tolerance, Decreased mobility, Decreased range of motion,  Decreased scar mobility, Decreased strength, Hypomobility, Increased fascial restricitons, Increased muscle spasms,  Impaired perceived functional ability, Impaired flexibility, Impaired UE functional use, Improper body mechanics, Postural dysfunction, Pain, Decreased balance, Decreased endurance, Difficulty walking, Decreased safety awareness  Visit Diagnosis: Repeated falls  Unsteadiness on feet  Muscle weakness (generalized)  Other symptoms and signs involving the musculoskeletal system  Cervicalgia  Abnormal posture     Problem List Patient Active Problem List   Diagnosis Date Noted   Acute CVA (cerebrovascular accident) ([place]) 11/11/2020   Normocytic anemia 10/15/2019   Dysphagia 09/17/2019   Costochondritis 06/07/2019   Seasonal allergic rhinitis due to pollen 11/03/2018   Arthropathy of cervical facet joint 11/01/2018   Neural foraminal stenosis of cervical spine 11/01/2018   Atypical chest pain 10/24/2018   Adjustment disorder with mixed anxiety and depressed mood 09/05/2018   Insomnia 03/03/2018   S/P CABG x 4 01/10/2018   Hyperlipidemia 01/09/2018   Unstable angina (HCC) 01/05/2018   CAD (coronary artery disease), native coronary artery 12/28/2017   Asymptomatic microscopic hematuria 11/14/2017   Iron deficiency anemia 09/05/2017   Cervicalgia 12/18/2015   Benign essential HTN 04/08/2015   Metatarsalgia of right foot 02/11/2015    Percival Spanish, PT 02/10/2021, 6:11 PM  [facility] [address]  [place], [place], [address] Phone: [phone]   Fax:  [phone]  Name: Diana [name] MRN: [id number] Date of Birth: [birth year]

## 2021-02-17 ENCOUNTER — Other Ambulatory Visit: Payer: Self-pay

## 2021-02-17 ENCOUNTER — Ambulatory Visit: Payer: Medicare Other | Admitting: Physical Therapy

## 2021-02-17 ENCOUNTER — Encounter: Payer: Self-pay | Admitting: Physical Therapy

## 2021-02-17 VITALS — BP 148/72 | HR 78

## 2021-02-17 DIAGNOSIS — M6281 Muscle weakness (generalized): Secondary | ICD-10-CM

## 2021-02-17 DIAGNOSIS — M542 Cervicalgia: Secondary | ICD-10-CM

## 2021-02-17 DIAGNOSIS — R293 Abnormal posture: Secondary | ICD-10-CM

## 2021-02-17 DIAGNOSIS — R2681 Unsteadiness on feet: Secondary | ICD-10-CM

## 2021-02-17 DIAGNOSIS — R296 Repeated falls: Secondary | ICD-10-CM | POA: Diagnosis not present

## 2021-02-17 DIAGNOSIS — R29898 Other symptoms and signs involving the musculoskeletal system: Secondary | ICD-10-CM

## 2021-02-17 NOTE — Therapy (Signed)
[facility] [address]  [place], [place], [address] Phone: [phone]   Fax:  [phone]  Physical Therapy Treatment  Patient Details  Name: Diana [name] MRN: [id number] Date of Birth: Jan 31, 1956 Referring Provider (PT): Harrison Mons, PA-C (PCP) & Melrose Nakayama, MD   Encounter Date: 02/17/2021   PT End of Session - 02/17/21 1537     Visit Number 21    Number of Visits 31    Date for PT Re-Evaluation 03/17/21    Authorization Type UHC Medicare & Medicaid [place]    Progress Note Due on Visit 73   Recert completed on visit #19 - 02/03/21   PT Start Time 1537    PT Stop Time 1624    PT Time Calculation (min) 47 min    Activity Tolerance Patient tolerated treatment well    Behavior During Therapy [facility] for tasks assessed/performed             Past Medical History:  Diagnosis Date   Allergy    Arthritis    feet   Bronchitis 10/30/2016   Carpal tunnel syndrome    Coronary artery disease    GERD (gastroesophageal reflux disease)    Hyperlipidemia    Hypertension    Non-ST elevation (NSTEMI) myocardial infarction ([place]) 10/30/2016   NSTEMI (non-ST elevated myocardial infarction) ([place]) 10/30/2016    Past Surgical History:  Procedure Laterality Date   CARPAL TUNNEL RELEASE Left 02/12/2016   Procedure: LEFT CARPAL TUNNEL RELEASE;  Surgeon: Daryll Brod, MD;  Location: [place];  Service: Orthopedics;  Laterality: Left;  FAB   CORONARY ARTERY BYPASS GRAFT N/A 01/10/2018   Procedure: CORONARY ARTERY BYPASS GRAFTING (CABG) TIMES 4, USING LEFT INTERNAL MAMMARY ARTERY TO OM1  AND ENDOSCOPICALLY HARVESTED RIGHT SAPHENOUS VEIN TO DIAGONAL, AND SEQUENTIALLY TO OM2 AND DISTAL CIRCUMFLEX;  Surgeon: Grace Isaac, MD;  Location: [place];  Service: Open Heart Surgery;  Laterality: N/A;   LEFT HEART CATH AND CORONARY ANGIOGRAPHY N/A 01/05/2018   Procedure: LEFT HEART CATH AND CORONARY ANGIOGRAPHY;  Surgeon: Troy Sine, MD;  Location: [place] [facility];  Service: Cardiovascular;  Laterality: N/A;   TEE WITHOUT CARDIOVERSION N/A 01/10/2018   Procedure: TRANSESOPHAGEAL ECHOCARDIOGRAM (TEE);  Surgeon: Grace Isaac, MD;  Location: [place];  Service: Open Heart Surgery;  Laterality: N/A;   TONSILLECTOMY     TUBAL LIGATION     WRIST SURGERY Right     Vitals:   02/17/21 1546  BP: (!) 148/72  Pulse: 78  SpO2: 99%     Subjective Assessment - 02/17/21 1543     Subjective Pt reports increased L sided head, neck and shoulder/UE pain starting last night while she was sleeping - pain woke her up.    Pertinent History CABG x 4 - 01/10/18    Diagnostic tests 11/10/20 - cervical MRI: 1. No acute abnormality of the cervical spine.  2. Mild spinal canal stenosis at C3-C4 secondary to small disc bulge.  3. Moderate left C5-C6 neural foraminal stenosis.  4. Mild bilateral C4-C5 neural foraminal stenosis.    Patient Stated Goals "to reduce the muscle tightness and feel steadier on my feet"    Currently in Pain? Yes    Pain Score 7     Pain Location Neck   L side of head, shoulder and L arm   Pain Orientation Left    Pain Descriptors / Indicators Numbness    Pain Type  Chronic pain                               [facility] Adult PT Treatment/Exercise - 02/17/21 1537       Knee/Hip Exercises: Aerobic   Nustep L5 x 6 min (UE/LE)      Manual Therapy   Manual Therapy Soft tissue mobilization;Myofascial release;Taping    Manual therapy comments skilled palpation and monitoring during DN    Soft tissue mobilization STM to B cervical paraspinals, scalenes, UT/LS & scalenes (L>R) and L deltoids    Myofascial Release manual TPR to L>R UT, LS, scalenes & anterolateral deltoid; pin & stretch to L UT/LS    Kinesiotex Inhibit Muscle      Kinesiotix   Inhibit Muscle  B UT & LS - 30% stretch; 30% Y strip to L deltoids              Trigger Point Dry Needling - 02/17/21 1537     Consent Given?  Yes    Education Handout Provided Previously provided    Muscles Treated Head and Neck Upper trapezius;Levator scapulae;Scalenes   Lt   Muscles Treated Upper Quadrant Deltoid   Lt anterolateral   Upper Trapezius Response Twitch reponse elicited;Palpable increased muscle length    Levator Scapulae Response Twitch response elicited;Palpable increased muscle length    Scalenes Response Twitch reponse elicited;Palpable increased muscle length    Deltoid Response Twitch response elicited;Palpable increased muscle length                   PT Education - 02/17/21 1544     Education Details DN rational, procedure, outcomes, potential side effects, and recommended post-treatment exercises/activity    Person(s) Educated Patient    Methods Explanation   pt declined handout as she is familiar with DN from prior PT episode   Comprehension Verbalized understanding              PT Short Term Goals - 11/18/20 1536       PT SHORT TERM GOAL #1   Title Patient will be independent with initial HEP    Status Achieved   11/18/20              PT Long Term Goals - 02/03/21 1545       PT LONG TERM GOAL #1   Title Patient will be independent with ongoing/advanced HEP +/- gym program for self-management at home    Status Partially Met    Target Date 03/17/21      PT LONG TERM GOAL #2   Title Patient will demonstrate improved B LE strength to >/= 4/5 for improved stability and ease of mobility    Status Partially Met    Target Date 03/17/21      PT LONG TERM GOAL #3   Title Patient will decrease 5x STS time to </= 11.4 sec per age-appropriate norms    Baseline 23.09 sec    Status Achieved   11/25/20 - reduced to 15.9 sec; 01/13/21 - reduced to 13.44 sec; 02/03/21 - reduced to 11.37 sec - goal met     PT LONG TERM GOAL #4   Title Patient will improve FGA score to >/= 25/30 to improve gait stability and reduce risk for falls    Baseline 18/30    Status Achieved   01/13/21 - FGA =  26/30     PT LONG TERM GOAL #5   Title  Patient will report no further instances of falls    Status Achieved   02/03/21 - no recent falls reported     PT LONG TERM GOAL #6   Title Improve posture and alignment with patient to demonstrate improved upright posture with posterior shoulder girdle engaged    Status Partially Met    Target Date 03/17/21      PT LONG TERM GOAL #7   Title Decrease neck, shoulder and R knee pain by >/= 50% allowing patient increased ease of daily household tasks    Status Partially Met   02/03/21 - 98% improvement in R knee pain; 65% improvement in neck and shoulder pain but still feels that pain prevents her from doing daily chores/household tasks   Target Date 03/17/21      PT LONG TERM GOAL #8   Title Patient to improve cervical and B shoulder AROM to WFL/WNL without pain provocation    Status Partially Met    Target Date 03/17/21      PT LONG TERM GOAL  #9   TITLE Patient to report ability perform ADLs and household tasks without neck or knee pain interference    Status On-going    Target Date 03/17/21                   Plan - 02/17/21 1624     Clinical Impression Statement Pam reports increased L sided head, neck and shoulder/UE pain and "numbness" starting last night waking her from sleep. Neuro screen revealing no acute neuro changes and VS revealing only moderately elevated BP. Increased muscle tension and taut evident throughout L UT, LS, scalenes and shoulder girdle which appeared amenable to DN. After review of DN rational, procedures, outcomes and potential side effects, patient verbalized consent to DN treatment in conjunction with manual STM/DTM and TPR to reduce ttp/muscle tension. Muscles treated include L UT, scalenes, LS and lateral deltoid. DN produced normal response with good twitches elicited resulting in palpable reduction in pain/ttp and muscle tension with pt noting reduction in pain to 4/10 from 7/10. Pt educated to expect mild to  moderate muscle soreness for up to 24-48 hrs and instructed to continue prescribed home exercise program and current activity level with pt verbalizing understanding of theses instructions. Session concluded with application of RockTape to B UT/LS and L deltoids to promote further muscle relaxation/relief from muscle spasm.    Comorbidities CAD s/p NSTEMI 10/30/16 & CABG x 4 01/10/18; HTN; cervicalgia; carpal tunnel syndrome s/p CTR 02/12/16; GERD; bronchitis    Rehab Potential Good    PT Frequency 2x / week    PT Duration 6 weeks    PT Treatment/Interventions ADLs/Self Care Home Management;Cryotherapy;Electrical Stimulation;Iontophoresis 52m/ml Dexamethasone;Moist Heat;Ultrasound;Gait training;Stair training;Functional mobility training;Therapeutic activities;Therapeutic exercise;Balance training;Neuromuscular re-education;Patient/family education;Manual techniques;Scar mobilization;Passive range of motion;Dry needling;Energy conservation;Taping;Vasopneumatic Device    PT Next Visit Plan assess response to DN; progress core & proximal UE/LE strengthening as tolerated; manual therapy + potential DN as benefit noted for abnormal muscle tension; review posture & body mechanics education PRN; balance activities as indicated    PT Home Exercise Plan Access Code: [email or web address](6/7), [email or web address](6/23), [email or web address](7/12), [email or web address] (7/14, updated 7/26 & 8/22)    Consulted and Agree with Plan of Care Patient             Patient will benefit from skilled therapeutic intervention in order to improve the following deficits and impairments:  Cardiopulmonary status limiting activity, Decreased activity tolerance, Decreased mobility, Decreased range of motion,  Decreased scar mobility, Decreased strength, Hypomobility, Increased fascial restricitons, Increased muscle spasms, Impaired perceived functional ability, Impaired flexibility, Impaired UE functional use, Improper body mechanics, Postural dysfunction, Pain, Decreased  balance, Decreased endurance, Difficulty walking, Decreased safety awareness  Visit Diagnosis: Repeated falls  Unsteadiness on feet  Muscle weakness (generalized)  Other symptoms and signs involving the musculoskeletal system  Cervicalgia  Abnormal posture     Problem List Patient Active Problem List   Diagnosis Date Noted   Acute CVA (cerebrovascular accident) ([place]) 11/11/2020   Normocytic anemia 10/15/2019   Dysphagia 09/17/2019   Costochondritis 06/07/2019   Seasonal allergic rhinitis due to pollen 11/03/2018   Arthropathy of cervical facet joint 11/01/2018   Neural foraminal stenosis of cervical spine 11/01/2018   Atypical chest pain 10/24/2018   Adjustment disorder with mixed anxiety and depressed mood 09/05/2018   Insomnia 03/03/2018   S/P CABG x 4 01/10/2018   Hyperlipidemia 01/09/2018   Unstable angina (HCC) 01/05/2018   CAD (coronary artery disease), native coronary artery 12/28/2017   Asymptomatic microscopic hematuria 11/14/2017   Iron deficiency anemia 09/05/2017   Cervicalgia 12/18/2015   Benign essential HTN 04/08/2015   Metatarsalgia of right foot 02/11/2015    Percival Spanish, PT 02/17/2021, 8:27 PM  [facility] [address]  [place], [place], [address] Phone: [phone]   Fax:  [phone]  Name: Diana [name] MRN: [id number] Date of Birth: [birth year]

## 2021-03-03 ENCOUNTER — Ambulatory Visit: Payer: Medicare Other | Attending: Physician Assistant | Admitting: Physical Therapy

## 2021-03-03 ENCOUNTER — Encounter: Payer: Self-pay | Admitting: Physical Therapy

## 2021-03-03 ENCOUNTER — Other Ambulatory Visit: Payer: Self-pay

## 2021-03-03 DIAGNOSIS — R29898 Other symptoms and signs involving the musculoskeletal system: Secondary | ICD-10-CM | POA: Diagnosis present

## 2021-03-03 DIAGNOSIS — M542 Cervicalgia: Secondary | ICD-10-CM | POA: Insufficient documentation

## 2021-03-03 DIAGNOSIS — R293 Abnormal posture: Secondary | ICD-10-CM | POA: Insufficient documentation

## 2021-03-03 DIAGNOSIS — M6281 Muscle weakness (generalized): Secondary | ICD-10-CM | POA: Insufficient documentation

## 2021-03-03 DIAGNOSIS — R2681 Unsteadiness on feet: Secondary | ICD-10-CM | POA: Insufficient documentation

## 2021-03-03 DIAGNOSIS — R296 Repeated falls: Secondary | ICD-10-CM | POA: Diagnosis not present

## 2021-03-03 NOTE — Patient Instructions (Signed)
     Access Code: [email or web address] URL: [email or web address] Date: 03/03/2021 Prepared by: Glenetta Hew  Exercises Doorway Pec Stretch at 60 Degrees Abduction with Arm Straight - 2-3 x daily - 7 x weekly - 3 reps - 30 sec hold Single Arm Doorway Pec Stretch at 60 Elevation - 2-3 x daily - 7 x weekly - 3 reps - 30 sec hold Seated Gentle Upper Trapezius Stretch - 2-3 x daily - 7 x weekly - 3 reps - 30 sec hold Gentle Levator Scapulae Stretch - 2-3 x daily - 7 x weekly - 3 reps - 30 sec hold Seated Scalene Stretch with Towel - 2 x daily - 7 x weekly - 3 reps - 30 sec hold Mid-Lower Cervical Extension SNAG with Strap - 1 x daily - 7 x weekly - 2 sets - 10 reps - 3 sec hold Seated Assisted Cervical Rotation with Towel - 1 x daily - 7 x weekly - 2 sets - 10 reps - 3 sec hold Seated Shoulder Row with Anchored Resistance - 1 x daily - 7 x weekly - 2 sets - 10 reps - 3 hold hold Seated Shoulder Extension and Scapular Retraction with Resistance - 1 x daily - 7 x weekly - 2 sets - 10 reps - 3 sec hold Seated Thoracic Lumbar Extension with Pectoralis Stretch - 2-3 x daily - 7 x weekly - 2 sets - 10 reps - 3 sec hold Prone Shoulder Extension on Swiss Ball - 1 x daily - 7 x weekly - 2 sets - 10 reps - 3 sec hold Prone Middle Trapezius Strengthening on Swiss Ball - 1 x daily - 7 x weekly - 2 sets - 10 reps - 3 sec hold Prone Shoulder W on [employer] - 1 x daily - 7 x weekly - 2 sets - 10 reps - 3 sec hold Prone Lower Trapezius Strengthening on Swiss Ball - 1 x daily - 7 x weekly - 2 sets - 10 reps - 3 sec hold Seated Flexion Stretch with Swiss Ball - 2-3 x daily - 7 x weekly - 3 reps - 30 sec hold Seated Thoracic Flexion and Rotation with Swiss Ball - 2-3 x daily - 7 x weekly - 2 sets - 3 reps - 30 sec hold  Patient Education Hospital doctor

## 2021-03-03 NOTE — Therapy (Addendum)
[facility] [address]  [place], [place], [address] Phone: [phone]   Fax:  [phone]  Physical Therapy Treatment / Discharge Summary  Patient Details  Name: Diana [name] MRN: [id number] Date of Birth: December 07, 1955 Referring Provider (PT): Harrison Mons, PA-C (PCP) & Melrose Nakayama, MD   Encounter Date: 03/03/2021   PT End of Session - 03/03/21 1535     Visit Number 22    Number of Visits 31    Date for PT Re-Evaluation 03/17/21    Authorization Type UHC Medicare & Medicaid [place]    Progress Note Due on Visit 36   Recert completed on visit #19 - 02/03/21   PT Start Time 1535    PT Stop Time 1632    PT Time Calculation (min) 57 min    Activity Tolerance Patient tolerated treatment well    Behavior During Therapy [facility] for tasks assessed/performed             Past Medical History:  Diagnosis Date   Allergy    Arthritis    feet   Bronchitis 10/30/2016   Carpal tunnel syndrome    Coronary artery disease    GERD (gastroesophageal reflux disease)    Hyperlipidemia    Hypertension    Non-ST elevation (NSTEMI) myocardial infarction ([place]) 10/30/2016   NSTEMI (non-ST elevated myocardial infarction) ([place]) 10/30/2016    Past Surgical History:  Procedure Laterality Date   CARPAL TUNNEL RELEASE Left 02/12/2016   Procedure: LEFT CARPAL TUNNEL RELEASE;  Surgeon: Daryll Brod, MD;  Location: [facility];  Service: Orthopedics;  Laterality: Left;  FAB   CORONARY ARTERY BYPASS GRAFT N/A 01/10/2018   Procedure: CORONARY ARTERY BYPASS GRAFTING (CABG) TIMES 4, USING LEFT INTERNAL MAMMARY ARTERY TO OM1  AND ENDOSCOPICALLY HARVESTED RIGHT SAPHENOUS VEIN TO DIAGONAL, AND SEQUENTIALLY TO OM2 AND DISTAL CIRCUMFLEX;  Surgeon: Grace Isaac, MD;  Location: [facility];  Service: Open Heart Surgery;  Laterality: N/A;   LEFT HEART CATH AND CORONARY ANGIOGRAPHY N/A 01/05/2018   Procedure: LEFT HEART CATH AND CORONARY ANGIOGRAPHY;   Surgeon: Troy Sine, MD;  Location: [place] [facility];  Service: Cardiovascular;  Laterality: N/A;   TEE WITHOUT CARDIOVERSION N/A 01/10/2018   Procedure: TRANSESOPHAGEAL ECHOCARDIOGRAM (TEE);  Surgeon: Grace Isaac, MD;  Location: [facility];  Service: Open Heart Surgery;  Laterality: N/A;   TONSILLECTOMY     TUBAL LIGATION     WRIST SURGERY Right     There were no vitals filed for this visit.   Subjective Assessment - 03/03/21 1543     Subjective Pt reports her L shoulder is bothering her more today. She states she is scheduled for ESI on 03/10/21 but states she wants to get a second opinion.    Pertinent History CABG x 4 - 01/10/18    Diagnostic tests 11/10/20 - cervical MRI: 1. No acute abnormality of the cervical spine.  2. Mild spinal canal stenosis at C3-C4 secondary to small disc bulge.  3. Moderate left C5-C6 neural foraminal stenosis.  4. Mild bilateral C4-C5 neural foraminal stenosis.    Patient Stated Goals "to reduce the muscle tightness and feel steadier on my feet"    Currently in Pain? Yes    Pain Score 7     Pain Location Shoulder   & L side of neck   Pain Orientation Left    Pain Descriptors / Indicators Tightness;Pressure    Pain Type Chronic pain  Pain Frequency Constant    Multiple Pain Sites No                               [facility] Adult PT Treatment/Exercise - 03/03/21 1535       Lumbar Exercises: Stretches   Quadruped Mid Back Stretch 3 reps;30 seconds   2 sets   Quadruped Mid Back Stretch Limitations seated 3-way prayer stretch with ball      Knee/Hip Exercises: Aerobic   Recumbent Bike L2 x 6 min      Shoulder Exercises: Prone   Extension Both;10 reps;Strengthening    Extension Limitations I's standing leaning over orange Pball on hi-low table - cues for scap retraction while avoiding shoulder shrug    External Rotation Both;10 reps;Strengthening    External Rotation Limitations W's standing leaning over orange Pball on  hi-low table - cues for scap retraction while avoiding shoulder shrug    Horizontal ABduction 1 Both;10 reps;Strengthening    Horizontal ABduction 1 Limitations T's standing leaning over orange Pball on hi-low table - cues for scap retraction while avoiding shoulder shrug    Horizontal ABduction 2 Both;10 reps;Strengthening    Horizontal ABduction 2 Limitations Y's standing leaning over orange Pball on hi-low table - cues for scap retraction while avoiding shoulder shrug      Moist Heat Therapy   Number Minutes Moist Heat 15 Minutes    Moist Heat Location Cervical;Shoulder                     PT Education - 03/03/21 1616     Education Details HEP update - swiss ball postural exercises and stretches - Access Code: [email or web address]    Person(s) Educated Patient    Methods Explanation;Demonstration;Verbal cues;Handout    Comprehension Verbalized understanding;Verbal cues required;Returned demonstration;Need further instruction              PT Short Term Goals - 11/18/20 1536       PT SHORT TERM GOAL #1   Title Patient will be independent with initial HEP    Status Achieved   11/18/20              PT Long Term Goals - 02/03/21 1545       PT LONG TERM GOAL #1   Title Patient will be independent with ongoing/advanced HEP +/- gym program for self-management at home    Status Partially Met    Target Date 03/17/21      PT LONG TERM GOAL #2   Title Patient will demonstrate improved B LE strength to >/= 4/5 for improved stability and ease of mobility    Status Partially Met    Target Date 03/17/21      PT LONG TERM GOAL #3   Title Patient will decrease 5x STS time to </= 11.4 sec per age-appropriate norms    Baseline 23.09 sec    Status Achieved   11/25/20 - reduced to 15.9 sec; 01/13/21 - reduced to 13.44 sec; 02/03/21 - reduced to 11.37 sec - goal met     PT LONG TERM GOAL #4   Title Patient will improve FGA score to >/= 25/30 to improve gait stability and reduce risk  for falls    Baseline 18/30    Status Achieved   01/13/21 - FGA = 26/30     PT LONG TERM GOAL #5   Title Patient will report no further instances of falls  Status Achieved   02/03/21 - no recent falls reported     PT LONG TERM GOAL #6   Title Improve posture and alignment with patient to demonstrate improved upright posture with posterior shoulder girdle engaged    Status Partially Met    Target Date 03/17/21      PT LONG TERM GOAL #7   Title Decrease neck, shoulder and R knee pain by >/= 50% allowing patient increased ease of daily household tasks    Status Partially Met   02/03/21 - 98% improvement in R knee pain; 65% improvement in neck and shoulder pain but still feels that pain prevents her from doing daily chores/household tasks   Target Date 03/17/21      PT LONG TERM GOAL #8   Title Patient to improve cervical and B shoulder AROM to WFL/WNL without pain provocation    Status Partially Met    Target Date 03/17/21      PT LONG TERM GOAL  #9   TITLE Patient to report ability perform ADLs and household tasks without neck or knee pain interference    Status On-going    Target Date 03/17/21                   Plan - 03/03/21 1617     Clinical Impression Statement Pam reports worsening of her L sided neck and shoulder pain today - not sure if it is coming from doing her chores around the house or something else triggering the increased pain. She notes temporary benefit lasting a few days from recent DN but declined DN today. Therapeutic exercises targeting postural strengthening mixed with stretching to help relieve increased muscle tension - pt requesting home instructions for exercises, noting she felt better following exercises. Session concluded with moist heat at pt's request.    Comorbidities CAD s/p NSTEMI 10/30/16 & CABG x 4 01/10/18; HTN; cervicalgia; carpal tunnel syndrome s/p CTR 02/12/16; GERD; bronchitis    Rehab Potential Good    PT Frequency 2x / week    PT  Duration 6 weeks    PT Treatment/Interventions ADLs/Self Care Home Management;Cryotherapy;Electrical Stimulation;Iontophoresis 102m/ml Dexamethasone;Moist Heat;Ultrasound;Gait training;Stair training;Functional mobility training;Therapeutic activities;Therapeutic exercise;Balance training;Neuromuscular re-education;Patient/family education;Manual techniques;Scar mobilization;Passive range of motion;Dry needling;Energy conservation;Taping;Vasopneumatic Device    PT Next Visit Plan progress core & proximal UE/LE strengthening as tolerated; manual therapy + potential DN as benefit noted for abnormal muscle tension; review posture & body mechanics education PRN; balance activities as indicated    PT Home Exercise Plan Access Code: [email or web address](6/7), [email or web address](6/23), [email or web address](7/12), [email or web address] (7/14, updated 7/26, 8/22 & 10/4)    Consulted and Agree with Plan of Care Patient             Patient will benefit from skilled therapeutic intervention in order to improve the following deficits and impairments:  Cardiopulmonary status limiting activity, Decreased activity tolerance, Decreased mobility, Decreased range of motion, Decreased scar mobility, Decreased strength, Hypomobility, Increased fascial restricitons, Increased muscle spasms, Impaired perceived functional ability, Impaired flexibility, Impaired UE functional use, Improper body mechanics, Postural dysfunction, Pain, Decreased balance, Decreased endurance, Difficulty walking, Decreased safety awareness  Visit Diagnosis: Repeated falls  Unsteadiness on feet  Muscle weakness (generalized)  Other symptoms and signs involving the musculoskeletal system  Cervicalgia  Abnormal posture     Problem List Patient Active Problem List   Diagnosis Date Noted   Acute CVA (cerebrovascular accident) ([place] 11/11/2020   Normocytic anemia 10/15/2019   Dysphagia 09/17/2019   Costochondritis 06/07/2019  Seasonal allergic rhinitis due to pollen  11/03/2018   Arthropathy of cervical facet joint 11/01/2018   Neural foraminal stenosis of cervical spine 11/01/2018   Atypical chest pain 10/24/2018   Adjustment disorder with mixed anxiety and depressed mood 09/05/2018   Insomnia 03/03/2018   S/P CABG x 4 01/10/2018   Hyperlipidemia 01/09/2018   Unstable angina ([place]) 01/05/2018   CAD (coronary artery disease), native coronary artery 12/28/2017   Asymptomatic microscopic hematuria 11/14/2017   Iron deficiency anemia 09/05/2017   Cervicalgia 12/18/2015   Benign essential HTN 04/08/2015   Metatarsalgia of right foot 02/11/2015    Percival Spanish, PT 03/03/2021, 4:31 PM  [facility] [address]  [place], [place], [address] Phone: [phone]   Fax:  [phone]  Name: MARGEL JOENS MRN: [id number] Date of Birth: Apr 16, 1956   PHYSICAL THERAPY DISCHARGE SUMMARY  Visits from Start of Care: 22  Current functional level related to goals / functional outcomes:   Refer to above clinical impression for status as of last visit on 03/03/2021. Patient cancelled or no showed for next 3 scheduled visits due to pain and has not returned to PT in >30 days, therefore will proceed with discharge from PT for this episode.   Remaining deficits:   As above. Unable to formally assess status at discharge due to failure to return.   Education / Equipment:   HEP   Patient agrees to discharge. Patient goals were partially met. Patient is being discharged due to not returning since the last visit.   Percival Spanish, PT, MPT 04/20/21, 1:52 PM  [facility] [address]  [place], [place], [address] Phone: [phone]   Fax:  [phone]

## 2021-03-05 ENCOUNTER — Ambulatory Visit: Payer: Medicare Other | Admitting: Physical Therapy

## 2021-03-10 ENCOUNTER — Ambulatory Visit: Payer: Medicare Other | Admitting: Physical Therapy

## 2021-03-12 ENCOUNTER — Ambulatory Visit: Payer: Medicare Other | Admitting: Physical Therapy

## 2021-11-19 ENCOUNTER — Institutional Professional Consult (permissible substitution): Payer: Medicare Other | Admitting: Pulmonary Disease

## 2022-01-21 ENCOUNTER — Ambulatory Visit (INDEPENDENT_AMBULATORY_CARE_PROVIDER_SITE_OTHER): Payer: Medicare Other | Admitting: Pulmonary Disease

## 2022-01-21 ENCOUNTER — Encounter: Payer: Self-pay | Admitting: Pulmonary Disease

## 2022-01-21 VITALS — BP 110/60 | HR 72 | Temp 97.5°F | Ht 61.5 in | Wt 154.6 lb

## 2022-01-21 DIAGNOSIS — G4719 Other hypersomnia: Secondary | ICD-10-CM

## 2022-01-21 NOTE — Progress Notes (Signed)
Diana [name]    [id number]    [birth year]  Primary Care Physician:Jeffery, Avelino Leeds, PA  Referring Physician: Porfirio Oar, PA [address] [place],  [place] [address]  Chief complaint: Evaluation for obstructive sleep apnea  HPI:  Patient with excessive daytime sleepiness History of snoring, no witnessed apneas Nonrestorative sleep  Usually goes to bed about 9 PM, falls asleep easily, final wake up time about 5 AM May wake up about once or twice during the night  Weight has been relatively stable  Denies significant dryness of her mouth in the mornings No morning headaches No nasal stuffiness or congestion, no gasping respirations at night  Mom and brother do snore  History of hypertension, heart failure, history of CVA/TIA  Pets: Occupation: Exposures: Smoking history: Travel history: Relevant family history:  Outpatient Encounter Medications as of 01/21/2022  Medication Sig   acetaminophen (TYLENOL) 500 MG tablet Take 500 mg by mouth every 6 (six) hours as needed for headache.   aspirin 325 MG tablet Take 1 tablet (325 mg total) by mouth daily.   atorvastatin (LIPITOR) 40 MG tablet Take 40 mg by mouth daily.   cetirizine (ZYRTEC) 10 MG tablet Take 10 mg by mouth daily.   DULoxetine (CYMBALTA) 60 MG capsule Take 60 mg by mouth daily.   ferrous gluconate (FERGON) 324 MG tablet Take 2 tablets by mouth daily.    Metoprolol Tartrate 75 MG TABS Take 75 mg by mouth every 12 (twelve) hours.   montelukast (SINGULAIR) 10 MG tablet Take 10 mg by mouth at bedtime.   pantoprazole (PROTONIX) 40 MG tablet Take 40 mg by mouth 2 (two) times daily.   polyethylene glycol powder (GLYCOLAX/MIRALAX) 17 GM/SCOOP powder Take 17 g by mouth every evening.   pregabalin (LYRICA) 150 MG capsule Take 150 mg by mouth 2 (two) times daily.   vitamin C (ASCORBIC ACID) 500 MG tablet Take 500 mg by mouth daily.   Vitamin D, Cholecalciferol, 50 MCG (2000 UT) CAPS Take  2,000 Units by mouth daily.   Adhesive Tape (MUELLER KINESIOLOGY) TAPE Apply as directed to reduce muscle/joint pain (Patient not taking: Reported on 01/21/2022)   hydrOXYzine (ATARAX/VISTARIL) 10 MG tablet Take 10 mg by mouth 3 (three) times daily as needed for anxiety. (Patient not taking: Reported on 01/21/2022)   naproxen (NAPROSYN) 500 MG tablet Take 1 tablet (500 mg total) by mouth 2 (two) times daily with a meal. (Patient not taking: Reported on 01/21/2022)   nitroGLYCERIN (NITROSTAT) 0.4 MG SL tablet Place 1 tablet (0.4 mg total) under the tongue every 5 (five) minutes as needed for chest pain.   ranolazine (RANEXA) 500 MG 12 hr tablet Take 1,000 mg by mouth 2 (two) times daily. (Patient not taking: Reported on 01/21/2022)   traZODone (DESYREL) 50 MG tablet Take 25-50 mg by mouth at bedtime as needed for sleep. (Patient not taking: Reported on 01/21/2022)   [DISCONTINUED] losartan-hydrochlorothiazide (HYZAAR) 50-12.5 MG tablet Take 1 tablet by mouth daily.   No facility-administered encounter medications on file as of 01/21/2022.    Allergies as of 01/21/2022 - Review Complete 01/21/2022  Allergen Reaction Noted   Contrast media [iodinated contrast media] Anaphylaxis 12/28/2017   Penicillins Rash and Other (See Comments) 02/12/2013   Methocarbamol Nausea Only and Other (See Comments) 07/05/2019   Codeine Anxiety 02/12/2013    Past Medical History:  Diagnosis Date   Allergy    Arthritis    feet   Bronchitis  10/30/2016   Carpal tunnel syndrome    Coronary artery disease    GERD (gastroesophageal reflux disease)    Hyperlipidemia    Hypertension    Non-ST elevation (NSTEMI) myocardial infarction ([place]) 10/30/2016   NSTEMI (non-ST elevated myocardial infarction) ([place]) 10/30/2016    Past Surgical History:  Procedure Laterality Date   CARPAL TUNNEL RELEASE Left 02/12/2016   Procedure: LEFT CARPAL TUNNEL RELEASE;  Surgeon: Daryll Brod, MD;  Location: [facility];  Service:  Orthopedics;  Laterality: Left;  FAB   CORONARY ARTERY BYPASS GRAFT N/A 01/10/2018   Procedure: CORONARY ARTERY BYPASS GRAFTING (CABG) TIMES 4, USING LEFT INTERNAL MAMMARY ARTERY TO OM1  AND ENDOSCOPICALLY HARVESTED RIGHT SAPHENOUS VEIN TO DIAGONAL, AND SEQUENTIALLY TO OM2 AND DISTAL CIRCUMFLEX;  Surgeon: Grace Isaac, MD;  Location: [facility];  Service: Open Heart Surgery;  Laterality: N/A;   LEFT HEART CATH AND CORONARY ANGIOGRAPHY N/A 01/05/2018   Procedure: LEFT HEART CATH AND CORONARY ANGIOGRAPHY;  Surgeon: Troy Sine, MD;  Location: [facility];  Service: Cardiovascular;  Laterality: N/A;   TEE WITHOUT CARDIOVERSION N/A 01/10/2018   Procedure: TRANSESOPHAGEAL ECHOCARDIOGRAM (TEE);  Surgeon: Grace Isaac, MD;  Location: [facility];  Service: Open Heart Surgery;  Laterality: N/A;   TONSILLECTOMY     TUBAL LIGATION     WRIST SURGERY Right     Family History  Problem Relation Age of Onset   Thyroid disease Mother    Hypertension Mother    Brain cancer Sister    HIV Sister    Healthy Brother    Liver disease Brother     Social History   Socioeconomic History   Marital status: Single    Spouse name: Danae Chen   Number of children: 2   Years of education: 12th grade   Highest education level: Not on file  Occupational History   Occupation: Medical illustrator: [employer]  Tobacco Use   Smoking status: Never   Smokeless tobacco: Never  Vaping Use   Vaping Use: Never used  Substance and Sexual Activity   Alcohol use: No   Drug use: No   Sexual activity: Not Currently  Other Topics Concern   Not on file  Social History Narrative   Divorced from her husband. She has two adult sons from that union . One lives in [place], the other lives in [place] (as a Music therapist) following [employer].   Married Danae Chen 05/2014.   Social Determinants of Health   Financial Resource Strain: Not on file  Food Insecurity: Not on file  Transportation Needs: Not on file  Physical  Activity: Not on file  Stress: Not on file  Social Connections: Not on file  Intimate Partner Violence: Not on file    Review of Systems  Respiratory:  Positive for shortness of breath.   Psychiatric/Behavioral:  Positive for sleep disturbance.     Vitals:   01/21/22 1444  BP: 110/60  Pulse: 72  Temp: (!) 97.5 F (36.4 C)  SpO2: 97%     Physical Exam Constitutional:      Appearance: Normal appearance.  HENT:     Head: Normocephalic.     Mouth/Throat:     Comments: Mallampati 2 Eyes:     Pupils: Pupils are equal, round, and reactive to light.  Cardiovascular:     Rate and Rhythm: Normal rate and regular rhythm.     Heart sounds: No murmur heard.    No friction rub.  Pulmonary:     Effort: No respiratory distress.     Breath sounds: No stridor. No wheezing or rhonchi.  Genitourinary:    Rectum: Guaiac result positive.  Musculoskeletal:     Cervical back: No rigidity or tenderness.  Skin:    Findings: Erythema present.  Neurological:     Mental Status: She is alert.  Psychiatric:        Mood and Affect: Mood normal.       01/21/2022    2:00 PM  Results of the Epworth flowsheet  Sitting and reading 2  Watching TV 2  Sitting, inactive in a public place (e.g. a theatre or a meeting) 1  As a passenger in a car for an hour without a break 3  Lying down to rest in the afternoon when circumstances permit 2  Sitting and talking to someone 1  Sitting quietly after a lunch without alcohol 2  In a car, while stopped for a few minutes in traffic 1  Total score 14     Data Reviewed: No previous sleep study  Assessment:  We will schedule patient for home sleep study  Other options of evaluation was discussed with the patient  Weight loss efforts discussed with the patient  Pathophysiology of sleep disordered breathing discussed with patient  Plan/Recommendations: Schedule patient for home sleep study  Treatment options for sleep disordered breathing  discussed with the patient  Risk with not treating sleep disordered breathing discussed with the patient   Virl Diamond MD [facility] 01/21/2022, 3:37 PM  CC: Porfirio Oar, PA

## 2022-01-21 NOTE — Patient Instructions (Signed)
I will see you back in about 3 to 4 months  We will schedule you for home sleep study  Call us with significant concerns  Continue weight loss efforts  Sleep Apnea Sleep apnea affects breathing during sleep. It causes breathing to stop for 10 seconds or more, or to become shallow. People with sleep apnea usually snore loudly. It can also increase the risk of: Heart attack. Stroke. Being very overweight (obese). Diabetes. Heart failure. Irregular heartbeat. High blood pressure. The goal of treatment is to help you breathe normally again. What are the causes?  The most common cause of this condition is a collapsed or blocked airway. There are three kinds of sleep apnea: Obstructive sleep apnea. This is caused by a blocked or collapsed airway. Central sleep apnea. This happens when the brain does not send the right signals to the muscles that control breathing. Mixed sleep apnea. This is a combination of obstructive and central sleep apnea. What increases the risk? Being overweight. Smoking. Having a small airway. Being older. Being female. Drinking alcohol. Taking medicines to calm yourself (sedatives or tranquilizers). Having family members with the condition. Having a tongue or tonsils that are larger than normal. What are the signs or symptoms? Trouble staying asleep. Loud snoring. Headaches in the morning. Waking up gasping. Dry mouth or sore throat in the morning. Being sleepy or tired during the day. If you are sleepy or tired during the day, you may also: Not be able to focus your mind (concentrate). Forget things. Get angry a lot and have mood swings. Feel sad (depressed). Have changes in your personality. Have less interest in sex, if you are female. Be unable to have an erection, if you are female. How is this treated?  Sleeping on your side. Using a medicine to get rid of mucus in your nose (decongestant). Avoiding the use of alcohol, medicines to help you  relax, or certain pain medicines (narcotics). Losing weight, if needed. Changing your diet. Quitting smoking. Using a machine to open your airway while you sleep, such as: An oral appliance. This is a mouthpiece that shifts your lower jaw forward. A CPAP device. This device blows air through a mask when you breathe out (exhale). An EPAP device. This has valves that you put in each nostril. A BIPAP device. This device blows air through a mask when you breathe in (inhale) and breathe out. Having surgery if other treatments do not work. Follow these instructions at home: Lifestyle Make changes that your doctor recommends. Eat a healthy diet. Lose weight if needed. Avoid alcohol, medicines to help you relax, and some pain medicines. Do not smoke or use any products that contain nicotine or tobacco. If you need help quitting, ask your doctor. General instructions Take over-the-counter and prescription medicines only as told by your doctor. If you were given a machine to use while you sleep, use it only as told by your doctor. If you are having surgery, make sure to tell your doctor you have sleep apnea. You may need to bring your device with you. Keep all follow-up visits. Contact a doctor if: The machine that you were given to use during sleep bothers you or does not seem to be working. You do not get better. You get worse. Get help right away if: Your chest hurts. You have trouble breathing in enough air. You have an uncomfortable feeling in your back, arms, or stomach. You have trouble talking. One side of your body feels weak. A part  of your face is hanging down. These symptoms may be an emergency. Get help right away. Call your local emergency services (911 in the [place] Do not wait to see if the symptoms will go away. Do not drive yourself to the hospital. Summary This condition affects breathing during sleep. The most common cause is a collapsed or blocked airway. The goal of  treatment is to help you breathe normally while you sleep. This information is not intended to replace advice given to you by your health care provider. Make sure you discuss any questions you have with your health care provider. Document Revised: 12/24/2020 Document Reviewed: 04/25/2020 Elsevier Patient Education  2023 [employer].

## 2022-01-22 ENCOUNTER — Encounter: Payer: Self-pay | Admitting: Pulmonary Disease

## 2022-04-06 ENCOUNTER — Encounter: Payer: Self-pay | Admitting: Pulmonary Disease

## 2023-03-14 ENCOUNTER — Encounter: Payer: Self-pay | Admitting: Hematology and Oncology

## 2023-03-28 ENCOUNTER — Ambulatory Visit: Payer: 59 | Admitting: Physical Therapy

## 2023-03-29 ENCOUNTER — Ambulatory Visit: Payer: 59 | Admitting: Physical Therapy

## 2023-04-14 ENCOUNTER — Ambulatory Visit: Payer: 59 | Admitting: Physical Therapy

## 2023-05-02 ENCOUNTER — Other Ambulatory Visit: Payer: Self-pay

## 2023-05-02 ENCOUNTER — Encounter: Payer: Self-pay | Admitting: Physical Therapy

## 2023-05-02 ENCOUNTER — Ambulatory Visit: Payer: 59 | Attending: Physician Assistant | Admitting: Physical Therapy

## 2023-05-02 DIAGNOSIS — M5416 Radiculopathy, lumbar region: Secondary | ICD-10-CM | POA: Insufficient documentation

## 2023-05-02 DIAGNOSIS — M542 Cervicalgia: Secondary | ICD-10-CM | POA: Diagnosis present

## 2023-05-02 DIAGNOSIS — M25512 Pain in left shoulder: Secondary | ICD-10-CM | POA: Insufficient documentation

## 2023-05-02 DIAGNOSIS — M25552 Pain in left hip: Secondary | ICD-10-CM | POA: Diagnosis present

## 2023-05-02 DIAGNOSIS — M25551 Pain in right hip: Secondary | ICD-10-CM | POA: Insufficient documentation

## 2023-05-02 DIAGNOSIS — M62838 Other muscle spasm: Secondary | ICD-10-CM | POA: Diagnosis present

## 2023-05-02 DIAGNOSIS — M6281 Muscle weakness (generalized): Secondary | ICD-10-CM | POA: Diagnosis present

## 2023-05-02 DIAGNOSIS — M5459 Other low back pain: Secondary | ICD-10-CM | POA: Insufficient documentation

## 2023-05-02 DIAGNOSIS — M5412 Radiculopathy, cervical region: Secondary | ICD-10-CM | POA: Insufficient documentation
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
## 2023-05-23 ENCOUNTER — Ambulatory Visit: Payer: 59 | Admitting: Physical Therapy

## 2023-05-23 ENCOUNTER — Encounter: Payer: Self-pay | Admitting: Physical Therapy

## 2023-05-23 DIAGNOSIS — M5412 Radiculopathy, cervical region: Secondary | ICD-10-CM

## 2023-05-23 DIAGNOSIS — M6281 Muscle weakness (generalized): Secondary | ICD-10-CM

## 2023-05-23 DIAGNOSIS — M62838 Other muscle spasm: Secondary | ICD-10-CM

## 2023-05-23 DIAGNOSIS — M542 Cervicalgia: Secondary | ICD-10-CM | POA: Diagnosis not present

## 2023-05-23 DIAGNOSIS — M25512 Pain in left shoulder: Secondary | ICD-10-CM

## 2023-05-23 DIAGNOSIS — M25551 Pain in right hip: Secondary | ICD-10-CM

## 2023-05-23 DIAGNOSIS — M5416 Radiculopathy, lumbar region: Secondary | ICD-10-CM

## 2023-05-23 DIAGNOSIS — M5459 Other low back pain: Secondary | ICD-10-CM
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
## 2023-05-26 ENCOUNTER — Encounter: Payer: Self-pay | Admitting: Physical Therapy

## 2023-05-26 ENCOUNTER — Ambulatory Visit: Payer: 59 | Admitting: Physical Therapy

## 2023-05-26 DIAGNOSIS — M5412 Radiculopathy, cervical region: Secondary | ICD-10-CM

## 2023-05-26 DIAGNOSIS — M62838 Other muscle spasm: Secondary | ICD-10-CM

## 2023-05-26 DIAGNOSIS — M25551 Pain in right hip: Secondary | ICD-10-CM

## 2023-05-26 DIAGNOSIS — M6281 Muscle weakness (generalized): Secondary | ICD-10-CM

## 2023-05-26 DIAGNOSIS — M542 Cervicalgia: Secondary | ICD-10-CM | POA: Diagnosis not present

## 2023-05-26 DIAGNOSIS — M5416 Radiculopathy, lumbar region: Secondary | ICD-10-CM

## 2023-05-26 DIAGNOSIS — M25512 Pain in left shoulder: Secondary | ICD-10-CM

## 2023-05-26 DIAGNOSIS — M5459 Other low back pain: Secondary | ICD-10-CM
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
## 2023-06-02 ENCOUNTER — Encounter: Payer: Self-pay | Admitting: Physical Therapy

## 2023-06-02 ENCOUNTER — Ambulatory Visit: Payer: 59 | Attending: Physician Assistant | Admitting: Physical Therapy

## 2023-06-02 DIAGNOSIS — M542 Cervicalgia: Secondary | ICD-10-CM | POA: Diagnosis present

## 2023-06-02 DIAGNOSIS — M62838 Other muscle spasm: Secondary | ICD-10-CM | POA: Diagnosis present

## 2023-06-02 DIAGNOSIS — M5412 Radiculopathy, cervical region: Secondary | ICD-10-CM | POA: Diagnosis present

## 2023-06-02 DIAGNOSIS — M25552 Pain in left hip: Secondary | ICD-10-CM | POA: Diagnosis present

## 2023-06-02 DIAGNOSIS — M6281 Muscle weakness (generalized): Secondary | ICD-10-CM

## 2023-06-02 DIAGNOSIS — M5416 Radiculopathy, lumbar region: Secondary | ICD-10-CM

## 2023-06-02 DIAGNOSIS — M25551 Pain in right hip: Secondary | ICD-10-CM

## 2023-06-02 DIAGNOSIS — M5459 Other low back pain: Secondary | ICD-10-CM | POA: Diagnosis present

## 2023-06-02 DIAGNOSIS — M25512 Pain in left shoulder: Secondary | ICD-10-CM
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
## 2023-06-06 ENCOUNTER — Ambulatory Visit: Payer: 59 | Admitting: Physical Therapy

## 2023-06-09 ENCOUNTER — Encounter: Payer: Self-pay | Admitting: Physical Therapy

## 2023-06-09 ENCOUNTER — Ambulatory Visit: Payer: 59 | Admitting: Physical Therapy

## 2023-06-09 DIAGNOSIS — M5416 Radiculopathy, lumbar region: Secondary | ICD-10-CM

## 2023-06-09 DIAGNOSIS — M542 Cervicalgia: Secondary | ICD-10-CM

## 2023-06-09 DIAGNOSIS — M5459 Other low back pain: Secondary | ICD-10-CM

## 2023-06-09 DIAGNOSIS — M5412 Radiculopathy, cervical region: Secondary | ICD-10-CM

## 2023-06-09 DIAGNOSIS — M62838 Other muscle spasm: Secondary | ICD-10-CM

## 2023-06-09 DIAGNOSIS — M25551 Pain in right hip: Secondary | ICD-10-CM

## 2023-06-09 DIAGNOSIS — M25512 Pain in left shoulder: Secondary | ICD-10-CM

## 2023-06-09 DIAGNOSIS — M6281 Muscle weakness (generalized): Secondary | ICD-10-CM
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
## 2023-06-13 ENCOUNTER — Ambulatory Visit: Payer: 59 | Admitting: Physical Therapy

## 2023-06-13 ENCOUNTER — Encounter: Payer: Self-pay | Admitting: Physical Therapy

## 2023-06-13 DIAGNOSIS — M25551 Pain in right hip: Secondary | ICD-10-CM

## 2023-06-13 DIAGNOSIS — M542 Cervicalgia: Secondary | ICD-10-CM

## 2023-06-13 DIAGNOSIS — M5412 Radiculopathy, cervical region: Secondary | ICD-10-CM

## 2023-06-13 DIAGNOSIS — M6281 Muscle weakness (generalized): Secondary | ICD-10-CM

## 2023-06-13 DIAGNOSIS — M25512 Pain in left shoulder: Secondary | ICD-10-CM

## 2023-06-13 DIAGNOSIS — M62838 Other muscle spasm: Secondary | ICD-10-CM

## 2023-06-13 DIAGNOSIS — M5459 Other low back pain: Secondary | ICD-10-CM

## 2023-06-13 DIAGNOSIS — M5416 Radiculopathy, lumbar region: Secondary | ICD-10-CM
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
## 2023-06-15 ENCOUNTER — Encounter: Payer: Self-pay | Admitting: Hematology and Oncology

## 2023-06-16 ENCOUNTER — Ambulatory Visit: Payer: 59 | Admitting: Physical Therapy

## 2023-06-16 ENCOUNTER — Encounter: Payer: Self-pay | Admitting: Physical Therapy

## 2023-06-16 DIAGNOSIS — M542 Cervicalgia: Secondary | ICD-10-CM | POA: Diagnosis not present

## 2023-06-16 DIAGNOSIS — M25551 Pain in right hip: Secondary | ICD-10-CM

## 2023-06-16 DIAGNOSIS — M5459 Other low back pain: Secondary | ICD-10-CM

## 2023-06-16 DIAGNOSIS — M5416 Radiculopathy, lumbar region: Secondary | ICD-10-CM

## 2023-06-16 DIAGNOSIS — M25512 Pain in left shoulder: Secondary | ICD-10-CM

## 2023-06-16 DIAGNOSIS — M5412 Radiculopathy, cervical region: Secondary | ICD-10-CM

## 2023-06-16 DIAGNOSIS — M62838 Other muscle spasm: Secondary | ICD-10-CM

## 2023-06-16 DIAGNOSIS — M6281 Muscle weakness (generalized): Secondary | ICD-10-CM

## 2023-06-16 NOTE — Therapy (Signed)
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 instructions, Cryotherapy, and Moist heat  PLAN FOR NEXT SESSION: Assess response to DN; progress gentle cervical, lumbar and shoulder ROM as well as core/postural strengthening as tolerated - review & update HEP as indicated; MT +/- DN and/or modalities to address abnormal muscle tension and pain; review posture and body mechanics training as needed   Marry Guan, PT 06/16/2023, 2:39 PM

## 2023-06-20 ENCOUNTER — Encounter: Payer: Self-pay | Admitting: Physical Therapy

## 2023-06-20 ENCOUNTER — Ambulatory Visit: Payer: 59 | Admitting: Physical Therapy

## 2023-06-20 DIAGNOSIS — M25512 Pain in left shoulder: Secondary | ICD-10-CM

## 2023-06-20 DIAGNOSIS — M62838 Other muscle spasm: Secondary | ICD-10-CM

## 2023-06-20 DIAGNOSIS — M6281 Muscle weakness (generalized): Secondary | ICD-10-CM

## 2023-06-20 DIAGNOSIS — M542 Cervicalgia: Secondary | ICD-10-CM | POA: Diagnosis not present

## 2023-06-20 DIAGNOSIS — M5416 Radiculopathy, lumbar region: Secondary | ICD-10-CM

## 2023-06-20 DIAGNOSIS — M5459 Other low back pain: Secondary | ICD-10-CM

## 2023-06-20 DIAGNOSIS — M5412 Radiculopathy, cervical region: Secondary | ICD-10-CM

## 2023-06-20 DIAGNOSIS — M25551 Pain in right hip: Secondary | ICD-10-CM
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
## 2023-06-23 ENCOUNTER — Ambulatory Visit: Payer: 59 | Admitting: Physical Therapy

## 2023-06-27 ENCOUNTER — Ambulatory Visit: Payer: 59 | Admitting: Physical Therapy

## 2023-06-27 ENCOUNTER — Encounter: Payer: Self-pay | Admitting: Physical Therapy

## 2023-06-27 DIAGNOSIS — M25552 Pain in left hip: Secondary | ICD-10-CM

## 2023-06-27 DIAGNOSIS — M542 Cervicalgia: Secondary | ICD-10-CM | POA: Diagnosis not present

## 2023-06-27 DIAGNOSIS — M5416 Radiculopathy, lumbar region: Secondary | ICD-10-CM

## 2023-06-27 DIAGNOSIS — M5412 Radiculopathy, cervical region: Secondary | ICD-10-CM

## 2023-06-27 DIAGNOSIS — M6281 Muscle weakness (generalized): Secondary | ICD-10-CM

## 2023-06-27 DIAGNOSIS — M5459 Other low back pain: Secondary | ICD-10-CM

## 2023-06-27 DIAGNOSIS — M62838 Other muscle spasm: Secondary | ICD-10-CM

## 2023-06-27 DIAGNOSIS — M25512 Pain in left shoulder: Secondary | ICD-10-CM

## 2023-06-27 NOTE — Therapy (Addendum)
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 and goal assessment for status as of last visit on 06/27/2023. Patient failed to schedule any further visits following the above recert visit in >30 days, therefore will proceed with discharge from PT for this episode.     Remaining deficits: As above. Unable to formally assess status at discharge due to failure to return to PT.    Education / Equipment: HEP   Patient agrees to discharge. Patient goals were partially met. Patient is being discharged due to not returning since the last visit.  Elijah EMERSON Hidden, PT 11/22/2023, 8:24 AM  [facility] [address]  [address] [place], [place], [address] Phone: [phone]   Fax:  [phone]
